# Patient Record
Sex: Male | Born: 1950
Health system: Southern US, Community
[De-identification: ages and names within clinical notes are randomized; demographics above are authoritative.]

## PROBLEM LIST (undated history)

## (undated) DIAGNOSIS — H61102 Unspecified noninfective disorders of pinna, left ear: Secondary | ICD-10-CM

## (undated) DIAGNOSIS — N186 End stage renal disease: Secondary | ICD-10-CM

## (undated) DIAGNOSIS — Z992 Dependence on renal dialysis: Secondary | ICD-10-CM

## (undated) DIAGNOSIS — Z973 Presence of spectacles and contact lenses: Secondary | ICD-10-CM

## (undated) DIAGNOSIS — N289 Disorder of kidney and ureter, unspecified: Secondary | ICD-10-CM

## (undated) DIAGNOSIS — E785 Hyperlipidemia, unspecified: Secondary | ICD-10-CM

## (undated) DIAGNOSIS — R300 Dysuria: Secondary | ICD-10-CM

## (undated) DIAGNOSIS — N400 Enlarged prostate without lower urinary tract symptoms: Secondary | ICD-10-CM

## (undated) DIAGNOSIS — I10 Essential (primary) hypertension: Secondary | ICD-10-CM

## (undated) DIAGNOSIS — E119 Type 2 diabetes mellitus without complications: Secondary | ICD-10-CM

## (undated) DIAGNOSIS — N184 Chronic kidney disease, stage 4 (severe): Secondary | ICD-10-CM

## (undated) DIAGNOSIS — N529 Male erectile dysfunction, unspecified: Secondary | ICD-10-CM

## (undated) HISTORY — DX: Nonrheumatic aortic (valve) stenosis: I35.0

---

## 1999-01-17 ENCOUNTER — Encounter
Admission: RE | Admit: 1999-01-17 | Discharge: 1999-04-17 | Payer: Self-pay | Admitting: Physical Medicine and Rehabilitation

## 1999-06-23 ENCOUNTER — Emergency Department (HOSPITAL_COMMUNITY): Admission: EM | Admit: 1999-06-23 | Discharge: 1999-06-23 | Payer: Self-pay | Admitting: Emergency Medicine

## 2001-02-11 ENCOUNTER — Encounter: Payer: Self-pay | Admitting: Endocrinology

## 2001-02-11 ENCOUNTER — Encounter: Admission: RE | Admit: 2001-02-11 | Discharge: 2001-02-11 | Payer: Self-pay | Admitting: Endocrinology

## 2014-05-20 DIAGNOSIS — N401 Enlarged prostate with lower urinary tract symptoms: Secondary | ICD-10-CM | POA: Insufficient documentation

## 2014-05-20 HISTORY — DX: Benign prostatic hyperplasia with lower urinary tract symptoms: N40.1

## 2017-03-29 ENCOUNTER — Other Ambulatory Visit: Payer: Self-pay | Admitting: Nephrology

## 2017-03-29 DIAGNOSIS — N183 Chronic kidney disease, stage 3 unspecified: Secondary | ICD-10-CM

## 2017-05-03 ENCOUNTER — Other Ambulatory Visit: Payer: Self-pay

## 2018-04-24 ENCOUNTER — Inpatient Hospital Stay (HOSPITAL_COMMUNITY): Payer: Medicare HMO

## 2018-04-24 ENCOUNTER — Emergency Department (HOSPITAL_COMMUNITY): Payer: Medicare HMO

## 2018-04-24 ENCOUNTER — Other Ambulatory Visit: Payer: Self-pay

## 2018-04-24 ENCOUNTER — Inpatient Hospital Stay (HOSPITAL_COMMUNITY)
Admission: EM | Admit: 2018-04-24 | Discharge: 2018-05-02 | DRG: 264 | Disposition: A | Discharge status: Home Health | Payer: Medicare HMO | Attending: Internal Medicine | Admitting: Internal Medicine

## 2018-04-24 ENCOUNTER — Encounter (HOSPITAL_COMMUNITY): Payer: Self-pay | Admitting: Emergency Medicine

## 2018-04-24 DIAGNOSIS — I213 ST elevation (STEMI) myocardial infarction of unspecified site: Secondary | ICD-10-CM | POA: Diagnosis not present

## 2018-04-24 DIAGNOSIS — E785 Hyperlipidemia, unspecified: Secondary | ICD-10-CM | POA: Diagnosis present

## 2018-04-24 DIAGNOSIS — E1122 Type 2 diabetes mellitus with diabetic chronic kidney disease: Secondary | ICD-10-CM

## 2018-04-24 DIAGNOSIS — N185 Chronic kidney disease, stage 5: Secondary | ICD-10-CM | POA: Diagnosis not present

## 2018-04-24 DIAGNOSIS — M7989 Other specified soft tissue disorders: Secondary | ICD-10-CM | POA: Diagnosis present

## 2018-04-24 DIAGNOSIS — I5022 Chronic systolic (congestive) heart failure: Secondary | ICD-10-CM

## 2018-04-24 DIAGNOSIS — I132 Hypertensive heart and chronic kidney disease with heart failure and with stage 5 chronic kidney disease, or end stage renal disease: Secondary | ICD-10-CM | POA: Diagnosis not present

## 2018-04-24 DIAGNOSIS — I509 Heart failure, unspecified: Secondary | ICD-10-CM

## 2018-04-24 DIAGNOSIS — I209 Angina pectoris, unspecified: Secondary | ICD-10-CM | POA: Diagnosis present

## 2018-04-24 DIAGNOSIS — I371 Nonrheumatic pulmonary valve insufficiency: Secondary | ICD-10-CM | POA: Diagnosis not present

## 2018-04-24 DIAGNOSIS — J9601 Acute respiratory failure with hypoxia: Secondary | ICD-10-CM | POA: Diagnosis not present

## 2018-04-24 DIAGNOSIS — I5020 Unspecified systolic (congestive) heart failure: Secondary | ICD-10-CM

## 2018-04-24 DIAGNOSIS — I5043 Acute on chronic combined systolic (congestive) and diastolic (congestive) heart failure: Secondary | ICD-10-CM | POA: Diagnosis not present

## 2018-04-24 DIAGNOSIS — N189 Chronic kidney disease, unspecified: Secondary | ICD-10-CM | POA: Diagnosis not present

## 2018-04-24 DIAGNOSIS — I350 Nonrheumatic aortic (valve) stenosis: Secondary | ICD-10-CM

## 2018-04-24 DIAGNOSIS — R0603 Acute respiratory distress: Secondary | ICD-10-CM | POA: Diagnosis not present

## 2018-04-24 DIAGNOSIS — I5041 Acute combined systolic (congestive) and diastolic (congestive) heart failure: Secondary | ICD-10-CM | POA: Diagnosis not present

## 2018-04-24 DIAGNOSIS — E119 Type 2 diabetes mellitus without complications: Secondary | ICD-10-CM

## 2018-04-24 DIAGNOSIS — N529 Male erectile dysfunction, unspecified: Secondary | ICD-10-CM | POA: Diagnosis present

## 2018-04-24 DIAGNOSIS — Z794 Long term (current) use of insulin: Secondary | ICD-10-CM

## 2018-04-24 DIAGNOSIS — N184 Chronic kidney disease, stage 4 (severe): Secondary | ICD-10-CM | POA: Diagnosis not present

## 2018-04-24 DIAGNOSIS — I5021 Acute systolic (congestive) heart failure: Secondary | ICD-10-CM | POA: Diagnosis not present

## 2018-04-24 DIAGNOSIS — I110 Hypertensive heart disease with heart failure: Secondary | ICD-10-CM | POA: Diagnosis not present

## 2018-04-24 DIAGNOSIS — I429 Cardiomyopathy, unspecified: Secondary | ICD-10-CM | POA: Diagnosis present

## 2018-04-24 DIAGNOSIS — I161 Hypertensive emergency: Secondary | ICD-10-CM | POA: Diagnosis not present

## 2018-04-24 DIAGNOSIS — N179 Acute kidney failure, unspecified: Secondary | ICD-10-CM | POA: Diagnosis present

## 2018-04-24 DIAGNOSIS — I272 Pulmonary hypertension, unspecified: Secondary | ICD-10-CM | POA: Diagnosis present

## 2018-04-24 DIAGNOSIS — D638 Anemia in other chronic diseases classified elsewhere: Secondary | ICD-10-CM | POA: Diagnosis present

## 2018-04-24 DIAGNOSIS — E1129 Type 2 diabetes mellitus with other diabetic kidney complication: Secondary | ICD-10-CM | POA: Diagnosis not present

## 2018-04-24 DIAGNOSIS — IMO0001 Reserved for inherently not codable concepts without codable children: Secondary | ICD-10-CM

## 2018-04-24 DIAGNOSIS — N401 Enlarged prostate with lower urinary tract symptoms: Secondary | ICD-10-CM | POA: Diagnosis present

## 2018-04-24 DIAGNOSIS — D509 Iron deficiency anemia, unspecified: Secondary | ICD-10-CM | POA: Diagnosis present

## 2018-04-24 DIAGNOSIS — I440 Atrioventricular block, first degree: Secondary | ICD-10-CM | POA: Diagnosis present

## 2018-04-24 DIAGNOSIS — Z9981 Dependence on supplemental oxygen: Secondary | ICD-10-CM

## 2018-04-24 DIAGNOSIS — I130 Hypertensive heart and chronic kidney disease with heart failure and stage 1 through stage 4 chronic kidney disease, or unspecified chronic kidney disease: Secondary | ICD-10-CM | POA: Diagnosis not present

## 2018-04-24 DIAGNOSIS — I160 Hypertensive urgency: Secondary | ICD-10-CM | POA: Diagnosis not present

## 2018-04-24 DIAGNOSIS — I340 Nonrheumatic mitral (valve) insufficiency: Secondary | ICD-10-CM | POA: Diagnosis not present

## 2018-04-24 DIAGNOSIS — N186 End stage renal disease: Secondary | ICD-10-CM | POA: Diagnosis not present

## 2018-04-24 DIAGNOSIS — R809 Proteinuria, unspecified: Secondary | ICD-10-CM | POA: Diagnosis not present

## 2018-04-24 DIAGNOSIS — R609 Edema, unspecified: Secondary | ICD-10-CM | POA: Diagnosis not present

## 2018-04-24 DIAGNOSIS — I129 Hypertensive chronic kidney disease with stage 1 through stage 4 chronic kidney disease, or unspecified chronic kidney disease: Secondary | ICD-10-CM

## 2018-04-24 DIAGNOSIS — N2581 Secondary hyperparathyroidism of renal origin: Secondary | ICD-10-CM | POA: Diagnosis not present

## 2018-04-24 DIAGNOSIS — I2582 Chronic total occlusion of coronary artery: Secondary | ICD-10-CM | POA: Diagnosis present

## 2018-04-24 DIAGNOSIS — Z992 Dependence on renal dialysis: Secondary | ICD-10-CM | POA: Diagnosis not present

## 2018-04-24 DIAGNOSIS — Z79899 Other long term (current) drug therapy: Secondary | ICD-10-CM

## 2018-04-24 DIAGNOSIS — R0602 Shortness of breath: Secondary | ICD-10-CM | POA: Diagnosis not present

## 2018-04-24 DIAGNOSIS — R079 Chest pain, unspecified: Secondary | ICD-10-CM | POA: Diagnosis not present

## 2018-04-24 DIAGNOSIS — I5031 Acute diastolic (congestive) heart failure: Secondary | ICD-10-CM | POA: Diagnosis not present

## 2018-04-24 DIAGNOSIS — E8770 Fluid overload, unspecified: Secondary | ICD-10-CM | POA: Diagnosis not present

## 2018-04-24 DIAGNOSIS — I2510 Atherosclerotic heart disease of native coronary artery without angina pectoris: Secondary | ICD-10-CM

## 2018-04-24 DIAGNOSIS — Z7982 Long term (current) use of aspirin: Secondary | ICD-10-CM

## 2018-04-24 DIAGNOSIS — Z23 Encounter for immunization: Secondary | ICD-10-CM

## 2018-04-24 DIAGNOSIS — I10 Essential (primary) hypertension: Secondary | ICD-10-CM | POA: Diagnosis not present

## 2018-04-24 HISTORY — DX: Unspecified noninfective disorders of pinna, left ear: H61.102

## 2018-04-24 HISTORY — DX: Disorder of kidney and ureter, unspecified: N28.9

## 2018-04-24 HISTORY — DX: Dysuria: R30.0

## 2018-04-24 HISTORY — DX: Chronic systolic (congestive) heart failure: I50.22

## 2018-04-24 HISTORY — DX: Male erectile dysfunction, unspecified: N52.9

## 2018-04-24 HISTORY — DX: Essential (primary) hypertension: I10

## 2018-04-24 HISTORY — DX: Benign prostatic hyperplasia without lower urinary tract symptoms: N40.0

## 2018-04-24 HISTORY — DX: Chronic kidney disease, stage 4 (severe): N18.4

## 2018-04-24 HISTORY — DX: Hypertensive heart and chronic kidney disease with heart failure and with stage 5 chronic kidney disease, or end stage renal disease: I13.2

## 2018-04-24 HISTORY — DX: Hyperlipidemia, unspecified: E78.5

## 2018-04-24 HISTORY — DX: Long term (current) use of insulin: Z79.4

## 2018-04-24 HISTORY — DX: Type 2 diabetes mellitus without complications: E11.9

## 2018-04-24 HISTORY — DX: Type 2 diabetes mellitus with diabetic chronic kidney disease: E11.22

## 2018-04-24 LAB — CBC WITH DIFFERENTIAL/PLATELET
Abs Immature Granulocytes: 0.02 10*3/uL (ref 0.00–0.07)
Basophils Absolute: 0 10*3/uL (ref 0.0–0.1)
Basophils Relative: 1 %
Eosinophils Absolute: 0.2 10*3/uL (ref 0.0–0.5)
Eosinophils Relative: 2 %
HCT: 36.8 % — ABNORMAL LOW (ref 39.0–52.0)
Hemoglobin: 11.7 g/dL — ABNORMAL LOW (ref 13.0–17.0)
Immature Granulocytes: 0 %
Lymphocytes Relative: 14 %
Lymphs Abs: 1.2 10*3/uL (ref 0.7–4.0)
MCH: 28.2 pg (ref 26.0–34.0)
MCHC: 31.8 g/dL (ref 30.0–36.0)
MCV: 88.7 fL (ref 80.0–100.0)
Monocytes Absolute: 0.6 10*3/uL (ref 0.1–1.0)
Monocytes Relative: 8 %
Neutro Abs: 6.4 10*3/uL (ref 1.7–7.7)
Neutrophils Relative %: 75 %
Platelets: 230 10*3/uL (ref 150–400)
RBC: 4.15 MIL/uL — ABNORMAL LOW (ref 4.22–5.81)
RDW: 13.6 % (ref 11.5–15.5)
WBC: 8.4 10*3/uL (ref 4.0–10.5)
nRBC: 0 % (ref 0.0–0.2)

## 2018-04-24 LAB — COMPREHENSIVE METABOLIC PANEL
ALT: 26 U/L (ref 0–44)
AST: 25 U/L (ref 15–41)
Albumin: 2.9 g/dL — ABNORMAL LOW (ref 3.5–5.0)
Alkaline Phosphatase: 86 U/L (ref 38–126)
Anion gap: 9 (ref 5–15)
BUN: 52 mg/dL — ABNORMAL HIGH (ref 8–23)
CO2: 21 mmol/L — ABNORMAL LOW (ref 22–32)
Calcium: 7.7 mg/dL — ABNORMAL LOW (ref 8.9–10.3)
Chloride: 113 mmol/L — ABNORMAL HIGH (ref 98–111)
Creatinine, Ser: 6.76 mg/dL — ABNORMAL HIGH (ref 0.61–1.24)
GFR calc Af Amer: 9 mL/min — ABNORMAL LOW (ref >60)
GFR calc non Af Amer: 8 mL/min — ABNORMAL LOW (ref >60)
Glucose, Bld: 86 mg/dL (ref 70–99)
Potassium: 3.7 mmol/L (ref 3.5–5.1)
Sodium: 143 mmol/L (ref 135–145)
Total Bilirubin: 0.4 mg/dL (ref 0.3–1.2)
Total Protein: 5.5 g/dL — ABNORMAL LOW (ref 6.5–8.1)

## 2018-04-24 LAB — CREATININE, URINE, RANDOM: Creatinine, Urine: 116.81 mg/dL

## 2018-04-24 LAB — URINALYSIS, ROUTINE W REFLEX MICROSCOPIC
Bilirubin Urine: NEGATIVE
Glucose, UA: 50 mg/dL — AB
Ketones, ur: NEGATIVE mg/dL
Leukocytes,Ua: NEGATIVE
Nitrite: NEGATIVE
Protein, ur: >=300 mg/dL — AB
Specific Gravity, Urine: 1.012 (ref 1.005–1.030)
pH: 5 (ref 5.0–8.0)

## 2018-04-24 LAB — I-STAT TROPONIN, ED: Troponin i, poc: 0.05 ng/mL (ref 0.00–0.08)

## 2018-04-24 LAB — GLUCOSE, CAPILLARY: Glucose-Capillary: 143 mg/dL — ABNORMAL HIGH (ref 70–99)

## 2018-04-24 LAB — TROPONIN I
Troponin I: 0.05 ng/mL (ref <0.03)
Troponin I: 0.06 ng/mL (ref <0.03)

## 2018-04-24 LAB — CBG MONITORING, ED: Glucose-Capillary: 83 mg/dL (ref 70–99)

## 2018-04-24 LAB — SODIUM, URINE, RANDOM: Sodium, Ur: 71 mmol/L

## 2018-04-24 LAB — MRSA PCR SCREENING: MRSA by PCR: NEGATIVE

## 2018-04-24 LAB — BRAIN NATRIURETIC PEPTIDE: B Natriuretic Peptide: 1674.1 pg/mL — ABNORMAL HIGH (ref 0.0–100.0)

## 2018-04-24 LAB — TSH: TSH: 3.155 u[IU]/mL (ref 0.350–4.500)

## 2018-04-24 MED ORDER — INSULIN ASPART 100 UNIT/ML ~~LOC~~ SOLN: 0.0000 [IU] | Freq: Three times a day (TID) | SUBCUTANEOUS | Status: DC

## 2018-04-24 MED ORDER — PNEUMOCOCCAL VAC POLYVALENT 25 MCG/0.5ML IJ INJ
0.5000 mL | INJECTION | INTRAMUSCULAR | Status: AC
  Administered 2018-04-25: 0.5 mL via INTRAMUSCULAR
  Filled 2018-04-24: qty 0.5

## 2018-04-24 MED ORDER — NITROGLYCERIN 0.4 MG SL SUBL
0.4000 mg | SUBLINGUAL_TABLET | Freq: Once | SUBLINGUAL | Status: AC
  Administered 2018-04-24: 0.4 mg via SUBLINGUAL

## 2018-04-24 MED ORDER — HYDRALAZINE HCL 20 MG/ML IJ SOLN
10.0000 mg | Freq: Once | INTRAMUSCULAR | Status: AC
  Administered 2018-04-24: 10 mg via INTRAVENOUS
  Filled 2018-04-24: qty 1

## 2018-04-24 MED ORDER — NITROGLYCERIN 0.4 MG SL SUBL
SUBLINGUAL_TABLET | SUBLINGUAL | Status: AC
  Filled 2018-04-24: qty 3

## 2018-04-24 MED ORDER — HEPARIN SODIUM (PORCINE) 5000 UNIT/ML IJ SOLN
5000.0000 [IU] | Freq: Three times a day (TID) | INTRAMUSCULAR | Status: DC
  Administered 2018-04-24 – 2018-04-28 (×10): 5000 [IU] via SUBCUTANEOUS
  Filled 2018-04-24 (×10): qty 1

## 2018-04-24 MED ORDER — ISOSORB DINITRATE-HYDRALAZINE 20-37.5 MG PO TABS
1.0000 | ORAL_TABLET | Freq: Three times a day (TID) | ORAL | Status: DC
  Administered 2018-04-24 – 2018-05-02 (×21): 1 via ORAL
  Filled 2018-04-24 (×21): qty 1

## 2018-04-24 MED ORDER — LISINOPRIL 10 MG PO TABS
10.0000 mg | ORAL_TABLET | Freq: Once | ORAL | Status: AC
  Administered 2018-04-24: 10 mg via ORAL
  Filled 2018-04-24: qty 1

## 2018-04-24 MED ORDER — SODIUM CHLORIDE 0.9 % IV SOLN
250.0000 mL | INTRAVENOUS | Status: DC | PRN
  Administered 2018-04-30: 250 mL via INTRAVENOUS

## 2018-04-24 MED ORDER — INSULIN ASPART 100 UNIT/ML ~~LOC~~ SOLN
0.0000 [IU] | Freq: Three times a day (TID) | SUBCUTANEOUS | Status: DC
  Administered 2018-04-26: 1 [IU] via SUBCUTANEOUS
  Administered 2018-04-27: 2 [IU] via SUBCUTANEOUS
  Administered 2018-04-27 – 2018-05-01 (×3): 1 [IU] via SUBCUTANEOUS

## 2018-04-24 MED ORDER — HYDRALAZINE HCL 20 MG/ML IJ SOLN
10.0000 mg | INTRAMUSCULAR | Status: DC | PRN
  Administered 2018-04-24: 10 mg via INTRAVENOUS
  Filled 2018-04-24: qty 1

## 2018-04-24 MED ORDER — NITROGLYCERIN 0.4 MG SL SUBL
0.8000 mg | SUBLINGUAL_TABLET | Freq: Once | SUBLINGUAL | Status: AC
  Administered 2018-04-24: 0.8 mg via SUBLINGUAL

## 2018-04-24 MED ORDER — ONDANSETRON HCL 4 MG/2ML IJ SOLN: 4.0000 mg | Freq: Four times a day (QID) | INTRAMUSCULAR | Status: DC | PRN

## 2018-04-24 MED ORDER — SODIUM CHLORIDE 0.9% FLUSH
3.0000 mL | Freq: Two times a day (BID) | INTRAVENOUS | Status: DC
  Administered 2018-04-24 – 2018-05-01 (×12): 3 mL via INTRAVENOUS

## 2018-04-24 MED ORDER — LIVING BETTER WITH HEART FAILURE BOOK: Freq: Once | Status: DC

## 2018-04-24 MED ORDER — SODIUM CHLORIDE 0.9% FLUSH
3.0000 mL | INTRAVENOUS | Status: DC | PRN
  Administered 2018-04-24: 3 mL via INTRAVENOUS
  Filled 2018-04-24: qty 3

## 2018-04-24 MED ORDER — INSULIN ASPART 100 UNIT/ML ~~LOC~~ SOLN: 0.0000 [IU] | Freq: Every day | SUBCUTANEOUS | Status: DC

## 2018-04-24 MED ORDER — INSULIN ASPART PROT & ASPART (70-30 MIX) 100 UNIT/ML ~~LOC~~ SUSP
20.0000 [IU] | Freq: Every day | SUBCUTANEOUS | Status: DC
  Administered 2018-04-25 – 2018-05-02 (×5): 20 [IU] via SUBCUTANEOUS
  Filled 2018-04-24 (×3): qty 10

## 2018-04-24 MED ORDER — ROSUVASTATIN CALCIUM 5 MG PO TABS: 5.0000 mg | ORAL_TABLET | Freq: Every day | ORAL | Status: DC

## 2018-04-24 MED ORDER — ACETAMINOPHEN 325 MG PO TABS
650.0000 mg | ORAL_TABLET | ORAL | Status: DC | PRN
  Administered 2018-04-29: 650 mg via ORAL
  Filled 2018-04-24: qty 2

## 2018-04-24 MED ORDER — FUROSEMIDE 10 MG/ML IJ SOLN
40.0000 mg | Freq: Two times a day (BID) | INTRAMUSCULAR | Status: DC
  Administered 2018-04-24 – 2018-04-28 (×8): 40 mg via INTRAVENOUS
  Filled 2018-04-24 (×8): qty 4

## 2018-04-24 NOTE — Plan of Care (Signed)

## 2018-04-24 NOTE — H&P (Signed)
History and Physical    SHANKAR SILBER GLO:[id number] DOB: Mar 14, 1950 DOA: 04/24/2018  Referring MD/NP/PA: Benedetto Goad, PA-C PCP: Reynold Bowen, MD  Patient coming from: PCP of  Chief Complaint: Shortness of breath  I have personally briefly reviewed patient's old medical records in [place]   HPI: Justin [name] is a 68 y.o. male with medical history significant of HTN, CKD stage IV, insulin-dependent diabetes mellitus type 2; who presents with complaints of progressively worsening shortness of breath over the last week.  He reports that his weight is been stable, but in the last 2 days he is noticed his lower legs have been swelling.  Endorses shortness of breath worsening with exertion or trying to lie flat.  Patient is followed by nephrology, but does not know the name.  Blood pressure medications include lisinopril.  He has not been on any diuretics.  Other associated symptoms include complaints of chest discomfort worsened with activity.  Works as a Psychologist, sport and exercise.  Denies change in his weight, nausea, vomiting, diarrhea, fever, chills, history of heart failure, or any known recent sick contacts.  Patient notes previously being told about his kidney function for home he has been seen by nephrologist, but cannot recall their name.    ED Course: While in the emergency department patient was noted to have blood pressures elevated to 214/99 with tachypnea.  He was placed on 4 L nasal cannula oxygen you with some improvement respiratory distress.  Given nitroglycerin, lisinopril 10 mg, and hydralazine IV with little improvement in pressures.  Chest x-ray showing interstitial edema and pleural effusions consistent with congestive heart failure.  BNP elevated at 1674.1, creatinine 6.76(baseline previously 3.1).  TRH called to admit.  Review of Systems  Constitutional: Negative for fever and weight loss.  HENT: Negative for congestion and sore throat.   Eyes: Negative for photophobia and pain.   Respiratory: Positive for shortness of breath. Negative for hemoptysis.   Cardiovascular: Positive for chest pain, orthopnea and leg swelling.  Gastrointestinal: Negative for abdominal pain, diarrhea, nausea and vomiting.  Genitourinary: Negative for dysuria and hematuria.  Musculoskeletal: Negative for falls and myalgias.  Skin: Negative for itching.  Neurological: Negative for focal weakness and loss of consciousness.  Psychiatric/Behavioral: Negative for memory loss and substance abuse.    Past Medical History:  Diagnosis Date  . BPH (benign prostatic hyperplasia)   . Chronic kidney disease (CKD), stage IV (severe) ([place])   . Diabetes ([place])   . Dysuria   . Erectile dysfunction   . Hyperlipemia   . Hypertension   . Pinna disorder, left   . Renal disorder     History reviewed. No pertinent surgical history.   reports that he has never smoked. He has never used smokeless tobacco. He reports previous alcohol use. He reports that he does not use drugs.  No Known Allergies  History reviewed. No pertinent family history.  Prior to Admission medications   Not on File    Physical Exam:  Constitutional: Obese male who appears to be in some respiratory distress Vitals:   04/24/18 1530 04/24/18 1533 04/24/18 1538 04/24/18 1540  BP: (!) 210/113 (!) 180/101 (!) 180/101 (!) 180/101  Pulse: 94 90    Resp: (!) 30 (!) 27    Temp:      TempSrc:      SpO2: 100% 100%    Weight:      Height:       Eyes: PERRL, lids and conjunctivae normal ENMT: Mucous  membranes are moist. Posterior pharynx clear of any exudate or lesions.Normal dentition.  Neck: normal, supple, no masses, no thyromegaly.  Positive JVD Respiratory: Tachypneic with positive crackles heard throughout both lung fields.  Patient on 4 L nasal cannula oxygen Cardiovascular: Regular rate and rhythm, no murmurs / rubs / gallops.  2+ pitting bilateral lower extremity edema. 2+ pedal pulses. No carotid bruits.  Abdomen: no  tenderness, no masses palpated. No hepatosplenomegaly. Bowel sounds positive.  Musculoskeletal: no clubbing / cyanosis. No joint deformity upper and lower extremities. Good ROM, no contractures. Normal muscle tone.  Skin: no rashes, lesions, ulcers. No induration Neurologic: CN 2-12 grossly intact. Sensation intact, DTR normal. Strength 5/5 in all 4.  Psychiatric: Normal judgment and insight. Alert and oriented x 3. Normal mood.     Labs on Admission: I have personally reviewed following labs and imaging studies  CBC: Recent Labs  Lab 04/24/18 1433  WBC 8.4  NEUTROABS 6.4  HGB 11.7*  HCT 36.8*  MCV 88.7  PLT 270   Basic Metabolic Panel: Recent Labs  Lab 04/24/18 1433  NA 143  K 3.7  CL 113*  CO2 21*  GLUCOSE 86  BUN 52*  CREATININE 6.76*  CALCIUM 7.7*   GFR: Estimated Creatinine Clearance: 11.5 mL/min (A) (by C-G formula based on SCr of 6.76 mg/dL (H)). Liver Function Tests: Recent Labs  Lab 04/24/18 1433  AST 25  ALT 26  ALKPHOS 86  BILITOT 0.4  PROT 5.5*  ALBUMIN 2.9*   No results for input(s): LIPASE, AMYLASE in the last 168 hours. No results for input(s): AMMONIA in the last 168 hours. Coagulation Profile: No results for input(s): INR, PROTIME in the last 168 hours. Cardiac Enzymes: No results for input(s): CKTOTAL, CKMB, CKMBINDEX, TROPONINI in the last 168 hours. BNP (last 3 results) No results for input(s): PROBNP in the last 8760 hours. HbA1C: No results for input(s): HGBA1C in the last 72 hours. CBG: Recent Labs  Lab 04/24/18 1418  GLUCAP 83   Lipid Profile: No results for input(s): CHOL, HDL, LDLCALC, TRIG, CHOLHDL, LDLDIRECT in the last 72 hours. Thyroid Function Tests: No results for input(s): TSH, T4TOTAL, FREET4, T3FREE, THYROIDAB in the last 72 hours. Anemia Panel: No results for input(s): VITAMINB12, FOLATE, FERRITIN, TIBC, IRON, RETICCTPCT in the last 72 hours. Urine analysis: No results found for: COLORURINE, APPEARANCEUR,  LABSPEC, PHURINE, GLUCOSEU, HGBUR, BILIRUBINUR, KETONESUR, PROTEINUR, UROBILINOGEN, NITRITE, LEUKOCYTESUR Sepsis Labs: No results found for this or any previous visit (from the past 240 hour(s)).   Radiological Exams on Admission: Dg Chest Port 1 View  Result Date: 04/24/2018 CLINICAL DATA:  Chest pain and shortness of breath. Bilateral lower extremity swelling. EXAM: PORTABLE CHEST 1 VIEW COMPARISON:  None. FINDINGS: Mild cardiomegaly. Atherosclerotic calcification of the aortic arch. Pulmonary vascular congestion and diffuse interstitial thickening. Small right greater than left pleural effusions and right greater than left basilar atelectasis. No pneumothorax. No acute osseous abnormality. IMPRESSION: 1. Cardiomegaly with mild interstitial pulmonary edema and small bilateral pleural effusions, consistent with congestive heart failure. 2.  Aortic atherosclerosis (ICD10-I70.0). Electronically Signed   By: Titus Dubin M.D.   On: 04/24/2018 14:57    EKG: Independently reviewed.  Sinus atrial rhythm with QTc 478  Assessment/Plan Acute respiratory distress 2/2 CHF exacerbation: Presents with complaints of shortness of breath and lower extremity swelling over the last week.  Patient with 2+ pitting lower extremity edema and crackles on lung exam.  BNP elevated at 1674 .1 and chest x-ray showing interstitial edema.  Patient with no prior history of heart disease. - Admit to a telemetry bed - Heart failure orders set  initiated  - Continuous pulse oximetry with nasal cannula oxygen as needed to keep O2 saturations >92% -Strict I&Os and daily weights -Elevate lower extremities -Check TSH -Lasix 40 mg IV Bid -Reassess in a.m. and adjust diuresis as needed. -Check echocardiogram -Place Foley catheter for strict intake and output -May warrant consultation to cardiology in a.m.   Hypertensive urgency: On admission patient's blood pressure elevated 210/113.  Patient had been given nitroglycerin and  lisinopril in the ED.   -Hold lisinopril due to acute kidney injury -Hydralazine IV as needed -Nephrology recommended starting isosorbide -hydralazine 20-37.5 mg 3 times daily  Acute renal failure superimposed on chronic kidney disease stage IV: Previous noted to have creatinine around 3.1, but presents with creatinine 6.76 with BUN 32.  Suspect acute rise could be related with hypoperfusion related with heart failure. -Check urinalysis -Check FeNa -Check Renal ultrasound -Daily BMP -Appreciate nephrology consultative services -Follow-up protein /creatinine ratio  Insulin-dependent diabetes mellitus: Patient normally takes Novolin 70/30 insulin 40 units every morning. -Hypoglycemic protocols -Check hemoglobin A1c -Reduced 70/30 insulin to 20 units daily while in hospital -CBGs q. before meals with sensitive sliding scale insulin -Adjust regimen as needed  Hyperlipidemia -Check lipid panel -Continue Crestor   DVT prophylaxis: heparin   Code Status: full  Family Communication: No family present at bedside Disposition Plan: likely discharge home in 2-3 days  Consults called:  Admission status: inpatient   Norval Morton MD [facility] Pager [phone]   If 7PM-7AM, please contact night-coverage [email or web address] Password [email or web address]  04/24/2018, 4:10 PM

## 2018-04-24 NOTE — ED Triage Notes (Addendum)
Pt arrives to ED from his MD's office with complaints of dyspnea on exertion and dyspnea laying flat for the past three to four days. EMS reports pt started having chest pains this week that wakes up him at night. Pt states hes currently pain free.

## 2018-04-24 NOTE — ED Notes (Signed)
ED TO INPATIENT HANDOFF REPORT  ED Nurse Name and Phone #: Ardelle Park RN   S Name/Age/Gender Justin [name] 68 y.o. male Room/Bed: 031C/031C  Code Status   Code Status: Full Code  Home/SNF/Other Home Patient oriented to: situation Is this baseline? Yes   Triage Complete: Triage complete  Chief Complaint SOB; CHF  Triage Note Pt arrives to ED from his MD's office with complaints of dyspnea on exertion and dyspnea laying flat for the past three to four days. EMS reports pt started having chest pains this week that wakes up him at night. Pt states hes currently pain free.    Allergies No Known Allergies  Level of Care/Admitting Diagnosis ED Disposition    ED Disposition Condition Comment   Admit  Hospital Area: [facility] [[id number]]  Level of Care: Progressive [102]  Covid Evaluation: N/A  Diagnosis: Acute exacerbation of CHF (congestive heart failure) [facility]) [[id number]]  Admitting Physician: Norval Morton [[id number]]  Attending Physician: Norval Morton [[id number]]  Estimated length of stay: 3 - 4 days  Certification:: I certify this patient will need inpatient services for at least 2 midnights  PT Class (Do Not Modify): Inpatient [101]  PT Acc Code (Do Not Modify): Private [1]       B Medical/Surgery History Past Medical History:  Diagnosis Date  . BPH (benign prostatic hyperplasia)   . Chronic kidney disease (CKD), stage IV (severe) ([place])   . Diabetes ([place])   . Dysuria   . Erectile dysfunction   . Hyperlipemia   . Hypertension   . Pinna disorder, left   . Renal disorder    History reviewed. No pertinent surgical history.   A IV Location/Drains/Wounds Patient Lines/Drains/Airways Status   Active Line/Drains/Airways    Name:   Placement date:   Placement time:   Site:   Days:   Peripheral IV 04/24/18 Left Antecubital   04/24/18    1428    Antecubital   less than 1          Intake/Output Last 24 hours No intake or output data in  the 24 hours ending 04/24/18 1653  Labs/Imaging Results for orders placed or performed during the hospital encounter of 04/24/18 (from the past 48 hour(s))  CBG monitoring, ED     Status: None   Collection Time: 04/24/18  2:18 PM  Result Value Ref Range   Glucose-Capillary 83 70 - 99 mg/dL   Comment 1 Notify RN    Comment 2 Document in Chart   Comprehensive metabolic panel     Status: Abnormal   Collection Time: 04/24/18  2:33 PM  Result Value Ref Range   Sodium 143 135 - 145 mmol/L   Potassium 3.7 3.5 - 5.1 mmol/L   Chloride 113 (H) 98 - 111 mmol/L   CO2 21 (L) 22 - 32 mmol/L   Glucose, Bld 86 70 - 99 mg/dL   BUN 52 (H) 8 - 23 mg/dL   Creatinine, Ser 6.76 (H) 0.61 - 1.24 mg/dL   Calcium 7.7 (L) 8.9 - 10.3 mg/dL   Total Protein 5.5 (L) 6.5 - 8.1 g/dL   Albumin 2.9 (L) 3.5 - 5.0 g/dL   AST 25 15 - 41 U/L   ALT 26 0 - 44 U/L   Alkaline Phosphatase 86 38 - 126 U/L   Total Bilirubin 0.4 0.3 - 1.2 mg/dL   GFR calc non Af Amer 8 (L) >60 mL/min   GFR calc Af Amer 9 (L) >60  mL/min   Anion gap 9 5 - 15    Comment: Performed at [facility] [address] [place], [place] [address]  CBC with Differential     Status: Abnormal   Collection Time: 04/24/18  2:33 PM  Result Value Ref Range   WBC 8.4 4.0 - 10.5 K/uL   RBC 4.15 (L) 4.22 - 5.81 MIL/uL   Hemoglobin 11.7 (L) 13.0 - 17.0 g/dL   HCT 36.8 (L) 39.0 - 52.0 %   MCV 88.7 80.0 - 100.0 fL   MCH 28.2 26.0 - 34.0 pg   MCHC 31.8 30.0 - 36.0 g/dL   RDW 13.6 11.5 - 15.5 %   Platelets 230 150 - 400 K/uL   nRBC 0.0 0.0 - 0.2 %   Neutrophils Relative % 75 %   Neutro Abs 6.4 1.7 - 7.7 K/uL   Lymphocytes Relative 14 %   Lymphs Abs 1.2 0.7 - 4.0 K/uL   Monocytes Relative 8 %   Monocytes Absolute 0.6 0.1 - 1.0 K/uL   Eosinophils Relative 2 %   Eosinophils Absolute 0.2 0.0 - 0.5 K/uL   Basophils Relative 1 %   Basophils Absolute 0.0 0.0 - 0.1 K/uL   Immature Granulocytes 0 %   Abs Immature Granulocytes 0.02 0.00 - 0.07 K/uL     Comment: Performed at [facility] [address], [place], [place] [address]  Brain natriuretic peptide     Status: Abnormal   Collection Time: 04/24/18  2:34 PM  Result Value Ref Range   B Natriuretic Peptide 1,674.1 (H) 0.0 - 100.0 pg/mL    Comment: Performed at [facility] [address], [place], [place] [address]  I-stat troponin, ED     Status: None   Collection Time: 04/24/18  2:55 PM  Result Value Ref Range   Troponin i, poc 0.05 0.00 - 0.08 ng/mL   Comment 3            Comment: Due to the release kinetics of cTnI, a negative result within the first hours of the onset of symptoms does not rule out myocardial infarction with certainty. If myocardial infarction is still suspected, repeat the test at appropriate intervals.    Dg Chest Port 1 View  Result Date: 04/24/2018 CLINICAL DATA:  Chest pain and shortness of breath. Bilateral lower extremity swelling. EXAM: PORTABLE CHEST 1 VIEW COMPARISON:  None. FINDINGS: Mild cardiomegaly. Atherosclerotic calcification of the aortic arch. Pulmonary vascular congestion and diffuse interstitial thickening. Small right greater than left pleural effusions and right greater than left basilar atelectasis. No pneumothorax. No acute osseous abnormality. IMPRESSION: 1. Cardiomegaly with mild interstitial pulmonary edema and small bilateral pleural effusions, consistent with congestive heart failure. 2.  Aortic atherosclerosis (ICD10-I70.0). Electronically Signed   By: Titus Dubin M.D.   On: 04/24/2018 14:57    Pending Labs Unresulted Labs (From admission, onward)    Start     Ordered   04/25/18 7703  Basic metabolic panel  Daily,   R     04/24/18 1631   04/25/18 0500  CBC WITH DIFFERENTIAL  Tomorrow morning,   R     04/24/18 1631   04/24/18 1635  Troponin I - Now Then Q6H  Now then every 6 hours,   R     04/24/18 1635   04/24/18 1631  Creatinine, urine, random  ONCE - STAT,   R     04/24/18 1631   04/24/18 1630  Sodium,  urine,  random  ONCE - STAT,   R     04/24/18 1631   04/24/18 1629  TSH  Add-on,   R     04/24/18 1631   04/24/18 1626  HIV antibody (Routine Testing)  Once,   R     04/24/18 1631          Vitals/Pain Today's Vitals   04/24/18 1533 04/24/18 1538 04/24/18 1540 04/24/18 1543  BP: (!) 180/101 (!) 180/101 (!) 180/101   Pulse: 90     Resp: (!) 27     Temp:      TempSrc:      SpO2: 100%     Weight:      Height:      PainSc:    0-No pain    Isolation Precautions No active isolations  Medications Medications  nitroGLYCERIN (NITROSTAT) 0.4 MG SL tablet (has no administration in time range)  furosemide (LASIX) injection 40 mg (has no administration in time range)  sodium chloride flush (NS) 0.9 % injection 3 mL (has no administration in time range)  sodium chloride flush (NS) 0.9 % injection 3 mL (has no administration in time range)  0.9 %  sodium chloride infusion (has no administration in time range)  acetaminophen (TYLENOL) tablet 650 mg (has no administration in time range)  ondansetron (ZOFRAN) injection 4 mg (has no administration in time range)  heparin injection 5,000 Units (has no administration in time range)  nitroGLYCERIN (NITROSTAT) SL tablet 0.8 mg (0.8 mg Sublingual Given 04/24/18 1529)  hydrALAZINE (APRESOLINE) injection 10 mg (10 mg Intravenous Given 04/24/18 1538)  lisinopril (ZESTRIL) tablet 10 mg (10 mg Oral Given 04/24/18 1540)  nitroGLYCERIN (NITROSTAT) SL tablet 0.4 mg (0.4 mg Sublingual Given 04/24/18 1534)    Mobility walks Low fall risk   Focused Assessments Respiratory   R Recommendations: See Admitting Provider Note  Report given to:   Additional Notes: Pt has extreme shortness of breath with any exertion. Pt stood up to urinate and had a hard time calming back down.

## 2018-04-24 NOTE — ED Notes (Signed)
Pt CBG was 83, notified [place](RN)

## 2018-04-24 NOTE — ED Provider Notes (Signed)
[facility] EMERGENCY DEPARTMENT Provider Note   CSN: [id number] Arrival date & time: 04/24/18  1409    History   Chief Complaint Chief Complaint  Patient presents with  . Shortness of Breath  . Chest Pain    HPI Justin [name] is a 68 y.o. male.     Justin [name] is a 68 y.o. male with a history of hypertension, hyperlipidemia, diabetes, CKD and BPH, who presents to the emergency department from his primary care doctor's office for evaluation of 1 week of exertional dyspnea and chest pain.  He reports that when he is up moving around and lifting things, [place] works [place].  Over the past week he has noticed that he has become increasingly short of breath and will intermittently develop pain across the center of his chest.  When he sits down and rests this seems to improve.  He is also noted over the past 4 days swelling in both of his lower extremities.  He has had an occasional cough which is fairly chronic for him, but has not had any sputum production.  He has not had any fevers or chills.  No nasal congestion, sore throat or rhinorrhea.  He reports that he takes all of his medications regularly as he supposed to, and took lisinopril for his blood pressure this morning, was noted to be hypertensive at 201/106 on arrival.  He denies history of any heart problems, no previous MI, has never seen a cardiologist.  He has not had any nominal pain nausea vomiting or diarrhea.  Reports some urinary frequency, primarily at night but no other urinary symptoms.  Denies headaches, numbness weakness, facial asymmetry or changes in speech.  No dizziness.  No recent sick contacts or travel.     Past Medical History:  Diagnosis Date  . BPH (benign prostatic hyperplasia)   . Chronic kidney disease (CKD), stage IV (severe) ([place])   . Diabetes ([place])   . Dysuria   . Erectile dysfunction   . Hyperlipemia   . Hypertension   . Pinna disorder, left   . Renal disorder      There are no active problems to display for this patient.   History reviewed. No pertinent surgical history.      Home Medications    Prior to Admission medications   Not on File    Family History History reviewed. No pertinent family history.  Social History Social History   Tobacco Use  . Smoking status: Never Smoker  . Smokeless tobacco: Never Used  Substance Use Topics  . Alcohol use: Not Currently  . Drug use: Never     Allergies   Patient has no known allergies.   Review of Systems Review of Systems  Constitutional: Negative for chills and fever.  HENT: Negative.   Eyes: Negative for visual disturbance.  Respiratory: Positive for shortness of breath. Negative for cough and chest tightness.   Cardiovascular: Positive for chest pain and leg swelling. Negative for palpitations.  Gastrointestinal: Negative for abdominal pain, nausea and vomiting.  Genitourinary: Positive for frequency. Negative for dysuria.  Musculoskeletal: Negative for arthralgias and joint swelling.  Skin: Negative for color change and rash.  Neurological: Negative for dizziness, syncope, weakness, light-headedness, numbness and headaches.     Physical Exam Updated Vital Signs BP (!) 201/106 (BP Location: Right Arm)   Pulse 74   Temp 98.1 F (36.7 C) (Oral)   Resp (!) 30   Ht 5\' 9"  (  1.753 m)   Wt 85.3 kg   SpO2 100%   BMI 27.76 kg/m   Physical Exam Vitals signs and nursing note reviewed.  Constitutional:      General: He is not in acute distress.    Appearance: He is well-developed. He is obese. He is ill-appearing. He is not toxic-appearing or diaphoretic.     Comments: Patient is alert and oriented, he is mildly tachypneic with some increased work of breathing, somewhat ill-appearing.  HENT:     Head: Normocephalic and atraumatic.     Mouth/Throat:     Mouth: Mucous membranes are moist.     Pharynx: Oropharynx is clear.  Eyes:     General:        Right eye: No  discharge.        Left eye: No discharge.     Pupils: Pupils are equal, round, and reactive to light.  Neck:     Musculoskeletal: Neck supple.  Cardiovascular:     Rate and Rhythm: Normal rate and regular rhythm.     Pulses:          Radial pulses are 2+ on the right side and 2+ on the left side.       Dorsalis pedis pulses are 2+ on the right side and 2+ on the left side.     Heart sounds: Normal heart sounds. No murmur. No friction rub. No gallop.   Pulmonary:     Effort: Tachypnea present. No respiratory distress.     Breath sounds: Rales present. No wheezing.     Comments: On arrival patient is tachypneic, breathing 30 times per minute with some increased respiratory effort but satting well on room air.  He is able to speak in full sentences.  Lungs with some crackles in bilateral bases, otherwise clear to auscultation, no wheezes or rhonchi. Chest:     Chest wall: No tenderness.  Abdominal:     General: Bowel sounds are normal. There is no distension.     Palpations: Abdomen is soft. There is no mass.     Tenderness: There is no abdominal tenderness. There is no guarding.  Musculoskeletal:        General: No deformity.     Right lower leg: Edema present.     Left lower leg: Edema present.     Comments: 2+ pitting edema of bilateral lower extremities up to the level of the knee.  Skin:    General: Skin is warm and dry.     Capillary Refill: Capillary refill takes less than 2 seconds.  Neurological:     Mental Status: He is alert.     Coordination: Coordination normal.     Comments: Speech is clear, able to follow commands Moves extremities without ataxia, coordination intact  Psychiatric:        Mood and Affect: Mood normal.        Behavior: Behavior normal.      ED Treatments / Results  Labs (all labs ordered are listed, but only abnormal results are displayed) Labs Reviewed  COMPREHENSIVE METABOLIC PANEL - Abnormal; Notable for the following components:      Result  Value   Chloride 113 (*)    CO2 21 (*)    BUN 52 (*)    Creatinine, Ser 6.76 (*)    Calcium 7.7 (*)    Total Protein 5.5 (*)    Albumin 2.9 (*)    GFR calc non Af Amer 8 (*)  GFR calc Af Amer 9 (*)    All other components within normal limits  CBC WITH DIFFERENTIAL/PLATELET - Abnormal; Notable for the following components:   RBC 4.15 (*)    Hemoglobin 11.7 (*)    HCT 36.8 (*)    All other components within normal limits  BRAIN NATRIURETIC PEPTIDE - Abnormal; Notable for the following components:   B Natriuretic Peptide 1,674.1 (*)    All other components within normal limits  CBG MONITORING, ED  I-STAT TROPONIN, ED    EKG EKG Interpretation  Date/Time:  Wednesday April 24 2018 14:15:14 EDT Ventricular Rate:  84 PR Interval:    QRS Duration: 102 QT Interval:  404 QTC Calculation: 478 R Axis:   -80 Text Interpretation:  Sinus or ectopic atrial rhythm LAD, consider left anterior fascicular block Borderline repolarization abnormality Borderline prolonged QT interval No old tracing to compare Confirmed by Jola Schmidt [phone]) on 04/24/2018 3:26:24 PM   Radiology Dg Chest Port 1 View  Result Date: 04/24/2018 CLINICAL DATA:  Chest pain and shortness of breath. Bilateral lower extremity swelling. EXAM: PORTABLE CHEST 1 VIEW COMPARISON:  None. FINDINGS: Mild cardiomegaly. Atherosclerotic calcification of the aortic arch. Pulmonary vascular congestion and diffuse interstitial thickening. Small right greater than left pleural effusions and right greater than left basilar atelectasis. No pneumothorax. No acute osseous abnormality. IMPRESSION: 1. Cardiomegaly with mild interstitial pulmonary edema and small bilateral pleural effusions, consistent with congestive heart failure. 2.  Aortic atherosclerosis (ICD10-I70.0). Electronically Signed   By: Titus Dubin M.D.   On: 04/24/2018 14:57    Procedures Procedures (including critical care time)  Medications Ordered in ED Medications   nitroGLYCERIN (NITROSTAT) 0.4 MG SL tablet (has no administration in time range)  nitroGLYCERIN (NITROSTAT) SL tablet 0.8 mg (0.8 mg Sublingual Given 04/24/18 1529)  hydrALAZINE (APRESOLINE) injection 10 mg (10 mg Intravenous Given 04/24/18 1538)  lisinopril (ZESTRIL) tablet 10 mg (10 mg Oral Given 04/24/18 1540)  nitroGLYCERIN (NITROSTAT) SL tablet 0.4 mg (0.4 mg Sublingual Given 04/24/18 1534)     Initial Impression / Assessment and Plan / ED Course  I have reviewed the triage vital signs and the nursing notes.  Pertinent labs & imaging results that were available during my care of the patient were reviewed by me and considered in my medical decision making (see chart for details).  Patient sent from PCP for 1 week of exertional dyspnea and chest pain.  On arrival patient noted to be tachypneic with increased work of breathing and very hypertensive with blood pressure of 201/106.  Currently chest pain-free.  Patient also noted to have 2+ pitting edema of bilateral lower extremities.  Presentation concerning for new onset CHF, no prior cardiac history.  Long history of hypertension.  With exertional chest pain, concerning for potential unstable angina versus hypertensive emergency.  Will check basic labs, EKG, troponin, BNP, and chest x-ray.  EKG with sinus rhythm, no ST changes or significant change when compared to prior tracing.  Initial troponin negative.  Chest x-ray shows cardiomegaly with bibasilar interstitial pulmonary edema with bilateral small pleural effusions consistent with CHF.  Called to bedside by nursing after patient stood up to urinate he had significantly increased work of breathing, tachypneic up to 40 RR/min, increased oxygen on nasal cannula and patient was given 3 sublingual nitro tablets, and had significant improvement in work of breathing and blood pressure improved as well.   Labs significant for creatinine of 6.76 suggestive of acute renal failure, no previous  creatinine available  for comparison, patient does have known history of chronic renal insufficiency.  BUN slightly elevated at 52.  Normal potassium, hypocalcemia of 7.7.  No other significant electrolyte derangements requiring intervention.  Normal liver function.  No leukocytosis, hemoglobin of 11.7.  BNP is significantly elevated at 1674 consistent with suspected CHF.  Given acute renal failure and new onset CHF this is more likely due to hypertensive emergency rather than unstable angina and given patient's acute renal failure he is not a candidate for early heart cath.  Work of breathing improving with treatment of patient's blood pressure.  Patient will need admission for further work-up and treatment of hypertensive emergency.  Patient does also reports some urinary frequency, bladder scan ordered to rule out urinary outlet obstruction as cause for acute renal failure.  Patient was able to urinate here in the ED.  Will hold on Lasix given elevated creatinine.  Patient discussed with Dr. Venora Maples, who saw patient as well and agrees with plan.  Will consult for medical admission for hypertensive emergency with new onset heart failure and acute renal failure.  Blood pressure has improved to 180/101 with nitro.  Case discussed with Dr. Fuller Plan with [facility] hospitalist who will see and admit the patient.  Final Clinical Impressions(s) / ED Diagnoses   Final diagnoses:  Hypertensive emergency  Acute congestive heart failure, unspecified heart failure type ([place])  Acute renal failure, unspecified acute renal failure type [facility])    ED Discharge Orders    None       Janet Berlin 04/24/18 1645    Jola Schmidt, MD 04/25/18 [phone]

## 2018-04-24 NOTE — Consult Note (Signed)
[facility]  INPATIENT CONSULTATION  Reason for Consultation: AKI on CKD Requesting Provider: Dr. Tamala Julian  HPI: Justin [name] is an 68 y.o. male with BPH, CKD 4 (BL Cr reported 3.1), DM, HL who presents with dyspnea and edema.  Nephrology is consulted for evaluation and management of AKI on CKD.   Pt was in his usual state of health until the last few days noted increasing DOE, LE edema, orthopnea.  Went for PCP visit today and was noted to be hypertensive, edematous and sent to ED.  In ED labs show Cr 6.7, proBNP 1674 (no priors).  CXR with edema and small effusions.  Trop 0.05.  He is admitted for IV diuresis.   He notes no recent med changes, no dietary indiscretion of sodium.  No difficulty voiding but past 2 nights had nocturia q1hr.  No dysgeusia, hiccups, pruritis, difficulty with cognition.  PMH: Past Medical History:  Diagnosis Date  . BPH (benign prostatic hyperplasia)   . Chronic kidney disease (CKD), stage IV (severe) ([place])   . Diabetes ([place])   . Dysuria   . Erectile dysfunction   . Hyperlipemia   . Hypertension   . Pinna disorder, left   . Renal disorder    PSH: History reviewed. No pertinent surgical history.   Past Medical History:  Diagnosis Date  . BPH (benign prostatic hyperplasia)   . Chronic kidney disease (CKD), stage IV (severe) ([place])   . Diabetes ([place])   . Dysuria   . Erectile dysfunction   . Hyperlipemia   . Hypertension   . Pinna disorder, left   . Renal disorder     Medications:  I have reviewed the patient's current medications.  Medications Prior to Admission  Medication Sig Dispense Refill  . lisinopril (ZESTRIL) 5 MG tablet Take 5 mg by mouth daily.    [name] Kitchen NOVOLIN 70/30 RELION (70-30) 100 UNIT/ML injection Inject 40 Units into the skin daily.     . rosuvastatin (CRESTOR) 5 MG tablet Take 5 mg by mouth daily.      ALLERGIES:  No Known Allergies  FAM HX: History reviewed. No pertinent family history.  Social History:    reports that he has never smoked. He has never used smokeless tobacco. He reports previous alcohol use. He reports that he does not use drugs.  ROS: 12 system ROS neg except per HPI above  Blood pressure (!) 214/99, pulse 91, temperature 98 F (36.7 C), temperature source Oral, resp. rate (!) 26, height 5\' 9"  (1.753 m), weight 90.3 kg, SpO2 98 %. PHYSICAL EXAM: Gen: lying comfortably at 45 degrees  Eyes:  anicteric ENT: MMM Neck: supple, JVD to 12cm at 45 deg CV:  RRR, no rub Abd: soft, obese Back: no CVA TTP GU: foley with clear yellow urine Extr: 2+ pitting edema to knees, 1+ to thighs Neuro: nonfocal Skin: warm and dry, no rashes   Results for orders placed or performed during the hospital encounter of 04/24/18 (from the past 48 hour(s))  CBG monitoring, ED     Status: None   Collection Time: 04/24/18  2:18 PM  Result Value Ref Range   Glucose-Capillary 83 70 - 99 mg/dL   Comment 1 Notify RN    Comment 2 Document in Chart   Comprehensive metabolic panel     Status: Abnormal   Collection Time: 04/24/18  2:33 PM  Result Value Ref Range   Sodium 143 135 - 145 mmol/L   Potassium 3.7 3.5 - 5.1 mmol/L  Chloride 113 (H) 98 - 111 mmol/L   CO2 21 (L) 22 - 32 mmol/L   Glucose, Bld 86 70 - 99 mg/dL   BUN 52 (H) 8 - 23 mg/dL   Creatinine, Ser 6.76 (H) 0.61 - 1.24 mg/dL   Calcium 7.7 (L) 8.9 - 10.3 mg/dL   Total Protein 5.5 (L) 6.5 - 8.1 g/dL   Albumin 2.9 (L) 3.5 - 5.0 g/dL   AST 25 15 - 41 U/L   ALT 26 0 - 44 U/L   Alkaline Phosphatase 86 38 - 126 U/L   Total Bilirubin 0.4 0.3 - 1.2 mg/dL   GFR calc non Af Amer 8 (L) >60 mL/min   GFR calc Af Amer 9 (L) >60 mL/min   Anion gap 9 5 - 15    Comment: Performed at [facility], [address] [place], [place] [address]  CBC with Differential     Status: Abnormal   Collection Time: 04/24/18  2:33 PM  Result Value Ref Range   WBC 8.4 4.0 - 10.5 K/uL   RBC 4.15 (L) 4.22 - 5.81 MIL/uL   Hemoglobin 11.7 (L) 13.0 - 17.0 g/dL    HCT 36.8 (L) 39.0 - 52.0 %   MCV 88.7 80.0 - 100.0 fL   MCH 28.2 26.0 - 34.0 pg   MCHC 31.8 30.0 - 36.0 g/dL   RDW 13.6 11.5 - 15.5 %   Platelets 230 150 - 400 K/uL   nRBC 0.0 0.0 - 0.2 %   Neutrophils Relative % 75 %   Neutro Abs 6.4 1.7 - 7.7 K/uL   Lymphocytes Relative 14 %   Lymphs Abs 1.2 0.7 - 4.0 K/uL   Monocytes Relative 8 %   Monocytes Absolute 0.6 0.1 - 1.0 K/uL   Eosinophils Relative 2 %   Eosinophils Absolute 0.2 0.0 - 0.5 K/uL   Basophils Relative 1 %   Basophils Absolute 0.0 0.0 - 0.1 K/uL   Immature Granulocytes 0 %   Abs Immature Granulocytes 0.02 0.00 - 0.07 K/uL    Comment: Performed at [facility] [address] [place], [place] [address]  Brain natriuretic peptide     Status: Abnormal   Collection Time: 04/24/18  2:34 PM  Result Value Ref Range   B Natriuretic Peptide 1,674.1 (H) 0.0 - 100.0 pg/mL    Comment: Performed at [facility] [address], [place], [place] [address]  I-stat troponin, ED     Status: None   Collection Time: 04/24/18  2:55 PM  Result Value Ref Range   Troponin i, poc 0.05 0.00 - 0.08 ng/mL   Comment 3            Comment: Due to the release kinetics of cTnI, a negative result within the first hours of the onset of symptoms does not rule out myocardial infarction with certainty. If myocardial infarction is still suspected, repeat the test at appropriate intervals.   Sodium, urine, random     Status: None   Collection Time: 04/24/18  4:30 PM  Result Value Ref Range   Sodium, Ur 71 mmol/L    Comment: Performed at [facility] [address] [place], [place] [address]  Creatinine, urine, random     Status: None   Collection Time: 04/24/18  4:30 PM  Result Value Ref Range   Creatinine, Urine 116.81 mg/dL    Comment: Performed at [facility] [address], [place], [place] [address]  Urinalysis, Routine w reflex microscopic     Status: Abnormal   Collection Time: 04/24/18  5:18 PM  Result Value  Ref Range   Color, Urine YELLOW YELLOW   APPearance HAZY (A) CLEAR   Specific Gravity, Urine 1.012 1.005 - 1.030   pH 5.0 5.0 - 8.0   Glucose, UA 50 (A) NEGATIVE mg/dL   Hgb urine dipstick SMALL (A) NEGATIVE   Bilirubin Urine NEGATIVE NEGATIVE   Ketones, ur NEGATIVE NEGATIVE mg/dL   Protein, ur >=300 (A) NEGATIVE mg/dL   Nitrite NEGATIVE NEGATIVE   Leukocytes,Ua NEGATIVE NEGATIVE   RBC / HPF 0-5 0 - 5 RBC/hpf   WBC, UA 0-5 0 - 5 WBC/hpf   Bacteria, UA MANY (A) NONE SEEN    Comment: Performed at [facility], [address], [place], [place] [address]    Dg Chest Port 1 View  Result Date: 04/24/2018 CLINICAL DATA:  Chest pain and shortness of breath. Bilateral lower extremity swelling. EXAM: PORTABLE CHEST 1 VIEW COMPARISON:  None. FINDINGS: Mild cardiomegaly. Atherosclerotic calcification of the aortic arch. Pulmonary vascular congestion and diffuse interstitial thickening. Small right greater than left pleural effusions and right greater than left basilar atelectasis. No pneumothorax. No acute osseous abnormality. IMPRESSION: 1. Cardiomegaly with mild interstitial pulmonary edema and small bilateral pleural effusions, consistent with congestive heart failure. 2.  Aortic atherosclerosis (ICD10-I70.0). Electronically Signed   By: Titus Dubin M.D.   On: 04/24/2018 14:57    Assessment/Plan **AKI on CKD:  Baseline Cr by report 3.1 now ~7.  I suspect this is decompensated HF (potentially diastolic) + hypertensive urgency.  No uremic signs or symptoms and I suspect he will improve with BP and volume management.   To be complete, will check UA, UP/C and can compare to priors. There are not systemic symptoms that bring to mind a GN, but nephrotic syndrome could present this way as well.  Further eval based on those preliminary tests.  Renal US ordered to r/o obstruction as well.  Would hold RAAS inhibition including ACEi for now in light of severely reduced renal function.   **Volume  overload:  Diuresis lasix 40 IV BID ordered, strict I/Os, daily weights.  Likely will need diuretic titration for effect, follow.  Would obtain TTE if not already done.    **Hypertensive urgency:  200s/100s currently, relatively asymptomatic currently but has been having chest pain lately.  Has rec'd hydralazine 10 IV now.   Hold ACEi for now.  Will start bidil TID for now.    **HL: on crestor.  Will follow, page with questions, concerns.   Justin Mend 04/24/2018, 6:21 PM

## 2018-04-25 ENCOUNTER — Inpatient Hospital Stay (HOSPITAL_COMMUNITY): Payer: Medicare HMO

## 2018-04-25 DIAGNOSIS — I371 Nonrheumatic pulmonary valve insufficiency: Secondary | ICD-10-CM

## 2018-04-25 DIAGNOSIS — I340 Nonrheumatic mitral (valve) insufficiency: Secondary | ICD-10-CM

## 2018-04-25 DIAGNOSIS — I5031 Acute diastolic (congestive) heart failure: Secondary | ICD-10-CM

## 2018-04-25 LAB — LIPID PANEL
Cholesterol: 109 mg/dL (ref 0–200)
HDL: 34 mg/dL — ABNORMAL LOW (ref >40)
LDL Cholesterol: 60 mg/dL (ref 0–99)
Total CHOL/HDL Ratio: 3.2 RATIO
Triglycerides: 73 mg/dL (ref <150)
VLDL: 15 mg/dL (ref 0–40)

## 2018-04-25 LAB — CBC WITH DIFFERENTIAL/PLATELET
Abs Immature Granulocytes: 0.03 10*3/uL (ref 0.00–0.07)
Basophils Absolute: 0 10*3/uL (ref 0.0–0.1)
Basophils Relative: 0 %
Eosinophils Absolute: 0.1 10*3/uL (ref 0.0–0.5)
Eosinophils Relative: 1 %
HCT: 32.5 % — ABNORMAL LOW (ref 39.0–52.0)
Hemoglobin: 10.5 g/dL — ABNORMAL LOW (ref 13.0–17.0)
Immature Granulocytes: 0 %
Lymphocytes Relative: 12 %
Lymphs Abs: 1.2 10*3/uL (ref 0.7–4.0)
MCH: 28.8 pg (ref 26.0–34.0)
MCHC: 32.3 g/dL (ref 30.0–36.0)
MCV: 89 fL (ref 80.0–100.0)
Monocytes Absolute: 0.9 10*3/uL (ref 0.1–1.0)
Monocytes Relative: 9 %
Neutro Abs: 7.6 10*3/uL (ref 1.7–7.7)
Neutrophils Relative %: 78 %
Platelets: 222 10*3/uL (ref 150–400)
RBC: 3.65 MIL/uL — ABNORMAL LOW (ref 4.22–5.81)
RDW: 13.8 % (ref 11.5–15.5)
WBC: 9.9 10*3/uL (ref 4.0–10.5)
nRBC: 0 % (ref 0.0–0.2)

## 2018-04-25 LAB — BASIC METABOLIC PANEL
Anion gap: 14 (ref 5–15)
BUN: 56 mg/dL — ABNORMAL HIGH (ref 8–23)
CO2: 20 mmol/L — ABNORMAL LOW (ref 22–32)
Calcium: 7.6 mg/dL — ABNORMAL LOW (ref 8.9–10.3)
Chloride: 111 mmol/L (ref 98–111)
Creatinine, Ser: 6.93 mg/dL — ABNORMAL HIGH (ref 0.61–1.24)
GFR calc Af Amer: 9 mL/min — ABNORMAL LOW (ref >60)
GFR calc non Af Amer: 7 mL/min — ABNORMAL LOW (ref >60)
Glucose, Bld: 81 mg/dL (ref 70–99)
Potassium: 3.6 mmol/L (ref 3.5–5.1)
Sodium: 145 mmol/L (ref 135–145)

## 2018-04-25 LAB — GLUCOSE, CAPILLARY
Glucose-Capillary: 77 mg/dL (ref 70–99)
Glucose-Capillary: 78 mg/dL (ref 70–99)
Glucose-Capillary: 106 mg/dL — ABNORMAL HIGH (ref 70–99)
Glucose-Capillary: 98 mg/dL (ref 70–99)

## 2018-04-25 LAB — ECHOCARDIOGRAM LIMITED
Height: 69 in
Weight: 3075.86 oz

## 2018-04-25 LAB — TROPONIN I: Troponin I: 0.07 ng/mL (ref <0.03)

## 2018-04-25 LAB — HIV ANTIBODY (ROUTINE TESTING W REFLEX): HIV Screen 4th Generation wRfx: NONREACTIVE

## 2018-04-25 LAB — HEMOGLOBIN A1C
Hgb A1c MFr Bld: 6.2 % — ABNORMAL HIGH (ref 4.8–5.6)
Mean Plasma Glucose: 131.24 mg/dL

## 2018-04-25 MED ORDER — METOPROLOL SUCCINATE ER 25 MG PO TB24
25.0000 mg | ORAL_TABLET | Freq: Every day | ORAL | Status: DC
  Administered 2018-04-25 – 2018-04-26 (×2): 25 mg via ORAL
  Filled 2018-04-25 (×2): qty 1

## 2018-04-25 MED ORDER — ZOLPIDEM TARTRATE 5 MG PO TABS
5.0000 mg | ORAL_TABLET | Freq: Every evening | ORAL | Status: DC | PRN
  Administered 2018-04-25 – 2018-04-29 (×3): 5 mg via ORAL
  Filled 2018-04-25 (×3): qty 1

## 2018-04-25 MED ORDER — ASPIRIN 81 MG PO CHEW
81.0000 mg | CHEWABLE_TABLET | Freq: Every day | ORAL | Status: DC
  Administered 2018-04-25 – 2018-04-30 (×6): 81 mg via ORAL
  Filled 2018-04-25 (×6): qty 1

## 2018-04-25 MED ORDER — ROSUVASTATIN CALCIUM 20 MG PO TABS
40.0000 mg | ORAL_TABLET | Freq: Every day | ORAL | Status: DC
  Administered 2018-04-25 – 2018-05-02 (×7): 40 mg via ORAL
  Filled 2018-04-25 (×7): qty 2

## 2018-04-25 NOTE — Consult Note (Signed)
Cardiology Consultation:   Patient ID: Justin [name]; [id number]; 03-20-1950   Admit date: 04/24/2018 Date of Consult: 04/25/2018  Primary Care Provider: Reynold Bowen, MD Primary Cardiologist: New to [facility]  Patient Profile:   Justin [name] is a 68 y.o. male with a hx of hypertension, CKD stage IV and DM 2 who is being seen today for the evaluation of acute CHF exacerbation and chest pain at the request of Dr. Tamala Julian with internal medicine.  History of Present Illness:   Justin [name] is a 68 year old male with a history as stated above who presented to [facility] on 04/24/2018 with progressively worsening shortness of breath, lower extremity edema, orthopnea symptoms and exertioanl chest pain for the last week. He works as a Physicist, medical and prior to one week ago he was able to perform his daily activities without problems. He denies associated nausea, vomiting or diaphoresis. He states that the chest pain would begin with his normal exertion and would relieve with rest. He has no prior hx of CAD. Has a family hx in his grandfather of DM and MI but no other family hx that he is aware of. He denies tobacco, alcohol or illicit drug use. He had an appointment with his PCP already scheduled for 04/24/2018 and was trying to wait to discuss his more recent symptoms. He was noted to be hypertensive and edematous at this appointment and was therefore sent to the ED for further evaluation. He is noted to be followed by nephrology in the outpatient setting, last seen greater than one year ago. Home medications include lisinopril. He is not on PO diuretics.  In the ED, patient was noted to be hypertensiv with a BP of 214/99 and was tachypneic.  Placed on 4 L [place] with improvement.  He was given SL NTG, lisinopril 10 mg and IV hydralazine with little improvement in pressures.  CXR revealed interstitial edema and pleural effusions consistent with CHF.  BNP was markedly elevated at 1674.  Creatinine noted to be 6.76 and  was previously 3.1 at baseline.   Past Medical History:  Diagnosis Date   BPH (benign prostatic hyperplasia)    Chronic kidney disease (CKD), stage IV (severe) (HCC)    Diabetes (HCC)    Dysuria    Erectile dysfunction    Hyperlipemia    Hypertension    Pinna disorder, left    Renal disorder     History reviewed. No pertinent surgical history.   Prior to Admission medications   Medication Sig Start Date End Date Taking? Authorizing Provider  lisinopril (ZESTRIL) 5 MG tablet Take 5 mg by mouth daily. 03/08/18  Yes [provider]  NOVOLIN 70/30 RELION (70-30) 100 UNIT/ML injection Inject 40 Units into the skin daily.  03/08/18  Yes [provider]  rosuvastatin (CRESTOR) 5 MG tablet Take 5 mg by mouth daily. 03/08/18  Yes [provider]    Inpatient Medications: Scheduled Meds:  furosemide  40 mg Intravenous Q12H   heparin  5,000 Units Subcutaneous Q8H   insulin aspart  0-5 Units Subcutaneous QHS   insulin aspart  0-9 Units Subcutaneous TID WC   insulin aspart protamine- aspart  20 Units Subcutaneous Q breakfast   isosorbide-hydrALAZINE  1 tablet Oral TID   Living Better with Heart Failure Book   Does not apply Once   pneumococcal 23 valent vaccine  0.5 mL Intramuscular Tomorrow-1000   rosuvastatin  5 mg Oral Daily   sodium chloride flush  3 mL Intravenous  Q12H   Continuous Infusions:  sodium chloride     PRN Meds: sodium chloride, acetaminophen, hydrALAZINE, ondansetron (ZOFRAN) IV, sodium chloride flush  Allergies:   No Known Allergies  Social History:   Social History   Socioeconomic History   Marital status: Divorced    Spouse name: Not on file   Number of children: Not on file   Years of education: Not on file   Highest education level: Not on file  Occupational History   Not on file  Social Needs   Financial resource strain: Not on file   Food insecurity:    Worry: Not on file    Inability: Not on file     Transportation needs:    Medical: Not on file    Non-medical: Not on file  Tobacco Use   Smoking status: Never Smoker   Smokeless tobacco: Never Used  Substance and Sexual Activity   Alcohol use: Not Currently   Drug use: Never   Sexual activity: Not Currently  Lifestyle   Physical activity:    Days per week: Not on file    Minutes per session: Not on file   Stress: Not on file  Relationships   Social connections:    Talks on phone: Not on file    Gets together: Not on file    Attends religious service: Not on file    Active member of club or organization: Not on file    Attends meetings of clubs or organizations: Not on file    Relationship status: Not on file   Intimate partner violence:    Fear of current or ex partner: Not on file    Emotionally abused: Not on file    Physically abused: Not on file    Forced sexual activity: Not on file  Other Topics Concern   Not on file  Social History Narrative   Not on file    Family History:   History reviewed. No pertinent family history. Family Status:  No family status information on file.    ROS:  Please see the history of present illness.  Mild cough that is nonproductive.  No fevers or chills. All other ROS reviewed and negative.     Physical Exam/Data:   Vitals:   04/24/18 1922 04/24/18 2000 04/24/18 2100 04/25/18 0341  BP: (!) 177/75 (!) 166/75 (!) 166/83   Pulse:  96 83   Resp:  (!) 24 19   Temp: 98.2 F (36.8 C)   98 F (36.7 C)  TempSrc: Oral     SpO2:  99% 100%   Weight:    87.2 kg  Height:        Intake/Output Summary (Last 24 hours) at 04/25/2018 0642 Last data filed at 04/25/2018 0600 Gross per 24 hour  Intake 240 ml  Output 3000 ml  Net -2760 ml   Filed Weights   04/24/18 1418 04/24/18 1753 04/25/18 0341  Weight: 85.3 kg 90.3 kg 87.2 kg   Body mass index is 28.39 kg/m.   General:  Well nourished, well developed, in no acute distress HEENT: normal Neck: no JVD Endocrine:   No thryomegaly Vascular: No carotid bruits; 4/4 extremity pulses 2+, without bruits  Cardiac:  normal S1, S2; RRR; 2/6 systolic murmur Lungs:  Diminished BS bases Abd: soft, nontender, no hepatomegaly  Ext: 1+ edema Musculoskeletal:  No deformities, BUE and BLE strength normal and equal Skin: warm and dry  Neuro:  CNs 2-12 intact, no focal abnormalities noted Psych:  Normal affect   EKG:  The EKG was personally reviewed and demonstrates:  04/24/2018 NSR with T wave abnormalities in lateral leads and conduction disease   Telemetry:  Telemetry was personally reviewed and demonstrates: Sinus rhythm with PAT and PVCs.  Relevant CV Studies:  ECHO: None   CATH: None   Laboratory Data:  Chemistry Recent Labs  Lab 04/24/18 1433 May 02, 2018 0429  NA 143 145  K 3.7 3.6  CL 113* 111  CO2 21* 20*  GLUCOSE 86 81  BUN 52* 56*  CREATININE 6.76* 6.93*  CALCIUM 7.7* 7.6*  GFRNONAA 8* 7*  GFRAA 9* 9*  ANIONGAP 9 14    Total Protein  Date Value Ref Range Status  04/24/2018 5.5 (L) 6.5 - 8.1 g/dL Final   Albumin  Date Value Ref Range Status  04/24/2018 2.9 (L) 3.5 - 5.0 g/dL Final   AST  Date Value Ref Range Status  04/24/2018 25 15 - 41 U/L Final   ALT  Date Value Ref Range Status  04/24/2018 26 0 - 44 U/L Final   Alkaline Phosphatase  Date Value Ref Range Status  04/24/2018 86 38 - 126 U/L Final   Total Bilirubin  Date Value Ref Range Status  04/24/2018 0.4 0.3 - 1.2 mg/dL Final   Hematology Recent Labs  Lab 04/24/18 1433 May 02, 2018 0429  WBC 8.4 9.9  RBC 4.15* 3.65*  HGB 11.7* 10.5*  HCT 36.8* 32.5*  MCV 88.7 89.0  MCH 28.2 28.8  MCHC 31.8 32.3  RDW 13.6 13.8  PLT 230 222   Cardiac Enzymes Recent Labs  Lab 04/24/18 1811 04/24/18 2240 05/02/18 0429  TROPONINI 0.05* 0.06* 0.07*    Recent Labs  Lab 04/24/18 1455  TROPIPOC 0.05    BNP Recent Labs  Lab 04/24/18 1434  BNP 1,674.1*    TSH:  Lab Results  Component Value Date   TSH 3.155 04/24/2018    Lipids: Lab Results  Component Value Date   CHOL 109 05/02/2018   HDL 34 (L) 05/02/2018   LDLCALC 60 05/02/2018   TRIG 73 May 02, 2018   CHOLHDL 3.2 May 02, 2018   HgbA1c: Lab Results  Component Value Date   HGBA1C 6.2 (H) May 02, 2018    Radiology/Studies:  US Renal  Result Date: 05-02-2018 CLINICAL DATA:  Acute renal failure EXAM: RENAL / URINARY TRACT ULTRASOUND COMPLETE COMPARISON:  None. FINDINGS: Right Kidney: Renal measurements: 8.9 x 4.8 x 4.9 cm = volume: 111 mL. Increased echotexture and cortical thinning. No mass or hydronephrosis. Left Kidney: Renal measurements: 10.8 x 5.1 x 5.8 cm = volume: 173 mL. Increased echotexture. No mass or hydronephrosis. Bladder: Decompressed with Foley catheter in place. Incidentally noted are bilateral pleural effusions. IMPRESSION: Increased echotexture in the kidneys bilaterally compatible with chronic medical renal disease. No acute findings or hydronephrosis. Electronically Signed   By: Rolm Baptise M.D.   On: 05/02/18 00:39   Dg Chest Port 1 View  Result Date: 04/24/2018 CLINICAL DATA:  Chest pain and shortness of breath. Bilateral lower extremity swelling. EXAM: PORTABLE CHEST 1 VIEW COMPARISON:  None. FINDINGS: Mild cardiomegaly. Atherosclerotic calcification of the aortic arch. Pulmonary vascular congestion and diffuse interstitial thickening. Small right greater than left pleural effusions and right greater than left basilar atelectasis. No pneumothorax. No acute osseous abnormality. IMPRESSION: 1. Cardiomegaly with mild interstitial pulmonary edema and small bilateral pleural effusions, consistent with congestive heart failure. 2.  Aortic atherosclerosis (ICD10-I70.0). Electronically Signed   By: Titus Dubin M.D.   On: 04/24/2018 14:57  Assessment and Plan:   1.  Acute CHF exacerbation: -Patient [facility] to [facility]/24/2018 with worsening shortness of breath, LE swelling, orthopnea symptoms and exertional chest pain for the last week.  Prior to that he was in his usual state of health. BP on presentation noted to be 219/99 -CXR with interstitial edema and pleural effusions consistent with CHF -BNP elevated at 1674 -Continue Lasix 40 mg IV twice daily>>likely will need further titration  -I&O, net negative 2.7L since admission  -Admission weight, 192lb>>>199lb on 04/24/2018 -Echocardiogram ordered to evaluate LV function>>pending   -EKG with NSR with biphasicT wave abnormalities in lateral leads and conduction disease    -Troponin 0.05, 0.06, 0.07>>> likely in the setting of hypertensive urgency and acute volume overload -Pt has no known hx of CAD however with significant risk factors that include HTN, DM and HLD there is moderate concern for ACS -Obtain echocardiogram. If significant LV dysfunction and/or WMA>>will need to consider further ischemic work-up however given significant creatinine elevation, will need to consider appropriate timing. Will treat with diuretics for now with the assistance of nephrology. If LV dysfunction, will need optimum HF regimen    2.  Acute on chronic kidney disease, stage IV: -Creatinine on presentation noted to be 6.93 with no significant baseline on file however nephrology reports baseline of 3.1 -Noted to be followed by outpatient nephrology>>renal US ordered to r/o obstruction  -Will continue with IV Lasix 40 mg twice daily for now and likely will need further titration>>follow renal function closely -Nephrology consulted for assistance   3.  Hypertensive urgency: -BP on presentation 219/99>> downtrending but continues to be elevated>166/83>166/75>177/75>166/80 -Home medications include lisinopril 5 mg>>hold ACEI for now -Bidil TID started per nephrology -Continue PRN hydralazine  4.  DM2: -Hemoglobin A1c,6.2  -SSI for glucose control while inpatient status -Per IM  5.  HLD: -LDL 60 on 04/25/2018 -Continue Crestor 5 mg   For questions or updates, please contact [place] Please consult [email or web address] for contact info under Cardiology/STEMI.   Signed, Kathyrn Drown NP-C [facility] Pager: [phone] 04/25/2018 6:42 AM  As above, patient seen and examined.  Briefly he is a 68 year old male with past medical history of diabetes mellitus for 37 years, hypertension, chronic stage IV kidney disease admitted with acute diastolic congestive heart failure/volume excess and exertional chest tightness for further evaluation.  Patient states that for 1 week he has had progressive dyspnea on exertion.  He notes orthopnea, PND and has had pedal edema for 2 to 3 days.  He also describes some chest heaviness with exertion relieved with rest.  He has been admitted and cardiology asked to evaluate.  Initial blood pressure 193/115.  Electrocardiogram shows sinus rhythm, first-degree AV block, nonspecific ST changes.  Creatinine 6.93, troponins 0.05, 0.06 and 0.07.  Hemoglobin 10.5.  BNP 1674.  Chest x-ray with congestive heart failure.  1 acute diastolic congestive heart failure/volume excess-patient volume overloaded on examination likely from acute diastolic congestive heart failure with contribution from worsening renal function.  Plan to continue Lasix at present dose.  Follow renal function.  Needs fluid restriction and low-sodium diet.  Check echocardiogram.  2 exertional chest discomfort-symptoms likely angina.  Complicated decision concerning further ischemia evaluation given severity of renal insufficiency.  Cardiac catheterization at this point would likely result in need for dialysis due to risk of contrast nephropathy.  Therefore would favor treating congestive heart failure and once improved plan proceeding with stress nuclear study.  If high risk would need to proceed with  cardiac catheterization despite risk of contrast nephropathy.  If low risk could consider treating medically.  Add aspirin 81 mg daily.  Add Toprol 25 mg daily.  Increase Crestor to 40 mg  daily.  3 hypertension-blood pressure significantly elevated at time of admission.  Discontinue lisinopril given renal insufficiency.  Add Toprol 25 mg daily and increase as tolerated.  Agree with BiDil.  4 hyperlipidemia-given probable angina and baseline diabetes mellitus would increase Crestor to 40 mg daily.  Check lipids and liver in 4 weeks.  5 acute on chronic stage IV kidney disease-nephrology following.  Follow renal function closely with diuresis.  6 diabetes mellitus-Per primary care.  Kirk Ruths, MD

## 2018-04-25 NOTE — Progress Notes (Signed)
[facility] Progress Note  Name: Justin [name] MRN: [id number] DOB: 1950-08-09  Chief Complaint:  Shortness of breath   Subjective:  Patient has been given lasix and has had 3 liters UOP over 4/22.  Has a foley in place which is new this hospitalization.  He has had DM for several years.  Follows with [facility] - he's not sure of provider and last saw [place] a year ago.   Review of systems:  Denies shortness of breath or chest pain  Denies nausea or vomiting  Has a foley   ---------------------------------  Background on consult:   Justin [name] is an 68 y.o. male with BPH, CKD 4 (BL Cr reported 3.1), DM, HL who presents with dyspnea and edema.  Nephrology is consulted for evaluation and management of AKI on CKD.  Pt was in his usual state of health until the last few days noted increasing DOE, LE edema, orthopnea.  Went for PCP visit today and was noted to be hypertensive, edematous and sent to ED.  In ED labs show Cr 6.7, proBNP 1674 (no priors).  CXR with edema and small effusions.  Trop 0.05.  He is admitted for IV diuresis.  He notes no recent med changes, no dietary indiscretion of sodium.  No difficulty voiding but past 2 nights had nocturia q1hr.  No dysgeusia, hiccups, pruritis, difficulty with cognition.   Intake/Output Summary (Last 24 hours) at 04/25/2018 0732 Last data filed at 04/25/2018 0731 Gross per 24 hour  Intake 240 ml  Output 3400 ml  Net -3160 ml    Vitals:  Vitals:   04/24/18 2100 04/25/18 0341 04/25/18 0700 04/25/18 0729  BP: (!) 166/83  (!) 149/77 (!) 164/84  Pulse: 83  71 75  Resp: 19  (!) 24 18  Temp:  98 F (36.7 C)    TempSrc:      SpO2: 100%  100% 100%  Weight:  87.2 kg    Height:         Physical Exam:  General adult male in bed in no acute distress on 2L oxygen  HEENT normocephalic atraumatic extraocular movements intact sclera anicteric Neck supple trachea midline Lungs clear to auscultation bilaterally normal work of  breathing at rest and increased with exertion Heart regular rate and rhythm no rub Abdomen soft nontender nondistended Extremities 1+ edema bilaterally  Psych normal mood and affect Neuro alert and oriented x 3; conversant and follows commands   Medications reviewed   Labs:  BMP Latest Ref Rng & Units 04/25/2018 04/24/2018  Glucose 70 - 99 mg/dL 81 86  BUN 8 - 23 mg/dL 56(H) 52(H)  Creatinine 0.61 - 1.24 mg/dL 6.93(H) 6.76(H)  Sodium 135 - 145 mmol/L 145 143  Potassium 3.5 - 5.1 mmol/L 3.6 3.7  Chloride 98 - 111 mmol/L 111 113(H)  CO2 22 - 32 mmol/L 20(L) 21(L)  Calcium 8.9 - 10.3 mg/dL 7.6(L) 7.7(L)     Assessment/Plan:   # AKI on CKD:   - Felt secondary to decompensated HF (potentially diastolic) + hypertensive emergency.  Renal ultrasound without obstruction  - Baseline Cr by report 3.1 now ~7.   - Nonoliguric  - Continue supportive care.  No acute indication for dialysis  - Proteinuria noted - urine protein/cr ratio is ordered   - Would hold RAAS inhibition including ACEi for now in light of severely reduced renal function.   # CKD stage IV - baseline Cr is 3.1 per report   # Volume overload:   -  Responding to current lasix dosing   # Hypertensive emergency:   - Blood pressure improving - Holding lisinopril with AKI     # HL: on crestor.  Claudia Desanctis, MD 04/25/2018 7:32 AM

## 2018-04-25 NOTE — Progress Notes (Signed)
[facility] Progress Note  Patient: Justin [name] IRC:[id number]   PCP: Reynold Bowen, MD DOB: [birth year]   DOA: 04/24/2018   DOS: 04/25/2018   Date of Service: the patient was seen and examined on 04/25/2018  Brief hospital course: Pt. with PMH of hypertension, CKD stage IV and DM 2 ; admitted on 04/24/2018, presented with complaint of shortnes of breath, was found to have acute on chronic systolic CHF. Currently further plan is continue IV diuresis.  Subjective: Continues to have shortness of breath no nausea no vomiting.  No chest pain.  Assessment and Plan: Acute respiratory distress Acute on chronic systolic CHF Presents with complaints of shortness of breath and lower extremity swelling over the last week.  Patient with 2+ pitting lower extremity edema and crackles on lung exam.  BNP elevated at 1674 .1 and chest x-ray showing interstitial edema.  Patient with no prior history of heart disease. -Strict I&Os and daily weights -Elevate lower extremities -Lasix 40 mg IV Bid - echocardiogram shows decreased EF.  May require further work-up. Appreciate cardiology assistance  Hypertensive urgency: On admission patient's blood pressure elevated 210/113.  Patient had been given nitroglycerin and lisinopril in the ED.   -Hold lisinopril due to acute kidney injury -Hydralazine IV as needed -Nephrology recommended starting isosorbide -hydralazine 20-37.5 mg 3 times daily  Acute renal failure superimposed on chronic kidney disease stage IV: Previous noted to have creatinine around 3.1, but presents with creatinine 6.76 with BUN 32.  Suspect acute rise could be related with hypoperfusion related with heart failure. -Appreciate nephrology consultative services -Follow-up protein /creatinine ratio  Insulin-dependent diabetes mellitus: Patient normally takes Novolin 70/30 insulin 40 units every morning. -Hypoglycemic protocols -Reduced 70/30 insulin to 20 units daily while in hospital  -CBGs q. before meals with sensitive sliding scale insulin -Adjust regimen as needed  Hyperlipidemia -Check lipid panel -Continue Crestor  Diet: Cardiac diet DVT Prophylaxis: subcutaneous Heparin  Advance goals of care discussion: Full code  Family Communication: no family was present at bedside, at the time of interview.   Disposition:  Discharge to home.  Consultants: Nephrology and cardiology Procedures: Echocardiogram  Scheduled Meds: . aspirin  81 mg Oral Daily  . furosemide  40 mg Intravenous Q12H  . heparin  5,000 Units Subcutaneous Q8H  . insulin aspart  0-5 Units Subcutaneous QHS  . insulin aspart  0-9 Units Subcutaneous TID WC  . insulin aspart protamine- aspart  20 Units Subcutaneous Q breakfast  . isosorbide-hydrALAZINE  1 tablet Oral TID  . Living Better with Heart Failure Book   Does not apply Once  . metoprolol succinate  25 mg Oral Daily  . rosuvastatin  40 mg Oral Daily  . sodium chloride flush  3 mL Intravenous Q12H   Continuous Infusions: . sodium chloride     PRN Meds: sodium chloride, acetaminophen, hydrALAZINE, ondansetron (ZOFRAN) IV, sodium chloride flush, zolpidem Antibiotics: Anti-infectives (From admission, onward)   None       Objective: Physical Exam: Vitals:   04/25/18 0700 04/25/18 0729 04/25/18 0732 04/25/18 1200  BP: (!) 149/77 (!) 164/84 (!) 177/93   Pulse: 71 75    Resp: (!) 24 18    Temp:   98.1 F (36.7 C) 98.6 F (37 C)  TempSrc:  Oral Oral Oral  SpO2: 100% 100%    Weight:      Height:        Intake/Output Summary (Last 24 hours) at 04/25/2018 1757 Last data filed at 04/25/2018 1210 Gross  per 24 hour  Intake 600 ml  Output 4025 ml  Net -3425 ml   Filed Weights   04/24/18 1418 04/24/18 1753 04/25/18 0341  Weight: 85.3 kg 90.3 kg 87.2 kg   General: Alert, Awake and Oriented to Time, Place and Person. Appear in mild distress, affect appropriate Eyes: PERRL, Conjunctiva normal ENT: Oral Mucosa clear moist.  Neck: no JVD, no Abnormal Mass Or lumps Cardiovascular: S1 and S2 Present, no Murmur, Peripheral Pulses Present Respiratory: normal respiratory effort, Bilateral Air entry equal and Decreased, no use of accessory muscle, Clear to Auscultation, no Crackles, no wheezes Abdomen: Bowel Sound present, Soft and no tenderness, no hernia Skin: no redness, no Rash, no induration Extremities: bilateral  Pedal edema, no calf tenderness Neurologic: Grossly no focal neuro deficit. Bilaterally Equal motor strength  Data Reviewed: CBC: Recent Labs  Lab 04/24/18 1433 04/25/18 0429  WBC 8.4 9.9  NEUTROABS 6.4 7.6  HGB 11.7* 10.5*  HCT 36.8* 32.5*  MCV 88.7 89.0  PLT 230 956   Basic Metabolic Panel: Recent Labs  Lab 04/24/18 1433 04/25/18 0429  NA 143 145  K 3.7 3.6  CL 113* 111  CO2 21* 20*  GLUCOSE 86 81  BUN 52* 56*  CREATININE 6.76* 6.93*  CALCIUM 7.7* 7.6*    Liver Function Tests: Recent Labs  Lab 04/24/18 1433  AST 25  ALT 26  ALKPHOS 86  BILITOT 0.4  PROT 5.5*  ALBUMIN 2.9*   No results for input(s): LIPASE, AMYLASE in the last 168 hours. No results for input(s): AMMONIA in the last 168 hours. Coagulation Profile: No results for input(s): INR, PROTIME in the last 168 hours. Cardiac Enzymes: Recent Labs  Lab 04/24/18 1811 04/24/18 2240 04/25/18 0429  TROPONINI 0.05* 0.06* 0.07*   BNP (last 3 results) No results for input(s): PROBNP in the last 8760 hours. CBG: Recent Labs  Lab 04/24/18 1418 04/24/18 2246 04/25/18 0608 04/25/18 1141 04/25/18 1705  GLUCAP 83 143* 77 78 106*   Studies: US Renal  Result Date: 04/25/2018 CLINICAL DATA:  Acute renal failure EXAM: RENAL / URINARY TRACT ULTRASOUND COMPLETE COMPARISON:  None. FINDINGS: Right Kidney: Renal measurements: 8.9 x 4.8 x 4.9 cm = volume: 111 mL. Increased echotexture and cortical thinning. No mass or hydronephrosis. Left Kidney: Renal measurements: 10.8 x 5.1 x 5.8 cm = volume: 173 mL. Increased  echotexture. No mass or hydronephrosis. Bladder: Decompressed with Foley catheter in place. Incidentally noted are bilateral pleural effusions. IMPRESSION: Increased echotexture in the kidneys bilaterally compatible with chronic medical renal disease. No acute findings or hydronephrosis. Electronically Signed   By: Rolm Baptise M.D.   On: 04/25/2018 00:39     Time spent: 35 minutes  Author: Berle Mull, MD [facility] 04/25/2018 5:57 PM  To reach On-call, see care teams to locate the attending and reach out to them via [email or web address]. If 7PM-7AM, please contact night-coverage If you still have difficulty reaching the attending provider, please page the [facility] (Director on Call) for [facility] on amion for assistance.

## 2018-04-25 NOTE — Progress Notes (Signed)
  Echocardiogram 2D Echocardiogram has been performed.  Justin [name] 04/25/2018, 8:55 AM

## 2018-04-26 DIAGNOSIS — I5041 Acute combined systolic (congestive) and diastolic (congestive) heart failure: Secondary | ICD-10-CM

## 2018-04-26 LAB — GLUCOSE, CAPILLARY
Glucose-Capillary: 107 mg/dL — ABNORMAL HIGH (ref 70–99)
Glucose-Capillary: 148 mg/dL — ABNORMAL HIGH (ref 70–99)
Glucose-Capillary: 111 mg/dL — ABNORMAL HIGH (ref 70–99)
Glucose-Capillary: 130 mg/dL — ABNORMAL HIGH (ref 70–99)

## 2018-04-26 LAB — BASIC METABOLIC PANEL
Anion gap: 14 (ref 5–15)
BUN: 60 mg/dL — ABNORMAL HIGH (ref 8–23)
CO2: 21 mmol/L — ABNORMAL LOW (ref 22–32)
Calcium: 8 mg/dL — ABNORMAL LOW (ref 8.9–10.3)
Chloride: 106 mmol/L (ref 98–111)
Creatinine, Ser: 7.04 mg/dL — ABNORMAL HIGH (ref 0.61–1.24)
GFR calc Af Amer: 8 mL/min — ABNORMAL LOW (ref >60)
GFR calc non Af Amer: 7 mL/min — ABNORMAL LOW (ref >60)
Glucose, Bld: 110 mg/dL — ABNORMAL HIGH (ref 70–99)
Potassium: 3.6 mmol/L (ref 3.5–5.1)
Sodium: 141 mmol/L (ref 135–145)

## 2018-04-26 LAB — PROTEIN / CREATININE RATIO, URINE
Creatinine, Urine: 71.75 mg/dL
Protein Creatinine Ratio: 7.34 mg/mg{Cre} — ABNORMAL HIGH (ref 0.00–0.15)
Total Protein, Urine: 527 mg/dL

## 2018-04-26 LAB — MAGNESIUM: Magnesium: 1.9 mg/dL (ref 1.7–2.4)

## 2018-04-26 MED ORDER — METOPROLOL SUCCINATE ER 50 MG PO TB24
50.0000 mg | ORAL_TABLET | Freq: Every day | ORAL | Status: DC
  Administered 2018-04-27 – 2018-05-02 (×6): 50 mg via ORAL
  Filled 2018-04-26 (×6): qty 1

## 2018-04-26 NOTE — Progress Notes (Signed)
Error Brian Crenshaw   

## 2018-04-26 NOTE — Progress Notes (Signed)
24 hour urine collection for UPEP/UIFE/Light chain started at 1300hr

## 2018-04-26 NOTE — Progress Notes (Signed)
[facility] Progress Note  Name: Justin [name] MRN: [id number] DOB: 1950-03-09  Chief Complaint:  Shortness of breath   Subjective:  He had 3.7 liters UOP over 4/23.  He has continued with foley. He confirms that his last prior to admission labs were with Dr. Forde Dandy (PCP) about 6 months ago.  States that he last saw [facility] 2 years ago and it was his understanding that he was released.  He has had DM for several years.  He confirms that he would want HD if it were indicated.    Review of systems:   Reports shortness of breath improving denies chest pain  Denies nausea or vomiting  Has a foley   ---------------------------------  Background on consult:   Justin [name] is an 68 y.o. male with BPH, CKD 4 (BL Cr reported 3.1), DM, HL who presents with dyspnea and edema.  Nephrology is consulted for evaluation and management of AKI on CKD.  Pt was in his usual state of health until the last few days noted increasing DOE, LE edema, orthopnea.  Went for PCP visit today and was noted to be hypertensive, edematous and sent to ED.  In ED labs show Cr 6.7, proBNP 1674 (no priors).  CXR with edema and small effusions.  Trop 0.05.  He is admitted for IV diuresis.  He notes no recent med changes, no dietary indiscretion of sodium.  No difficulty voiding but past 2 nights had nocturia q1hr.  No dysgeusia, hiccups, pruritis, difficulty with cognition.   Intake/Output Summary (Last 24 hours) at 04/26/2018 0631 Last data filed at 04/26/2018 0300 Gross per 24 hour  Intake 843 ml  Output 3675 ml  Net -2832 ml    Vitals:  Vitals:   04/25/18 1200 04/25/18 1943 04/25/18 2332 04/26/18 0357  BP:  134/77 138/60 (!) 157/74  Pulse:  70 71   Resp:  (!) 26 (!) 23 20  Temp: 98.6 F (37 C) 98.1 F (36.7 C) 98.3 F (36.8 C) 98 F (36.7 C)  TempSrc: Oral Oral Oral Oral  SpO2:  98% 98% 95%  Weight:    85.8 kg  Height:         Physical Exam:  General adult male in bed in no acute  distress HEENT normocephalic atraumatic extraocular movements intact sclera anicteric Neck supple trachea midline Lungs clear to auscultation bilaterally normal work of breathing at rest; room air Heart regular rate and rhythm no rub Abdomen soft nontender nondistended Extremities 1-2+ edema bilaterally  Psych normal mood and affect Neuro alert and oriented x 3; conversant and follows commands   Medications reviewed   Labs:  BMP Latest Ref Rng & Units 04/26/2018 04/25/2018 04/24/2018  Glucose 70 - 99 mg/dL 110(H) 81 86  BUN 8 - 23 mg/dL 60(H) 56(H) 52(H)  Creatinine 0.61 - 1.24 mg/dL 7.04(H) 6.93(H) 6.76(H)  Sodium 135 - 145 mmol/L 141 145 143  Potassium 3.5 - 5.1 mmol/L 3.6 3.6 3.7  Chloride 98 - 111 mmol/L 106 111 113(H)  CO2 22 - 32 mmol/L 21(L) 20(L) 21(L)  Calcium 8.9 - 10.3 mg/dL 8.0(L) 7.6(L) 7.7(L)     Assessment/Plan:   # AKI on CKD:   - Felt secondary to decompensated HF (potentially diastolic) + hypertensive emergency.  Renal ultrasound without obstruction.  Proteinuria without hematuria.   - Baseline Cr 3.1 and now ~7.   - Nonoliguric  - Continue supportive care.  No acute indication for dialysis.  He confirms that he would want dialysis  if it were indicated  - Proteinuria noted - urine protein/cr ratio is ordered   - Would hold RAAS inhibition including ACEi for now in light of severely reduced renal function.   # Proteinuria  - Likely secondary to diabetes.  Will also check SPEP, UPEP, free light chains - Note up/cr ratio with 7340 mg/g.  No hematuria  # CKD stage IV - baseline Cr is 3.1 from 09/2017 labs with PCP - Will obtain records from PCP  # Acute systolic CHF /Volume overload:   - Responding to current lasix dosing   # Hypertensive emergency:   - Blood pressure improving - Holding lisinopril with AKI     # HL: on crestor.  Claudia Desanctis, MD 04/26/2018 6:31 AM

## 2018-04-26 NOTE — Progress Notes (Signed)
[facility] Progress Note  Patient: Justin [name] AYT:[id number]   PCP: Reynold Bowen, MD DOB: 1950-01-27   DOA: 04/24/2018   DOS: 04/26/2018   Date of Service: the patient was seen and examined on 04/26/2018  Brief hospital course: Pt. with PMH of hypertension, CKD stage IV and DM 2 ; admitted on 04/24/2018, presented with complaint of shortnes of breath, was found to have acute on chronic systolic CHF. Currently further plan is continue IV diuresis.  Subjective: Able to sleep last night.  No nausea no vomiting no fever no chills.  Has a hard time breathing this morning.  Feels stressed in the hospital  Assessment and Plan: Acute respiratory distress Acute on chronic systolic CHF Presents with complaints of shortness of breath and lower extremity swelling over the last week.  Patient with 2+ pitting lower extremity edema and crackles on lung exam.  BNP elevated at 1674 .1 and chest x-ray showing interstitial edema.  Patient with no prior history of heart disease. -Strict I&Os and daily weights -Elevate lower extremities -Lasix 40 mg IV Bid - echocardiogram shows decreased EF.  Will require further work-up. Appreciate cardiology assistance  Hypertensive urgency: On admission patient's blood pressure elevated 210/113.  Patient had been given nitroglycerin and lisinopril in the ED.   -Hold lisinopril due to acute kidney injury -Hydralazine IV as needed -Nephrology recommended starting isosorbide -hydralazine 20-37.5 mg 3 times daily  Acute renal failure superimposed on chronic kidney disease stage IV: Previous noted to have creatinine around 3.1, but presents with creatinine 6.76 with BUN 32.  Suspect acute rise could be related with hypoperfusion related with heart failure. -Appreciate nephrology consultative services -Follow-up protein /creatinine ratio Likely will require hemodialysis if undergoes cardiac catheterization.  Insulin-dependent diabetes mellitus: Patient normally  takes Novolin 70/30 insulin 40 units every morning. -Hypoglycemic protocols -Reduced 70/30 insulin to 20 units daily while in hospital -CBGs q. before meals with sensitive sliding scale insulin -Adjust regimen as needed  Hyperlipidemia -Check lipid panel -Continue Crestor  Diet: Cardiac diet DVT Prophylaxis: subcutaneous Heparin  Advance goals of care discussion: Full code  Family Communication: no family was present at bedside, at the time of interview.   Disposition:  Discharge to home.  Consultants: Nephrology and cardiology Procedures: Echocardiogram  Scheduled Meds: . aspirin  81 mg Oral Daily  . furosemide  40 mg Intravenous Q12H  . heparin  5,000 Units Subcutaneous Q8H  . insulin aspart  0-5 Units Subcutaneous QHS  . insulin aspart  0-9 Units Subcutaneous TID WC  . insulin aspart protamine- aspart  20 Units Subcutaneous Q breakfast  . isosorbide-hydrALAZINE  1 tablet Oral TID  . Living Better with Heart Failure Book   Does not apply Once  . [START ON 04/27/2018] metoprolol succinate  50 mg Oral Daily  . rosuvastatin  40 mg Oral Daily  . sodium chloride flush  3 mL Intravenous Q12H   Continuous Infusions: . sodium chloride     PRN Meds: sodium chloride, acetaminophen, hydrALAZINE, ondansetron (ZOFRAN) IV, sodium chloride flush, zolpidem Antibiotics: Anti-infectives (From admission, onward)   None       Objective: Physical Exam: Vitals:   04/26/18 0724 04/26/18 1140 04/26/18 1456 04/26/18 1649  BP: (!) 155/98 135/69 (!) 153/77 (!) 158/82  Pulse: 76 69 67 68  Resp: 18 20 (!) 22 (!) 24  Temp: 98 F (36.7 C) 97.9 F (36.6 C) 98.2 F (36.8 C)   TempSrc: Oral Oral Oral   SpO2: 98% 99% 98% 100%  Weight:      Height:        Intake/Output Summary (Last 24 hours) at 04/26/2018 1906 Last data filed at 04/26/2018 1700 Gross per 24 hour  Intake 1203 ml  Output 3525 ml  Net -2322 ml   Filed Weights   04/24/18 1753 04/25/18 0341 04/26/18 0357  Weight: 90.3  kg 87.2 kg 85.8 kg   General: Alert, Awake and Oriented to Time, Place and Person. Appear in mild distress, affect appropriate Eyes: PERRL, Conjunctiva normal ENT: Oral Mucosa clear moist. Neck: no JVD, no Abnormal Mass Or lumps Cardiovascular: S1 and S2 Present, no Murmur, Peripheral Pulses Present Respiratory: normal respiratory effort, Bilateral Air entry equal and Decreased, no use of accessory muscle, Clear to Auscultation, no Crackles, no wheezes Abdomen: Bowel Sound present, Soft and no tenderness, no hernia Skin: no redness, no Rash, no induration Extremities: bilateral  Pedal edema, no calf tenderness Neurologic: Grossly no focal neuro deficit. Bilaterally Equal motor strength  Data Reviewed: CBC: Recent Labs  Lab 04/24/18 1433 04/25/18 0429  WBC 8.4 9.9  NEUTROABS 6.4 7.6  HGB 11.7* 10.5*  HCT 36.8* 32.5*  MCV 88.7 89.0  PLT 230 563   Basic Metabolic Panel: Recent Labs  Lab 04/24/18 1433 04/25/18 0429 04/26/18 0317  NA 143 145 141  K 3.7 3.6 3.6  CL 113* 111 106  CO2 21* 20* 21*  GLUCOSE 86 81 110*  BUN 52* 56* 60*  CREATININE 6.76* 6.93* 7.04*  CALCIUM 7.7* 7.6* 8.0*  MG  --   --  1.9    Liver Function Tests: Recent Labs  Lab 04/24/18 1433  AST 25  ALT 26  ALKPHOS 86  BILITOT 0.4  PROT 5.5*  ALBUMIN 2.9*   No results for input(s): LIPASE, AMYLASE in the last 168 hours. No results for input(s): AMMONIA in the last 168 hours. Coagulation Profile: No results for input(s): INR, PROTIME in the last 168 hours. Cardiac Enzymes: Recent Labs  Lab 04/24/18 1811 04/24/18 2240 04/25/18 0429  TROPONINI 0.05* 0.06* 0.07*   BNP (last 3 results) No results for input(s): PROBNP in the last 8760 hours. CBG: Recent Labs  Lab 04/25/18 1705 04/25/18 2111 04/26/18 0627 04/26/18 1139 04/26/18 1614  GLUCAP 106* 98 107* 148* 111*   Studies: No results found.   Time spent: 35 minutes  Author: Berle Mull, MD [facility] 04/26/2018 7:06 PM   To reach On-call, see care teams to locate the attending and reach out to them via [email or web address]. If 7PM-7AM, please contact night-coverage If you still have difficulty reaching the attending provider, please page the [facility] (Director on Call) for [facility] on amion for assistance.

## 2018-04-26 NOTE — Progress Notes (Signed)
Progress Note  Patient Name: Justin [name] Date of Encounter: 04/26/2018  Primary Cardiologist: Dr Stanford Breed  Subjective   No CP; dyspnea continue but improving  Inpatient Medications    Scheduled Meds: . aspirin  81 mg Oral Daily  . furosemide  40 mg Intravenous Q12H  . heparin  5,000 Units Subcutaneous Q8H  . insulin aspart  0-5 Units Subcutaneous QHS  . insulin aspart  0-9 Units Subcutaneous TID WC  . insulin aspart protamine- aspart  20 Units Subcutaneous Q breakfast  . isosorbide-hydrALAZINE  1 tablet Oral TID  . Living Better with Heart Failure Book   Does not apply Once  . [START ON 04/27/2018] metoprolol succinate  50 mg Oral Daily  . rosuvastatin  40 mg Oral Daily  . sodium chloride flush  3 mL Intravenous Q12H   Continuous Infusions: . sodium chloride     PRN Meds: sodium chloride, acetaminophen, hydrALAZINE, ondansetron (ZOFRAN) IV, sodium chloride flush, zolpidem   Vital Signs    Vitals:   04/25/18 1943 04/25/18 2332 04/26/18 0357 04/26/18 0724  BP: 134/77 138/60 (!) 157/74 (!) 155/98  Pulse: 70 71  76  Resp: (!) 26 (!) 23 20 18   Temp: 98.1 F (36.7 C) 98.3 F (36.8 C) 98 F (36.7 C) 98 F (36.7 C)  TempSrc: Oral Oral Oral Oral  SpO2: 98% 98% 95% 98%  Weight:   85.8 kg   Height:        Intake/Output Summary (Last 24 hours) at 04/26/2018 0941 Last data filed at 04/26/2018 0724 Gross per 24 hour  Intake 1083 ml  Output 4300 ml  Net -3217 ml   Last 3 Weights 04/26/2018 04/25/2018 04/24/2018  Weight (lbs) 189 lb 2.5 oz 192 lb 3.9 oz 199 lb 1.2 oz  Weight (kg) 85.8 kg 87.2 kg 90.3 kg      Telemetry    Sinus- Personally Reviewed  Physical Exam   GEN: No acute distress.   Neck: No JVD Cardiac: RRR, 2/6 systolic murmur Respiratory: Diminished BS bases GI: Soft, nontender, non-distended  MS: trace to 1+ edema Neuro:  Nonfocal  Psych: Normal affect   Labs    Chemistry Recent Labs  Lab 04/24/18 1433 04/25/18 0429 04/26/18 0317  NA 143  145 141  K 3.7 3.6 3.6  CL 113* 111 106  CO2 21* 20* 21*  GLUCOSE 86 81 110*  BUN 52* 56* 60*  CREATININE 6.76* 6.93* 7.04*  CALCIUM 7.7* 7.6* 8.0*  PROT 5.5*  --   --   ALBUMIN 2.9*  --   --   AST 25  --   --   ALT 26  --   --   ALKPHOS 86  --   --   BILITOT 0.4  --   --   GFRNONAA 8* 7* 7*  GFRAA 9* 9* 8*  ANIONGAP 9 14 14      Hematology Recent Labs  Lab 04/24/18 1433 04/25/18 0429  WBC 8.4 9.9  RBC 4.15* 3.65*  HGB 11.7* 10.5*  HCT 36.8* 32.5*  MCV 88.7 89.0  MCH 28.2 28.8  MCHC 31.8 32.3  RDW 13.6 13.8  PLT 230 222    Cardiac Enzymes Recent Labs  Lab 04/24/18 1811 04/24/18 2240 04/25/18 0429  TROPONINI 0.05* 0.06* 0.07*    Recent Labs  Lab 04/24/18 1455  TROPIPOC 0.05     BNP Recent Labs  Lab 04/24/18 1434  BNP 1,674.1*      Radiology    US Renal  Result Date: 04/25/2018 CLINICAL DATA:  Acute renal failure EXAM: RENAL / URINARY TRACT ULTRASOUND COMPLETE COMPARISON:  None. FINDINGS: Right Kidney: Renal measurements: 8.9 x 4.8 x 4.9 cm = volume: 111 mL. Increased echotexture and cortical thinning. No mass or hydronephrosis. Left Kidney: Renal measurements: 10.8 x 5.1 x 5.8 cm = volume: 173 mL. Increased echotexture. No mass or hydronephrosis. Bladder: Decompressed with Foley catheter in place. Incidentally noted are bilateral pleural effusions. IMPRESSION: Increased echotexture in the kidneys bilaterally compatible with chronic medical renal disease. No acute findings or hydronephrosis. Electronically Signed   By: Rolm Baptise M.D.   On: 04/25/2018 00:39   Dg Chest Port 1 View  Result Date: 04/24/2018 CLINICAL DATA:  Chest pain and shortness of breath. Bilateral lower extremity swelling. EXAM: PORTABLE CHEST 1 VIEW COMPARISON:  None. FINDINGS: Mild cardiomegaly. Atherosclerotic calcification of the aortic arch. Pulmonary vascular congestion and diffuse interstitial thickening. Small right greater than left pleural effusions and right greater than  left basilar atelectasis. No pneumothorax. No acute osseous abnormality. IMPRESSION: 1. Cardiomegaly with mild interstitial pulmonary edema and small bilateral pleural effusions, consistent with congestive heart failure. 2.  Aortic atherosclerosis (ICD10-I70.0). Electronically Signed   By: Titus Dubin M.D.   On: 04/24/2018 14:57    Patient Profile     68 year old male with past medical history of diabetes mellitus for 37 years, hypertension, chronic stage IV kidney disease admitted with acute diastolic congestive heart failure/volume excess and exertional chest tightness for further evaluation.  Echocardiogram shows ejection fraction 35 to 40% with diffuse hypokinesis by my review.  There is mild right ventricular enlargement, moderate left atrial enlargement, moderate aortic stenosis.  Assessment & Plan    1 acute combined systolic/diastolic congestive heart failure/volume excess-I/O -8333.  Patient remains volume overloaded on examination.  Continue present dose of Lasix.  Follow renal function closely.  2 cardiomyopathy-etiology unclear.  Hypertension could be contributing.  However he has 36 years of diabetes mellitus and also has some exertional chest discomfort.  Difficult situation.  Cardiac catheterization will likely mean permanent dialysis given creatinine of 7.  However given presenting symptoms, reduced LV function and aortic stenosis I think right and left cardiac catheterization will be best option.  It also appears that dialysis is imminent in the near future regardless.  I discussed the risks and benefits of cardiac catheterization including myocardial infarction, CVA and death as well as contrast nephropathy including permanent dialysis and he would be agreeable.  We will review with nephrology.  Continue BiDil and Toprol.  No ACE inhibitor or ARB given renal insufficiency.  2 exertional chest discomfort-symptoms likely angina.  Continue aspirin, Toprol and statin.  As outlined  above we will likely proceed with cardiac catheterization.  3 aortic stenosis-moderate on echocardiogram.  4 hypertension-blood pressure remains elevated.  Advance BiDil and Toprol as needed for control.  5 hyperlipidemia-continue statin.  6 acute on chronic stage IV kidney disease-Dialysis appears to be imminent.  Will review with nephrology prior to scheduling cardiac catheterization (likely next week).   For questions or updates, please contact [facility] Please consult [email or web address] for contact info under        Signed, Kirk Ruths, MD  04/26/2018, 9:41 AM

## 2018-04-27 DIAGNOSIS — I5021 Acute systolic (congestive) heart failure: Secondary | ICD-10-CM

## 2018-04-27 LAB — IRON AND TIBC
Iron: 44 ug/dL — ABNORMAL LOW (ref 45–182)
Saturation Ratios: 17 % — ABNORMAL LOW (ref 17.9–39.5)
TIBC: 253 ug/dL (ref 250–450)
UIBC: 209 ug/dL

## 2018-04-27 LAB — GLUCOSE, CAPILLARY
Glucose-Capillary: 134 mg/dL — ABNORMAL HIGH (ref 70–99)
Glucose-Capillary: 90 mg/dL (ref 70–99)
Glucose-Capillary: 181 mg/dL — ABNORMAL HIGH (ref 70–99)
Glucose-Capillary: 173 mg/dL — ABNORMAL HIGH (ref 70–99)

## 2018-04-27 LAB — CBC
HCT: 32.1 % — ABNORMAL LOW (ref 39.0–52.0)
Hemoglobin: 10.3 g/dL — ABNORMAL LOW (ref 13.0–17.0)
MCH: 28.4 pg (ref 26.0–34.0)
MCHC: 32.1 g/dL (ref 30.0–36.0)
MCV: 88.4 fL (ref 80.0–100.0)
Platelets: 217 10*3/uL (ref 150–400)
RBC: 3.63 MIL/uL — ABNORMAL LOW (ref 4.22–5.81)
RDW: 13.7 % (ref 11.5–15.5)
WBC: 10.1 10*3/uL (ref 4.0–10.5)
nRBC: 0 % (ref 0.0–0.2)

## 2018-04-27 LAB — BASIC METABOLIC PANEL
Anion gap: 12 (ref 5–15)
BUN: 70 mg/dL — ABNORMAL HIGH (ref 8–23)
CO2: 24 mmol/L (ref 22–32)
Calcium: 7.6 mg/dL — ABNORMAL LOW (ref 8.9–10.3)
Chloride: 103 mmol/L (ref 98–111)
Creatinine, Ser: 7.12 mg/dL — ABNORMAL HIGH (ref 0.61–1.24)
GFR calc Af Amer: 8 mL/min — ABNORMAL LOW (ref >60)
GFR calc non Af Amer: 7 mL/min — ABNORMAL LOW (ref >60)
Glucose, Bld: 103 mg/dL — ABNORMAL HIGH (ref 70–99)
Potassium: 3.6 mmol/L (ref 3.5–5.1)
Sodium: 139 mmol/L (ref 135–145)

## 2018-04-27 LAB — FERRITIN: Ferritin: 139 ng/mL (ref 24–336)

## 2018-04-27 LAB — MAGNESIUM: Magnesium: 2 mg/dL (ref 1.7–2.4)

## 2018-04-27 NOTE — Progress Notes (Signed)
[facility] Progress Note  Patient: Justin [name] GYJ:[id number]   PCP: Reynold Bowen, MD DOB: [birth year]   DOA: 04/24/2018   DOS: 04/27/2018   Date of Service: the patient was seen and examined on 04/27/2018  Brief hospital course: Pt. with PMH of hypertension, CKD stage IV and DM 2 ; admitted on 04/24/2018, presented with complaint of shortnes of breath, was found to have acute on chronic systolic CHF. Currently further plan is continue IV diuresis.  Subjective: Slept okay last night.  No orthopnea.  No nausea no vomiting.  No chest pain no abdominal pain.  Assessment and Plan: Acute respiratory distress Acute on chronic systolic CHF Presents with complaints of shortness of breath and lower extremity swelling over the last week.  Patient with 2+ pitting lower extremity edema and crackles on lung exam.  BNP elevated at 1674 .1 and chest x-ray showing interstitial edema.  Patient with no prior history of heart disease. -Strict I&Os and daily weights -Elevate lower extremities -Lasix 40 mg IV Bid - echocardiogram shows decreased EF.  Will require further work-up. Appreciate cardiology assistance  Hypertensive urgency: On admission patient's blood pressure elevated 210/113.  Patient had been given nitroglycerin and lisinopril in the ED.   -Hold lisinopril due to acute kidney injury -Hydralazine IV as needed -Nephrology recommended starting isosorbide -hydralazine 20-37.5 mg 3 times daily  Acute renal failure superimposed on chronic kidney disease stage IV: Previous noted to have creatinine around 3.1, but presents with creatinine 6.76 with BUN 32.  Suspect acute rise could be related with hypoperfusion related with heart failure. -Appreciate nephrology consultative services -Follow-up protein /creatinine ratio Likely will require hemodialysis if undergoes cardiac catheterization.  Insulin-dependent diabetes mellitus: Patient normally takes Novolin 70/30 insulin 40 units every  morning. -Hypoglycemic protocols -Reduced 70/30 insulin to 20 units daily while in hospital -CBGs q. before meals with sensitive sliding scale insulin -Adjust regimen as needed  Hyperlipidemia -Check lipid panel -Continue Crestor  Diet: Cardiac diet DVT Prophylaxis: subcutaneous Heparin  Advance goals of care discussion: Full code  Family Communication: no family was present at bedside, at the time of interview. D/W significant other 04/27/2018  Disposition:  Discharge to home.  Consultants: Nephrology and cardiology Procedures: Echocardiogram  Scheduled Meds: . aspirin  81 mg Oral Daily  . furosemide  40 mg Intravenous Q12H  . heparin  5,000 Units Subcutaneous Q8H  . insulin aspart  0-5 Units Subcutaneous QHS  . insulin aspart  0-9 Units Subcutaneous TID WC  . insulin aspart protamine- aspart  20 Units Subcutaneous Q breakfast  . isosorbide-hydrALAZINE  1 tablet Oral TID  . Living Better with Heart Failure Book   Does not apply Once  . metoprolol succinate  50 mg Oral Daily  . rosuvastatin  40 mg Oral Daily  . sodium chloride flush  3 mL Intravenous Q12H   Continuous Infusions: . sodium chloride     PRN Meds: sodium chloride, acetaminophen, hydrALAZINE, ondansetron (ZOFRAN) IV, sodium chloride flush, zolpidem Antibiotics: Anti-infectives (From admission, onward)   None       Objective: Physical Exam: Vitals:   04/27/18 0553 04/27/18 0804 04/27/18 1113 04/27/18 1450  BP:  (!) 132/56 (!) 143/76 127/67  Pulse:  63 (!) 58 67  Resp:  17 12 17   Temp:  98 F (36.7 C) 98 F (36.7 C) 98.3 F (36.8 C)  TempSrc:  Oral Oral Oral  SpO2:  97% 95% 96%  Weight: 83.7 kg     Height:  Intake/Output Summary (Last 24 hours) at 04/27/2018 1700 Last data filed at 04/27/2018 1500 Gross per 24 hour  Intake 720 ml  Output 3300 ml  Net -2580 ml   Filed Weights   04/25/18 0341 04/26/18 0357 04/27/18 0553  Weight: 87.2 kg 85.8 kg 83.7 kg   General: Alert, Awake and  Oriented to Time, Place and Person. Appear in mild distress, affect appropriate Eyes: PERRL, Conjunctiva normal ENT: Oral Mucosa clear moist. Neck: no JVD, no Abnormal Mass Or lumps Cardiovascular: S1 and S2 Present, no Murmur, Peripheral Pulses Present Respiratory: normal respiratory effort, Bilateral Air entry equal and Decreased, no use of accessory muscle, Clear to Auscultation, no Crackles, no wheezes Abdomen: Bowel Sound present, Soft and no tenderness, no hernia Skin: no redness, no Rash, no induration Extremities: bilateral  Pedal edema, no calf tenderness Neurologic: Grossly no focal neuro deficit. Bilaterally Equal motor strength  Data Reviewed: CBC: Recent Labs  Lab 04/24/18 1433 04/25/18 0429 04/27/18 0215  WBC 8.4 9.9 10.1  NEUTROABS 6.4 7.6  --   HGB 11.7* 10.5* 10.3*  HCT 36.8* 32.5* 32.1*  MCV 88.7 89.0 88.4  PLT 230 222 237   Basic Metabolic Panel: Recent Labs  Lab 04/24/18 1433 04/25/18 0429 04/26/18 0317 04/27/18 0215  NA 143 145 141 139  K 3.7 3.6 3.6 3.6  CL 113* 111 106 103  CO2 21* 20* 21* 24  GLUCOSE 86 81 110* 103*  BUN 52* 56* 60* 70*  CREATININE 6.76* 6.93* 7.04* 7.12*  CALCIUM 7.7* 7.6* 8.0* 7.6*  MG  --   --  1.9 2.0    Liver Function Tests: Recent Labs  Lab 04/24/18 1433  AST 25  ALT 26  ALKPHOS 86  BILITOT 0.4  PROT 5.5*  ALBUMIN 2.9*   No results for input(s): LIPASE, AMYLASE in the last 168 hours. No results for input(s): AMMONIA in the last 168 hours. Coagulation Profile: No results for input(s): INR, PROTIME in the last 168 hours. Cardiac Enzymes: Recent Labs  Lab 04/24/18 1811 04/24/18 2240 04/25/18 0429  TROPONINI 0.05* 0.06* 0.07*   BNP (last 3 results) No results for input(s): PROBNP in the last 8760 hours. CBG: Recent Labs  Lab 04/26/18 1614 04/26/18 2119 04/27/18 0550 04/27/18 1115 04/27/18 1607  GLUCAP 111* 130* 134* 90 181*   Studies: No results found.   Time spent: 35 minutes  Author:  Berle Mull, MD [facility] 04/27/2018 5:00 PM  To reach On-call, see care teams to locate the attending and reach out to them via [email or web address]. If 7PM-7AM, please contact night-coverage If you still have difficulty reaching the attending provider, please page the [facility] (Director on Call) for [facility] on amion for assistance.

## 2018-04-27 NOTE — Progress Notes (Signed)
Progress Note  Patient Name: Justin [name] Date of Encounter: 04/27/2018  Primary Cardiologist: Kirk Ruths, MD  New  Subjective   No recent chest pain.  His breathing is slightly better but still not at baseline.  Inpatient Medications    Scheduled Meds: . aspirin  81 mg Oral Daily  . furosemide  40 mg Intravenous Q12H  . heparin  5,000 Units Subcutaneous Q8H  . insulin aspart  0-5 Units Subcutaneous QHS  . insulin aspart  0-9 Units Subcutaneous TID WC  . insulin aspart protamine- aspart  20 Units Subcutaneous Q breakfast  . isosorbide-hydrALAZINE  1 tablet Oral TID  . Living Better with Heart Failure Book   Does not apply Once  . metoprolol succinate  50 mg Oral Daily  . rosuvastatin  40 mg Oral Daily  . sodium chloride flush  3 mL Intravenous Q12H   Continuous Infusions: . sodium chloride     PRN Meds: sodium chloride, acetaminophen, hydrALAZINE, ondansetron (ZOFRAN) IV, sodium chloride flush, zolpidem   Vital Signs    Vitals:   04/27/18 0334 04/27/18 0400 04/27/18 0553 04/27/18 0804  BP: 139/67   (!) 132/56  Pulse: 64 63  63  Resp: 20 20  17   Temp: 98 F (36.7 C)   98 F (36.7 C)  TempSrc: Oral   Oral  SpO2: 97% 98%  97%  Weight:   83.7 kg   Height:        Intake/Output Summary (Last 24 hours) at 04/27/2018 0952 Last data filed at 04/27/2018 0809 Gross per 24 hour  Intake 720 ml  Output 3650 ml  Net -2930 ml   Last 3 Weights 04/27/2018 04/26/2018 04/25/2018  Weight (lbs) 184 lb 8.4 oz 189 lb 2.5 oz 192 lb 3.9 oz  Weight (kg) 83.7 kg 85.8 kg 87.2 kg      Telemetry    Sinus rhythm, T wave inversions noted- Personally Reviewed  ECG    Normal sinus rhythm- Personally Reviewed  Physical Exam   GEN: No acute distress.   Neck: No JVD Cardiac: RRR, 2/6 systolic murmur, no rubs, or gallops.  Respiratory: Clear to auscultation bilaterally. GI: Soft, nontender, non-distended  MS: 1+ edema; No deformity. Neuro:  Nonfocal  Psych: Normal affect    Labs    Chemistry Recent Labs  Lab 04/24/18 1433 04/25/18 0429 04/26/18 0317 04/27/18 0215  NA 143 145 141 139  K 3.7 3.6 3.6 3.6  CL 113* 111 106 103  CO2 21* 20* 21* 24  GLUCOSE 86 81 110* 103*  BUN 52* 56* 60* 70*  CREATININE 6.76* 6.93* 7.04* 7.12*  CALCIUM 7.7* 7.6* 8.0* 7.6*  PROT 5.5*  --   --   --   ALBUMIN 2.9*  --   --   --   AST 25  --   --   --   ALT 26  --   --   --   ALKPHOS 86  --   --   --   BILITOT 0.4  --   --   --   GFRNONAA 8* 7* 7* 7*  GFRAA 9* 9* 8* 8*  ANIONGAP 9 14 14 12      Hematology Recent Labs  Lab 04/24/18 1433 04/25/18 0429 04/27/18 0215  WBC 8.4 9.9 10.1  RBC 4.15* 3.65* 3.63*  HGB 11.7* 10.5* 10.3*  HCT 36.8* 32.5* 32.1*  MCV 88.7 89.0 88.4  MCH 28.2 28.8 28.4  MCHC 31.8 32.3 32.1  RDW 13.6 13.8 13.7  PLT 230 222 217    Cardiac Enzymes Recent Labs  Lab 04/24/18 1811 04/24/18 2240 04/25/18 0429  TROPONINI 0.05* 0.06* 0.07*    Recent Labs  Lab 04/24/18 1455  TROPIPOC 0.05     BNP Recent Labs  Lab 04/24/18 1434  BNP 1,674.1*     DDimer No results for input(s): DDIMER in the last 168 hours.   Radiology    No results found.  Cardiac Studies   EF 35 to 40%-moderate aortic stenosis  Patient Profile     68 y.o. male with EF 35 to 40% here with acute combined systolic and diastolic heart failure with cardiomyopathy of unclear etiology  Assessment & Plan    Acute systolic and diastolic heart failure - Cardiac catheterization.  Possibly early next week.  Continue with diuresis.  Appreciate nephrology's assistance.  Imminent.  He is okay with planning for nonemergent HD after tunneled line is placed.  Another 3 L output.  Creatinine 7.1.  Elevated troponin - Possibly secondary to underlying coronary artery disease as well as heart failure.  Diabetes with hypertension. - Per primary team.  Aortic stenosis -Moderate on echo- right and left heart catheterization would be helpful  Essential hypertension -  Appreciate nephrology's assistance as well.  Avoiding ACE inhibitor.  BiDil and Toprol.  Stage IV-V chronic kidney disease. -Per nephrology.  Appreciate assistance      For questions or updates, please contact [place] Please consult [email or web address] for contact info under        Signed, Candee Furbish, MD  04/27/2018, 9:52 AM

## 2018-04-27 NOTE — Progress Notes (Signed)
[facility] Progress Note  Name: Justin [name] MRN: [id number] DOB: 1950-08-23  Chief Complaint:  Shortness of breath   Subjective:  Pt with 3.8 liters UOP over 4/24.  He has continued with foley.  He confirms that he would want dialysis if indicated and is ok with planning for a non-emergent likely HD start on 4/27 after tunneled line.    Note he last saw Dr. Justin [name] 03/2017 and had Cr 3.27 on 04/2017 labs at our office.  Had 3 grams proteinuria 03/2017.  No showed a renal ultrasound x 3.    Review of systems:  Reports shortness of breath much improved Denies current chest pain  Denies nausea or vomiting  Has a foley (new this hospitalization)  ---------------------------------  Background on consult:   Justin [name] is an 68 y.o. male with BPH, CKD 4 (BL Cr reported 3.1), DM, HL who presents with dyspnea and edema.  Nephrology is consulted for evaluation and management of AKI on CKD.  Pt was in his usual state of health until the last few days noted increasing DOE, LE edema, orthopnea.  Went for PCP visit today and was noted to be hypertensive, edematous and sent to ED.  In ED labs show Cr 6.7, proBNP 1674 (no priors).  CXR with edema and small effusions.  Trop 0.05.  He is admitted for IV diuresis.  He notes no recent med changes, no dietary indiscretion of sodium.  No difficulty voiding but past 2 nights had nocturia q1hr.  No dysgeusia, hiccups, pruritis, difficulty with cognition.   Intake/Output Summary (Last 24 hours) at 04/27/2018 0701 Last data filed at 04/27/2018 6283 Gross per 24 hour  Intake 720 ml  Output 3750 ml  Net -3030 ml    Vitals:  Vitals:   04/27/18 0000 04/27/18 0334 04/27/18 0400 04/27/18 0553  BP:  139/67    Pulse: 62 64 63   Resp: (!) 25 20 20    Temp:  98 F (36.7 C)    TempSrc:  Oral    SpO2: 99% 97% 98%   Weight:    83.7 kg  Height:         Physical Exam:  General adult male in bed in no acute distress  HEENT normocephalic  atraumatic extraocular movements intact sclera anicteric Neck supple trachea midline Lungs clear to auscultation  bilaterally and unlabored Heart regular rate and rhythm no rub Abdomen soft nontender nondistended Extremities lower extremities with 2+ edema bilaterally  Psych normal mood and affect Neuro alert and oriented x 3; conversant and follows commands   Medications reviewed   Labs:  BMP Latest Ref Rng & Units 04/27/2018 04/26/2018 04/25/2018  Glucose 70 - 99 mg/dL 103(H) 110(H) 81  BUN 8 - 23 mg/dL 70(H) 60(H) 56(H)  Creatinine 0.61 - 1.24 mg/dL 7.12(H) 7.04(H) 6.93(H)  Sodium 135 - 145 mmol/L 139 141 145  Potassium 3.5 - 5.1 mmol/L 3.6 3.6 3.6  Chloride 98 - 111 mmol/L 103 106 111  CO2 22 - 32 mmol/L 24 21(L) 20(L)  Calcium 8.9 - 10.3 mg/dL 7.6(L) 8.0(L) 7.6(L)     Assessment/Plan:   # AKI on CKD: - Acute event felt secondary to decompensated HF (potentially diastolic) + hypertensive emergency.  Renal ultrasound without obstruction.  Proteinuria without hematuria.  I suspect he may have had CKD progression to ESRD - Baseline Cr 3.1 and now ~7.   - Nonoliguric  - refractory to supportive measures.   - No acute indication for dialysis.  Anticipate  non-emergent HD start on 4/27 after tunneled catheter placement.    - Would hold RAAS inhibition including ACEi for now in light of severely reduced renal function.   # Proteinuria  - Likely secondary to diabetes.  SPEP and free light chains pending; UPEP is ordered. HIV nonreactive - Note up/cr ratio with 7340 mg/g.  No hematuria  # Chest discomfort  - Cath per cardiology discretion; agree that he has likely progressed to ESRD and will be starting HD soon  # CKD stage IV - baseline Cr is 3.1 from 09/2017 labs   # Acute systolic CHF /Volume overload - Responding to current lasix dosing   # Hypertensive emergency:  - Avoid hypotension  - Blood pressure improving - Holding lisinopril with AKI     # Anemia  - Secondary  in part to CKD - Check iron stores  # HL: on crestor.  Claudia Desanctis, MD 04/27/2018 7:01 AM

## 2018-04-28 ENCOUNTER — Inpatient Hospital Stay (HOSPITAL_COMMUNITY): Payer: Medicare HMO

## 2018-04-28 ENCOUNTER — Encounter (HOSPITAL_COMMUNITY): Payer: Medicare HMO

## 2018-04-28 DIAGNOSIS — N189 Chronic kidney disease, unspecified: Secondary | ICD-10-CM

## 2018-04-28 LAB — BASIC METABOLIC PANEL
Anion gap: 14 (ref 5–15)
BUN: 76 mg/dL — ABNORMAL HIGH (ref 8–23)
CO2: 24 mmol/L (ref 22–32)
Calcium: 7.6 mg/dL — ABNORMAL LOW (ref 8.9–10.3)
Chloride: 102 mmol/L (ref 98–111)
Creatinine, Ser: 7.16 mg/dL — ABNORMAL HIGH (ref 0.61–1.24)
GFR calc Af Amer: 8 mL/min — ABNORMAL LOW (ref >60)
GFR calc non Af Amer: 7 mL/min — ABNORMAL LOW (ref >60)
Glucose, Bld: 127 mg/dL — ABNORMAL HIGH (ref 70–99)
Potassium: 3.7 mmol/L (ref 3.5–5.1)
Sodium: 140 mmol/L (ref 135–145)

## 2018-04-28 LAB — GLUCOSE, CAPILLARY
Glucose-Capillary: 118 mg/dL — ABNORMAL HIGH (ref 70–99)
Glucose-Capillary: 130 mg/dL — ABNORMAL HIGH (ref 70–99)
Glucose-Capillary: 120 mg/dL — ABNORMAL HIGH (ref 70–99)
Glucose-Capillary: 192 mg/dL — ABNORMAL HIGH (ref 70–99)

## 2018-04-28 LAB — CBC
HCT: 31.7 % — ABNORMAL LOW (ref 39.0–52.0)
Hemoglobin: 10.4 g/dL — ABNORMAL LOW (ref 13.0–17.0)
MCH: 28.3 pg (ref 26.0–34.0)
MCHC: 32.8 g/dL (ref 30.0–36.0)
MCV: 86.4 fL (ref 80.0–100.0)
Platelets: 219 10*3/uL (ref 150–400)
RBC: 3.67 MIL/uL — ABNORMAL LOW (ref 4.22–5.81)
RDW: 13.8 % (ref 11.5–15.5)
WBC: 9.6 10*3/uL (ref 4.0–10.5)
nRBC: 0 % (ref 0.0–0.2)

## 2018-04-28 LAB — MAGNESIUM: Magnesium: 2.1 mg/dL (ref 1.7–2.4)

## 2018-04-28 MED ORDER — POLYSACCHARIDE IRON COMPLEX 150 MG PO CAPS
150.0000 mg | ORAL_CAPSULE | Freq: Every day | ORAL | Status: DC
  Administered 2018-04-28: 150 mg via ORAL
  Filled 2018-04-28 (×2): qty 1

## 2018-04-28 MED ORDER — CHLORHEXIDINE GLUCONATE CLOTH 2 % EX PADS
6.0000 | MEDICATED_PAD | Freq: Every day | CUTANEOUS | Status: DC
  Administered 2018-04-29 – 2018-05-02 (×4): 6 via TOPICAL

## 2018-04-28 MED ORDER — HEPARIN SODIUM (PORCINE) 5000 UNIT/ML IJ SOLN
5000.0000 [IU] | Freq: Three times a day (TID) | INTRAMUSCULAR | Status: AC
  Administered 2018-04-28 (×2): 5000 [IU] via SUBCUTANEOUS
  Filled 2018-04-28: qty 1

## 2018-04-28 NOTE — Progress Notes (Signed)
[facility] Progress Note  Name: Justin [name] MRN: [id number] DOB: 12/12/1950  Chief Complaint:  Shortness of breath   Subjective:  He had 3.2 liters UOP over 4/25.  He and I discussed his renal function and he is ok with [place] going ahead and starting dialysis tomorrow given the lack of improvement.  He is also ok with ordering vein mapping and understands he'll be seeing the surgeons re: permanent access placement as well.     Review of systems:  Denies any shortness of breath No nausea/vomiting Eating ok Still urinating via foley  ---------------------------------  Background on consult:   Justin [name] is an 68 y.o. male with BPH, CKD 4 (BL Cr reported 3.1), DM, HL who presents with dyspnea and edema.  Nephrology is consulted for evaluation and management of AKI on CKD.  Pt was in his usual state of health until the last few days noted increasing DOE, LE edema, orthopnea.  Went for PCP visit today and was noted to be hypertensive, edematous and sent to ED.  In ED labs show Cr 6.7, proBNP 1674 (no priors).  CXR with edema and small effusions.  Trop 0.05.  He is admitted for IV diuresis.  He notes no recent med changes, no dietary indiscretion of sodium.  No difficulty voiding but past 2 nights had nocturia q1hr.  No dysgeusia, hiccups, pruritis, difficulty with cognition.  Note he last saw Dr. Justin Mend 03/2017 and had Cr 3.27 on 04/2017 labs at our office.  Had 3 grams proteinuria 03/2017.  No showed a renal ultrasound x 3.    Intake/Output Summary (Last 24 hours) at 04/28/2018 0652 Last data filed at 04/28/2018 0600 Gross per 24 hour  Intake 1200 ml  Output 3175 ml  Net -1975 ml    Vitals:  Vitals:   04/27/18 1900 04/27/18 2300 04/28/18 0000 04/28/18 0400  BP: (!) 130/59 (!) 131/59  140/63  Pulse: 64 61    Resp: (!) 21 18 15 13   Temp: 98.1 F (36.7 C) 98.3 F (36.8 C)  98 F (36.7 C)  TempSrc: Oral Oral  Oral  SpO2: 98% 98%    Weight:    86.2 kg  Height:          Physical Exam:  General adult male in bed in no distress  HEENT normocephalic atraumatic extraocular movements intact sclera anicteric Neck supple trachea midline Lungs clear to auscultation and normal work of breathing at rest; room air Heart RRR; no rub Abdomen soft nontender nondistended Extremities 1+ edema bilateral lower extremities   Psych normal mood and affect Neuro alert and oriented x 3; conversant and follows commands   Medications reviewed   Labs:  BMP Latest Ref Rng & Units 04/28/2018 04/27/2018 04/26/2018  Glucose 70 - 99 mg/dL 127(H) 103(H) 110(H)  BUN 8 - 23 mg/dL 76(H) 70(H) 60(H)  Creatinine 0.61 - 1.24 mg/dL 7.16(H) 7.12(H) 7.04(H)  Sodium 135 - 145 mmol/L 140 139 141  Potassium 3.5 - 5.1 mmol/L 3.7 3.6 3.6  Chloride 98 - 111 mmol/L 102 103 106  CO2 22 - 32 mmol/L 24 24 21(L)  Calcium 8.9 - 10.3 mg/dL 7.6(L) 7.6(L) 8.0(L)     Assessment/Plan:   # AKI on CKD: - Acute event felt secondary to decompensated HF (potentially diastolic) + hypertensive emergency.  Renal ultrasound without obstruction.  Proteinuria without hematuria.  I suspect he may have had CKD progression to ESRD - Baseline Cr 3.1 and now ~7.   - Nonoliguric  -  refractory to supportive measures.   - No acute indication for dialysis today.  Will plan for non-emergent HD start on 4/27 after tunneled catheter placement.  Will consult IR for same  - Ordered vein mapping - Does not yet have an outpatient dialysis unit; nephrology will initiate on 4/27 - NPO after midnight tonight for access placement on 4/27 - Remove foley and monitor for urinary retention  # Proteinuria  - Likely secondary to diabetes.  SPEP and free light chains pending; UPEP is pending. HIV nonreactive - Note up/cr ratio with 7340 mg/g.  No hematuria  # Chest discomfort  - Cath per cardiology discretion; agree that he has likely progressed to ESRD and will be starting HD soon  # CKD stage IV - baseline Cr is 3.1 from  09/2017 labs   # Acute systolic CHF /Volume overload - Responding to current lasix dosing  - Discontinue scheduled lasix   # Hypertensive emergency:  - Avoid hypotension  - Acceptable control  - Have been holding lisinopril with AKI     # Anemia  - Secondary in part to CKD - Iron deficiency anemia - start oral iron for now  # HL: on crestor.  Claudia Desanctis, MD 04/28/2018 6:52 AM

## 2018-04-28 NOTE — Progress Notes (Signed)
Request to IR for tunneled HD catheter placement tentatively scheduled for 4/27.  Orders have been placed for NPO after midnight, heparin SQ held after 2200 dose today, AM labs ordered. PA will see patient for full consult/consent when in IR tomorrow.  Please call with questions or concerns.  Candiss Norse, PA-C Pager# [phone]

## 2018-04-28 NOTE — Progress Notes (Signed)
[facility] Progress Note  Patient: Justin [name] QMG:[id number]   PCP: Reynold Bowen, MD DOB: Aug 07, 1950   DOA: 04/24/2018   DOS: 04/28/2018   Date of Service: the patient was seen and examined on 04/28/2018  Brief hospital course: Pt. with PMH of hypertension, CKD stage IV and DM 2 ; admitted on 04/24/2018, presented with complaint of shortnes of breath, was found to have acute on chronic systolic CHF. Currently further plan is continue IV diuresis.  Subjective: Continues to have shortness of breath.  Reports that he did not sleep last night He did not receive any Ambien last night as well  Assessment and Plan: Acute respiratory distress Acute on chronic systolic CHF Presents with complaints of shortness of breath and lower extremity swelling over the last week.  Patient with 2+ pitting lower extremity edema and crackles on lung exam.  BNP elevated at 1674 .1 and chest x-ray showing interstitial edema.  Patient with no prior history of heart disease. -Strict I&Os and daily weights -Elevate lower extremities -Lasix 40 mg IV Bid - echocardiogram shows decreased EF.  Will require further work-up. Appreciate cardiology assistance, tentatively Tuesday cardiac catheterization  Hypertensive urgency: On admission patient's blood pressure elevated 210/113.  Patient had been given nitroglycerin and lisinopril in the ED.   -Hold lisinopril due to acute kidney injury -Hydralazine IV as needed -Nephrology recommended starting isosorbide -hydralazine 20-37.5 mg 3 times daily  Acute renal failure superimposed on chronic kidney disease stage IV: Previous noted to have creatinine around 3.1, but presents with creatinine 6.76 with BUN 32.  Suspect acute rise could be related with hypoperfusion related with heart failure. -Appreciate nephrology consultative services -Follow-up protein /creatinine ratio -Plan for tap cath placement by IR tomorrow with first HD treatment tomorrow.  We will likely  also get AV fistula placement as well as TDC placement and probably will be clipped at the time of the discharge.  Insulin-dependent diabetes mellitus: Patient normally takes Novolin 70/30 insulin 40 units every morning. -Hypoglycemic protocols -Reduced 70/30 insulin to 20 units daily while in hospital -CBGs q. before meals with sensitive sliding scale insulin -Adjust regimen as needed  Hyperlipidemia -Continue Crestor  Diet: Renal diet DVT Prophylaxis: subcutaneous Heparin  Advance goals of care discussion: Full code  Family Communication: no family was present at bedside, at the time of interview. D/W significant other 04/28/2018, after patient's permission.  Patient has a daughter but they are not involved.  Per significant other family includes parents and brother.  Disposition:  Discharge to home.  Consultants: Nephrology and cardiology Procedures: Echocardiogram  Scheduled Meds:  aspirin  81 mg Oral Daily   Chlorhexidine Gluconate Cloth  6 each Topical Q0600   heparin  5,000 Units Subcutaneous Q8H   insulin aspart  0-5 Units Subcutaneous QHS   insulin aspart  0-9 Units Subcutaneous TID WC   insulin aspart protamine- aspart  20 Units Subcutaneous Q breakfast   iron polysaccharides  150 mg Oral Daily   isosorbide-hydrALAZINE  1 tablet Oral TID   Living Better with Heart Failure Book   Does not apply Once   metoprolol succinate  50 mg Oral Daily   rosuvastatin  40 mg Oral Daily   sodium chloride flush  3 mL Intravenous Q12H   Continuous Infusions:  sodium chloride     PRN Meds: sodium chloride, acetaminophen, hydrALAZINE, ondansetron (ZOFRAN) IV, sodium chloride flush, zolpidem Antibiotics: Anti-infectives (From admission, onward)   None       Objective: Physical Exam: Vitals:  04/28/18 0400 04/28/18 0731 04/28/18 1059 04/28/18 1432  BP: 140/63 137/67 133/66 (!) 141/69  Pulse:  60 62 (!) 59  Resp: 13 15 13 14   Temp: 98 F (36.7 C) 98 F (36.7  C) 98.1 F (36.7 C) 97.9 F (36.6 C)  TempSrc: Oral Oral Oral Oral  SpO2:  98% 100% 100%  Weight: 86.2 kg     Height:        Intake/Output Summary (Last 24 hours) at 04/28/2018 1850 Last data filed at 04/28/2018 1700 Gross per 24 hour  Intake 960 ml  Output 3665 ml  Net -2705 ml   Filed Weights   04/26/18 0357 04/27/18 0553 04/28/18 0400  Weight: 85.8 kg 83.7 kg 86.2 kg   General: Alert, Awake and Oriented to Time, Place and Person. Appear in mild distress, affect appropriate Eyes: PERRL, Conjunctiva normal ENT: Oral Mucosa clear moist. Neck: no JVD, no Abnormal Mass Or lumps Cardiovascular: S1 and S2 Present, no Murmur, Peripheral Pulses Present Respiratory: normal respiratory effort, Bilateral Air entry equal and Decreased, no use of accessory muscle, Clear to Auscultation, no Crackles, no wheezes Abdomen: Bowel Sound present, Soft and no tenderness, no hernia Skin: no redness, no Rash, no induration Extremities: bilateral  Pedal edema, no calf tenderness Neurologic: Grossly no focal neuro deficit. Bilaterally Equal motor strength  Data Reviewed: CBC: Recent Labs  Lab 04/24/18 1433 04/25/18 0429 04/27/18 0215 04/28/18 0230  WBC 8.4 9.9 10.1 9.6  NEUTROABS 6.4 7.6  --   --   HGB 11.7* 10.5* 10.3* 10.4*  HCT 36.8* 32.5* 32.1* 31.7*  MCV 88.7 89.0 88.4 86.4  PLT 230 222 217 093   Basic Metabolic Panel: Recent Labs  Lab 04/24/18 1433 04/25/18 0429 04/26/18 0317 04/27/18 0215 04/28/18 0230  NA 143 145 141 139 140  K 3.7 3.6 3.6 3.6 3.7  CL 113* 111 106 103 102  CO2 21* 20* 21* 24 24  GLUCOSE 86 81 110* 103* 127*  BUN 52* 56* 60* 70* 76*  CREATININE 6.76* 6.93* 7.04* 7.12* 7.16*  CALCIUM 7.7* 7.6* 8.0* 7.6* 7.6*  MG  --   --  1.9 2.0 2.1    Liver Function Tests: Recent Labs  Lab 04/24/18 1433  AST 25  ALT 26  ALKPHOS 86  BILITOT 0.4  PROT 5.5*  ALBUMIN 2.9*   No results for input(s): LIPASE, AMYLASE in the last 168 hours. No results for  input(s): AMMONIA in the last 168 hours. Coagulation Profile: No results for input(s): INR, PROTIME in the last 168 hours. Cardiac Enzymes: Recent Labs  Lab 04/24/18 1811 04/24/18 2240 04/25/18 0429  TROPONINI 0.05* 0.06* 0.07*   BNP (last 3 results) No results for input(s): PROBNP in the last 8760 hours. CBG: Recent Labs  Lab 04/27/18 1607 04/27/18 2044 04/28/18 0628 04/28/18 1110 04/28/18 1601  GLUCAP 181* 173* 118* 130* 120*   Studies: Vas [place] Upper Ext Vein Mapping (pre-op Avf)  Result Date: 04/28/2018 UPPER EXTREMITY VEIN MAPPING  Indications: Pre-access. Performing Technologist: Abram Sander RVS  Examination Guidelines: A complete evaluation includes B-mode imaging, spectral Doppler, color Doppler, and power Doppler as needed of all accessible portions of each vessel. Bilateral testing is considered an integral part of a complete examination. Limited examinations for reoccurring indications may be performed as noted. +-----------------+-------------+----------+--------+  Right Cephalic    Diameter (cm) Depth (cm) Findings  +-----------------+-------------+----------+--------+  Shoulder              0.47  1.29              +-----------------+-------------+----------+--------+  Prox upper arm        0.45         0.89              +-----------------+-------------+----------+--------+  Mid upper arm         0.42         0.31              +-----------------+-------------+----------+--------+  Dist upper arm        0.43         0.37              +-----------------+-------------+----------+--------+  Antecubital fossa     0.47         0.30              +-----------------+-------------+----------+--------+  Prox forearm          0.36         0.38              +-----------------+-------------+----------+--------+  Mid forearm           0.30         0.16              +-----------------+-------------+----------+--------+  Dist forearm          0.29         0.21               +-----------------+-------------+----------+--------+  Wrist                 0.31         0.19              +-----------------+-------------+----------+--------+ +-----------------+-------------+----------+--------+  Right Basilic     Diameter (cm) Depth (cm) Findings  +-----------------+-------------+----------+--------+  Prox upper arm        0.62         1.67              +-----------------+-------------+----------+--------+  Mid upper arm         0.52         1.04              +-----------------+-------------+----------+--------+  Dist upper arm        0.46         0.39              +-----------------+-------------+----------+--------+  Antecubital fossa     0.29         0.34              +-----------------+-------------+----------+--------+  Prox forearm          0.22         0.16              +-----------------+-------------+----------+--------+  Mid forearm           0.23         0.15              +-----------------+-------------+----------+--------+  Distal forearm        0.20         0.17              +-----------------+-------------+----------+--------+ +-----------------+-------------+----------+--------+  Left Cephalic     Diameter (cm) Depth (cm) Findings  +-----------------+-------------+----------+--------+  Shoulder              0.49  1.46              +-----------------+-------------+----------+--------+  Prox upper arm        0.56         1.05              +-----------------+-------------+----------+--------+  Mid upper arm         0.48         0.56              +-----------------+-------------+----------+--------+  Dist upper arm        0.50         0.49              +-----------------+-------------+----------+--------+  Antecubital fossa     0.36         0.46              +-----------------+-------------+----------+--------+  Prox forearm          0.42         0.54              +-----------------+-------------+----------+--------+  Mid forearm           0.34         0.26               +-----------------+-------------+----------+--------+  Dist forearm          0.36         0.19              +-----------------+-------------+----------+--------+  Wrist                 0.39         0.83              +-----------------+-------------+----------+--------+ +-----------------+-------------+----------+--------+  Left Basilic      Diameter (cm) Depth (cm) Findings  +-----------------+-------------+----------+--------+  Prox upper arm        0.40         0.63              +-----------------+-------------+----------+--------+  Mid upper arm         0.40         0.58              +-----------------+-------------+----------+--------+  Dist upper arm        0.37         0.63              +-----------------+-------------+----------+--------+  Antecubital fossa     0.37         0.26              +-----------------+-------------+----------+--------+  Prox forearm          0.28         0.22              +-----------------+-------------+----------+--------+  Mid forearm           0.28         0.21              +-----------------+-------------+----------+--------+  Distal forearm        0.28         0.23              +-----------------+-------------+----------+--------+ *See table(s) above for measurements and observations.  Diagnosing physician: Monica Martinez MD Electronically signed by Monica Martinez MD on 04/28/2018 at 11:57:00 AM.    Final      Time spent: 35  minutes  Author: Berle Mull, MD [facility] 04/28/2018 6:50 PM  To reach On-call, see care teams to locate the attending and reach out to them via [email or web address]. If 7PM-7AM, please contact night-coverage If you still have difficulty reaching the attending provider, please page the [facility] (Director on Call) for [facility] on amion for assistance.

## 2018-04-28 NOTE — Progress Notes (Signed)
Upper vein mapping has been completed.   Preliminary results in CV Proc.   Abram Sander 04/28/2018 10:30 AM

## 2018-04-28 NOTE — Progress Notes (Signed)
Progress Note  Patient Name: Justin [name] Date of Encounter: 04/28/2018  Primary Cardiologist: Kirk Ruths, MD  New  Subjective   No chest pain overnight.  Currently sleeping comfortably.  Awaiting dialysis catheter.  Inpatient Medications    Scheduled Meds: . aspirin  81 mg Oral Daily  . Chlorhexidine Gluconate Cloth  6 each Topical Q0600  . heparin  5,000 Units Subcutaneous Q8H  . insulin aspart  0-5 Units Subcutaneous QHS  . insulin aspart  0-9 Units Subcutaneous TID WC  . insulin aspart protamine- aspart  20 Units Subcutaneous Q breakfast  . iron polysaccharides  150 mg Oral Daily  . isosorbide-hydrALAZINE  1 tablet Oral TID  . Living Better with Heart Failure Book   Does not apply Once  . metoprolol succinate  50 mg Oral Daily  . rosuvastatin  40 mg Oral Daily  . sodium chloride flush  3 mL Intravenous Q12H   Continuous Infusions: . sodium chloride     PRN Meds: sodium chloride, acetaminophen, hydrALAZINE, ondansetron (ZOFRAN) IV, sodium chloride flush, zolpidem   Vital Signs    Vitals:   04/27/18 2300 04/28/18 0000 04/28/18 0400 04/28/18 0731  BP: (!) 131/59  140/63 137/67  Pulse: 61   60  Resp: 18 15 13 15   Temp: 98.3 F (36.8 C)  98 F (36.7 C) 98 F (36.7 C)  TempSrc: Oral  Oral Oral  SpO2: 98%   98%  Weight:   86.2 kg   Height:        Intake/Output Summary (Last 24 hours) at 04/28/2018 0921 Last data filed at 04/28/2018 1884 Gross per 24 hour  Intake 1200 ml  Output 3265 ml  Net -2065 ml   Last 3 Weights 04/28/2018 04/27/2018 04/26/2018  Weight (lbs) 190 lb 0.6 oz 184 lb 8.4 oz 189 lb 2.5 oz  Weight (kg) 86.2 kg 83.7 kg 85.8 kg      Telemetry    Sinus rhythm, T wave inversions noted, no changes- Personally Reviewed  ECG    Normal sinus rhythm- Personally Reviewed  Physical Exam   General: Sleeping comfortably, no increased respiratory effort, calm   Labs    Chemistry Recent Labs  Lab 04/24/18 1433  04/26/18 0317 04/27/18  0215 04/28/18 0230  NA 143   < > 141 139 140  K 3.7   < > 3.6 3.6 3.7  CL 113*   < > 106 103 102  CO2 21*   < > 21* 24 24  GLUCOSE 86   < > 110* 103* 127*  BUN 52*   < > 60* 70* 76*  CREATININE 6.76*   < > 7.04* 7.12* 7.16*  CALCIUM 7.7*   < > 8.0* 7.6* 7.6*  PROT 5.5*  --   --   --   --   ALBUMIN 2.9*  --   --   --   --   AST 25  --   --   --   --   ALT 26  --   --   --   --   ALKPHOS 86  --   --   --   --   BILITOT 0.4  --   --   --   --   GFRNONAA 8*   < > 7* 7* 7*  GFRAA 9*   < > 8* 8* 8*  ANIONGAP 9   < > 14 12 14    < > = values in this interval not displayed.  Hematology Recent Labs  Lab 04/25/18 0429 04/27/18 0215 04/28/18 0230  WBC 9.9 10.1 9.6  RBC 3.65* 3.63* 3.67*  HGB 10.5* 10.3* 10.4*  HCT 32.5* 32.1* 31.7*  MCV 89.0 88.4 86.4  MCH 28.8 28.4 28.3  MCHC 32.3 32.1 32.8  RDW 13.8 13.7 13.8  PLT 222 217 219    Cardiac Enzymes Recent Labs  Lab 04/24/18 1811 04/24/18 2240 04/25/18 0429  TROPONINI 0.05* 0.06* 0.07*    Recent Labs  Lab 04/24/18 1455  TROPIPOC 0.05     BNP Recent Labs  Lab 04/24/18 1434  BNP 1,674.1*     DDimer No results for input(s): DDIMER in the last 168 hours.   Radiology    No results found.  Cardiac Studies   EF 35 to 40%-moderate aortic stenosis  Patient Profile     68 y.o. male with EF 35 to 40% here with acute combined systolic and diastolic heart failure with cardiomyopathy of unclear etiology  Assessment & Plan    Acute systolic and diastolic heart failure - Cardiac catheterization.  Possibly early next week.  Continue with diuresis.  Appreciate nephrology's assistance.  Imminent.  HD catheter planned for tomorrow 4/27.  Another 3.1 L output.  Creatinine 7.1.  May not be a bad idea to think about cardiac catheterization on Tuesday.  Elevated troponin - Possibly secondary to underlying coronary artery disease as well as heart failure, demand ischemia phenomenon.  Diabetes with hypertension. - Per  primary team.  Blood pressure continues to be under good control.  Aortic stenosis -Moderate on echo- right and left heart catheterization would be helpful for evaluation  Essential hypertension - Appreciate nephrology's assistance as well.  Avoiding ACE inhibitor.  BiDil and Toprol.  Overall doing well.  Stage IV-V chronic kidney disease. -Per nephrology.  Appreciate assistance, notes reviewed.  HD catheter planned for tomorrow.      For questions or updates, please contact [place] Please consult [email or web address] for contact info under        Signed, Candee Furbish, MD  04/28/2018, 9:21 AM

## 2018-04-29 ENCOUNTER — Encounter (HOSPITAL_COMMUNITY): Payer: Self-pay | Admitting: Diagnostic Radiology

## 2018-04-29 ENCOUNTER — Inpatient Hospital Stay (HOSPITAL_COMMUNITY): Payer: Medicare HMO

## 2018-04-29 DIAGNOSIS — N185 Chronic kidney disease, stage 5: Secondary | ICD-10-CM

## 2018-04-29 DIAGNOSIS — I10 Essential (primary) hypertension: Secondary | ICD-10-CM

## 2018-04-29 HISTORY — PX: IR FLUORO GUIDE CV LINE RIGHT: IMG2283

## 2018-04-29 HISTORY — PX: IR US GUIDE VASC ACCESS RIGHT: IMG2390

## 2018-04-29 LAB — BASIC METABOLIC PANEL
Anion gap: 12 (ref 5–15)
BUN: 80 mg/dL — ABNORMAL HIGH (ref 8–23)
CO2: 25 mmol/L (ref 22–32)
Calcium: 7.7 mg/dL — ABNORMAL LOW (ref 8.9–10.3)
Chloride: 104 mmol/L (ref 98–111)
Creatinine, Ser: 7.25 mg/dL — ABNORMAL HIGH (ref 0.61–1.24)
GFR calc Af Amer: 8 mL/min — ABNORMAL LOW (ref >60)
GFR calc non Af Amer: 7 mL/min — ABNORMAL LOW (ref >60)
Glucose, Bld: 117 mg/dL — ABNORMAL HIGH (ref 70–99)
Potassium: 3.7 mmol/L (ref 3.5–5.1)
Sodium: 141 mmol/L (ref 135–145)

## 2018-04-29 LAB — CBC
HCT: 31 % — ABNORMAL LOW (ref 39.0–52.0)
Hemoglobin: 10.1 g/dL — ABNORMAL LOW (ref 13.0–17.0)
MCH: 28.3 pg (ref 26.0–34.0)
MCHC: 32.6 g/dL (ref 30.0–36.0)
MCV: 86.8 fL (ref 80.0–100.0)
Platelets: 231 10*3/uL (ref 150–400)
RBC: 3.57 MIL/uL — ABNORMAL LOW (ref 4.22–5.81)
RDW: 13.7 % (ref 11.5–15.5)
WBC: 8.5 10*3/uL (ref 4.0–10.5)
nRBC: 0 % (ref 0.0–0.2)

## 2018-04-29 LAB — PROTEIN ELECTROPHORESIS, SERUM
A/G Ratio: 1.1 (ref 0.7–1.7)
Albumin ELP: 2.6 g/dL — ABNORMAL LOW (ref 2.9–4.4)
Alpha-1-Globulin: 0.3 g/dL (ref 0.0–0.4)
Alpha-2-Globulin: 0.8 g/dL (ref 0.4–1.0)
Beta Globulin: 0.7 g/dL (ref 0.7–1.3)
Gamma Globulin: 0.6 g/dL (ref 0.4–1.8)
Globulin, Total: 2.3 g/dL (ref 2.2–3.9)
Total Protein ELP: 4.9 g/dL — ABNORMAL LOW (ref 6.0–8.5)

## 2018-04-29 LAB — KAPPA/LAMBDA LIGHT CHAINS
Kappa free light chain: 102.4 mg/L — ABNORMAL HIGH (ref 3.3–19.4)
Kappa, lambda light chain ratio: 2.3 — ABNORMAL HIGH (ref 0.26–1.65)
Lambda free light chains: 44.6 mg/L — ABNORMAL HIGH (ref 5.7–26.3)

## 2018-04-29 LAB — GLUCOSE, CAPILLARY
Glucose-Capillary: 121 mg/dL — ABNORMAL HIGH (ref 70–99)
Glucose-Capillary: 111 mg/dL — ABNORMAL HIGH (ref 70–99)
Glucose-Capillary: 146 mg/dL — ABNORMAL HIGH (ref 70–99)

## 2018-04-29 LAB — HEPATITIS B SURFACE ANTIGEN: Hepatitis B Surface Ag: NEGATIVE

## 2018-04-29 LAB — MAGNESIUM: Magnesium: 2.2 mg/dL (ref 1.7–2.4)

## 2018-04-29 LAB — PROTIME-INR
INR: 1.3 — ABNORMAL HIGH (ref 0.8–1.2)
Prothrombin Time: 15.6 seconds — ABNORMAL HIGH (ref 11.4–15.2)

## 2018-04-29 MED ORDER — LIDOCAINE HCL (PF) 1 % IJ SOLN
INTRAMUSCULAR | Status: AC | PRN
  Administered 2018-04-29: 5 mL

## 2018-04-29 MED ORDER — HEPARIN SODIUM (PORCINE) 1000 UNIT/ML DIALYSIS
20.0000 [IU]/kg | INTRAMUSCULAR | Status: DC | PRN
  Administered 2018-04-29: 16:00:00 1600 [IU] via INTRAVENOUS_CENTRAL
  Filled 2018-04-29: qty 1.6

## 2018-04-29 MED ORDER — CEFAZOLIN SODIUM-DEXTROSE 2-4 GM/100ML-% IV SOLN
INTRAVENOUS | Status: AC
  Administered 2018-04-29: 2 g via INTRAVENOUS
  Filled 2018-04-29: qty 100

## 2018-04-29 MED ORDER — FENTANYL CITRATE (PF) 100 MCG/2ML IJ SOLN
INTRAMUSCULAR | Status: AC | PRN
  Administered 2018-04-29: 25 ug via INTRAVENOUS

## 2018-04-29 MED ORDER — GELATIN ABSORBABLE 12-7 MM EX MISC
CUTANEOUS | Status: AC
  Filled 2018-04-29: qty 1

## 2018-04-29 MED ORDER — MIDAZOLAM HCL 2 MG/2ML IJ SOLN
INTRAMUSCULAR | Status: AC
  Filled 2018-04-29: qty 2

## 2018-04-29 MED ORDER — FENTANYL CITRATE (PF) 100 MCG/2ML IJ SOLN
INTRAMUSCULAR | Status: AC
  Filled 2018-04-29: qty 2

## 2018-04-29 MED ORDER — SODIUM CHLORIDE 0.9 % IV SOLN
125.0000 mg | INTRAVENOUS | Status: DC
  Administered 2018-04-29: 125 mg via INTRAVENOUS
  Filled 2018-04-29 (×2): qty 10

## 2018-04-29 MED ORDER — HEPARIN SODIUM (PORCINE) 1000 UNIT/ML IJ SOLN
INTRAMUSCULAR | Status: AC
  Administered 2018-04-29: 1600 [IU] via INTRAVENOUS_CENTRAL
  Filled 2018-04-29: qty 4

## 2018-04-29 MED ORDER — HEPARIN SODIUM (PORCINE) 1000 UNIT/ML IJ SOLN
INTRAMUSCULAR | Status: AC
  Administered 2018-04-29: 3.8 mL
  Filled 2018-04-29: qty 1

## 2018-04-29 MED ORDER — LIDOCAINE HCL 1 % IJ SOLN
INTRAMUSCULAR | Status: AC
  Filled 2018-04-29: qty 20

## 2018-04-29 MED ORDER — CEFAZOLIN SODIUM-DEXTROSE 2-4 GM/100ML-% IV SOLN
2.0000 g | INTRAVENOUS | Status: AC
  Administered 2018-04-29: 13:00:00 2 g via INTRAVENOUS
  Filled 2018-04-29: qty 100

## 2018-04-29 MED ORDER — MIDAZOLAM HCL 2 MG/2ML IJ SOLN
INTRAMUSCULAR | Status: AC | PRN
  Administered 2018-04-29: 1 mg via INTRAVENOUS

## 2018-04-29 NOTE — Progress Notes (Signed)
Patient still not back to his room due to to more procedures today. Renal Navigator notes that he is in HD now. Renal Navigator contacted acute HD unit and requested that RN verify address and SSN in order for referral for OP HD seat to be sent. Renal Navigator asked that patient contact Renal Navigator with any questions. [facility] Admissions Coordinator will submit referral to [facility], which is the OP HD clinic closest to patient's home. Renal Navigator will update Nephrologist once an OP HD clinic has been secured and will follow patient through discharge to ensure a smooth transition between hospital and clinic.   Alphonzo Cruise Renal Navigator [phone]

## 2018-04-29 NOTE — H&P (View-Only) (Signed)
Hospital Consult    Reason for Consult:  Permanent dialysis access Requesting Physician:  Dr. Joelyn Oms MRN #:  [id number]  History of Present Illness: This is a 68 y.o. male with past medical history significant for hypertension, hyperlipidemia, diabetes, chronic kidney disease having progressed to end-stage renal disease now requiring hemodialysis.  He is being seen in consultation for evaluation for permanent dialysis access.  He is scheduled to have tunneled dialysis catheter placed by interventional radiology today 04/29/2018.  He is right arm dominant and would prefer dialysis access creation in left arm.  Vein mapping performed.  Suitable conduits for fistula use include right cephalic, basilic and left cephalic, basilic vein.  He currently has an IV in his left forearm.  He is not taking any blood thinners.  He does not have a pacemaker.  Past Medical History:  Diagnosis Date  . BPH (benign prostatic hyperplasia)   . Chronic kidney disease (CKD), stage IV (severe) ([place])   . Diabetes ([place])   . Dysuria   . Erectile dysfunction   . Hyperlipemia   . Hypertension   . Pinna disorder, left   . Renal disorder     History reviewed. No pertinent surgical history.  No Known Allergies  Prior to Admission medications   Medication Sig Start Date End Date Taking? Authorizing Provider  lisinopril (ZESTRIL) 5 MG tablet Take 5 mg by mouth daily. 03/08/18  Yes [provider]  NOVOLIN 70/30 RELION (70-30) 100 UNIT/ML injection Inject 40 Units into the skin daily.  03/08/18  Yes [provider]  rosuvastatin (CRESTOR) 5 MG tablet Take 5 mg by mouth daily. 03/08/18  Yes [provider]    Social History   Socioeconomic History  . Marital status: Divorced    Spouse name: Not on file  . Number of children: Not on file  . Years of education: Not on file  . Highest education level: Not on file  Occupational History  . Not on file  Social Needs  . Financial resource  strain: Not on file  . Food insecurity:    Worry: Not on file    Inability: Not on file  . Transportation needs:    Medical: Not on file    Non-medical: Not on file  Tobacco Use  . Smoking status: Never Smoker  . Smokeless tobacco: Never Used  Substance and Sexual Activity  . Alcohol use: Not Currently  . Drug use: Never  . Sexual activity: Not Currently  Lifestyle  . Physical activity:    Days per week: Not on file    Minutes per session: Not on file  . Stress: Not on file  Relationships  . Social connections:    Talks on phone: Not on file    Gets together: Not on file    Attends religious service: Not on file    Active member of club or organization: Not on file    Attends meetings of clubs or organizations: Not on file    Relationship status: Not on file  . Intimate partner violence:    Fear of current or ex partner: Not on file    Emotionally abused: Not on file    Physically abused: Not on file    Forced sexual activity: Not on file  Other Topics Concern  . Not on file  Social History Narrative  . Not on file     History reviewed. No pertinent family history.  ROS: Otherwise negative unless mentioned in HPI  Physical Examination  Vitals:   04/29/18 0734 04/29/18 0858  BP: (!) 142/62   Pulse: 67 96  Resp:  16  Temp: 98.1 F (36.7 C)   SpO2: 96% 96%   Body mass index is 26.34 kg/m.  General:  WDWN in NAD Gait: Not observed HENT: WNL, normocephalic Pulmonary: normal non-labored breathing Cardiac: regular Abdomen: soft, NT/ND, no masses Skin: without rashes Vascular Exam/Pulses: Symmetrical radial pulses Extremities: without ischemic changes, without Gangrene , without cellulitis; without open wounds; mild lower extremity edema symmetrical Musculoskeletal: no muscle wasting or atrophy  Neurologic: A&O X 3;  No focal weakness or paresthesias are detected; speech is fluent/normal Psychiatric:  The pt has Normal affect. Lymph:  Unremarkable  CBC     Component Value Date/Time   WBC 8.5 04/29/2018 0239   RBC 3.57 (L) 04/29/2018 0239   HGB 10.1 (L) 04/29/2018 0239   HCT 31.0 (L) 04/29/2018 0239   PLT 231 04/29/2018 0239   MCV 86.8 04/29/2018 0239   MCH 28.3 04/29/2018 0239   MCHC 32.6 04/29/2018 0239   RDW 13.7 04/29/2018 0239   LYMPHSABS 1.2 04/25/2018 0429   MONOABS 0.9 04/25/2018 0429   EOSABS 0.1 04/25/2018 0429   BASOSABS 0.0 04/25/2018 0429    BMET    Component Value Date/Time   NA 141 04/29/2018 0239   K 3.7 04/29/2018 0239   CL 104 04/29/2018 0239   CO2 25 04/29/2018 0239   GLUCOSE 117 (H) 04/29/2018 0239   BUN 80 (H) 04/29/2018 0239   CREATININE 7.25 (H) 04/29/2018 0239   CALCIUM 7.7 (L) 04/29/2018 0239   GFRNONAA 7 (L) 04/29/2018 0239   GFRAA 8 (L) 04/29/2018 0239    COAGS: Lab Results  Component Value Date   INR 1.3 (H) 04/29/2018     Non-Invasive Vascular Imaging:   Right cephalic and basilic veins adequate for conduit use Left cephalic and basilic veins adequate for conduit use   ASSESSMENT/PLAN: This is a 69 y.o. male with progression of CKD to end-stage renal disease requiring hemodialysis  -Plans noted for placement of TDC by interventional radiology today -Vein mapping performed; suitable conduits include right cephalic, basilic veins and left cephalic, basilic veins -Restrict left arm -Plan will be for left arm AV fistula creation next available -On call vascular surgeon Dr. Oneida Alar will evaluate the patient later today and provide further treatment plan   Dagoberto Ligas PA-C [facility] [phone]   History and exam details as above.  2+ brachial radial pulse bilaterally.  Adequate cephalic vein 3-4 mm bilaterally.  Need to remove IVs from left arm Will place left radial cephalic AV fistula tomorrow We are available to place [facility] at same time if IR is not able to get to it today  Ruta Hinds, MD [facility] [place]:  [phone] Pager: [phone]

## 2018-04-29 NOTE — Procedures (Signed)
Interventional Radiology Procedure:   Indications: ESRD and starting dialysis  Procedure: Placement of tunneled dialysis catheter  Findings: Right jugular access.  23 cm tip to cuff Palindrome catheter, tip at SVC/RA junction  Complications: None     EBL: Less than 10 ml  Plan: Catheter is ready to use.     [place] R. Anselm Pancoast, MD  Pager: [phone]

## 2018-04-29 NOTE — Consult Note (Addendum)
Hospital Consult    Reason for Consult:  Permanent dialysis access Requesting Physician:  Dr. Joelyn Oms MRN #:  [id number]  History of Present Illness: This is a 68 y.o. male with past medical history significant for hypertension, hyperlipidemia, diabetes, chronic kidney disease having progressed to end-stage renal disease now requiring hemodialysis.  He is being seen in consultation for evaluation for permanent dialysis access.  He is scheduled to have tunneled dialysis catheter placed by interventional radiology today 04/29/2018.  He is right arm dominant and would prefer dialysis access creation in left arm.  Vein mapping performed.  Suitable conduits for fistula use include right cephalic, basilic and left cephalic, basilic vein.  He currently has an IV in his left forearm.  He is not taking any blood thinners.  He does not have a pacemaker.  Past Medical History:  Diagnosis Date  . BPH (benign prostatic hyperplasia)   . Chronic kidney disease (CKD), stage IV (severe) ([place])   . Diabetes ([place])   . Dysuria   . Erectile dysfunction   . Hyperlipemia   . Hypertension   . Pinna disorder, left   . Renal disorder     History reviewed. No pertinent surgical history.  No Known Allergies  Prior to Admission medications   Medication Sig Start Date End Date Taking? Authorizing Provider  lisinopril (ZESTRIL) 5 MG tablet Take 5 mg by mouth daily. 03/08/18  Yes [provider]  NOVOLIN 70/30 RELION (70-30) 100 UNIT/ML injection Inject 40 Units into the skin daily.  03/08/18  Yes [provider]  rosuvastatin (CRESTOR) 5 MG tablet Take 5 mg by mouth daily. 03/08/18  Yes [provider]    Social History   Socioeconomic History  . Marital status: Divorced    Spouse name: Not on file  . Number of children: Not on file  . Years of education: Not on file  . Highest education level: Not on file  Occupational History  . Not on file  Social Needs  . Financial resource  strain: Not on file  . Food insecurity:    Worry: Not on file    Inability: Not on file  . Transportation needs:    Medical: Not on file    Non-medical: Not on file  Tobacco Use  . Smoking status: Never Smoker  . Smokeless tobacco: Never Used  Substance and Sexual Activity  . Alcohol use: Not Currently  . Drug use: Never  . Sexual activity: Not Currently  Lifestyle  . Physical activity:    Days per week: Not on file    Minutes per session: Not on file  . Stress: Not on file  Relationships  . Social connections:    Talks on phone: Not on file    Gets together: Not on file    Attends religious service: Not on file    Active member of club or organization: Not on file    Attends meetings of clubs or organizations: Not on file    Relationship status: Not on file  . Intimate partner violence:    Fear of current or ex partner: Not on file    Emotionally abused: Not on file    Physically abused: Not on file    Forced sexual activity: Not on file  Other Topics Concern  . Not on file  Social History Narrative  . Not on file     History reviewed. No pertinent family history.  ROS: Otherwise negative unless mentioned in HPI  Physical Examination  Vitals:   04/29/18 0734 04/29/18 0858  BP: (!) 142/62   Pulse: 67 96  Resp:  16  Temp: 98.1 F (36.7 C)   SpO2: 96% 96%   Body mass index is 26.34 kg/m.  General:  WDWN in NAD Gait: Not observed HENT: WNL, normocephalic Pulmonary: normal non-labored breathing Cardiac: regular Abdomen: soft, NT/ND, no masses Skin: without rashes Vascular Exam/Pulses: Symmetrical radial pulses Extremities: without ischemic changes, without Gangrene , without cellulitis; without open wounds; mild lower extremity edema symmetrical Musculoskeletal: no muscle wasting or atrophy  Neurologic: A&O X 3;  No focal weakness or paresthesias are detected; speech is fluent/normal Psychiatric:  The pt has Normal affect. Lymph:  Unremarkable  CBC     Component Value Date/Time   WBC 8.5 04/29/2018 0239   RBC 3.57 (L) 04/29/2018 0239   HGB 10.1 (L) 04/29/2018 0239   HCT 31.0 (L) 04/29/2018 0239   PLT 231 04/29/2018 0239   MCV 86.8 04/29/2018 0239   MCH 28.3 04/29/2018 0239   MCHC 32.6 04/29/2018 0239   RDW 13.7 04/29/2018 0239   LYMPHSABS 1.2 04/25/2018 0429   MONOABS 0.9 04/25/2018 0429   EOSABS 0.1 04/25/2018 0429   BASOSABS 0.0 04/25/2018 0429    BMET    Component Value Date/Time   NA 141 04/29/2018 0239   K 3.7 04/29/2018 0239   CL 104 04/29/2018 0239   CO2 25 04/29/2018 0239   GLUCOSE 117 (H) 04/29/2018 0239   BUN 80 (H) 04/29/2018 0239   CREATININE 7.25 (H) 04/29/2018 0239   CALCIUM 7.7 (L) 04/29/2018 0239   GFRNONAA 7 (L) 04/29/2018 0239   GFRAA 8 (L) 04/29/2018 0239    COAGS: Lab Results  Component Value Date   INR 1.3 (H) 04/29/2018     Non-Invasive Vascular Imaging:   Right cephalic and basilic veins adequate for conduit use Left cephalic and basilic veins adequate for conduit use   ASSESSMENT/PLAN: This is a 68 y.o. male with progression of CKD to end-stage renal disease requiring hemodialysis  -Plans noted for placement of TDC by interventional radiology today -Vein mapping performed; suitable conduits include right cephalic, basilic veins and left cephalic, basilic veins -Restrict left arm -Plan will be for left arm AV fistula creation next available -On call vascular surgeon Dr. Oneida Alar will evaluate the patient later today and provide further treatment plan   Dagoberto Ligas PA-C [facility] [phone]   History and exam details as above.  2+ brachial radial pulse bilaterally.  Adequate cephalic vein 3-4 mm bilaterally.  Need to remove IVs from left arm Will place left radial cephalic AV fistula tomorrow We are available to place [facility] at same time if IR is not able to get to it today  Ruta Hinds, MD [facility] [place]:  [phone] Pager: [phone]

## 2018-04-29 NOTE — Care Management Important Message (Signed)
Important Message  Patient Details  Name: Justin [name] MRN: [id number] Date of Birth: 12-01-1950   Medicare Important Message Given:  Yes    Orbie Pyo 04/29/2018, 2:51 PM

## 2018-04-29 NOTE — Progress Notes (Signed)
Admit: 04/24/2018 LOS: 5  74M new ESRD, prev CKD4  Subjective:  . For [facility] and HD #1 today . S/p vein mapping . Feels well this AM, says LEE improved, ready to start HD   04/26 0701 - 04/27 0700 In: 920 [P.O.:920] Out: 2140 [Urine:2140]  Filed Weights   04/27/18 0553 04/28/18 0400 04/29/18 0500  Weight: 83.7 kg 86.2 kg 80.9 kg    Scheduled Meds: . aspirin  81 mg Oral Daily  . Chlorhexidine Gluconate Cloth  6 each Topical Q0600  . insulin aspart  0-5 Units Subcutaneous QHS  . insulin aspart  0-9 Units Subcutaneous TID WC  . insulin aspart protamine- aspart  20 Units Subcutaneous Q breakfast  . iron polysaccharides  150 mg Oral Daily  . isosorbide-hydrALAZINE  1 tablet Oral TID  . Living Better with Heart Failure Book   Does not apply Once  . metoprolol succinate  50 mg Oral Daily  . rosuvastatin  40 mg Oral Daily  . sodium chloride flush  3 mL Intravenous Q12H   Continuous Infusions: . sodium chloride     PRN Meds:.sodium chloride, acetaminophen, hydrALAZINE, ondansetron (ZOFRAN) IV, sodium chloride flush, zolpidem  Current Labs: reviewed    Physical Exam:  Blood pressure (!) 142/62, pulse 67, temperature 98.1 F (36.7 C), temperature source Oral, resp. rate 18, height 5\' 9"  (1.753 m), weight 80.9 kg, SpO2 96 %. NAD, lyinig flat  RRR nl s1s2 CTAB 1+ LEE S/nt/nd nonfocal EOMI NCAT  A 1. AoCKD4, now ESRD; neg [place] during admission 2. Nephrotic proteinuria 3. Aoc sCHF, some responsiveness to  Diuretics 4. HTN, urgent, improved 5. Anemia: TSAT 17% and Ferritin 139 6. CKD BMD  P . HD #1 today after TDC, 3K, 2h, no UF Qb 200 . HD #2 tomorrow: TDC, 3K, 2.5h, 1L UF, Qb 250, tight heparin . Consult VVS . CLIP . Start IV Fe; stop PO . Check PTH and Phos . Medication Issues; o Preferred narcotic agents for pain control are hydromorphone, fentanyl, and methadone. Morphine should not be used.  o Baclofen should be avoided o Avoid oral sodium phosphate and magnesium  citrate based laxatives /  bowel preps    [name] MD 04/29/2018, 8:28 AM  Recent Labs  Lab 04/27/18 0215 04/28/18 0230 04/29/18 0239  NA 139 140 141  K 3.6 3.7 3.7  CL 103 102 104  CO2 24 24 25   GLUCOSE 103* 127* 117*  BUN 70* 76* 80*  CREATININE 7.12* 7.16* 7.25*  CALCIUM 7.6* 7.6* 7.7*   Recent Labs  Lab 04/24/18 1433 04/25/18 0429 04/27/18 0215 04/28/18 0230 04/29/18 0239  WBC 8.4 9.9 10.1 9.6 8.5  NEUTROABS 6.4 7.6  --   --   --   HGB 11.7* 10.5* 10.3* 10.4* 10.1*  HCT 36.8* 32.5* 32.1* 31.7* 31.0*  MCV 88.7 89.0 88.4 86.4 86.8  PLT 230 222 217 219 231

## 2018-04-29 NOTE — Consult Note (Signed)
Chief Complaint: Patient was seen in consultation today for tunneled dialysis catheter placement Chief Complaint  Patient presents with  . Shortness of Breath  . Chest Pain   at the request of Dr Alfonso Ellis   Supervising Physician: Markus Daft  Patient Status: [facility] - In-pt  History of Present Illness: Justin [name] is a 68 y.o. male   CKD 4 Now ESRD Nephrotic proteinuria Vein mapping ongoing To start dialysis today  Scheduled for tunneled dialysis catheter placement   Past Medical History:  Diagnosis Date  . BPH (benign prostatic hyperplasia)   . Chronic kidney disease (CKD), stage IV (severe) ([place])   . Diabetes ([place])   . Dysuria   . Erectile dysfunction   . Hyperlipemia   . Hypertension   . Pinna disorder, left   . Renal disorder     History reviewed. No pertinent surgical history.  Allergies: Patient has no known allergies.  Medications: Prior to Admission medications   Medication Sig Start Date End Date Taking? Authorizing Provider  lisinopril (ZESTRIL) 5 MG tablet Take 5 mg by mouth daily. 03/08/18  Yes [provider]  NOVOLIN 70/30 RELION (70-30) 100 UNIT/ML injection Inject 40 Units into the skin daily.  03/08/18  Yes [provider]  rosuvastatin (CRESTOR) 5 MG tablet Take 5 mg by mouth daily. 03/08/18  Yes [provider]     History reviewed. No pertinent family history.  Social History   Socioeconomic History  . Marital status: Divorced    Spouse name: Not on file  . Number of children: Not on file  . Years of education: Not on file  . Highest education level: Not on file  Occupational History  . Not on file  Social Needs  . Financial resource strain: Not on file  . Food insecurity:    Worry: Not on file    Inability: Not on file  . Transportation needs:    Medical: Not on file    Non-medical: Not on file  Tobacco Use  . Smoking status: Never Smoker  . Smokeless tobacco: Never Used  Substance and Sexual  Activity  . Alcohol use: Not Currently  . Drug use: Never  . Sexual activity: Not Currently  Lifestyle  . Physical activity:    Days per week: Not on file    Minutes per session: Not on file  . Stress: Not on file  Relationships  . Social connections:    Talks on phone: Not on file    Gets together: Not on file    Attends religious service: Not on file    Active member of club or organization: Not on file    Attends meetings of clubs or organizations: Not on file    Relationship status: Not on file  Other Topics Concern  . Not on file  Social History Narrative  . Not on file    Review of Systems: A 12 point ROS discussed and pertinent positives are indicated in the HPI above.  All other systems are negative.  Review of Systems  Constitutional: Positive for activity change and fatigue. Negative for appetite change and fever.  Respiratory: Negative for cough and shortness of breath.   Gastrointestinal: Positive for nausea. Negative for abdominal pain.  Neurological: Negative for weakness.  Psychiatric/Behavioral: Negative for confusion and decreased concentration.    Vital Signs: BP (!) 142/62 (BP Location: Right Arm)   Pulse 96   Temp 98.1 F (36.7 C) (Oral)   Resp 16  Ht 5\' 9"  (1.753 m)   Wt 178 lb 5.6 oz (80.9 kg)   SpO2 96%   BMI 26.34 kg/m   Physical Exam Vitals signs reviewed.  Cardiovascular:     Rate and Rhythm: Normal rate and regular rhythm.  Pulmonary:     Effort: Pulmonary effort is normal.     Breath sounds: Normal breath sounds.  Abdominal:     General: Bowel sounds are normal.     Palpations: Abdomen is soft.  Skin:    General: Skin is dry.  Neurological:     Mental Status: He is alert and oriented to person, place, and time.  Psychiatric:        Behavior: Behavior normal.     Imaging: US Renal  Result Date: 04/25/2018 CLINICAL DATA:  Acute renal failure EXAM: RENAL / URINARY TRACT ULTRASOUND COMPLETE COMPARISON:  None. FINDINGS: Right  Kidney: Renal measurements: 8.9 x 4.8 x 4.9 cm = volume: 111 mL. Increased echotexture and cortical thinning. No mass or hydronephrosis. Left Kidney: Renal measurements: 10.8 x 5.1 x 5.8 cm = volume: 173 mL. Increased echotexture. No mass or hydronephrosis. Bladder: Decompressed with Foley catheter in place. Incidentally noted are bilateral pleural effusions. IMPRESSION: Increased echotexture in the kidneys bilaterally compatible with chronic medical renal disease. No acute findings or hydronephrosis. Electronically Signed   By: Rolm Baptise M.D.   On: 04/25/2018 00:39   Dg Chest Port 1 View  Result Date: 04/24/2018 CLINICAL DATA:  Chest pain and shortness of breath. Bilateral lower extremity swelling. EXAM: PORTABLE CHEST 1 VIEW COMPARISON:  None. FINDINGS: Mild cardiomegaly. Atherosclerotic calcification of the aortic arch. Pulmonary vascular congestion and diffuse interstitial thickening. Small right greater than left pleural effusions and right greater than left basilar atelectasis. No pneumothorax. No acute osseous abnormality. IMPRESSION: 1. Cardiomegaly with mild interstitial pulmonary edema and small bilateral pleural effusions, consistent with congestive heart failure. 2.  Aortic atherosclerosis (ICD10-I70.0). Electronically Signed   By: Titus Dubin M.D.   On: 04/24/2018 14:57   Vas [place] Upper Ext Vein Mapping (pre-op Avf)  Result Date: 04/28/2018 UPPER EXTREMITY VEIN MAPPING  Indications: Pre-access. Performing Technologist: Abram Sander RVS  Examination Guidelines: A complete evaluation includes B-mode imaging, spectral Doppler, color Doppler, and power Doppler as needed of all accessible portions of each vessel. Bilateral testing is considered an integral part of a complete examination. Limited examinations for reoccurring indications may be performed as noted. +-----------------+-------------+----------+--------+ Right Cephalic   Diameter (cm)Depth (cm)Findings  +-----------------+-------------+----------+--------+ Shoulder             0.47        1.29            +-----------------+-------------+----------+--------+ Prox upper arm       0.45        0.89            +-----------------+-------------+----------+--------+ Mid upper arm        0.42        0.31            +-----------------+-------------+----------+--------+ Dist upper arm       0.43        0.37            +-----------------+-------------+----------+--------+ Antecubital fossa    0.47        0.30            +-----------------+-------------+----------+--------+ Prox forearm         0.36        0.38            +-----------------+-------------+----------+--------+  Mid forearm          0.30        0.16            +-----------------+-------------+----------+--------+ Dist forearm         0.29        0.21            +-----------------+-------------+----------+--------+ Wrist                0.31        0.19            +-----------------+-------------+----------+--------+ +-----------------+-------------+----------+--------+ Right Basilic    Diameter (cm)Depth (cm)Findings +-----------------+-------------+----------+--------+ Prox upper arm       0.62        1.67            +-----------------+-------------+----------+--------+ Mid upper arm        0.52        1.04            +-----------------+-------------+----------+--------+ Dist upper arm       0.46        0.39            +-----------------+-------------+----------+--------+ Antecubital fossa    0.29        0.34            +-----------------+-------------+----------+--------+ Prox forearm         0.22        0.16            +-----------------+-------------+----------+--------+ Mid forearm          0.23        0.15            +-----------------+-------------+----------+--------+ Distal forearm       0.20        0.17            +-----------------+-------------+----------+--------+  +-----------------+-------------+----------+--------+ Left Cephalic    Diameter (cm)Depth (cm)Findings +-----------------+-------------+----------+--------+ Shoulder             0.49        1.46            +-----------------+-------------+----------+--------+ Prox upper arm       0.56        1.05            +-----------------+-------------+----------+--------+ Mid upper arm        0.48        0.56            +-----------------+-------------+----------+--------+ Dist upper arm       0.50        0.49            +-----------------+-------------+----------+--------+ Antecubital fossa    0.36        0.46            +-----------------+-------------+----------+--------+ Prox forearm         0.42        0.54            +-----------------+-------------+----------+--------+ Mid forearm          0.34        0.26            +-----------------+-------------+----------+--------+ Dist forearm         0.36        0.19            +-----------------+-------------+----------+--------+ Wrist                0.39        0.83            +-----------------+-------------+----------+--------+ +-----------------+-------------+----------+--------+  Left Basilic     Diameter (cm)Depth (cm)Findings +-----------------+-------------+----------+--------+ Prox upper arm       0.40        0.63            +-----------------+-------------+----------+--------+ Mid upper arm        0.40        0.58            +-----------------+-------------+----------+--------+ Dist upper arm       0.37        0.63            +-----------------+-------------+----------+--------+ Antecubital fossa    0.37        0.26            +-----------------+-------------+----------+--------+ Prox forearm         0.28        0.22            +-----------------+-------------+----------+--------+ Mid forearm          0.28        0.21            +-----------------+-------------+----------+--------+  Distal forearm       0.28        0.23            +-----------------+-------------+----------+--------+ *See table(s) above for measurements and observations.  Diagnosing physician: Monica Martinez MD Electronically signed by Monica Martinez MD on 04/28/2018 at 11:57:00 AM.    Final     Labs:  CBC: Recent Labs    04/25/18 0429 04/27/18 0215 04/28/18 0230 04/29/18 0239  WBC 9.9 10.1 9.6 8.5  HGB 10.5* 10.3* 10.4* 10.1*  HCT 32.5* 32.1* 31.7* 31.0*  PLT 222 217 219 231    COAGS: Recent Labs    04/29/18 0239  INR 1.3*    BMP: Recent Labs    04/26/18 0317 04/27/18 0215 04/28/18 0230 04/29/18 0239  NA 141 139 140 141  K 3.6 3.6 3.7 3.7  CL 106 103 102 104  CO2 21* 24 24 25   GLUCOSE 110* 103* 127* 117*  BUN 60* 70* 76* 80*  CALCIUM 8.0* 7.6* 7.6* 7.7*  CREATININE 7.04* 7.12* 7.16* 7.25*  GFRNONAA 7* 7* 7* 7*  GFRAA 8* 8* 8* 8*    LIVER FUNCTION TESTS: Recent Labs    04/24/18 1433  BILITOT 0.4  AST 25  ALT 26  ALKPHOS 86  PROT 5.5*  ALBUMIN 2.9*    TUMOR MARKERS: No results for input(s): AFPTM, CEA, CA199, CHROMGRNA in the last 8760 hours.  Assessment and Plan:  CKD4 ESRD Vein mapping and plan for fistula underway To start dialysis today per Dr Joelyn Oms Scheduled for tunneled dialysis catheter placement Risks and benefits discussed with the patient including, but not limited to bleeding, infection, vascular injury, pneumothorax which may require chest tube placement, air embolism or even death  All of the patient's questions were answered, patient is agreeable to proceed. Consent signed and in chart.  Thank you for this interesting consult.  I greatly enjoyed meeting TYHIR SCHWAN and look forward to participating in their care.  A copy of this report was sent to the requesting provider on this date.  Electronically Signed: Lavonia Drafts, PA-C 04/29/2018, 11:13 AM   I spent a total of 20 Minutes    in face to face in clinical consultation,  greater than 50% of which was counseling/coordinating care for tunneled dialysis catheter placement

## 2018-04-29 NOTE — Progress Notes (Signed)
[facility] Progress Note  Patient: Justin [name] EUM:[id number]   PCP: Reynold Bowen, MD DOB: [birth year]   DOA: 04/24/2018   DOS: 04/29/2018   Date of Service: the patient was seen and examined on 04/29/2018  Brief hospital course: Pt. with PMH of hypertension, CKD stage IV and DM 2 ; admitted on 04/24/2018, presented with complaint of shortnes of breath, was found to have acute on chronic systolic CHF. Currently further plan is continue IV diuresis.  Subjective: No acute complaint no acute events.  Assessment and Plan: Acute respiratory distress Acute on chronic systolic CHF Presents with complaints of shortness of breath and lower extremity swelling over the last week.  Patient with 2+ pitting lower extremity edema and crackles on lung exam.  BNP elevated at 1674 .1 and chest x-ray showing interstitial edema.  Patient with no prior history of heart disease. -Strict I&Os and daily weights -Elevate lower extremities -Lasix 40 mg IV Bid - echocardiogram shows decreased EF.  Will require further work-up. Appreciate cardiology assistance, tentatively Wednesday cardiac catheterization  Hypertensive urgency: On admission patient's blood pressure elevated 210/113.  Patient had been given nitroglycerin and lisinopril in the ED.   -Hold lisinopril due to acute kidney injury -Hydralazine IV as needed -Nephrology recommended starting isosorbide -hydralazine 20-37.5 mg 3 times daily  Acute renal failure superimposed on chronic kidney disease stage IV: Previous noted to have creatinine around 3.1, but presents with creatinine 6.76 with BUN 32.  Suspect acute rise could be related with hypoperfusion related with heart failure. -Appreciate nephrology consultative services -Follow-up protein /creatinine ratio -Tolerated first HD treatment, repeat HD tomorrow.  Will also get AV fistula placement as well as TDC placement and probably will be clipped at the time of the discharge.  Appreciate  vascular surgery consultation  Insulin-dependent diabetes mellitus: Patient normally takes Novolin 70/30 insulin 40 units every morning. -Hypoglycemic protocols -Reduced 70/30 insulin to 20 units daily while in hospital -CBGs q. before meals with sensitive sliding scale insulin -Adjust regimen as needed  Hyperlipidemia -Continue Crestor  Diet: Renal diet DVT Prophylaxis: subcutaneous Heparin  Advance goals of care discussion: Full code  Family Communication: no family was present at bedside, at the time of interview. D/W significant other 04/28/2018, after patient's permission.  Patient has a daughter but they are not involved.  Per significant other family includes parents and brother.  Disposition:  Discharge to home.  Consultants: Nephrology and cardiology Procedures: Echocardiogram  Scheduled Meds:  aspirin  81 mg Oral Daily   Chlorhexidine Gluconate Cloth  6 each Topical Q0600   insulin aspart  0-5 Units Subcutaneous QHS   insulin aspart  0-9 Units Subcutaneous TID WC   insulin aspart protamine- aspart  20 Units Subcutaneous Q breakfast   isosorbide-hydrALAZINE  1 tablet Oral TID   Living Better with Heart Failure Book   Does not apply Once   metoprolol succinate  50 mg Oral Daily   rosuvastatin  40 mg Oral Daily   sodium chloride flush  3 mL Intravenous Q12H   Continuous Infusions:  sodium chloride     ferric gluconate (FERRLECIT/NULECIT) IV Stopped (04/29/18 1605)   PRN Meds: sodium chloride, acetaminophen, hydrALAZINE, ondansetron (ZOFRAN) IV, sodium chloride flush, zolpidem Antibiotics: Anti-infectives (From admission, onward)   Start     Dose/Rate Route Frequency Ordered Stop   04/29/18 1000  ceFAZolin (ANCEF) IVPB 2g/100 mL premix     2 g 200 mL/hr over 30 Minutes Intravenous To Radiology 04/29/18 0942 04/29/18 1310  Objective: Physical Exam: Vitals:   04/29/18 1430 04/29/18 1500 04/29/18 1530 04/29/18 1944  BP: 135/64 139/68 (!)  149/68 133/64  Pulse: (!) 52 (!) 51 (!) 51 63  Resp:    17  Temp:    97.8 F (36.6 C)  TempSrc:    Oral  SpO2:    93%  Weight:      Height:        Intake/Output Summary (Last 24 hours) at 04/29/2018 1957 Last data filed at 04/29/2018 0858 Gross per 24 hour  Intake 300 ml  Output 850 ml  Net -550 ml   Filed Weights   04/28/18 0400 04/29/18 0500 04/29/18 1340  Weight: 86.2 kg 80.9 kg 80.4 kg   General: Alert, Awake and Oriented to Time, Place and Person. Appear in mild distress, affect appropriate Eyes: PERRL, Conjunctiva normal ENT: Oral Mucosa clear moist. Neck: no JVD, no Abnormal Mass Or lumps Cardiovascular: S1 and S2 Present, no Murmur, Peripheral Pulses Present Respiratory: normal respiratory effort, Bilateral Air entry equal and Decreased, no use of accessory muscle, Clear to Auscultation, no Crackles, no wheezes Abdomen: Bowel Sound present, Soft and no tenderness, no hernia Skin: no redness, no Rash, no induration Extremities: bilateral  Pedal edema, no calf tenderness Neurologic: Grossly no focal neuro deficit. Bilaterally Equal motor strength  Data Reviewed: CBC: Recent Labs  Lab 04/24/18 1433 04/25/18 0429 04/27/18 0215 04/28/18 0230 04/29/18 0239  WBC 8.4 9.9 10.1 9.6 8.5  NEUTROABS 6.4 7.6  --   --   --   HGB 11.7* 10.5* 10.3* 10.4* 10.1*  HCT 36.8* 32.5* 32.1* 31.7* 31.0*  MCV 88.7 89.0 88.4 86.4 86.8  PLT 230 222 217 219 353   Basic Metabolic Panel: Recent Labs  Lab 04/25/18 0429 04/26/18 0317 04/27/18 0215 04/28/18 0230 04/29/18 0239  NA 145 141 139 140 141  K 3.6 3.6 3.6 3.7 3.7  CL 111 106 103 102 104  CO2 20* 21* 24 24 25   GLUCOSE 81 110* 103* 127* 117*  BUN 56* 60* 70* 76* 80*  CREATININE 6.93* 7.04* 7.12* 7.16* 7.25*  CALCIUM 7.6* 8.0* 7.6* 7.6* 7.7*  MG  --  1.9 2.0 2.1 2.2    Liver Function Tests: Recent Labs  Lab 04/24/18 1433  AST 25  ALT 26  ALKPHOS 86  BILITOT 0.4  PROT 5.5*  ALBUMIN 2.9*   No results for  input(s): LIPASE, AMYLASE in the last 168 hours. No results for input(s): AMMONIA in the last 168 hours. Coagulation Profile: Recent Labs  Lab 04/29/18 0239  INR 1.3*   Cardiac Enzymes: Recent Labs  Lab 04/24/18 1811 04/24/18 2240 04/25/18 0429  TROPONINI 0.05* 0.06* 0.07*   BNP (last 3 results) No results for input(s): PROBNP in the last 8760 hours. CBG: Recent Labs  Lab 04/28/18 1110 04/28/18 1601 04/28/18 2131 04/29/18 0536 04/29/18 1730  GLUCAP 130* 120* 192* 121* 111*   Studies: Ir Fluoro Guide Cv Line Right  Result Date: 04/29/2018 INDICATION: 68 year old with end-stage renal disease and starting dialysis. Patient needs dialysis catheter. EXAM: FLUOROSCOPIC AND ULTRASOUND GUIDED PLACEMENT OF A TUNNELED DIALYSIS CATHETER Physician: Stephan Minister. Anselm Pancoast, MD MEDICATIONS: Ancef 2 g; The antibiotic was administered within an appropriate time interval prior to skin puncture. ANESTHESIA/SEDATION: Versed 1.0 mg IV; Fentanyl 25 mcg IV; Moderate Sedation Time:  26 The patient was continuously monitored during the procedure by the interventional radiology nurse under my direct supervision. FLUOROSCOPY TIME:  Fluoroscopy Time: 36 seconds, 4 mGy COMPLICATIONS: None immediate. PROCEDURE:  The procedure was explained to the patient. The risks and benefits of the procedure were discussed and the patient's questions were addressed. Informed consent was obtained from the patient. The patient was placed supine on the interventional table. Ultrasound confirmed a patent right internal jugular vein. Ultrasound images were obtained for documentation. The right side of the neck and chest was prepped and draped in a sterile fashion. The right neck was anesthetized with 1% lidocaine. Maximal barrier sterile technique was utilized including caps, mask, sterile gowns, sterile gloves, sterile drape, hand hygiene and skin antiseptic. A small incision was made with #11 blade scalpel. A 21 gauge needle directed into the  right internal jugular vein with ultrasound guidance. A micropuncture dilator set was placed. A 23 cm tip to cuff Palindrome catheter was selected. The skin below the right clavicle was anesthetized and a small incision was made with an #11 blade scalpel. A subcutaneous tunnel was formed to the vein dermatotomy site. The catheter was brought through the tunnel. The vein dermatotomy site was dilated to accommodate a peel-away sheath. The catheter was placed through the peel-away sheath and directed into the central venous structures. The tip of the catheter was placed at the SVC/right atrium junction with fluoroscopy. Fluoroscopic images were obtained for documentation. Both lumens were found to aspirate and flush well. The proper amount of heparin was flushed in both lumens. The vein dermatotomy site was closed using a single layer of absorbable suture and Dermabond. Gel-Foam was placed within the subcutaneous tract. The catheter was secured to the skin using Prolene suture. IMPRESSION: Successful placement of a right jugular tunneled dialysis catheter using ultrasound and fluoroscopic guidance. Electronically Signed   By: Markus Daft M.D.   On: 04/29/2018 13:50   Ir US Guide Vasc Access Right  Result Date: 04/29/2018 INDICATION: 68 year old with end-stage renal disease and starting dialysis. Patient needs dialysis catheter. EXAM: FLUOROSCOPIC AND ULTRASOUND GUIDED PLACEMENT OF A TUNNELED DIALYSIS CATHETER Physician: Stephan Minister. Anselm Pancoast, MD MEDICATIONS: Ancef 2 g; The antibiotic was administered within an appropriate time interval prior to skin puncture. ANESTHESIA/SEDATION: Versed 1.0 mg IV; Fentanyl 25 mcg IV; Moderate Sedation Time:  26 The patient was continuously monitored during the procedure by the interventional radiology nurse under my direct supervision. FLUOROSCOPY TIME:  Fluoroscopy Time: 36 seconds, 4 mGy COMPLICATIONS: None immediate. PROCEDURE: The procedure was explained to the patient. The risks and  benefits of the procedure were discussed and the patient's questions were addressed. Informed consent was obtained from the patient. The patient was placed supine on the interventional table. Ultrasound confirmed a patent right internal jugular vein. Ultrasound images were obtained for documentation. The right side of the neck and chest was prepped and draped in a sterile fashion. The right neck was anesthetized with 1% lidocaine. Maximal barrier sterile technique was utilized including caps, mask, sterile gowns, sterile gloves, sterile drape, hand hygiene and skin antiseptic. A small incision was made with #11 blade scalpel. A 21 gauge needle directed into the right internal jugular vein with ultrasound guidance. A micropuncture dilator set was placed. A 23 cm tip to cuff Palindrome catheter was selected. The skin below the right clavicle was anesthetized and a small incision was made with an #11 blade scalpel. A subcutaneous tunnel was formed to the vein dermatotomy site. The catheter was brought through the tunnel. The vein dermatotomy site was dilated to accommodate a peel-away sheath. The catheter was placed through the peel-away sheath and directed into the central venous structures. The tip  of the catheter was placed at the SVC/right atrium junction with fluoroscopy. Fluoroscopic images were obtained for documentation. Both lumens were found to aspirate and flush well. The proper amount of heparin was flushed in both lumens. The vein dermatotomy site was closed using a single layer of absorbable suture and Dermabond. Gel-Foam was placed within the subcutaneous tract. The catheter was secured to the skin using Prolene suture. IMPRESSION: Successful placement of a right jugular tunneled dialysis catheter using ultrasound and fluoroscopic guidance. Electronically Signed   By: Markus Daft M.D.   On: 04/29/2018 13:50     Time spent: 35 minutes  Author: Berle Mull, MD [facility] 04/29/2018 7:57  PM  To reach On-call, see care teams to locate the attending and reach out to them via [email or web address]. If 7PM-7AM, please contact night-coverage If you still have difficulty reaching the attending provider, please page the [facility] (Director on Call) for [facility] on amion for assistance.

## 2018-04-29 NOTE — TOC Initial Note (Signed)
Transition of Care [facility]) - Initial/Assessment Note    Patient Details  Name: Justin [name] MRN: [id number] Date of Birth: November 10, 1950  Transition of [facility]) CM/SW Contact:    Maryclare Labrador, RN Phone Number: 04/29/2018, 10:05 AM  Clinical Narrative:        PTA from home.  Pt will be new HD pt - needs CLIPPING.           Expected Discharge Plan: [place] Barriers to Discharge: Waiting for outpatient dialysis(new to HD will need clipping and perm access)   Patient Goals and CMS Choice        Expected Discharge Plan and Services Expected Discharge Plan: [place]         Expected Discharge Date: 04/26/18                                    Prior Living Arrangements/Services                       Activities of Daily Living Home Assistive Devices/Equipment: None ADL Screening (condition at time of admission) Patient's cognitive ability adequate to safely complete daily activities?: Yes Is the patient deaf or have difficulty hearing?: No Does the patient have difficulty seeing, even when wearing glasses/contacts?: No Does the patient have difficulty concentrating, remembering, or making decisions?: No Patient able to express need for assistance with ADLs?: Yes Does the patient have difficulty dressing or bathing?: No Independently performs ADLs?: Yes (appropriate for developmental age) Does the patient have difficulty walking or climbing stairs?: No Weakness of Legs: Both Weakness of Arms/Hands: None  Permission Sought/Granted                  Emotional Assessment              Admission diagnosis:  ARF (acute renal failure) ([place]) [N17.9] Hypertensive emergency [I16.1] Acute renal failure, unspecified acute renal failure type ([place]) [N17.9] Acute congestive heart failure, unspecified heart failure type ([place]) [I50.9] Patient Active Problem List   Diagnosis Date Noted  . Acute congestive heart  failure ([place]) 04/24/2018  . Acute respiratory distress 04/24/2018  . Hypertensive urgency 04/24/2018  . Acute renal failure superimposed on chronic kidney disease ([place]) 04/24/2018  . Insulin dependent diabetes mellitus ([place]) 04/24/2018  . Hyperlipidemia 04/24/2018   PCP:  Reynold Bowen, MD Pharmacy:   [facility] [address] [place] - [place] [address] [place] [address] Phone: [phone] Fax: [phone]     Social Determinants of Health (SDOH) Interventions    Readmission Risk Interventions No flowsheet data found.

## 2018-04-29 NOTE — Progress Notes (Signed)
Renal Navigator spoke with Dr. Cristal Generous regarding need for new OP HD seat for patient. Renal Navigator unable to speak with patient to initiate process at this time due to patient being in [place] lab. Renal Navigator called unit and asked to be called once patient returns to room.   Alphonzo Cruise Renal Navigator [phone]

## 2018-04-29 NOTE — Progress Notes (Signed)
Progress Note  Patient Name: Justin [name] Date of Encounter: 04/29/2018  Primary Cardiologist: Kirk Ruths, MD  New  Subjective   Denies any chest pain, no significant shortness of breath.  Awaiting dialysis catheter.  Inpatient Medications    Scheduled Meds:  aspirin  81 mg Oral Daily   Chlorhexidine Gluconate Cloth  6 each Topical Q0600   insulin aspart  0-5 Units Subcutaneous QHS   insulin aspart  0-9 Units Subcutaneous TID WC   insulin aspart protamine- aspart  20 Units Subcutaneous Q breakfast   isosorbide-hydrALAZINE  1 tablet Oral TID   Living Better with Heart Failure Book   Does not apply Once   metoprolol succinate  50 mg Oral Daily   rosuvastatin  40 mg Oral Daily   sodium chloride flush  3 mL Intravenous Q12H   Continuous Infusions:  sodium chloride      ceFAZolin (ANCEF) IV     ferric gluconate (FERRLECIT/NULECIT) IV     PRN Meds: sodium chloride, acetaminophen, hydrALAZINE, ondansetron (ZOFRAN) IV, sodium chloride flush, zolpidem   Vital Signs    Vitals:   04/29/18 0400 04/29/18 0500 04/29/18 0734 04/29/18 0858  BP:  (!) 158/75 (!) 142/62   Pulse:  61 67 96  Resp: (!) 25 18  16   Temp:  97.8 F (36.6 C) 98.1 F (36.7 C)   TempSrc:  Oral Oral   SpO2:  98% 96% 96%  Weight:  80.9 kg    Height:        Intake/Output Summary (Last 24 hours) at 04/29/2018 1028 Last data filed at 04/29/2018 0858 Gross per 24 hour  Intake 780 ml  Output 1800 ml  Net -1020 ml   Last 3 Weights 04/29/2018 04/28/2018 04/27/2018  Weight (lbs) 178 lb 5.6 oz 190 lb 0.6 oz 184 lb 8.4 oz  Weight (kg) 80.9 kg 86.2 kg 83.7 kg      Telemetry    Sinus rhythm, T wave inversions noted, no changes- Personally Reviewed  ECG    Normal sinus rhythm- Personally Reviewed  Physical Exam   GEN: Well nourished, well developed, in no acute distress HEENT: normal Neck: no JVD, carotid bruits, or masses Respiratory: Normal work of breathing  GI: nondistended MS: no  deformity or atrophy Skin: warm and dry, no rash Neuro:  Alert and Oriented x 3, Strength and sensation are intact Psych: euthymic mood, full affect    Labs    Chemistry Recent Labs  Lab 04/24/18 1433  04/27/18 0215 04/28/18 0230 04/29/18 0239  NA 143   < > 139 140 141  K 3.7   < > 3.6 3.7 3.7  CL 113*   < > 103 102 104  CO2 21*   < > 24 24 25   GLUCOSE 86   < > 103* 127* 117*  BUN 52*   < > 70* 76* 80*  CREATININE 6.76*   < > 7.12* 7.16* 7.25*  CALCIUM 7.7*   < > 7.6* 7.6* 7.7*  PROT 5.5*  --   --   --   --   ALBUMIN 2.9*  --   --   --   --   AST 25  --   --   --   --   ALT 26  --   --   --   --   ALKPHOS 86  --   --   --   --   BILITOT 0.4  --   --   --   --  GFRNONAA 8*   < > 7* 7* 7*  GFRAA 9*   < > 8* 8* 8*  ANIONGAP 9   < > 12 14 12    < > = values in this interval not displayed.     Hematology Recent Labs  Lab 04/27/18 0215 04/28/18 0230 04/29/18 0239  WBC 10.1 9.6 8.5  RBC 3.63* 3.67* 3.57*  HGB 10.3* 10.4* 10.1*  HCT 32.1* 31.7* 31.0*  MCV 88.4 86.4 86.8  MCH 28.4 28.3 28.3  MCHC 32.1 32.8 32.6  RDW 13.7 13.8 13.7  PLT 217 219 231    Cardiac Enzymes Recent Labs  Lab 04/24/18 1811 04/24/18 2240 04/25/18 0429  TROPONINI 0.05* 0.06* 0.07*    Recent Labs  Lab 04/24/18 1455  TROPIPOC 0.05     BNP Recent Labs  Lab 04/24/18 1434  BNP 1,674.1*     DDimer No results for input(s): DDIMER in the last 168 hours.   Radiology    Vas [place] Upper Ext Vein Mapping (pre-op Avf)  Result Date: 04/28/2018 UPPER EXTREMITY VEIN MAPPING  Indications: Pre-access. Performing Technologist: Abram Sander RVS  Examination Guidelines: A complete evaluation includes B-mode imaging, spectral Doppler, color Doppler, and power Doppler as needed of all accessible portions of each vessel. Bilateral testing is considered an integral part of a complete examination. Limited examinations for reoccurring indications may be performed as noted.  +-----------------+-------------+----------+--------+  Right Cephalic    Diameter (cm) Depth (cm) Findings  +-----------------+-------------+----------+--------+  Shoulder              0.47         1.29              +-----------------+-------------+----------+--------+  Prox upper arm        0.45         0.89              +-----------------+-------------+----------+--------+  Mid upper arm         0.42         0.31              +-----------------+-------------+----------+--------+  Dist upper arm        0.43         0.37              +-----------------+-------------+----------+--------+  Antecubital fossa     0.47         0.30              +-----------------+-------------+----------+--------+  Prox forearm          0.36         0.38              +-----------------+-------------+----------+--------+  Mid forearm           0.30         0.16              +-----------------+-------------+----------+--------+  Dist forearm          0.29         0.21              +-----------------+-------------+----------+--------+  Wrist                 0.31         0.19              +-----------------+-------------+----------+--------+ +-----------------+-------------+----------+--------+  Right Basilic     Diameter (cm) Depth (cm) Findings  +-----------------+-------------+----------+--------+  Prox upper arm  0.62         1.67              +-----------------+-------------+----------+--------+  Mid upper arm         0.52         1.04              +-----------------+-------------+----------+--------+  Dist upper arm        0.46         0.39              +-----------------+-------------+----------+--------+  Antecubital fossa     0.29         0.34              +-----------------+-------------+----------+--------+  Prox forearm          0.22         0.16              +-----------------+-------------+----------+--------+  Mid forearm           0.23         0.15              +-----------------+-------------+----------+--------+   Distal forearm        0.20         0.17              +-----------------+-------------+----------+--------+ +-----------------+-------------+----------+--------+  Left Cephalic     Diameter (cm) Depth (cm) Findings  +-----------------+-------------+----------+--------+  Shoulder              0.49         1.46              +-----------------+-------------+----------+--------+  Prox upper arm        0.56         1.05              +-----------------+-------------+----------+--------+  Mid upper arm         0.48         0.56              +-----------------+-------------+----------+--------+  Dist upper arm        0.50         0.49              +-----------------+-------------+----------+--------+  Antecubital fossa     0.36         0.46              +-----------------+-------------+----------+--------+  Prox forearm          0.42         0.54              +-----------------+-------------+----------+--------+  Mid forearm           0.34         0.26              +-----------------+-------------+----------+--------+  Dist forearm          0.36         0.19              +-----------------+-------------+----------+--------+  Wrist                 0.39         0.83              +-----------------+-------------+----------+--------+ +-----------------+-------------+----------+--------+  Left Basilic      Diameter (cm) Depth (cm) Findings  +-----------------+-------------+----------+--------+  Prox upper arm  0.40         0.63              +-----------------+-------------+----------+--------+  Mid upper arm         0.40         0.58              +-----------------+-------------+----------+--------+  Dist upper arm        0.37         0.63              +-----------------+-------------+----------+--------+  Antecubital fossa     0.37         0.26              +-----------------+-------------+----------+--------+  Prox forearm          0.28         0.22              +-----------------+-------------+----------+--------+  Mid  forearm           0.28         0.21              +-----------------+-------------+----------+--------+  Distal forearm        0.28         0.23              +-----------------+-------------+----------+--------+ *See table(s) above for measurements and observations.  Diagnosing physician: Monica Martinez MD Electronically signed by Monica Martinez MD on 04/28/2018 at 11:57:00 AM.    Final     Cardiac Studies   EF 35 to 40%-moderate aortic stenosis  Patient Profile     68 y.o. male with EF 35 to 40% here with acute combined systolic and diastolic heart failure with cardiomyopathy of unclear etiology  Assessment & Plan    Acute systolic and diastolic heart failure - Cardiac catheterization perhaps Wednesday.  Dr. Adin Hector note reviewed, plan for hemodialysis session #1 today and then a second hemodialysis session tomorrow, Tuesday.  [name] Kitchen  Appreciate nephrology's assistance.  Obtaining HD catheter today.  V VS consult noted for fistula placement. -Toprol.  Heart rate currently 58.  No ACE inhibitor because of chronic kidney disease.  Elevated troponin - Possibly secondary to underlying coronary artery disease as well as heart failure, demand ischemia phenomenon.  Heart catheterization pending.  Diabetes with hypertension. - Per primary team.  Blood pressure continues to be under good control.  No changes made  Aortic stenosis -Moderate on echo- right and left heart catheterization Wednesday would be helpful for evaluation.  No changes.  Essential hypertension - Appreciate nephrology's assistance as well.  Avoiding ACE inhibitor.  BiDil and Toprol.  No changes today.  Stage IV-V chronic kidney disease. -Per nephrology.  HD catheter to be placed today. NPO      For questions or updates, please contact [place] Please consult [email or web address] for contact info under        Signed, Candee Furbish, MD  04/29/2018, 10:28 AM

## 2018-04-30 ENCOUNTER — Inpatient Hospital Stay (HOSPITAL_COMMUNITY): Payer: Medicare HMO | Admitting: Certified Registered"

## 2018-04-30 ENCOUNTER — Encounter (HOSPITAL_COMMUNITY): Payer: Self-pay

## 2018-04-30 ENCOUNTER — Encounter (HOSPITAL_COMMUNITY)
Admission: EM | Disposition: A | Discharge status: Home Health | Payer: Self-pay | Source: Home / Self Care | Attending: Internal Medicine

## 2018-04-30 DIAGNOSIS — N185 Chronic kidney disease, stage 5: Secondary | ICD-10-CM

## 2018-04-30 HISTORY — PX: AV FISTULA PLACEMENT: SHX1204

## 2018-04-30 LAB — GLUCOSE, CAPILLARY
Glucose-Capillary: 117 mg/dL — ABNORMAL HIGH (ref 70–99)
Glucose-Capillary: 110 mg/dL — ABNORMAL HIGH (ref 70–99)
Glucose-Capillary: 62 mg/dL — ABNORMAL LOW (ref 70–99)
Glucose-Capillary: 173 mg/dL — ABNORMAL HIGH (ref 70–99)
Glucose-Capillary: 130 mg/dL — ABNORMAL HIGH (ref 70–99)
Glucose-Capillary: 111 mg/dL — ABNORMAL HIGH (ref 70–99)

## 2018-04-30 LAB — CBC
HCT: 31.2 % — ABNORMAL LOW (ref 39.0–52.0)
Hemoglobin: 10.2 g/dL — ABNORMAL LOW (ref 13.0–17.0)
MCH: 28.3 pg (ref 26.0–34.0)
MCHC: 32.7 g/dL (ref 30.0–36.0)
MCV: 86.7 fL (ref 80.0–100.0)
Platelets: 236 10*3/uL (ref 150–400)
RBC: 3.6 MIL/uL — ABNORMAL LOW (ref 4.22–5.81)
RDW: 13.7 % (ref 11.5–15.5)
WBC: 8.2 10*3/uL (ref 4.0–10.5)
nRBC: 0 % (ref 0.0–0.2)

## 2018-04-30 LAB — UPEP/UIFE/LIGHT CHAINS/TP, 24-HR UR
% BETA, Urine: 12.7 %
ALPHA 1 URINE: 7.7 %
Albumin, U: 62.1 %
Alpha 2, Urine: 7.4 %
Free Kappa Lt Chains,Ur: 86.14 mg/L (ref 0.63–113.79)
Free Kappa/Lambda Ratio: 4.66 (ref 1.03–31.76)
Free Lambda Lt Chains,Ur: 18.49 mg/L — ABNORMAL HIGH (ref 0.47–11.77)
GAMMA GLOBULIN URINE: 10 %
Total Protein, Urine-Ur/day: 6247 mg/24 hr — ABNORMAL HIGH (ref 30–150)
Total Protein, Urine: 189.3 mg/dL
Total Volume: 3300

## 2018-04-30 LAB — PHOSPHORUS: Phosphorus: 4.9 mg/dL — ABNORMAL HIGH (ref 2.5–4.6)

## 2018-04-30 LAB — HEPATITIS B SURFACE ANTIBODY,QUALITATIVE: Hep B S Ab: NONREACTIVE

## 2018-04-30 LAB — PTH, INTACT AND CALCIUM
Calcium, Total (PTH): 7.9 mg/dL — ABNORMAL LOW (ref 8.6–10.2)
PTH: 99 pg/mL — ABNORMAL HIGH (ref 15–65)

## 2018-04-30 LAB — MAGNESIUM: Magnesium: 2.2 mg/dL (ref 1.7–2.4)

## 2018-04-30 LAB — HEPATITIS B CORE ANTIBODY, TOTAL: Hep B Core Total Ab: NEGATIVE

## 2018-04-30 SURGERY — ARTERIOVENOUS (AV) FISTULA CREATION
Anesthesia: Monitor Anesthesia Care | Laterality: Left

## 2018-04-30 MED ORDER — SODIUM CHLORIDE 0.9 % IV SOLN
INTRAVENOUS | Status: DC
  Administered 2018-05-01: 06:00:00 via INTRAVENOUS

## 2018-04-30 MED ORDER — LIDOCAINE 2% (20 MG/ML) 5 ML SYRINGE
INTRAMUSCULAR | Status: AC
  Filled 2018-04-30: qty 10

## 2018-04-30 MED ORDER — DEXTROSE 50 % IV SOLN
INTRAVENOUS | Status: AC
  Filled 2018-04-30: qty 50

## 2018-04-30 MED ORDER — FENTANYL CITRATE (PF) 250 MCG/5ML IJ SOLN
INTRAMUSCULAR | Status: AC
  Filled 2018-04-30: qty 5

## 2018-04-30 MED ORDER — FENTANYL CITRATE (PF) 250 MCG/5ML IJ SOLN
INTRAMUSCULAR | Status: DC | PRN
  Administered 2018-04-30: 50 ug via INTRAVENOUS
  Administered 2018-04-30 (×2): 25 ug via INTRAVENOUS
  Administered 2018-04-30: 50 ug via INTRAVENOUS

## 2018-04-30 MED ORDER — FENTANYL CITRATE (PF) 100 MCG/2ML IJ SOLN: 25.0000 ug | INTRAMUSCULAR | Status: DC | PRN

## 2018-04-30 MED ORDER — HEPARIN SODIUM (PORCINE) 1000 UNIT/ML IJ SOLN
INTRAMUSCULAR | Status: DC | PRN
  Administered 2018-04-30: 5000 [IU] via INTRAVENOUS

## 2018-04-30 MED ORDER — SODIUM CHLORIDE 0.9 % IV SOLN
INTRAVENOUS | Status: AC
  Filled 2018-04-30: qty 1.2

## 2018-04-30 MED ORDER — LIDOCAINE HCL (PF) 1 % IJ SOLN
INTRAMUSCULAR | Status: AC
  Filled 2018-04-30: qty 30

## 2018-04-30 MED ORDER — PROPOFOL 500 MG/50ML IV EMUL
INTRAVENOUS | Status: DC | PRN
  Administered 2018-04-30: 70 ug/kg/min via INTRAVENOUS

## 2018-04-30 MED ORDER — LIDOCAINE HCL (PF) 1 % IJ SOLN
INTRAMUSCULAR | Status: DC | PRN
  Administered 2018-04-30: 9 mL

## 2018-04-30 MED ORDER — MIDAZOLAM HCL 2 MG/2ML IJ SOLN
INTRAMUSCULAR | Status: AC
  Filled 2018-04-30: qty 2

## 2018-04-30 MED ORDER — ONDANSETRON HCL 4 MG/2ML IJ SOLN
INTRAMUSCULAR | Status: AC
  Filled 2018-04-30: qty 4

## 2018-04-30 MED ORDER — GLYCOPYRROLATE PF 0.2 MG/ML IJ SOSY
PREFILLED_SYRINGE | INTRAMUSCULAR | Status: AC
  Filled 2018-04-30: qty 1

## 2018-04-30 MED ORDER — DARBEPOETIN ALFA 25 MCG/0.42ML IJ SOSY
25.0000 ug | PREFILLED_SYRINGE | INTRAMUSCULAR | Status: DC
  Filled 2018-04-30: qty 0.42

## 2018-04-30 MED ORDER — DEXTROSE 50 % IV SOLN
12.5000 g | INTRAVENOUS | Status: AC
  Administered 2018-04-30: 12.5 g via INTRAVENOUS

## 2018-04-30 MED ORDER — PROMETHAZINE HCL 25 MG/ML IJ SOLN: 6.2500 mg | INTRAMUSCULAR | Status: DC | PRN

## 2018-04-30 MED ORDER — SUCCINYLCHOLINE CHLORIDE 200 MG/10ML IV SOSY
PREFILLED_SYRINGE | INTRAVENOUS | Status: AC
  Filled 2018-04-30: qty 10

## 2018-04-30 MED ORDER — CEFAZOLIN SODIUM-DEXTROSE 2-4 GM/100ML-% IV SOLN
2.0000 g | Freq: Once | INTRAVENOUS | Status: AC
  Administered 2018-04-30: 2 g via INTRAVENOUS

## 2018-04-30 MED ORDER — MIDAZOLAM HCL 2 MG/2ML IJ SOLN
INTRAMUSCULAR | Status: DC | PRN
  Administered 2018-04-30: 1 mg via INTRAVENOUS

## 2018-04-30 MED ORDER — SODIUM CHLORIDE 0.9 % IV SOLN: 250.0000 mL | INTRAVENOUS | Status: DC | PRN

## 2018-04-30 MED ORDER — LIDOCAINE 2% (20 MG/ML) 5 ML SYRINGE
INTRAMUSCULAR | Status: DC | PRN
  Administered 2018-04-30: 40 mg via INTRAVENOUS

## 2018-04-30 MED ORDER — SODIUM CHLORIDE 0.9% FLUSH: 3.0000 mL | INTRAVENOUS | Status: DC | PRN

## 2018-04-30 MED ORDER — SODIUM CHLORIDE 0.9% FLUSH: 3.0000 mL | Freq: Two times a day (BID) | INTRAVENOUS | Status: DC

## 2018-04-30 MED ORDER — SODIUM CHLORIDE 0.9 % IV SOLN
125.0000 mg | INTRAVENOUS | Status: DC
  Administered 2018-05-01: 125 mg via INTRAVENOUS
  Filled 2018-04-30 (×3): qty 10

## 2018-04-30 MED ORDER — CEFAZOLIN SODIUM 1 G IJ SOLR
INTRAMUSCULAR | Status: AC
  Filled 2018-04-30: qty 20

## 2018-04-30 MED ORDER — 0.9 % SODIUM CHLORIDE (POUR BTL) OPTIME
TOPICAL | Status: DC | PRN
  Administered 2018-04-30: 1000 mL

## 2018-04-30 MED ORDER — SODIUM CHLORIDE 0.9 % IV SOLN
INTRAVENOUS | Status: DC | PRN
  Administered 2018-04-30: 50 ug/min via INTRAVENOUS

## 2018-04-30 MED ORDER — ASPIRIN 81 MG PO CHEW
81.0000 mg | CHEWABLE_TABLET | ORAL | Status: AC
  Administered 2018-05-01: 81 mg via ORAL
  Filled 2018-04-30: qty 1

## 2018-04-30 MED ORDER — CEFAZOLIN SODIUM-DEXTROSE 2-4 GM/100ML-% IV SOLN
INTRAVENOUS | Status: AC
  Filled 2018-04-30: qty 100

## 2018-04-30 MED ORDER — PHENYLEPHRINE 40 MCG/ML (10ML) SYRINGE FOR IV PUSH (FOR BLOOD PRESSURE SUPPORT)
PREFILLED_SYRINGE | INTRAVENOUS | Status: AC
  Filled 2018-04-30: qty 10

## 2018-04-30 MED ORDER — SODIUM CHLORIDE 0.9 % IV SOLN
INTRAVENOUS | Status: DC | PRN
  Administered 2018-04-30: 500 mL

## 2018-04-30 SURGICAL SUPPLY — 32 items
ADH SKN CLS APL DERMABOND .7 (GAUZE/BANDAGES/DRESSINGS) ×1
ARMBAND PINK RESTRICT EXTREMIT (MISCELLANEOUS) ×3 IMPLANT
CANISTER SUCT 3000ML PPV (MISCELLANEOUS) ×2 IMPLANT
CANNULA VESSEL 3MM 2 BLNT TIP (CANNULA) ×2 IMPLANT
CLIP VESOCCLUDE MED 6/CT (CLIP) ×2 IMPLANT
CLIP VESOCCLUDE SM WIDE 6/CT (CLIP) ×2 IMPLANT
COVER PROBE W GEL 5X96 (DRAPES) IMPLANT
COVER WAND RF STERILE (DRAPES) ×1 IMPLANT
DECANTER SPIKE VIAL GLASS SM (MISCELLANEOUS) ×2 IMPLANT
DERMABOND ADVANCED (GAUZE/BANDAGES/DRESSINGS) ×1
DERMABOND ADVANCED .7 DNX12 (GAUZE/BANDAGES/DRESSINGS) ×1 IMPLANT
DRAIN PENROSE 1/4X12 LTX STRL (WOUND CARE) ×2 IMPLANT
ELECT REM PT RETURN 9FT ADLT (ELECTROSURGICAL) ×2
ELECTRODE REM PT RTRN 9FT ADLT (ELECTROSURGICAL) ×1 IMPLANT
GLOVE BIO SURGEON STRL SZ7.5 (GLOVE) ×2 IMPLANT
GOWN STRL REUS W/ TWL LRG LVL3 (GOWN DISPOSABLE) ×3 IMPLANT
GOWN STRL REUS W/TWL LRG LVL3 (GOWN DISPOSABLE) ×6
KIT BASIN OR (CUSTOM PROCEDURE TRAY) ×2 IMPLANT
KIT TURNOVER KIT B (KITS) ×2 IMPLANT
LOOP VESSEL MINI RED (MISCELLANEOUS) ×1 IMPLANT
NS IRRIG 1000ML POUR BTL (IV SOLUTION) ×2 IMPLANT
PACK CV ACCESS (CUSTOM PROCEDURE TRAY) ×2 IMPLANT
PAD ARMBOARD 7.5X6 YLW CONV (MISCELLANEOUS) ×4 IMPLANT
SPONGE SURGIFOAM ABS GEL 100 (HEMOSTASIS) IMPLANT
SUT PROLENE 6 0 BV (SUTURE) ×2 IMPLANT
SUT PROLENE 7 0 BV 1 (SUTURE) ×4 IMPLANT
SUT VIC AB 3-0 SH 27 (SUTURE) ×2
SUT VIC AB 3-0 SH 27X BRD (SUTURE) ×1 IMPLANT
SUT VICRYL 4-0 PS2 18IN ABS (SUTURE) ×2 IMPLANT
TOWEL GREEN STERILE (TOWEL DISPOSABLE) ×2 IMPLANT
UNDERPAD 30X30 (UNDERPADS AND DIAPERS) ×2 IMPLANT
WATER STERILE IRR 1000ML POUR (IV SOLUTION) ×2 IMPLANT

## 2018-04-30 NOTE — Progress Notes (Signed)
Provided report to dialysis regarding patient. Dialysis reported that patient will be taken up in approximately in 3 hours.  Dialysis updated about patient's upcoming heart cath procedure. Dialysis RN had no further questions.

## 2018-04-30 NOTE — Interval H&P Note (Signed)
History and Physical Interval Note:  04/30/2018 3:18 PM  Justin [name]  has presented today for surgery, with the diagnosis of END STAGE RENAL DISEASE FOR HEMODIALYSIS ACCESS.  The various methods of treatment have been discussed with the patient and family. After consideration of risks, benefits and other options for treatment, the patient has consented to  Procedure(s): CREATION OF RADIOCEPHALIC ARTERIOVENOUS FISTULA LEFT ARM (Left) as a surgical intervention.  The patient's history has been reviewed, patient examined, no change in status, stable for surgery.  I have reviewed the patient's chart and labs.  Questions were answered to the patient's satisfaction.     Ruta Hinds

## 2018-04-30 NOTE — Transfer of Care (Signed)
Immediate Anesthesia Transfer of Care Note  Patient: Justin [name]  Procedure(s) Performed: CREATION OF RADIOCEPHALIC ARTERIOVENOUS FISTULA LEFT ARM (Left )  Patient Location: PACU  Anesthesia Type:MAC  Level of Consciousness: drowsy  Airway & Oxygen Therapy: Patient Spontanous Breathing and Patient connected to face mask oxygen  Post-op Assessment: Report given to RN and Post -op Vital signs reviewed and stable  Post vital signs: Reviewed and stable  Last Vitals:  Vitals Value Taken Time  BP 97/50 04/30/2018  5:15 PM  Temp    Pulse 58 04/30/2018  5:17 PM  Resp 19 04/30/2018  5:17 PM  SpO2 99 % 04/30/2018  5:17 PM  Vitals shown include unvalidated device data.  Last Pain:  Vitals:   04/30/18 1430  TempSrc: Oral  PainSc:       Patients Stated Pain Goal: 2 (02/77/41 2878)  Complications: No apparent anesthesia complications

## 2018-04-30 NOTE — Op Note (Signed)
Procedure: Left Radial Cephalic AV fistula   Preop: ESRD   Postop: ESRD   Anesthesia: MAC with local   Assistant: Arlee Muslim, PA-c   Findings: 2.5 mm cephalic vein   2.5 mm radial artery   Procedure Details:  The left upper extremity was prepped and draped in usual sterile fashion. Local anesthesia was infiltrated midway between the cephalic and radial artery anatomically. A longitudinal skin incision was then made in this location at the distal left forearm. The incision was carried into the subcutaneous tissues down to level cephalic vein. The vein had some spasm but was overall reasonable quality about 2.5 mm but accepting a 3.5 mm dilator. The vein was dissected free circumferentially and small side branches ligated and divided between silk ties.  This was gently distended with heparinized saline, spatulated, and marked for orientation. Next the radial artery was dissected free in the medial portion incision. The artery was 2.5 mm in diameter but had a reasonable pulse. The vessel loops were placed proximal and distal to the planned site of arteriotomy. The patient was given 5000 units of intravenous heparin. After appropriate circulation time, the vessel loops were used to control the artery. A longitudinal opening was made in the left radial artery. The vein was controlled proximally with a fine bulldog clamp. The vein was then swung over to the artery and sewn end of vein to side of artery using a running 7-0 Prolene suture. Just prior to completion, the anastomosis was fore bled back bled and thoroughly flushed. The anastomosis was secured, vessel loops released, and there was a palpable thrill in the fistula immediately. After hemostasis was obtained, the subcutaneous tissues were reapproximated using a running 3-0 Vicryl suture. The skin was then closed with a 4 0 Vicryl subcuticular stitch. Dermabond was applied to the skin incision. The patient tolerated the procedure well and there were  no complications. Instrument sponge and needle count were correct at the end of the case. The patient was taken to PACU in stable condition.   Ruta Hinds, MD  [facility]  Office: [phone]  Pager: [phone]

## 2018-04-30 NOTE — Progress Notes (Signed)
Patient with CBG of 62 prior to going to OR, 12.5 mg of dextrose given prior to leaving the floor.  Patient via bed to short stay for procedure

## 2018-04-30 NOTE — Progress Notes (Signed)
Informed by RN during hand off report ,that pt CBG was 62. 1/2 amp of D50 given by the RN. Repeat CBG =173.

## 2018-04-30 NOTE — H&P (View-Only) (Signed)
Progress Note  Patient Name: Justin [name] Date of Encounter: 04/30/2018  Primary Cardiologist: Kirk Ruths, MD  New  Subjective   Doing well, dialysis session went well.  Going for session #2 today.  No chest pain.  He is frustrated that he is n.p.o.  We talked.  Inpatient Medications    Scheduled Meds:  aspirin  81 mg Oral Daily   Chlorhexidine Gluconate Cloth  6 each Topical Q0600   darbepoetin (ARANESP) injection - DIALYSIS  25 mcg Intravenous Q Tue-HD   insulin aspart  0-5 Units Subcutaneous QHS   insulin aspart  0-9 Units Subcutaneous TID WC   insulin aspart protamine- aspart  20 Units Subcutaneous Q breakfast   isosorbide-hydrALAZINE  1 tablet Oral TID   Living Better with Heart Failure Book   Does not apply Once   metoprolol succinate  50 mg Oral Daily   rosuvastatin  40 mg Oral Daily   sodium chloride flush  3 mL Intravenous Q12H   Continuous Infusions:  sodium chloride     ferric gluconate (FERRLECIT/NULECIT) IV     PRN Meds: sodium chloride, acetaminophen, hydrALAZINE, ondansetron (ZOFRAN) IV, sodium chloride flush, zolpidem   Vital Signs    Vitals:   04/29/18 1944 04/30/18 0002 04/30/18 0407 04/30/18 0739  BP: 133/64 (!) 126/57 (!) 151/66 (!) 159/67  Pulse: 63 (!) 59 (!) 59   Resp: 17 19 11    Temp: 97.8 F (36.6 C) 98.3 F (36.8 C) 98.1 F (36.7 C) 97.7 F (36.5 C)  TempSrc: Oral Oral Oral Oral  SpO2: 93% 99% 98%   Weight:   80.1 kg   Height:       No intake or output data in the 24 hours ending 04/30/18 0923 Last 3 Weights 04/30/2018 04/29/2018 04/29/2018  Weight (lbs) 176 lb 9.4 oz 177 lb 4 oz 178 lb 5.6 oz  Weight (kg) 80.1 kg 80.4 kg 80.9 kg      Telemetry    Sinus rhythm, T wave inversions noted, no changes today- Personally Reviewed  ECG    Normal sinus rhythm- Personally Reviewed  Physical Exam   GEN: Well nourished, well developed, in no acute distress  HEENT: normal  Neck: no JVD, carotid bruits, or  masses Cardiac: RRR; no murmurs, rubs, or gallops,no edema, dialysis catheter chest wall Respiratory:  clear to auscultation bilaterally, normal work of breathing GI: soft, nontender, nondistended, + BS MS: no deformity or atrophy  Skin: warm and dry, no rash Neuro:  Alert and Oriented x 3, Strength and sensation are intact Psych: euthymic mood, full affect      Labs    Chemistry Recent Labs  Lab 04/24/18 1433  04/27/18 0215 04/28/18 0230 04/29/18 0239 04/29/18 1350  NA 143   < > 139 140 141  --   K 3.7   < > 3.6 3.7 3.7  --   CL 113*   < > 103 102 104  --   CO2 21*   < > 24 24 25   --   GLUCOSE 86   < > 103* 127* 117*  --   BUN 52*   < > 70* 76* 80*  --   CREATININE 6.76*   < > 7.12* 7.16* 7.25*  --   CALCIUM 7.7*   < > 7.6* 7.6* 7.7* 7.9*  PROT 5.5*  --   --   --   --   --   ALBUMIN 2.9*  --   --   --   --   --  AST 25  --   --   --   --   --   ALT 26  --   --   --   --   --   ALKPHOS 86  --   --   --   --   --   BILITOT 0.4  --   --   --   --   --   GFRNONAA 8*   < > 7* 7* 7*  --   GFRAA 9*   < > 8* 8* 8*  --   ANIONGAP 9   < > 12 14 12   --    < > = values in this interval not displayed.     Hematology Recent Labs  Lab 04/28/18 0230 04/29/18 0239 04/30/18 0234  WBC 9.6 8.5 8.2  RBC 3.67* 3.57* 3.60*  HGB 10.4* 10.1* 10.2*  HCT 31.7* 31.0* 31.2*  MCV 86.4 86.8 86.7  MCH 28.3 28.3 28.3  MCHC 32.8 32.6 32.7  RDW 13.8 13.7 13.7  PLT 219 231 236    Cardiac Enzymes Recent Labs  Lab 04/24/18 1811 04/24/18 2240 04/25/18 0429  TROPONINI 0.05* 0.06* 0.07*    Recent Labs  Lab 04/24/18 1455  TROPIPOC 0.05     BNP Recent Labs  Lab 04/24/18 1434  BNP 1,674.1*     DDimer No results for input(s): DDIMER in the last 168 hours.   Radiology    Ir Cyndy Freeze Guide Cv Line Right  Result Date: 04/29/2018 INDICATION: 68 year old with end-stage renal disease and starting dialysis. Patient needs dialysis catheter. EXAM: FLUOROSCOPIC AND ULTRASOUND GUIDED  PLACEMENT OF A TUNNELED DIALYSIS CATHETER Physician: Stephan Minister. Anselm Pancoast, MD MEDICATIONS: Ancef 2 g; The antibiotic was administered within an appropriate time interval prior to skin puncture. ANESTHESIA/SEDATION: Versed 1.0 mg IV; Fentanyl 25 mcg IV; Moderate Sedation Time:  26 The patient was continuously monitored during the procedure by the interventional radiology nurse under my direct supervision. FLUOROSCOPY TIME:  Fluoroscopy Time: 36 seconds, 4 mGy COMPLICATIONS: None immediate. PROCEDURE: The procedure was explained to the patient. The risks and benefits of the procedure were discussed and the patient's questions were addressed. Informed consent was obtained from the patient. The patient was placed supine on the interventional table. Ultrasound confirmed a patent right internal jugular vein. Ultrasound images were obtained for documentation. The right side of the neck and chest was prepped and draped in a sterile fashion. The right neck was anesthetized with 1% lidocaine. Maximal barrier sterile technique was utilized including caps, mask, sterile gowns, sterile gloves, sterile drape, hand hygiene and skin antiseptic. A small incision was made with #11 blade scalpel. A 21 gauge needle directed into the right internal jugular vein with ultrasound guidance. A micropuncture dilator set was placed. A 23 cm tip to cuff Palindrome catheter was selected. The skin below the right clavicle was anesthetized and a small incision was made with an #11 blade scalpel. A subcutaneous tunnel was formed to the vein dermatotomy site. The catheter was brought through the tunnel. The vein dermatotomy site was dilated to accommodate a peel-away sheath. The catheter was placed through the peel-away sheath and directed into the central venous structures. The tip of the catheter was placed at the SVC/right atrium junction with fluoroscopy. Fluoroscopic images were obtained for documentation. Both lumens were found to aspirate and flush  well. The proper amount of heparin was flushed in both lumens. The vein dermatotomy site was closed using a single layer of absorbable suture and  Dermabond. Gel-Foam was placed within the subcutaneous tract. The catheter was secured to the skin using Prolene suture. IMPRESSION: Successful placement of a right jugular tunneled dialysis catheter using ultrasound and fluoroscopic guidance. Electronically Signed   By: Markus Daft M.D.   On: 04/29/2018 13:50   Ir US Guide Vasc Access Right  Result Date: 04/29/2018 INDICATION: 68 year old with end-stage renal disease and starting dialysis. Patient needs dialysis catheter. EXAM: FLUOROSCOPIC AND ULTRASOUND GUIDED PLACEMENT OF A TUNNELED DIALYSIS CATHETER Physician: Stephan Minister. Anselm Pancoast, MD MEDICATIONS: Ancef 2 g; The antibiotic was administered within an appropriate time interval prior to skin puncture. ANESTHESIA/SEDATION: Versed 1.0 mg IV; Fentanyl 25 mcg IV; Moderate Sedation Time:  26 The patient was continuously monitored during the procedure by the interventional radiology nurse under my direct supervision. FLUOROSCOPY TIME:  Fluoroscopy Time: 36 seconds, 4 mGy COMPLICATIONS: None immediate. PROCEDURE: The procedure was explained to the patient. The risks and benefits of the procedure were discussed and the patient's questions were addressed. Informed consent was obtained from the patient. The patient was placed supine on the interventional table. Ultrasound confirmed a patent right internal jugular vein. Ultrasound images were obtained for documentation. The right side of the neck and chest was prepped and draped in a sterile fashion. The right neck was anesthetized with 1% lidocaine. Maximal barrier sterile technique was utilized including caps, mask, sterile gowns, sterile gloves, sterile drape, hand hygiene and skin antiseptic. A small incision was made with #11 blade scalpel. A 21 gauge needle directed into the right internal jugular vein with ultrasound guidance. A  micropuncture dilator set was placed. A 23 cm tip to cuff Palindrome catheter was selected. The skin below the right clavicle was anesthetized and a small incision was made with an #11 blade scalpel. A subcutaneous tunnel was formed to the vein dermatotomy site. The catheter was brought through the tunnel. The vein dermatotomy site was dilated to accommodate a peel-away sheath. The catheter was placed through the peel-away sheath and directed into the central venous structures. The tip of the catheter was placed at the SVC/right atrium junction with fluoroscopy. Fluoroscopic images were obtained for documentation. Both lumens were found to aspirate and flush well. The proper amount of heparin was flushed in both lumens. The vein dermatotomy site was closed using a single layer of absorbable suture and Dermabond. Gel-Foam was placed within the subcutaneous tract. The catheter was secured to the skin using Prolene suture. IMPRESSION: Successful placement of a right jugular tunneled dialysis catheter using ultrasound and fluoroscopic guidance. Electronically Signed   By: Markus Daft M.D.   On: 04/29/2018 13:50   Vas [place] Upper Ext Vein Mapping (pre-op Avf)  Result Date: 04/28/2018 UPPER EXTREMITY VEIN MAPPING  Indications: Pre-access. Performing Technologist: Abram Sander RVS  Examination Guidelines: A complete evaluation includes B-mode imaging, spectral Doppler, color Doppler, and power Doppler as needed of all accessible portions of each vessel. Bilateral testing is considered an integral part of a complete examination. Limited examinations for reoccurring indications may be performed as noted. +-----------------+-------------+----------+--------+  Right Cephalic    Diameter (cm) Depth (cm) Findings  +-----------------+-------------+----------+--------+  Shoulder              0.47         1.29              +-----------------+-------------+----------+--------+  Prox upper arm        0.45         0.89               +-----------------+-------------+----------+--------+  Mid upper arm         0.42         0.31              +-----------------+-------------+----------+--------+  Dist upper arm        0.43         0.37              +-----------------+-------------+----------+--------+  Antecubital fossa     0.47         0.30              +-----------------+-------------+----------+--------+  Prox forearm          0.36         0.38              +-----------------+-------------+----------+--------+  Mid forearm           0.30         0.16              +-----------------+-------------+----------+--------+  Dist forearm          0.29         0.21              +-----------------+-------------+----------+--------+  Wrist                 0.31         0.19              +-----------------+-------------+----------+--------+ +-----------------+-------------+----------+--------+  Right Basilic     Diameter (cm) Depth (cm) Findings  +-----------------+-------------+----------+--------+  Prox upper arm        0.62         1.67              +-----------------+-------------+----------+--------+  Mid upper arm         0.52         1.04              +-----------------+-------------+----------+--------+  Dist upper arm        0.46         0.39              +-----------------+-------------+----------+--------+  Antecubital fossa     0.29         0.34              +-----------------+-------------+----------+--------+  Prox forearm          0.22         0.16              +-----------------+-------------+----------+--------+  Mid forearm           0.23         0.15              +-----------------+-------------+----------+--------+  Distal forearm        0.20         0.17              +-----------------+-------------+----------+--------+ +-----------------+-------------+----------+--------+  Left Cephalic     Diameter (cm) Depth (cm) Findings  +-----------------+-------------+----------+--------+  Shoulder              0.49         1.46               +-----------------+-------------+----------+--------+  Prox upper arm        0.56         1.05              +-----------------+-------------+----------+--------+  Mid upper arm         0.48         0.56              +-----------------+-------------+----------+--------+  Dist upper arm        0.50         0.49              +-----------------+-------------+----------+--------+  Antecubital fossa     0.36         0.46              +-----------------+-------------+----------+--------+  Prox forearm          0.42         0.54              +-----------------+-------------+----------+--------+  Mid forearm           0.34         0.26              +-----------------+-------------+----------+--------+  Dist forearm          0.36         0.19              +-----------------+-------------+----------+--------+  Wrist                 0.39         0.83              +-----------------+-------------+----------+--------+ +-----------------+-------------+----------+--------+  Left Basilic      Diameter (cm) Depth (cm) Findings  +-----------------+-------------+----------+--------+  Prox upper arm        0.40         0.63              +-----------------+-------------+----------+--------+  Mid upper arm         0.40         0.58              +-----------------+-------------+----------+--------+  Dist upper arm        0.37         0.63              +-----------------+-------------+----------+--------+  Antecubital fossa     0.37         0.26              +-----------------+-------------+----------+--------+  Prox forearm          0.28         0.22              +-----------------+-------------+----------+--------+  Mid forearm           0.28         0.21              +-----------------+-------------+----------+--------+  Distal forearm        0.28         0.23              +-----------------+-------------+----------+--------+ *See table(s) above for measurements and observations.  Diagnosing physician: Monica Martinez MD Electronically  signed by Monica Martinez MD on 04/28/2018 at 11:57:00 AM.    Final     Cardiac Studies   EF 35 to 40%-moderate aortic stenosis  Patient Profile     68 y.o. male with EF 35 to 40% here with acute combined systolic and diastolic heart failure with cardiomyopathy of unclear etiology  Assessment & Plan    Acute systolic and diastolic heart failure - Cardiac catheterization  tomorrow Wednesday.  AV fistula placement today.   -Toprol.  Heart rate currently upper 50s.  No ACE inhibitor because of chronic kidney disease.  Elevated troponin - Possibly secondary to underlying coronary artery disease as well as heart failure, demand ischemia phenomenon.  Heart catheterization pending tomorrow.  Discussed with him risk versus benefits stroke heart attack death bleeding.  Diabetes with hypertension. - Per primary team.  Blood pressure continues to be under good control.  No changes  Aortic stenosis -Moderate on echo- right and left heart catheterization Wednesday would be helpful for evaluation.  No changes.  Essential hypertension - Appreciate nephrology's assistance as well.  Avoiding ACE inhibitor.  BiDil and Toprol.  No changes again.  Improved.  With hemodialysis.  Stage IV-V chronic kidney disease. -Per nephrology.  Tolerating hemodialysis well.  AV fistula today.      For questions or updates, please contact [facility] Please consult [email or web address] for contact info under        Signed, Candee Furbish, MD  04/30/2018, 9:23 AM

## 2018-04-30 NOTE — Progress Notes (Signed)
Patient has been accepted at [facility] on TTS schedule with 11:30am seat time for OP HD. He will need to arrive at 10:45am on his first treatment day to sign paperwork. If his first treatment falls on a Saturday, he will need to report to the OP HD clinic on the Friday prior to sign paperwork. Renal Navigator has notified Dr. Cristal Generous and will follow patient through until discharge to assist with smooth transition from hospital to OP HD clinic start.  Alphonzo Cruise Renal Navigator [phone]

## 2018-04-30 NOTE — Anesthesia Postprocedure Evaluation (Signed)
Anesthesia Post Note  Patient: Justin [name]  Procedure(s) Performed: CREATION OF RADIOCEPHALIC ARTERIOVENOUS FISTULA LEFT ARM (Left )     Patient location during evaluation: PACU Anesthesia Type: MAC Level of consciousness: awake and alert Pain management: pain level controlled Vital Signs Assessment: post-procedure vital signs reviewed and stable Respiratory status: spontaneous breathing, nonlabored ventilation, respiratory function stable and patient connected to nasal cannula oxygen Cardiovascular status: stable and blood pressure returned to baseline Postop Assessment: no apparent nausea or vomiting Anesthetic complications: no    Last Vitals:  Vitals:   04/30/18 1730 04/30/18 1744  BP: (!) 99/53 134/61  Pulse: 62 60  Resp: 20 18  Temp:    SpO2: 100% 93%    Last Pain:  Vitals:   04/30/18 1744  TempSrc:   PainSc: 0-No pain                 Eain Mullendore S

## 2018-04-30 NOTE — Discharge Instructions (Signed)
° °  [facility] ° °Discharge Instructions ° °AV Fistula or Graft Surgery for Dialysis Access ° °Please refer to the following instructions for your post-procedure care. Your surgeon or physician assistant will discuss any changes with you. ° °Activity ° °You may drive the day following your surgery, if you are comfortable and no longer taking prescription pain medication. Resume full activity as the soreness in your incision resolves. ° °Bathing/Showering ° °You may shower after you go home. Keep your incision dry for 48 hours. Do not soak in a bathtub, hot tub, or swim until the incision heals completely. You may not shower if you have a hemodialysis catheter. ° °Incision Care ° °Clean your incision with mild soap and water after 48 hours. Pat the area dry with a clean towel. You do not need a bandage unless otherwise instructed. Do not apply any ointments or creams to your incision. You may have skin glue on your incision. Do not peel it off. It will come off on its own in about one week. Your arm may swell a bit after surgery. To reduce swelling use pillows to elevate your arm so it is above your heart. Your doctor will tell you if you need to lightly wrap your arm with an ACE bandage. ° °Diet ° °Resume your normal diet. There are not special food restrictions following this procedure. In order to heal from your surgery, it is CRITICAL to get adequate nutrition. Your body requires vitamins, minerals, and protein. Vegetables are the best source of vitamins and minerals. Vegetables also provide the perfect balance of protein. Processed food has little nutritional value, so try to avoid this. ° °Medications ° °Resume taking all of your medications. If your incision is causing pain, you may take over-the counter pain relievers such as acetaminophen (Tylenol). If you were prescribed a stronger pain medication, please be aware these medications can cause nausea and constipation. Prevent  nausea by taking the medication with a snack or meal. Avoid constipation by drinking plenty of fluids and eating foods with high amount of fiber, such as fruits, vegetables, and grains. Do not take Tylenol if you are taking prescription pain medications. ° ° ° ° °Follow up °Your surgeon may want to see you in the office following your access surgery. If so, this will be arranged at the time of your surgery. ° °Please call us immediately for any of the following conditions: ° °Increased pain, redness, drainage (pus) from your incision site °Fever of 101 degrees or higher °Severe or worsening pain at your incision site °Hand pain or numbness. ° °Reduce your risk of vascular disease: ° °Stop smoking. If you would like help, call [facility] at 1-800-QUIT-NOW (1-800-784-8669) or [facility] at 336-586-4000 ° °Manage your cholesterol °Maintain a desired weight °Control your diabetes °Keep your blood pressure down ° °Dialysis ° °It will take several weeks to several months for your new dialysis access to be ready for use. Your surgeon will determine when it is OK to use it. Your nephrologist will continue to direct your dialysis. You can continue to use your Permcath until your new access is ready for use. ° °If you have any questions, please call the office at [phone]. ° °

## 2018-04-30 NOTE — Progress Notes (Signed)
Admit: 04/24/2018 LOS: 6  80M new ESRD, prev CKD4  Subjective:  . ?s about why needs TDC and AVF, addressed . No other issues . HD#1 yesterday after TDC uneventful . CLIP in process . VVS following for AVF; planned for today  04/27 0701 - 04/28 0700 In: 100 [P.O.:100] Out: -   Filed Weights   04/29/18 0500 04/29/18 1340 04/30/18 0407  Weight: 80.9 kg 80.4 kg 80.1 kg    Scheduled Meds: . aspirin  81 mg Oral Daily  . Chlorhexidine Gluconate Cloth  6 each Topical Q0600  . insulin aspart  0-5 Units Subcutaneous QHS  . insulin aspart  0-9 Units Subcutaneous TID WC  . insulin aspart protamine- aspart  20 Units Subcutaneous Q breakfast  . isosorbide-hydrALAZINE  1 tablet Oral TID  . Living Better with Heart Failure Book   Does not apply Once  . metoprolol succinate  50 mg Oral Daily  . rosuvastatin  40 mg Oral Daily  . sodium chloride flush  3 mL Intravenous Q12H   Continuous Infusions: . sodium chloride    . ferric gluconate (FERRLECIT/NULECIT) IV Stopped (04/29/18 1605)   PRN Meds:.sodium chloride, acetaminophen, hydrALAZINE, ondansetron (ZOFRAN) IV, sodium chloride flush, zolpidem  Current Labs: reviewed  Results for Justin, [name] (MRN [id number]) as of 04/30/2018 08:42  Ref. Range 04/29/2018 13:50  PTH, Intact Latest Ref Range: 15 - 65 pg/mL 99 (H)    Physical Exam:  Blood pressure (!) 159/67, pulse (!) 59, temperature 97.7 F (36.5 C), temperature source Oral, resp. rate 11, height 5\' 9"  (1.753 m), weight 80.1 kg, SpO2 98 %. NAD, lyinig flat  RRR nl s1s2 CTAB trace LEE S/nt/nd nonfocal EOMI NCAT  A 1. AoCKD4, now ESRD; neg [place] during admission 2. Nephrotic proteinuria 3. Aoc sCHF, some responsiveness to  Diuretics 4. HTN, urgent, improved 5. Anemia: TSAT 17% and Ferritin 139 6. CKD BMD: PTH acceptable  P . HD #2 today: TDC, 3K, 2.5h, 1L UF, Qb 250, no heparin . For AVF with VVS today . CLIP in process . Cont IV Fe; start low dose ESA 4/28 22mcg  aranesp . Check Phos . Medication Issues; o Preferred narcotic agents for pain control are hydromorphone, fentanyl, and methadone. Morphine should not be used.  o Baclofen should be avoided o Avoid oral sodium phosphate and magnesium citrate based laxatives /  bowel preps    Justin Grippe MD 04/30/2018, 8:41 AM  Recent Labs  Lab 04/27/18 0215 04/28/18 0230 04/29/18 0239 04/29/18 1350  NA 139 140 141  --   K 3.6 3.7 3.7  --   CL 103 102 104  --   CO2 24 24 25   --   GLUCOSE 103* 127* 117*  --   BUN 70* 76* 80*  --   CREATININE 7.12* 7.16* 7.25*  --   CALCIUM 7.6* 7.6* 7.7* 7.9*   Recent Labs  Lab 04/24/18 1433 04/25/18 0429  04/28/18 0230 04/29/18 0239 04/30/18 0234  WBC 8.4 9.9   < > 9.6 8.5 8.2  NEUTROABS 6.4 7.6  --   --   --   --   HGB 11.7* 10.5*   < > 10.4* 10.1* 10.2*  HCT 36.8* 32.5*   < > 31.7* 31.0* 31.2*  MCV 88.7 89.0   < > 86.4 86.8 86.7  PLT 230 222   < > 219 231 236   < > = values in this interval not displayed.

## 2018-04-30 NOTE — Interval H&P Note (Signed)
Cath Lab Visit (complete for each Cath Lab visit)  Clinical Evaluation Leading to the Procedure:   ACS: Yes  Non-ACS:    Anginal Classification: CCS III  Anti-ischemic medical therapy: Minimal Therapy (1 class of medications)  Non-Invasive Test Results: No non-invasive testing performed  Prior CABG: No previous CABG      History and Physical Interval Note:  04/30/2018 4:18 PM  Justin [name]  has presented today for surgery, with the diagnosis of heart failure.  The various methods of treatment have been discussed with the patient and family. After consideration of risks, benefits and other options for treatment, the patient has consented to  Procedure(s): RIGHT/LEFT HEART CATH AND CORONARY ANGIOGRAPHY (N/A) as a surgical intervention.  The patient's history has been reviewed, patient examined, no change in status, stable for surgery.  I have reviewed the patient's chart and labs.  Questions were answered to the patient's satisfaction.     Belva Crome III

## 2018-04-30 NOTE — Progress Notes (Signed)
[facility] Progress Note  Patient: Justin [name] BJS:[id number]   PCP: Reynold Bowen, MD DOB: August 16, 1950   DOA: 04/24/2018   DOS: 04/30/2018   Date of Service: the patient was seen and examined on 04/30/2018  Brief hospital course: Pt. with PMH of hypertension, CKD stage IV and DM 2 ; admitted on 04/24/2018, presented with complaint of shortnes of breath, was found to have acute on chronic systolic CHF. Currently further plan is continue IV diuresis.  Subjective: No acute complaint no acute events. Waiting for the procedure  Assessment and Plan: Acute respiratory distress Acute systolic and diastolic CHF Elevated troponin Presents with complaints of shortness of breath and lower extremity swelling over the last week.  Patient with 2+ pitting lower extremity edema and crackles on lung exam.  BNP elevated at 1674 .1 and chest x-ray showing interstitial edema.  Patient with no prior history of heart disease. -Strict I&Os and daily weights -Elevate lower extremities -Lasix 40 mg IV Bid now on hold -Echocardiogram shows decreased EF.   - Will require further work-up. - Appreciate cardiology assistance, tentatively Wednesday cardiac catheterization  Hypertensive urgency:  On admission patient's blood pressure elevated 210/113.  Patient had been given nitroglycerin and lisinopril in the ED.   -Hold lisinopril due to acute kidney injury -Hydralazine IV as needed -Nephrology recommended starting isosorbide -hydralazine 20-37.5 mg 3 times daily  Acute renal failure superimposed on chronic kidney disease stage IV:  Previous noted to have creatinine around 3.1, but presents with creatinine 6.76 with BUN 32.   Suspect acute rise could be related with hypoperfusion related with heart failure. - Appreciate nephrology consultative services - Tolerated HD treatment, repeat HD per nephrology. - Will also get AV fistula placement as well as TDC placement and probably will be clipped at the time  of the discharge.   - Appreciate vascular surgery consultation, AVF on 04/30/2018  Insulin-dependent diabetes mellitus:  Patient normally takes Novolin 70/30 insulin 40 units every morning. -Hypoglycemic protocols -Reduced 70/30 insulin to 20 units daily while in hospital -CBGs q. before meals with sensitive sliding scale insulin -Adjust regimen as needed  Hyperlipidemia -Continue Crestor  Diet: Renal diet DVT Prophylaxis: subcutaneous Heparin  Advance goals of care discussion: Full code  Family Communication: no family was present at bedside, at the time of interview. D/W significant other 04/28/2018, after patient's permission.  Patient has a daughter but they are not involved.  Per significant other family includes parents and brother.  Disposition:  Discharge to home.  Consultants: Nephrology and cardiology Procedures: Echocardiogram  Scheduled Meds: . aspirin  81 mg Oral Daily  . Chlorhexidine Gluconate Cloth  6 each Topical Q0600  . darbepoetin (ARANESP) injection - DIALYSIS  25 mcg Intravenous Q Tue-HD  . insulin aspart  0-5 Units Subcutaneous QHS  . insulin aspart  0-9 Units Subcutaneous TID WC  . insulin aspart protamine- aspart  20 Units Subcutaneous Q breakfast  . isosorbide-hydrALAZINE  1 tablet Oral TID  . Living Better with Heart Failure Book   Does not apply Once  . metoprolol succinate  50 mg Oral Daily  . rosuvastatin  40 mg Oral Daily  . sodium chloride flush  3 mL Intravenous Q12H   Continuous Infusions: . sodium chloride 250 mL (04/30/18 1457)  . ferric gluconate (FERRLECIT/NULECIT) IV     PRN Meds: sodium chloride, acetaminophen, hydrALAZINE, ondansetron (ZOFRAN) IV, sodium chloride flush, zolpidem Antibiotics: Anti-infectives (From admission, onward)   Start     Dose/Rate Route Frequency Ordered  Stop   04/30/18 1530  ceFAZolin (ANCEF) IVPB 2g/100 mL premix     2 g 200 mL/hr over 30 Minutes Intravenous  Once 04/30/18 1523 04/30/18 1605   04/30/18  1525  ceFAZolin (ANCEF) 2-4 GM/100ML-% IVPB    Note to Pharmacy:  Judie Bonus, [place]   : cabinet override      04/30/18 1525 04/30/18 1600   04/29/18 1000  ceFAZolin (ANCEF) IVPB 2g/100 mL premix     2 g 200 mL/hr over 30 Minutes Intravenous To Radiology 04/29/18 0942 04/29/18 1310       Objective: Physical Exam: Vitals:   04/30/18 1715 04/30/18 1730 04/30/18 1744 04/30/18 1932  BP: (!) 97/50 (!) 99/53 134/61 (!) 159/61  Pulse: 61 62 60   Resp: 19 20 18 18   Temp: (!) 97.2 F (36.2 C)  (!) 97.2 F (36.2 C) (!) 97.5 F (36.4 C)  TempSrc:    Oral  SpO2: 97% 100% 93% 97%  Weight:      Height:        Intake/Output Summary (Last 24 hours) at 04/30/2018 1952 Last data filed at 04/30/2018 1711 Gross per 24 hour  Intake 250 ml  Output 10 ml  Net 240 ml   Filed Weights   04/29/18 0500 04/29/18 1340 04/30/18 0407  Weight: 80.9 kg 80.4 kg 80.1 kg   General: Alert, Awake and Oriented to Time, Place and Person. Appear in mild distress, affect appropriate Eyes: PERRL, Conjunctiva normal ENT: Oral Mucosa clear moist. Neck: no JVD, no Abnormal Mass Or lumps Cardiovascular: S1 and S2 Present, no Murmur, Peripheral Pulses Present Respiratory: normal respiratory effort, Bilateral Air entry equal and Decreased, no use of accessory muscle, Clear to Auscultation, no Crackles, no wheezes Abdomen: Bowel Sound present, Soft and no tenderness, no hernia Skin: no redness, no Rash, no induration Extremities: bilateral  Pedal edema, no calf tenderness Neurologic: Grossly no focal neuro deficit. Bilaterally Equal motor strength  Data Reviewed: CBC: Recent Labs  Lab 04/24/18 1433 04/25/18 0429 04/27/18 0215 04/28/18 0230 04/29/18 0239 04/30/18 0234  WBC 8.4 9.9 10.1 9.6 8.5 8.2  NEUTROABS 6.4 7.6  --   --   --   --   HGB 11.7* 10.5* 10.3* 10.4* 10.1* 10.2*  HCT 36.8* 32.5* 32.1* 31.7* 31.0* 31.2*  MCV 88.7 89.0 88.4 86.4 86.8 86.7  PLT 230 222 217 219 231 244   Basic Metabolic Panel:  Recent Labs  Lab 04/25/18 0429 04/26/18 0317 04/27/18 0215 04/28/18 0230 04/29/18 0239 04/29/18 1350 04/30/18 0234  NA 145 141 139 140 141  --   --   K 3.6 3.6 3.6 3.7 3.7  --   --   CL 111 106 103 102 104  --   --   CO2 20* 21* 24 24 25   --   --   GLUCOSE 81 110* 103* 127* 117*  --   --   BUN 56* 60* 70* 76* 80*  --   --   CREATININE 6.93* 7.04* 7.12* 7.16* 7.25*  --   --   CALCIUM 7.6* 8.0* 7.6* 7.6* 7.7* 7.9*  --   MG  --  1.9 2.0 2.1 2.2  --  2.2  PHOS  --   --   --   --   --   --  4.9*    Liver Function Tests: Recent Labs  Lab 04/24/18 1433  AST 25  ALT 26  ALKPHOS 86  BILITOT 0.4  PROT 5.5*  ALBUMIN 2.9*   No  results for input(s): LIPASE, AMYLASE in the last 168 hours. No results for input(s): AMMONIA in the last 168 hours. Coagulation Profile: Recent Labs  Lab 04/29/18 0239  INR 1.3*   Cardiac Enzymes: Recent Labs  Lab 04/24/18 1811 04/24/18 2240 04/25/18 0429  TROPONINI 0.05* 0.06* 0.07*   BNP (last 3 results) No results for input(s): PROBNP in the last 8760 hours. CBG: Recent Labs  Lab 04/30/18 0621 04/30/18 1053 04/30/18 1429 04/30/18 1455 04/30/18 1731  GLUCAP 117* 110* 62* 173* 130*   Studies: No results found.   Time spent: 35 minutes  Author: Berle Mull, MD [facility] 04/30/2018 7:52 PM  To reach On-call, see care teams to locate the attending and reach out to them via [email or web address]. If 7PM-7AM, please contact night-coverage If you still have difficulty reaching the attending provider, please page the [facility] (Director on Call) for [facility] on amion for assistance.

## 2018-04-30 NOTE — Progress Notes (Signed)
Progress Note  Patient Name: Justin [name] Date of Encounter: 04/30/2018  Primary Cardiologist: Kirk Ruths, MD  New  Subjective   Doing well, dialysis session went well.  Going for session #2 today.  No chest pain.  He is frustrated that he is n.p.o.  We talked.  Inpatient Medications    Scheduled Meds:  aspirin  81 mg Oral Daily   Chlorhexidine Gluconate Cloth  6 each Topical Q0600   darbepoetin (ARANESP) injection - DIALYSIS  25 mcg Intravenous Q Tue-HD   insulin aspart  0-5 Units Subcutaneous QHS   insulin aspart  0-9 Units Subcutaneous TID WC   insulin aspart protamine- aspart  20 Units Subcutaneous Q breakfast   isosorbide-hydrALAZINE  1 tablet Oral TID   Living Better with Heart Failure Book   Does not apply Once   metoprolol succinate  50 mg Oral Daily   rosuvastatin  40 mg Oral Daily   sodium chloride flush  3 mL Intravenous Q12H   Continuous Infusions:  sodium chloride     ferric gluconate (FERRLECIT/NULECIT) IV     PRN Meds: sodium chloride, acetaminophen, hydrALAZINE, ondansetron (ZOFRAN) IV, sodium chloride flush, zolpidem   Vital Signs    Vitals:   04/29/18 1944 04/30/18 0002 04/30/18 0407 04/30/18 0739  BP: 133/64 (!) 126/57 (!) 151/66 (!) 159/67  Pulse: 63 (!) 59 (!) 59   Resp: 17 19 11    Temp: 97.8 F (36.6 C) 98.3 F (36.8 C) 98.1 F (36.7 C) 97.7 F (36.5 C)  TempSrc: Oral Oral Oral Oral  SpO2: 93% 99% 98%   Weight:   80.1 kg   Height:       No intake or output data in the 24 hours ending 04/30/18 0923 Last 3 Weights 04/30/2018 04/29/2018 04/29/2018  Weight (lbs) 176 lb 9.4 oz 177 lb 4 oz 178 lb 5.6 oz  Weight (kg) 80.1 kg 80.4 kg 80.9 kg      Telemetry    Sinus rhythm, T wave inversions noted, no changes today- Personally Reviewed  ECG    Normal sinus rhythm- Personally Reviewed  Physical Exam   GEN: Well nourished, well developed, in no acute distress  HEENT: normal  Neck: no JVD, carotid bruits, or  masses Cardiac: RRR; no murmurs, rubs, or gallops,no edema, dialysis catheter chest wall Respiratory:  clear to auscultation bilaterally, normal work of breathing GI: soft, nontender, nondistended, + BS MS: no deformity or atrophy  Skin: warm and dry, no rash Neuro:  Alert and Oriented x 3, Strength and sensation are intact Psych: euthymic mood, full affect      Labs    Chemistry Recent Labs  Lab 04/24/18 1433  04/27/18 0215 04/28/18 0230 04/29/18 0239 04/29/18 1350  NA 143   < > 139 140 141  --   K 3.7   < > 3.6 3.7 3.7  --   CL 113*   < > 103 102 104  --   CO2 21*   < > 24 24 25   --   GLUCOSE 86   < > 103* 127* 117*  --   BUN 52*   < > 70* 76* 80*  --   CREATININE 6.76*   < > 7.12* 7.16* 7.25*  --   CALCIUM 7.7*   < > 7.6* 7.6* 7.7* 7.9*  PROT 5.5*  --   --   --   --   --   ALBUMIN 2.9*  --   --   --   --   --  AST 25  --   --   --   --   --   ALT 26  --   --   --   --   --   ALKPHOS 86  --   --   --   --   --   BILITOT 0.4  --   --   --   --   --   GFRNONAA 8*   < > 7* 7* 7*  --   GFRAA 9*   < > 8* 8* 8*  --   ANIONGAP 9   < > 12 14 12   --    < > = values in this interval not displayed.     Hematology Recent Labs  Lab 04/28/18 0230 04/29/18 0239 04/30/18 0234  WBC 9.6 8.5 8.2  RBC 3.67* 3.57* 3.60*  HGB 10.4* 10.1* 10.2*  HCT 31.7* 31.0* 31.2*  MCV 86.4 86.8 86.7  MCH 28.3 28.3 28.3  MCHC 32.8 32.6 32.7  RDW 13.8 13.7 13.7  PLT 219 231 236    Cardiac Enzymes Recent Labs  Lab 04/24/18 1811 04/24/18 2240 04/25/18 0429  TROPONINI 0.05* 0.06* 0.07*    Recent Labs  Lab 04/24/18 1455  TROPIPOC 0.05     BNP Recent Labs  Lab 04/24/18 1434  BNP 1,674.1*     DDimer No results for input(s): DDIMER in the last 168 hours.   Radiology    Ir Cyndy Freeze Guide Cv Line Right  Result Date: 04/29/2018 INDICATION: 68 year old with end-stage renal disease and starting dialysis. Patient needs dialysis catheter. EXAM: FLUOROSCOPIC AND ULTRASOUND GUIDED  PLACEMENT OF A TUNNELED DIALYSIS CATHETER Physician: Stephan Minister. Anselm Pancoast, MD MEDICATIONS: Ancef 2 g; The antibiotic was administered within an appropriate time interval prior to skin puncture. ANESTHESIA/SEDATION: Versed 1.0 mg IV; Fentanyl 25 mcg IV; Moderate Sedation Time:  26 The patient was continuously monitored during the procedure by the interventional radiology nurse under my direct supervision. FLUOROSCOPY TIME:  Fluoroscopy Time: 36 seconds, 4 mGy COMPLICATIONS: None immediate. PROCEDURE: The procedure was explained to the patient. The risks and benefits of the procedure were discussed and the patient's questions were addressed. Informed consent was obtained from the patient. The patient was placed supine on the interventional table. Ultrasound confirmed a patent right internal jugular vein. Ultrasound images were obtained for documentation. The right side of the neck and chest was prepped and draped in a sterile fashion. The right neck was anesthetized with 1% lidocaine. Maximal barrier sterile technique was utilized including caps, mask, sterile gowns, sterile gloves, sterile drape, hand hygiene and skin antiseptic. A small incision was made with #11 blade scalpel. A 21 gauge needle directed into the right internal jugular vein with ultrasound guidance. A micropuncture dilator set was placed. A 23 cm tip to cuff Palindrome catheter was selected. The skin below the right clavicle was anesthetized and a small incision was made with an #11 blade scalpel. A subcutaneous tunnel was formed to the vein dermatotomy site. The catheter was brought through the tunnel. The vein dermatotomy site was dilated to accommodate a peel-away sheath. The catheter was placed through the peel-away sheath and directed into the central venous structures. The tip of the catheter was placed at the SVC/right atrium junction with fluoroscopy. Fluoroscopic images were obtained for documentation. Both lumens were found to aspirate and flush  well. The proper amount of heparin was flushed in both lumens. The vein dermatotomy site was closed using a single layer of absorbable suture and  Dermabond. Gel-Foam was placed within the subcutaneous tract. The catheter was secured to the skin using Prolene suture. IMPRESSION: Successful placement of a right jugular tunneled dialysis catheter using ultrasound and fluoroscopic guidance. Electronically Signed   By: Markus Daft M.D.   On: 04/29/2018 13:50   Ir US Guide Vasc Access Right  Result Date: 04/29/2018 INDICATION: 68 year old with end-stage renal disease and starting dialysis. Patient needs dialysis catheter. EXAM: FLUOROSCOPIC AND ULTRASOUND GUIDED PLACEMENT OF A TUNNELED DIALYSIS CATHETER Physician: Stephan Minister. Anselm Pancoast, MD MEDICATIONS: Ancef 2 g; The antibiotic was administered within an appropriate time interval prior to skin puncture. ANESTHESIA/SEDATION: Versed 1.0 mg IV; Fentanyl 25 mcg IV; Moderate Sedation Time:  26 The patient was continuously monitored during the procedure by the interventional radiology nurse under my direct supervision. FLUOROSCOPY TIME:  Fluoroscopy Time: 36 seconds, 4 mGy COMPLICATIONS: None immediate. PROCEDURE: The procedure was explained to the patient. The risks and benefits of the procedure were discussed and the patient's questions were addressed. Informed consent was obtained from the patient. The patient was placed supine on the interventional table. Ultrasound confirmed a patent right internal jugular vein. Ultrasound images were obtained for documentation. The right side of the neck and chest was prepped and draped in a sterile fashion. The right neck was anesthetized with 1% lidocaine. Maximal barrier sterile technique was utilized including caps, mask, sterile gowns, sterile gloves, sterile drape, hand hygiene and skin antiseptic. A small incision was made with #11 blade scalpel. A 21 gauge needle directed into the right internal jugular vein with ultrasound guidance. A  micropuncture dilator set was placed. A 23 cm tip to cuff Palindrome catheter was selected. The skin below the right clavicle was anesthetized and a small incision was made with an #11 blade scalpel. A subcutaneous tunnel was formed to the vein dermatotomy site. The catheter was brought through the tunnel. The vein dermatotomy site was dilated to accommodate a peel-away sheath. The catheter was placed through the peel-away sheath and directed into the central venous structures. The tip of the catheter was placed at the SVC/right atrium junction with fluoroscopy. Fluoroscopic images were obtained for documentation. Both lumens were found to aspirate and flush well. The proper amount of heparin was flushed in both lumens. The vein dermatotomy site was closed using a single layer of absorbable suture and Dermabond. Gel-Foam was placed within the subcutaneous tract. The catheter was secured to the skin using Prolene suture. IMPRESSION: Successful placement of a right jugular tunneled dialysis catheter using ultrasound and fluoroscopic guidance. Electronically Signed   By: Markus Daft M.D.   On: 04/29/2018 13:50   Vas [place] Upper Ext Vein Mapping (pre-op Avf)  Result Date: 04/28/2018 UPPER EXTREMITY VEIN MAPPING  Indications: Pre-access. Performing Technologist: Abram Sander RVS  Examination Guidelines: A complete evaluation includes B-mode imaging, spectral Doppler, color Doppler, and power Doppler as needed of all accessible portions of each vessel. Bilateral testing is considered an integral part of a complete examination. Limited examinations for reoccurring indications may be performed as noted. +-----------------+-------------+----------+--------+  Right Cephalic    Diameter (cm) Depth (cm) Findings  +-----------------+-------------+----------+--------+  Shoulder              0.47         1.29              +-----------------+-------------+----------+--------+  Prox upper arm        0.45         0.89               +-----------------+-------------+----------+--------+  Mid upper arm         0.42         0.31              +-----------------+-------------+----------+--------+  Dist upper arm        0.43         0.37              +-----------------+-------------+----------+--------+  Antecubital fossa     0.47         0.30              +-----------------+-------------+----------+--------+  Prox forearm          0.36         0.38              +-----------------+-------------+----------+--------+  Mid forearm           0.30         0.16              +-----------------+-------------+----------+--------+  Dist forearm          0.29         0.21              +-----------------+-------------+----------+--------+  Wrist                 0.31         0.19              +-----------------+-------------+----------+--------+ +-----------------+-------------+----------+--------+  Right Basilic     Diameter (cm) Depth (cm) Findings  +-----------------+-------------+----------+--------+  Prox upper arm        0.62         1.67              +-----------------+-------------+----------+--------+  Mid upper arm         0.52         1.04              +-----------------+-------------+----------+--------+  Dist upper arm        0.46         0.39              +-----------------+-------------+----------+--------+  Antecubital fossa     0.29         0.34              +-----------------+-------------+----------+--------+  Prox forearm          0.22         0.16              +-----------------+-------------+----------+--------+  Mid forearm           0.23         0.15              +-----------------+-------------+----------+--------+  Distal forearm        0.20         0.17              +-----------------+-------------+----------+--------+ +-----------------+-------------+----------+--------+  Left Cephalic     Diameter (cm) Depth (cm) Findings  +-----------------+-------------+----------+--------+  Shoulder              0.49         1.46               +-----------------+-------------+----------+--------+  Prox upper arm        0.56         1.05              +-----------------+-------------+----------+--------+  Mid upper arm         0.48         0.56              +-----------------+-------------+----------+--------+  Dist upper arm        0.50         0.49              +-----------------+-------------+----------+--------+  Antecubital fossa     0.36         0.46              +-----------------+-------------+----------+--------+  Prox forearm          0.42         0.54              +-----------------+-------------+----------+--------+  Mid forearm           0.34         0.26              +-----------------+-------------+----------+--------+  Dist forearm          0.36         0.19              +-----------------+-------------+----------+--------+  Wrist                 0.39         0.83              +-----------------+-------------+----------+--------+ +-----------------+-------------+----------+--------+  Left Basilic      Diameter (cm) Depth (cm) Findings  +-----------------+-------------+----------+--------+  Prox upper arm        0.40         0.63              +-----------------+-------------+----------+--------+  Mid upper arm         0.40         0.58              +-----------------+-------------+----------+--------+  Dist upper arm        0.37         0.63              +-----------------+-------------+----------+--------+  Antecubital fossa     0.37         0.26              +-----------------+-------------+----------+--------+  Prox forearm          0.28         0.22              +-----------------+-------------+----------+--------+  Mid forearm           0.28         0.21              +-----------------+-------------+----------+--------+  Distal forearm        0.28         0.23              +-----------------+-------------+----------+--------+ *See table(s) above for measurements and observations.  Diagnosing physician: Monica Martinez MD Electronically  signed by Monica Martinez MD on 04/28/2018 at 11:57:00 AM.    Final     Cardiac Studies   EF 35 to 40%-moderate aortic stenosis  Patient Profile     69 y.o. male with EF 35 to 40% here with acute combined systolic and diastolic heart failure with cardiomyopathy of unclear etiology  Assessment & Plan    Acute systolic and diastolic heart failure - Cardiac catheterization  tomorrow Wednesday.  AV fistula placement today.   -Toprol.  Heart rate currently upper 50s.  No ACE inhibitor because of chronic kidney disease.  Elevated troponin - Possibly secondary to underlying coronary artery disease as well as heart failure, demand ischemia phenomenon.  Heart catheterization pending tomorrow.  Discussed with him risk versus benefits stroke heart attack death bleeding.  Diabetes with hypertension. - Per primary team.  Blood pressure continues to be under good control.  No changes  Aortic stenosis -Moderate on echo- right and left heart catheterization Wednesday would be helpful for evaluation.  No changes.  Essential hypertension - Appreciate nephrology's assistance as well.  Avoiding ACE inhibitor.  BiDil and Toprol.  No changes again.  Improved.  With hemodialysis.  Stage IV-V chronic kidney disease. -Per nephrology.  Tolerating hemodialysis well.  AV fistula today.      For questions or updates, please contact [place] Please consult [email or web address] for contact info under        Signed, Candee Furbish, MD  04/30/2018, 9:23 AM

## 2018-04-30 NOTE — Anesthesia Preprocedure Evaluation (Signed)
Anesthesia Evaluation  Patient identified by MRN, date of birth, ID band Patient awake    Reviewed: Allergy & Precautions, NPO status , Patient's Chart, lab work & pertinent test results  Airway Mallampati: II  TM Distance: >3 FB Neck ROM: Full    Dental no notable dental hx.    Pulmonary neg pulmonary ROS,    Pulmonary exam normal breath sounds clear to auscultation       Cardiovascular hypertension, +CHF  + Valvular Problems/Murmurs AS  Rhythm:Regular Rate:Normal + Systolic murmurs . The left ventricle has moderately reduced systolic function, with an ejection fraction of 35-40%. The cavity size was moderately dilated. Left ventricular diastolic parameters were normal.  2. Inferior and septal hypokinesis.  3. The right ventricle has normal systolc function. The cavity was mildly enlarged. There is no increase in right ventricular wall thickness.  4. Left atrial size was moderately dilated.  5. Small pericardial effusion.  6. Small non circumferential pericardial effusion no tamponade.  7. Mild thickening of the mitral valve leaflet.  8. The aortic valve is tricuspid Moderate thickening of the aortic valve Moderate calcification of the aortic valve. Moderate stenosis of the aortic valve.  9. Likely tri leaflet given degree of LV dysfunction would grade AS as moderate. 10. The aortic root is normal in size and structure. 11. The inferior vena cava was dilated in size with <50% respiratory variability.  FINDINGS  Left Ventricle: The left ventricle has moderately reduced systolic function, with an ejection fraction of 35-40%. The cavity size was moderately dilated. There is no increase in left ventricular wall thickness. Left ventricular diastolic parameters were  normal. Inferior and septal hypokinesis   Neuro/Psych negative neurological ROS  negative psych ROS   GI/Hepatic negative GI ROS, Neg liver ROS,   Endo/Other   diabetes, Insulin Dependent  Renal/GU ESRFRenal disease  negative genitourinary   Musculoskeletal negative musculoskeletal ROS (+)   Abdominal   Peds negative pediatric ROS (+)  Hematology negative hematology ROS (+)   Anesthesia Other Findings   Reproductive/Obstetrics negative OB ROS                             Anesthesia Physical Anesthesia Plan  ASA: IV  Anesthesia Plan: MAC   Post-op Pain Management:    Induction: Intravenous  PONV Risk Score and Plan: 1 and Ondansetron  Airway Management Planned: Simple Face Mask  Additional Equipment:   Intra-op Plan:   Post-operative Plan:   Informed Consent: I have reviewed the patients History and Physical, chart, labs and discussed the procedure including the risks, benefits and alternatives for the proposed anesthesia with the patient or authorized representative who has indicated his/her understanding and acceptance.     Dental advisory given  Plan Discussed with: CRNA and Surgeon  Anesthesia Plan Comments:         Anesthesia Quick Evaluation

## 2018-05-01 ENCOUNTER — Telehealth: Payer: Self-pay | Admitting: *Deleted

## 2018-05-01 ENCOUNTER — Encounter (HOSPITAL_COMMUNITY)
Admission: EM | Disposition: A | Discharge status: Home Health | Payer: Self-pay | Source: Home / Self Care | Attending: Internal Medicine

## 2018-05-01 ENCOUNTER — Encounter (HOSPITAL_COMMUNITY): Payer: Self-pay | Admitting: Vascular Surgery

## 2018-05-01 DIAGNOSIS — I350 Nonrheumatic aortic (valve) stenosis: Secondary | ICD-10-CM

## 2018-05-01 DIAGNOSIS — N189 Chronic kidney disease, unspecified: Secondary | ICD-10-CM | POA: Insufficient documentation

## 2018-05-01 DIAGNOSIS — R0602 Shortness of breath: Secondary | ICD-10-CM

## 2018-05-01 DIAGNOSIS — E119 Type 2 diabetes mellitus without complications: Secondary | ICD-10-CM

## 2018-05-01 DIAGNOSIS — D509 Iron deficiency anemia, unspecified: Secondary | ICD-10-CM

## 2018-05-01 DIAGNOSIS — L299 Pruritus, unspecified: Secondary | ICD-10-CM | POA: Insufficient documentation

## 2018-05-01 DIAGNOSIS — D689 Coagulation defect, unspecified: Secondary | ICD-10-CM

## 2018-05-01 DIAGNOSIS — N186 End stage renal disease: Secondary | ICD-10-CM

## 2018-05-01 DIAGNOSIS — I5021 Acute systolic (congestive) heart failure: Secondary | ICD-10-CM

## 2018-05-01 DIAGNOSIS — N2581 Secondary hyperparathyroidism of renal origin: Secondary | ICD-10-CM

## 2018-05-01 DIAGNOSIS — R52 Pain, unspecified: Secondary | ICD-10-CM | POA: Insufficient documentation

## 2018-05-01 DIAGNOSIS — R197 Diarrhea, unspecified: Secondary | ICD-10-CM | POA: Insufficient documentation

## 2018-05-01 DIAGNOSIS — I129 Hypertensive chronic kidney disease with stage 1 through stage 4 chronic kidney disease, or unspecified chronic kidney disease: Secondary | ICD-10-CM

## 2018-05-01 DIAGNOSIS — I2510 Atherosclerotic heart disease of native coronary artery without angina pectoris: Secondary | ICD-10-CM

## 2018-05-01 DIAGNOSIS — I10 Essential (primary) hypertension: Secondary | ICD-10-CM

## 2018-05-01 DIAGNOSIS — D631 Anemia in chronic kidney disease: Secondary | ICD-10-CM

## 2018-05-01 DIAGNOSIS — Z4901 Encounter for fitting and adjustment of extracorporeal dialysis catheter: Secondary | ICD-10-CM | POA: Insufficient documentation

## 2018-05-01 HISTORY — DX: Anemia in chronic kidney disease: D63.1

## 2018-05-01 HISTORY — DX: Shortness of breath: R06.02

## 2018-05-01 HISTORY — DX: Iron deficiency anemia, unspecified: D50.9

## 2018-05-01 HISTORY — DX: Coagulation defect, unspecified: D68.9

## 2018-05-01 HISTORY — DX: Secondary hyperparathyroidism of renal origin: N25.81

## 2018-05-01 HISTORY — DX: Pain, unspecified: R52

## 2018-05-01 HISTORY — DX: Pruritus, unspecified: L29.9

## 2018-05-01 HISTORY — PX: RIGHT/LEFT HEART CATH AND CORONARY ANGIOGRAPHY: CATH118266

## 2018-05-01 LAB — CBC
HCT: 31.3 % — ABNORMAL LOW (ref 39.0–52.0)
Hemoglobin: 10.1 g/dL — ABNORMAL LOW (ref 13.0–17.0)
MCH: 28.5 pg (ref 26.0–34.0)
MCHC: 32.3 g/dL (ref 30.0–36.0)
MCV: 88.4 fL (ref 80.0–100.0)
Platelets: 239 10*3/uL (ref 150–400)
RBC: 3.54 MIL/uL — ABNORMAL LOW (ref 4.22–5.81)
RDW: 13.8 % (ref 11.5–15.5)
WBC: 8.5 10*3/uL (ref 4.0–10.5)
nRBC: 0 % (ref 0.0–0.2)

## 2018-05-01 LAB — POCT I-STAT 7, (LYTES, BLD GAS, ICA,H+H)
Acid-Base Excess: 1 mmol/L (ref 0.0–2.0)
Bicarbonate: 26.4 mmol/L (ref 20.0–28.0)
Calcium, Ion: 1.12 mmol/L — ABNORMAL LOW (ref 1.15–1.40)
HCT: 29 % — ABNORMAL LOW (ref 39.0–52.0)
Hemoglobin: 9.9 g/dL — ABNORMAL LOW (ref 13.0–17.0)
O2 Saturation: 95 %
Potassium: 3.9 mmol/L (ref 3.5–5.1)
Sodium: 136 mmol/L (ref 135–145)
TCO2: 28 mmol/L (ref 22–32)
pCO2 arterial: 43 mmHg (ref 32.0–48.0)
pH, Arterial: 7.397 (ref 7.350–7.450)
pO2, Arterial: 74 mmHg — ABNORMAL LOW (ref 83.0–108.0)

## 2018-05-01 LAB — RENAL FUNCTION PANEL
Albumin: 2.4 g/dL — ABNORMAL LOW (ref 3.5–5.0)
Anion gap: 10 (ref 5–15)
BUN: 61 mg/dL — ABNORMAL HIGH (ref 8–23)
CO2: 26 mmol/L (ref 22–32)
Calcium: 7.9 mg/dL — ABNORMAL LOW (ref 8.9–10.3)
Chloride: 104 mmol/L (ref 98–111)
Creatinine, Ser: 5.96 mg/dL — ABNORMAL HIGH (ref 0.61–1.24)
GFR calc Af Amer: 10 mL/min — ABNORMAL LOW (ref >60)
GFR calc non Af Amer: 9 mL/min — ABNORMAL LOW (ref >60)
Glucose, Bld: 152 mg/dL — ABNORMAL HIGH (ref 70–99)
Phosphorus: 5.5 mg/dL — ABNORMAL HIGH (ref 2.5–4.6)
Potassium: 4.5 mmol/L (ref 3.5–5.1)
Sodium: 140 mmol/L (ref 135–145)

## 2018-05-01 LAB — GLUCOSE, CAPILLARY
Glucose-Capillary: 127 mg/dL — ABNORMAL HIGH (ref 70–99)
Glucose-Capillary: 114 mg/dL — ABNORMAL HIGH (ref 70–99)
Glucose-Capillary: 134 mg/dL — ABNORMAL HIGH (ref 70–99)
Glucose-Capillary: 134 mg/dL — ABNORMAL HIGH (ref 70–99)

## 2018-05-01 LAB — POCT I-STAT EG7
Acid-Base Excess: 4 mmol/L — ABNORMAL HIGH (ref 0.0–2.0)
Bicarbonate: 30 mmol/L — ABNORMAL HIGH (ref 20.0–28.0)
Calcium, Ion: 1.15 mmol/L (ref 1.15–1.40)
HCT: 30 % — ABNORMAL LOW (ref 39.0–52.0)
Hemoglobin: 10.2 g/dL — ABNORMAL LOW (ref 13.0–17.0)
O2 Saturation: 63 %
Potassium: 4 mmol/L (ref 3.5–5.1)
Sodium: 139 mmol/L (ref 135–145)
TCO2: 31 mmol/L (ref 22–32)
pCO2, Ven: 50.1 mmHg (ref 44.0–60.0)
pH, Ven: 7.385 (ref 7.250–7.430)
pO2, Ven: 34 mmHg (ref 32.0–45.0)

## 2018-05-01 LAB — MAGNESIUM: Magnesium: 2.1 mg/dL (ref 1.7–2.4)

## 2018-05-01 SURGERY — RIGHT/LEFT HEART CATH AND CORONARY ANGIOGRAPHY
Anesthesia: LOCAL

## 2018-05-01 MED ORDER — NITROGLYCERIN 1 MG/10 ML FOR IR/CATH LAB
INTRA_ARTERIAL | Status: AC
  Filled 2018-05-01: qty 10

## 2018-05-01 MED ORDER — IOHEXOL 350 MG/ML SOLN
INTRAVENOUS | Status: DC | PRN
  Administered 2018-05-01: 10:00:00 85 mL via INTRA_ARTERIAL

## 2018-05-01 MED ORDER — SODIUM CHLORIDE 0.9 % IV SOLN: 250.0000 mL | INTRAVENOUS | Status: DC | PRN

## 2018-05-01 MED ORDER — SODIUM CHLORIDE 0.9% FLUSH
3.0000 mL | Freq: Two times a day (BID) | INTRAVENOUS | Status: DC
  Administered 2018-05-01: 3 mL via INTRAVENOUS

## 2018-05-01 MED ORDER — HEPARIN (PORCINE) IN NACL 1000-0.9 UT/500ML-% IV SOLN
INTRAVENOUS | Status: DC | PRN
  Administered 2018-05-01 (×2): 500 mL

## 2018-05-01 MED ORDER — ONDANSETRON HCL 4 MG/2ML IJ SOLN: 4.0000 mg | Freq: Four times a day (QID) | INTRAMUSCULAR | Status: DC | PRN

## 2018-05-01 MED ORDER — LIDOCAINE HCL (PF) 1 % IJ SOLN
INTRAMUSCULAR | Status: AC
  Filled 2018-05-01: qty 30

## 2018-05-01 MED ORDER — FENTANYL CITRATE (PF) 100 MCG/2ML IJ SOLN
INTRAMUSCULAR | Status: AC
  Filled 2018-05-01: qty 2

## 2018-05-01 MED ORDER — HEPARIN SODIUM (PORCINE) 1000 UNIT/ML IJ SOLN
INTRAMUSCULAR | Status: AC
  Filled 2018-05-01: qty 1

## 2018-05-01 MED ORDER — LABETALOL HCL 5 MG/ML IV SOLN: 10.0000 mg | INTRAVENOUS | Status: AC | PRN

## 2018-05-01 MED ORDER — LIDOCAINE HCL (PF) 1 % IJ SOLN
INTRAMUSCULAR | Status: DC | PRN
  Administered 2018-05-01: 15 mL via SUBCUTANEOUS

## 2018-05-01 MED ORDER — ASPIRIN EC 81 MG PO TBEC
81.0000 mg | DELAYED_RELEASE_TABLET | Freq: Every day | ORAL | Status: DC
  Administered 2018-05-02: 09:00:00 81 mg via ORAL
  Filled 2018-05-01: qty 1

## 2018-05-01 MED ORDER — HEPARIN (PORCINE) IN NACL 1000-0.9 UT/500ML-% IV SOLN
INTRAVENOUS | Status: AC
  Filled 2018-05-01: qty 1000

## 2018-05-01 MED ORDER — HEPARIN (PORCINE) IN NACL 1000-0.9 UT/500ML-% IV SOLN
INTRAVENOUS | Status: AC
  Filled 2018-05-01: qty 500

## 2018-05-01 MED ORDER — VERAPAMIL HCL 2.5 MG/ML IV SOLN
INTRAVENOUS | Status: AC
  Filled 2018-05-01: qty 2

## 2018-05-01 MED ORDER — HEPARIN SODIUM (PORCINE) 5000 UNIT/ML IJ SOLN
5000.0000 [IU] | Freq: Three times a day (TID) | INTRAMUSCULAR | Status: DC
  Administered 2018-05-01 – 2018-05-02 (×2): 5000 [IU] via SUBCUTANEOUS
  Filled 2018-05-01 (×2): qty 1

## 2018-05-01 MED ORDER — FENTANYL CITRATE (PF) 100 MCG/2ML IJ SOLN
INTRAMUSCULAR | Status: DC | PRN
  Administered 2018-05-01: 25 ug via INTRAVENOUS

## 2018-05-01 MED ORDER — MIDAZOLAM HCL 2 MG/2ML IJ SOLN
INTRAMUSCULAR | Status: AC
  Filled 2018-05-01: qty 2

## 2018-05-01 MED ORDER — OXYCODONE HCL 5 MG PO TABS: 5.0000 mg | ORAL_TABLET | ORAL | Status: DC | PRN

## 2018-05-01 MED ORDER — ACETAMINOPHEN 325 MG PO TABS: 650.0000 mg | ORAL_TABLET | ORAL | Status: DC | PRN

## 2018-05-01 MED ORDER — HYDRALAZINE HCL 20 MG/ML IJ SOLN: 10.0000 mg | INTRAMUSCULAR | Status: AC | PRN

## 2018-05-01 MED ORDER — SODIUM CHLORIDE 0.9% FLUSH: 3.0000 mL | INTRAVENOUS | Status: DC | PRN

## 2018-05-01 MED ORDER — MIDAZOLAM HCL 2 MG/2ML IJ SOLN
INTRAMUSCULAR | Status: DC | PRN
  Administered 2018-05-01: 0.5 mg via INTRAVENOUS

## 2018-05-01 SURGICAL SUPPLY — 12 items
CATH INFINITI 5 FR MPA2 (CATHETERS) ×2 IMPLANT
CATH INFINITI 5FR MULTPACK ANG (CATHETERS) ×2 IMPLANT
CATH SWAN GANZ 7F STRAIGHT (CATHETERS) ×2 IMPLANT
CLOSURE MYNX CONTROL 5F (Vascular Products) ×2 IMPLANT
KIT HEART LEFT (KITS) ×2 IMPLANT
PACK CARDIAC CATHETERIZATION (CUSTOM PROCEDURE TRAY) ×2 IMPLANT
SHEATH PINNACLE 5F 10CM (SHEATH) ×2 IMPLANT
SHEATH PINNACLE 7F 10CM (SHEATH) ×2 IMPLANT
SHEATH PROBE COVER 6X72 (BAG) ×2 IMPLANT
TRANSDUCER W/STOPCOCK (MISCELLANEOUS) ×2 IMPLANT
TUBING CIL FLEX 10 FLL-RA (TUBING) ×2 IMPLANT
WIRE EMERALD 3MM-J .035X150CM (WIRE) ×2 IMPLANT

## 2018-05-01 NOTE — Progress Notes (Signed)
Renal Navigator received notification from Dr. Cristal Generous of plan for patient discharge today. Renal Navigator notified OP HD clinic of discharge plan. Patient will have first treatment at OP HD treatment tomorrow 05/02/18 at [facility] at 11:30am. He will need to arrive at 10:45am to sign paperwork. Renal Navigator notified Renal NP/S. Collins for orders.  [facility] [address], [place], [place] [address] [phone]

## 2018-05-01 NOTE — Progress Notes (Signed)
Pt left unit for cath lab.care continues.

## 2018-05-01 NOTE — TOC Initial Note (Signed)
Transition of Care [facility]) - Initial/Assessment Note    Patient Details  Name: Justin [name] MRN: [id number] Date of Birth: [birth year]  [facility] of [facility]) CM/SW Contact:    Maryclare Labrador, RN Phone Number: 05/01/2018, 2:51 PM  Clinical Narrative:      PTA independent from home with mom.  Pt will be new to HD - pt informed CM that family has agreed to transport him to and from HD.  Pt confirms he has a PCP and denied barriers with paying for meds.  Pt educated on importance of daily weights and low salt diet.  Pt confirmed with CM that he remains mobile and able to perform ADLs (pt has been discussed during progression M-F each am - pt has maintained independence with ambulation throughout hospitalization.               Expected Discharge Plan: [place] Barriers to Discharge: Waiting for outpatient dialysis(new to HD will need clipping and perm access)   Patient Goals and CMS Choice        Expected Discharge Plan and Services Expected Discharge Plan: [place]         Expected Discharge Date: 04/26/18                                    Prior Living Arrangements/Services     Patient language and need for interpreter reviewed:: Yes Do you feel safe going back to the place where you live?: Yes      Need for Family Participation in Patient Care: Yes (Comment) Care giver support system in place?: Yes (comment)   Criminal Activity/Legal Involvement Pertinent to Current Situation/Hospitalization: No - Comment as needed  Activities of Daily Living Home Assistive Devices/Equipment: None ADL Screening (condition at time of admission) Patient's cognitive ability adequate to safely complete daily activities?: Yes Is the patient deaf or have difficulty hearing?: No Does the patient have difficulty seeing, even when wearing glasses/contacts?: No Does the patient have difficulty concentrating, remembering, or making decisions?:  No Patient able to express need for assistance with ADLs?: Yes Does the patient have difficulty dressing or bathing?: No Independently performs ADLs?: Yes (appropriate for developmental age) Does the patient have difficulty walking or climbing stairs?: No Weakness of Legs: Both Weakness of Arms/Hands: None  Permission Sought/Granted                  Emotional Assessment     Affect (typically observed): Accepting, Adaptable Orientation: : Oriented to Self, Oriented to Place, Oriented to  Time, Oriented to Situation   Psych Involvement: No (comment)  Admission diagnosis:  ARF (acute renal failure) (HCC) [N17.9] Hypertensive emergency [I16.1] Acute renal failure, unspecified acute renal failure type ([place]) [N17.9] Acute congestive heart failure, unspecified heart failure type [facility]) [I50.9] Patient Active Problem List   Diagnosis Date Noted  . CKD (chronic kidney disease)   . Nonrheumatic aortic valve stenosis   . Acute congestive heart failure ([place]) 04/24/2018  . Acute respiratory distress 04/24/2018  . Hypertensive urgency 04/24/2018  . Acute renal failure superimposed on chronic kidney disease ([place]) 04/24/2018  . Insulin dependent diabetes mellitus ([place]) 04/24/2018  . Hyperlipidemia 04/24/2018   PCP:  Reynold Bowen, MD Pharmacy:   [facility] [address] [place] - [place] [address] [place] [address] Phone: [phone] Fax: [phone]     Social  Determinants of Health (SDOH) Interventions    Readmission Risk Interventions No flowsheet data found.

## 2018-05-01 NOTE — Progress Notes (Signed)
Pt returned form  Cath lab , alert and oriented, made comfortable c/o dry mouth ice chips given.

## 2018-05-01 NOTE — Progress Notes (Signed)
Admit: 04/24/2018 LOS: 7  19M new ESRD, prev CKD4  Subjective:  . S/p L RC AVF yesterday . LHC this AM; no PCI, sig but not severe CAD, med mgmt . CLIP to [facility] THS 2nd shift . HD overnight: 1L UF  04/28 0701 - 04/29 0700 In: 374.2 [I.V.:274.2; IV Piggyback:100] Out: 1010 [Blood:10]  Filed Weights   04/29/18 1340 04/30/18 0407 05/01/18 0410  Weight: 80.4 kg 80.1 kg 81.2 kg    Scheduled Meds: . [MAR Hold] aspirin  81 mg Oral Daily  . [MAR Hold] Chlorhexidine Gluconate Cloth  6 each Topical Q0600  . [MAR Hold] darbepoetin (ARANESP) injection - DIALYSIS  25 mcg Intravenous Q Tue-HD  . [MAR Hold] insulin aspart  0-5 Units Subcutaneous QHS  . [MAR Hold] insulin aspart  0-9 Units Subcutaneous TID WC  . [MAR Hold] insulin aspart protamine- aspart  20 Units Subcutaneous Q breakfast  . [MAR Hold] isosorbide-hydrALAZINE  1 tablet Oral TID  . [MAR Hold] Living Better with Heart Failure Book   Does not apply Once  . [MAR Hold] metoprolol succinate  50 mg Oral Daily  . [MAR Hold] rosuvastatin  40 mg Oral Daily  . [MAR Hold] sodium chloride flush  3 mL Intravenous Q12H  . sodium chloride flush  3 mL Intravenous Q12H   Continuous Infusions: . [MAR Hold] sodium chloride 250 mL (04/30/18 1457)  . sodium chloride    . sodium chloride 10 mL/hr at 05/01/18 0635  . [MAR Hold] ferric gluconate (FERRLECIT/NULECIT) IV 125 mg (05/01/18 0246)   PRN Meds:.[MAR Hold] sodium chloride, sodium chloride, [MAR Hold] acetaminophen, [MAR Hold] hydrALAZINE, [MAR Hold] ondansetron (ZOFRAN) IV, [MAR Hold] sodium chloride flush, sodium chloride flush, [MAR Hold] zolpidem  Current Labs: reviewed  Results for Justin [name], Justin [name] (MRN [id number]) as of 04/30/2018 08:42  Ref. Range 04/29/2018 13:50  PTH, Intact Latest Ref Range: 15 - 65 pg/mL 99 (H)    Physical Exam:  Blood pressure (!) 105/49, pulse (!) 54, temperature 97.9 F (36.6 C), temperature source Oral, resp. rate (!) 21, height 5\' 9"  (1.753 m), weight 81.2  kg, SpO2 97 %. NAD, lyinig flat  RRR nl s1s2 CTAB trace LEE S/nt/nd nonfocal EOMI\ L RC AVF +T NCAT  A 1. AoCKD4, now ESRD; neg [place] during admission 2. S/p L RC AVF 4/28 VVS;  3. Nephrotic proteinuria 4. Aoc sCHF, some responsiveness to  Diuretics 5. HTN, urgent, improved 6. Anemia: TSAT 17% and Ferritin 139 7. CKD BMD: PTH acceptable; P 5.5, CTM 8. CAD s/p LHC 4/29, med mgmt rec  P . Should be able to DC this PM or tomorrow in AM prior to HD start time . CLIP to [place] THS 2nd shift completed; needs to arrive at 1045am first visit . Cont IV Fe; cont lowdose ESA started 4/28 26mcg aranesp . Medication Issues; o Preferred narcotic agents for pain control are hydromorphone, fentanyl, and methadone. Morphine should not be used.  o Baclofen should be avoided o Avoid oral sodium phosphate and magnesium citrate based laxatives /  bowel preps    [name] MD 05/01/2018, 8:39 AM  Recent Labs  Lab 04/28/18 0230 04/29/18 0239 04/29/18 1350 04/30/18 0234 05/01/18 0106  NA 140 141  --   --  140  K 3.7 3.7  --   --  4.5  CL 102 104  --   --  104  CO2 24 25  --   --  26  GLUCOSE 127* 117*  --   --  152*  BUN 76* 80*  --   --  61*  CREATININE 7.16* 7.25*  --   --  5.96*  CALCIUM 7.6* 7.7* 7.9*  --  7.9*  PHOS  --   --   --  4.9* 5.5*   Recent Labs  Lab 04/24/18 1433 04/25/18 0429  04/29/18 0239 04/30/18 0234 05/01/18 0106  WBC 8.4 9.9   < > 8.5 8.2 8.5  NEUTROABS 6.4 7.6  --   --   --   --   HGB 11.7* 10.5*   < > 10.1* 10.2* 10.1*  HCT 36.8* 32.5*   < > 31.0* 31.2* 31.3*  MCV 88.7 89.0   < > 86.8 86.7 88.4  PLT 230 222   < > 231 236 239   < > = values in this interval not displayed.

## 2018-05-01 NOTE — Progress Notes (Signed)
PROGRESS NOTE  Justin [name] NKN:[id number] DOB: 1950/12/29 DOA: 04/24/2018 PCP: Reynold Bowen, MD  Brief hospital course: Pt. with PMH of hypertension, CKD stage IVandDM 2 ; admitted on 04/24/2018, presented with complaint of shortnes of breath, was found to have acute on chronic systolic CHF. Currently further plan is continue IV diuresis. He is started on dialysis during this hospitalization    HPI/Recap of past 24 hours:  He is seen after returned from cardiac cath, he denies chest pain, no sob  Assessment/Plan: Principal Problem:   Acute congestive heart failure ([place]) Active Problems:   Acute respiratory distress   Hypertensive urgency   Acute renal failure superimposed on chronic kidney disease (HCC)   Insulin dependent diabetes mellitus (HCC)   Hyperlipidemia   CKD (chronic kidney disease)   Nonrheumatic aortic valve stenosis  Acute respiratory distress/acute hypoxic respiratory failure ( not home o2 dependent Acute systolic and diastolic CHF Elevated troponin Presents with complaints of shortness of breathand lower extremity swelling over the last week. Patient with 2+ pitting lower extremity edema and crackles on lung exam. BNP elevated at 1674.1 and chest x-ray showing interstitial edema. Patient with no prior history of heart disease. -Strict I&Os and daily weights -Elevate lower extremities -he was treated with Lasix 40 mg IV Bid now on hold, on dialysis now -Echocardiogram shows decreased EF.   - - s/p cardiac cath with multivessel diseases, medical management recommended, Appreciate cardiology assistance,  -he reports previously not taking asa, asa started, he is continued on toprolxl/crestor/bidil -Wean o2, ambulate  Hypertensive urgency:  On admission patient's blood pressure elevated 210/113. Patient had been given nitroglycerin and lisinoprilin the ED. -Hold lisinopril due to acute kidney injury -Hydralazine IV as needed -Nephrology  recommended starting isosorbide-hydralazine 20-37.5 mg 3 times daily  Acute renal failure superimposed on chronic kidney disease stage IV/new ESRD, started on dialysis this hospitalization : Anemia of chronic disease, he received iv iron in the hospital per nephrology recommendation  Previous noted to have creatinine around 3.1, but presents with creatinine 6.76 with BUN 32. Suspect acute rise could be related with hypoperfusion related with heart failure. - Appreciate nephrology consultative services - Tolerated HD treatment, repeat HD per nephrology. - s/p AV fistula placement as well as TDC placement .   - Appreciate vascular surgery consultation, AVF on 04/30/2018  Insulin-dependent diabetes mellitus:  -a1c 6.2 Patient normally takes Novolin 70/30 insulin 40 units every morning. -Hypoglycemic protocols -Reduced 70/30 insulin to 20 units daily while in hospital -CBGs q. before meals with sensitive sliding scale insulin -Adjust regimen as needed  Hyperlipidemia -Continue Crestor  Diet: Renal/carb modified diet DVT Prophylaxis: subcutaneous Heparin   Code Status: full  Family Communication: patient and girlfriend over the phone with permission  Disposition Plan: discharge tomorrow am , he needs to go to outpatient dialysis center at 11 am tomorrow [facility] [place] [facility], will try transitional pharmacy first  Consultants:  Nephrology  Vascular surgery  cardiology  Procedures: left radiocephalic fistula placement on 4/28 Cardiac cath on 4/29 , plan for medical management  Antibiotics:  Perioperative ancef    Objective: BP (!) 177/55 (BP Location: Left Leg)   Pulse (!) 0   Temp 97.9 F (36.6 C) (Axillary)   Resp 16   Ht 5\' 9"  (1.753 m)   Wt 81.2 kg   SpO2 (!) 0%   BMI 26.44 kg/m   Intake/Output Summary (Last 24 hours) at 05/01/2018 1135 Last data filed at 05/01/2018 3790 Gross per  24 hour  Intake 374.18 ml  Output 1010 ml  Net  -635.82 ml   Filed Weights   04/29/18 1340 04/30/18 0407 05/01/18 0410  Weight: 80.4 kg 80.1 kg 81.2 kg    Exam: Patient is examined daily including today on 05/01/2018, exams remain the same as of yesterday except that has changed    General:  NAD  Cardiovascular: RRR  Respiratory: CTABL  Abdomen: Soft/ND/NT, positive BS  Musculoskeletal: No Edema  Neuro: alert, oriented   Data Reviewed: Basic Metabolic Panel: Recent Labs  Lab 04/26/18 0317 04/27/18 0215 04/28/18 0230 04/29/18 0239 04/29/18 1350 04/30/18 0234 05/01/18 0106  NA 141 139 140 141  --   --  140  K 3.6 3.6 3.7 3.7  --   --  4.5  CL 106 103 102 104  --   --  104  CO2 21* 24 24 25   --   --  26  GLUCOSE 110* 103* 127* 117*  --   --  152*  BUN 60* 70* 76* 80*  --   --  61*  CREATININE 7.04* 7.12* 7.16* 7.25*  --   --  5.96*  CALCIUM 8.0* 7.6* 7.6* 7.7* 7.9*  --  7.9*  MG 1.9 2.0 2.1 2.2  --  2.2 2.1  PHOS  --   --   --   --   --  4.9* 5.5*   Liver Function Tests: Recent Labs  Lab 04/24/18 1433 05/01/18 0106  AST 25  --   ALT 26  --   ALKPHOS 86  --   BILITOT 0.4  --   PROT 5.5*  --   ALBUMIN 2.9* 2.4*   No results for input(s): LIPASE, AMYLASE in the last 168 hours. No results for input(s): AMMONIA in the last 168 hours. CBC: Recent Labs  Lab 04/24/18 1433 04/25/18 0429 04/27/18 0215 04/28/18 0230 04/29/18 0239 04/30/18 0234 05/01/18 0106  WBC 8.4 9.9 10.1 9.6 8.5 8.2 8.5  NEUTROABS 6.4 7.6  --   --   --   --   --   HGB 11.7* 10.5* 10.3* 10.4* 10.1* 10.2* 10.1*  HCT 36.8* 32.5* 32.1* 31.7* 31.0* 31.2* 31.3*  MCV 88.7 89.0 88.4 86.4 86.8 86.7 88.4  PLT 230 222 217 219 231 236 239   Cardiac Enzymes:   Recent Labs  Lab 04/24/18 1811 04/24/18 2240 04/25/18 0429  TROPONINI 0.05* 0.06* 0.07*   BNP (last 3 results) Recent Labs    04/24/18 1434  BNP 1,674.1*    ProBNP (last 3 results) No results for input(s): PROBNP in the last 8760 hours.  CBG: Recent Labs  Lab 04/30/18  1429 04/30/18 1455 04/30/18 1731 04/30/18 2104 05/01/18 0615  GLUCAP 62* 173* 130* 111* 127*    Recent Results (from the past 240 hour(s))  MRSA PCR Screening     Status: None   Collection Time: 04/24/18  5:43 PM  Result Value Ref Range Status   MRSA by PCR NEGATIVE NEGATIVE Final    Comment:        The GeneXpert MRSA Assay (FDA approved for NASAL specimens only), is one component of a comprehensive MRSA colonization surveillance program. It is not intended to diagnose MRSA infection nor to guide or monitor treatment for MRSA infections. Performed at [facility], [place] [address], [place], [place] [address]      Studies: No results found.  Scheduled Meds: . [START ON 05/02/2018] aspirin EC  81 mg Oral Daily  . Chlorhexidine  Gluconate Cloth  6 each Topical [id number]  . darbepoetin (ARANESP) injection - DIALYSIS  25 mcg Intravenous Q Tue-HD  . heparin  5,000 Units Subcutaneous Q8H  . insulin aspart  0-5 Units Subcutaneous QHS  . insulin aspart  0-9 Units Subcutaneous TID WC  . insulin aspart protamine- aspart  20 Units Subcutaneous Q breakfast  . isosorbide-hydrALAZINE  1 tablet Oral TID  . Living Better with Heart Failure Book   Does not apply Once  . metoprolol succinate  50 mg Oral Daily  . rosuvastatin  40 mg Oral Daily  . sodium chloride flush  3 mL Intravenous Q12H  . sodium chloride flush  3 mL Intravenous Q12H    Continuous Infusions: . sodium chloride 250 mL (04/30/18 1457)  . sodium chloride    . ferric gluconate (FERRLECIT/NULECIT) IV 125 mg (05/01/18 0246)     Time spent: 60mins, case discussed with nephrology and cardiology I have personally reviewed and interpreted on  05/01/2018 daily labs, tele strips, imagings as discussed above under date review session and assessment and plans.  I reviewed all nursing notes, pharmacy notes, consultant notes,  vitals, pertinent old records  I have discussed plan of care as described above with RN , patient  and family on 05/01/2018   Florencia Reasons MD, PhD  [facility] Pager [phone]. If 7PM-7AM, please contact night-coverage at [email or web address], password [facility] 05/01/2018, 11:35 AM  LOS: 7 days

## 2018-05-01 NOTE — Progress Notes (Signed)
Progress Note  Patient Name: Justin [name] Date of Encounter: 05/01/2018  Primary Cardiologist: Kirk Ruths, MD  New  Subjective   Cath done. No CP.   Inpatient Medications    Scheduled Meds:  [START ON 05/02/2018] aspirin EC  81 mg Oral Daily   Chlorhexidine Gluconate Cloth  6 each Topical Q0600   darbepoetin (ARANESP) injection - DIALYSIS  25 mcg Intravenous Q Tue-HD   heparin  5,000 Units Subcutaneous Q8H   insulin aspart  0-5 Units Subcutaneous QHS   insulin aspart  0-9 Units Subcutaneous TID WC   insulin aspart protamine- aspart  20 Units Subcutaneous Q breakfast   isosorbide-hydrALAZINE  1 tablet Oral TID   Living Better with Heart Failure Book   Does not apply Once   metoprolol succinate  50 mg Oral Daily   rosuvastatin  40 mg Oral Daily   sodium chloride flush  3 mL Intravenous Q12H   sodium chloride flush  3 mL Intravenous Q12H   Continuous Infusions:  sodium chloride 250 mL (04/30/18 1457)   sodium chloride     ferric gluconate (FERRLECIT/NULECIT) IV 125 mg (05/01/18 0246)   PRN Meds: sodium chloride, sodium chloride, acetaminophen, hydrALAZINE, hydrALAZINE, labetalol, ondansetron (ZOFRAN) IV, oxyCODONE, sodium chloride flush, sodium chloride flush, zolpidem   Vital Signs    Vitals:   05/01/18 0949 05/01/18 0952 05/01/18 0957 05/01/18 1011  BP: (!) 171/68 (!) 159/59  (!) 177/55  Pulse: (!) 58 (!) 57 (!) 0   Resp: 11 (!) 9 16   Temp:    97.9 F (36.6 C)  TempSrc:    Axillary  SpO2: 97% 96% (!) 0%   Weight:      Height:        Intake/Output Summary (Last 24 hours) at 05/01/2018 1043 Last data filed at 05/01/2018 0635 Gross per 24 hour  Intake 374.18 ml  Output 1010 ml  Net -635.82 ml   Filed Weights   04/29/18 1340 04/30/18 0407 05/01/18 0410  Weight: 80.4 kg 80.1 kg 81.2 kg    Physical Exam   GEN: Well nourished, well developed, in no acute distress.  HEENT: Grossly normal.  Neck: Supple, no JVD, carotid bruits, or  masses. HD cath Cardiac: RRR, no murmurs, rubs, or gallops. No clubbing, cyanosis, edema. Cath site normal.  Respiratory:  Respirations regular and unlabored, clear to auscultation bilaterally. GI: Soft, nontender, nondistended, BS + x 4. MS: no deformity or atrophy. Skin: warm and dry, no rash. Neuro:  Strength and sensation are intact. Psych: AAOx3.  Normal affect.  Labs    Chemistry Recent Labs  Lab 04/24/18 1433  04/28/18 0230 04/29/18 0239 04/29/18 1350 05/01/18 0106  NA 143   < > 140 141  --  140  K 3.7   < > 3.7 3.7  --  4.5  CL 113*   < > 102 104  --  104  CO2 21*   < > 24 25  --  26  GLUCOSE 86   < > 127* 117*  --  152*  BUN 52*   < > 76* 80*  --  61*  CREATININE 6.76*   < > 7.16* 7.25*  --  5.96*  CALCIUM 7.7*   < > 7.6* 7.7* 7.9* 7.9*  PROT 5.5*  --   --   --   --   --   ALBUMIN 2.9*  --   --   --   --  2.4*  AST 25  --   --   --   --   --  ALT 26  --   --   --   --   --   ALKPHOS 86  --   --   --   --   --   BILITOT 0.4  --   --   --   --   --   GFRNONAA 8*   < > 7* 7*  --  9*  GFRAA 9*   < > 8* 8*  --  10*  ANIONGAP 9   < > 14 12  --  10   < > = values in this interval not displayed.     Hematology Recent Labs  Lab 04/29/18 0239 04/30/18 0234 05/01/18 0106  WBC 8.5 8.2 8.5  RBC 3.57* 3.60* 3.54*  HGB 10.1* 10.2* 10.1*  HCT 31.0* 31.2* 31.3*  MCV 86.8 86.7 88.4  MCH 28.3 28.3 28.5  MCHC 32.6 32.7 32.3  RDW 13.7 13.7 13.8  PLT 231 236 239    Cardiac Enzymes Recent Labs  Lab 04/24/18 1811 04/24/18 2240 04/25/18 0429  TROPONINI 0.05* 0.06* 0.07*    Recent Labs  Lab 04/24/18 1455  TROPIPOC 0.05     BNP Recent Labs  Lab 04/24/18 1434  BNP 1,674.1*     DDimer No results for input(s): DDIMER in the last 168 hours.   Radiology    Ir Cyndy Freeze Guide Cv Line Right  Result Date: 04/29/2018 INDICATION: 68 year old with end-stage renal disease and starting dialysis. Patient needs dialysis catheter. EXAM: FLUOROSCOPIC AND ULTRASOUND  GUIDED PLACEMENT OF A TUNNELED DIALYSIS CATHETER Physician: Stephan Minister. Anselm Pancoast, MD MEDICATIONS: Ancef 2 g; The antibiotic was administered within an appropriate time interval prior to skin puncture. ANESTHESIA/SEDATION: Versed 1.0 mg IV; Fentanyl 25 mcg IV; Moderate Sedation Time:  26 The patient was continuously monitored during the procedure by the interventional radiology nurse under my direct supervision. FLUOROSCOPY TIME:  Fluoroscopy Time: 36 seconds, 4 mGy COMPLICATIONS: None immediate. PROCEDURE: The procedure was explained to the patient. The risks and benefits of the procedure were discussed and the patient's questions were addressed. Informed consent was obtained from the patient. The patient was placed supine on the interventional table. Ultrasound confirmed a patent right internal jugular vein. Ultrasound images were obtained for documentation. The right side of the neck and chest was prepped and draped in a sterile fashion. The right neck was anesthetized with 1% lidocaine. Maximal barrier sterile technique was utilized including caps, mask, sterile gowns, sterile gloves, sterile drape, hand hygiene and skin antiseptic. A small incision was made with #11 blade scalpel. A 21 gauge needle directed into the right internal jugular vein with ultrasound guidance. A micropuncture dilator set was placed. A 23 cm tip to cuff Palindrome catheter was selected. The skin below the right clavicle was anesthetized and a small incision was made with an #11 blade scalpel. A subcutaneous tunnel was formed to the vein dermatotomy site. The catheter was brought through the tunnel. The vein dermatotomy site was dilated to accommodate a peel-away sheath. The catheter was placed through the peel-away sheath and directed into the central venous structures. The tip of the catheter was placed at the SVC/right atrium junction with fluoroscopy. Fluoroscopic images were obtained for documentation. Both lumens were found to aspirate and  flush well. The proper amount of heparin was flushed in both lumens. The vein dermatotomy site was closed using a single layer of absorbable suture and Dermabond. Gel-Foam was placed within the subcutaneous tract. The catheter was secured to the skin using Prolene suture.  IMPRESSION: Successful placement of a right jugular tunneled dialysis catheter using ultrasound and fluoroscopic guidance. Electronically Signed   By: Markus Daft M.D.   On: 04/29/2018 13:50   Ir US Guide Vasc Access Right  Result Date: 04/29/2018 INDICATION: 68 year old with end-stage renal disease and starting dialysis. Patient needs dialysis catheter. EXAM: FLUOROSCOPIC AND ULTRASOUND GUIDED PLACEMENT OF A TUNNELED DIALYSIS CATHETER Physician: Stephan Minister. Anselm Pancoast, MD MEDICATIONS: Ancef 2 g; The antibiotic was administered within an appropriate time interval prior to skin puncture. ANESTHESIA/SEDATION: Versed 1.0 mg IV; Fentanyl 25 mcg IV; Moderate Sedation Time:  26 The patient was continuously monitored during the procedure by the interventional radiology nurse under my direct supervision. FLUOROSCOPY TIME:  Fluoroscopy Time: 36 seconds, 4 mGy COMPLICATIONS: None immediate. PROCEDURE: The procedure was explained to the patient. The risks and benefits of the procedure were discussed and the patient's questions were addressed. Informed consent was obtained from the patient. The patient was placed supine on the interventional table. Ultrasound confirmed a patent right internal jugular vein. Ultrasound images were obtained for documentation. The right side of the neck and chest was prepped and draped in a sterile fashion. The right neck was anesthetized with 1% lidocaine. Maximal barrier sterile technique was utilized including caps, mask, sterile gowns, sterile gloves, sterile drape, hand hygiene and skin antiseptic. A small incision was made with #11 blade scalpel. A 21 gauge needle directed into the right internal jugular vein with ultrasound  guidance. A micropuncture dilator set was placed. A 23 cm tip to cuff Palindrome catheter was selected. The skin below the right clavicle was anesthetized and a small incision was made with an #11 blade scalpel. A subcutaneous tunnel was formed to the vein dermatotomy site. The catheter was brought through the tunnel. The vein dermatotomy site was dilated to accommodate a peel-away sheath. The catheter was placed through the peel-away sheath and directed into the central venous structures. The tip of the catheter was placed at the SVC/right atrium junction with fluoroscopy. Fluoroscopic images were obtained for documentation. Both lumens were found to aspirate and flush well. The proper amount of heparin was flushed in both lumens. The vein dermatotomy site was closed using a single layer of absorbable suture and Dermabond. Gel-Foam was placed within the subcutaneous tract. The catheter was secured to the skin using Prolene suture. IMPRESSION: Successful placement of a right jugular tunneled dialysis catheter using ultrasound and fluoroscopic guidance. Electronically Signed   By: Markus Daft M.D.   On: 04/29/2018 13:50   Telemetry    NSR - Personally Reviewed  ECG    NSR, ST depression prior - Personally Reviewed  Cardiac Studies   Echocardiogram 04/25/2018: 1. The left ventricle has moderately reduced systolic function, with an ejection fraction of 35-40%. The cavity size was moderately dilated. Left ventricular diastolic parameters were normal.  2. Inferior and septal hypokinesis.  3. The right ventricle has normal systolc function. The cavity was mildly enlarged. There is no increase in right ventricular wall thickness.  4. Left atrial size was moderately dilated.  5. Small pericardial effusion.  6. Small non circumferential pericardial effusion no tamponade.  7. Mild thickening of the mitral valve leaflet.  8. The aortic valve is tricuspid Moderate thickening of the aortic valve Moderate  calcification of the aortic valve. Moderate stenosis of the aortic valve.  9. Likely tri leaflet given degree of LV dysfunction would grade AS as moderate. 10. The aortic root is normal in size and structure. 11. The inferior vena  cava was dilated in size with <50% respiratory variability.  Cardiac catheterization 05/01/2018:  Right and left heart cath via right femoral approach using ultrasound guidance for single anterior wall sticks of the artery and vein.  Minimal gradient across the aortic valve, approximately 10 mmHg.  No significant aortic stenosis is felt to be present.  Widely patent proximal coronary arteries including the left main.  Mid LAD 50%, apical LAD 75 to 80%, first diagonal ostial to proximal 60 to 70%, mid RCA 50 to 60%, and proximal PDA 90%.  Coronary disease should be treated with medical therapy that includes aggressive risk factor modification: LDL less than 70 and preferably 55, hemoglobin A1c less than 7, and blood pressure 130/80 mmHg or less.  Daily aspirin should also be used unless contraindications.  Patient Profile     68 y.o. male with EF 35 to 40% here with acute combined systolic and diastolic heart failure with ischemic cardiomyopathy.  Assessment & Plan    1. Acute systolic and diastolic heart failure: -Cardiac catheterization today showed patent proximal coronary arteries>>mLAD 50%, apical LAD 75-80%, 1st Diag ostial to proximal 60-70%, mRCA 50-60% and pPDA 90% with recommendations for medical therapy with aggressive risk factor modification -Will need daily ASA 81 -HD catheter placed/ AVfistula 04/30/2018  -Continue Toprol 50 QD -Bidil 20-37.5 TID, statin high intensity -No ACE-I secondary to renal dysfunction   2. Elevated troponin: -Has underlying CAD as above per cath today  -Trop, 0.05>0.06>0.07 -Daily ASA 81 -HD catheter placed 04/30/2018  -Continue Toprol 50 QD -Bidil 20-37.5 TID, statin  -No ACE-I secondary to renal dysfunction    3. Diabetes with hypertension. -Per primary team -Blood pressure elevated>177/55>159/59>171/68 -Continue current regimen  -Consider adding amlodipine 5-10mg  QD  -HR continues to be in the upper 50's   4. Aortic stenosis: -Moderate per echocardiogram - mild per cath -Right and left cardiac cath today with preliminary results per Dr. Tamala Julian note  5. Stage IV-V chronic kidney disease: -Per nephrology   -HD catheter in place. AVFistula   Signed, Kathyrn Drown NP-C [place] Pager: [phone] 05/01/2018, 10:43 AM     For questions or updates, please contact   Please consult [email or web address] for contact info under Cardiology/STEMI.  Personally seen and examined. Agree with above.   Cath reviewed:  Obstructive coronary artery disease with absence of proximal severe CAD.  Mid LAD 50 to 60%, apical LAD 90%, first diagonal 75%.  Proximal circumflex 75% giving origin to 2 very small obtuse marginals, the first of which is totally occluded.  Large dominant ramus intermedius supplying much of the territory of the circumflex.  Dominant RCA with mid vessel 50% stenosis and proximal to mid 95% PDA.  Mild pulmonary hypertension.  Minimal aortic stenosis with approximately 9 mm peak to peak transvalvular gradient.  Upper normal LVEDP, 18 mmHg.  RECOMMENDATIONS:   Aggressive secondary risk factor modification.  LDL less than 70, hemoglobin A1c less than 7, and blood pressure 130/80 mmHg or less.  81 mg aspirin daily unless contraindicated.  Anti-ischemic therapy as needed for chest discomfort.  Appreciate Dr. Tamala Julian assistance. Changes made to note above.   Continue with aggressive secondary risk factor prevention. No PCI. HD continued. BP may decrease with ongoing HD.   OK to DC from cardiology standpoint later today, post cath.  Will set up follow up.  Candee Furbish, MD

## 2018-05-01 NOTE — CV Procedure (Signed)
   Right and left heart cath via right femoral approach using ultrasound guidance for single anterior wall sticks of the artery and vein.  Minimal gradient across the aortic valve, approximately 10 mmHg.  No significant aortic stenosis is felt to be present.  Widely patent proximal coronary arteries including the left main.  Mid LAD 50%, apical LAD 75 to 80%, first diagonal ostial to proximal 60 to 70%, mid RCA 50 to 60%, and proximal PDA 90%.  Coronary disease should be treated with medical therapy that includes aggressive risk factor modification: LDL less than 70 and preferably 55, hemoglobin A1c less than 7, and blood pressure 130/80 mmHg or less.  Daily aspirin should also be used unless contraindications.

## 2018-05-01 NOTE — Progress Notes (Signed)
SATURATION QUALIFICATIONS: (This note is used to comply with regulatory documentation for home oxygen)  Patient Saturations on Room Air at Rest = 95%  Patient Saturations on Room Air while Ambulating = 89%  Patient Saturations on 2 Liters of oxygen while Ambulating = 98%  Please briefly explain why patient needs home oxygen: Pt has no sob on ambulating  spo2 varied from 89-93 while walking . No complaints made .

## 2018-05-01 NOTE — Progress Notes (Signed)
Patient ID: Justin [name], male   DOB: [birth year], 68 y.o.   MRN: [id number] Patient status post left radiocephalic fistula creation yesterday with Dr. Oneida Alar.  Has mild soreness associated with this.  Easily palpable dilated vein and thrill present.  Not follow actively.  We will see in the office in 4 to 6 weeks.

## 2018-05-01 NOTE — Telephone Encounter (Deleted)
Left message for patient to call and schedule ABI's and 6 week telehealth visit with Dr. Debara Pickett

## 2018-05-02 DIAGNOSIS — Z23 Encounter for immunization: Secondary | ICD-10-CM | POA: Diagnosis not present

## 2018-05-02 DIAGNOSIS — Z992 Dependence on renal dialysis: Secondary | ICD-10-CM | POA: Diagnosis not present

## 2018-05-02 DIAGNOSIS — E441 Mild protein-calorie malnutrition: Secondary | ICD-10-CM | POA: Insufficient documentation

## 2018-05-02 DIAGNOSIS — I161 Hypertensive emergency: Secondary | ICD-10-CM

## 2018-05-02 DIAGNOSIS — N186 End stage renal disease: Secondary | ICD-10-CM | POA: Diagnosis not present

## 2018-05-02 DIAGNOSIS — J9601 Acute respiratory failure with hypoxia: Secondary | ICD-10-CM

## 2018-05-02 DIAGNOSIS — N2581 Secondary hyperparathyroidism of renal origin: Secondary | ICD-10-CM | POA: Diagnosis not present

## 2018-05-02 DIAGNOSIS — E1129 Type 2 diabetes mellitus with other diabetic kidney complication: Secondary | ICD-10-CM | POA: Diagnosis not present

## 2018-05-02 HISTORY — DX: Mild protein-calorie malnutrition: E44.1

## 2018-05-02 LAB — BASIC METABOLIC PANEL
Anion gap: 9 (ref 5–15)
BUN: 50 mg/dL — ABNORMAL HIGH (ref 8–23)
CO2: 25 mmol/L (ref 22–32)
Calcium: 8 mg/dL — ABNORMAL LOW (ref 8.9–10.3)
Chloride: 102 mmol/L (ref 98–111)
Creatinine, Ser: 5.78 mg/dL — ABNORMAL HIGH (ref 0.61–1.24)
GFR calc Af Amer: 11 mL/min — ABNORMAL LOW (ref >60)
GFR calc non Af Amer: 9 mL/min — ABNORMAL LOW (ref >60)
Glucose, Bld: 123 mg/dL — ABNORMAL HIGH (ref 70–99)
Potassium: 3.9 mmol/L (ref 3.5–5.1)
Sodium: 136 mmol/L (ref 135–145)

## 2018-05-02 LAB — GLUCOSE, CAPILLARY: Glucose-Capillary: 120 mg/dL — ABNORMAL HIGH (ref 70–99)

## 2018-05-02 MED ORDER — METOPROLOL SUCCINATE ER 50 MG PO TB24: 50.0000 mg | ORAL_TABLET | Freq: Every day | ORAL | 0 refills | Status: DC

## 2018-05-02 MED ORDER — NOVOLIN 70/30 RELION (70-30) 100 UNIT/ML ~~LOC~~ SUSP: 20.0000 [IU] | Freq: Every day | SUBCUTANEOUS | 0 refills | Status: DC

## 2018-05-02 MED ORDER — ISOSORB DINITRATE-HYDRALAZINE 20-37.5 MG PO TABS: 1.0000 | ORAL_TABLET | Freq: Three times a day (TID) | ORAL | 0 refills | Status: DC

## 2018-05-02 MED ORDER — ISOSORBIDE MONONITRATE ER 30 MG PO TB24: 30.0000 mg | ORAL_TABLET | Freq: Every day | ORAL | Status: DC

## 2018-05-02 MED ORDER — HYDRALAZINE HCL 25 MG PO TABS
37.5000 mg | ORAL_TABLET | Freq: Three times a day (TID) | ORAL | Status: DC
  Filled 2018-05-02: qty 1.5

## 2018-05-02 MED ORDER — ROSUVASTATIN CALCIUM 40 MG PO TABS: 40.0000 mg | ORAL_TABLET | Freq: Every day | ORAL | 0 refills | Status: DC

## 2018-05-02 MED ORDER — ISOSORBIDE MONONITRATE ER 30 MG PO TB24: 30.0000 mg | ORAL_TABLET | Freq: Every day | ORAL | 0 refills | Status: DC

## 2018-05-02 MED ORDER — HYDRALAZINE HCL 25 MG PO TABS: 37.5000 mg | ORAL_TABLET | Freq: Three times a day (TID) | ORAL | 0 refills | Status: DC

## 2018-05-02 MED ORDER — ASPIRIN 81 MG PO TBEC: 81.0000 mg | DELAYED_RELEASE_TABLET | Freq: Every day | ORAL | 0 refills | Status: DC

## 2018-05-02 MED FILL — METOPROLOL SUCCINATE ER 50: 50 | 30 days supply | Qty: 30 | Fill #0

## 2018-05-02 MED FILL — hydrALAZINE HCL 25 MG TABS: 25 | 30 days supply | Qty: 135 | Fill #0

## 2018-05-02 MED FILL — ASPIRIN LOW DOSE 81 MG TBEC: 81 | 30 days supply | Qty: 30 | Fill #0

## 2018-05-02 MED FILL — ISOSORBIDE MN ER 30 MG TAB: 30 | 30 days supply | Qty: 30 | Fill #0

## 2018-05-02 MED FILL — ROSUVASTATIN CALCIUM 40 MG: 40 | 30 days supply | Qty: 30 | Fill #0

## 2018-05-02 NOTE — Progress Notes (Signed)
Pt  Discharged left with meds, taken in wheelchair by nurse tech.

## 2018-05-02 NOTE — Discharge Summary (Signed)
Discharge Summary  Justin [name] MLY:[id number] DOB: Jun 15, 1950  PCP: Reynold Bowen, MD  Admit date: 04/24/2018 Discharge date: 05/02/2018  Time spent: 41mins, more than 50% time spent on coordination of care.  Recommendations for Outpatient Follow-up:  1. F/u with PCP within a week  for hospital discharge follow up, repeat cbc/bmp at follow up 2. F/u with cardiology 3. F/u with nephrology 4. F/u with vascular surgery  Discharge Diagnoses:  Active Hospital Problems   Diagnosis Date Noted   Acute congestive heart failure ([place]) 04/24/2018   CKD (chronic kidney disease)    Nonrheumatic aortic valve stenosis    ESRD (end stage renal disease) (H[facility])    Coronary artery disease involving native coronary artery of native heart without angina pectoris    Acute systolic CHF (congestive heart failure) ([place])    Acute respiratory distress 04/24/2018   Hypertensive urgency 04/24/2018   Acute renal failure superimposed on chronic kidney disease ([place]) 04/24/2018   Insulin dependent diabetes mellitus ([place]) 04/24/2018   Hyperlipidemia 04/24/2018    Resolved Hospital Problems  No resolved problems to display.    Discharge Condition: stable  Diet recommendation: heart healthy/carb modified/renal diet  Filed Weights   04/30/18 0407 05/01/18 0410 05/02/18 0445  Weight: 80.1 kg 81.2 kg 80.3 kg    History of present illness: (per admitting MD Dr Tamala Julian) PCP: Reynold Bowen, MD  Patient coming from: PCP of  Chief Complaint: Shortness of breath  I have personally briefly reviewed patient's old medical records in [place]   HPI: Justin [name] is a 68 y.o. male with medical history significant of HTN, CKD stage IV, insulin-dependent diabetes mellitus type 2; who presents with complaints of progressively worsening shortness of breath over the last week.  He reports that his weight is been stable, but in the last 2 days he is noticed his lower legs have been swelling.   Endorses shortness of breath worsening with exertion or trying to lie flat.  Patient is followed by nephrology, but does not know the name.  Blood pressure medications include lisinopril.  He has not been on any diuretics.  Other associated symptoms include complaints of chest discomfort worsened with activity.  Works as a Psychologist, sport and exercise.  Denies change in his weight, nausea, vomiting, diarrhea, fever, chills, history of heart failure, or any known recent sick contacts.  Patient notes previously being told about his kidney function for home he has been seen by nephrologist, but cannot recall their name.    ED Course: While in the emergency department patient was noted to have blood pressures elevated to 214/99 with tachypnea.  He was placed on 4 L nasal cannula oxygen you with some improvement respiratory distress.  Given nitroglycerin, lisinopril 10 mg, and hydralazine IV with little improvement in pressures.  Chest x-ray showing interstitial edema and pleural effusions consistent with congestive heart failure.  BNP elevated at 1674.1, creatinine 6.76(baseline previously 3.1).  TRH called to admit.   Hospital Course:  Principal Problem:   Acute congestive heart failure (HCC) Active Problems:   Acute respiratory distress   Hypertensive urgency   Acute renal failure superimposed on chronic kidney disease (HCC)   Insulin dependent diabetes mellitus (HCC)   Hyperlipidemia   CKD (chronic kidney disease)   Nonrheumatic aortic valve stenosis   ESRD (end stage renal disease) (HCC)   Coronary artery disease involving native coronary artery of native heart without angina pectoris   Acute systolic CHF (congestive heart failure) ([place])   Acute respiratory distress/acute  hypoxic respiratory failure ( not home o2 dependent) Acute systolicand diastolicCHF Elevated troponin -Presents with complaints of shortness of breathand lower extremity swelling over the last week. Patient with 2+ pitting lower extremity  edema and crackles on lung exam. BNP elevated at 1674.1 and chest x-ray showing interstitial edema. -Patient denies prior history of heart disease. -he was treated with Lasix 40 mg IV Bidnow on hold, on dialysis now -Echocardiogram shows decreased EF (35-40%) --s/p cardiac cath with multivessel diseases, medical management recommended, Appreciate cardiology assistance,  -he reports previously not taking asa, asa started, he is continued on toprolxl/crestor/bidil -bidil is too expensive, prescription changed to imdur and hydralazine -Weaned off o2, ambulate Cleared by cardiology to discharge with close outpatient cardiology follow up.  Hypertensive urgency:  On admission patient's blood pressure elevated 210/113. Patient had been given nitroglycerin and lisinoprilin the ED. - lisinopril discontinued due to acute kidney injury -bp much improved, expect continued improvement with dialysis -Nephrology input appreciated, he is discharged on imdur/hydralazine/betablocker   Acute renal failure superimposed on chronic kidney disease stageIV/new ESRD, started on dialysis this hospitalization : Anemia of chronic disease, he received iv iron in the hospital per nephrology recommendation  Previous noted to have creatinine around 3.1, but presents with creatinine 6.76 with BUN 32. Suspect acute rise could be related with hypoperfusion related with heart failure. - s/p AV fistula placement as well as TDC placement .he is started on dialysis this hospitalization, Appreciate nephrology consultative services - Tolerated HD treatment, outpatient dialysis sent up at [facility] dialysis center on [facility]. - Appreciate vascular surgery assistance  Insulin-dependent diabetes mellitus: -a1c 6.2 Patient normally takes Novolin 70/30 insulin 40 units every morning. -Reduced 70/30 insulin to 20 units daily while in hospital -CBGs q. before meals with sensitive sliding scale insulin -Adjust  regimen as needed  Hyperlipidemia -Continue Crestor  Diet: Renal/carb modified diet DVT Prophylaxis while in the hospital:subcutaneous Heparin   Code Status: full  Family Communication: patient and girlfriend over the phone with permission  Disposition Plan:  discharge today, he needs to go to outpatient dialysis center at 11 am today meds delivered to him  To bedside, appreciate  transitional pharmacy assistance  Consultants:  Nephrology  Vascular surgery  cardiology  Procedures: left radiocephalic fistula placement on 4/28 Cardiac cath on 4/29 , plan for medical management  Antibiotics:  Perioperative ancef     Discharge Exam: BP (!) 153/73 (BP Location: Right Arm)    Pulse 60    Temp 98 F (36.7 C) (Oral)    Resp 18    Ht 5\' 9"  (1.753 m)    Wt 80.3 kg    SpO2 94%    BMI 26.14 kg/m   General: NAD Cardiovascular: RRR Respiratory: diminished at basis, no wheezing, no rales, no rhonchi  Discharge Instructions You were cared for by a hospitalist during your hospital stay. If you have any questions about your discharge medications or the care you received while you were in the hospital after you are discharged, you can call the unit and asked to speak with the hospitalist on call if the hospitalist that took care of you is not available. Once you are discharged, your primary care physician will handle any further medical issues. Please note that NO REFILLS for any discharge medications will be authorized once you are discharged, as it is imperative that you return to your primary care physician (or establish a relationship with a primary care physician if you do not have one) for your  aftercare needs so that they can reassess your need for medications and monitor your lab values.  Discharge Instructions    Diet - low sodium heart healthy   Complete by:  As directed    Renal /carb modified diet   Increase activity slowly   Complete by:  As directed       Allergies as of 05/02/2018   No Known Allergies     Medication List    STOP taking these medications   lisinopril 5 MG tablet Commonly known as:  ZESTRIL     TAKE these medications   aspirin 81 MG EC tablet Take 1 tablet (81 mg total) by mouth daily. Start taking on:  May 03, 2018   hydrALAZINE 25 MG tablet Commonly known as:  APRESOLINE Take 1.5 tablets (37.5 mg total) by mouth 3 (three) times daily.   isosorbide mononitrate 30 MG 24 hr tablet Commonly known as:  IMDUR Take 1 tablet (30 mg total) by mouth daily.   metoprolol succinate 50 MG 24 hr tablet Commonly known as:  TOPROL-XL Take 1 tablet (50 mg total) by mouth daily. Take with or immediately following a meal. Start taking on:  May 03, 2018   NovoLIN 70/30 ReliOn (70-30) 100 UNIT/ML injection Generic drug:  insulin NPH-regular Human Inject 20 Units into the skin daily. What changed:  how much to take   rosuvastatin 40 MG tablet Commonly known as:  CRESTOR Take 1 tablet (40 mg total) by mouth daily. Start taking on:  May 03, 2018 What changed:    medication strength  how much to take      No Known Allergies Follow-up Information    Elam Dutch, MD Follow up in 5 week(s).   Specialties:  Vascular Surgery, Cardiology Contact information: [place] [address] [phone]        Reynold Bowen, MD Follow up.   Specialty:  Endocrinology Contact information: [place] [address] [phone]        Lelon Perla, MD Follow up in 4 week(s).   Specialty:  Cardiology Why:  for coronary artery disease  Contact information: [address] [place] [address] [phone]        Rexene Agent, MD Follow up.   Specialty:  Nephrology Contact information: [place] [address] [phone]            The results of significant diagnostics from this hospitalization (including imaging, microbiology, ancillary and  laboratory) are listed below for reference.    Significant Diagnostic Studies: US Renal  Result Date: 04/25/2018 CLINICAL DATA:  Acute renal failure EXAM: RENAL / URINARY TRACT ULTRASOUND COMPLETE COMPARISON:  None. FINDINGS: Right Kidney: Renal measurements: 8.9 x 4.8 x 4.9 cm = volume: 111 mL. Increased echotexture and cortical thinning. No mass or hydronephrosis. Left Kidney: Renal measurements: 10.8 x 5.1 x 5.8 cm = volume: 173 mL. Increased echotexture. No mass or hydronephrosis. Bladder: Decompressed with Foley catheter in place. Incidentally noted are bilateral pleural effusions. IMPRESSION: Increased echotexture in the kidneys bilaterally compatible with chronic medical renal disease. No acute findings or hydronephrosis. Electronically Signed   By: Rolm Baptise M.D.   On: 04/25/2018 00:39   Ir Fluoro Guide Cv Line Right  Result Date: 04/29/2018 INDICATION: 68 year old with end-stage renal disease and starting dialysis. Patient needs dialysis catheter. EXAM: FLUOROSCOPIC AND ULTRASOUND GUIDED PLACEMENT OF A TUNNELED DIALYSIS CATHETER Physician: Stephan Minister. Anselm Pancoast, MD MEDICATIONS: Ancef 2 g; The antibiotic was  administered within an appropriate time interval prior to skin puncture. ANESTHESIA/SEDATION: Versed 1.0 mg IV; Fentanyl 25 mcg IV; Moderate Sedation Time:  26 The patient was continuously monitored during the procedure by the interventional radiology nurse under my direct supervision. FLUOROSCOPY TIME:  Fluoroscopy Time: 36 seconds, 4 mGy COMPLICATIONS: None immediate. PROCEDURE: The procedure was explained to the patient. The risks and benefits of the procedure were discussed and the patient's questions were addressed. Informed consent was obtained from the patient. The patient was placed supine on the interventional table. Ultrasound confirmed a patent right internal jugular vein. Ultrasound images were obtained for documentation. The right side of the neck and chest was prepped and draped in a  sterile fashion. The right neck was anesthetized with 1% lidocaine. Maximal barrier sterile technique was utilized including caps, mask, sterile gowns, sterile gloves, sterile drape, hand hygiene and skin antiseptic. A small incision was made with #11 blade scalpel. A 21 gauge needle directed into the right internal jugular vein with ultrasound guidance. A micropuncture dilator set was placed. A 23 cm tip to cuff Palindrome catheter was selected. The skin below the right clavicle was anesthetized and a small incision was made with an #11 blade scalpel. A subcutaneous tunnel was formed to the vein dermatotomy site. The catheter was brought through the tunnel. The vein dermatotomy site was dilated to accommodate a peel-away sheath. The catheter was placed through the peel-away sheath and directed into the central venous structures. The tip of the catheter was placed at the SVC/right atrium junction with fluoroscopy. Fluoroscopic images were obtained for documentation. Both lumens were found to aspirate and flush well. The proper amount of heparin was flushed in both lumens. The vein dermatotomy site was closed using a single layer of absorbable suture and Dermabond. Gel-Foam was placed within the subcutaneous tract. The catheter was secured to the skin using Prolene suture. IMPRESSION: Successful placement of a right jugular tunneled dialysis catheter using ultrasound and fluoroscopic guidance. Electronically Signed   By: Markus Daft M.D.   On: 04/29/2018 13:50   Ir US Guide Vasc Access Right  Result Date: 04/29/2018 INDICATION: 68 year old with end-stage renal disease and starting dialysis. Patient needs dialysis catheter. EXAM: FLUOROSCOPIC AND ULTRASOUND GUIDED PLACEMENT OF A TUNNELED DIALYSIS CATHETER Physician: Stephan Minister. Anselm Pancoast, MD MEDICATIONS: Ancef 2 g; The antibiotic was administered within an appropriate time interval prior to skin puncture. ANESTHESIA/SEDATION: Versed 1.0 mg IV; Fentanyl 25 mcg IV; Moderate  Sedation Time:  26 The patient was continuously monitored during the procedure by the interventional radiology nurse under my direct supervision. FLUOROSCOPY TIME:  Fluoroscopy Time: 36 seconds, 4 mGy COMPLICATIONS: None immediate. PROCEDURE: The procedure was explained to the patient. The risks and benefits of the procedure were discussed and the patient's questions were addressed. Informed consent was obtained from the patient. The patient was placed supine on the interventional table. Ultrasound confirmed a patent right internal jugular vein. Ultrasound images were obtained for documentation. The right side of the neck and chest was prepped and draped in a sterile fashion. The right neck was anesthetized with 1% lidocaine. Maximal barrier sterile technique was utilized including caps, mask, sterile gowns, sterile gloves, sterile drape, hand hygiene and skin antiseptic. A small incision was made with #11 blade scalpel. A 21 gauge needle directed into the right internal jugular vein with ultrasound guidance. A micropuncture dilator set was placed. A 23 cm tip to cuff Palindrome catheter was selected. The skin below the right clavicle was anesthetized and a  small incision was made with an #11 blade scalpel. A subcutaneous tunnel was formed to the vein dermatotomy site. The catheter was brought through the tunnel. The vein dermatotomy site was dilated to accommodate a peel-away sheath. The catheter was placed through the peel-away sheath and directed into the central venous structures. The tip of the catheter was placed at the SVC/right atrium junction with fluoroscopy. Fluoroscopic images were obtained for documentation. Both lumens were found to aspirate and flush well. The proper amount of heparin was flushed in both lumens. The vein dermatotomy site was closed using a single layer of absorbable suture and Dermabond. Gel-Foam was placed within the subcutaneous tract. The catheter was secured to the skin using  Prolene suture. IMPRESSION: Successful placement of a right jugular tunneled dialysis catheter using ultrasound and fluoroscopic guidance. Electronically Signed   By: Markus Daft M.D.   On: 04/29/2018 13:50   Dg Chest Port 1 View  Result Date: 04/24/2018 CLINICAL DATA:  Chest pain and shortness of breath. Bilateral lower extremity swelling. EXAM: PORTABLE CHEST 1 VIEW COMPARISON:  None. FINDINGS: Mild cardiomegaly. Atherosclerotic calcification of the aortic arch. Pulmonary vascular congestion and diffuse interstitial thickening. Small right greater than left pleural effusions and right greater than left basilar atelectasis. No pneumothorax. No acute osseous abnormality. IMPRESSION: 1. Cardiomegaly with mild interstitial pulmonary edema and small bilateral pleural effusions, consistent with congestive heart failure. 2.  Aortic atherosclerosis (ICD10-I70.0). Electronically Signed   By: Titus Dubin M.D.   On: 04/24/2018 14:57   Vas [place] Upper Ext Vein Mapping (pre-op Avf)  Result Date: 04/28/2018 UPPER EXTREMITY VEIN MAPPING  Indications: Pre-access. Performing Technologist: Abram Sander RVS  Examination Guidelines: A complete evaluation includes B-mode imaging, spectral Doppler, color Doppler, and power Doppler as needed of all accessible portions of each vessel. Bilateral testing is considered an integral part of a complete examination. Limited examinations for reoccurring indications may be performed as noted. +-----------------+-------------+----------+--------+  Right Cephalic    Diameter (cm) Depth (cm) Findings  +-----------------+-------------+----------+--------+  Shoulder              0.47         1.29              +-----------------+-------------+----------+--------+  Prox upper arm        0.45         0.89              +-----------------+-------------+----------+--------+  Mid upper arm         0.42         0.31              +-----------------+-------------+----------+--------+  Dist upper arm         0.43         0.37              +-----------------+-------------+----------+--------+  Antecubital fossa     0.47         0.30              +-----------------+-------------+----------+--------+  Prox forearm          0.36         0.38              +-----------------+-------------+----------+--------+  Mid forearm           0.30         0.16              +-----------------+-------------+----------+--------+  Dist forearm  0.29         0.21              +-----------------+-------------+----------+--------+  Wrist                 0.31         0.19              +-----------------+-------------+----------+--------+ +-----------------+-------------+----------+--------+  Right Basilic     Diameter (cm) Depth (cm) Findings  +-----------------+-------------+----------+--------+  Prox upper arm        0.62         1.67              +-----------------+-------------+----------+--------+  Mid upper arm         0.52         1.04              +-----------------+-------------+----------+--------+  Dist upper arm        0.46         0.39              +-----------------+-------------+----------+--------+  Antecubital fossa     0.29         0.34              +-----------------+-------------+----------+--------+  Prox forearm          0.22         0.16              +-----------------+-------------+----------+--------+  Mid forearm           0.23         0.15              +-----------------+-------------+----------+--------+  Distal forearm        0.20         0.17              +-----------------+-------------+----------+--------+ +-----------------+-------------+----------+--------+  Left Cephalic     Diameter (cm) Depth (cm) Findings  +-----------------+-------------+----------+--------+  Shoulder              0.49         1.46              +-----------------+-------------+----------+--------+  Prox upper arm        0.56         1.05              +-----------------+-------------+----------+--------+  Mid upper arm          0.48         0.56              +-----------------+-------------+----------+--------+  Dist upper arm        0.50         0.49              +-----------------+-------------+----------+--------+  Antecubital fossa     0.36         0.46              +-----------------+-------------+----------+--------+  Prox forearm          0.42         0.54              +-----------------+-------------+----------+--------+  Mid forearm           0.34         0.26              +-----------------+-------------+----------+--------+  Dist forearm  0.36         0.19              +-----------------+-------------+----------+--------+  Wrist                 0.39         0.83              +-----------------+-------------+----------+--------+ +-----------------+-------------+----------+--------+  Left Basilic      Diameter (cm) Depth (cm) Findings  +-----------------+-------------+----------+--------+  Prox upper arm        0.40         0.63              +-----------------+-------------+----------+--------+  Mid upper arm         0.40         0.58              +-----------------+-------------+----------+--------+  Dist upper arm        0.37         0.63              +-----------------+-------------+----------+--------+  Antecubital fossa     0.37         0.26              +-----------------+-------------+----------+--------+  Prox forearm          0.28         0.22              +-----------------+-------------+----------+--------+  Mid forearm           0.28         0.21              +-----------------+-------------+----------+--------+  Distal forearm        0.28         0.23              +-----------------+-------------+----------+--------+ *See table(s) above for measurements and observations.  Diagnosing physician: Monica Martinez MD Electronically signed by Monica Martinez MD on 04/28/2018 at 11:57:00 AM.    Final     Microbiology: Recent Results (from the past 240 hour(s))  MRSA PCR Screening     Status: None    Collection Time: 04/24/18  5:43 PM  Result Value Ref Range Status   MRSA by PCR NEGATIVE NEGATIVE Final    Comment:        The GeneXpert MRSA Assay (FDA approved for NASAL specimens only), is one component of a comprehensive MRSA colonization surveillance program. It is not intended to diagnose MRSA infection nor to guide or monitor treatment for MRSA infections. Performed at [facility], [place] [address] [place], [place] [address]      Labs: Basic Metabolic Panel: Recent Labs  Lab 04/27/18 0215 04/28/18 0230 04/29/18 0239 04/29/18 1350 04/30/18 0234 05/01/18 0106 05/01/18 0911 05/01/18 0922 05/02/18 0229  NA 139 140 141  --   --  140 139 136 136  K 3.6 3.7 3.7  --   --  4.5 4.0 3.9 3.9  CL 103 102 104  --   --  104  --   --  102  CO2 24 24 25   --   --  26  --   --  25  GLUCOSE 103* 127* 117*  --   --  152*  --   --  123*  BUN 70* 76* 80*  --   --  61*  --   --  50*  CREATININE 7.12*  7.16* 7.25*  --   --  5.96*  --   --  5.78*  CALCIUM 7.6* 7.6* 7.7* 7.9*  --  7.9*  --   --  8.0*  MG 2.0 2.1 2.2  --  2.2 2.1  --   --   --   PHOS  --   --   --   --  4.9* 5.5*  --   --   --    Liver Function Tests: Recent Labs  Lab 05/01/18 0106  ALBUMIN 2.4*   No results for input(s): LIPASE, AMYLASE in the last 168 hours. No results for input(s): AMMONIA in the last 168 hours. CBC: Recent Labs  Lab 04/27/18 0215 04/28/18 0230 04/29/18 0239 04/30/18 0234 05/01/18 0106 05/01/18 0911 05/01/18 0922  WBC 10.1 9.6 8.5 8.2 8.5  --   --   HGB 10.3* 10.4* 10.1* 10.2* 10.1* 10.2* 9.9*  HCT 32.1* 31.7* 31.0* 31.2* 31.3* 30.0* 29.0*  MCV 88.4 86.4 86.8 86.7 88.4  --   --   PLT 217 219 231 236 239  --   --    Cardiac Enzymes: No results for input(s): CKTOTAL, CKMB, CKMBINDEX, TROPONINI in the last 168 hours. BNP: BNP (last 3 results) Recent Labs    04/24/18 1434  BNP 1,674.1*    ProBNP (last 3 results) No results for input(s): PROBNP in the last 8760  hours.  CBG: Recent Labs  Lab 05/01/18 0615 05/01/18 1211 05/01/18 1624 05/01/18 2129 05/02/18 0640  GLUCAP 127* 114* 134* 134* 120*       Signed:  Florencia Reasons MD, PhD  [facility] 05/02/2018, 10:14 AM

## 2018-05-03 ENCOUNTER — Telehealth: Payer: Self-pay | Admitting: Vascular Surgery

## 2018-05-03 NOTE — Telephone Encounter (Signed)
Called @ 9:43 am---no answer--no voice mail.  Patient needs 2-4 week f/u with Dr. Stanford Breed per staff message from [place], [place]

## 2018-05-03 NOTE — Telephone Encounter (Signed)
-----   Message from Elam Dutch, MD sent at 04/30/2018  5:10 PM EDT ----- Left radial cephalic AVF Matt asst  Needs follow up with [place] in [place] clinic 4-6 weeks  Ruta Hinds

## 2018-05-03 NOTE — Telephone Encounter (Signed)
sch appt spk to pt [facility] ltr 06/06/2018 1pm Dialysis Duplex 145pm p/o NP

## 2018-05-04 DIAGNOSIS — N2581 Secondary hyperparathyroidism of renal origin: Secondary | ICD-10-CM | POA: Diagnosis not present

## 2018-05-04 DIAGNOSIS — N186 End stage renal disease: Secondary | ICD-10-CM | POA: Diagnosis not present

## 2018-05-07 DIAGNOSIS — N186 End stage renal disease: Secondary | ICD-10-CM | POA: Diagnosis not present

## 2018-05-07 DIAGNOSIS — I130 Hypertensive heart and chronic kidney disease with heart failure and stage 1 through stage 4 chronic kidney disease, or unspecified chronic kidney disease: Secondary | ICD-10-CM | POA: Diagnosis not present

## 2018-05-07 DIAGNOSIS — N2581 Secondary hyperparathyroidism of renal origin: Secondary | ICD-10-CM | POA: Diagnosis not present

## 2018-05-07 DIAGNOSIS — I5021 Acute systolic (congestive) heart failure: Secondary | ICD-10-CM | POA: Diagnosis not present

## 2018-05-07 DIAGNOSIS — E1129 Type 2 diabetes mellitus with other diabetic kidney complication: Secondary | ICD-10-CM | POA: Diagnosis not present

## 2018-05-07 DIAGNOSIS — I2510 Atherosclerotic heart disease of native coronary artery without angina pectoris: Secondary | ICD-10-CM | POA: Diagnosis not present

## 2018-05-07 DIAGNOSIS — E785 Hyperlipidemia, unspecified: Secondary | ICD-10-CM | POA: Diagnosis not present

## 2018-05-07 DIAGNOSIS — I350 Nonrheumatic aortic (valve) stenosis: Secondary | ICD-10-CM | POA: Diagnosis not present

## 2018-05-07 DIAGNOSIS — I359 Nonrheumatic aortic valve disorder, unspecified: Secondary | ICD-10-CM | POA: Insufficient documentation

## 2018-05-07 HISTORY — DX: Nonrheumatic aortic valve disorder, unspecified: I35.9

## 2018-05-08 MED FILL — Heparin Sod (Porcine)-NaCl IV Soln 1000 Unit/500ML-0.9%: INTRAVENOUS | Qty: 500 | Status: AC

## 2018-05-09 DIAGNOSIS — N186 End stage renal disease: Secondary | ICD-10-CM | POA: Diagnosis not present

## 2018-05-09 DIAGNOSIS — N2581 Secondary hyperparathyroidism of renal origin: Secondary | ICD-10-CM | POA: Diagnosis not present

## 2018-05-10 ENCOUNTER — Telehealth: Payer: Self-pay | Admitting: Cardiology

## 2018-05-10 NOTE — Progress Notes (Signed)
Virtual Visit via Video Note changed to phone visit at patient request.   This visit type was conducted due to national recommendations for restrictions regarding the COVID-19 Pandemic (e.g. social distancing) in an effort to limit this patient's exposure and mitigate transmission in our community.  Due to his co-morbid illnesses, this patient is at least at moderate risk for complications without adequate follow up.  This format is felt to be most appropriate for this patient at this time.  All issues noted in this document were discussed and addressed.  A limited physical exam was performed with this format.  Please refer to the patient's chart for his consent to telehealth for [facility].   Date:  05/13/2018   ID:  Justin [name], DOB 11/12/1950, MRN [id number]  Patient Location: Home Provider Location: Home  PCP:  Reynold Bowen, MD  Cardiologist:  Kirk Ruths, MD   Evaluation Performed:  Follow-Up Visit  Chief Complaint:  FU AS and hypertension  History of Present Illness:    [facility] with hypertensive urgency and congestive heart failure.  Also with chest discomfort.  Echocardiogram April 2020 showed ejection fraction 35 to 40%, moderate left ventricular enlargement, moderate left atrial enlargement, small pericardial effusion, moderate aortic stenosis.  Cardiac catheterization revealed 50 to 60% mid LAD, apical 90% LAD, 75% first diagonal, 75% circumflex, occluded first marginal, 50% right coronary artery and 95% PDA.  Mild aortic stenosis.  Medical therapy recommended. Pt also had baseline renal insufficiency and initial creatinine was 6.76.  He was ultimately started on dialysis.  Since he was discharged patient denies dyspnea, chest pain, palpitations or syncope.  The patient does not have symptoms concerning for COVID-19 infection (fever, chills, cough, or new shortness of breath).    Past Medical History:  Diagnosis Date  . BPH  (benign prostatic hyperplasia)   . Chronic kidney disease (CKD), stage IV (severe) ([place])   . Diabetes ([place])   . Dysuria   . Erectile dysfunction   . Hyperlipemia   . Hypertension   . Pinna disorder, left   . Renal disorder    Past Surgical History:  Procedure Laterality Date  . AV FISTULA PLACEMENT Left 04/30/2018   Procedure: CREATION OF RADIOCEPHALIC ARTERIOVENOUS FISTULA LEFT ARM;  Surgeon: Elam Dutch, MD;  Location: [place];  Service: Vascular;  Laterality: Left;  . IR FLUORO GUIDE CV LINE RIGHT  04/29/2018  . IR US GUIDE VASC ACCESS RIGHT  04/29/2018  . RIGHT/LEFT HEART CATH AND CORONARY ANGIOGRAPHY N/A 05/01/2018   Procedure: RIGHT/LEFT HEART CATH AND CORONARY ANGIOGRAPHY;  Surgeon: Belva Crome, MD;  Location: [place] [facility];  Service: Cardiovascular;  Laterality: N/A;     Current Meds  Medication Sig  . aspirin EC 81 MG EC tablet Take 1 tablet (81 mg total) by mouth daily.  . hydrALAZINE (APRESOLINE) 25 MG tablet Take 1.5 tablets (37.5 mg total) by mouth 3 (three) times daily.  . isosorbide mononitrate (IMDUR) 30 MG 24 hr tablet Take 1 tablet (30 mg total) by mouth daily.  . metoprolol succinate (TOPROL-XL) 50 MG 24 hr tablet Take 1 tablet (50 mg total) by mouth daily. Take with or immediately following a meal.  . NOVOLIN 70/30 RELION (70-30) 100 UNIT/ML injection Inject 20 Units into the skin daily.  . rosuvastatin (CRESTOR) 40 MG tablet Take 1 tablet (40 mg total) by mouth daily.     Allergies:   Patient has no known allergies.  Social History   Tobacco Use  . Smoking status: Never Smoker  . Smokeless tobacco: Never Used  Substance Use Topics  . Alcohol use: Not Currently  . Drug use: Never     Family Hx: The patient's family history is not on file.  ROS:   Please see the history of present illness.    No fevers, chills or productive cough.  He has had some cramping on dialysis days. All other systems reviewed and are negative.   Recent Labs:  04/24/2018: ALT 26; B Natriuretic Peptide 1,674.1; TSH 3.155 05/01/2018: Hemoglobin 9.9; Magnesium 2.1; Platelets 239 05/02/2018: BUN 50; Creatinine, Ser 5.78; Potassium 3.9; Sodium 136   Recent Lipid Panel Lab Results  Component Value Date/Time   CHOL 109 04/25/2018 04:29 AM   TRIG 73 04/25/2018 04:29 AM   HDL 34 (L) 04/25/2018 04:29 AM   CHOLHDL 3.2 04/25/2018 04:29 AM   LDLCALC 60 04/25/2018 04:29 AM    Wt Readings from Last 3 Encounters:  05/13/18 178 lb (80.7 kg)  05/02/18 177 lb 0.5 oz (80.3 kg)     Objective:    Vital Signs:  Ht 5\' 9"  (1.753 m)   Wt 178 lb (80.7 kg)   BMI 26.29 kg/m    VITAL SIGNS:  reviewed  No acute distress Answers questions appropriately Normal affect Remainder of physical examination not performed (telehealth visit; coronavirus pandemic)  ASSESSMENT & PLAN:    1. Coronary artery disease-patient denies chest pain.  Plan is medical therapy.  Continue aspirin and statin. 2. Aortic stenosis-mild by cardiac catheterization and probable moderate by echocardiogram.  Patient will need follow-up echoes in the future. 3. Cardiomyopathy-likely hypertensive mediated.  We will plan to repeat echocardiogram in 3 to 6 months after medications fully titrated.  Continue hydralazine, nitrates and beta-blocker. 4. Hypertension-blood pressure is controlled.  Now that dialysis has been initiated we will need to follow his blood pressure closely and watch for hypotension. 5. End-stage renal disease-dialysis has been initiated.  Per nephrology. 6. Hyperlipidemia-continue statin.  Check lipids and liver in 8-12 weeks. 7. Combined systolic/diastolic congestive heart failure-volume status is now managed by dialysis.  COVID-19 Education: The importance of social distancing was discussed today.  Time:   Today, I have spent 15 minutes with the patient with telehealth technology discussing the above problems.     Medication Adjustments/Labs and Tests Ordered: Current  medicines are reviewed at length with the patient today.  Concerns regarding medicines are outlined above.   Tests Ordered: No orders of the defined types were placed in this encounter.   Medication Changes: No orders of the defined types were placed in this encounter.   Disposition:  Follow up in 3 month(s)  Signed, Kirk Ruths, MD  05/13/2018 8:28 AM    [place]

## 2018-05-10 NOTE — Telephone Encounter (Signed)
Smartphone/ declined my chart/ consent/ pre reg completed

## 2018-05-11 DIAGNOSIS — N186 End stage renal disease: Secondary | ICD-10-CM | POA: Diagnosis not present

## 2018-05-11 DIAGNOSIS — N2581 Secondary hyperparathyroidism of renal origin: Secondary | ICD-10-CM | POA: Diagnosis not present

## 2018-05-13 ENCOUNTER — Telehealth (INDEPENDENT_AMBULATORY_CARE_PROVIDER_SITE_OTHER): Payer: Medicare HMO | Admitting: Cardiology

## 2018-05-13 VITALS — Ht 69.0 in | Wt 178.0 lb

## 2018-05-13 DIAGNOSIS — I2510 Atherosclerotic heart disease of native coronary artery without angina pectoris: Secondary | ICD-10-CM

## 2018-05-13 DIAGNOSIS — E7800 Pure hypercholesterolemia, unspecified: Secondary | ICD-10-CM

## 2018-05-13 DIAGNOSIS — I420 Dilated cardiomyopathy: Secondary | ICD-10-CM

## 2018-05-13 DIAGNOSIS — I10 Essential (primary) hypertension: Secondary | ICD-10-CM

## 2018-05-13 DIAGNOSIS — I350 Nonrheumatic aortic (valve) stenosis: Secondary | ICD-10-CM

## 2018-05-13 NOTE — Patient Instructions (Signed)
Medication Instructions:  NO CHANGE If you need a refill on your cardiac medications before your next appointment, please call your pharmacy.   Lab work: If you have labs (blood work) drawn today and your tests are completely normal, you will receive your results only by: . MyChart Message (if you have MyChart) OR . A paper copy in the mail If you have any lab test that is abnormal or we need to change your treatment, we will call you to review the results.  Follow-Up: At [facility], you and your health needs are our priority.  As part of our continuing mission to provide you with exceptional heart care, we have created designated Provider Care Teams.  These Care Teams include your primary Cardiologist (physician) and Advanced Practice Providers (APPs -  Physician Assistants and Nurse Practitioners) who all work together to provide you with the care you need, when you need it. Your physician recommends that you schedule a follow-up appointment in: 3 MONTHS WITH DR CRENSHAW      

## 2018-05-14 DIAGNOSIS — N186 End stage renal disease: Secondary | ICD-10-CM | POA: Diagnosis not present

## 2018-05-14 DIAGNOSIS — N2581 Secondary hyperparathyroidism of renal origin: Secondary | ICD-10-CM | POA: Diagnosis not present

## 2018-05-16 DIAGNOSIS — N2581 Secondary hyperparathyroidism of renal origin: Secondary | ICD-10-CM | POA: Diagnosis not present

## 2018-05-16 DIAGNOSIS — N186 End stage renal disease: Secondary | ICD-10-CM | POA: Diagnosis not present

## 2018-05-18 DIAGNOSIS — N2581 Secondary hyperparathyroidism of renal origin: Secondary | ICD-10-CM | POA: Diagnosis not present

## 2018-05-18 DIAGNOSIS — N186 End stage renal disease: Secondary | ICD-10-CM | POA: Diagnosis not present

## 2018-05-21 DIAGNOSIS — N2581 Secondary hyperparathyroidism of renal origin: Secondary | ICD-10-CM | POA: Diagnosis not present

## 2018-05-21 DIAGNOSIS — N186 End stage renal disease: Secondary | ICD-10-CM | POA: Diagnosis not present

## 2018-05-23 DIAGNOSIS — N2581 Secondary hyperparathyroidism of renal origin: Secondary | ICD-10-CM | POA: Diagnosis not present

## 2018-05-23 DIAGNOSIS — N186 End stage renal disease: Secondary | ICD-10-CM | POA: Diagnosis not present

## 2018-05-25 DIAGNOSIS — N186 End stage renal disease: Secondary | ICD-10-CM | POA: Diagnosis not present

## 2018-05-25 DIAGNOSIS — N2581 Secondary hyperparathyroidism of renal origin: Secondary | ICD-10-CM | POA: Diagnosis not present

## 2018-05-28 DIAGNOSIS — N2581 Secondary hyperparathyroidism of renal origin: Secondary | ICD-10-CM | POA: Diagnosis not present

## 2018-05-28 DIAGNOSIS — N186 End stage renal disease: Secondary | ICD-10-CM | POA: Diagnosis not present

## 2018-05-30 DIAGNOSIS — N186 End stage renal disease: Secondary | ICD-10-CM | POA: Diagnosis not present

## 2018-05-30 DIAGNOSIS — N2581 Secondary hyperparathyroidism of renal origin: Secondary | ICD-10-CM | POA: Diagnosis not present

## 2018-06-01 DIAGNOSIS — N2581 Secondary hyperparathyroidism of renal origin: Secondary | ICD-10-CM | POA: Diagnosis not present

## 2018-06-01 DIAGNOSIS — N186 End stage renal disease: Secondary | ICD-10-CM | POA: Diagnosis not present

## 2018-06-02 DIAGNOSIS — N186 End stage renal disease: Secondary | ICD-10-CM | POA: Diagnosis not present

## 2018-06-02 DIAGNOSIS — E1129 Type 2 diabetes mellitus with other diabetic kidney complication: Secondary | ICD-10-CM | POA: Diagnosis not present

## 2018-06-02 DIAGNOSIS — Z992 Dependence on renal dialysis: Secondary | ICD-10-CM | POA: Diagnosis not present

## 2018-06-04 DIAGNOSIS — N186 End stage renal disease: Secondary | ICD-10-CM | POA: Diagnosis not present

## 2018-06-04 DIAGNOSIS — N2581 Secondary hyperparathyroidism of renal origin: Secondary | ICD-10-CM | POA: Diagnosis not present

## 2018-06-05 ENCOUNTER — Other Ambulatory Visit: Payer: Self-pay

## 2018-06-05 DIAGNOSIS — N186 End stage renal disease: Secondary | ICD-10-CM

## 2018-06-06 ENCOUNTER — Encounter: Payer: Medicare HMO | Admitting: Family

## 2018-06-06 ENCOUNTER — Encounter (HOSPITAL_COMMUNITY): Payer: Medicare HMO

## 2018-06-06 DIAGNOSIS — N2581 Secondary hyperparathyroidism of renal origin: Secondary | ICD-10-CM | POA: Diagnosis not present

## 2018-06-06 DIAGNOSIS — N186 End stage renal disease: Secondary | ICD-10-CM | POA: Diagnosis not present

## 2018-06-08 DIAGNOSIS — N186 End stage renal disease: Secondary | ICD-10-CM | POA: Diagnosis not present

## 2018-06-08 DIAGNOSIS — N2581 Secondary hyperparathyroidism of renal origin: Secondary | ICD-10-CM | POA: Diagnosis not present

## 2018-06-11 ENCOUNTER — Telehealth (HOSPITAL_COMMUNITY): Payer: Self-pay | Admitting: Rehabilitation

## 2018-06-11 DIAGNOSIS — N186 End stage renal disease: Secondary | ICD-10-CM | POA: Diagnosis not present

## 2018-06-11 DIAGNOSIS — N2581 Secondary hyperparathyroidism of renal origin: Secondary | ICD-10-CM | POA: Diagnosis not present

## 2018-06-11 NOTE — Telephone Encounter (Signed)

## 2018-06-12 ENCOUNTER — Ambulatory Visit (INDEPENDENT_AMBULATORY_CARE_PROVIDER_SITE_OTHER): Payer: Self-pay | Admitting: Family

## 2018-06-12 ENCOUNTER — Ambulatory Visit (HOSPITAL_COMMUNITY)
Admission: RE | Admit: 2018-06-12 | Discharge: 2018-06-12 | Disposition: A | Discharge status: Home / Self Care | Payer: Medicare HMO | Source: Ambulatory Visit | Attending: Family | Admitting: Family

## 2018-06-12 ENCOUNTER — Encounter: Payer: Self-pay | Admitting: Family

## 2018-06-12 ENCOUNTER — Other Ambulatory Visit: Payer: Self-pay

## 2018-06-12 VITALS — BP 152/58 | HR 59 | Temp 97.9°F | Ht 69.0 in | Wt 187.1 lb

## 2018-06-12 DIAGNOSIS — N186 End stage renal disease: Secondary | ICD-10-CM | POA: Diagnosis not present

## 2018-06-12 DIAGNOSIS — Z992 Dependence on renal dialysis: Secondary | ICD-10-CM

## 2018-06-12 DIAGNOSIS — I770 Arteriovenous fistula, acquired: Secondary | ICD-10-CM

## 2018-06-12 NOTE — Progress Notes (Signed)
CC: duplex follow up s/p AVF creation  History of Present Illness  Justin [name] is a 68 y.o. (1950/06/06) male who is s/p Left Radial Cephalic AV fistula creation on 04-30-18 by Dr. Oneida Alar. Pt returns today for AVF duplex and follow up.  He dialyzes T-TH-S via right IJ TDC.  He denies steal type sx's in his left hand.   He denies any problems with his legs.    Past Medical History:  Diagnosis Date  . BPH (benign prostatic hyperplasia)   . Chronic kidney disease (CKD), stage IV (severe) ([place])   . Diabetes ([place])   . Dysuria   . Erectile dysfunction   . Hyperlipemia   . Hypertension   . Pinna disorder, left   . Renal disorder     Social History Social History   Tobacco Use  . Smoking status: Never Smoker  . Smokeless tobacco: Never Used  Substance Use Topics  . Alcohol use: Not Currently  . Drug use: Never    Family History No family history on file.  Surgical History Past Surgical History:  Procedure Laterality Date  . AV FISTULA PLACEMENT Left 04/30/2018   Procedure: CREATION OF RADIOCEPHALIC ARTERIOVENOUS FISTULA LEFT ARM;  Surgeon: Elam Dutch, MD;  Location: [place];  Service: Vascular;  Laterality: Left;  . IR FLUORO GUIDE CV LINE RIGHT  04/29/2018  . IR US GUIDE VASC ACCESS RIGHT  04/29/2018  . RIGHT/LEFT HEART CATH AND CORONARY ANGIOGRAPHY N/A 05/01/2018   Procedure: RIGHT/LEFT HEART CATH AND CORONARY ANGIOGRAPHY;  Surgeon: Belva Crome, MD;  Location: [place] [facility];  Service: Cardiovascular;  Laterality: N/A;    No Known Allergies  Current Outpatient Medications  Medication Sig Dispense Refill  . aspirin EC 81 MG EC tablet Take 1 tablet (81 mg total) by mouth daily. 30 tablet 0  . hydrALAZINE (APRESOLINE) 25 MG tablet Take 1.5 tablets (37.5 mg total) by mouth 3 (three) times daily. 90 tablet 0  . isosorbide mononitrate (IMDUR) 30 MG 24 hr tablet Take 1 tablet (30 mg total) by mouth daily. 30 tablet 0  . metoprolol succinate (TOPROL-XL) 50 MG  24 hr tablet Take 1 tablet (50 mg total) by mouth daily. Take with or immediately following a meal. 30 tablet 0  . NOVOLIN 70/30 RELION (70-30) 100 UNIT/ML injection Inject 20 Units into the skin daily. 10 mL 0  . rosuvastatin (CRESTOR) 40 MG tablet Take 1 tablet (40 mg total) by mouth daily. 30 tablet 0   No current facility-administered medications for this visit.      REVIEW OF SYSTEMS: see HPI for pertinent positives and negatives    PHYSICAL EXAMINATION:  Vitals:   06/12/18 1419  BP: (!) 152/58  Pulse: (!) 59  Temp: 97.9 F (36.6 C)  TempSrc: Temporal  SpO2: 96%  Weight: 187 lb 1.6 oz (84.9 kg)  Height: 5\' 9"  (1.753 m)   Body mass index is 27.63 kg/m.  General: The patient appears his stated age.   HEENT:  No gross abnormalities Pulmonary: Respirations are non-labored, CTAB Abdomen: Soft and non-tender  Musculoskeletal: There are no major deformities.   Neurologic: No focal weakness or paresthesias are detected Skin: There are no ulcer or rashes noted. Psychiatric: The patient has normal affect. Cardiovascular: There is a regular rate and rhythm without significant murmur appreciated.  Strong palpable thrill and good audible bruit in left forearm AVF. Bilateral radial pulses are 2+ palpable.   Non-Invasive Vascular Imaging  Left arm Access  Duplex  (Date: 06/12/2018):  +---------------------+----------+-----------------+--------+ AVF                  PSV (cm/s)Flow Vol (mL/min)Comments +---------------------+----------+-----------------+--------+ Native artery inflow    246           484                +---------------------+----------+-----------------+--------+ AVF Anastomosis Wrist   854                              +---------------------+----------+-----------------+--------+   +------------------------+----------+-------------+----------+--------+ OUTFLOW VEIN            PSV (cm/s)Diameter (cm)Depth (cm)Describe  +------------------------+----------+-------------+----------+--------+ Mid segment in Forearm     306        0.42        0.24            +------------------------+----------+-------------+----------+--------+ Mid to Dst Forearm anast   766        0.26        0.24            +------------------------+----------+-------------+----------+--------+ Dist Forearm Wrist         654        0.33        0.26            +------------------------+----------+-------------+----------+--------+ Summary: The left Radial Cephalic fistula appears patent with increased velocity at the anastamosis. Technically difficult exam due to small calliber of outflow vein.   Medical Decision Making  [name] is a 68 y.o. male who is s/p Left Radial Cephalic AV fistula creation on 04-30-18 by Dr. Oneida Alar. He has no steal sx's.  AVF duplex shows small diameters, shallow enough depths. Continue squeezing ball or washcloth in his left hand may time per day. Follow up in one month with AVF duplex, see me afterward. Return for AVF problems before that if needed.   [name], RN, MSN, FNP-C [facility] Office: [phone]  06/12/2018, 3:03 PM  Clinic MD: Scot Dock

## 2018-06-13 DIAGNOSIS — N186 End stage renal disease: Secondary | ICD-10-CM | POA: Diagnosis not present

## 2018-06-13 DIAGNOSIS — N2581 Secondary hyperparathyroidism of renal origin: Secondary | ICD-10-CM | POA: Diagnosis not present

## 2018-06-15 DIAGNOSIS — N2581 Secondary hyperparathyroidism of renal origin: Secondary | ICD-10-CM | POA: Diagnosis not present

## 2018-06-15 DIAGNOSIS — N186 End stage renal disease: Secondary | ICD-10-CM | POA: Diagnosis not present

## 2018-06-18 DIAGNOSIS — N2581 Secondary hyperparathyroidism of renal origin: Secondary | ICD-10-CM | POA: Diagnosis not present

## 2018-06-18 DIAGNOSIS — N186 End stage renal disease: Secondary | ICD-10-CM | POA: Diagnosis not present

## 2018-06-20 DIAGNOSIS — N186 End stage renal disease: Secondary | ICD-10-CM | POA: Diagnosis not present

## 2018-06-20 DIAGNOSIS — N2581 Secondary hyperparathyroidism of renal origin: Secondary | ICD-10-CM | POA: Diagnosis not present

## 2018-06-22 DIAGNOSIS — N186 End stage renal disease: Secondary | ICD-10-CM | POA: Diagnosis not present

## 2018-06-22 DIAGNOSIS — N2581 Secondary hyperparathyroidism of renal origin: Secondary | ICD-10-CM | POA: Diagnosis not present

## 2018-06-25 DIAGNOSIS — N186 End stage renal disease: Secondary | ICD-10-CM | POA: Diagnosis not present

## 2018-06-25 DIAGNOSIS — N2581 Secondary hyperparathyroidism of renal origin: Secondary | ICD-10-CM | POA: Diagnosis not present

## 2018-06-27 DIAGNOSIS — N186 End stage renal disease: Secondary | ICD-10-CM | POA: Diagnosis not present

## 2018-06-27 DIAGNOSIS — N2581 Secondary hyperparathyroidism of renal origin: Secondary | ICD-10-CM | POA: Diagnosis not present

## 2018-06-28 HISTORY — DX: Other disorders of phosphorus metabolism: E83.39

## 2018-06-29 DIAGNOSIS — N186 End stage renal disease: Secondary | ICD-10-CM | POA: Diagnosis not present

## 2018-06-29 DIAGNOSIS — N2581 Secondary hyperparathyroidism of renal origin: Secondary | ICD-10-CM | POA: Diagnosis not present

## 2018-07-02 DIAGNOSIS — E1129 Type 2 diabetes mellitus with other diabetic kidney complication: Secondary | ICD-10-CM | POA: Diagnosis not present

## 2018-07-02 DIAGNOSIS — N186 End stage renal disease: Secondary | ICD-10-CM | POA: Diagnosis not present

## 2018-07-02 DIAGNOSIS — Z992 Dependence on renal dialysis: Secondary | ICD-10-CM | POA: Diagnosis not present

## 2018-07-02 DIAGNOSIS — N2581 Secondary hyperparathyroidism of renal origin: Secondary | ICD-10-CM | POA: Diagnosis not present

## 2018-07-04 DIAGNOSIS — N186 End stage renal disease: Secondary | ICD-10-CM | POA: Diagnosis not present

## 2018-07-04 DIAGNOSIS — N2581 Secondary hyperparathyroidism of renal origin: Secondary | ICD-10-CM | POA: Diagnosis not present

## 2018-07-06 DIAGNOSIS — N186 End stage renal disease: Secondary | ICD-10-CM | POA: Diagnosis not present

## 2018-07-06 DIAGNOSIS — N2581 Secondary hyperparathyroidism of renal origin: Secondary | ICD-10-CM | POA: Diagnosis not present

## 2018-07-08 ENCOUNTER — Other Ambulatory Visit: Payer: Self-pay

## 2018-07-08 DIAGNOSIS — Z992 Dependence on renal dialysis: Secondary | ICD-10-CM

## 2018-07-08 DIAGNOSIS — N186 End stage renal disease: Secondary | ICD-10-CM

## 2018-07-09 ENCOUNTER — Telehealth (HOSPITAL_COMMUNITY): Payer: Self-pay

## 2018-07-09 DIAGNOSIS — N186 End stage renal disease: Secondary | ICD-10-CM | POA: Diagnosis not present

## 2018-07-09 DIAGNOSIS — N2581 Secondary hyperparathyroidism of renal origin: Secondary | ICD-10-CM | POA: Diagnosis not present

## 2018-07-09 NOTE — Telephone Encounter (Signed)
The above patient or their representative was contacted and gave the following answers to these questions:         Do you have any of the following symptoms?  no  Fever                    Cough                   Shortness of breath  Do  you have any of the following other symptoms? no   muscle pain         vomiting,        diarrhea        rash         weakness        red eye        abdominal pain         bruising          bruising or bleeding              joint pain           severe headache    Have you been in contact with someone who was or has been sick in the past 2 weeks?  no  Yes                 Unsure                         Unable to assess   Does the person that you were in contact with have any of the following symptoms?   Cough         shortness of breath           muscle pain         vomiting,            diarrhea            rash            weakness           fever            red eye           abdominal pain           bruising  or  bleeding                joint pain                severe headache               Have you  or someone you have been in contact with traveled internationally in th last month?   no      If yes, which countries?   Have you  or someone you have been in contact with traveled outside [place] in th last month?   no      If yes, which state and city?   COMMENTS OR ACTION PLAN FOR THIS PATIENT:          

## 2018-07-10 ENCOUNTER — Ambulatory Visit (INDEPENDENT_AMBULATORY_CARE_PROVIDER_SITE_OTHER): Payer: Self-pay | Admitting: Family

## 2018-07-10 ENCOUNTER — Encounter: Payer: Self-pay | Admitting: Family

## 2018-07-10 ENCOUNTER — Other Ambulatory Visit: Payer: Self-pay

## 2018-07-10 ENCOUNTER — Ambulatory Visit (HOSPITAL_COMMUNITY)
Admission: RE | Admit: 2018-07-10 | Discharge: 2018-07-10 | Disposition: A | Discharge status: Home / Self Care | Payer: Medicare HMO | Source: Ambulatory Visit | Attending: Family | Admitting: Family

## 2018-07-10 VITALS — BP 155/72 | HR 70 | Temp 97.8°F | Resp 16 | Ht 69.0 in | Wt 191.1 lb

## 2018-07-10 DIAGNOSIS — I770 Arteriovenous fistula, acquired: Secondary | ICD-10-CM

## 2018-07-10 DIAGNOSIS — N186 End stage renal disease: Secondary | ICD-10-CM | POA: Insufficient documentation

## 2018-07-10 DIAGNOSIS — Z992 Dependence on renal dialysis: Secondary | ICD-10-CM | POA: Diagnosis not present

## 2018-07-10 NOTE — Progress Notes (Signed)
CC: second follow up s/p placement of left forearm AVF  History of Present Illness  Justin [name] is a 68 y.o. (1950/04/28) male who is s/p Left Radial Cephalic AV fistula creation on 04-30-18 by Dr. Oneida Alar. Pt returns today for second AVF duplex and follow up. Diameters were small.   He dialyzes T-TH-S via right IJ TDC.  He denies steal type sx's in his left hand.   He denies any problems with his legs.   The patient is right hand dominant.    Past Medical History:  Diagnosis Date  . BPH (benign prostatic hyperplasia)   . Chronic kidney disease (CKD), stage IV (severe) ([place])   . Diabetes ([place])   . Dysuria   . Erectile dysfunction   . Hyperlipemia   . Hypertension   . Pinna disorder, left   . Renal disorder     Social History Social History   Tobacco Use  . Smoking status: Never Smoker  . Smokeless tobacco: Never Used  Substance Use Topics  . Alcohol use: Not Currently  . Drug use: Never    Family History History reviewed. No pertinent family history.  Surgical History Past Surgical History:  Procedure Laterality Date  . AV FISTULA PLACEMENT Left 04/30/2018   Procedure: CREATION OF RADIOCEPHALIC ARTERIOVENOUS FISTULA LEFT ARM;  Surgeon: Elam Dutch, MD;  Location: [place];  Service: Vascular;  Laterality: Left;  . IR FLUORO GUIDE CV LINE RIGHT  04/29/2018  . IR US GUIDE VASC ACCESS RIGHT  04/29/2018  . RIGHT/LEFT HEART CATH AND CORONARY ANGIOGRAPHY N/A 05/01/2018   Procedure: RIGHT/LEFT HEART CATH AND CORONARY ANGIOGRAPHY;  Surgeon: Belva Crome, MD;  Location: [place] [facility];  Service: Cardiovascular;  Laterality: N/A;    No Known Allergies  Current Outpatient Medications  Medication Sig Dispense Refill  . aspirin EC 81 MG EC tablet Take 1 tablet (81 mg total) by mouth daily. 30 tablet 0  . hydrALAZINE (APRESOLINE) 25 MG tablet Take 1.5 tablets (37.5 mg total) by mouth 3 (three) times daily. 90 tablet 0  . isosorbide mononitrate (IMDUR) 30 MG 24  hr tablet Take 1 tablet (30 mg total) by mouth daily. 30 tablet 0  . metoprolol succinate (TOPROL-XL) 50 MG 24 hr tablet Take 1 tablet (50 mg total) by mouth daily. Take with or immediately following a meal. 30 tablet 0  . NOVOLIN 70/30 RELION (70-30) 100 UNIT/ML injection Inject 20 Units into the skin daily. 10 mL 0  . rosuvastatin (CRESTOR) 40 MG tablet Take 1 tablet (40 mg total) by mouth daily. 30 tablet 0   No current facility-administered medications for this visit.      REVIEW OF SYSTEMS: see HPI for pertinent positives and negatives    PHYSICAL EXAMINATION:  Vitals:   07/10/18 1136  BP: (!) 155/72  Pulse: 70  Resp: 16  Temp: 97.8 F (36.6 C)  TempSrc: Temporal  SpO2: 96%  Weight: 191 lb 1.6 oz (86.7 kg)  Height: 5\' 9"  (1.753 m)   Body mass index is 28.22 kg/m.  General: The patient appears their stated age.   HEENT:  No gross abnormalities Pulmonary: Respirations are non-labored Abdomen: Soft and non-tender with. Musculoskeletal: There are no major deformities.   Neurologic: No focal weakness or paresthesias are detected, Skin: There are no ulcer or rashes noted. Psychiatric: The patient has normal affect. Cardiovascular: There is a regular rate and rhythm without significant murmur appreciated.   Palpable thrill and audible bruit at left  forearm AVF, stronger at distal portion.    Non-Invasive Vascular Imaging  Left forearm Access Duplex  (Date: 07/10/2018):  Findings: +--------------------+----------+-----------------+--------+ AVF                 PSV (cm/s)Flow Vol (mL/min)Comments +--------------------+----------+-----------------+--------+ Native artery inflow   293          1225                +--------------------+----------+-----------------+--------+ AVF Anastomosis        512                              +--------------------+----------+-----------------+--------+    +------------+----------+-------------+----------+----------------+ OUTFLOW VEINPSV (cm/s)Diameter (cm)Depth (cm)    Describe     +------------+----------+-------------+----------+----------------+ AC Fossa        57        0.50        0.26   competing branch +------------+----------+-------------+----------+----------------+ Prox Forearm    96        0.49        0.19                    +------------+----------+-------------+----------+----------------+ Mid Forearm    120        0.40        0.22   competing branch +------------+----------+-------------+----------+----------------+ Dist Forearm   470        0.41        0.26                    +------------+----------+-------------+----------+----------------+   Summary: Arteriovenous fistula-Velocities less than 100cm/s noted in the antecubital fossa and proximal forearm.  Patent left radiocephalic fistula without evidence of hemodynamically significant stenosis.    Medical Decision Making  Justin [name] is a 68 y.o. male who is s/p Left Radial Cephalic AV fistula creation on 04-30-18 by Dr. Oneida Alar.  He has no steal symptoms.  Palpable thrill and audible bruit at left forearm AVF, stronger distally.   I discussed with Dr. Oneida Alar AVF duplex results from today; is still shallow enough, diameters are larger but still all less than 0.6 cm.  May access AVF by the end of this month. If not successful, pt will need to make an appointment with one of our vascular surgeons to discuss another option for permanent access.    [name], RN, MSN, FNP-C [facility] Office: [phone]  07/10/2018, 12:06 PM  Clinic MD: Laqueta Due

## 2018-07-11 DIAGNOSIS — N2581 Secondary hyperparathyroidism of renal origin: Secondary | ICD-10-CM | POA: Diagnosis not present

## 2018-07-11 DIAGNOSIS — N186 End stage renal disease: Secondary | ICD-10-CM | POA: Diagnosis not present

## 2018-07-13 DIAGNOSIS — N186 End stage renal disease: Secondary | ICD-10-CM | POA: Diagnosis not present

## 2018-07-13 DIAGNOSIS — N2581 Secondary hyperparathyroidism of renal origin: Secondary | ICD-10-CM | POA: Diagnosis not present

## 2018-07-16 DIAGNOSIS — N2581 Secondary hyperparathyroidism of renal origin: Secondary | ICD-10-CM | POA: Diagnosis not present

## 2018-07-16 DIAGNOSIS — N186 End stage renal disease: Secondary | ICD-10-CM | POA: Diagnosis not present

## 2018-07-18 DIAGNOSIS — N2581 Secondary hyperparathyroidism of renal origin: Secondary | ICD-10-CM | POA: Diagnosis not present

## 2018-07-18 DIAGNOSIS — N186 End stage renal disease: Secondary | ICD-10-CM | POA: Diagnosis not present

## 2018-07-20 DIAGNOSIS — N186 End stage renal disease: Secondary | ICD-10-CM | POA: Diagnosis not present

## 2018-07-20 DIAGNOSIS — N2581 Secondary hyperparathyroidism of renal origin: Secondary | ICD-10-CM | POA: Diagnosis not present

## 2018-07-23 DIAGNOSIS — N186 End stage renal disease: Secondary | ICD-10-CM | POA: Diagnosis not present

## 2018-07-23 DIAGNOSIS — N2581 Secondary hyperparathyroidism of renal origin: Secondary | ICD-10-CM | POA: Diagnosis not present

## 2018-07-25 ENCOUNTER — Ambulatory Visit: Payer: Medicare HMO

## 2018-07-25 DIAGNOSIS — N186 End stage renal disease: Secondary | ICD-10-CM | POA: Diagnosis not present

## 2018-07-25 DIAGNOSIS — N2581 Secondary hyperparathyroidism of renal origin: Secondary | ICD-10-CM | POA: Diagnosis not present

## 2018-07-27 DIAGNOSIS — N186 End stage renal disease: Secondary | ICD-10-CM | POA: Diagnosis not present

## 2018-07-27 DIAGNOSIS — N2581 Secondary hyperparathyroidism of renal origin: Secondary | ICD-10-CM | POA: Diagnosis not present

## 2018-07-30 DIAGNOSIS — N186 End stage renal disease: Secondary | ICD-10-CM | POA: Diagnosis not present

## 2018-07-30 DIAGNOSIS — N2581 Secondary hyperparathyroidism of renal origin: Secondary | ICD-10-CM | POA: Diagnosis not present

## 2018-08-01 DIAGNOSIS — N2581 Secondary hyperparathyroidism of renal origin: Secondary | ICD-10-CM | POA: Diagnosis not present

## 2018-08-01 DIAGNOSIS — N186 End stage renal disease: Secondary | ICD-10-CM | POA: Diagnosis not present

## 2018-08-02 DIAGNOSIS — E1129 Type 2 diabetes mellitus with other diabetic kidney complication: Secondary | ICD-10-CM | POA: Diagnosis not present

## 2018-08-02 DIAGNOSIS — Z992 Dependence on renal dialysis: Secondary | ICD-10-CM | POA: Diagnosis not present

## 2018-08-02 DIAGNOSIS — N186 End stage renal disease: Secondary | ICD-10-CM | POA: Diagnosis not present

## 2018-08-03 DIAGNOSIS — Z992 Dependence on renal dialysis: Secondary | ICD-10-CM | POA: Diagnosis not present

## 2018-08-03 DIAGNOSIS — N186 End stage renal disease: Secondary | ICD-10-CM | POA: Diagnosis not present

## 2018-08-03 DIAGNOSIS — N2581 Secondary hyperparathyroidism of renal origin: Secondary | ICD-10-CM | POA: Diagnosis not present

## 2018-08-06 DIAGNOSIS — N2581 Secondary hyperparathyroidism of renal origin: Secondary | ICD-10-CM | POA: Diagnosis not present

## 2018-08-06 DIAGNOSIS — N186 End stage renal disease: Secondary | ICD-10-CM | POA: Diagnosis not present

## 2018-08-06 DIAGNOSIS — Z992 Dependence on renal dialysis: Secondary | ICD-10-CM | POA: Diagnosis not present

## 2018-08-08 DIAGNOSIS — N2581 Secondary hyperparathyroidism of renal origin: Secondary | ICD-10-CM | POA: Diagnosis not present

## 2018-08-08 DIAGNOSIS — Z992 Dependence on renal dialysis: Secondary | ICD-10-CM | POA: Diagnosis not present

## 2018-08-08 DIAGNOSIS — N186 End stage renal disease: Secondary | ICD-10-CM | POA: Diagnosis not present

## 2018-08-10 DIAGNOSIS — N2581 Secondary hyperparathyroidism of renal origin: Secondary | ICD-10-CM | POA: Diagnosis not present

## 2018-08-10 DIAGNOSIS — N186 End stage renal disease: Secondary | ICD-10-CM | POA: Diagnosis not present

## 2018-08-10 DIAGNOSIS — Z992 Dependence on renal dialysis: Secondary | ICD-10-CM | POA: Diagnosis not present

## 2018-08-13 DIAGNOSIS — N2581 Secondary hyperparathyroidism of renal origin: Secondary | ICD-10-CM | POA: Diagnosis not present

## 2018-08-13 DIAGNOSIS — Z992 Dependence on renal dialysis: Secondary | ICD-10-CM | POA: Diagnosis not present

## 2018-08-13 DIAGNOSIS — N186 End stage renal disease: Secondary | ICD-10-CM | POA: Diagnosis not present

## 2018-08-14 ENCOUNTER — Telehealth (HOSPITAL_COMMUNITY): Payer: Self-pay | Admitting: Rehabilitation

## 2018-08-14 ENCOUNTER — Telehealth: Payer: Self-pay

## 2018-08-14 NOTE — Telephone Encounter (Signed)

## 2018-08-14 NOTE — Telephone Encounter (Signed)

## 2018-08-15 ENCOUNTER — Other Ambulatory Visit: Payer: Self-pay

## 2018-08-15 ENCOUNTER — Ambulatory Visit (INDEPENDENT_AMBULATORY_CARE_PROVIDER_SITE_OTHER): Payer: Medicare HMO | Admitting: Family

## 2018-08-15 ENCOUNTER — Encounter: Payer: Self-pay | Admitting: Family

## 2018-08-15 VITALS — BP 145/65 | HR 65 | Temp 96.9°F | Resp 18 | Ht 69.0 in | Wt 195.0 lb

## 2018-08-15 DIAGNOSIS — T82898A Other specified complication of vascular prosthetic devices, implants and grafts, initial encounter: Secondary | ICD-10-CM | POA: Diagnosis not present

## 2018-08-15 DIAGNOSIS — Z992 Dependence on renal dialysis: Secondary | ICD-10-CM

## 2018-08-15 DIAGNOSIS — N2581 Secondary hyperparathyroidism of renal origin: Secondary | ICD-10-CM | POA: Diagnosis not present

## 2018-08-15 DIAGNOSIS — N186 End stage renal disease: Secondary | ICD-10-CM | POA: Diagnosis not present

## 2018-08-15 NOTE — Progress Notes (Signed)
CC: left forearm AVF not usable for hemodialysis  History of Present Illness  Justin [name] is a 68 y.o. (1950-11-10) male who is s/pLeft Radial Cephalic AV fistulacreation on 04-30-18 by Dr. Oneida Alar.  He returns today with report that his left forearm AVF is not usable for hemodialysis.   He dialyzes T-TH-S via right IJ TDC.  He denies steal type sx's in his left hand.   He denies any problems with his legs.  He is right hand dominant.     Past Medical History:  Diagnosis Date  . BPH (benign prostatic hyperplasia)   . Chronic kidney disease (CKD), stage IV (severe) ([place])   . Diabetes ([place])   . Dysuria   . Erectile dysfunction   . Hyperlipemia   . Hypertension   . Pinna disorder, left   . Renal disorder     Social History Social History   Tobacco Use  . Smoking status: Never Smoker  . Smokeless tobacco: Never Used  Substance Use Topics  . Alcohol use: Not Currently  . Drug use: Never    Family History Family History  Problem Relation Age of Onset  . Stroke Mother   . Diabetes Father     Surgical History Past Surgical History:  Procedure Laterality Date  . AV FISTULA PLACEMENT Left 04/30/2018   Procedure: CREATION OF RADIOCEPHALIC ARTERIOVENOUS FISTULA LEFT ARM;  Surgeon: Elam Dutch, MD;  Location: [facility] [place];  Service: Vascular;  Laterality: Left;  . IR FLUORO GUIDE CV LINE RIGHT  04/29/2018  . IR US GUIDE VASC ACCESS RIGHT  04/29/2018  . RIGHT/LEFT HEART CATH AND CORONARY ANGIOGRAPHY N/A 05/01/2018   Procedure: RIGHT/LEFT HEART CATH AND CORONARY ANGIOGRAPHY;  Surgeon: Belva Crome, MD;  Location: [place] [facility];  Service: Cardiovascular;  Laterality: N/A;    No Known Allergies  Current Outpatient Medications  Medication Sig Dispense Refill  . aspirin EC 81 MG EC tablet Take 1 tablet (81 mg total) by mouth daily. 30 tablet 0  . hydrALAZINE (APRESOLINE) 25 MG tablet Take 1.5 tablets (37.5 mg total) by mouth 3 (three) times daily. 90 tablet 0   . isosorbide mononitrate (IMDUR) 30 MG 24 hr tablet Take 1 tablet (30 mg total) by mouth daily. 30 tablet 0  . metoprolol succinate (TOPROL-XL) 50 MG 24 hr tablet Take 1 tablet (50 mg total) by mouth daily. Take with or immediately following a meal. 30 tablet 0  . NOVOLIN 70/30 RELION (70-30) 100 UNIT/ML injection Inject 20 Units into the skin daily. 10 mL 0  . rosuvastatin (CRESTOR) 40 MG tablet Take 1 tablet (40 mg total) by mouth daily. 30 tablet 0   No current facility-administered medications for this visit.      REVIEW OF SYSTEMS: see HPI for pertinent positives and negatives    PHYSICAL EXAMINATION:  Vitals:   08/15/18 1345  BP: (!) 145/65  Pulse: 65  Resp: 18  Temp: (!) 96.9 F (36.1 C)  TempSrc: Temporal  SpO2: 99%  Weight: 195 lb (88.5 kg)  Height: 5\' 9"  (1.753 m)   Body mass index is 28.8 kg/m.  General: The patient appears his stated age 66:  No gross abnormalities, full facial beard.    Pulmonary: Respirations are non-labored, CTAB, no rales, rhonchi, or wheezes Abdomen: Soft and non-tender with normal bowel sounds. Musculoskeletal: There are no major deformities.   Neurologic: No focal weakness or paresthesias are detected, bilateral hand grip strength is 5/5. Skin: There are no ulcer  or rashes noted. Psychiatric: The patient has normal affect. Cardiovascular: There is a regular rate and rhythm without significant murmur appreciated.  Left forearm AVF with no palpable thrill, no audible bruit.    Non-Invasive Vascular Imaging  Left forearm Access Duplex  (Date: 07/10/2018):  Findings: +--------------------+----------+-----------------+--------+ AVF PSV (cm/s)Flow Vol (mL/min)Comments +--------------------+----------+-----------------+--------+ Native artery inflow 293  1225   +--------------------+----------+-----------------+--------+ AVF Anastomosis  512     +--------------------+----------+-----------------+--------+  +------------+----------+-------------+----------+----------------+ OUTFLOW VEINPSV (cm/s)Diameter (cm)Depth (cm) Describe  +------------+----------+-------------+----------+----------------+ AC Fossa  57  0.50  0.26 competing branch +------------+----------+-------------+----------+----------------+ Prox Forearm 96  0.49  0.19   +------------+----------+-------------+----------+----------------+ Mid Forearm  120  0.40  0.22 competing branch +------------+----------+-------------+----------+----------------+ Dist Forearm 470  0.41  0.26   +------------+----------+-------------+----------+----------------+  Summary: Arteriovenous fistula-Velocities less than 100cm/s noted in the antecubital fossa and proximal forearm. Patent left radiocephalic fistula without evidence of hemodynamically significant stenosis.   Vein Mapping (04-28-18): +-----------------+-------------+----------+--------+ Right Cephalic   Diameter (cm)Depth (cm)Findings +-----------------+-------------+----------+--------+ Shoulder             0.47        1.29            +-----------------+-------------+----------+--------+ Prox upper arm       0.45        0.89            +-----------------+-------------+----------+--------+ Mid upper arm        0.42        0.31            +-----------------+-------------+----------+--------+ Dist upper arm       0.43        0.37            +-----------------+-------------+----------+--------+ Antecubital fossa    0.47        0.30            +-----------------+-------------+----------+--------+ Prox forearm         0.36        0.38            +-----------------+-------------+----------+--------+ Mid forearm          0.30        0.16             +-----------------+-------------+----------+--------+ Dist forearm         0.29        0.21            +-----------------+-------------+----------+--------+ Wrist                0.31        0.19            +-----------------+-------------+----------+--------+  +-----------------+-------------+----------+--------+ Right Basilic    Diameter (cm)Depth (cm)Findings +-----------------+-------------+----------+--------+ Prox upper arm       0.62        1.67            +-----------------+-------------+----------+--------+ Mid upper arm        0.52        1.04            +-----------------+-------------+----------+--------+ Dist upper arm       0.46        0.39            +-----------------+-------------+----------+--------+ Antecubital fossa    0.29        0.34            +-----------------+-------------+----------+--------+ Prox forearm         0.22        0.16            +-----------------+-------------+----------+--------+  Mid forearm          0.23        0.15            +-----------------+-------------+----------+--------+ Distal forearm       0.20        0.17            +-----------------+-------------+----------+--------+  +-----------------+-------------+----------+--------+ Left Cephalic    Diameter (cm)Depth (cm)Findings +-----------------+-------------+----------+--------+ Shoulder             0.49        1.46            +-----------------+-------------+----------+--------+ Prox upper arm       0.56        1.05            +-----------------+-------------+----------+--------+ Mid upper arm        0.48        0.56            +-----------------+-------------+----------+--------+ Dist upper arm       0.50        0.49            +-----------------+-------------+----------+--------+ Antecubital fossa    0.36        0.46            +-----------------+-------------+----------+--------+ Prox forearm          0.42        0.54            +-----------------+-------------+----------+--------+ Mid forearm          0.34        0.26            +-----------------+-------------+----------+--------+ Dist forearm         0.36        0.19            +-----------------+-------------+----------+--------+ Wrist                0.39        0.83            +-----------------+-------------+----------+--------+  +-----------------+-------------+----------+--------+ Left Basilic     Diameter (cm)Depth (cm)Findings +-----------------+-------------+----------+--------+ Prox upper arm       0.40        0.63            +-----------------+-------------+----------+--------+ Mid upper arm        0.40        0.58            +-----------------+-------------+----------+--------+ Dist upper arm       0.37        0.63            +-----------------+-------------+----------+--------+ Antecubital fossa    0.37        0.26            +-----------------+-------------+----------+--------+ Prox forearm         0.28        0.22            +-----------------+-------------+----------+--------+ Mid forearm          0.28        0.21            +-----------------+-------------+----------+--------+ Distal forearm       0.28        0.23            +-----------------+-------------+----------+--------+    Medical Decision Making  Justin [name] is a 68 y.o. male who is s/pLeft Radial Cephalic AV fistulacreation on 04-30-18  by Dr. Oneida Alar.  Pt states that his dialysis center has tried accessing his left forearm AVF and they are unable to dialyze using this. He dialyzes TTS via right IJ TDC.    He has no steal symptoms in his left upper extremity.   Left forearm AVF has no bruit and no thrill, is occluded.  I discussed with Dr. Oneida Alar who reviewed the vein mapping from April 2020.  Will schedule for left brachiocephalic AVF creation as soon as possible on a non HD day.     [name], RN, MSN, FNP-C [facility] Office: [phone]  08/15/2018, 1:48 PM  Clinic MD: Oneida Alar

## 2018-08-17 DIAGNOSIS — N186 End stage renal disease: Secondary | ICD-10-CM | POA: Diagnosis not present

## 2018-08-17 DIAGNOSIS — Z992 Dependence on renal dialysis: Secondary | ICD-10-CM | POA: Diagnosis not present

## 2018-08-17 DIAGNOSIS — N2581 Secondary hyperparathyroidism of renal origin: Secondary | ICD-10-CM | POA: Diagnosis not present

## 2018-08-20 DIAGNOSIS — N2581 Secondary hyperparathyroidism of renal origin: Secondary | ICD-10-CM | POA: Diagnosis not present

## 2018-08-20 DIAGNOSIS — Z992 Dependence on renal dialysis: Secondary | ICD-10-CM | POA: Diagnosis not present

## 2018-08-20 DIAGNOSIS — N186 End stage renal disease: Secondary | ICD-10-CM | POA: Diagnosis not present

## 2018-08-21 ENCOUNTER — Other Ambulatory Visit: Payer: Self-pay | Admitting: *Deleted

## 2018-08-22 DIAGNOSIS — N2581 Secondary hyperparathyroidism of renal origin: Secondary | ICD-10-CM | POA: Diagnosis not present

## 2018-08-22 DIAGNOSIS — Z992 Dependence on renal dialysis: Secondary | ICD-10-CM | POA: Diagnosis not present

## 2018-08-22 DIAGNOSIS — N186 End stage renal disease: Secondary | ICD-10-CM | POA: Diagnosis not present

## 2018-08-24 DIAGNOSIS — N2581 Secondary hyperparathyroidism of renal origin: Secondary | ICD-10-CM | POA: Diagnosis not present

## 2018-08-24 DIAGNOSIS — Z992 Dependence on renal dialysis: Secondary | ICD-10-CM | POA: Diagnosis not present

## 2018-08-24 DIAGNOSIS — N186 End stage renal disease: Secondary | ICD-10-CM | POA: Diagnosis not present

## 2018-08-26 ENCOUNTER — Other Ambulatory Visit (HOSPITAL_COMMUNITY)
Admission: RE | Admit: 2018-08-26 | Discharge: 2018-08-26 | Disposition: A | Discharge status: Home / Self Care | Payer: Medicare HMO | Source: Ambulatory Visit | Attending: Vascular Surgery | Admitting: Vascular Surgery

## 2018-08-26 DIAGNOSIS — Z01812 Encounter for preprocedural laboratory examination: Secondary | ICD-10-CM | POA: Insufficient documentation

## 2018-08-26 DIAGNOSIS — Z20828 Contact with and (suspected) exposure to other viral communicable diseases: Secondary | ICD-10-CM | POA: Insufficient documentation

## 2018-08-27 ENCOUNTER — Other Ambulatory Visit: Payer: Self-pay

## 2018-08-27 ENCOUNTER — Encounter (HOSPITAL_COMMUNITY): Payer: Self-pay | Admitting: *Deleted

## 2018-08-27 DIAGNOSIS — N2581 Secondary hyperparathyroidism of renal origin: Secondary | ICD-10-CM | POA: Diagnosis not present

## 2018-08-27 DIAGNOSIS — N186 End stage renal disease: Secondary | ICD-10-CM | POA: Diagnosis not present

## 2018-08-27 DIAGNOSIS — Z992 Dependence on renal dialysis: Secondary | ICD-10-CM | POA: Diagnosis not present

## 2018-08-27 LAB — SARS CORONAVIRUS 2 (TAT 6-24 HRS): SARS Coronavirus 2: NEGATIVE

## 2018-08-27 NOTE — Progress Notes (Signed)
Pt denies SOB and chest pain. Pt stated that he is under the care of Dr. Stanford Breed, Cardiology and Dr. Reynold Bowen, PCP. Pt denies having a stress test.  Pt made aware to stop taking  vitamins, fish oil and herbal medications. Do not take any NSAIDs ie: Ibuprofen, Advil, Naproxen (Aleve), Motrin, BC and Goody Powder. Pt stated that his fasting CBG ranges between 120-160. Pt made aware to hold am  Novolin 70/30 insulin. Pt made aware to check BG every 2 hours prior to arrival to hospital on DOS. Pt made aware to treat a BG < 70 with  4 ounces of apple juice, wait 15 minutes after intervention to recheck BG, if BG remains < 70, call Short Stay unit to speak with a nurse. Pt verbalized understanding of all pre-op instructions. PA, Anesthesiology asked to review pt history; see note.

## 2018-08-27 NOTE — Progress Notes (Signed)
Virtual Visit via Video Note changed to phone visit at patient request.   This visit type was conducted due to national recommendations for restrictions regarding the COVID-19 Pandemic (e.g. social distancing) in an effort to limit this patient's exposure and mitigate transmission in our community.  Due to his co-morbid illnesses, this patient is at least at moderate risk for complications without adequate follow up.  This format is felt to be most appropriate for this patient at this time.  All issues noted in this document were discussed and addressed.  A limited physical exam was performed with this format.  Please refer to the patient's chart for his consent to telehealth for [facility].    HPI:  Patient admitted to [facility] April 2020 with hypertensive urgency and congestive heart failure.  Also with chest discomfort. Echocardiogram April 2020 showed ejection fraction 35 to 40%, moderate left ventricular enlargement, moderate left atrial enlargement, small pericardial effusion, moderate aortic stenosis.  Cardiac catheterization revealed 50 to 60% mid LAD, apical 90% LAD, 75% first diagonal, 75% circumflex, occluded first marginal, 50% right coronary artery and 95% PDA.  Mild aortic stenosis.  Medical therapy recommended. Pt also had baseline renal insufficiency and initial creatinine was 6.76.  He was ultimately started on dialysis.  Since he was the patient has dyspnea with more extreme activities but not with routine activities. It is relieved with rest. It is not associated with chest pain. There is no orthopnea, PND or pedal edema. There is no syncope or palpitations. There is no exertional chest pain.   Current Outpatient Medications  Medication Sig Dispense Refill  . acetaminophen (TYLENOL) 325 MG tablet Take 650 mg by mouth every 6 (six) hours as needed (pain).    [name] Kitchen aspirin EC 81 MG EC tablet Take 1 tablet (81 mg total) by mouth daily. 30 tablet 0  . hydrALAZINE  (APRESOLINE) 25 MG tablet Take 1.5 tablets (37.5 mg total) by mouth 3 (three) times daily. (Patient taking differently: Take 37.5 mg by mouth 3 (three) times daily with meals. ) 90 tablet 0  . isosorbide mononitrate (IMDUR) 30 MG 24 hr tablet Take 1 tablet (30 mg total) by mouth daily. 30 tablet 0  . metoprolol succinate (TOPROL-XL) 50 MG 24 hr tablet Take 1 tablet (50 mg total) by mouth daily. Take with or immediately following a meal. 30 tablet 0  . NOVOLIN 70/30 RELION (70-30) 100 UNIT/ML injection Inject 20 Units into the skin daily. 10 mL 0  . oxyCODONE (ROXICODONE) 5 MG immediate release tablet Take 1 tablet (5 mg total) by mouth every 6 (six) hours as needed. 6 tablet 0  . rosuvastatin (CRESTOR) 5 MG tablet Take 5 mg by mouth daily.    . sevelamer carbonate (RENVELA) 800 MG tablet Take 2,400 mg by mouth 3 (three) times daily with meals.      No current facility-administered medications for this visit.      Past Medical History:  Diagnosis Date  . BPH (benign prostatic hyperplasia)   . Chronic kidney disease (CKD), stage IV (severe) ([place])   . Diabetes ([place])   . Dysuria   . Erectile dysfunction   . Hyperlipemia   . Hypertension   . Pinna disorder, left   . Renal disorder   . Wears glasses     Past Surgical History:  Procedure Laterality Date  . AV FISTULA PLACEMENT Left 04/30/2018   Procedure: CREATION OF RADIOCEPHALIC ARTERIOVENOUS FISTULA LEFT ARM;  Surgeon: Elam Dutch, MD;  Location: [facility];  Service: Vascular;  Laterality: Left;  . AV FISTULA PLACEMENT Left 08/28/2018   Procedure: ARTERIOVENOUS (AV) BRACHIOCEPHALIC FISTULA CREATION LEFT UPPER ARM;  Surgeon: Marty Heck, MD;  Location: [place];  Service: Vascular;  Laterality: Left;  . IR FLUORO GUIDE CV LINE RIGHT  04/29/2018  . IR US GUIDE VASC ACCESS RIGHT  04/29/2018  . RIGHT/LEFT HEART CATH AND CORONARY ANGIOGRAPHY N/A 05/01/2018   Procedure: RIGHT/LEFT HEART CATH AND CORONARY ANGIOGRAPHY;  Surgeon: Belva Crome, MD;  Location: [place] [facility];  Service: Cardiovascular;  Laterality: N/A;    Social History   Socioeconomic History  . Marital status: Divorced    Spouse name: Not on file  . Number of children: Not on file  . Years of education: Not on file  . Highest education level: Not on file  Occupational History  . Not on file  Social Needs  . Financial resource strain: Not on file  . Food insecurity    Worry: Not on file    Inability: Not on file  . Transportation needs    Medical: Not on file    Non-medical: Not on file  Tobacco Use  . Smoking status: Never Smoker  . Smokeless tobacco: Never Used  Substance and Sexual Activity  . Alcohol use: Not Currently  . Drug use: Never  . Sexual activity: Not Currently  Lifestyle  . Physical activity    Days per week: Not on file    Minutes per session: Not on file  . Stress: Not on file  Relationships  . Social Herbalist on phone: Not on file    Gets together: Not on file    Attends religious service: Not on file    Active member of club or organization: Not on file    Attends meetings of clubs or organizations: Not on file    Relationship status: Not on file  . Intimate partner violence    Fear of current or ex partner: Not on file    Emotionally abused: Not on file    Physically abused: Not on file    Forced sexual activity: Not on file  Other Topics Concern  . Not on file  Social History Narrative  . Not on file    Family History  Problem Relation Age of Onset  . Stroke Mother   . Diabetes Father     ROS: no fevers or chills, productive cough, hemoptysis, dysphasia, odynophagia, melena, hematochezia, dysuria, hematuria, rash, seizure activity, orthopnea, PND, pedal edema, claudication. Remaining systems are negative.  Physical Exam: Answers questions appropriately Normal affect No acute distress Remainder of physical examination not performed (coronavirus pandemic)  A/P  1 coronary artery  disease-patient denies chest pain.  Plan to continue medical therapy with aspirin and statin.  2 aortic stenosis-probable moderate by previous echocardiogram.  He will need follow-up echoes in the future.  3 cardiomyopathy-felt likely to be hypertensive mediated.  Continue hydralazine/nitrates and beta-blocker.  We will plan to repeat echocardiogram to reassess LV function now that blood pressure has improved.  4 hypertension-patient's blood pressure is presently controlled.  Much improved following initiation of dialysis.  5 end-stage renal disease-per nephrology.  6 hyperlipidemia-continue statin; given documented coronary disease I will increase Crestor to 40 mg daily.  Check lipids and liver in 6 weeks.  7 combined systolic/diastolic congestive heart failure-volume removal per dialysis.  Kirk Ruths, MD

## 2018-08-27 NOTE — Anesthesia Preprocedure Evaluation (Addendum)
Anesthesia Evaluation  Patient identified by MRN, date of birth, ID band Patient awake    Reviewed: Allergy & Precautions  Airway Mallampati: II  TM Distance: >3 FB     Dental   Pulmonary    breath sounds clear to auscultation       Cardiovascular hypertension, + CAD and +CHF   Rhythm:Regular Rate:Normal     Neuro/Psych    GI/Hepatic negative GI ROS,   Endo/Other  diabetes  Renal/GU Renal disease     Musculoskeletal   Abdominal   Peds  Hematology   Anesthesia Other Findings   Reproductive/Obstetrics                          Anesthesia Physical Anesthesia Plan  ASA: III  Anesthesia Plan: MAC   Post-op Pain Management:    Induction: Intravenous  PONV Risk Score and Plan: 1 and Midazolam  Airway Management Planned: Nasal Cannula and Simple Face Mask  Additional Equipment:   Intra-op Plan:   Post-operative Plan:   Informed Consent: I have reviewed the patients History and Physical, chart, labs and discussed the procedure including the risks, benefits and alternatives for the proposed anesthesia with the patient or authorized representative who has indicated his/her understanding and acceptance.     Dental advisory given  Plan Discussed with: Anesthesiologist and CRNA  Anesthesia Plan Comments: (Patient admitted to [facility] April 2020 with hypertensive urgency and congestive heart failure.  Also with chest discomfort.  Echocardiogram April 2020 showed ejection fraction 35 to 40%, moderate left ventricular enlargement, moderate left atrial enlargement, small pericardial effusion, moderate aortic stenosis.  Cardiac catheterization revealed 50 to 60% mid LAD, apical 90% LAD, 75% first diagonal, 75% circumflex, occluded first marginal, 50% right coronary artery and 95% PDA.  Mild aortic stenosis.  Medical therapy recommended. Pt also had baseline renal insufficiency and initial  creatinine was 6.76.  He was ultimately started on dialysis.  Seen by cardiology 05/13/18. Recommended continued medical therapy for CAD and serial Echos to monitor moderate AS. Volume now being managed with dialysis.   Cath 05/01/18:  Obstructive coronary artery disease with absence of proximal severe CAD.  Mid LAD 50 to 60%, apical LAD 90%, first diagonal 75%.  Proximal circumflex 75% giving origin to 2 very small obtuse marginals, the first of which is totally occluded.  Large dominant ramus intermedius supplying much of the territory of the circumflex.  Dominant RCA with mid vessel 50% stenosis and proximal to mid 95% PDA.  Mild pulmonary hypertension.  Minimal aortic stenosis with approximately 9 mm peak to peak transvalvular gradient.  Upper normal LVEDP, 18 mmHg.  RECOMMENDATIONS:   Aggressive secondary risk factor modification.  LDL less than 70, hemoglobin A1c less than 7, and blood pressure 130/80 mmHg or less.  81 mg aspirin daily unless contraindicated.  Anti-ischemic therapy as needed for chest discomfort.  TTE 04/25/18:  1. The left ventricle has moderately reduced systolic function, with an ejection fraction of 35-40%. The cavity size was moderately dilated. Left ventricular diastolic parameters were normal.  2. Inferior and septal hypokinesis.  3. The right ventricle has normal systolc function. The cavity was mildly enlarged. There is no increase in right ventricular wall thickness.  4. Left atrial size was moderately dilated.  5. Small pericardial effusion.  6. Small non circumferential pericardial effusion no tamponade.  7. Mild thickening of the mitral valve leaflet.  8. The aortic valve is tricuspid Moderate thickening of the  aortic valve Moderate calcification of the aortic valve. Moderate stenosis of the aortic valve.  9. Likely tri leaflet given degree of LV dysfunction would grade AS as moderate. 10. The aortic root is normal in size and  structure. 11. The inferior vena cava was dilated in size with <50% respiratory variability. )      Anesthesia Quick Evaluation

## 2018-08-28 ENCOUNTER — Ambulatory Visit (HOSPITAL_COMMUNITY): Payer: Medicare HMO | Admitting: Physician Assistant

## 2018-08-28 ENCOUNTER — Encounter (HOSPITAL_COMMUNITY): Payer: Self-pay

## 2018-08-28 ENCOUNTER — Encounter (HOSPITAL_COMMUNITY)
Admission: RE | Disposition: A | Discharge status: Home / Self Care | Payer: Self-pay | Source: Home / Self Care | Attending: Vascular Surgery

## 2018-08-28 ENCOUNTER — Ambulatory Visit (HOSPITAL_COMMUNITY)
Admission: RE | Admit: 2018-08-28 | Discharge: 2018-08-28 | Disposition: A | Discharge status: Home / Self Care | Payer: Medicare HMO | Attending: Vascular Surgery | Admitting: Vascular Surgery

## 2018-08-28 ENCOUNTER — Other Ambulatory Visit: Payer: Self-pay

## 2018-08-28 DIAGNOSIS — E785 Hyperlipidemia, unspecified: Secondary | ICD-10-CM | POA: Insufficient documentation

## 2018-08-28 DIAGNOSIS — Z7982 Long term (current) use of aspirin: Secondary | ICD-10-CM | POA: Diagnosis not present

## 2018-08-28 DIAGNOSIS — I2510 Atherosclerotic heart disease of native coronary artery without angina pectoris: Secondary | ICD-10-CM | POA: Insufficient documentation

## 2018-08-28 DIAGNOSIS — I509 Heart failure, unspecified: Secondary | ICD-10-CM | POA: Insufficient documentation

## 2018-08-28 DIAGNOSIS — Z794 Long term (current) use of insulin: Secondary | ICD-10-CM | POA: Insufficient documentation

## 2018-08-28 DIAGNOSIS — I132 Hypertensive heart and chronic kidney disease with heart failure and with stage 5 chronic kidney disease, or end stage renal disease: Secondary | ICD-10-CM | POA: Diagnosis not present

## 2018-08-28 DIAGNOSIS — E1122 Type 2 diabetes mellitus with diabetic chronic kidney disease: Secondary | ICD-10-CM | POA: Diagnosis not present

## 2018-08-28 DIAGNOSIS — Z992 Dependence on renal dialysis: Secondary | ICD-10-CM | POA: Diagnosis not present

## 2018-08-28 DIAGNOSIS — Z79899 Other long term (current) drug therapy: Secondary | ICD-10-CM | POA: Diagnosis not present

## 2018-08-28 DIAGNOSIS — N186 End stage renal disease: Secondary | ICD-10-CM | POA: Diagnosis not present

## 2018-08-28 DIAGNOSIS — N185 Chronic kidney disease, stage 5: Secondary | ICD-10-CM

## 2018-08-28 DIAGNOSIS — I5021 Acute systolic (congestive) heart failure: Secondary | ICD-10-CM | POA: Diagnosis not present

## 2018-08-28 HISTORY — DX: Presence of spectacles and contact lenses: Z97.3

## 2018-08-28 HISTORY — PX: AV FISTULA PLACEMENT: SHX1204

## 2018-08-28 LAB — POCT I-STAT 4, (NA,K, GLUC, HGB,HCT)
Glucose, Bld: 191 mg/dL — ABNORMAL HIGH (ref 70–99)
HCT: 30 % — ABNORMAL LOW (ref 39.0–52.0)
Hemoglobin: 10.2 g/dL — ABNORMAL LOW (ref 13.0–17.0)
Potassium: 4.1 mmol/L (ref 3.5–5.1)
Sodium: 139 mmol/L (ref 135–145)

## 2018-08-28 LAB — GLUCOSE, CAPILLARY
Glucose-Capillary: 192 mg/dL — ABNORMAL HIGH (ref 70–99)
Glucose-Capillary: 210 mg/dL — ABNORMAL HIGH (ref 70–99)

## 2018-08-28 SURGERY — ARTERIOVENOUS (AV) FISTULA CREATION
Anesthesia: Monitor Anesthesia Care | Site: Arm Upper | Laterality: Left

## 2018-08-28 MED ORDER — OXYCODONE HCL 5 MG PO TABS: 5.0000 mg | ORAL_TABLET | Freq: Four times a day (QID) | ORAL | 0 refills | Status: DC | PRN

## 2018-08-28 MED ORDER — PROPOFOL 500 MG/50ML IV EMUL
INTRAVENOUS | Status: DC | PRN
  Administered 2018-08-28: 50 ug/kg/min via INTRAVENOUS

## 2018-08-28 MED ORDER — LIDOCAINE HCL (PF) 1 % IJ SOLN
INTRAMUSCULAR | Status: AC
  Filled 2018-08-28: qty 30

## 2018-08-28 MED ORDER — MIDAZOLAM HCL 5 MG/5ML IJ SOLN
INTRAMUSCULAR | Status: DC | PRN
  Administered 2018-08-28 (×2): 1 mg via INTRAVENOUS

## 2018-08-28 MED ORDER — LIDOCAINE 2% (20 MG/ML) 5 ML SYRINGE
INTRAMUSCULAR | Status: AC
  Filled 2018-08-28: qty 5

## 2018-08-28 MED ORDER — MIDAZOLAM HCL 2 MG/2ML IJ SOLN
INTRAMUSCULAR | Status: AC
  Filled 2018-08-28: qty 2

## 2018-08-28 MED ORDER — ONDANSETRON HCL 4 MG/2ML IJ SOLN
INTRAMUSCULAR | Status: AC
  Filled 2018-08-28: qty 2

## 2018-08-28 MED ORDER — SODIUM CHLORIDE 0.9 % IV SOLN
INTRAVENOUS | Status: AC
  Filled 2018-08-28: qty 1.2

## 2018-08-28 MED ORDER — EPHEDRINE 5 MG/ML INJ
INTRAVENOUS | Status: AC
  Filled 2018-08-28: qty 10

## 2018-08-28 MED ORDER — LIDOCAINE-EPINEPHRINE (PF) 1 %-1:200000 IJ SOLN
INTRAMUSCULAR | Status: AC
  Filled 2018-08-28: qty 30

## 2018-08-28 MED ORDER — EPHEDRINE SULFATE 50 MG/ML IJ SOLN
INTRAMUSCULAR | Status: DC | PRN
  Administered 2018-08-28: 10 mg via INTRAVENOUS

## 2018-08-28 MED ORDER — SODIUM CHLORIDE 0.9 % IV SOLN
INTRAVENOUS | Status: DC | PRN
  Administered 2018-08-28: 25 ug/min via INTRAVENOUS

## 2018-08-28 MED ORDER — PROPOFOL 10 MG/ML IV BOLUS
INTRAVENOUS | Status: DC | PRN
  Administered 2018-08-28: 25 mg via INTRAVENOUS
  Administered 2018-08-28: 50 mg via INTRAVENOUS

## 2018-08-28 MED ORDER — PROTAMINE SULFATE 10 MG/ML IV SOLN
INTRAVENOUS | Status: DC | PRN
  Administered 2018-08-28: 20 mg via INTRAVENOUS

## 2018-08-28 MED ORDER — SODIUM CHLORIDE 0.9 % IV SOLN
INTRAVENOUS | Status: DC
  Administered 2018-08-28: 07:00:00 via INTRAVENOUS

## 2018-08-28 MED ORDER — HEPARIN SODIUM (PORCINE) 1000 UNIT/ML IJ SOLN
INTRAMUSCULAR | Status: AC
  Filled 2018-08-28: qty 1

## 2018-08-28 MED ORDER — PHENYLEPHRINE HCL (PRESSORS) 10 MG/ML IV SOLN
INTRAVENOUS | Status: DC | PRN
  Administered 2018-08-28: 40 ug via INTRAVENOUS

## 2018-08-28 MED ORDER — FENTANYL CITRATE (PF) 100 MCG/2ML IJ SOLN: 25.0000 ug | INTRAMUSCULAR | Status: DC | PRN

## 2018-08-28 MED ORDER — PHENYLEPHRINE 40 MCG/ML (10ML) SYRINGE FOR IV PUSH (FOR BLOOD PRESSURE SUPPORT)
PREFILLED_SYRINGE | INTRAVENOUS | Status: AC
  Filled 2018-08-28: qty 10

## 2018-08-28 MED ORDER — LIDOCAINE HCL 1 % IJ SOLN
INTRAMUSCULAR | Status: DC | PRN
  Administered 2018-08-28: 30 mL

## 2018-08-28 MED ORDER — FENTANYL CITRATE (PF) 100 MCG/2ML IJ SOLN
INTRAMUSCULAR | Status: DC | PRN
  Administered 2018-08-28: 50 ug via INTRAVENOUS

## 2018-08-28 MED ORDER — FENTANYL CITRATE (PF) 250 MCG/5ML IJ SOLN
INTRAMUSCULAR | Status: AC
  Filled 2018-08-28: qty 5

## 2018-08-28 MED ORDER — PROPOFOL 10 MG/ML IV BOLUS
INTRAVENOUS | Status: AC
  Filled 2018-08-28: qty 40

## 2018-08-28 MED ORDER — SODIUM CHLORIDE 0.9 % IV SOLN
INTRAVENOUS | Status: DC | PRN
  Administered 2018-08-28: 500 mL

## 2018-08-28 MED ORDER — 0.9 % SODIUM CHLORIDE (POUR BTL) OPTIME
TOPICAL | Status: DC | PRN
  Administered 2018-08-28: 08:00:00 1000 mL

## 2018-08-28 MED ORDER — CEFAZOLIN SODIUM-DEXTROSE 2-4 GM/100ML-% IV SOLN
2.0000 g | INTRAVENOUS | Status: AC
  Administered 2018-08-28: 08:00:00 2 g via INTRAVENOUS

## 2018-08-28 MED ORDER — CEFAZOLIN SODIUM-DEXTROSE 2-4 GM/100ML-% IV SOLN
INTRAVENOUS | Status: AC
  Filled 2018-08-28: qty 100

## 2018-08-28 MED ORDER — HEPARIN SODIUM (PORCINE) 1000 UNIT/ML IJ SOLN
INTRAMUSCULAR | Status: DC | PRN
  Administered 2018-08-28: 5000 [IU] via INTRAVENOUS

## 2018-08-28 SURGICAL SUPPLY — 38 items
ADH SKN CLS APL DERMABOND .7 (GAUZE/BANDAGES/DRESSINGS) ×1
ADH SKN CLS LQ APL DERMABOND (GAUZE/BANDAGES/DRESSINGS)
AGENT HMST SPONGE THK3/8 (HEMOSTASIS)
ARMBAND PINK RESTRICT EXTREMIT (MISCELLANEOUS) ×2 IMPLANT
CANISTER SUCT 3000ML PPV (MISCELLANEOUS) ×2 IMPLANT
CLIP VESOCCLUDE MED 6/CT (CLIP) ×2 IMPLANT
CLIP VESOCCLUDE SM WIDE 6/CT (CLIP) ×2 IMPLANT
COVER PROBE W GEL 5X96 (DRAPES) ×2 IMPLANT
COVER WAND RF STERILE (DRAPES) IMPLANT
DECANTER SPIKE VIAL GLASS SM (MISCELLANEOUS) ×2 IMPLANT
DERMABOND ADHESIVE PROPEN (GAUZE/BANDAGES/DRESSINGS)
DERMABOND ADVANCED (GAUZE/BANDAGES/DRESSINGS) ×1
DERMABOND ADVANCED .7 DNX12 (GAUZE/BANDAGES/DRESSINGS) ×1 IMPLANT
DERMABOND ADVANCED .7 DNX6 (GAUZE/BANDAGES/DRESSINGS) IMPLANT
ELECT REM PT RETURN 9FT ADLT (ELECTROSURGICAL) ×2
ELECTRODE REM PT RTRN 9FT ADLT (ELECTROSURGICAL) ×1 IMPLANT
GLOVE BIO SURGEON STRL SZ7 (GLOVE) ×2 IMPLANT
GLOVE BIO SURGEON STRL SZ7.5 (GLOVE) ×2 IMPLANT
GLOVE BIOGEL PI IND STRL 8 (GLOVE) ×1 IMPLANT
GLOVE BIOGEL PI INDICATOR 8 (GLOVE) ×1
GOWN STRL REUS W/ TWL LRG LVL3 (GOWN DISPOSABLE) ×2 IMPLANT
GOWN STRL REUS W/ TWL XL LVL3 (GOWN DISPOSABLE) ×1 IMPLANT
GOWN STRL REUS W/TWL LRG LVL3 (GOWN DISPOSABLE) ×4
GOWN STRL REUS W/TWL XL LVL3 (GOWN DISPOSABLE) ×2
HEMOSTAT SPONGE AVITENE ULTRA (HEMOSTASIS) IMPLANT
KIT BASIN OR (CUSTOM PROCEDURE TRAY) ×2 IMPLANT
KIT TURNOVER KIT B (KITS) ×2 IMPLANT
NS IRRIG 1000ML POUR BTL (IV SOLUTION) ×2 IMPLANT
PACK CV ACCESS (CUSTOM PROCEDURE TRAY) ×2 IMPLANT
PAD ARMBOARD 7.5X6 YLW CONV (MISCELLANEOUS) ×4 IMPLANT
SUT MNCRL AB 4-0 PS2 18 (SUTURE) ×2 IMPLANT
SUT PROLENE 6 0 BV (SUTURE) ×2 IMPLANT
SUT PROLENE 7 0 BV 1 (SUTURE) IMPLANT
SUT VIC AB 3-0 SH 27 (SUTURE) ×2
SUT VIC AB 3-0 SH 27X BRD (SUTURE) ×1 IMPLANT
TOWEL GREEN STERILE (TOWEL DISPOSABLE) ×2 IMPLANT
UNDERPAD 30X30 (UNDERPADS AND DIAPERS) ×2 IMPLANT
WATER STERILE IRR 1000ML POUR (IV SOLUTION) ×2 IMPLANT

## 2018-08-28 NOTE — Transfer of Care (Addendum)
Immediate Anesthesia Transfer of Care Note  Patient: Justin [name]  Procedure(s) Performed: ARTERIOVENOUS (AV) BRACHIOCEPHALIC FISTULA CREATION LEFT UPPER ARM (Left Arm Upper)  Patient Location: PACU  Anesthesia Type:MAC  Level of Consciousness: awake, alert , oriented and sedated  Airway & Oxygen Therapy: Patient Spontanous Breathing and Patient connected to nasal cannula oxygen  Post-op Assessment: Report given to RN, Post -op Vital signs reviewed and stable and Patient moving all extremities  Post vital signs: Reviewed and stable  Last Vitals:  Vitals Value Taken Time  BP 124/59 08/28/18 0902  Temp 36.6 C 08/28/18 0845  Pulse 59 08/28/18 0911  Resp 17 08/28/18 0911  SpO2 96 % 08/28/18 0911  Vitals shown include unvalidated device data.  Last Pain:  Vitals:   08/28/18 0850  PainSc: 0-No pain         Complications: No apparent anesthesia complications

## 2018-08-28 NOTE — Discharge Instructions (Addendum)
° °  [facility]  Discharge Instructions  AV Fistula or Graft Surgery for Dialysis Access  Please refer to the following instructions for your post-procedure care. Your surgeon or physician assistant will discuss any changes with you.  Activity  You may drive the day following your surgery, if you are comfortable and no longer taking prescription pain medication. Resume full activity as the soreness in your incision resolves.  Bathing/Showering  You may shower after you go home. Keep your incision dry for 48 hours. Do not soak in a bathtub, hot tub, or swim until the incision heals completely. You may not shower if you have a hemodialysis catheter.  Incision Care  Clean your incision with mild soap and water after 48 hours. Pat the area dry with a clean towel. You do not need a bandage unless otherwise instructed. Do not apply any ointments or creams to your incision. You may have skin glue on your incision. Do not peel it off. It will come off on its own in about one week. Your arm may swell a bit after surgery. To reduce swelling use pillows to elevate your arm so it is above your heart. Your doctor will tell you if you need to lightly wrap your arm with an ACE bandage.  Diet  Resume your normal diet. There are not special food restrictions following this procedure. In order to heal from your surgery, it is CRITICAL to get adequate nutrition. Your body requires vitamins, minerals, and protein. Vegetables are the best source of vitamins and minerals. Vegetables also provide the perfect balance of protein. Processed food has little nutritional value, so try to avoid this.  Medications  Resume taking all of your medications. If your incision is causing pain, you may take over-the counter pain relievers such as acetaminophen (Tylenol). If you were prescribed a stronger pain medication, please be aware these medications can cause nausea and constipation. Prevent  nausea by taking the medication with a snack or meal. Avoid constipation by drinking plenty of fluids and eating foods with high amount of fiber, such as fruits, vegetables, and grains.  Do not take Tylenol if you are taking prescription pain medications.  Follow up Your surgeon may want to see you in the office following your access surgery. If so, this will be arranged at the time of your surgery.  Please call us immediately for any of the following conditions:  Increased pain, redness, drainage (pus) from your incision site Fever of 101 degrees or higher Severe or worsening pain at your incision site Hand pain or numbness.  Reduce your risk of vascular disease:  Stop smoking. If you would like help, call [facility] at 1-800-QUIT-NOW 775-112-7070) or [facility] [place] your cholesterol Maintain a desired weight Control your diabetes Keep your blood pressure down  Dialysis  It will take several weeks to several months for your new dialysis access to be ready for use. Your surgeon will determine when it is okay to use it. Your nephrologist will continue to direct your dialysis. You can continue to use your Permcath until your new access is ready for use.   08/28/2018 JESSON SENSKE [id number] [birth year]  Surgeon(s): Marty Heck, MD  Procedure(s): ARTERIOVENOUS (AV) BRACHIOCEPHALIC FISTULA CREATION LEFT UPPER ARM  x Do not stick fistula for 12 weeks    If you have any questions, please call the office at [phone].

## 2018-08-28 NOTE — H&P (Signed)
History and Physical Interval Note:  08/28/2018 7:13 AM  Justin [name]  has presented today for surgery, with the diagnosis of end stage renal disease.  The various methods of treatment have been discussed with the patient and family. After consideration of risks, benefits and other options for treatment, the patient has consented to  Procedure(s): ARTERIOVENOUS (AV) BRACHIOCEPHALIC FISTULA CREATION (Left) as a surgical intervention.  The patient's history has been reviewed, patient examined, no change in status, stable for surgery.  I have reviewed the patient's chart and labs.  Questions were answered to the patient's satisfaction.    Left arm AVF  Justin [name]  CC: left forearm AVF not usable for hemodialysis  History of Present Illness  Justin [name] is a 68 y.o. ([birth year]) male whois s/pLeft Radial Cephalic AV fistulacreation on 04-30-18 by Dr. Oneida Alar.  He returns today with report that his left forearm AVF is not usable for hemodialysis.   He dialyzes T-TH-S via right IJ TDC.  He denies steal type sx's in his left hand.   He denies any problems with his legs.  He isrighthand dominant.        Past Medical History:  Diagnosis Date  . BPH (benign prostatic hyperplasia)   . Chronic kidney disease (CKD), stage IV (severe) ([place])   . Diabetes (Newborn)   . Dysuria   . Erectile dysfunction   . Hyperlipemia   . Hypertension   . Pinna disorder, left   . Renal disorder     Social History Social History       Tobacco Use  . Smoking status: Never Smoker  . Smokeless tobacco: Never Used  Substance Use Topics  . Alcohol use: Not Currently  . Drug use: Never    Family History      Family History  Problem Relation Age of Onset  . Stroke Mother   . Diabetes Father     Surgical History      Past Surgical History:  Procedure Laterality Date  . AV FISTULA PLACEMENT Left 04/30/2018   Procedure: CREATION OF RADIOCEPHALIC ARTERIOVENOUS  FISTULA LEFT ARM;  Surgeon: Elam Dutch, MD;  Location: [place];  Service: Vascular;  Laterality: Left;  . IR FLUORO GUIDE CV LINE RIGHT  04/29/2018  . IR US GUIDE VASC ACCESS RIGHT  04/29/2018  . RIGHT/LEFT HEART CATH AND CORONARY ANGIOGRAPHY N/A 05/01/2018   Procedure: RIGHT/LEFT HEART CATH AND CORONARY ANGIOGRAPHY;  Surgeon: Belva Crome, MD;  Location: [facility];  Service: Cardiovascular;  Laterality: N/A;    No Known Allergies        Current Outpatient Medications  Medication Sig Dispense Refill  . aspirin EC 81 MG EC tablet Take 1 tablet (81 mg total) by mouth daily. 30 tablet 0  . hydrALAZINE (APRESOLINE) 25 MG tablet Take 1.5 tablets (37.5 mg total) by mouth 3 (three) times daily. 90 tablet 0  . isosorbide mononitrate (IMDUR) 30 MG 24 hr tablet Take 1 tablet (30 mg total) by mouth daily. 30 tablet 0  . metoprolol succinate (TOPROL-XL) 50 MG 24 hr tablet Take 1 tablet (50 mg total) by mouth daily. Take with or immediately following a meal. 30 tablet 0  . NOVOLIN 70/30 RELION (70-30) 100 UNIT/ML injection Inject 20 Units into the skin daily. 10 mL 0  . rosuvastatin (CRESTOR) 40 MG tablet Take 1 tablet (40 mg total) by mouth daily. 30 tablet 0   No current facility-administered medications for this visit.  REVIEW OF SYSTEMS: see HPI for pertinent positives and negatives    PHYSICAL EXAMINATION:     Vitals:   08/15/18 1345  BP: (!) 145/65  Pulse: 65  Resp: 18  Temp: (!) 96.9 F (36.1 C)  TempSrc: Temporal  SpO2: 99%  Weight: 195 lb (88.5 kg)  Height: 5\' 9"  (1.753 m)   Body mass index is 28.8 kg/m.  General: The patient appears his stated age 68:  No gross abnormalities, full facial beard.    Pulmonary: Respirations are non-labored, CTAB, no rales, rhonchi, or wheezes Abdomen: Soft and non-tender with normal bowel sounds. Musculoskeletal: There are no major deformities.   Neurologic: No focal weakness or paresthesias are detected,  bilateral hand grip strength is 5/5. Skin: There are no ulcer or rashes noted. Psychiatric: The patient has normal affect. Cardiovascular: There is a regular rate and rhythm without significant murmur appreciated.  Left forearm AVF with no palpable thrill, no audible bruit.    Non-Invasive Vascular Imaging  Leftforearm Access Duplex(Date:07/10/2018): Findings: +--------------------+----------+-----------------+--------+ AVF PSV (cm/s)Flow Vol (mL/min)Comments +--------------------+----------+-----------------+--------+ Native artery inflow 293  1225   +--------------------+----------+-----------------+--------+ AVF Anastomosis  512    +--------------------+----------+-----------------+--------+  +------------+----------+-------------+----------+----------------+ OUTFLOW VEINPSV (cm/s)Diameter (cm)Depth (cm) Describe  +------------+----------+-------------+----------+----------------+ AC Fossa  57  0.50  0.26 competing branch +------------+----------+-------------+----------+----------------+ Prox Forearm 96  0.49  0.19   +------------+----------+-------------+----------+----------------+ Mid Forearm  120  0.40  0.22 competing branch +------------+----------+-------------+----------+----------------+ Dist Forearm 470  0.41  0.26   +------------+----------+-------------+----------+----------------+  Summary: Arteriovenous fistula-Velocities less than 100cm/s noted in the antecubital fossa and proximal forearm. Patent left radiocephalic fistula without evidence of hemodynamically significant stenosis.   Vein Mapping (04-28-18): +-----------------+-------------+----------+--------+ Right Cephalic Diameter (cm)Depth  (cm)Findings +-----------------+-------------+----------+--------+ Shoulder  0.47  1.29   +-----------------+-------------+----------+--------+ Prox upper arm  0.45  0.89   +-----------------+-------------+----------+--------+ Mid upper arm  0.42  0.31   +-----------------+-------------+----------+--------+ Dist upper arm  0.43  0.37   +-----------------+-------------+----------+--------+ Antecubital fossa 0.47  0.30   +-----------------+-------------+----------+--------+ Prox forearm  0.36  0.38   +-----------------+-------------+----------+--------+ Mid forearm  0.30  0.16   +-----------------+-------------+----------+--------+ Dist forearm  0.29  0.21   +-----------------+-------------+----------+--------+ Wrist  0.31  0.19   +-----------------+-------------+----------+--------+  +-----------------+-------------+----------+--------+ Right Basilic Diameter (cm)Depth (cm)Findings +-----------------+-------------+----------+--------+ Prox upper arm  0.62  1.67   +-----------------+-------------+----------+--------+ Mid upper arm  0.52  1.04   +-----------------+-------------+----------+--------+ Dist upper arm  0.46  0.39   +-----------------+-------------+----------+--------+ Antecubital fossa 0.29  0.34   +-----------------+-------------+----------+--------+ Prox forearm  0.22  0.16   +-----------------+-------------+----------+--------+ Mid forearm  0.23  0.15   +-----------------+-------------+----------+--------+ Distal forearm  0.20  0.17    +-----------------+-------------+----------+--------+  +-----------------+-------------+----------+--------+ Left Cephalic Diameter (cm)Depth (cm)Findings +-----------------+-------------+----------+--------+ Shoulder  0.49  1.46   +-----------------+-------------+----------+--------+ Prox upper arm  0.56  1.05   +-----------------+-------------+----------+--------+ Mid upper arm  0.48  0.56   +-----------------+-------------+----------+--------+ Dist upper arm  0.50  0.49   +-----------------+-------------+----------+--------+ Antecubital fossa 0.36  0.46   +-----------------+-------------+----------+--------+ Prox forearm  0.42  0.54   +-----------------+-------------+----------+--------+ Mid forearm  0.34  0.26   +-----------------+-------------+----------+--------+ Dist forearm  0.36  0.19   +-----------------+-------------+----------+--------+ Wrist  0.39  0.83   +-----------------+-------------+----------+--------+  +-----------------+-------------+----------+--------+ Left Basilic Diameter (cm)Depth (cm)Findings +-----------------+-------------+----------+--------+ Prox upper arm  0.40  0.63   +-----------------+-------------+----------+--------+ Mid upper arm  0.40  0.58   +-----------------+-------------+----------+--------+ Dist upper arm  0.37  0.63   +-----------------+-------------+----------+--------+ Antecubital fossa 0.37  0.26   +-----------------+-------------+----------+--------+ Prox forearm  0.28  0.22   +-----------------+-------------+----------+--------+ Mid  forearm  0.28  0.21   +-----------------+-------------+----------+--------+ Distal forearm  0.28  0.23   +-----------------+-------------+----------+--------+    Medical Decision Making  Justin [name] is a 68 y.o. male whois s/pLeft Radial Cephalic AV fistulacreation on 04-30-18 by Dr. Oneida Alar.  Pt states that his dialysis center has tried accessing his left forearm AVF and they are unable to dialyze using this. He dialyzes TTS via right IJ TDC.    He has no steal symptoms in his left upper extremity.   Left forearm AVF has no bruit and no thrill, is occluded.  I discussed with Dr. Oneida Alar who reviewed the vein mapping from April 2020.  Will schedule for left brachiocephalic AVF creation as soon as possible on a non HD day.    [name], RN, MSN, FNP-C [facility] [place] Office: [phone]  08/15/2018, 1:48 PM  Clinic MD: Oneida Alar

## 2018-08-28 NOTE — Op Note (Addendum)
OPERATIVE NOTE   PROCEDURE: left brachiocephalic arteriovenous fistula placement  PRE-OPERATIVE DIAGNOSIS: ESRD  POST-OPERATIVE DIAGNOSIS: same as above   SURGEON: Marty Heck, MD  ASSISTANT(S): Leontine Locket, PA  ANESTHESIA: MAC  ESTIMATED BLOOD LOSS: Minimal  FINDING(S): 1.  Cephalic vein: 4 mm, acceptable 2.  Brachial artery: 4 mm, disease free 3.  Venous outflow: palpable thrill  4.  Radial flow: palpable radial pulse  SPECIMEN(S):  none  INDICATIONS:   Justin [name] is a 68 y.o. male who was recently seen in clinic by NP with a thrombosed left radiocephalic AVF placed by Dr. Oneida Alar.  The decision was made to place a new fistula higher on his left arm.  The patient is scheduled for left brachiocephalic arteriovenous fistula placement.  The patient is aware the risks include but are not limited to: bleeding, infection, steal syndrome, nerve damage, ischemic monomelic neuropathy, failure to mature, and need for additional procedures.  The patient is aware of the risks of the procedure and elects to proceed forward.   DESCRIPTION: After full informed written consent was obtained from the patient, the patient was brought back to the operating room and placed supine upon the operating table.  Prior to induction, the patient received IV antibiotics.   After obtaining adequate anesthesia, the patient was then prepped and draped in the standard fashion for a left arm access procedure.  I turned my attention first to identifying the patient's cephalic vein and brachial artery.  Using SonoSite guidance, the location of these vessels were marked out on the skin just above the elbow.     At this point, I injected local anesthetic to obtain a field block of the antecubitum.  In total, I injected about 10 mL of 1% lidocaine without epinephrine.  I made a transverse incision just above the level of the antecubitum and dissected through the subcutaneous tissue and fascia to gain  exposure of the brachial artery.  This was noted to be 4 mm in diameter externally.  This was dissected out proximally and distally and controlled with vessel loops .  I then dissected out the cephalic vein.  This was noted to be 4 mm in diameter externally.  The distal segment of the vein was ligated with a  2-0 silk, and the vein was transected.  The proximal segment was interrogated with serial dilators.  The vein accepted up to a 5 mm dilator without any difficulty.  I then instilled the heparinized saline into the vein and clamped it.  The patient was given 5000 units of IV heparin.  At this point, I reset my exposure of the brachial artery and placed the artery under tension proximally and distally.  I made an arteriotomy with a #11 blade, and then I extended the arteriotomy with a Potts scissor.  I injected heparinized saline proximal and distal to this arteriotomy.  The vein was then sewn to the artery in an end-to-side configuration with a running stitch of 6-0 Prolene.  Prior to completing this anastomosis, I allowed the vein and artery to backbleed.  There was no evidence of clot from any vessels.  I completed the anastomosis in the usual fashion and then released all vessel loops and clamps.    There was a palpable thrill in the venous outflow, and there was a palpable radial pulse.  At this point, I irrigated out the surgical wound.  There was no further active bleeding.  The subcutaneous tissue was reapproximated with a running  stitch of 3-0 Vicryl.  The skin was then reapproximated with a running subcuticular stitch of 4-0 Monocryl.  The skin was then cleaned, dried, and reinforced with Dermabond.  The patient tolerated this procedure well.   COMPLICATIONS: None  CONDITION: Stable  Marty Heck, MD [facility] [place] Office: [phone] Pager: [phone]  08/28/2018, 8:35 AM

## 2018-08-28 NOTE — Anesthesia Postprocedure Evaluation (Signed)
Anesthesia Post Note  Patient: Justin [name]  Procedure(s) Performed: ARTERIOVENOUS (AV) BRACHIOCEPHALIC FISTULA CREATION LEFT UPPER ARM (Left Arm Upper)     Patient location during evaluation: PACU Anesthesia Type: MAC Level of consciousness: awake Pain management: pain level controlled Vital Signs Assessment: post-procedure vital signs reviewed and stable Respiratory status: spontaneous breathing Cardiovascular status: stable Postop Assessment: no apparent nausea or vomiting Anesthetic complications: no    Last Vitals:  Vitals:   08/28/18 0935 08/28/18 0950  BP: (!) 125/57 135/62  Pulse: (!) 59 63  Resp: 18 17  Temp:  36.6 C  SpO2: 97% 98%    Last Pain:  Vitals:   08/28/18 0950  PainSc: 0-No pain                 Javious Hallisey

## 2018-08-29 ENCOUNTER — Telehealth (INDEPENDENT_AMBULATORY_CARE_PROVIDER_SITE_OTHER): Payer: Medicare HMO | Admitting: Cardiology

## 2018-08-29 ENCOUNTER — Encounter (HOSPITAL_COMMUNITY): Payer: Self-pay | Admitting: Vascular Surgery

## 2018-08-29 VITALS — Ht 69.0 in | Wt 188.0 lb

## 2018-08-29 DIAGNOSIS — I10 Essential (primary) hypertension: Secondary | ICD-10-CM

## 2018-08-29 DIAGNOSIS — I350 Nonrheumatic aortic (valve) stenosis: Secondary | ICD-10-CM

## 2018-08-29 DIAGNOSIS — E7800 Pure hypercholesterolemia, unspecified: Secondary | ICD-10-CM

## 2018-08-29 DIAGNOSIS — I2510 Atherosclerotic heart disease of native coronary artery without angina pectoris: Secondary | ICD-10-CM | POA: Diagnosis not present

## 2018-08-29 DIAGNOSIS — N186 End stage renal disease: Secondary | ICD-10-CM | POA: Diagnosis not present

## 2018-08-29 DIAGNOSIS — N2581 Secondary hyperparathyroidism of renal origin: Secondary | ICD-10-CM | POA: Diagnosis not present

## 2018-08-29 DIAGNOSIS — Z992 Dependence on renal dialysis: Secondary | ICD-10-CM | POA: Diagnosis not present

## 2018-08-29 MED ORDER — ROSUVASTATIN CALCIUM 40 MG PO TABS: 40.0000 mg | ORAL_TABLET | Freq: Every day | ORAL | 3 refills | Status: DC

## 2018-08-29 NOTE — Patient Instructions (Signed)
Medication Instructions:  INCREASE ROSUVASTATIN TO 40 MG ONCE DAILY  If you need a refill on your cardiac medications before your next appointment, please call your pharmacy.   Lab work: Your physician recommends that you return for lab work in: [place]  If you have labs (blood work) drawn today and your tests are completely normal, you will receive your results only by: [name] Kitchen MyChart Message (if you have MyChart) OR . A paper copy in the mail If you have any lab test that is abnormal or we need to change your treatment, we will call you to review the results.  Testing/Procedures: Your physician has requested that you have an echocardiogram. Echocardiography is a painless test that uses sound waves to create images of your heart. It provides your doctor with information about the size and shape of your heart and how well your heart's chambers and valves are working. This procedure takes approximately one hour. There are no restrictions for this procedure.[place]    Follow-Up: At [facility], you and your health needs are our priority.  As part of our continuing mission to provide you with exceptional heart care, we have created designated Provider Care Teams.  These Care Teams include your primary Cardiologist (physician) and Advanced Practice Providers (APPs -  Physician Assistants and Nurse Practitioners) who all work together to provide you with the care you need, when you need it. You will need a follow up appointment in 6 months.  Please call our office 2 months in advance to schedule this appointment.  You may see Kirk Ruths, MD or one of the following Advanced Practice Providers on your designated Care Team:   Kerin Ransom, PA-C Roby Lofts, [place] . Sande Rives, PA-C

## 2018-08-31 DIAGNOSIS — N2581 Secondary hyperparathyroidism of renal origin: Secondary | ICD-10-CM | POA: Diagnosis not present

## 2018-08-31 DIAGNOSIS — N186 End stage renal disease: Secondary | ICD-10-CM | POA: Diagnosis not present

## 2018-08-31 DIAGNOSIS — Z992 Dependence on renal dialysis: Secondary | ICD-10-CM | POA: Diagnosis not present

## 2018-09-02 DIAGNOSIS — E1129 Type 2 diabetes mellitus with other diabetic kidney complication: Secondary | ICD-10-CM | POA: Diagnosis not present

## 2018-09-02 DIAGNOSIS — N186 End stage renal disease: Secondary | ICD-10-CM | POA: Diagnosis not present

## 2018-09-02 DIAGNOSIS — Z992 Dependence on renal dialysis: Secondary | ICD-10-CM | POA: Diagnosis not present

## 2018-09-03 DIAGNOSIS — N186 End stage renal disease: Secondary | ICD-10-CM | POA: Diagnosis not present

## 2018-09-03 DIAGNOSIS — Z992 Dependence on renal dialysis: Secondary | ICD-10-CM | POA: Diagnosis not present

## 2018-09-03 DIAGNOSIS — N2581 Secondary hyperparathyroidism of renal origin: Secondary | ICD-10-CM | POA: Diagnosis not present

## 2018-09-04 ENCOUNTER — Ambulatory Visit (HOSPITAL_COMMUNITY): Payer: Medicare HMO | Attending: Cardiology

## 2018-09-05 ENCOUNTER — Encounter (HOSPITAL_COMMUNITY): Payer: Self-pay | Admitting: Cardiology

## 2018-09-05 DIAGNOSIS — Z992 Dependence on renal dialysis: Secondary | ICD-10-CM | POA: Diagnosis not present

## 2018-09-05 DIAGNOSIS — N2581 Secondary hyperparathyroidism of renal origin: Secondary | ICD-10-CM | POA: Diagnosis not present

## 2018-09-05 DIAGNOSIS — N186 End stage renal disease: Secondary | ICD-10-CM | POA: Diagnosis not present

## 2018-09-07 DIAGNOSIS — Z992 Dependence on renal dialysis: Secondary | ICD-10-CM | POA: Diagnosis not present

## 2018-09-07 DIAGNOSIS — N186 End stage renal disease: Secondary | ICD-10-CM | POA: Diagnosis not present

## 2018-09-07 DIAGNOSIS — N2581 Secondary hyperparathyroidism of renal origin: Secondary | ICD-10-CM | POA: Diagnosis not present

## 2018-09-10 DIAGNOSIS — N2581 Secondary hyperparathyroidism of renal origin: Secondary | ICD-10-CM | POA: Diagnosis not present

## 2018-09-10 DIAGNOSIS — N186 End stage renal disease: Secondary | ICD-10-CM | POA: Diagnosis not present

## 2018-09-10 DIAGNOSIS — Z992 Dependence on renal dialysis: Secondary | ICD-10-CM | POA: Diagnosis not present

## 2018-09-12 DIAGNOSIS — N2581 Secondary hyperparathyroidism of renal origin: Secondary | ICD-10-CM | POA: Diagnosis not present

## 2018-09-12 DIAGNOSIS — Z992 Dependence on renal dialysis: Secondary | ICD-10-CM | POA: Diagnosis not present

## 2018-09-12 DIAGNOSIS — N186 End stage renal disease: Secondary | ICD-10-CM | POA: Diagnosis not present

## 2018-09-14 DIAGNOSIS — N186 End stage renal disease: Secondary | ICD-10-CM | POA: Diagnosis not present

## 2018-09-14 DIAGNOSIS — N2581 Secondary hyperparathyroidism of renal origin: Secondary | ICD-10-CM | POA: Diagnosis not present

## 2018-09-14 DIAGNOSIS — Z992 Dependence on renal dialysis: Secondary | ICD-10-CM | POA: Diagnosis not present

## 2018-09-17 DIAGNOSIS — N2581 Secondary hyperparathyroidism of renal origin: Secondary | ICD-10-CM | POA: Diagnosis not present

## 2018-09-17 DIAGNOSIS — Z992 Dependence on renal dialysis: Secondary | ICD-10-CM | POA: Diagnosis not present

## 2018-09-17 DIAGNOSIS — N186 End stage renal disease: Secondary | ICD-10-CM | POA: Diagnosis not present

## 2018-09-19 DIAGNOSIS — N186 End stage renal disease: Secondary | ICD-10-CM | POA: Diagnosis not present

## 2018-09-19 DIAGNOSIS — Z992 Dependence on renal dialysis: Secondary | ICD-10-CM | POA: Diagnosis not present

## 2018-09-19 DIAGNOSIS — N2581 Secondary hyperparathyroidism of renal origin: Secondary | ICD-10-CM | POA: Diagnosis not present

## 2018-09-21 DIAGNOSIS — Z992 Dependence on renal dialysis: Secondary | ICD-10-CM | POA: Diagnosis not present

## 2018-09-21 DIAGNOSIS — N2581 Secondary hyperparathyroidism of renal origin: Secondary | ICD-10-CM | POA: Diagnosis not present

## 2018-09-21 DIAGNOSIS — N186 End stage renal disease: Secondary | ICD-10-CM | POA: Diagnosis not present

## 2018-09-24 DIAGNOSIS — Z992 Dependence on renal dialysis: Secondary | ICD-10-CM | POA: Diagnosis not present

## 2018-09-24 DIAGNOSIS — N186 End stage renal disease: Secondary | ICD-10-CM | POA: Diagnosis not present

## 2018-09-24 DIAGNOSIS — N2581 Secondary hyperparathyroidism of renal origin: Secondary | ICD-10-CM | POA: Diagnosis not present

## 2018-09-26 DIAGNOSIS — N186 End stage renal disease: Secondary | ICD-10-CM | POA: Diagnosis not present

## 2018-09-26 DIAGNOSIS — Z992 Dependence on renal dialysis: Secondary | ICD-10-CM | POA: Diagnosis not present

## 2018-09-26 DIAGNOSIS — N2581 Secondary hyperparathyroidism of renal origin: Secondary | ICD-10-CM | POA: Diagnosis not present

## 2018-09-28 DIAGNOSIS — Z992 Dependence on renal dialysis: Secondary | ICD-10-CM | POA: Diagnosis not present

## 2018-09-28 DIAGNOSIS — N186 End stage renal disease: Secondary | ICD-10-CM | POA: Diagnosis not present

## 2018-09-28 DIAGNOSIS — N2581 Secondary hyperparathyroidism of renal origin: Secondary | ICD-10-CM | POA: Diagnosis not present

## 2018-10-01 DIAGNOSIS — N186 End stage renal disease: Secondary | ICD-10-CM | POA: Diagnosis not present

## 2018-10-01 DIAGNOSIS — Z992 Dependence on renal dialysis: Secondary | ICD-10-CM | POA: Diagnosis not present

## 2018-10-01 DIAGNOSIS — N2581 Secondary hyperparathyroidism of renal origin: Secondary | ICD-10-CM | POA: Diagnosis not present

## 2018-10-02 DIAGNOSIS — Z992 Dependence on renal dialysis: Secondary | ICD-10-CM | POA: Diagnosis not present

## 2018-10-02 DIAGNOSIS — E1129 Type 2 diabetes mellitus with other diabetic kidney complication: Secondary | ICD-10-CM | POA: Diagnosis not present

## 2018-10-02 DIAGNOSIS — N186 End stage renal disease: Secondary | ICD-10-CM | POA: Diagnosis not present

## 2018-10-03 ENCOUNTER — Other Ambulatory Visit: Payer: Self-pay

## 2018-10-03 DIAGNOSIS — N2581 Secondary hyperparathyroidism of renal origin: Secondary | ICD-10-CM | POA: Diagnosis not present

## 2018-10-03 DIAGNOSIS — N186 End stage renal disease: Secondary | ICD-10-CM

## 2018-10-03 DIAGNOSIS — Z992 Dependence on renal dialysis: Secondary | ICD-10-CM

## 2018-10-05 DIAGNOSIS — N186 End stage renal disease: Secondary | ICD-10-CM | POA: Diagnosis not present

## 2018-10-05 DIAGNOSIS — Z992 Dependence on renal dialysis: Secondary | ICD-10-CM | POA: Diagnosis not present

## 2018-10-05 DIAGNOSIS — N2581 Secondary hyperparathyroidism of renal origin: Secondary | ICD-10-CM | POA: Diagnosis not present

## 2018-10-08 ENCOUNTER — Ambulatory Visit (HOSPITAL_COMMUNITY)
Admission: RE | Admit: 2018-10-08 | Discharge: 2018-10-08 | Disposition: A | Discharge status: Home / Self Care | Payer: Medicare HMO | Source: Ambulatory Visit | Attending: Vascular Surgery | Admitting: Vascular Surgery

## 2018-10-08 ENCOUNTER — Encounter: Payer: Medicare HMO | Admitting: Vascular Surgery

## 2018-10-08 ENCOUNTER — Ambulatory Visit (HOSPITAL_COMMUNITY): Payer: Medicare HMO

## 2018-10-08 ENCOUNTER — Encounter: Payer: Self-pay | Admitting: Vascular Surgery

## 2018-10-08 ENCOUNTER — Other Ambulatory Visit: Payer: Self-pay

## 2018-10-08 ENCOUNTER — Ambulatory Visit (INDEPENDENT_AMBULATORY_CARE_PROVIDER_SITE_OTHER): Payer: Self-pay | Admitting: Vascular Surgery

## 2018-10-08 VITALS — BP 145/56 | HR 63 | Temp 97.1°F | Resp 18 | Ht 69.0 in | Wt 194.0 lb

## 2018-10-08 DIAGNOSIS — N186 End stage renal disease: Secondary | ICD-10-CM | POA: Insufficient documentation

## 2018-10-08 DIAGNOSIS — N2581 Secondary hyperparathyroidism of renal origin: Secondary | ICD-10-CM | POA: Diagnosis not present

## 2018-10-08 DIAGNOSIS — Z992 Dependence on renal dialysis: Secondary | ICD-10-CM

## 2018-10-08 NOTE — Progress Notes (Signed)
Patient name: ALEKXANDER GROSSBERG MRN: [id number] DOB: 1950/06/03 Sex: male  REASON FOR VISIT: Postop check after left brachiocephalic AV fistula  HPI: ZACHARAY DRAGONE is a 68 y.o. male with end-stage renal disease that presents for postop check after left brachiocephalic AV fistula.  He previously had a left radiocephalic that failed after placement by Dr. Oneida Alar.  I subsequently placed a brachiocephalic fistula on [id number].  He reports no issues with his hand in particular no numbness or tingling.  He has recovered well from surgery.  No issues with the incision.  Past Medical History:  Diagnosis Date  . BPH (benign prostatic hyperplasia)   . Chronic kidney disease (CKD), stage IV (severe) ([place])   . Diabetes ([place])   . Dysuria   . Erectile dysfunction   . Hyperlipemia   . Hypertension   . Pinna disorder, left   . Renal disorder   . Wears glasses     Past Surgical History:  Procedure Laterality Date  . AV FISTULA PLACEMENT Left 04/30/2018   Procedure: CREATION OF RADIOCEPHALIC ARTERIOVENOUS FISTULA LEFT ARM;  Surgeon: Elam Dutch, MD;  Location: [facility];  Service: Vascular;  Laterality: Left;  . AV FISTULA PLACEMENT Left 08/28/2018   Procedure: ARTERIOVENOUS (AV) BRACHIOCEPHALIC FISTULA CREATION LEFT UPPER ARM;  Surgeon: Marty Heck, MD;  Location: [facility];  Service: Vascular;  Laterality: Left;  . IR FLUORO GUIDE CV LINE RIGHT  04/29/2018  . IR US GUIDE VASC ACCESS RIGHT  04/29/2018  . RIGHT/LEFT HEART CATH AND CORONARY ANGIOGRAPHY N/A 05/01/2018   Procedure: RIGHT/LEFT HEART CATH AND CORONARY ANGIOGRAPHY;  Surgeon: Belva Crome, MD;  Location: [facility];  Service: Cardiovascular;  Laterality: N/A;    Family History  Problem Relation Age of Onset  . Stroke Mother   . Diabetes Father     SOCIAL HISTORY: Social History   Tobacco Use  . Smoking status: Never Smoker  . Smokeless tobacco: Never Used  Substance Use Topics  . Alcohol use: Not Currently    No  Known Allergies  Current Outpatient Medications  Medication Sig Dispense Refill  . acetaminophen (TYLENOL) 325 MG tablet Take 650 mg by mouth every 6 (six) hours as needed (pain).    [name] Kitchen aspirin EC 81 MG EC tablet Take 1 tablet (81 mg total) by mouth daily. 30 tablet 0  . hydrALAZINE (APRESOLINE) 25 MG tablet Take 1.5 tablets (37.5 mg total) by mouth 3 (three) times daily. (Patient taking differently: Take 37.5 mg by mouth 3 (three) times daily with meals. ) 90 tablet 0  . isosorbide mononitrate (IMDUR) 30 MG 24 hr tablet Take 1 tablet (30 mg total) by mouth daily. 30 tablet 0  . metoprolol succinate (TOPROL-XL) 50 MG 24 hr tablet Take 1 tablet (50 mg total) by mouth daily. Take with or immediately following a meal. 30 tablet 0  . NOVOLIN 70/30 RELION (70-30) 100 UNIT/ML injection Inject 20 Units into the skin daily. 10 mL 0  . oxyCODONE (ROXICODONE) 5 MG immediate release tablet Take 1 tablet (5 mg total) by mouth every 6 (six) hours as needed. 6 tablet 0  . rosuvastatin (CRESTOR) 40 MG tablet Take 1 tablet (40 mg total) by mouth daily. 90 tablet 3  . sevelamer carbonate (RENVELA) 800 MG tablet Take 2,400 mg by mouth 3 (three) times daily with meals.      No current facility-administered medications for this visit.     REVIEW OF SYSTEMS:  [X]  denotes positive finding, [ ]   denotes negative finding Cardiac  Comments:  Chest pain or chest pressure:    Shortness of breath upon exertion:    Short of breath when lying flat:    Irregular heart rhythm:        Vascular    Pain in calf, thigh, or hip brought on by ambulation:    Pain in feet at night that wakes you up from your sleep:     Blood clot in your veins:    Leg swelling:         Pulmonary    Oxygen at home:    Productive cough:     Wheezing:         Neurologic    Sudden weakness in arms or legs:     Sudden numbness in arms or legs:     Sudden onset of difficulty speaking or slurred speech:    Temporary loss of vision in one  eye:     Problems with dizziness:         Gastrointestinal    Blood in stool:     Vomited blood:         Genitourinary    Burning when urinating:     Blood in urine:        Psychiatric    Major depression:         Hematologic    Bleeding problems:    Problems with blood clotting too easily:        Skin    Rashes or ulcers:        Constitutional    Fever or chills:      PHYSICAL EXAM: Vitals:   10/08/18 1405  BP: (!) 145/56  Pulse: 63  Resp: 18  Temp: (!) 97.1 F (36.2 C)  TempSrc: Temporal  SpO2: 99%  Weight: 194 lb (88 kg)  Height: 5\' 9"  (1.753 m)    GENERAL: The patient is a well-nourished male, in no acute distress. The vital signs are documented above. CARDIAC: There is a regular rate and rhythm.  VASCULAR:  Left brachiocephalic AV fistula good thrill Left radial pulse palpable.  DATA:   I independently reviewed his fistula duplex which shows patent AV fistula in the left arm  Assessment/Plan:  68 year old male status post left brachiocephalic AV fistula on [id number].  Fistula has a very nice thrill on exam today.  He is having no steal symptoms.  On duplex it looks to be maturing nicely and greater than 6 mm in diameter.  I discussed that I would be fine with dialysis accessing the fistula when he is 8 weeks postop toward the end of this month.  Certainly if this works well, hopefully he can get his catheter out.  Discussed that he call us with any questions or concerns.  Hopeful this work well for him.   Marty Heck, MD [facility] Office: [phone] Pager: [place]

## 2018-10-10 DIAGNOSIS — N2581 Secondary hyperparathyroidism of renal origin: Secondary | ICD-10-CM | POA: Diagnosis not present

## 2018-10-10 DIAGNOSIS — N186 End stage renal disease: Secondary | ICD-10-CM | POA: Diagnosis not present

## 2018-10-10 DIAGNOSIS — Z992 Dependence on renal dialysis: Secondary | ICD-10-CM | POA: Diagnosis not present

## 2018-10-12 DIAGNOSIS — Z992 Dependence on renal dialysis: Secondary | ICD-10-CM | POA: Diagnosis not present

## 2018-10-12 DIAGNOSIS — N186 End stage renal disease: Secondary | ICD-10-CM | POA: Diagnosis not present

## 2018-10-12 DIAGNOSIS — N2581 Secondary hyperparathyroidism of renal origin: Secondary | ICD-10-CM | POA: Diagnosis not present

## 2018-10-15 DIAGNOSIS — Z992 Dependence on renal dialysis: Secondary | ICD-10-CM | POA: Diagnosis not present

## 2018-10-15 DIAGNOSIS — N2581 Secondary hyperparathyroidism of renal origin: Secondary | ICD-10-CM | POA: Diagnosis not present

## 2018-10-15 DIAGNOSIS — N186 End stage renal disease: Secondary | ICD-10-CM | POA: Diagnosis not present

## 2018-10-17 DIAGNOSIS — Z992 Dependence on renal dialysis: Secondary | ICD-10-CM | POA: Diagnosis not present

## 2018-10-17 DIAGNOSIS — N2581 Secondary hyperparathyroidism of renal origin: Secondary | ICD-10-CM | POA: Diagnosis not present

## 2018-10-17 DIAGNOSIS — N186 End stage renal disease: Secondary | ICD-10-CM | POA: Diagnosis not present

## 2018-10-19 DIAGNOSIS — N2581 Secondary hyperparathyroidism of renal origin: Secondary | ICD-10-CM | POA: Diagnosis not present

## 2018-10-19 DIAGNOSIS — N186 End stage renal disease: Secondary | ICD-10-CM | POA: Diagnosis not present

## 2018-10-19 DIAGNOSIS — Z992 Dependence on renal dialysis: Secondary | ICD-10-CM | POA: Diagnosis not present

## 2018-10-22 DIAGNOSIS — Z992 Dependence on renal dialysis: Secondary | ICD-10-CM | POA: Diagnosis not present

## 2018-10-22 DIAGNOSIS — N2581 Secondary hyperparathyroidism of renal origin: Secondary | ICD-10-CM | POA: Diagnosis not present

## 2018-10-22 DIAGNOSIS — N186 End stage renal disease: Secondary | ICD-10-CM | POA: Diagnosis not present

## 2018-10-24 ENCOUNTER — Encounter: Payer: Self-pay | Admitting: *Deleted

## 2018-10-24 DIAGNOSIS — N186 End stage renal disease: Secondary | ICD-10-CM | POA: Diagnosis not present

## 2018-10-24 DIAGNOSIS — N2581 Secondary hyperparathyroidism of renal origin: Secondary | ICD-10-CM | POA: Diagnosis not present

## 2018-10-24 DIAGNOSIS — Z992 Dependence on renal dialysis: Secondary | ICD-10-CM | POA: Diagnosis not present

## 2018-10-26 DIAGNOSIS — Z992 Dependence on renal dialysis: Secondary | ICD-10-CM | POA: Diagnosis not present

## 2018-10-26 DIAGNOSIS — N2581 Secondary hyperparathyroidism of renal origin: Secondary | ICD-10-CM | POA: Diagnosis not present

## 2018-10-26 DIAGNOSIS — N186 End stage renal disease: Secondary | ICD-10-CM | POA: Diagnosis not present

## 2018-10-29 DIAGNOSIS — N186 End stage renal disease: Secondary | ICD-10-CM | POA: Diagnosis not present

## 2018-10-29 DIAGNOSIS — Z992 Dependence on renal dialysis: Secondary | ICD-10-CM | POA: Diagnosis not present

## 2018-10-29 DIAGNOSIS — N2581 Secondary hyperparathyroidism of renal origin: Secondary | ICD-10-CM | POA: Diagnosis not present

## 2018-10-31 DIAGNOSIS — N186 End stage renal disease: Secondary | ICD-10-CM | POA: Diagnosis not present

## 2018-10-31 DIAGNOSIS — Z992 Dependence on renal dialysis: Secondary | ICD-10-CM | POA: Diagnosis not present

## 2018-10-31 DIAGNOSIS — N2581 Secondary hyperparathyroidism of renal origin: Secondary | ICD-10-CM | POA: Diagnosis not present

## 2018-11-02 DIAGNOSIS — N2581 Secondary hyperparathyroidism of renal origin: Secondary | ICD-10-CM | POA: Diagnosis not present

## 2018-11-02 DIAGNOSIS — Z992 Dependence on renal dialysis: Secondary | ICD-10-CM | POA: Diagnosis not present

## 2018-11-02 DIAGNOSIS — N186 End stage renal disease: Secondary | ICD-10-CM | POA: Diagnosis not present

## 2018-11-02 DIAGNOSIS — E1129 Type 2 diabetes mellitus with other diabetic kidney complication: Secondary | ICD-10-CM | POA: Diagnosis not present

## 2018-11-05 DIAGNOSIS — Z992 Dependence on renal dialysis: Secondary | ICD-10-CM | POA: Diagnosis not present

## 2018-11-05 DIAGNOSIS — N186 End stage renal disease: Secondary | ICD-10-CM | POA: Diagnosis not present

## 2018-11-05 DIAGNOSIS — N2581 Secondary hyperparathyroidism of renal origin: Secondary | ICD-10-CM | POA: Diagnosis not present

## 2018-11-07 DIAGNOSIS — N186 End stage renal disease: Secondary | ICD-10-CM | POA: Diagnosis not present

## 2018-11-07 DIAGNOSIS — Z992 Dependence on renal dialysis: Secondary | ICD-10-CM | POA: Diagnosis not present

## 2018-11-07 DIAGNOSIS — N2581 Secondary hyperparathyroidism of renal origin: Secondary | ICD-10-CM | POA: Diagnosis not present

## 2018-11-09 DIAGNOSIS — N2581 Secondary hyperparathyroidism of renal origin: Secondary | ICD-10-CM | POA: Diagnosis not present

## 2018-11-09 DIAGNOSIS — Z992 Dependence on renal dialysis: Secondary | ICD-10-CM | POA: Diagnosis not present

## 2018-11-09 DIAGNOSIS — N186 End stage renal disease: Secondary | ICD-10-CM | POA: Diagnosis not present

## 2018-11-12 DIAGNOSIS — Z992 Dependence on renal dialysis: Secondary | ICD-10-CM | POA: Diagnosis not present

## 2018-11-12 DIAGNOSIS — N2581 Secondary hyperparathyroidism of renal origin: Secondary | ICD-10-CM | POA: Diagnosis not present

## 2018-11-12 DIAGNOSIS — N186 End stage renal disease: Secondary | ICD-10-CM | POA: Diagnosis not present

## 2018-11-14 DIAGNOSIS — N2581 Secondary hyperparathyroidism of renal origin: Secondary | ICD-10-CM | POA: Diagnosis not present

## 2018-11-14 DIAGNOSIS — Z992 Dependence on renal dialysis: Secondary | ICD-10-CM | POA: Diagnosis not present

## 2018-11-14 DIAGNOSIS — N186 End stage renal disease: Secondary | ICD-10-CM | POA: Diagnosis not present

## 2018-11-16 DIAGNOSIS — N2581 Secondary hyperparathyroidism of renal origin: Secondary | ICD-10-CM | POA: Diagnosis not present

## 2018-11-16 DIAGNOSIS — N186 End stage renal disease: Secondary | ICD-10-CM | POA: Diagnosis not present

## 2018-11-16 DIAGNOSIS — Z992 Dependence on renal dialysis: Secondary | ICD-10-CM | POA: Diagnosis not present

## 2018-11-19 DIAGNOSIS — N186 End stage renal disease: Secondary | ICD-10-CM | POA: Diagnosis not present

## 2018-11-19 DIAGNOSIS — Z992 Dependence on renal dialysis: Secondary | ICD-10-CM | POA: Diagnosis not present

## 2018-11-19 DIAGNOSIS — N2581 Secondary hyperparathyroidism of renal origin: Secondary | ICD-10-CM | POA: Diagnosis not present

## 2018-11-21 DIAGNOSIS — Z992 Dependence on renal dialysis: Secondary | ICD-10-CM | POA: Diagnosis not present

## 2018-11-21 DIAGNOSIS — N186 End stage renal disease: Secondary | ICD-10-CM | POA: Diagnosis not present

## 2018-11-21 DIAGNOSIS — N2581 Secondary hyperparathyroidism of renal origin: Secondary | ICD-10-CM | POA: Diagnosis not present

## 2018-11-23 DIAGNOSIS — N2581 Secondary hyperparathyroidism of renal origin: Secondary | ICD-10-CM | POA: Diagnosis not present

## 2018-11-23 DIAGNOSIS — N186 End stage renal disease: Secondary | ICD-10-CM | POA: Diagnosis not present

## 2018-11-23 DIAGNOSIS — Z992 Dependence on renal dialysis: Secondary | ICD-10-CM | POA: Diagnosis not present

## 2018-11-25 DIAGNOSIS — N2581 Secondary hyperparathyroidism of renal origin: Secondary | ICD-10-CM | POA: Diagnosis not present

## 2018-11-25 DIAGNOSIS — N186 End stage renal disease: Secondary | ICD-10-CM | POA: Diagnosis not present

## 2018-11-25 DIAGNOSIS — Z992 Dependence on renal dialysis: Secondary | ICD-10-CM | POA: Diagnosis not present

## 2018-11-27 DIAGNOSIS — N186 End stage renal disease: Secondary | ICD-10-CM | POA: Diagnosis not present

## 2018-11-27 DIAGNOSIS — N2581 Secondary hyperparathyroidism of renal origin: Secondary | ICD-10-CM | POA: Diagnosis not present

## 2018-11-27 DIAGNOSIS — Z992 Dependence on renal dialysis: Secondary | ICD-10-CM | POA: Diagnosis not present

## 2018-11-30 DIAGNOSIS — N186 End stage renal disease: Secondary | ICD-10-CM | POA: Diagnosis not present

## 2018-11-30 DIAGNOSIS — Z992 Dependence on renal dialysis: Secondary | ICD-10-CM | POA: Diagnosis not present

## 2018-11-30 DIAGNOSIS — N2581 Secondary hyperparathyroidism of renal origin: Secondary | ICD-10-CM | POA: Diagnosis not present

## 2018-12-02 DIAGNOSIS — N186 End stage renal disease: Secondary | ICD-10-CM | POA: Diagnosis not present

## 2018-12-02 DIAGNOSIS — Z992 Dependence on renal dialysis: Secondary | ICD-10-CM | POA: Diagnosis not present

## 2018-12-02 DIAGNOSIS — E1129 Type 2 diabetes mellitus with other diabetic kidney complication: Secondary | ICD-10-CM | POA: Diagnosis not present

## 2018-12-03 DIAGNOSIS — Z992 Dependence on renal dialysis: Secondary | ICD-10-CM | POA: Diagnosis not present

## 2018-12-03 DIAGNOSIS — N2581 Secondary hyperparathyroidism of renal origin: Secondary | ICD-10-CM | POA: Diagnosis not present

## 2018-12-03 DIAGNOSIS — N186 End stage renal disease: Secondary | ICD-10-CM | POA: Diagnosis not present

## 2018-12-05 DIAGNOSIS — N186 End stage renal disease: Secondary | ICD-10-CM | POA: Diagnosis not present

## 2018-12-05 DIAGNOSIS — Z992 Dependence on renal dialysis: Secondary | ICD-10-CM | POA: Diagnosis not present

## 2018-12-05 DIAGNOSIS — N2581 Secondary hyperparathyroidism of renal origin: Secondary | ICD-10-CM | POA: Diagnosis not present

## 2018-12-10 DIAGNOSIS — N2581 Secondary hyperparathyroidism of renal origin: Secondary | ICD-10-CM | POA: Diagnosis not present

## 2018-12-10 DIAGNOSIS — Z992 Dependence on renal dialysis: Secondary | ICD-10-CM | POA: Diagnosis not present

## 2018-12-10 DIAGNOSIS — N186 End stage renal disease: Secondary | ICD-10-CM | POA: Diagnosis not present

## 2018-12-12 DIAGNOSIS — N186 End stage renal disease: Secondary | ICD-10-CM | POA: Diagnosis not present

## 2018-12-12 DIAGNOSIS — Z992 Dependence on renal dialysis: Secondary | ICD-10-CM | POA: Diagnosis not present

## 2018-12-12 DIAGNOSIS — N2581 Secondary hyperparathyroidism of renal origin: Secondary | ICD-10-CM | POA: Diagnosis not present

## 2018-12-14 DIAGNOSIS — N186 End stage renal disease: Secondary | ICD-10-CM | POA: Diagnosis not present

## 2018-12-14 DIAGNOSIS — Z992 Dependence on renal dialysis: Secondary | ICD-10-CM | POA: Diagnosis not present

## 2018-12-14 DIAGNOSIS — N2581 Secondary hyperparathyroidism of renal origin: Secondary | ICD-10-CM | POA: Diagnosis not present

## 2018-12-17 DIAGNOSIS — Z992 Dependence on renal dialysis: Secondary | ICD-10-CM | POA: Diagnosis not present

## 2018-12-17 DIAGNOSIS — N2581 Secondary hyperparathyroidism of renal origin: Secondary | ICD-10-CM | POA: Diagnosis not present

## 2018-12-17 DIAGNOSIS — N186 End stage renal disease: Secondary | ICD-10-CM | POA: Diagnosis not present

## 2018-12-19 DIAGNOSIS — N186 End stage renal disease: Secondary | ICD-10-CM | POA: Diagnosis not present

## 2018-12-19 DIAGNOSIS — Z992 Dependence on renal dialysis: Secondary | ICD-10-CM | POA: Diagnosis not present

## 2018-12-19 DIAGNOSIS — N2581 Secondary hyperparathyroidism of renal origin: Secondary | ICD-10-CM | POA: Diagnosis not present

## 2018-12-21 DIAGNOSIS — Z992 Dependence on renal dialysis: Secondary | ICD-10-CM | POA: Diagnosis not present

## 2018-12-21 DIAGNOSIS — N186 End stage renal disease: Secondary | ICD-10-CM | POA: Diagnosis not present

## 2018-12-21 DIAGNOSIS — N2581 Secondary hyperparathyroidism of renal origin: Secondary | ICD-10-CM | POA: Diagnosis not present

## 2018-12-24 DIAGNOSIS — Z992 Dependence on renal dialysis: Secondary | ICD-10-CM | POA: Diagnosis not present

## 2018-12-24 DIAGNOSIS — N2581 Secondary hyperparathyroidism of renal origin: Secondary | ICD-10-CM | POA: Diagnosis not present

## 2018-12-24 DIAGNOSIS — N186 End stage renal disease: Secondary | ICD-10-CM | POA: Diagnosis not present

## 2018-12-26 DIAGNOSIS — N186 End stage renal disease: Secondary | ICD-10-CM | POA: Diagnosis not present

## 2018-12-26 DIAGNOSIS — Z992 Dependence on renal dialysis: Secondary | ICD-10-CM | POA: Diagnosis not present

## 2018-12-26 DIAGNOSIS — N2581 Secondary hyperparathyroidism of renal origin: Secondary | ICD-10-CM | POA: Diagnosis not present

## 2018-12-29 DIAGNOSIS — N2581 Secondary hyperparathyroidism of renal origin: Secondary | ICD-10-CM | POA: Diagnosis not present

## 2018-12-29 DIAGNOSIS — Z992 Dependence on renal dialysis: Secondary | ICD-10-CM | POA: Diagnosis not present

## 2018-12-29 DIAGNOSIS — N186 End stage renal disease: Secondary | ICD-10-CM | POA: Diagnosis not present

## 2019-01-02 DIAGNOSIS — Z992 Dependence on renal dialysis: Secondary | ICD-10-CM | POA: Diagnosis not present

## 2019-01-02 DIAGNOSIS — E1129 Type 2 diabetes mellitus with other diabetic kidney complication: Secondary | ICD-10-CM | POA: Diagnosis not present

## 2019-01-02 DIAGNOSIS — N186 End stage renal disease: Secondary | ICD-10-CM | POA: Diagnosis not present

## 2019-01-06 ENCOUNTER — Encounter (HOSPITAL_COMMUNITY): Payer: Self-pay | Admitting: Emergency Medicine

## 2019-01-06 ENCOUNTER — Emergency Department (HOSPITAL_COMMUNITY): Payer: Medicare HMO

## 2019-01-06 ENCOUNTER — Inpatient Hospital Stay (HOSPITAL_COMMUNITY)
Admission: EM | Admit: 2019-01-06 | Discharge: 2019-01-13 | DRG: 177 | Disposition: A | Discharge status: Home / Self Care | Payer: Medicare HMO | Attending: Internal Medicine | Admitting: Internal Medicine

## 2019-01-06 ENCOUNTER — Other Ambulatory Visit: Payer: Self-pay

## 2019-01-06 DIAGNOSIS — J9601 Acute respiratory failure with hypoxia: Secondary | ICD-10-CM

## 2019-01-06 DIAGNOSIS — J1282 Pneumonia due to coronavirus disease 2019: Secondary | ICD-10-CM | POA: Diagnosis present

## 2019-01-06 DIAGNOSIS — Z823 Family history of stroke: Secondary | ICD-10-CM

## 2019-01-06 DIAGNOSIS — Z992 Dependence on renal dialysis: Secondary | ICD-10-CM | POA: Diagnosis not present

## 2019-01-06 DIAGNOSIS — R066 Hiccough: Secondary | ICD-10-CM | POA: Diagnosis present

## 2019-01-06 DIAGNOSIS — Y95 Nosocomial condition: Secondary | ICD-10-CM | POA: Diagnosis present

## 2019-01-06 DIAGNOSIS — E872 Acidosis: Secondary | ICD-10-CM | POA: Diagnosis present

## 2019-01-06 DIAGNOSIS — U071 COVID-19: Secondary | ICD-10-CM | POA: Diagnosis not present

## 2019-01-06 DIAGNOSIS — Z79899 Other long term (current) drug therapy: Secondary | ICD-10-CM | POA: Diagnosis not present

## 2019-01-06 DIAGNOSIS — I2510 Atherosclerotic heart disease of native coronary artery without angina pectoris: Secondary | ICD-10-CM | POA: Diagnosis not present

## 2019-01-06 DIAGNOSIS — D631 Anemia in chronic kidney disease: Secondary | ICD-10-CM | POA: Diagnosis present

## 2019-01-06 DIAGNOSIS — I2699 Other pulmonary embolism without acute cor pulmonale: Secondary | ICD-10-CM | POA: Diagnosis not present

## 2019-01-06 DIAGNOSIS — Z79891 Long term (current) use of opiate analgesic: Secondary | ICD-10-CM

## 2019-01-06 DIAGNOSIS — Z833 Family history of diabetes mellitus: Secondary | ICD-10-CM

## 2019-01-06 DIAGNOSIS — R0902 Hypoxemia: Secondary | ICD-10-CM | POA: Diagnosis not present

## 2019-01-06 DIAGNOSIS — Z794 Long term (current) use of insulin: Secondary | ICD-10-CM

## 2019-01-06 DIAGNOSIS — N400 Enlarged prostate without lower urinary tract symptoms: Secondary | ICD-10-CM | POA: Diagnosis present

## 2019-01-06 DIAGNOSIS — N2581 Secondary hyperparathyroidism of renal origin: Secondary | ICD-10-CM | POA: Diagnosis not present

## 2019-01-06 DIAGNOSIS — E1165 Type 2 diabetes mellitus with hyperglycemia: Secondary | ICD-10-CM | POA: Diagnosis not present

## 2019-01-06 DIAGNOSIS — R5383 Other fatigue: Secondary | ICD-10-CM | POA: Diagnosis not present

## 2019-01-06 DIAGNOSIS — E871 Hypo-osmolality and hyponatremia: Secondary | ICD-10-CM | POA: Diagnosis not present

## 2019-01-06 DIAGNOSIS — N250 Renal osteodystrophy: Secondary | ICD-10-CM | POA: Diagnosis not present

## 2019-01-06 DIAGNOSIS — N186 End stage renal disease: Secondary | ICD-10-CM | POA: Diagnosis present

## 2019-01-06 DIAGNOSIS — Z209 Contact with and (suspected) exposure to unspecified communicable disease: Secondary | ICD-10-CM | POA: Diagnosis not present

## 2019-01-06 DIAGNOSIS — I132 Hypertensive heart and chronic kidney disease with heart failure and with stage 5 chronic kidney disease, or end stage renal disease: Secondary | ICD-10-CM | POA: Diagnosis not present

## 2019-01-06 DIAGNOSIS — E1122 Type 2 diabetes mellitus with diabetic chronic kidney disease: Secondary | ICD-10-CM | POA: Diagnosis present

## 2019-01-06 DIAGNOSIS — E785 Hyperlipidemia, unspecified: Secondary | ICD-10-CM | POA: Diagnosis present

## 2019-01-06 DIAGNOSIS — I5023 Acute on chronic systolic (congestive) heart failure: Secondary | ICD-10-CM | POA: Diagnosis not present

## 2019-01-06 DIAGNOSIS — I440 Atrioventricular block, first degree: Secondary | ICD-10-CM | POA: Diagnosis not present

## 2019-01-06 DIAGNOSIS — R0600 Dyspnea, unspecified: Secondary | ICD-10-CM | POA: Diagnosis not present

## 2019-01-06 DIAGNOSIS — Z7982 Long term (current) use of aspirin: Secondary | ICD-10-CM | POA: Diagnosis not present

## 2019-01-06 DIAGNOSIS — Z9115 Patient's noncompliance with renal dialysis: Secondary | ICD-10-CM

## 2019-01-06 DIAGNOSIS — I499 Cardiac arrhythmia, unspecified: Secondary | ICD-10-CM | POA: Diagnosis not present

## 2019-01-06 DIAGNOSIS — I120 Hypertensive chronic kidney disease with stage 5 chronic kidney disease or end stage renal disease: Secondary | ICD-10-CM | POA: Diagnosis not present

## 2019-01-06 DIAGNOSIS — E8770 Fluid overload, unspecified: Secondary | ICD-10-CM | POA: Diagnosis not present

## 2019-01-06 DIAGNOSIS — R0602 Shortness of breath: Secondary | ICD-10-CM | POA: Diagnosis not present

## 2019-01-06 LAB — CBC
HCT: 32.5 % — ABNORMAL LOW (ref 39.0–52.0)
Hemoglobin: 10.4 g/dL — ABNORMAL LOW (ref 13.0–17.0)
MCH: 29.6 pg (ref 26.0–34.0)
MCHC: 32 g/dL (ref 30.0–36.0)
MCV: 92.6 fL (ref 80.0–100.0)
Platelets: 204 10*3/uL (ref 150–400)
RBC: 3.51 MIL/uL — ABNORMAL LOW (ref 4.22–5.81)
RDW: 15.7 % — ABNORMAL HIGH (ref 11.5–15.5)
WBC: 6.4 10*3/uL (ref 4.0–10.5)
nRBC: 0 % (ref 0.0–0.2)

## 2019-01-06 LAB — TROPONIN I (HIGH SENSITIVITY)
Troponin I (High Sensitivity): 182 ng/L (ref <18)
Troponin I (High Sensitivity): 191 ng/L (ref <18)

## 2019-01-06 LAB — BASIC METABOLIC PANEL
Anion gap: 18 — ABNORMAL HIGH (ref 5–15)
BUN: 108 mg/dL — ABNORMAL HIGH (ref 8–23)
CO2: 18 mmol/L — ABNORMAL LOW (ref 22–32)
Calcium: 7.7 mg/dL — ABNORMAL LOW (ref 8.9–10.3)
Chloride: 98 mmol/L (ref 98–111)
Creatinine, Ser: 11.31 mg/dL — ABNORMAL HIGH (ref 0.61–1.24)
GFR calc Af Amer: 5 mL/min — ABNORMAL LOW (ref >60)
GFR calc non Af Amer: 4 mL/min — ABNORMAL LOW (ref >60)
Glucose, Bld: 287 mg/dL — ABNORMAL HIGH (ref 70–99)
Potassium: 4.4 mmol/L (ref 3.5–5.1)
Sodium: 134 mmol/L — ABNORMAL LOW (ref 135–145)

## 2019-01-06 LAB — URINALYSIS, ROUTINE W REFLEX MICROSCOPIC
Bilirubin Urine: NEGATIVE
Glucose, UA: 150 mg/dL — AB
Ketones, ur: NEGATIVE mg/dL
Leukocytes,Ua: NEGATIVE
Nitrite: NEGATIVE
Protein, ur: 100 mg/dL — AB
Specific Gravity, Urine: 1.014 (ref 1.005–1.030)
pH: 5 (ref 5.0–8.0)

## 2019-01-06 LAB — FERRITIN: Ferritin: 5249 ng/mL — ABNORMAL HIGH (ref 24–336)

## 2019-01-06 LAB — CBG MONITORING, ED
Glucose-Capillary: 237 mg/dL — ABNORMAL HIGH (ref 70–99)
Glucose-Capillary: 224 mg/dL — ABNORMAL HIGH (ref 70–99)

## 2019-01-06 LAB — ABO/RH: ABO/RH(D): O POS

## 2019-01-06 LAB — LACTATE DEHYDROGENASE: LDH: 559 U/L — ABNORMAL HIGH (ref 98–192)

## 2019-01-06 LAB — D-DIMER, QUANTITATIVE: D-Dimer, Quant: 3.1 ug/mL-FEU — ABNORMAL HIGH (ref 0.00–0.50)

## 2019-01-06 LAB — C-REACTIVE PROTEIN: CRP: 28.1 mg/dL — ABNORMAL HIGH (ref <1.0)

## 2019-01-06 LAB — POC SARS CORONAVIRUS 2 AG -  ED: SARS Coronavirus 2 Ag: POSITIVE — AB

## 2019-01-06 LAB — FIBRINOGEN: Fibrinogen: 793 mg/dL — ABNORMAL HIGH (ref 210–475)

## 2019-01-06 MED ORDER — HEPARIN SODIUM (PORCINE) 5000 UNIT/ML IJ SOLN
5000.0000 [IU] | Freq: Three times a day (TID) | INTRAMUSCULAR | Status: DC
  Administered 2019-01-06 – 2019-01-09 (×8): 5000 [IU] via SUBCUTANEOUS
  Filled 2019-01-06 (×10): qty 1

## 2019-01-06 MED ORDER — INSULIN ASPART 100 UNIT/ML ~~LOC~~ SOLN
0.0000 [IU] | Freq: Three times a day (TID) | SUBCUTANEOUS | Status: DC
  Administered 2019-01-06: 5 [IU] via SUBCUTANEOUS
  Administered 2019-01-07 (×2): 8 [IU] via SUBCUTANEOUS
  Administered 2019-01-07: 5 [IU] via SUBCUTANEOUS
  Administered 2019-01-08: 8 [IU] via SUBCUTANEOUS
  Administered 2019-01-08: 5 [IU] via SUBCUTANEOUS
  Administered 2019-01-08: 8 [IU] via SUBCUTANEOUS
  Administered 2019-01-10: 5 [IU] via SUBCUTANEOUS
  Administered 2019-01-10 (×2): 3 [IU] via SUBCUTANEOUS
  Administered 2019-01-11: 8 [IU] via SUBCUTANEOUS
  Administered 2019-01-11: 2 [IU] via SUBCUTANEOUS
  Administered 2019-01-12: 11 [IU] via SUBCUTANEOUS
  Administered 2019-01-12 (×2): 15 [IU] via SUBCUTANEOUS
  Administered 2019-01-13: 5 [IU] via SUBCUTANEOUS
  Administered 2019-01-13: 11 [IU] via SUBCUTANEOUS
  Administered 2019-01-13: 15 [IU] via SUBCUTANEOUS

## 2019-01-06 MED ORDER — SODIUM CHLORIDE 0.9 % IV SOLN
200.0000 mg | Freq: Once | INTRAVENOUS | Status: AC
  Administered 2019-01-06: 200 mg via INTRAVENOUS
  Filled 2019-01-06: qty 40

## 2019-01-06 MED ORDER — ALBUTEROL SULFATE HFA 108 (90 BASE) MCG/ACT IN AERS
2.0000 | INHALATION_SPRAY | Freq: Four times a day (QID) | RESPIRATORY_TRACT | Status: DC
  Administered 2019-01-06 – 2019-01-07 (×3): 2 via RESPIRATORY_TRACT
  Filled 2019-01-06: qty 6.7

## 2019-01-06 MED ORDER — DOCUSATE SODIUM 100 MG PO CAPS
100.0000 mg | ORAL_CAPSULE | Freq: Two times a day (BID) | ORAL | Status: DC
  Administered 2019-01-06 – 2019-01-13 (×10): 100 mg via ORAL
  Filled 2019-01-06 (×12): qty 1

## 2019-01-06 MED ORDER — ISOSORBIDE MONONITRATE ER 30 MG PO TB24
30.0000 mg | ORAL_TABLET | Freq: Every day | ORAL | Status: DC
  Filled 2019-01-06 (×2): qty 1

## 2019-01-06 MED ORDER — DARBEPOETIN ALFA 60 MCG/0.3ML IJ SOSY: 60.0000 ug | PREFILLED_SYRINGE | INTRAMUSCULAR | Status: DC

## 2019-01-06 MED ORDER — INSULIN ASPART 100 UNIT/ML ~~LOC~~ SOLN
0.0000 [IU] | Freq: Every day | SUBCUTANEOUS | Status: DC
  Administered 2019-01-07: 2 [IU] via SUBCUTANEOUS
  Administered 2019-01-07 – 2019-01-08 (×2): 4 [IU] via SUBCUTANEOUS
  Administered 2019-01-09: 2 [IU] via SUBCUTANEOUS
  Administered 2019-01-10: 3 [IU] via SUBCUTANEOUS
  Administered 2019-01-12: 4 [IU] via SUBCUTANEOUS

## 2019-01-06 MED ORDER — SODIUM CHLORIDE 0.9 % IV SOLN
100.0000 mg | Freq: Every day | INTRAVENOUS | Status: AC
  Administered 2019-01-07 – 2019-01-10 (×4): 100 mg via INTRAVENOUS
  Filled 2019-01-06 (×5): qty 20

## 2019-01-06 MED ORDER — SEVELAMER CARBONATE 800 MG PO TABS
2400.0000 mg | ORAL_TABLET | Freq: Three times a day (TID) | ORAL | Status: DC
  Administered 2019-01-08 (×3): 2400 mg via ORAL
  Filled 2019-01-06 (×7): qty 3

## 2019-01-06 MED ORDER — HYDRALAZINE HCL 25 MG PO TABS
37.5000 mg | ORAL_TABLET | Freq: Three times a day (TID) | ORAL | Status: DC
  Administered 2019-01-06: 37.5 mg via ORAL
  Filled 2019-01-06 (×3): qty 2

## 2019-01-06 MED ORDER — METOPROLOL SUCCINATE ER 50 MG PO TB24
50.0000 mg | ORAL_TABLET | Freq: Every day | ORAL | Status: DC
  Filled 2019-01-06: qty 1

## 2019-01-06 MED ORDER — SODIUM CHLORIDE 0.9 % IV SOLN: 62.5000 mg | INTRAVENOUS | Status: DC

## 2019-01-06 MED ORDER — FUROSEMIDE 10 MG/ML IJ SOLN
40.0000 mg | Freq: Once | INTRAMUSCULAR | Status: AC
  Administered 2019-01-06: 40 mg via INTRAVENOUS
  Filled 2019-01-06: qty 4

## 2019-01-06 MED ORDER — ASPIRIN EC 81 MG PO TBEC
81.0000 mg | DELAYED_RELEASE_TABLET | Freq: Every day | ORAL | Status: DC
  Administered 2019-01-08 – 2019-01-13 (×5): 81 mg via ORAL
  Filled 2019-01-06 (×8): qty 1

## 2019-01-06 MED ORDER — HEPARIN SODIUM (PORCINE) 5000 UNIT/ML IJ SOLN: 5000.0000 [IU] | Freq: Three times a day (TID) | INTRAMUSCULAR | Status: DC

## 2019-01-06 MED ORDER — CALCITRIOL 0.25 MCG PO CAPS
0.5000 ug | ORAL_CAPSULE | ORAL | Status: DC
  Administered 2019-01-07 – 2019-01-11 (×2): 0.5 ug via ORAL
  Filled 2019-01-06: qty 1
  Filled 2019-01-06 (×2): qty 2

## 2019-01-06 MED ORDER — ACETAMINOPHEN 325 MG PO TABS: 650.0000 mg | ORAL_TABLET | Freq: Four times a day (QID) | ORAL | Status: DC | PRN

## 2019-01-06 MED ORDER — OXYCODONE HCL 5 MG PO TABS
5.0000 mg | ORAL_TABLET | ORAL | Status: DC | PRN
  Administered 2019-01-06 – 2019-01-11 (×3): 5 mg via ORAL
  Filled 2019-01-06 (×3): qty 1

## 2019-01-06 MED ORDER — DEXAMETHASONE 4 MG PO TABS
6.0000 mg | ORAL_TABLET | ORAL | Status: DC
  Administered 2019-01-06 – 2019-01-07 (×2): 6 mg via ORAL
  Filled 2019-01-06 (×2): qty 2

## 2019-01-06 MED ORDER — ROSUVASTATIN CALCIUM 20 MG PO TABS
40.0000 mg | ORAL_TABLET | Freq: Every day | ORAL | Status: DC
  Administered 2019-01-08 – 2019-01-13 (×5): 40 mg via ORAL
  Filled 2019-01-06 (×7): qty 2

## 2019-01-06 NOTE — ED Notes (Addendum)
This RN attempted twice to draw lab specimens unsuccessfully. Tommi Rumps, EMT is attempting.   Also requested again that medications be verified to administer them.

## 2019-01-06 NOTE — Progress Notes (Signed)
Renal Navigator notes patient's positive COVID test and notified [facility] to transfer [facility] to [facility] for treatment at discharge. His new schedule will be TTS 12:00pm. Renal Navigator will continue to follow.  Alphonzo Cruise, [place] Renal Navigator [phone]

## 2019-01-06 NOTE — ED Notes (Signed)
Got patient on the monitor did ekg shown to Dr Goldston patient is resting with call bell in reach 

## 2019-01-06 NOTE — ED Triage Notes (Addendum)
Pt has missed  3 diaylsis appointments because  He felt like he was getting sick now pt has had increasing weakness has had cough and diarrhea spo2 88 % ra on [place] o2 at 6 l per [place] no fever sugar 335 bp 156/64  Temp 98.6  Has a port and working on a graft

## 2019-01-06 NOTE — ED Provider Notes (Signed)
[facility] EMERGENCY DEPARTMENT Provider Note   CSN: [id number] Arrival date & time: 01/06/19  1300     History Chief Complaint  Patient presents with  . Shortness of Breath    Justin [name] is a 69 y.o. male.  HPI  69 year old male presents with shortness of breath.  He has not been to dialysis since December 27.  Missed dialysis after this because he started feeling fatigued and was wondering if he was coming down with an infection.  He does not think he has had a fever.  Over the last day or so he developed a cough.  Today he became short of breath and so he called EMS.  They found his O2 sat to be 80%.  He denies chest pain. Normally goes Tuesday/thursday/saturday.  Past Medical History:  Diagnosis Date  . BPH (benign prostatic hyperplasia)   . Chronic kidney disease (CKD), stage IV (severe) ([place])   . Diabetes ([place])   . Dysuria   . Erectile dysfunction   . Hyperlipemia   . Hypertension   . Pinna disorder, left   . Renal disorder   . Wears glasses     Patient Active Problem List   Diagnosis Date Noted  . COVID-19 virus infection 01/06/2019  . COVID-19 01/06/2019  . Hypertensive emergency   . Acute respiratory failure with hypoxia ([place])   . CKD (chronic kidney disease)   . Nonrheumatic aortic valve stenosis   . ESRD (end stage renal disease) ([place])   . Coronary artery disease involving native coronary artery of native heart without angina pectoris   . Acute systolic CHF (congestive heart failure) ([place])   . Acute congestive heart failure ([place]) 04/24/2018  . Acute respiratory distress 04/24/2018  . Hypertensive urgency 04/24/2018  . Acute renal failure superimposed on chronic kidney disease ([place]) 04/24/2018  . Insulin dependent diabetes mellitus 04/24/2018  . Hyperlipidemia 04/24/2018    Past Surgical History:  Procedure Laterality Date  . AV FISTULA PLACEMENT Left 04/30/2018   Procedure: CREATION OF RADIOCEPHALIC ARTERIOVENOUS FISTULA LEFT ARM;   Surgeon: Elam Dutch, MD;  Location: [facility];  Service: Vascular;  Laterality: Left;  . AV FISTULA PLACEMENT Left 08/28/2018   Procedure: ARTERIOVENOUS (AV) BRACHIOCEPHALIC FISTULA CREATION LEFT UPPER ARM;  Surgeon: Marty Heck, MD;  Location: [facility];  Service: Vascular;  Laterality: Left;  . IR FLUORO GUIDE CV LINE RIGHT  04/29/2018  . IR US GUIDE VASC ACCESS RIGHT  04/29/2018  . RIGHT/LEFT HEART CATH AND CORONARY ANGIOGRAPHY N/A 05/01/2018   Procedure: RIGHT/LEFT HEART CATH AND CORONARY ANGIOGRAPHY;  Surgeon: Belva Crome, MD;  Location: [facility];  Service: Cardiovascular;  Laterality: N/A;       Family History  Problem Relation Age of Onset  . Stroke Mother   . Diabetes Father     Social History   Tobacco Use  . Smoking status: Never Smoker  . Smokeless tobacco: Never Used  Substance Use Topics  . Alcohol use: Not Currently  . Drug use: Never    Home Medications Prior to Admission medications   Medication Sig Start Date End Date Taking? Authorizing Provider  acetaminophen (TYLENOL) 325 MG tablet Take 650 mg by mouth every 6 (six) hours as needed (pain).    [provider]  aspirin EC 81 MG EC tablet Take 1 tablet (81 mg total) by mouth daily. 05/03/18   Florencia Reasons, MD  hydrALAZINE (APRESOLINE) 25 MG tablet Take 1.5 tablets (37.5 mg total)  by mouth 3 (three) times daily. Patient taking differently: Take 37.5 mg by mouth 3 (three) times daily with meals.  05/02/18   Florencia Reasons, MD  isosorbide mononitrate (IMDUR) 30 MG 24 hr tablet Take 1 tablet (30 mg total) by mouth daily. 05/02/18   Florencia Reasons, MD  metoprolol succinate (TOPROL-XL) 50 MG 24 hr tablet Take 1 tablet (50 mg total) by mouth daily. Take with or immediately following a meal. 05/03/18   Florencia Reasons, MD  NOVOLIN 70/30 RELION (70-30) 100 UNIT/ML injection Inject 20 Units into the skin daily. 05/02/18   Florencia Reasons, MD  oxyCODONE (ROXICODONE) 5 MG immediate release tablet Take 1 tablet (5 mg total) by mouth  every 6 (six) hours as needed. 08/28/18   Rhyne, Hulen Shouts, PA-C  rosuvastatin (CRESTOR) 40 MG tablet Take 1 tablet (40 mg total) by mouth daily. 08/29/18   Lelon Perla, MD  sevelamer carbonate (RENVELA) 800 MG tablet Take 2,400 mg by mouth 3 (three) times daily with meals.  08/13/18   [provider]    Allergies    Patient has no known allergies.  Review of Systems   Review of Systems  Constitutional: Positive for fatigue. Negative for fever.  Respiratory: Positive for cough and shortness of breath.   Cardiovascular: Negative for chest pain and leg swelling.  All other systems reviewed and are negative.   Physical Exam Updated Vital Signs BP (!) 158/64 (BP Location: Left Arm)   Pulse 69   Temp 99.6 F (37.6 C) (Oral)   Resp (!) 42   SpO2 94%   Physical Exam Vitals and nursing note reviewed.  Constitutional:      General: He is not in acute distress.    Appearance: He is well-developed. He is not diaphoretic.  HENT:     Head: Normocephalic and atraumatic.     Right Ear: External ear normal.     Left Ear: External ear normal.     Nose: Nose normal.  Eyes:     General:        Right eye: No discharge.        Left eye: No discharge.  Cardiovascular:     Rate and Rhythm: Normal rate and regular rhythm.     Heart sounds: Normal heart sounds.  Pulmonary:     Effort: Pulmonary effort is normal. Tachypnea present. No accessory muscle usage.     Breath sounds: Rales present.  Abdominal:     Palpations: Abdomen is soft.     Tenderness: There is no abdominal tenderness.  Musculoskeletal:     Cervical back: Neck supple.  Skin:    General: Skin is warm and dry.  Neurological:     Mental Status: He is alert.  Psychiatric:        Mood and Affect: Mood is not anxious.     ED Results / Procedures / Treatments   Labs (all labs ordered are listed, but only abnormal results are displayed) Labs Reviewed  BASIC METABOLIC PANEL - Abnormal; Notable for the  following components:      Result Value   Sodium 134 (*)    CO2 18 (*)    Glucose, Bld 287 (*)    BUN 108 (*)    Creatinine, Ser 11.31 (*)    Calcium 7.7 (*)    GFR calc non Af Amer 4 (*)    GFR calc Af Amer 5 (*)    Anion gap 18 (*)    All other components within  normal limits  CBC - Abnormal; Notable for the following components:   RBC 3.51 (*)    Hemoglobin 10.4 (*)    HCT 32.5 (*)    RDW 15.7 (*)    All other components within normal limits  URINALYSIS, ROUTINE W REFLEX MICROSCOPIC - Abnormal; Notable for the following components:   Glucose, UA 150 (*)    Hgb urine dipstick SMALL (*)    Protein, ur 100 (*)    Bacteria, UA RARE (*)    All other components within normal limits  POC SARS CORONAVIRUS 2 AG -  ED - Abnormal; Notable for the following components:   SARS Coronavirus 2 Ag POSITIVE (*)    All other components within normal limits  TROPONIN I (HIGH SENSITIVITY) - Abnormal; Notable for the following components:   Troponin I (High Sensitivity) 182 (*)    All other components within normal limits  CULTURE, BLOOD (ROUTINE X 2)  CULTURE, BLOOD (ROUTINE X 2)  URINE CULTURE  HEMOGLOBIN A1C  C-REACTIVE PROTEIN  D-DIMER, QUANTITATIVE (NOT AT [facility])  FERRITIN  FIBRINOGEN  LACTATE DEHYDROGENASE  ABO/RH  TROPONIN I (HIGH SENSITIVITY)    EKG None  Radiology DG Chest Portable 1 View  Result Date: 01/06/2019 CLINICAL DATA:  Short of breath. EXAM: PORTABLE CHEST 1 VIEW COMPARISON:  04/24/2018 FINDINGS: A new right jugular catheter terminates over the superior cavoatrial junction/high right atrium. The cardiac silhouette is moderately enlarged. There is mild central pulmonary vascular congestion without overt edema. No confluent airspace opacity, sizeable pleural effusion, or pneumothorax is identified. No acute osseous abnormality is seen. IMPRESSION: Cardiomegaly with mild pulmonary vascular congestion. Electronically Signed   By: Logan Bores M.D.   On: 01/06/2019 14:29     Procedures Procedures (including critical care time)  Medications Ordered in ED Medications  aspirin EC tablet 81 mg (has no administration in time range)  hydrALAZINE (APRESOLINE) tablet 37.5 mg (has no administration in time range)  isosorbide mononitrate (IMDUR) 24 hr tablet 30 mg (has no administration in time range)  metoprolol succinate (TOPROL-XL) 24 hr tablet 50 mg (has no administration in time range)  rosuvastatin (CRESTOR) tablet 40 mg (has no administration in time range)  sevelamer carbonate (RENVELA) tablet 2,400 mg (has no administration in time range)  insulin aspart (novoLOG) injection 0-15 Units (has no administration in time range)  insulin aspart (novoLOG) injection 0-5 Units (has no administration in time range)  remdesivir 200 mg in sodium chloride 0.9% 250 mL IVPB (has no administration in time range)    Followed by  remdesivir 100 mg in sodium chloride 0.9 % 100 mL IVPB (has no administration in time range)  albuterol (VENTOLIN HFA) 108 (90 Base) MCG/ACT inhaler 2 puff (has no administration in time range)  dexamethasone (DECADRON) tablet 6 mg (has no administration in time range)  acetaminophen (TYLENOL) tablet 650 mg (has no administration in time range)  oxyCODONE (Oxy IR/ROXICODONE) immediate release tablet 5 mg (has no administration in time range)  docusate sodium (COLACE) capsule 100 mg (has no administration in time range)  heparin injection 5,000 Units (has no administration in time range)    ED Course  I have reviewed the triage vital signs and the nursing notes.  Pertinent labs & imaging results that were available during my care of the patient were reviewed by me and considered in my medical decision making (see chart for details).    MDM Rules/Calculators/A&P  Patient's hypoxia is likely a mix of Covid as well as missing dialysis for 1 week.  He is not in distress.  He will need admission for respiratory support.  I have  paged nephrology though no response and paged again.  This consult is currently pending but he will need admission to the hospitalist service.  Justin [name] was evaluated in Emergency Department on 01/06/2019 for the symptoms described in the history of present illness. He was evaluated in the context of the global COVID-19 pandemic, which necessitated consideration that the patient might be at risk for infection with the SARS-CoV-2 virus that causes COVID-19. Institutional protocols and algorithms that pertain to the evaluation of patients at risk for COVID-19 are in a state of rapid change based on information released by regulatory bodies including the CDC and federal and state organizations. These policies and algorithms were followed during the patient's care in the ED.  Final Clinical Impression(s) / ED Diagnoses Final diagnoses:  Acute respiratory failure with hypoxia ([place])  COVID-19 virus infection    Rx / DC Orders ED Discharge Orders    None       Sherwood Gambler, MD 01/06/19 [phone]

## 2019-01-06 NOTE — H&P (Signed)
History and Physical    Justin [name] [id number] DOB: [birth year] DOA: 01/06/2019  PCP: Reynold Bowen, MD   Patient coming from: Home  I have personally briefly reviewed patient's old medical records in [place]  Chief Complaint: Cough short of breath and increasing generalized weakness  HPI: Justin [name] is a 69 y.o. male with medical history significant of ESRD on HD, TTS, IDDM, HTN, HLD, presented with increasing short of breath for 1 week.  Along with generalized weakness, also developed productive cough with whitish sputum about 3 days ago.  He missed 3 sessions of his dialysis because of feeling weakness and not well.  He had a brief episode of diarrhea 3 days ago but resolved, denied any fever or chills, no chest pain, no loss of taste.  She woke up this morning with clear short of breath and called 911.  .ED Course: O2 saturation 80% on room air on arrival in the ED, then stabilized on 4 L chest x-ray showed multifocal infiltrates suspecting COVID pneumonia as pulmonary vascular congestion suggesting fluid overload, COVID test done came back positive.   Review of Systems: As per HPI otherwise 10 point review of systems negative.    Past Medical History:  Diagnosis Date  . BPH (benign prostatic hyperplasia)   . Chronic kidney disease (CKD), stage IV (severe) ([place])   . Diabetes ([place])   . Dysuria   . Erectile dysfunction   . Hyperlipemia   . Hypertension   . Pinna disorder, left   . Renal disorder   . Wears glasses     Past Surgical History:  Procedure Laterality Date  . AV FISTULA PLACEMENT Left 04/30/2018   Procedure: CREATION OF RADIOCEPHALIC ARTERIOVENOUS FISTULA LEFT ARM;  Surgeon: Elam Dutch, MD;  Location: [facility];  Service: Vascular;  Laterality: Left;  . AV FISTULA PLACEMENT Left 08/28/2018   Procedure: ARTERIOVENOUS (AV) BRACHIOCEPHALIC FISTULA CREATION LEFT UPPER ARM;  Surgeon: Marty Heck, MD;  Location: [facility];  Service: Vascular;   Laterality: Left;  . IR FLUORO GUIDE CV LINE RIGHT  04/29/2018  . IR US GUIDE VASC ACCESS RIGHT  04/29/2018  . RIGHT/LEFT HEART CATH AND CORONARY ANGIOGRAPHY N/A 05/01/2018   Procedure: RIGHT/LEFT HEART CATH AND CORONARY ANGIOGRAPHY;  Surgeon: Belva Crome, MD;  Location: [place] [facility];  Service: Cardiovascular;  Laterality: N/A;     reports that he has never smoked. He has never used smokeless tobacco. He reports previous alcohol use. He reports that he does not use drugs.  No Known Allergies  Family History  Problem Relation Age of Onset  . Stroke Mother   . Diabetes Father      Prior to Admission medications   Medication Sig Start Date End Date Taking? Authorizing Provider  acetaminophen (TYLENOL) 325 MG tablet Take 325 mg by mouth every 6 (six) hours as needed (pain).    Yes [provider]  aspirin EC 81 MG EC tablet Take 1 tablet (81 mg total) by mouth daily. Patient taking differently: Take 81 mg by mouth See admin instructions. Take one tablet (81 mg) by mouth with breakfast on Sunday, Monday, Wednesday, Friday; take one tablet (81 mg) with lunch on Tuesday, Thursday, Saturday (dialysis days) 05/03/18  Yes Florencia Reasons, MD  hydrALAZINE (APRESOLINE) 25 MG tablet Take 1.5 tablets (37.5 mg total) by mouth 3 (three) times daily. Patient taking differently: Take 37.5 mg by mouth 3 (three) times daily with meals.  05/02/18  Yes Florencia Reasons,  MD  isosorbide mononitrate (IMDUR) 30 MG 24 hr tablet Take 1 tablet (30 mg total) by mouth daily. Patient taking differently: Take 30 mg by mouth See admin instructions. Take one tablet (30 mg) by mouth with breakfast on Sunday, Monday, Wednesday, Friday; take one tablet (30 mg) with lunch on Tuesday, Thursday, Saturday (dialysis days) 05/02/18  Yes Florencia Reasons, MD  metoprolol succinate (TOPROL-XL) 50 MG 24 hr tablet Take 1 tablet (50 mg total) by mouth daily. Take with or immediately following a meal. Patient taking differently: Take 50 mg by mouth  See admin instructions. Take one tablet (50 mg) by mouth with breakfast on Sunday, Monday, Wednesday, Friday; take one tablet (50 mg) with lunch on Tuesday, Thursday, Saturday (dialysis days) 05/03/18  Yes Florencia Reasons, MD  NOVOLIN 70/30 RELION (70-30) 100 UNIT/ML injection Inject 20 Units into the skin daily. Patient taking differently: Inject 20 Units into the skin See admin instructions. Inject 20 units subcutaneously with breakfast on Sunday, Monday, Wednesday, Friday; inject 20 units with lunch on Tuesday, Thursday, Saturday (dialysis days) 05/02/18  Yes Florencia Reasons, MD  oxyCODONE (ROXICODONE) 5 MG immediate release tablet Take 1 tablet (5 mg total) by mouth every 6 (six) hours as needed. Patient taking differently: Take 5 mg by mouth every 6 (six) hours as needed (pain).  08/28/18  Yes Rhyne, Samantha J, PA-C  rosuvastatin (CRESTOR) 40 MG tablet Take 1 tablet (40 mg total) by mouth daily. Patient taking differently: Take 40 mg by mouth See admin instructions. Take one tablet (40 mg) by mouth with breakfast on Sunday, Monday, Wednesday, Friday; take one tablet (40 mg) with lunch on Tuesday, Thursday, Saturday (dialysis days) 08/29/18  Yes Lelon Perla, MD  sevelamer carbonate (RENVELA) 800 MG tablet Take 2,400 mg by mouth 3 (three) times daily with meals.  08/13/18  Yes [provider]    Physical Exam: Vitals:   01/06/19 1545 01/06/19 1604 01/06/19 1630 01/06/19 1645  BP: (!) 158/64 (!) 158/64 (!) 160/63 (!) 148/58  Pulse: 67 69 (!) 135 67  Resp: (!) 28 (!) 42 (!) 41 (!) 31  Temp:      TempSrc:      SpO2: (!) 89% 94% (!) 87% (!) 89%    Constitutional: NAD, calm, comfortable Vitals:   01/06/19 1545 01/06/19 1604 01/06/19 1630 01/06/19 1645  BP: (!) 158/64 (!) 158/64 (!) 160/63 (!) 148/58  Pulse: 67 69 (!) 135 67  Resp: (!) 28 (!) 42 (!) 41 (!) 31  Temp:      TempSrc:      SpO2: (!) 89% 94% (!) 87% (!) 89%   Eyes: PERRL, lids and conjunctivae normal ENMT: Mucous membranes are  moist. Posterior pharynx clear of any exudate or lesions.Normal dentition.  Neck: normal, supple, no masses, no thyromegaly Respiratory: Bilateral crackles to the mid level, no wheezing, no crackles. Normal respiratory effort. No accessory muscle use.  Cardiovascular: Regular rate and rhythm, tachycardia, no murmurs / rubs / gallops. No extremity edema. 2+ pedal pulses. No carotid bruits.  Abdomen: no tenderness, no masses palpated. No hepatosplenomegaly. Bowel sounds positive.  Musculoskeletal: no clubbing / cyanosis. No joint deformity upper and lower extremities. Good ROM, no contractures. Normal muscle tone.  Skin: no rashes, lesions, ulcers. No induration Neurologic: CN 2-12 grossly intact. Sensation intact, DTR normal. Strength 5/5 in all 4.  Psychiatric: Normal judgment and insight. Alert and oriented x 3. Normal mood.     Labs on Admission: I have personally reviewed following labs and  imaging studies  CBC: Recent Labs  Lab 01/06/19 1349  WBC 6.4  HGB 10.4*  HCT 32.5*  MCV 92.6  PLT 0000000   Basic Metabolic Panel: Recent Labs  Lab 01/06/19 1349  NA 134*  K 4.4  CL 98  CO2 18*  GLUCOSE 287*  BUN 108*  CREATININE 11.31*  CALCIUM 7.7*   GFR: CrCl cannot be calculated (Unknown ideal weight.). Liver Function Tests: No results for input(s): AST, ALT, ALKPHOS, BILITOT, PROT, ALBUMIN in the last 168 hours. No results for input(s): LIPASE, AMYLASE in the last 168 hours. No results for input(s): AMMONIA in the last 168 hours. Coagulation Profile: No results for input(s): INR, PROTIME in the last 168 hours. Cardiac Enzymes: No results for input(s): CKTOTAL, CKMB, CKMBINDEX, TROPONINI in the last 168 hours. BNP (last 3 results) No results for input(s): PROBNP in the last 8760 hours. HbA1C: No results for input(s): HGBA1C in the last 72 hours. CBG: No results for input(s): GLUCAP in the last 168 hours. Lipid Profile: No results for input(s): CHOL, HDL, LDLCALC, TRIG,  CHOLHDL, LDLDIRECT in the last 72 hours. Thyroid Function Tests: No results for input(s): TSH, T4TOTAL, FREET4, T3FREE, THYROIDAB in the last 72 hours. Anemia Panel: No results for input(s): VITAMINB12, FOLATE, FERRITIN, TIBC, IRON, RETICCTPCT in the last 72 hours. Urine analysis:    Component Value Date/Time   COLORURINE YELLOW 01/06/2019 1600   APPEARANCEUR CLEAR 01/06/2019 1600   LABSPEC 1.014 01/06/2019 1600   PHURINE 5.0 01/06/2019 1600   GLUCOSEU 150 (A) 01/06/2019 1600   HGBUR SMALL (A) 01/06/2019 1600   BILIRUBINUR NEGATIVE 01/06/2019 1600   KETONESUR NEGATIVE 01/06/2019 1600   PROTEINUR 100 (A) 01/06/2019 1600   NITRITE NEGATIVE 01/06/2019 1600   LEUKOCYTESUR NEGATIVE 01/06/2019 1600    Radiological Exams on Admission: DG Chest Portable 1 View  Result Date: 01/06/2019 CLINICAL DATA:  Short of breath. EXAM: PORTABLE CHEST 1 VIEW COMPARISON:  04/24/2018 FINDINGS: A new right jugular catheter terminates over the superior cavoatrial junction/high right atrium. The cardiac silhouette is moderately enlarged. There is mild central pulmonary vascular congestion without overt edema. No confluent airspace opacity, sizeable pleural effusion, or pneumothorax is identified. No acute osseous abnormality is seen. IMPRESSION: Cardiomegaly with mild pulmonary vascular congestion. Electronically Signed   By: Logan Bores M.D.   On: 01/06/2019 14:29    EKG: Independently reviewed.   Assessment/Plan Active Problems:   COVID-19 virus infection   COVID-19  Acute hypoxic respite failure, probably from a combination of Covid pneumonia plus fluid overload from noncompliance with hemodialysis.  Currently on 4 L oxygen stabilized at 100% however still very symptomatic with dyspnea, talking in broken sentences.  Discussed with on-call nephrologist, who suggested that [facility] cannot accommodate hemodialysis, will keep patient in [facility] for emergency hemodialysis.  Start antiviral treatment and  steroid.  Expect patient to improve after hemodialysis.  And Covid markers sent.   Non gap metabolic acidosis, from ESRD, emergency dialysis.  ESRD, as above.  Hypertension, 1 dose Lasix given while waiting for HD team.  Diabetes, sliding scale for now.    DVT prophylaxis: Subcu heparin Code Status: Full code Family Communication: None at bedside  disposition Plan: Home Consults called: Nephrologist Admission status: Telemetry admission   Lequita Halt MD [facility] Pager [phone]  If 7PM-7AM, please contact night-coverage [email or web address] Password [facility]  01/06/2019, 5:52 PM

## 2019-01-06 NOTE — Consult Note (Addendum)
[facility] Renal Consultation Note  Indication for Consultation:  Management of ESRD/hemodialysis; anemia, hypertension/volume and secondary hyperparathyroidism  HPI: Justin [name] is a 69 y.o. male with ESRD   started HD 04/22/18 [facility] admit.HO  - DM, HTN, CHF, HLD, AoV stenosis, sig CAD med tx 05/01/18 L heart cath [facility] EF 35-40%. He presented to ER  SOB  O2 sat 80 %  Denying chest pain , last HD was 12/29/18 ,missed last 3 HD txs / Noted COVID  POSITIVE  ER Labs  K 4.4 bun 108, cr 11.31  CXR =Cardiomegaly with mild pulmonary vascular congestion. With out OVERT  EDEMA    Noted O2 sat up to 94 %  With O2 [place] 4l    We are asked to see for HD / ESRD  Management     Past Medical History:  Diagnosis Date  . BPH (benign prostatic hyperplasia)   . Chronic kidney disease (CKD), stage IV (severe) ([place])   . Diabetes ([place])   . Dysuria   . Erectile dysfunction   . Hyperlipemia   . Hypertension   . Pinna disorder, left   . Renal disorder   . Wears glasses     Past Surgical History:  Procedure Laterality Date  . AV FISTULA PLACEMENT Left 04/30/2018   Procedure: CREATION OF RADIOCEPHALIC ARTERIOVENOUS FISTULA LEFT ARM;  Surgeon: Elam Dutch, MD;  Location: [facility];  Service: Vascular;  Laterality: Left;  . AV FISTULA PLACEMENT Left 08/28/2018   Procedure: ARTERIOVENOUS (AV) BRACHIOCEPHALIC FISTULA CREATION LEFT UPPER ARM;  Surgeon: Marty Heck, MD;  Location: [facility];  Service: Vascular;  Laterality: Left;  . IR FLUORO GUIDE CV LINE RIGHT  04/29/2018  . IR US GUIDE VASC ACCESS RIGHT  04/29/2018  . RIGHT/LEFT HEART CATH AND CORONARY ANGIOGRAPHY N/A 05/01/2018   Procedure: RIGHT/LEFT HEART CATH AND CORONARY ANGIOGRAPHY;  Surgeon: Belva Crome, MD;  Location: [place] CV [facility];  Service: Cardiovascular;  Laterality: N/A;      Family History  Problem Relation Age of Onset  . Stroke Mother   . Diabetes Father       reports that he has never smoked. He has  never used smokeless tobacco. He reports previous alcohol use. He reports that he does not use drugs.  No Known Allergies  Prior to Admission medications   Medication Sig Start Date End Date Taking? Authorizing Provider  acetaminophen (TYLENOL) 325 MG tablet Take 650 mg by mouth every 6 (six) hours as needed (pain).    [provider]  aspirin EC 81 MG EC tablet Take 1 tablet (81 mg total) by mouth daily. 05/03/18   Florencia Reasons, MD  hydrALAZINE (APRESOLINE) 25 MG tablet Take 1.5 tablets (37.5 mg total) by mouth 3 (three) times daily. Patient taking differently: Take 37.5 mg by mouth 3 (three) times daily with meals.  05/02/18   Florencia Reasons, MD  isosorbide mononitrate (IMDUR) 30 MG 24 hr tablet Take 1 tablet (30 mg total) by mouth daily. 05/02/18   Florencia Reasons, MD  metoprolol succinate (TOPROL-XL) 50 MG 24 hr tablet Take 1 tablet (50 mg total) by mouth daily. Take with or immediately following a meal. 05/03/18   Florencia Reasons, MD  NOVOLIN 70/30 RELION (70-30) 100 UNIT/ML injection Inject 20 Units into the skin daily. 05/02/18   Florencia Reasons, MD  oxyCODONE (ROXICODONE) 5 MG immediate release tablet Take 1 tablet (5 mg total) by mouth every 6 (six) hours as needed. 08/28/18  Rhyne, Samantha J, PA-C  rosuvastatin (CRESTOR) 40 MG tablet Take 1 tablet (40 mg total) by mouth daily. 08/29/18   Lelon Perla, MD  sevelamer carbonate (RENVELA) 800 MG tablet Take 2,400 mg by mouth 3 (three) times daily with meals.  08/13/18   [provider]     Anti-infectives (From admission, onward)   Start     Dose/Rate Route Frequency Ordered Stop   01/07/19 1700  remdesivir 100 mg in sodium chloride 0.9 % 100 mL IVPB     100 mg 200 mL/hr over 30 Minutes Intravenous Daily 01/06/19 1629 01/11/19 1659   01/06/19 1700  remdesivir 200 mg in sodium chloride 0.9% 250 mL IVPB     200 mg 580 mL/hr over 30 Minutes Intravenous Once 01/06/19 1629        Results for orders placed or performed during the hospital encounter of  01/06/19 (from the past 48 hour(s))  Basic metabolic panel     Status: Abnormal   Collection Time: 01/06/19  1:49 PM  Result Value Ref Range   Sodium 134 (L) 135 - 145 mmol/L   Potassium 4.4 3.5 - 5.1 mmol/L   Chloride 98 98 - 111 mmol/L   CO2 18 (L) 22 - 32 mmol/L   Glucose, Bld 287 (H) 70 - 99 mg/dL   BUN 108 (H) 8 - 23 mg/dL   Creatinine, Ser 11.31 (H) 0.61 - 1.24 mg/dL   Calcium 7.7 (L) 8.9 - 10.3 mg/dL   GFR calc non Af Amer 4 (L) >60 mL/min   GFR calc Af Amer 5 (L) >60 mL/min   Anion gap 18 (H) 5 - 15    Comment: Performed at [facility], [address] [place], [place] [address]  CBC     Status: Abnormal   Collection Time: 01/06/19  1:49 PM  Result Value Ref Range   WBC 6.4 4.0 - 10.5 K/uL   RBC 3.51 (L) 4.22 - 5.81 MIL/uL   Hemoglobin 10.4 (L) 13.0 - 17.0 g/dL   HCT 32.5 (L) 39.0 - 52.0 %   MCV 92.6 80.0 - 100.0 fL   MCH 29.6 26.0 - 34.0 pg   MCHC 32.0 30.0 - 36.0 g/dL   RDW 15.7 (H) 11.5 - 15.5 %   Platelets 204 150 - 400 K/uL   nRBC 0.0 0.0 - 0.2 %    Comment: Performed at [facility] [address], [place], [place] [address]  Troponin I (High Sensitivity)     Status: Abnormal   Collection Time: 01/06/19  1:49 PM  Result Value Ref Range   Troponin I (High Sensitivity) 182 (HH) <18 ng/L    Comment: CRITICAL RESULT CALLED TO, READ BACK BY AND VERIFIED WITH: J BLUE,RN 1529 01/06/2019 D BRADLEY (NOTE) Elevated high sensitivity troponin I (hsTnI) values and significant  changes across serial measurements may suggest ACS but many other  chronic and acute conditions are known to elevate hsTnI results.  Refer to the Links section for chest pain algorithms and additional  guidance. Performed at [facility], [place] [address], [place], [place] [address]   POC SARS Coronavirus 2 Ag-ED - Nasal Swab (BD Veritor Kit)     Status: Abnormal   Collection Time: 01/06/19  3:45 PM  Result Value Ref Range   SARS Coronavirus 2 Ag POSITIVE (A) NEGATIVE     Comment: (NOTE) SARS-CoV-2 antigen PRESENT. Positive results indicate the presence of viral antigens, but clinical correlation with patient history  and other diagnostic information is necessary to determine patient infection status.  Positive results do not rule out bacterial infection or co-infection  with other viruses. False positive results are rare but can occur, and confirmatory RT-PCR testing may be appropriate in some circumstances. The expected result is Negative. Fact Sheet for Patients: PodPark.tn Fact Sheet for Providers: GiftContent.is  This test is not yet approved or cleared by the [place] FDA and  has been authorized for detection and/or diagnosis of SARS-CoV-2 by FDA under an Emergency Use Authorization (EUA).  This EUA will remain in effect (meaning this test can be used) for the duration of  the COVID-19 declaration under Section 564(b)(1) of the Act, 21 U.S.C. section 360bbb-3(b)(1), unless the a uthorization is terminated or revoked sooner.     ROS: See HPI, unable to complete due to COVID + status.In order to preserve PPE equipment and to minimize exposure to providers.Notes from other caregivers reviewed  Physical Exam: Vitals:   01/06/19 1545 01/06/19 1604  BP: (!) 158/64 (!) 158/64  Pulse: 67 69  Resp: (!) 28 (!) 42  Temp:    SpO2: (!) 89% 94%     Physical Exam Unable to complete due to COVID + status.In order to preserve PPE equipment and to minimize exposure to providers.Notes from other caregivers reviewed   Dialysis Orders: Center: [facility]  on TTS . EDW 85  HD Bath 3k, 2 ca  Time 4hr 15 min  [id number] . Access  L RC AVF   / trouble with cannulating / ( CKV APT Pend )  Using perm cath  Calcitriol 0.5   mcg po /HD  Mircera 60  ( last on 12/20/18 )  Venofer 50 q wk   Assessment/Plan 1. Sob / Covid Resp. Illness = per admit team  RX  2. ESRD -  Hd tts , 3. Hypertension/volume   - bp meds and volume on Hd   4. Anemia  -  HGB 10.4  Fu trend  esa Thursday hd  5. Metabolic bone disease -  Renvela binder/ po vit d on hd fu labs   6. CAD /  AOS / CM  Ef 40 % - uf as tolerated / "cxr withut overt edema "  Ernest Haber, PA-C [facility] Beeper [phone] 01/06/2019, 4:29 PM   Nephrology attending: Chart and lab results reviewed. I agree with th consult note as above.  ESRD on HD, missed HD session Acute hypoxia due to covid-19 infection and mild pulm edema. K ok Plan for HD tomorrow as scheduled. Resume home medications.  Lawson Radar, MD

## 2019-01-06 NOTE — ED Notes (Signed)
Troponin 182

## 2019-01-07 ENCOUNTER — Encounter (HOSPITAL_COMMUNITY): Payer: Self-pay | Admitting: Internal Medicine

## 2019-01-07 ENCOUNTER — Other Ambulatory Visit: Payer: Self-pay

## 2019-01-07 LAB — CBG MONITORING, ED
Glucose-Capillary: 214 mg/dL — ABNORMAL HIGH (ref 70–99)
Glucose-Capillary: 215 mg/dL — ABNORMAL HIGH (ref 70–99)

## 2019-01-07 LAB — CBC WITH DIFFERENTIAL/PLATELET
Abs Immature Granulocytes: 0.04 10*3/uL (ref 0.00–0.07)
Basophils Absolute: 0 10*3/uL (ref 0.0–0.1)
Basophils Relative: 0 %
Eosinophils Absolute: 0 10*3/uL (ref 0.0–0.5)
Eosinophils Relative: 0 %
HCT: 28.7 % — ABNORMAL LOW (ref 39.0–52.0)
Hemoglobin: 9.1 g/dL — ABNORMAL LOW (ref 13.0–17.0)
Immature Granulocytes: 1 %
Lymphocytes Relative: 3 %
Lymphs Abs: 0.3 10*3/uL — ABNORMAL LOW (ref 0.7–4.0)
MCH: 29.8 pg (ref 26.0–34.0)
MCHC: 31.7 g/dL (ref 30.0–36.0)
MCV: 94.1 fL (ref 80.0–100.0)
Monocytes Absolute: 0.2 10*3/uL (ref 0.1–1.0)
Monocytes Relative: 3 %
Neutro Abs: 7.9 10*3/uL — ABNORMAL HIGH (ref 1.7–7.7)
Neutrophils Relative %: 93 %
Platelets: 266 10*3/uL (ref 150–400)
RBC: 3.05 MIL/uL — ABNORMAL LOW (ref 4.22–5.81)
RDW: 15.9 % — ABNORMAL HIGH (ref 11.5–15.5)
WBC: 8.5 10*3/uL (ref 4.0–10.5)
nRBC: 0 % (ref 0.0–0.2)

## 2019-01-07 LAB — HEMOGLOBIN A1C
Hgb A1c MFr Bld: 6.4 % — ABNORMAL HIGH (ref 4.8–5.6)
Mean Plasma Glucose: 136.98 mg/dL

## 2019-01-07 LAB — COMPREHENSIVE METABOLIC PANEL
ALT: 32 U/L (ref 0–44)
AST: 47 U/L — ABNORMAL HIGH (ref 15–41)
Albumin: 2.5 g/dL — ABNORMAL LOW (ref 3.5–5.0)
Alkaline Phosphatase: 70 U/L (ref 38–126)
Anion gap: 20 — ABNORMAL HIGH (ref 5–15)
BUN: 124 mg/dL — ABNORMAL HIGH (ref 8–23)
CO2: 17 mmol/L — ABNORMAL LOW (ref 22–32)
Calcium: 7.7 mg/dL — ABNORMAL LOW (ref 8.9–10.3)
Chloride: 101 mmol/L (ref 98–111)
Creatinine, Ser: 11.81 mg/dL — ABNORMAL HIGH (ref 0.61–1.24)
GFR calc Af Amer: 5 mL/min — ABNORMAL LOW (ref >60)
GFR calc non Af Amer: 4 mL/min — ABNORMAL LOW (ref >60)
Glucose, Bld: 237 mg/dL — ABNORMAL HIGH (ref 70–99)
Potassium: 5.1 mmol/L (ref 3.5–5.1)
Sodium: 138 mmol/L (ref 135–145)
Total Bilirubin: 0.8 mg/dL (ref 0.3–1.2)
Total Protein: 6.6 g/dL (ref 6.5–8.1)

## 2019-01-07 LAB — URINE CULTURE: Culture: NO GROWTH

## 2019-01-07 LAB — C-REACTIVE PROTEIN: CRP: 29.7 mg/dL — ABNORMAL HIGH (ref <1.0)

## 2019-01-07 LAB — GLUCOSE, CAPILLARY
Glucose-Capillary: 275 mg/dL — ABNORMAL HIGH (ref 70–99)
Glucose-Capillary: 289 mg/dL — ABNORMAL HIGH (ref 70–99)
Glucose-Capillary: 301 mg/dL — ABNORMAL HIGH (ref 70–99)

## 2019-01-07 LAB — BRAIN NATRIURETIC PEPTIDE: B Natriuretic Peptide: 2397.5 pg/mL — ABNORMAL HIGH (ref 0.0–100.0)

## 2019-01-07 LAB — D-DIMER, QUANTITATIVE: D-Dimer, Quant: 3.16 ug/mL-FEU — ABNORMAL HIGH (ref 0.00–0.50)

## 2019-01-07 LAB — PHOSPHORUS: Phosphorus: 5.6 mg/dL — ABNORMAL HIGH (ref 2.5–4.6)

## 2019-01-07 LAB — FERRITIN: Ferritin: 6353 ng/mL — ABNORMAL HIGH (ref 24–336)

## 2019-01-07 LAB — PROCALCITONIN: Procalcitonin: 1.53 ng/mL

## 2019-01-07 LAB — MAGNESIUM: Magnesium: 2.3 mg/dL (ref 1.7–2.4)

## 2019-01-07 MED ORDER — ALBUTEROL SULFATE HFA 108 (90 BASE) MCG/ACT IN AERS
2.0000 | INHALATION_SPRAY | Freq: Three times a day (TID) | RESPIRATORY_TRACT | Status: DC
  Administered 2019-01-07 – 2019-01-13 (×16): 2 via RESPIRATORY_TRACT
  Filled 2019-01-07: qty 6.7

## 2019-01-07 MED ORDER — GUAIFENESIN-DM 100-10 MG/5ML PO SYRP
10.0000 mL | ORAL_SOLUTION | Freq: Two times a day (BID) | ORAL | Status: AC
  Administered 2019-01-07 – 2019-01-09 (×4): 10 mL via ORAL
  Filled 2019-01-07 (×5): qty 10

## 2019-01-07 MED ORDER — INSULIN DETEMIR 100 UNIT/ML ~~LOC~~ SOLN
3.0000 [IU] | Freq: Two times a day (BID) | SUBCUTANEOUS | Status: DC
  Administered 2019-01-07 – 2019-01-08 (×3): 3 [IU] via SUBCUTANEOUS
  Filled 2019-01-07 (×4): qty 0.03

## 2019-01-07 MED ORDER — CHLORHEXIDINE GLUCONATE CLOTH 2 % EX PADS
6.0000 | MEDICATED_PAD | Freq: Every day | CUTANEOUS | Status: DC
  Administered 2019-01-07 – 2019-01-12 (×3): 6 via TOPICAL

## 2019-01-07 MED ORDER — IPRATROPIUM BROMIDE HFA 17 MCG/ACT IN AERS
2.0000 | INHALATION_SPRAY | Freq: Three times a day (TID) | RESPIRATORY_TRACT | Status: DC
  Administered 2019-01-07 – 2019-01-13 (×17): 2 via RESPIRATORY_TRACT
  Filled 2019-01-07: qty 12.9

## 2019-01-07 MED ORDER — DEXAMETHASONE SODIUM PHOSPHATE 10 MG/ML IJ SOLN
6.0000 mg | INTRAMUSCULAR | Status: DC
  Administered 2019-01-08 – 2019-01-13 (×5): 6 mg via INTRAVENOUS
  Filled 2019-01-07 (×7): qty 1

## 2019-01-07 NOTE — ED Notes (Signed)
Paged Provider regarding pts tachypnea and switching to NRB. Awaiting further orders.

## 2019-01-07 NOTE — Progress Notes (Signed)
[facility] NEPHROLOGY PROGRESS NOTE  Assessment/ Plan: Pt is a 69 y.o. yo male with DM, HTN, CHF, AAS, ESRD on HD, missed 3 HD treatment with COVID-19 PNA.  Dialysis Orders: Center: east  on TTS . EDW 85  HD Bath 3k, 2 ca  Time 4hr 15 min  [id number] . Access  L RC AVF   / trouble with cannulating / ( CKV APT Pend )  Using perm cath  Calcitriol 0.5   mcg po /HD  Mircera 60  ( last on 12/20/18 )  Venofer 50 q wk  #Acute COVID-19 pneumonia with hypoxia: Currently on oxygen, remdesivir and steroid.  Per primary team.  # ESRD, TTS plan for HD today.    # Anemia of CKD: Continue ESA.  Discontinue iron because of infection/with high ferritin level.  Monitor hemoglobin.  # Secondary hyperparathyroidism: Continue Renvela.  Low phosphorus diet.  # HTN/volume: Continue current medication.  Ultrafiltration during dialysis.  Subjective: Chart reviewed including lab results, medication and imaging studies.  Patient was not directly examined because of COVID-19 status in order to minimize exposure and to preserve PPE.  The physical examination of the primary team noted. Objective Vital signs in last 24 hours: Vitals:   01/07/19 0515 01/07/19 0545 01/07/19 0730 01/07/19 0904  BP: (!) 154/56 (!) 155/59 (!) 161/66 (!) 158/56  Pulse: (!) 58 60 62 (!) 57  Resp: (!) 29 (!) 35 (!) 23 18  Temp:    98.3 F (36.8 C)  TempSrc:      SpO2: 96% 95% 95% 100%   Weight change:  No intake or output data in the 24 hours ending 01/07/19 1035     Labs: Basic Metabolic Panel: Recent Labs  Lab 01/06/19 1349 01/07/19 0647  NA 134* 138  K 4.4 5.1  CL 98 101  CO2 18* 17*  GLUCOSE 287* 237*  BUN 108* 124*  CREATININE 11.31* 11.81*  CALCIUM 7.7* 7.7*  PHOS  --  5.6*   Liver Function Tests: Recent Labs  Lab 01/07/19 0647  AST 47*  ALT 32  ALKPHOS 70  BILITOT 0.8  PROT 6.6  ALBUMIN 2.5*   No results for input(s): LIPASE, AMYLASE in the last 168 hours. No results for input(s):  AMMONIA in the last 168 hours. CBC: Recent Labs  Lab 01/06/19 1349 01/07/19 0647  WBC 6.4 8.5  NEUTROABS  --  7.9*  HGB 10.4* 9.1*  HCT 32.5* 28.7*  MCV 92.6 94.1  PLT 204 266   Cardiac Enzymes: No results for input(s): CKTOTAL, CKMB, CKMBINDEX, TROPONINI in the last 168 hours. CBG: Recent Labs  Lab 01/06/19 2002 01/06/19 2238 01/07/19 0033 01/07/19 0800  GLUCAP 237* 224* 214* 215*    Iron Studies:  Recent Labs    01/07/19 0647  FERRITIN 6,353*   Studies/Results: DG Chest Portable 1 View  Result Date: 01/06/2019 CLINICAL DATA:  Short of breath. EXAM: PORTABLE CHEST 1 VIEW COMPARISON:  04/24/2018 FINDINGS: A new right jugular catheter terminates over the superior cavoatrial junction/high right atrium. The cardiac silhouette is moderately enlarged. There is mild central pulmonary vascular congestion without overt edema. No confluent airspace opacity, sizeable pleural effusion, or pneumothorax is identified. No acute osseous abnormality is seen. IMPRESSION: Cardiomegaly with mild pulmonary vascular congestion. Electronically Signed   By: Logan Bores M.D.   On: 01/06/2019 14:29    Medications: Infusions: . [START ON 01/09/2019] ferric gluconate (FERRLECIT/NULECIT) IV    . remdesivir 100 mg in NS 100 mL  Scheduled Medications: . albuterol  2 puff Inhalation Q6H  . aspirin EC  81 mg Oral Daily  . calcitRIOL  0.5 mcg Oral Q T,Th,Sa-HD  . Chlorhexidine Gluconate Cloth  6 each Topical Daily  . [START ON 01/09/2019] darbepoetin (ARANESP) injection - DIALYSIS  60 mcg Intravenous Q Thu-HD  . dexamethasone  6 mg Oral Q24H  . docusate sodium  100 mg Oral BID  . heparin injection (subcutaneous)  5,000 Units Subcutaneous Q8H  . hydrALAZINE  37.5 mg Oral TID  . insulin aspart  0-15 Units Subcutaneous TID WC  . insulin aspart  0-5 Units Subcutaneous QHS  . isosorbide mononitrate  30 mg Oral Daily  . metoprolol succinate  50 mg Oral Daily  . rosuvastatin  40 mg Oral Daily  .  sevelamer carbonate  2,400 mg Oral TID WC    have reviewed scheduled and prn medications.  Parthenia Tellefsen Prasad Dennies Coate 01/07/2019,10:35 AM  LOS: 1 day  Pager: ID:[phone]

## 2019-01-07 NOTE — ED Notes (Signed)
C+/Tele Breakfast ordered

## 2019-01-07 NOTE — ED Notes (Signed)
hospitalist at bedside

## 2019-01-07 NOTE — Progress Notes (Signed)
PROGRESS NOTE  Justin [name] [id number] DOB: 17-Jan-1950 DOA: 01/06/2019 PCP: Reynold Bowen, MD  HPI/Recap of past 24 hours: Justin [name] is a 69 y.o. male with medical history significant of ESRD on HD, TTS, IDDM, HTN, HLD, presented with increasing short of breath for 1 week.   Associated with generalized weakness and productive cough.  O2 saturation 83% on room air in the ED.  Chest x-ray showed multifocal infiltrates.  COVID-19 screening test positive on 01/06/2019.  Started on COVID-19 directed therapies.    01/07/19: Seen and examined while in the ED.  Reports persistent productive cough.  Denies any chest pain at this time.  Planned hemodialysis today.    Assessment/Plan: Active Problems:   COVID-19 virus infection   COVID-19   Acute COVID-19 viral pneumonia Presented with shortness of breath, hypoxia with positive COVID-19 in-house test on 01/06/2019. Independently viewed chest x-ray done on admission which shows bilateral pulmonary infiltrates. Started on remdesivir day number 2 out of 5, Decadron day number 2 out of 10. Switch Decadron to IV Added bronchodilators albuterol inhaler 2 puffs 3 times daily and ipratropium inhaler 2 puffs 3 times daily Added Robitussin syrup twice daily x3 days Obtain procalcitonin level and BNP Currently on high flow nasal cannula 7 L  Continue oxygen supplementation to maintain O2 saturation greater than 94% Start incentive spirometer and flutter valve Elevated inflammatory markers, continue to trend  Acute hypoxic respiratory failure secondary to acute COVID-19 viral pneumonia Not on oxygen supplementation at baseline Rest of management as stated above.  ESRD on HD TTS Hemodialysis planned today Nephrology following  Chronic systolic CHF Last 2D echo done on 04/25/2018 showed LVEF 35 to 40% with inferior septal hypokinesis Continue cardiac medications Volume status addressed with hemodialysis Obtain BNP  Essential  hypertension Continue home medications Continue to monitor vital signs  Insulin-dependent type 2 diabetes Hemoglobin A1c Continue insulin coverage Avoid hypoglycemia  DVT prophylaxis: Subcu heparin 3 times daily Code Status: Full code Family Communication: None at bedside  Disposition Plan:  Patient is currently not appropriate for discharge at this time due to ongoing treatment for COVID-19 viral pneumonia.  Patient will require at least 2 midnights for further evaluation and treatment of present condition. Consults called: Nephrology     Objective: Vitals:   01/07/19 0515 01/07/19 0545 01/07/19 0730 01/07/19 0904  BP: (!) 154/56 (!) 155/59 (!) 161/66 (!) 158/56  Pulse: (!) 58 60 62 (!) 57  Resp: (!) 29 (!) 35 (!) 23 18  Temp:    98.3 F (36.8 C)  TempSrc:      SpO2: 96% 95% 95% 100%   No intake or output data in the 24 hours ending 01/07/19 1108 There were no vitals filed for this visit.  Exam:  . General: 69 y.o. year-old male well developed well nourished in no acute distress.  Alert and oriented x3. . Cardiovascular: Regular rate and rhythm with no rubs or gallops.  No thyromegaly or JVD noted.   [name] Kitchen Respiratory: Diffuse mild rales.  No wheezing noted.  Good respiratory effort. . Abdomen: Soft nontender nondistended with normal bowel sounds x4 quadrants. . Musculoskeletal: No lower extremity edema.  [name] Kitchen Psychiatry: Mood is appropriate for condition and setting   Data Reviewed: CBC: Recent Labs  Lab 01/06/19 1349 01/07/19 0647  WBC 6.4 8.5  NEUTROABS  --  7.9*  HGB 10.4* 9.1*  HCT 32.5* 28.7*  MCV 92.6 94.1  PLT 204 [id number]   Basic Metabolic Panel: Recent Labs  Lab 01/06/19 1349 01/07/19 0647  NA 134* 138  K 4.4 5.1  CL 98 101  CO2 18* 17*  GLUCOSE 287* 237*  BUN 108* 124*  CREATININE 11.31* 11.81*  CALCIUM 7.7* 7.7*  MG  --  2.3  PHOS  --  5.6*   GFR: CrCl cannot be calculated (Unknown ideal weight.). Liver Function Tests: Recent Labs  Lab  01/07/19 0647  AST 47*  ALT 32  ALKPHOS 70  BILITOT 0.8  PROT 6.6  ALBUMIN 2.5*   No results for input(s): LIPASE, AMYLASE in the last 168 hours. No results for input(s): AMMONIA in the last 168 hours. Coagulation Profile: No results for input(s): INR, PROTIME in the last 168 hours. Cardiac Enzymes: No results for input(s): CKTOTAL, CKMB, CKMBINDEX, TROPONINI in the last 168 hours. BNP (last 3 results) No results for input(s): PROBNP in the last 8760 hours. HbA1C: No results for input(s): HGBA1C in the last 72 hours. CBG: Recent Labs  Lab 01/06/19 2002 01/06/19 2238 01/07/19 0033 01/07/19 0800  GLUCAP 237* 224* 214* 215*   Lipid Profile: No results for input(s): CHOL, HDL, LDLCALC, TRIG, CHOLHDL, LDLDIRECT in the last 72 hours. Thyroid Function Tests: No results for input(s): TSH, T4TOTAL, FREET4, T3FREE, THYROIDAB in the last 72 hours. Anemia Panel: Recent Labs    01/06/19 2205 01/07/19 0647  FERRITIN 5,249* 6,353*   Urine analysis:    Component Value Date/Time   COLORURINE YELLOW 01/06/2019 1600   APPEARANCEUR CLEAR 01/06/2019 1600   LABSPEC 1.014 01/06/2019 1600   PHURINE 5.0 01/06/2019 1600   GLUCOSEU 150 (A) 01/06/2019 1600   HGBUR SMALL (A) 01/06/2019 1600   BILIRUBINUR NEGATIVE 01/06/2019 1600   KETONESUR NEGATIVE 01/06/2019 1600   PROTEINUR 100 (A) 01/06/2019 1600   NITRITE NEGATIVE 01/06/2019 1600   LEUKOCYTESUR NEGATIVE 01/06/2019 1600   Sepsis Labs: @LABRCNTIP (procalcitonin:4,lacticidven:4)  )No results found for this or any previous visit (from the past 240 hour(s)).    Studies: DG Chest Portable 1 View  Result Date: 01/06/2019 CLINICAL DATA:  Short of breath. EXAM: PORTABLE CHEST 1 VIEW COMPARISON:  04/24/2018 FINDINGS: A new right jugular catheter terminates over the superior cavoatrial junction/high right atrium. The cardiac silhouette is moderately enlarged. There is mild central pulmonary vascular congestion without overt edema. No  confluent airspace opacity, sizeable pleural effusion, or pneumothorax is identified. No acute osseous abnormality is seen. IMPRESSION: Cardiomegaly with mild pulmonary vascular congestion. Electronically Signed   By: Logan Bores M.D.   On: 01/06/2019 14:29    Scheduled Meds: . albuterol  2 puff Inhalation Q6H  . aspirin EC  81 mg Oral Daily  . calcitRIOL  0.5 mcg Oral Q T,Th,Sa-HD  . Chlorhexidine Gluconate Cloth  6 each Topical Daily  . [START ON 01/09/2019] darbepoetin (ARANESP) injection - DIALYSIS  60 mcg Intravenous Q Thu-HD  . dexamethasone  6 mg Oral Q24H  . docusate sodium  100 mg Oral BID  . heparin injection (subcutaneous)  5,000 Units Subcutaneous Q8H  . hydrALAZINE  37.5 mg Oral TID  . insulin aspart  0-15 Units Subcutaneous TID WC  . insulin aspart  0-5 Units Subcutaneous QHS  . isosorbide mononitrate  30 mg Oral Daily  . metoprolol succinate  50 mg Oral Daily  . rosuvastatin  40 mg Oral Daily  . sevelamer carbonate  2,400 mg Oral TID WC    Continuous Infusions: . remdesivir 100 mg in NS 100 mL       LOS: 1 day  Kayleen Memos, MD [facility] Pager [phone]  If 7PM-7AM, please contact night-coverage [email or web address] Password [email or web address] 01/07/2019, 11:08 AM

## 2019-01-07 NOTE — Progress Notes (Addendum)
Inpatient Diabetes Program Recommendations  AACE/ADA: New Consensus Statement on Inpatient Glycemic Control (2015)  Target Ranges:  Prepandial:   less than 140 mg/dL      Peak postprandial:   less than 180 mg/dL (1-2 hours)      Critically ill patients:  140 - 180 mg/dL   Lab Results  Component Value Date   GLUCAP 215 (H) 01/07/2019   HGBA1C 6.2 (H) 04/25/2018    Review of Glycemic Control Results for RAFFI, SLISZ (MRN [id number]) as of 01/07/2019 11:01  Ref. Range 01/06/2019 20:02 01/06/2019 22:38 01/07/2019 00:33 01/07/2019 08:00  Glucose-Capillary Latest Ref Range: 70 - 99 mg/dL 237 (H) 224 (H) 214 (H) 215 (H)   Diabetes history: DM 2 Outpatient Diabetes medications: 70/30 20 units q AM Current orders for Inpatient glycemic control:  Novolog moderate tid with meals and HS Decadron 6 mg daily  Inpatient Diabetes Program Recommendations:    -May consider adding Levemir 6 units bid.  -Also consider adding Novolog 2 units tid with meals (to cover meal intake).   Thanks  Adah Perl, RN, BC-ADM Inpatient Diabetes Coordinator Pager [phone] (8a-5p)

## 2019-01-07 NOTE — ED Notes (Signed)
This RN attempted twice to draw blood unsuccessfully, notified phlebotomy.

## 2019-01-08 LAB — CBC WITH DIFFERENTIAL/PLATELET
Abs Immature Granulocytes: 0.08 10*3/uL — ABNORMAL HIGH (ref 0.00–0.07)
Basophils Absolute: 0 10*3/uL (ref 0.0–0.1)
Basophils Relative: 0 %
Eosinophils Absolute: 0 10*3/uL (ref 0.0–0.5)
Eosinophils Relative: 0 %
HCT: 27.9 % — ABNORMAL LOW (ref 39.0–52.0)
Hemoglobin: 9.5 g/dL — ABNORMAL LOW (ref 13.0–17.0)
Immature Granulocytes: 1 %
Lymphocytes Relative: 3 %
Lymphs Abs: 0.3 10*3/uL — ABNORMAL LOW (ref 0.7–4.0)
MCH: 30.4 pg (ref 26.0–34.0)
MCHC: 34.1 g/dL (ref 30.0–36.0)
MCV: 89.1 fL (ref 80.0–100.0)
Monocytes Absolute: 0.4 10*3/uL (ref 0.1–1.0)
Monocytes Relative: 4 %
Neutro Abs: 10.3 10*3/uL — ABNORMAL HIGH (ref 1.7–7.7)
Neutrophils Relative %: 92 %
Platelets: 346 10*3/uL (ref 150–400)
RBC: 3.13 MIL/uL — ABNORMAL LOW (ref 4.22–5.81)
RDW: 15.6 % — ABNORMAL HIGH (ref 11.5–15.5)
WBC: 11.1 10*3/uL — ABNORMAL HIGH (ref 4.0–10.5)
nRBC: 0 % (ref 0.0–0.2)

## 2019-01-08 LAB — PHOSPHORUS: Phosphorus: 6 mg/dL — ABNORMAL HIGH (ref 2.5–4.6)

## 2019-01-08 LAB — HEMOGLOBIN A1C
Hgb A1c MFr Bld: 5.9 % — ABNORMAL HIGH (ref 4.8–5.6)
Mean Plasma Glucose: 123 mg/dL

## 2019-01-08 LAB — COMPREHENSIVE METABOLIC PANEL
ALT: 35 U/L (ref 0–44)
AST: 51 U/L — ABNORMAL HIGH (ref 15–41)
Albumin: 2.5 g/dL — ABNORMAL LOW (ref 3.5–5.0)
Alkaline Phosphatase: 85 U/L (ref 38–126)
Anion gap: 18 — ABNORMAL HIGH (ref 5–15)
BUN: 97 mg/dL — ABNORMAL HIGH (ref 8–23)
CO2: 22 mmol/L (ref 22–32)
Calcium: 7.9 mg/dL — ABNORMAL LOW (ref 8.9–10.3)
Chloride: 96 mmol/L — ABNORMAL LOW (ref 98–111)
Creatinine, Ser: 8.82 mg/dL — ABNORMAL HIGH (ref 0.61–1.24)
GFR calc Af Amer: 6 mL/min — ABNORMAL LOW (ref >60)
GFR calc non Af Amer: 6 mL/min — ABNORMAL LOW (ref >60)
Glucose, Bld: 241 mg/dL — ABNORMAL HIGH (ref 70–99)
Potassium: 4.3 mmol/L (ref 3.5–5.1)
Sodium: 136 mmol/L (ref 135–145)
Total Bilirubin: 0.8 mg/dL (ref 0.3–1.2)
Total Protein: 6.8 g/dL (ref 6.5–8.1)

## 2019-01-08 LAB — MRSA PCR SCREENING: MRSA by PCR: NEGATIVE

## 2019-01-08 LAB — GLUCOSE, CAPILLARY
Glucose-Capillary: 214 mg/dL — ABNORMAL HIGH (ref 70–99)
Glucose-Capillary: 273 mg/dL — ABNORMAL HIGH (ref 70–99)
Glucose-Capillary: 265 mg/dL — ABNORMAL HIGH (ref 70–99)
Glucose-Capillary: 335 mg/dL — ABNORMAL HIGH (ref 70–99)

## 2019-01-08 LAB — C-REACTIVE PROTEIN: CRP: 22.7 mg/dL — ABNORMAL HIGH (ref <1.0)

## 2019-01-08 LAB — FERRITIN: Ferritin: 7018 ng/mL — ABNORMAL HIGH (ref 24–336)

## 2019-01-08 LAB — D-DIMER, QUANTITATIVE: D-Dimer, Quant: 3.13 ug/mL-FEU — ABNORMAL HIGH (ref 0.00–0.50)

## 2019-01-08 LAB — MAGNESIUM: Magnesium: 2.2 mg/dL (ref 1.7–2.4)

## 2019-01-08 MED ORDER — HYDRALAZINE HCL 25 MG PO TABS
25.0000 mg | ORAL_TABLET | Freq: Three times a day (TID) | ORAL | Status: DC
  Administered 2019-01-08 (×3): 25 mg via ORAL
  Filled 2019-01-08 (×3): qty 1

## 2019-01-08 MED ORDER — CHLORHEXIDINE GLUCONATE CLOTH 2 % EX PADS
6.0000 | MEDICATED_PAD | Freq: Every day | CUTANEOUS | Status: DC
  Administered 2019-01-08: 6 via TOPICAL

## 2019-01-08 MED ORDER — INSULIN DETEMIR 100 UNIT/ML ~~LOC~~ SOLN
4.0000 [IU] | Freq: Two times a day (BID) | SUBCUTANEOUS | Status: DC
  Administered 2019-01-08: 4 [IU] via SUBCUTANEOUS
  Filled 2019-01-08 (×3): qty 0.04

## 2019-01-08 MED ORDER — METOPROLOL SUCCINATE ER 25 MG PO TB24
12.5000 mg | ORAL_TABLET | Freq: Every day | ORAL | Status: DC
  Filled 2019-01-08: qty 1

## 2019-01-08 MED ORDER — PIPERACILLIN-TAZOBACTAM IN DEX 2-0.25 GM/50ML IV SOLN
2.2500 g | Freq: Three times a day (TID) | INTRAVENOUS | Status: DC
  Administered 2019-01-08 – 2019-01-12 (×10): 2.25 g via INTRAVENOUS
  Filled 2019-01-08 (×13): qty 50

## 2019-01-08 MED ORDER — VANCOMYCIN HCL 1500 MG/300ML IV SOLN
1500.0000 mg | Freq: Once | INTRAVENOUS | Status: DC
  Filled 2019-01-08: qty 300

## 2019-01-08 MED ORDER — INSULIN ASPART 100 UNIT/ML ~~LOC~~ SOLN
2.0000 [IU] | Freq: Three times a day (TID) | SUBCUTANEOUS | Status: DC
  Administered 2019-01-08: 2 [IU] via SUBCUTANEOUS

## 2019-01-08 MED ORDER — VANCOMYCIN HCL IN DEXTROSE 750-5 MG/150ML-% IV SOLN
750.0000 mg | INTRAVENOUS | Status: DC
  Filled 2019-01-08: qty 150

## 2019-01-08 MED ORDER — ISOSORBIDE MONONITRATE ER 30 MG PO TB24
30.0000 mg | ORAL_TABLET | Freq: Every day | ORAL | Status: DC
  Filled 2019-01-08: qty 1

## 2019-01-08 MED ORDER — RENA-VITE PO TABS
1.0000 | ORAL_TABLET | Freq: Every day | ORAL | Status: DC
  Administered 2019-01-08 – 2019-01-12 (×5): 1 via ORAL
  Filled 2019-01-08 (×5): qty 1

## 2019-01-08 NOTE — Progress Notes (Signed)
PROGRESS NOTE  Justin [name] [id number] DOB: 1950-12-20 DOA: 01/06/2019 PCP: Reynold Bowen, MD  HPI/Recap of past 24 hours: Justin [name] is a 69 y.o. male with medical history significant of ESRD on HD, TTS, IDDM, HTN, HLD, presented with increasing short of breath for 1 week.   Associated with generalized weakness and productive cough.  O2 saturation 83% on room air in the ED.  Chest x-ray showed multifocal infiltrates.  COVID-19 screening test positive on 01/06/2019.  Started on COVID-19 directed therapies.    01/08/19: Seen and examined.  States he feels tired from not getting enough sleep after completing hemodialysis early this morning.  Persistent cough.  Denies chest pain or dyspnea at rest.  Increased oxygen requirement now on high flow nasal cannula 10 L.   Assessment/Plan: Active Problems:   COVID-19 virus infection   COVID-19   Acute COVID-19 viral pneumonia superimposed by HCAP, POA Presented with shortness of breath, hypoxia with positive COVID-19 in-house test on 01/06/2019. Continue remdesivir day number 3 out of 5, Decadron day number 2 out of 10. Continue IV Decadron 6 mg daily x10 days.  Procalcitonin elevated 1.53  BNP also significantly elevated greater than 2300 Volume status addressed by hemodialysis. Start IV Vancomycin and IV Zosyn on 01/08/2019. Continue bronchodilators albuterol inhaler 2 puffs 3 times daily and ipratropium inhaler 2 puffs 3 times daily Continue Robitussin syrup twice daily x3 days Oxygen requirement increasing to 10 L Maintain O2 saturation greater than 94% Continue incentive spirometer and flutter valve Elevated inflammatory markers, continue to trend. Reviewed chest x-ray done on admission which showed bilateral patchy pulmonary infiltrates and increase in pulmonary vascularity with cardiomegaly..  Acute hypoxic respiratory failure secondary to acute COVID-19 viral pneumonia Not on oxygen supplementation at baseline Rest of management as  stated above.  ESRD on HD TTS Hemodialysis planned today Nephrology following  Acute on chronic systolic CHF Last 2D echo done on 04/25/2018 showed LVEF 35 to 40% with inferior septal hypokinesis Chest x-ray admission showing cardiomegaly with increase in pulmonary vascularity. Elevated BNP greater than 2300. Volume status addressed by hemodialysis Plan for next HD tomorrow.  Essential hypertension Continue home medications Continue to monitor vital signs  Insulin-dependent type 2 diabetes Hemoglobin A1c 6.4 on 01/07/2019. Continue insulin coverage Avoid hypoglycemia  DVT prophylaxis: Subcu heparin 3 times daily Code Status: Full code Family Communication: None at bedside  Disposition Plan:  Will plan for discharge once respiratory status is close to baseline. Consults called: Nephrology     Objective: Vitals:   01/07/19 2330 01/08/19 0000 01/08/19 0539 01/08/19 0844  BP: (!) 129/55 (!) 125/50 (!) 166/59 (!) 178/65  Pulse: (!) 55 (!) 52 (!) 58 (!) 58  Resp:   20 20  Temp:  98 F (36.7 C) 97.9 F (36.6 C) 98.4 F (36.9 C)  TempSrc:  Oral Oral Oral  SpO2:   93% 92%  Weight:  82 kg      Intake/Output Summary (Last 24 hours) at 01/08/2019 1144 Last data filed at 01/08/2019 1000 Gross per 24 hour  Intake --  Output 2550 ml  Net -2550 ml   Filed Weights   01/07/19 2100 01/08/19 0000  Weight: 84 kg 82 kg    Exam:  . General: 69 y.o. year-old male pleasant well-developed well-nourished in no acute distress.  Alert and oriented x3.   . Cardiovascular: Regular rate and rhythm no rubs or gallops.   [name] Kitchen Respiratory: Diffuse rales bilaterally no wheezing noted.  Poor inspiratory effort. [name] Kitchen  Abdomen: Soft nontender normal bowel sounds present.   . Musculoskeletal: No lower extremity edema. Psychiatry: Mood is appropriate for condition and setting.  Data Reviewed: CBC: Recent Labs  Lab 01/06/19 1349 01/07/19 0647 01/08/19 0357  WBC 6.4 8.5 11.1*  NEUTROABS  --  7.9*  10.3*  HGB 10.4* 9.1* 9.5*  HCT 32.5* 28.7* 27.9*  MCV 92.6 94.1 89.1  PLT 204 266 [id number]   Basic Metabolic Panel: Recent Labs  Lab 01/06/19 1349 01/07/19 0647 01/08/19 0357  NA 134* 138 136  K 4.4 5.1 4.3  CL 98 101 96*  CO2 18* 17* 22  GLUCOSE 287* 237* 241*  BUN 108* 124* 97*  CREATININE 11.31* 11.81* 8.82*  CALCIUM 7.7* 7.7* 7.9*  MG  --  2.3 2.2  PHOS  --  5.6* 6.0*   GFR: Estimated Creatinine Clearance: 8.1 mL/min (A) (by C-G formula based on SCr of 8.82 mg/dL (H)). Liver Function Tests: Recent Labs  Lab 01/07/19 0647 01/08/19 0357  AST 47* 51*  ALT 32 35  ALKPHOS 70 85  BILITOT 0.8 0.8  PROT 6.6 6.8  ALBUMIN 2.5* 2.5*   No results for input(s): LIPASE, AMYLASE in the last 168 hours. No results for input(s): AMMONIA in the last 168 hours. Coagulation Profile: No results for input(s): INR, PROTIME in the last 168 hours. Cardiac Enzymes: No results for input(s): CKTOTAL, CKMB, CKMBINDEX, TROPONINI in the last 168 hours. BNP (last 3 results) No results for input(s): PROBNP in the last 8760 hours. HbA1C: Recent Labs    01/06/19 2205 01/07/19 1351  HGBA1C 5.9* 6.4*   CBG: Recent Labs  Lab 01/07/19 0800 01/07/19 1241 01/07/19 1652 01/07/19 2006 01/08/19 0841  GLUCAP 215* 275* 289* 301* 214*   Lipid Profile: No results for input(s): CHOL, HDL, LDLCALC, TRIG, CHOLHDL, LDLDIRECT in the last 72 hours. Thyroid Function Tests: No results for input(s): TSH, T4TOTAL, FREET4, T3FREE, THYROIDAB in the last 72 hours. Anemia Panel: Recent Labs    01/07/19 0647 01/08/19 0357  FERRITIN 6,353* 7,018*   Urine analysis:    Component Value Date/Time   COLORURINE YELLOW 01/06/2019 1600   APPEARANCEUR CLEAR 01/06/2019 1600   LABSPEC 1.014 01/06/2019 1600   PHURINE 5.0 01/06/2019 1600   GLUCOSEU 150 (A) 01/06/2019 1600   HGBUR SMALL (A) 01/06/2019 1600   BILIRUBINUR NEGATIVE 01/06/2019 1600   KETONESUR NEGATIVE 01/06/2019 1600   PROTEINUR 100 (A) 01/06/2019  1600   NITRITE NEGATIVE 01/06/2019 1600   LEUKOCYTESUR NEGATIVE 01/06/2019 1600   Sepsis Labs: @LABRCNTIP (procalcitonin:4,lacticidven:4)  ) Recent Results (from the past 240 hour(s))  Culture, blood (routine x 2)     Status: None (Preliminary result)   Collection Time: 01/06/19  4:00 PM   Specimen: BLOOD  Result Value Ref Range Status   Specimen Description BLOOD RIGHT ANTECUBITAL  Final   Special Requests   Final    BOTTLES DRAWN AEROBIC AND ANAEROBIC Blood Culture adequate volume   Culture   Final    NO GROWTH < 24 HOURS Performed at [facility], [place] [address], [place],  [address]    Report Status PENDING  Incomplete  Culture, blood (routine x 2)     Status: None (Preliminary result)   Collection Time: 01/06/19  4:01 PM   Specimen: BLOOD RIGHT WRIST  Result Value Ref Range Status   Specimen Description BLOOD RIGHT WRIST  Final   Special Requests   Final    BOTTLES DRAWN AEROBIC AND ANAEROBIC Blood Culture adequate volume  Culture   Final    NO GROWTH < 24 HOURS Performed at [facility], [place] [address], [place], [place] [address]    Report Status PENDING  Incomplete  Urine culture     Status: None   Collection Time: 01/06/19  4:10 PM   Specimen: Urine, Clean Catch  Result Value Ref Range Status   Specimen Description URINE, CLEAN CATCH  Final   Special Requests NONE  Final   Culture   Final    NO GROWTH Performed at [facility], [place] [address] [place], [place] [address]    Report Status 01/07/2019 FINAL  Final      Studies: No results found.  Scheduled Meds: . albuterol  2 puff Inhalation Q8H  . aspirin EC  81 mg Oral Daily  . calcitRIOL  0.5 mcg Oral Q T,Th,Sa-HD  . Chlorhexidine Gluconate Cloth  6 each Topical Daily  . Chlorhexidine Gluconate Cloth  6 each Topical Q0600  . [START ON 01/09/2019] darbepoetin (ARANESP) injection - DIALYSIS  60 mcg Intravenous Q Thu-HD  . dexamethasone (DECADRON) injection  6 mg Intravenous Q24H  .  docusate sodium  100 mg Oral BID  . guaiFENesin-dextromethorphan  10 mL Oral BID  . heparin injection (subcutaneous)  5,000 Units Subcutaneous Q8H  . hydrALAZINE  25 mg Oral TID  . insulin aspart  0-15 Units Subcutaneous TID WC  . insulin aspart  0-5 Units Subcutaneous QHS  . insulin detemir  3 Units Subcutaneous BID  . ipratropium  2 puff Inhalation Q8H  . [START ON 01/09/2019] isosorbide mononitrate  30 mg Oral Daily  . [START ON 01/09/2019] metoprolol succinate  12.5 mg Oral Daily  . multivitamin  1 tablet Oral QHS  . rosuvastatin  40 mg Oral Daily  . sevelamer carbonate  2,400 mg Oral TID WC    Continuous Infusions: . remdesivir 100 mg in NS 100 mL 100 mg (01/07/19 1816)     LOS: 2 days     Kayleen Memos, MD [facility] Pager [phone]  If 7PM-7AM, please contact night-coverage [email or web address] Password [facility] 01/08/2019, 11:44 AM

## 2019-01-08 NOTE — Progress Notes (Addendum)
[facility] NEPHROLOGY PROGRESS NOTE  Assessment/ Plan: Pt is a 69 y.o. yo male with DM, HTN, CHF, AAS, ESRD on HD, missed 3 HD treatment with COVID-19 PNA.  Dialysis Orders: [facility]: [facility]  on TTS . EDW 85  HD Bath 3k, 2 ca  Time 4hr 15 min  [id number] . Access  L RC AVF   / trouble with cannulating / ( CKV APT Pend )  Using perm cath  Calcitriol 0.5   mcg po /HD  Mircera 60  ( last on 12/20/18 )  Venofer 50 q wk  #Acute COVID-19 pneumonia with hypoxia: Currently on oxygen, remdesivir and steroid.  Per primary team.  # ESRD, TTS: Status post HD yesterday with around 2 L UF, tolerated well.  Plan for next HD tomorrow. TDC and heparin with HD.  # Anemia of CKD: Continue ESA.  Discontinue iron because of infection/with high ferritin level.  Monitor hemoglobin.  # Secondary hyperparathyroidism: Continue Renvela.  Low phosphorus diet.  # HTN/volume: BP high, added hydralazine.  Receiving morning medications soon.  Monitor BP.    Subjective: Chart reviewed including lab results, medication and imaging studies.  Patient was not directly examined because of COVID-19 status in order to minimize exposure and to preserve PPE.  The physical examination of the primary team noted.  No new event. Objective Vital signs in last 24 hours: Vitals:   01/07/19 2330 01/08/19 0000 01/08/19 0539 01/08/19 0844  BP: (!) 129/55 (!) 125/50 (!) 166/59 (!) 178/65  Pulse: (!) 55 (!) 52 (!) 58 (!) 58  Resp:   20 (!) 32  Temp:  98 F (36.7 C) 97.9 F (36.6 C) 98.4 F (36.9 C)  TempSrc:  Oral Oral Oral  SpO2:   93% 92%  Weight:  82 kg     Weight change:   Intake/Output Summary (Last 24 hours) at 01/08/2019 0851 Last data filed at 01/07/2019 2345 Gross per 24 hour  Intake --  Output 2400 ml  Net -2400 ml       Labs: Basic Metabolic Panel: Recent Labs  Lab 01/06/19 1349 01/07/19 0647 01/08/19 0357  NA 134* 138 136  K 4.4 5.1 4.3  CL 98 101 96*  CO2 18* 17* 22  GLUCOSE 287* 237*  241*  BUN 108* 124* 97*  CREATININE 11.31* 11.81* 8.82*  CALCIUM 7.7* 7.7* 7.9*  PHOS  --  5.6* 6.0*   Liver Function Tests: Recent Labs  Lab 01/07/19 0647 01/08/19 0357  AST 47* 51*  ALT 32 35  ALKPHOS 70 85  BILITOT 0.8 0.8  PROT 6.6 6.8  ALBUMIN 2.5* 2.5*   No results for input(s): LIPASE, AMYLASE in the last 168 hours. No results for input(s): AMMONIA in the last 168 hours. CBC: Recent Labs  Lab 01/06/19 1349 01/07/19 0647 01/08/19 0357  WBC 6.4 8.5 11.1*  NEUTROABS  --  7.9* 10.3*  HGB 10.4* 9.1* 9.5*  HCT 32.5* 28.7* 27.9*  MCV 92.6 94.1 89.1  PLT 204 266 346   Cardiac Enzymes: No results for input(s): CKTOTAL, CKMB, CKMBINDEX, TROPONINI in the last 168 hours. CBG: Recent Labs  Lab 01/07/19 0800 01/07/19 1241 01/07/19 1652 01/07/19 2006 01/08/19 0841  GLUCAP 215* 275* 289* 301* 214*    Iron Studies:  Recent Labs    01/08/19 0357  FERRITIN 7,018*   Studies/Results: DG Chest Portable 1 View  Result Date: 01/06/2019 CLINICAL DATA:  Short of breath. EXAM: PORTABLE CHEST 1 VIEW COMPARISON:  04/24/2018 FINDINGS: A new right  jugular catheter terminates over the superior cavoatrial junction/high right atrium. The cardiac silhouette is moderately enlarged. There is mild central pulmonary vascular congestion without overt edema. No confluent airspace opacity, sizeable pleural effusion, or pneumothorax is identified. No acute osseous abnormality is seen. IMPRESSION: Cardiomegaly with mild pulmonary vascular congestion. Electronically Signed   By: Logan Bores M.D.   On: 01/06/2019 14:29    Medications: Infusions: . remdesivir 100 mg in NS 100 mL 100 mg (01/07/19 1816)    Scheduled Medications: . albuterol  2 puff Inhalation Q8H  . aspirin EC  81 mg Oral Daily  . calcitRIOL  0.5 mcg Oral Q T,Th,Sa-HD  . Chlorhexidine Gluconate Cloth  6 each Topical Daily  . [START ON 01/09/2019] darbepoetin (ARANESP) injection - DIALYSIS  60 mcg Intravenous Q Thu-HD  .  dexamethasone (DECADRON) injection  6 mg Intravenous Q24H  . docusate sodium  100 mg Oral BID  . guaiFENesin-dextromethorphan  10 mL Oral BID  . heparin injection (subcutaneous)  5,000 Units Subcutaneous Q8H  . hydrALAZINE  25 mg Oral TID  . insulin aspart  0-15 Units Subcutaneous TID WC  . insulin aspart  0-5 Units Subcutaneous QHS  . insulin detemir  3 Units Subcutaneous BID  . ipratropium  2 puff Inhalation Q8H  . [START ON 01/09/2019] isosorbide mononitrate  30 mg Oral Daily  . [START ON 01/09/2019] metoprolol succinate  12.5 mg Oral Daily  . rosuvastatin  40 mg Oral Daily  . sevelamer carbonate  2,400 mg Oral TID WC    have reviewed scheduled and prn medications.  Shiza Thelen Prasad Kegan Shepardson 01/08/2019,8:51 AM  LOS: 2 days  Pager: ID:[phone]

## 2019-01-08 NOTE — Progress Notes (Signed)
Received call from tele monitoring, patient desat to 83-87% on 5L. Turned up to 6L with no improvement. Page Dr [place] Crane and instructed to adm atrovent now and restart abluterol. Will continue to reassess patient. Patient is in no acute distress.

## 2019-01-08 NOTE — Progress Notes (Signed)
Pharmacy Antibiotic Note  Justin [name] is a 69 y.o. male with PMH of HD, TTS, IDDM, HTN, HLD, presented with increasing short of breath for 1 week that was associated with generalized weakness and productive cough admitted on 01/06/2019 with COVID19 pneumonia. There is concern for a superimposed bacterial infection  Pharmacy has been consulted for vancomycin and zosyn dosing.  Patient's oxygen requirements continue to increase up to 10 L. BNP is significantly elevated at >2300. PCT is elevated at 1.53, however this lab can be falsely elevated in ESRD. Patient's CBGs are elevated in the 200s. WBC is wnls but increased from 8.5 to 11.1. Justin [name] is afebrile.  Patient is scheduled for HD on 1/7 and is on normal TTS schedule.   Plan: Vancomycin 1500 mg IV x 1 Vancomycin 750 mg IV TuThuSat w/HD  - Giving lower dose due to anticipation of only receiving 2.5-3 hours of HD from current busy schedule.  Zosyn 2.25 g IV every 8 hours  Weight: 180 lb 12.4 oz (82 kg)  Temp (24hrs), Avg:98 F (36.7 C), Min:97.6 F (36.4 C), Max:98.4 F (36.9 C)  Recent Labs  Lab 01/06/19 1349 01/07/19 0647 01/08/19 0357  WBC 6.4 8.5 11.1*  CREATININE 11.31* 11.81* 8.82*    Estimated Creatinine Clearance: 8.1 mL/min (A) (by C-G formula based on SCr of 8.82 mg/dL (H)).    No Known Allergies  Antimicrobials this admission: Vancomycin 1/6 >>  Zosyn 1/6 >>   Remdesivir 1/4 >>  Microbiology results: 1/4 BCx:NG < 24 hrs 1/4 UCx: No growh COVID +   Thank you for allowing pharmacy to be a part of this patient's care.  Justin [name], PharmD PGY1 Acute Care Pharmacy Resident 01/08/2019 12:18 PM

## 2019-01-09 LAB — CBC WITH DIFFERENTIAL/PLATELET
Abs Immature Granulocytes: 0 10*3/uL (ref 0.00–0.07)
Basophils Absolute: 0 10*3/uL (ref 0.0–0.1)
Basophils Relative: 0 %
Eosinophils Absolute: 0 10*3/uL (ref 0.0–0.5)
Eosinophils Relative: 0 %
HCT: 27 % — ABNORMAL LOW (ref 39.0–52.0)
Hemoglobin: 8.9 g/dL — ABNORMAL LOW (ref 13.0–17.0)
Lymphocytes Relative: 4 %
Lymphs Abs: 0.5 10*3/uL — ABNORMAL LOW (ref 0.7–4.0)
MCH: 29.7 pg (ref 26.0–34.0)
MCHC: 33 g/dL (ref 30.0–36.0)
MCV: 90 fL (ref 80.0–100.0)
Monocytes Absolute: 0.5 10*3/uL (ref 0.1–1.0)
Monocytes Relative: 4 %
Neutro Abs: 10.9 10*3/uL — ABNORMAL HIGH (ref 1.7–7.7)
Neutrophils Relative %: 92 %
Platelets: 348 10*3/uL (ref 150–400)
RBC: 3 MIL/uL — ABNORMAL LOW (ref 4.22–5.81)
RDW: 15.2 % (ref 11.5–15.5)
WBC: 11.8 10*3/uL — ABNORMAL HIGH (ref 4.0–10.5)
nRBC: 0 % (ref 0.0–0.2)
nRBC: 0 /100 WBC

## 2019-01-09 LAB — PHOSPHORUS: Phosphorus: 7.6 mg/dL — ABNORMAL HIGH (ref 2.5–4.6)

## 2019-01-09 LAB — FERRITIN: Ferritin: 3854 ng/mL — ABNORMAL HIGH (ref 24–336)

## 2019-01-09 LAB — COMPREHENSIVE METABOLIC PANEL
ALT: 31 U/L (ref 0–44)
AST: 37 U/L (ref 15–41)
Albumin: 2.4 g/dL — ABNORMAL LOW (ref 3.5–5.0)
Alkaline Phosphatase: 102 U/L (ref 38–126)
Anion gap: 20 — ABNORMAL HIGH (ref 5–15)
BUN: 135 mg/dL — ABNORMAL HIGH (ref 8–23)
CO2: 18 mmol/L — ABNORMAL LOW (ref 22–32)
Calcium: 7.6 mg/dL — ABNORMAL LOW (ref 8.9–10.3)
Chloride: 94 mmol/L — ABNORMAL LOW (ref 98–111)
Creatinine, Ser: 10.02 mg/dL — ABNORMAL HIGH (ref 0.61–1.24)
GFR calc Af Amer: 6 mL/min — ABNORMAL LOW (ref >60)
GFR calc non Af Amer: 5 mL/min — ABNORMAL LOW (ref >60)
Glucose, Bld: 380 mg/dL — ABNORMAL HIGH (ref 70–99)
Potassium: 4.8 mmol/L (ref 3.5–5.1)
Sodium: 132 mmol/L — ABNORMAL LOW (ref 135–145)
Total Bilirubin: 0.7 mg/dL (ref 0.3–1.2)
Total Protein: 6.3 g/dL — ABNORMAL LOW (ref 6.5–8.1)

## 2019-01-09 LAB — GLUCOSE, CAPILLARY
Glucose-Capillary: 334 mg/dL — ABNORMAL HIGH (ref 70–99)
Glucose-Capillary: 177 mg/dL — ABNORMAL HIGH (ref 70–99)
Glucose-Capillary: 218 mg/dL — ABNORMAL HIGH (ref 70–99)

## 2019-01-09 LAB — C-REACTIVE PROTEIN: CRP: 12.3 mg/dL — ABNORMAL HIGH (ref <1.0)

## 2019-01-09 LAB — D-DIMER, QUANTITATIVE: D-Dimer, Quant: 3.44 ug/mL-FEU — ABNORMAL HIGH (ref 0.00–0.50)

## 2019-01-09 LAB — MAGNESIUM: Magnesium: 2.3 mg/dL (ref 1.7–2.4)

## 2019-01-09 MED ORDER — METOPROLOL SUCCINATE ER 25 MG PO TB24
12.5000 mg | ORAL_TABLET | Freq: Every day | ORAL | Status: DC
  Administered 2019-01-10 – 2019-01-12 (×3): 12.5 mg via ORAL
  Filled 2019-01-09 (×3): qty 1

## 2019-01-09 MED ORDER — VANCOMYCIN HCL IN DEXTROSE 750-5 MG/150ML-% IV SOLN
INTRAVENOUS | Status: AC
  Filled 2019-01-09: qty 150

## 2019-01-09 MED ORDER — CHLORPROMAZINE HCL 10 MG PO TABS
10.0000 mg | ORAL_TABLET | Freq: Two times a day (BID) | ORAL | Status: DC | PRN
  Administered 2019-01-10 – 2019-01-11 (×2): 10 mg via ORAL
  Filled 2019-01-09 (×4): qty 1

## 2019-01-09 MED ORDER — DARBEPOETIN ALFA 60 MCG/0.3ML IJ SOSY
PREFILLED_SYRINGE | INTRAMUSCULAR | Status: AC
  Filled 2019-01-09: qty 0.3

## 2019-01-09 MED ORDER — INSULIN DETEMIR 100 UNIT/ML ~~LOC~~ SOLN
6.0000 [IU] | Freq: Two times a day (BID) | SUBCUTANEOUS | Status: DC
  Administered 2019-01-09: 6 [IU] via SUBCUTANEOUS
  Filled 2019-01-09 (×3): qty 0.06

## 2019-01-09 MED ORDER — INSULIN ASPART 100 UNIT/ML ~~LOC~~ SOLN
4.0000 [IU] | Freq: Three times a day (TID) | SUBCUTANEOUS | Status: DC
  Administered 2019-01-10 – 2019-01-12 (×7): 4 [IU] via SUBCUTANEOUS

## 2019-01-09 MED ORDER — SEVELAMER CARBONATE 800 MG PO TABS
2400.0000 mg | ORAL_TABLET | Freq: Three times a day (TID) | ORAL | Status: DC
  Administered 2019-01-10 – 2019-01-13 (×8): 2400 mg via ORAL
  Filled 2019-01-09 (×11): qty 3

## 2019-01-09 MED ORDER — ALTEPLASE 2 MG IJ SOLR
2.0000 mg | Freq: Once | INTRAMUSCULAR | Status: AC
  Administered 2019-01-09: 2 mg
  Filled 2019-01-09: qty 2

## 2019-01-09 MED ORDER — ALTEPLASE 2 MG IJ SOLR
2.0000 mg | Freq: Once | INTRAMUSCULAR | Status: AC
  Administered 2019-01-09: 2 mg
  Filled 2019-01-09 (×3): qty 2

## 2019-01-09 MED ORDER — ISOSORBIDE MONONITRATE ER 30 MG PO TB24
30.0000 mg | ORAL_TABLET | Freq: Every day | ORAL | Status: DC
  Administered 2019-01-10 – 2019-01-12 (×3): 30 mg via ORAL
  Filled 2019-01-09 (×3): qty 1

## 2019-01-09 NOTE — Progress Notes (Signed)
[facility] NEPHROLOGY PROGRESS NOTE  Assessment/ Plan: Pt is a 69 y.o. yo male with DM, HTN, CHF, AAS, ESRD on HD, missed 3 HD treatment with COVID-19 PNA.  Dialysis Orders: [facility]: [facility]  on TTS . EDW 85  HD Bath 3k, 2 ca  Time 4hr 15 min  [id number] . Access  L RC AVF   / trouble with cannulating / ( CKV APT Pend )  Using perm cath  Calcitriol 0.5   mcg po /HD  Mircera 60  ( last on 12/20/18 )  Venofer 50 q wk  #Acute COVID-19 pneumonia with hypoxia: supplemental oxygen, remdesivir and decadron per primary team.  # Acute hypoxic resp failure  - 2/2 covid - optimize volume status  - on supplemental oxygen  # ESRD - HD per TTS schedule.  TDC and heparin with HD. Goal 3-3.5 kg as tolerated today - discussed with HD RN.  Needs 3 hour treatment at minimum  # Anemia of CKD: Continue ESA.  IV iron off.    # Secondary hyperparathyroidism: Continue Renvela.  Low phosphorus diet.  # HTN/volume: optimize volume with HD. Noted addition of hydralazine - d/c for now to push UF with his oxygen requirement      Subjective:  Personally examined with PPE on 1/7. Last HD on 1/5 with 2 kg UF.  He has been on 6 liter per charting and is on 10 liters per arrival to room.  Clarifies that he's not normally on oxygen. States they have been using tunneled catheter as AVF not working  Review of systems:  Shortness of breath with exertion Has had hiccups No n/v No cp   Objective Vital signs in last 24 hours: Vitals:   01/08/19 0539 01/08/19 0844 01/08/19 1735 01/08/19 2105  BP: (!) 166/59 (!) 178/65 (!) 169/56 (!) 179/67  Pulse: (!) 58 (!) 58 (!) 58 (!) 59  Resp: 20 20 (!) 22 20  Temp: 97.9 F (36.6 C) 98.4 F (36.9 C) 97.7 F (36.5 C) 98 F (36.7 C)  TempSrc: Oral Oral Oral Oral  SpO2: 93% 92% 98% 98%  Weight:       Weight change:   Intake/Output Summary (Last 24 hours) at 01/09/2019 0648 Last data filed at 01/09/2019 0606 Gross per 24 hour  Intake --  Output 975 ml   Net -975 ml    Physical exam:  General adult male in bed in no acute distress at rest HEENT normocephalic atraumatic extraocular movements intact sclera anicteric Neck supple trachea midline Lungs clear but diminished on 10 liters oxygen   Heart S1 S2 no rub Abdomen soft nontender nondistended Extremities no pitting edema  Psych normal mood and affect Access :LUE AVF with bruit and thrill; RIJ tunneled catheter   Labs: Basic Metabolic Panel: Recent Labs  Lab 01/06/19 1349 01/07/19 0647 01/08/19 0357  NA 134* 138 136  K 4.4 5.1 4.3  CL 98 101 96*  CO2 18* 17* 22  GLUCOSE 287* 237* 241*  BUN 108* 124* 97*  CREATININE 11.31* 11.81* 8.82*  CALCIUM 7.7* 7.7* 7.9*  PHOS  --  5.6* 6.0*   Liver Function Tests: Recent Labs  Lab 01/07/19 0647 01/08/19 0357  AST 47* 51*  ALT 32 35  ALKPHOS 70 85  BILITOT 0.8 0.8  PROT 6.6 6.8  ALBUMIN 2.5* 2.5*   CBC: Recent Labs  Lab 01/06/19 1349 01/07/19 0647 01/08/19 0357 01/09/19 0507  WBC 6.4 8.5 11.1* 11.8*  NEUTROABS  --  7.9* 10.3* 10.9*  HGB 10.4* 9.1* 9.5* 8.9*  HCT 32.5* 28.7* 27.9* 27.0*  MCV 92.6 94.1 89.1 90.0  PLT 204 266 346 348   Cardiac Enzymes: No results for input(s): CKTOTAL, CKMB, CKMBINDEX, TROPONINI in the last 168 hours. CBG: Recent Labs  Lab 01/07/19 2006 01/08/19 0841 01/08/19 1300 01/08/19 1733 01/08/19 2058  GLUCAP 301* 214* 273* 265* 335*    Iron Studies:  Recent Labs    01/08/19 0357  FERRITIN 7,018*   Studies/Results: No results found.  Medications: Infusions: . piperacillin-tazobactam (ZOSYN)  IV 2.25 g (01/09/19 0144)  . remdesivir 100 mg in NS 100 mL 100 mg (01/08/19 1749)  . vancomycin    . vancomycin      Scheduled Medications: . albuterol  2 puff Inhalation Q8H  . aspirin EC  81 mg Oral Daily  . calcitRIOL  0.5 mcg Oral Q T,Th,Sa-HD  . Chlorhexidine Gluconate Cloth  6 each Topical Daily  . Chlorhexidine Gluconate Cloth  6 each Topical Q0600  . darbepoetin  (ARANESP) injection - DIALYSIS  60 mcg Intravenous Q Thu-HD  . dexamethasone (DECADRON) injection  6 mg Intravenous Q24H  . docusate sodium  100 mg Oral BID  . guaiFENesin-dextromethorphan  10 mL Oral BID  . heparin injection (subcutaneous)  5,000 Units Subcutaneous Q8H  . hydrALAZINE  25 mg Oral TID  . insulin aspart  0-15 Units Subcutaneous TID WC  . insulin aspart  0-5 Units Subcutaneous QHS  . insulin aspart  2 Units Subcutaneous TID WC  . insulin detemir  4 Units Subcutaneous BID  . ipratropium  2 puff Inhalation Q8H  . isosorbide mononitrate  30 mg Oral Daily  . metoprolol succinate  12.5 mg Oral Daily  . multivitamin  1 tablet Oral QHS  . rosuvastatin  40 mg Oral Daily  . sevelamer carbonate  2,400 mg Oral TID WC    have reviewed scheduled and prn medications.   Marlane Hatcher Keajah Killough 01/09/2019,6:48 AM  LOS: 3 days

## 2019-01-09 NOTE — Progress Notes (Signed)
Inpatient Diabetes Program Recommendations  AACE/ADA: New Consensus Statement on Inpatient Glycemic Control (2015)  Target Ranges:  Prepandial:   less than 140 mg/dL      Peak postprandial:   less than 180 mg/dL (1-2 hours)      Critically ill patients:  140 - 180 mg/dL   Lab Results  Component Value Date   GLUCAP 334 (H) 01/09/2019   HGBA1C 6.4 (H) 01/07/2019    Review of Glycemic Control Results for Justin [name], Justin [name] (MRN [id number]) as of 01/09/2019 11:43  Ref. Range 01/08/2019 08:41 01/08/2019 13:00 01/08/2019 17:33 01/08/2019 20:58 01/09/2019 07:02  Glucose-Capillary Latest Ref Range: 70 - 99 mg/dL 214 (H) 273 (H) 265 (H) 335 (H) 334 (H)    Inpatient Diabetes Program Recommendations:   -Increase Levemir to 6 units bid.  Thank you, Nani Gasser. Zhara Gieske, RN, MSN, CDE  Diabetes Coordinator Inpatient Glycemic Control Team Team Pager [phone] (8am-5pm) 01/09/2019 11:43 AM

## 2019-01-09 NOTE — Progress Notes (Signed)
PT Cancellation Note  Patient Details Name: Justin [name] MRN: [id number] DOB: 1950/04/30   Cancelled Treatment:    Reason Eval/Treat Not Completed: Patient at procedure or test/unavailable   Patient out of room. Will re-attempt as pt available and schedule permits.   Arby Barrette, PT Pager [phone]    Rexanne Mano 01/09/2019, 12:10 PM

## 2019-01-09 NOTE — Progress Notes (Signed)
PROGRESS NOTE  Justin [name] [id number] DOB: August 13, 1950 DOA: 01/06/2019 PCP: Reynold Bowen, MD  HPI/Recap of past 24 hours: Justin [name] is a 69 y.o. male with medical history significant of ESRD on HD, TTS, IDDM, HTN, HLD, presented with increasing short of breath for 1 week.   Associated with generalized weakness and productive cough.  O2 saturation 83% on room air in the ED.  Chest x-ray showed multifocal infiltrates.  COVID-19 screening test positive on 01/06/2019.  Started on COVID-19 directed therapies.    01/09/19: Seen and examined.  States his cough is better.  Reports intermittent hiccups.   Assessment/Plan: Active Problems:   COVID-19 virus infection   COVID-19   Acute COVID-19 viral pneumonia superimposed by HCAP, POA Presented with shortness of breath, hypoxia with positive COVID-19 in-house test on 01/06/2019. Continue remdesivir day number 4 out of 5 Continue IV Decadron 6 mg daily x10 days.  Initial procalcitonin elevated 1.53  BNP also significantly elevated greater than 2300 Volume status addressed by hemodialysis. Started IV Vancomycin and IV Zosyn on 01/08/2019. DC IV vancomycin on 01/09/2019 with negative MRSA screening. Continue bronchodilators albuterol inhaler 2 puffs 3 times daily and ipratropium inhaler 2 puffs 3 times daily Continue Robitussin syrup twice daily x3 days Continue to maintain O2 saturation greater than 92% Continue incentive spirometer and flutter valve Inflammatory markers are trending down, continue to trend. Reviewed chest x-ray done on admission which showed bilateral patchy pulmonary infiltrates and increase in pulmonary vascularity with cardiomegaly.  Acute hypoxic respiratory failure secondary to acute COVID-19 viral pneumonia Not on oxygen supplementation at baseline Rest of management as stated above.  ESRD on HD TTS Hemodialysis planned today Nephrology following  Acute on chronic systolic CHF Last 2D echo done on 04/25/2018  showed LVEF 35 to 40% with inferior septal hypokinesis Chest x-ray admission showing cardiomegaly with increase in pulmonary vascularity. Elevated BNP greater than 2300. Volume status addressed by hemodialysis Plan for next HD today.  Intermittent hiccups Treat symptomatically  Essential hypertension Continue home medications Continue to monitor vital signs Avoid hypotension  Insulin-dependent type 2 diabetes Hemoglobin A1c 6.4 on 01/07/2019. Continue insulin coverage Avoid hypoglycemia  DVT prophylaxis: Subcu heparin 3 times daily Code Status: Full code Family Communication: None at bedside  Disposition Plan:  Will plan for discharge once respiratory status is close to baseline. Consults called: Nephrology     Objective: Vitals:   01/09/19 1030 01/09/19 1100 01/09/19 1130 01/09/19 1200  BP: (!) 147/75 126/65 133/66 119/62  Pulse: (!) 58 (!) 55 (!) 58 (!) 59  Resp:      Temp:      TempSrc:      SpO2:      Weight:        Intake/Output Summary (Last 24 hours) at 01/09/2019 1317 Last data filed at 01/09/2019 0606 Gross per 24 hour  Intake -  Output 825 ml  Net -825 ml   Filed Weights   01/07/19 2100 01/08/19 0000  Weight: 84 kg 82 kg    Exam:  . General: 69 y.o. year-old male pleasant well-developed well-nourished no acute distress.  Alert and oriented x3.   . Cardiovascular: Regular rate and rhythm no rubs or gallops.   [name] Kitchen Respiratory: Diffuse rales bilaterally no wheezing noted.   . Abdomen: Soft nontender nondistended normal bowel sounds present. . Musculoskeletal: No lower extremity edema bilaterally. Psychiatry: Mood is appropriate for condition and setting.   Data Reviewed: CBC: Recent Labs  Lab 01/06/19 1349 01/07/19 [id number]  01/08/19 0357 01/09/19 0507  WBC 6.4 8.5 11.1* 11.8*  NEUTROABS  --  7.9* 10.3* 10.9*  HGB 10.4* 9.1* 9.5* 8.9*  HCT 32.5* 28.7* 27.9* 27.0*  MCV 92.6 94.1 89.1 90.0  PLT 204 266 346 0000000   Basic Metabolic Panel: Recent Labs   Lab 01/06/19 1349 01/07/19 0647 01/08/19 0357 01/09/19 0507  NA 134* 138 136 132*  K 4.4 5.1 4.3 4.8  CL 98 101 96* 94*  CO2 18* 17* 22 18*  GLUCOSE 287* 237* 241* 380*  BUN 108* 124* 97* 135*  CREATININE 11.31* 11.81* 8.82* 10.02*  CALCIUM 7.7* 7.7* 7.9* 7.6*  MG  --  2.3 2.2 2.3  PHOS  --  5.6* 6.0* 7.6*   GFR: Estimated Creatinine Clearance: 7.2 mL/min (A) (by C-G formula based on SCr of 10.02 mg/dL (H)). Liver Function Tests: Recent Labs  Lab 01/07/19 0647 01/08/19 0357 01/09/19 0507  AST 47* 51* 37  ALT 32 35 31  ALKPHOS 70 85 102  BILITOT 0.8 0.8 0.7  PROT 6.6 6.8 6.3*  ALBUMIN 2.5* 2.5* 2.4*   No results for input(s): LIPASE, AMYLASE in the last 168 hours. No results for input(s): AMMONIA in the last 168 hours. Coagulation Profile: No results for input(s): INR, PROTIME in the last 168 hours. Cardiac Enzymes: No results for input(s): CKTOTAL, CKMB, CKMBINDEX, TROPONINI in the last 168 hours. BNP (last 3 results) No results for input(s): PROBNP in the last 8760 hours. HbA1C: Recent Labs    01/06/19 2205 01/07/19 1351  HGBA1C 5.9* 6.4*   CBG: Recent Labs  Lab 01/08/19 0841 01/08/19 1300 01/08/19 1733 01/08/19 2058 01/09/19 0702  GLUCAP 214* 273* 265* 335* 334*   Lipid Profile: No results for input(s): CHOL, HDL, LDLCALC, TRIG, CHOLHDL, LDLDIRECT in the last 72 hours. Thyroid Function Tests: No results for input(s): TSH, T4TOTAL, FREET4, T3FREE, THYROIDAB in the last 72 hours. Anemia Panel: Recent Labs    01/08/19 0357 01/09/19 0507  FERRITIN 7,018* 3,854*   Urine analysis:    Component Value Date/Time   COLORURINE YELLOW 01/06/2019 1600   APPEARANCEUR CLEAR 01/06/2019 1600   LABSPEC 1.014 01/06/2019 1600   PHURINE 5.0 01/06/2019 1600   GLUCOSEU 150 (A) 01/06/2019 1600   HGBUR SMALL (A) 01/06/2019 1600   BILIRUBINUR NEGATIVE 01/06/2019 1600   KETONESUR NEGATIVE 01/06/2019 1600   PROTEINUR 100 (A) 01/06/2019 1600   NITRITE NEGATIVE  01/06/2019 1600   LEUKOCYTESUR NEGATIVE 01/06/2019 1600   Sepsis Labs: @LABRCNTIP (procalcitonin:4,lacticidven:4)  ) Recent Results (from the past 240 hour(s))  Culture, blood (routine x 2)     Status: None (Preliminary result)   Collection Time: 01/06/19  4:00 PM   Specimen: BLOOD  Result Value Ref Range Status   Specimen Description BLOOD RIGHT ANTECUBITAL  Final   Special Requests   Final    BOTTLES DRAWN AEROBIC AND ANAEROBIC Blood Culture adequate volume   Culture   Final    NO GROWTH 3 DAYS Performed at [facility], [place] [address], [place], [place] [address]    Report Status PENDING  Incomplete  Culture, blood (routine x 2)     Status: None (Preliminary result)   Collection Time: 01/06/19  4:01 PM   Specimen: BLOOD RIGHT WRIST  Result Value Ref Range Status   Specimen Description BLOOD RIGHT WRIST  Final   Special Requests   Final    BOTTLES DRAWN AEROBIC AND ANAEROBIC Blood Culture adequate volume   Culture   Final    NO  GROWTH 3 DAYS Performed at [facility], [place] [address], [place], [place] [address]    Report Status PENDING  Incomplete  Urine culture     Status: None   Collection Time: 01/06/19  4:10 PM   Specimen: Urine, Clean Catch  Result Value Ref Range Status   Specimen Description URINE, CLEAN CATCH  Final   Special Requests NONE  Final   Culture   Final    NO GROWTH Performed at [facility], [place] [address] [place], [place] [address]    Report Status 01/07/2019 FINAL  Final  MRSA PCR Screening     Status: None   Collection Time: 01/08/19  5:53 PM   Specimen: Nasal Mucosa; Nasopharyngeal  Result Value Ref Range Status   MRSA by PCR NEGATIVE NEGATIVE Final    Comment:        The GeneXpert MRSA Assay (FDA approved for NASAL specimens only), is one component of a comprehensive MRSA colonization surveillance program. It is not intended to diagnose MRSA infection nor to guide or monitor treatment for MRSA infections. Performed  at [facility], [place] [address], [place], [place] [address]       Studies: No results found.  Scheduled Meds: . albuterol  2 puff Inhalation Q8H  . alteplase  2 mg Intracatheter Once  . alteplase  2 mg Intracatheter Once  . aspirin EC  81 mg Oral Daily  . calcitRIOL  0.5 mcg Oral Q T,Th,Sa-HD  . Chlorhexidine Gluconate Cloth  6 each Topical Daily  . Chlorhexidine Gluconate Cloth  6 each Topical Q0600  . Darbepoetin Alfa      . darbepoetin (ARANESP) injection - DIALYSIS  60 mcg Intravenous Q Thu-HD  . dexamethasone (DECADRON) injection  6 mg Intravenous Q24H  . docusate sodium  100 mg Oral BID  . guaiFENesin-dextromethorphan  10 mL Oral BID  . heparin injection (subcutaneous)  5,000 Units Subcutaneous Q8H  . insulin aspart  0-15 Units Subcutaneous TID WC  . insulin aspart  0-5 Units Subcutaneous QHS  . insulin aspart  4 Units Subcutaneous TID WC  . insulin detemir  6 Units Subcutaneous BID  . ipratropium  2 puff Inhalation Q8H  . [START ON 01/10/2019] isosorbide mononitrate  30 mg Oral QHS  . [START ON 01/10/2019] metoprolol succinate  12.5 mg Oral QHS  . multivitamin  1 tablet Oral QHS  . rosuvastatin  40 mg Oral Daily  . sevelamer carbonate  2,400 mg Oral TID WC    Continuous Infusions: . piperacillin-tazobactam (ZOSYN)  IV 2.25 g (01/09/19 0144)  . remdesivir 100 mg in NS 100 mL 100 mg (01/08/19 1749)     LOS: 3 days     Kayleen Memos, MD [facility] Pager [phone]  If 7PM-7AM, please contact night-coverage [email or web address] Password [email or web address] 01/09/2019, 1:17 PM

## 2019-01-10 ENCOUNTER — Inpatient Hospital Stay (HOSPITAL_COMMUNITY): Payer: Medicare HMO

## 2019-01-10 DIAGNOSIS — U071 COVID-19: Secondary | ICD-10-CM

## 2019-01-10 DIAGNOSIS — I2699 Other pulmonary embolism without acute cor pulmonale: Secondary | ICD-10-CM

## 2019-01-10 LAB — COMPREHENSIVE METABOLIC PANEL
ALT: 29 U/L (ref 0–44)
AST: 33 U/L (ref 15–41)
Albumin: 2.5 g/dL — ABNORMAL LOW (ref 3.5–5.0)
Alkaline Phosphatase: 101 U/L (ref 38–126)
Anion gap: 21 — ABNORMAL HIGH (ref 5–15)
BUN: 111 mg/dL — ABNORMAL HIGH (ref 8–23)
CO2: 19 mmol/L — ABNORMAL LOW (ref 22–32)
Calcium: 7.5 mg/dL — ABNORMAL LOW (ref 8.9–10.3)
Chloride: 92 mmol/L — ABNORMAL LOW (ref 98–111)
Creatinine, Ser: 8.11 mg/dL — ABNORMAL HIGH (ref 0.61–1.24)
GFR calc Af Amer: 7 mL/min — ABNORMAL LOW (ref >60)
GFR calc non Af Amer: 6 mL/min — ABNORMAL LOW (ref >60)
Glucose, Bld: 222 mg/dL — ABNORMAL HIGH (ref 70–99)
Potassium: 4.5 mmol/L (ref 3.5–5.1)
Sodium: 132 mmol/L — ABNORMAL LOW (ref 135–145)
Total Bilirubin: 0.7 mg/dL (ref 0.3–1.2)
Total Protein: 6.6 g/dL (ref 6.5–8.1)

## 2019-01-10 LAB — GLUCOSE, CAPILLARY
Glucose-Capillary: 206 mg/dL — ABNORMAL HIGH (ref 70–99)
Glucose-Capillary: 156 mg/dL — ABNORMAL HIGH (ref 70–99)
Glucose-Capillary: 169 mg/dL — ABNORMAL HIGH (ref 70–99)
Glucose-Capillary: 269 mg/dL — ABNORMAL HIGH (ref 70–99)

## 2019-01-10 LAB — FERRITIN: Ferritin: 3024 ng/mL — ABNORMAL HIGH (ref 24–336)

## 2019-01-10 LAB — CBC WITH DIFFERENTIAL/PLATELET
Abs Immature Granulocytes: 0.4 10*3/uL — ABNORMAL HIGH (ref 0.00–0.07)
Basophils Absolute: 0 10*3/uL (ref 0.0–0.1)
Basophils Relative: 0 %
Eosinophils Absolute: 0 10*3/uL (ref 0.0–0.5)
Eosinophils Relative: 0 %
HCT: 29.7 % — ABNORMAL LOW (ref 39.0–52.0)
Hemoglobin: 9.9 g/dL — ABNORMAL LOW (ref 13.0–17.0)
Immature Granulocytes: 4 %
Lymphocytes Relative: 4 %
Lymphs Abs: 0.4 10*3/uL — ABNORMAL LOW (ref 0.7–4.0)
MCH: 29.6 pg (ref 26.0–34.0)
MCHC: 33.3 g/dL (ref 30.0–36.0)
MCV: 88.7 fL (ref 80.0–100.0)
Monocytes Absolute: 0.5 10*3/uL (ref 0.1–1.0)
Monocytes Relative: 5 %
Neutro Abs: 9.3 10*3/uL — ABNORMAL HIGH (ref 1.7–7.7)
Neutrophils Relative %: 87 %
Platelets: 364 10*3/uL (ref 150–400)
RBC: 3.35 MIL/uL — ABNORMAL LOW (ref 4.22–5.81)
RDW: 15.2 % (ref 11.5–15.5)
WBC: 10.6 10*3/uL — ABNORMAL HIGH (ref 4.0–10.5)
nRBC: 0 % (ref 0.0–0.2)

## 2019-01-10 LAB — D-DIMER, QUANTITATIVE: D-Dimer, Quant: 6.67 ug/mL-FEU — ABNORMAL HIGH (ref 0.00–0.50)

## 2019-01-10 LAB — MAGNESIUM: Magnesium: 2.1 mg/dL (ref 1.7–2.4)

## 2019-01-10 LAB — C-REACTIVE PROTEIN: CRP: 11.4 mg/dL — ABNORMAL HIGH (ref <1.0)

## 2019-01-10 LAB — PHOSPHORUS: Phosphorus: 7.3 mg/dL — ABNORMAL HIGH (ref 2.5–4.6)

## 2019-01-10 MED ORDER — IOHEXOL 350 MG/ML SOLN
100.0000 mL | Freq: Once | INTRAVENOUS | Status: AC | PRN
  Administered 2019-01-10: 100 mL via INTRAVENOUS

## 2019-01-10 MED ORDER — INSULIN DETEMIR 100 UNIT/ML ~~LOC~~ SOLN
8.0000 [IU] | Freq: Two times a day (BID) | SUBCUTANEOUS | Status: DC
  Administered 2019-01-10 – 2019-01-12 (×6): 8 [IU] via SUBCUTANEOUS
  Filled 2019-01-10 (×7): qty 0.08

## 2019-01-10 MED ORDER — HEPARIN SODIUM (PORCINE) 10000 UNIT/ML IJ SOLN
7500.0000 [IU] | Freq: Three times a day (TID) | INTRAMUSCULAR | Status: DC
  Administered 2019-01-10 – 2019-01-13 (×9): 7500 [IU] via SUBCUTANEOUS
  Filled 2019-01-10 (×12): qty 1

## 2019-01-10 MED ORDER — CHLORHEXIDINE GLUCONATE CLOTH 2 % EX PADS
6.0000 | MEDICATED_PAD | Freq: Every day | CUTANEOUS | Status: DC
  Administered 2019-01-11: 6 via TOPICAL

## 2019-01-10 NOTE — Progress Notes (Signed)
PT Cancellation Note  Patient Details Name: Justin [name] MRN: [id number] DOB: [birth year]   Cancelled Treatment:    Reason Eval/Treat Not Completed: Other (comment). Pt refused stating he didn't sleep last night and doesn't want to get up at this time. Explained I likely would not be able to see him later and he continued to refuse and stated he would get up later.    Shary Decamp [facility] 01/10/2019, 10:24 AM [place] Pager [phone] Office [phone]

## 2019-01-10 NOTE — Progress Notes (Signed)
PROGRESS NOTE  CHAISE [name] [id number] DOB: January 09, 1950 DOA: 01/06/2019 PCP: Reynold Bowen, MD  HPI/Recap of past 24 hours: Justin [name] is a 69 y.o. male with medical history significant of ESRD on HD, TTS, IDDM, HTN, HLD, presented with increasing short of breath for 1 week.   Associated with generalized weakness and productive cough.  O2 saturation 83% on room air in the ED.  Chest x-ray showed multifocal infiltrates.  COVID-19 screening test positive on 01/06/2019.  Started on COVID-19 directed therapies.    01/10/19: Seen and examined.  Increased O2 requirement up to 10 L high flow nasal cannula.  Denies any chest pain or dyspnea at rest.  Repeated D-dimer significantly elevated greater than 6.  Stat CTA PE ordered.  DVT prophylaxis subcu heparin dose adjusted by pharmacy.  Continue to maintain O2 saturation greater than 92%.    Assessment/Plan: Active Problems:   COVID-19 virus infection   COVID-19   Acute COVID-19 viral pneumonia superimposed by HCAP, POA Presented with shortness of breath, hypoxia with positive COVID-19 in-house test on 01/06/2019. Continue remdesivir day number 5 out of 5 Continue IV Decadron 6 mg daily x10 days.  Initial procalcitonin elevated 1.53  BNP also significantly elevated greater than 2300 Volume status addressed by hemodialysis. Started IV Vancomycin and IV Zosyn on 01/08/2019. DC IV vancomycin on 01/09/2019 with negative MRSA screening. Continue bronchodilators albuterol inhaler 2 puffs 3 times daily and ipratropium inhaler 2 puffs 3 times daily Continue Robitussin syrup twice daily x3 days Continue to maintain O2 saturation greater than 92% Continue incentive spirometer and flutter valve D-dimer significantly elevated greater than 6. Continue to trend inflammatory markers  Acute hypoxic respiratory failure secondary to acute COVID-19 viral pneumonia Not on oxygen supplementation at baseline D-dimer significantly elevated, rule out PE Stat CTA PE  ordered  ESRD on HD TTS Hemodialysis per nephrology.  Acute on chronic systolic CHF Last 2D echo done on 04/25/2018 showed LVEF 35 to 40% with inferior septal hypokinesis Chest x-ray admission showing cardiomegaly with increase in pulmonary vascularity. Elevated BNP greater than 2300. Volume status addressed by hemodialysis  Intermittent hiccups Treat symptomatically  Essential hypertension Continue home medications Continue to monitor vital signs Avoid hypotension  Insulin-dependent type 2 diabetes Hemoglobin A1c 6.4 on 01/07/2019. Continue insulin coverage, adjust as needed Avoid hypoglycemia  DVT prophylaxis: Subcu heparin 3 times daily Code Status: Full code Family Communication: None at bedside  Disposition Plan:  Will plan for discharge once respiratory status is close to baseline. Consults called: Nephrology     Objective: Vitals:   01/09/19 1627 01/09/19 2305 01/10/19 0901 01/10/19 0911  BP: (!) 113/50 (!) 157/59 (!) 155/63   Pulse: 62 64    Resp: 20  20   Temp: 97.8 F (36.6 C) 98.3 F (36.8 C) 98.2 F (36.8 C)   TempSrc: Oral Oral Oral   SpO2: 99% 95% 96% 99%  Weight:        Intake/Output Summary (Last 24 hours) at 01/10/2019 1101 Last data filed at 01/10/2019 0916 Gross per 24 hour  Intake --  Output 2911 ml  Net -2911 ml   Filed Weights   01/07/19 2100 01/08/19 0000  Weight: 84 kg 82 kg    Exam:  . General: 69 y.o. year-old male pleasant well-developed well-nourished no acute distress.  Alert and oriented x3.   . Cardiovascular: Regular rate and rhythm no rubs or gallops.  No JVD or thyromegaly noted.   [name] Kitchen Respiratory: Mild rales at bases no  wheezing noted.   . Abdomen: Soft Nontender Nondistended Normal Bowel Sounds Present. Musculoskeletal: No lower extremity edema bilaterally.   Psychiatry: Mood is appropriate for condition and setting.  Data Reviewed: CBC: Recent Labs  Lab 01/06/19 1349 01/07/19 0647 01/08/19 0357 01/09/19 0507  01/10/19 0325  WBC 6.4 8.5 11.1* 11.8* 10.6*  NEUTROABS  --  7.9* 10.3* 10.9* 9.3*  HGB 10.4* 9.1* 9.5* 8.9* 9.9*  HCT 32.5* 28.7* 27.9* 27.0* 29.7*  MCV 92.6 94.1 89.1 90.0 88.7  PLT 204 266 346 348 [id number]   Basic Metabolic Panel: Recent Labs  Lab 01/06/19 1349 01/07/19 0647 01/08/19 0357 01/09/19 0507 01/10/19 0325  NA 134* 138 136 132* 132*  K 4.4 5.1 4.3 4.8 4.5  CL 98 101 96* 94* 92*  CO2 18* 17* 22 18* 19*  GLUCOSE 287* 237* 241* 380* 222*  BUN 108* 124* 97* 135* 111*  CREATININE 11.31* 11.81* 8.82* 10.02* 8.11*  CALCIUM 7.7* 7.7* 7.9* 7.6* 7.5*  MG  --  2.3 2.2 2.3 2.1  PHOS  --  5.6* 6.0* 7.6* 7.3*   GFR: Estimated Creatinine Clearance: 8.8 mL/min (A) (by C-G formula based on SCr of 8.11 mg/dL (H)). Liver Function Tests: Recent Labs  Lab 01/07/19 0647 01/08/19 0357 01/09/19 0507 01/10/19 0325  AST 47* 51* 37 33  ALT 32 35 31 29  ALKPHOS 70 85 102 101  BILITOT 0.8 0.8 0.7 0.7  PROT 6.6 6.8 6.3* 6.6  ALBUMIN 2.5* 2.5* 2.4* 2.5*   No results for input(s): LIPASE, AMYLASE in the last 168 hours. No results for input(s): AMMONIA in the last 168 hours. Coagulation Profile: No results for input(s): INR, PROTIME in the last 168 hours. Cardiac Enzymes: No results for input(s): CKTOTAL, CKMB, CKMBINDEX, TROPONINI in the last 168 hours. BNP (last 3 results) No results for input(s): PROBNP in the last 8760 hours. HbA1C: Recent Labs    01/07/19 1351  HGBA1C 6.4*   CBG: Recent Labs  Lab 01/08/19 2058 01/09/19 0702 01/09/19 1624 01/09/19 2103 01/10/19 0912  GLUCAP 335* 334* 177* 218* 206*   Lipid Profile: No results for input(s): CHOL, HDL, LDLCALC, TRIG, CHOLHDL, LDLDIRECT in the last 72 hours. Thyroid Function Tests: No results for input(s): TSH, T4TOTAL, FREET4, T3FREE, THYROIDAB in the last 72 hours. Anemia Panel: Recent Labs    01/09/19 0507 01/10/19 0325  FERRITIN 3,854* 3,024*   Urine analysis:    Component Value Date/Time   COLORURINE  YELLOW 01/06/2019 1600   APPEARANCEUR CLEAR 01/06/2019 1600   LABSPEC 1.014 01/06/2019 1600   PHURINE 5.0 01/06/2019 1600   GLUCOSEU 150 (A) 01/06/2019 1600   HGBUR SMALL (A) 01/06/2019 1600   BILIRUBINUR NEGATIVE 01/06/2019 1600   KETONESUR NEGATIVE 01/06/2019 1600   PROTEINUR 100 (A) 01/06/2019 1600   NITRITE NEGATIVE 01/06/2019 1600   LEUKOCYTESUR NEGATIVE 01/06/2019 1600   Sepsis Labs: @LABRCNTIP (procalcitonin:4,lacticidven:4)  ) Recent Results (from the past 240 hour(s))  Culture, blood (routine x 2)     Status: None (Preliminary result)   Collection Time: 01/06/19  4:00 PM   Specimen: BLOOD  Result Value Ref Range Status   Specimen Description BLOOD RIGHT ANTECUBITAL  Final   Special Requests   Final    BOTTLES DRAWN AEROBIC AND ANAEROBIC Blood Culture adequate volume   Culture   Final    NO GROWTH 4 DAYS Performed at [facility], [place] [address] [place], [place] [address]    Report Status PENDING  Incomplete  Culture, blood (routine x 2)  Status: None (Preliminary result)   Collection Time: 01/06/19  4:01 PM   Specimen: BLOOD RIGHT WRIST  Result Value Ref Range Status   Specimen Description BLOOD RIGHT WRIST  Final   Special Requests   Final    BOTTLES DRAWN AEROBIC AND ANAEROBIC Blood Culture adequate volume   Culture   Final    NO GROWTH 4 DAYS Performed at [facility], [address], [place], [place] [address]    Report Status PENDING  Incomplete  Urine culture     Status: None   Collection Time: 01/06/19  4:10 PM   Specimen: Urine, Clean Catch  Result Value Ref Range Status   Specimen Description URINE, CLEAN CATCH  Final   Special Requests NONE  Final   Culture   Final    NO GROWTH Performed at [facility], [place] [address], [place], [place] [address]    Report Status 01/07/2019 FINAL  Final  MRSA PCR Screening     Status: None   Collection Time: 01/08/19  5:53 PM   Specimen: Nasal Mucosa; Nasopharyngeal  Result Value Ref Range  Status   MRSA by PCR NEGATIVE NEGATIVE Final    Comment:        The GeneXpert MRSA Assay (FDA approved for NASAL specimens only), is one component of a comprehensive MRSA colonization surveillance program. It is not intended to diagnose MRSA infection nor to guide or monitor treatment for MRSA infections. Performed at [facility], [place] [address], [place],  [address]       Studies: No results found.  Scheduled Meds: . albuterol  2 puff Inhalation Q8H  . aspirin EC  81 mg Oral Daily  . calcitRIOL  0.5 mcg Oral Q T,Th,Sa-HD  . Chlorhexidine Gluconate Cloth  6 each Topical Daily  . Chlorhexidine Gluconate Cloth  6 each Topical Q0600  . darbepoetin (ARANESP) injection - DIALYSIS  60 mcg Intravenous Q Thu-HD  . dexamethasone (DECADRON) injection  6 mg Intravenous Q24H  . docusate sodium  100 mg Oral BID  . heparin injection (subcutaneous)  7,500 Units Subcutaneous Q8H  . insulin aspart  0-15 Units Subcutaneous TID WC  . insulin aspart  0-5 Units Subcutaneous QHS  . insulin aspart  4 Units Subcutaneous TID WC  . insulin detemir  8 Units Subcutaneous BID  . ipratropium  2 puff Inhalation Q8H  . isosorbide mononitrate  30 mg Oral QHS  . metoprolol succinate  12.5 mg Oral QHS  . multivitamin  1 tablet Oral QHS  . rosuvastatin  40 mg Oral Daily  . sevelamer carbonate  2,400 mg Oral TID WC    Continuous Infusions: . piperacillin-tazobactam (ZOSYN)  IV 2.25 g (01/10/19 0130)  . remdesivir 100 mg in NS 100 mL 100 mg (01/09/19 1700)     LOS: 4 days     Justin Memos, MD [facility] Pager [phone]  If 7PM-7AM, please contact night-coverage [email or web address] Password [email or web address] 01/10/2019, 11:01 AM

## 2019-01-10 NOTE — Progress Notes (Signed)
[facility] NEPHROLOGY PROGRESS NOTE  Assessment/ Plan: Pt is a 69 y.o. yo male with DM, HTN, CHF, AAS, ESRD on HD, missed 3 HD treatment with COVID-19 PNA.  Dialysis Orders: Center: [facility]  on TTS . EDW 85  HD Bath 3k, 2 ca  Time 4hr 15 min  [id number] . Access  L RC AVF   / trouble with cannulating / ( CKV APT Pend )  Using perm cath  Calcitriol 0.5   mcg po /HD  Mircera 60  ( last on 12/20/18 )  Venofer 50 q wk  #Acute COVID-19 pneumonia with hypoxia: supplemental oxygen, remdesivir and decadron per primary team.  # Acute hypoxic resp failure  - felt mostly 2/2 covid - optimize volume status - cramped when attempted more UF on 1/7 - on supplemental oxygen - am concerned about his oxygen requirement as doesn't appear improved with HD  # ESRD  - HD per TTS schedule.   - TDC and heparin with HD.   - Needs 3 hour treatment - unable to shorten to 2.5 due to clearance.    # Anemia of CKD: Continue ESA.  IV iron off.    # Secondary hyperparathyroidism: Continue Renvela.  Low phosphorus diet.  # HTN/volume: optimize volume with HD.     Subjective:  Personally examined with PPE on 1/8. Last HD on 1/7 with 2.6 kg UF.  He did run for 3 hours and started cramping so wasn't able to pull more.  States that he is not short of breath at rest or with exertion thankfully but has been on 10 liter oxygen.  States never a smoker.  Review of systems:    Denies Shortness of breath Still has had hiccups No n/v No cp No home oxygen requirement   Objective Vital signs in last 24 hours: Vitals:   01/09/19 1200 01/09/19 1324 01/09/19 1627 01/09/19 2305  BP: 119/62 113/72 (!) 113/50 (!) 157/59  Pulse: (!) 59 (!) 59 62 64  Resp:  18 20   Temp:  97.7 F (36.5 C) 97.8 F (36.6 C) 98.3 F (36.8 C)  TempSrc:  Oral Oral Oral  SpO2:  100% 99% 95%  Weight:       Weight change:   Intake/Output Summary (Last 24 hours) at 01/10/2019 [id number] Last data filed at 01/10/2019 0344 Gross per  24 hour  Intake -  Output 2811 ml  Net -2811 ml    Physical exam General adult male in bed in no acute distress at rest HEENT normocephalic atraumatic extraocular movements intact sclera anicteric Neck supple trachea midline Lungs clear but diminished on 10 liters oxygen   Heart S1 S2 no rub Abdomen soft nontender nondistended Extremities he has no edema Psych normal mood and affect Access :LUE AVF with bruit and thrill; RIJ tunneled catheter   Labs: Basic Metabolic Panel: Recent Labs  Lab 01/08/19 0357 01/09/19 0507 01/10/19 0325  NA 136 132* 132*  K 4.3 4.8 4.5  CL 96* 94* 92*  CO2 22 18* 19*  GLUCOSE 241* 380* 222*  BUN 97* 135* 111*  CREATININE 8.82* 10.02* 8.11*  CALCIUM 7.9* 7.6* 7.5*  PHOS 6.0* 7.6* 7.3*   Liver Function Tests: Recent Labs  Lab 01/08/19 0357 01/09/19 0507 01/10/19 0325  AST 51* 37 33  ALT 35 31 29  ALKPHOS 85 102 101  BILITOT 0.8 0.7 0.7  PROT 6.8 6.3* 6.6  ALBUMIN 2.5* 2.4* 2.5*   CBC: Recent Labs  Lab 01/06/19 1349 01/06/19 1349  01/07/19 0647 01/08/19 0357 01/09/19 0507 01/10/19 0325  WBC 6.4  --  8.5 11.1* 11.8* 10.6*  NEUTROABS  --    < > 7.9* 10.3* 10.9* 9.3*  HGB 10.4*  --  9.1* 9.5* 8.9* 9.9*  HCT 32.5*  --  28.7* 27.9* 27.0* 29.7*  MCV 92.6  --  94.1 89.1 90.0 88.7  PLT 204  --  266 346 348 364   < > = values in this interval not displayed.   Cardiac Enzymes: No results for input(s): CKTOTAL, CKMB, CKMBINDEX, TROPONINI in the last 168 hours. CBG: Recent Labs  Lab 01/08/19 1733 01/08/19 2058 01/09/19 0702 01/09/19 1624 01/09/19 2103  GLUCAP 265* 335* 334* 177* 218*    Iron Studies:  Recent Labs    01/10/19 0325  FERRITIN 3,024*   Studies/Results: No results found.  Medications: Infusions: . piperacillin-tazobactam (ZOSYN)  IV 2.25 g (01/10/19 0130)  . remdesivir 100 mg in NS 100 mL 100 mg (01/08/19 1749)    Scheduled Medications: . albuterol  2 puff Inhalation Q8H  . aspirin EC  81 mg Oral  Daily  . calcitRIOL  0.5 mcg Oral Q T,Th,Sa-HD  . Chlorhexidine Gluconate Cloth  6 each Topical Daily  . Chlorhexidine Gluconate Cloth  6 each Topical Q0600  . darbepoetin (ARANESP) injection - DIALYSIS  60 mcg Intravenous Q Thu-HD  . dexamethasone (DECADRON) injection  6 mg Intravenous Q24H  . docusate sodium  100 mg Oral BID  . guaiFENesin-dextromethorphan  10 mL Oral BID  . heparin injection (subcutaneous)  5,000 Units Subcutaneous Q8H  . insulin aspart  0-15 Units Subcutaneous TID WC  . insulin aspart  0-5 Units Subcutaneous QHS  . insulin aspart  4 Units Subcutaneous TID WC  . insulin detemir  6 Units Subcutaneous BID  . ipratropium  2 puff Inhalation Q8H  . isosorbide mononitrate  30 mg Oral QHS  . metoprolol succinate  12.5 mg Oral QHS  . multivitamin  1 tablet Oral QHS  . rosuvastatin  40 mg Oral Daily  . sevelamer carbonate  2,400 mg Oral TID WC    have reviewed scheduled and prn medications.   Claudia Desanctis 01/10/2019,6:08 AM  LOS: 4 days

## 2019-01-11 LAB — COMPREHENSIVE METABOLIC PANEL
ALT: 23 U/L (ref 0–44)
AST: 24 U/L (ref 15–41)
Albumin: 2.2 g/dL — ABNORMAL LOW (ref 3.5–5.0)
Alkaline Phosphatase: 89 U/L (ref 38–126)
Anion gap: 20 — ABNORMAL HIGH (ref 5–15)
BUN: 148 mg/dL — ABNORMAL HIGH (ref 8–23)
CO2: 20 mmol/L — ABNORMAL LOW (ref 22–32)
Calcium: 7.6 mg/dL — ABNORMAL LOW (ref 8.9–10.3)
Chloride: 90 mmol/L — ABNORMAL LOW (ref 98–111)
Creatinine, Ser: 10.03 mg/dL — ABNORMAL HIGH (ref 0.61–1.24)
GFR calc Af Amer: 6 mL/min — ABNORMAL LOW (ref >60)
GFR calc non Af Amer: 5 mL/min — ABNORMAL LOW (ref >60)
Glucose, Bld: 346 mg/dL — ABNORMAL HIGH (ref 70–99)
Potassium: 4.9 mmol/L (ref 3.5–5.1)
Sodium: 130 mmol/L — ABNORMAL LOW (ref 135–145)
Total Bilirubin: 0.9 mg/dL (ref 0.3–1.2)
Total Protein: 6.4 g/dL — ABNORMAL LOW (ref 6.5–8.1)

## 2019-01-11 LAB — CULTURE, BLOOD (ROUTINE X 2)
Culture: NO GROWTH
Culture: NO GROWTH
Special Requests: ADEQUATE
Special Requests: ADEQUATE

## 2019-01-11 LAB — CBC WITH DIFFERENTIAL/PLATELET
Abs Immature Granulocytes: 0.78 10*3/uL — ABNORMAL HIGH (ref 0.00–0.07)
Basophils Absolute: 0 10*3/uL (ref 0.0–0.1)
Basophils Relative: 0 %
Eosinophils Absolute: 0 10*3/uL (ref 0.0–0.5)
Eosinophils Relative: 0 %
HCT: 27.7 % — ABNORMAL LOW (ref 39.0–52.0)
Hemoglobin: 9.3 g/dL — ABNORMAL LOW (ref 13.0–17.0)
Immature Granulocytes: 6 %
Lymphocytes Relative: 3 %
Lymphs Abs: 0.3 10*3/uL — ABNORMAL LOW (ref 0.7–4.0)
MCH: 29.5 pg (ref 26.0–34.0)
MCHC: 33.6 g/dL (ref 30.0–36.0)
MCV: 87.9 fL (ref 80.0–100.0)
Monocytes Absolute: 0.5 10*3/uL (ref 0.1–1.0)
Monocytes Relative: 4 %
Neutro Abs: 10.7 10*3/uL — ABNORMAL HIGH (ref 1.7–7.7)
Neutrophils Relative %: 87 %
Platelets: 362 10*3/uL (ref 150–400)
RBC: 3.15 MIL/uL — ABNORMAL LOW (ref 4.22–5.81)
RDW: 15.3 % (ref 11.5–15.5)
WBC: 12.4 10*3/uL — ABNORMAL HIGH (ref 4.0–10.5)
nRBC: 0 % (ref 0.0–0.2)

## 2019-01-11 LAB — GLUCOSE, CAPILLARY
Glucose-Capillary: 286 mg/dL — ABNORMAL HIGH (ref 70–99)
Glucose-Capillary: 131 mg/dL — ABNORMAL HIGH (ref 70–99)
Glucose-Capillary: 286 mg/dL — ABNORMAL HIGH (ref 70–99)
Glucose-Capillary: 415 mg/dL — ABNORMAL HIGH (ref 70–99)

## 2019-01-11 LAB — MAGNESIUM: Magnesium: 2.4 mg/dL (ref 1.7–2.4)

## 2019-01-11 LAB — D-DIMER, QUANTITATIVE: D-Dimer, Quant: 4.93 ug/mL-FEU — ABNORMAL HIGH (ref 0.00–0.50)

## 2019-01-11 LAB — C-REACTIVE PROTEIN: CRP: 14.2 mg/dL — ABNORMAL HIGH (ref <1.0)

## 2019-01-11 LAB — PHOSPHORUS: Phosphorus: 8.8 mg/dL — ABNORMAL HIGH (ref 2.5–4.6)

## 2019-01-11 LAB — FERRITIN: Ferritin: 2826 ng/mL — ABNORMAL HIGH (ref 24–336)

## 2019-01-11 MED ORDER — SODIUM CHLORIDE 0.9 % IV SOLN: 100.0000 mL | INTRAVENOUS | Status: DC | PRN

## 2019-01-11 MED ORDER — HEPARIN SODIUM (PORCINE) 1000 UNIT/ML DIALYSIS: 1000.0000 [IU] | INTRAMUSCULAR | Status: DC | PRN

## 2019-01-11 MED ORDER — LIDOCAINE HCL (PF) 1 % IJ SOLN: 5.0000 mL | INTRAMUSCULAR | Status: DC | PRN

## 2019-01-11 MED ORDER — LIDOCAINE-PRILOCAINE 2.5-2.5 % EX CREA: 1.0000 "application " | TOPICAL_CREAM | CUTANEOUS | Status: DC | PRN

## 2019-01-11 MED ORDER — HEPARIN SODIUM (PORCINE) 1000 UNIT/ML DIALYSIS: 20.0000 [IU]/kg | INTRAMUSCULAR | Status: DC | PRN

## 2019-01-11 MED ORDER — ALTEPLASE 2 MG IJ SOLR: 2.0000 mg | Freq: Once | INTRAMUSCULAR | Status: DC | PRN

## 2019-01-11 MED ORDER — HEPARIN SODIUM (PORCINE) 1000 UNIT/ML DIALYSIS
100.0000 [IU]/kg | INTRAMUSCULAR | Status: DC | PRN
  Filled 2019-01-11: qty 8

## 2019-01-11 MED ORDER — INSULIN ASPART 100 UNIT/ML ~~LOC~~ SOLN
8.0000 [IU] | Freq: Once | SUBCUTANEOUS | Status: AC
  Administered 2019-01-11: 8 [IU] via SUBCUTANEOUS

## 2019-01-11 MED ORDER — PENTAFLUOROPROP-TETRAFLUOROETH EX AERO: 1.0000 "application " | INHALATION_SPRAY | CUTANEOUS | Status: DC | PRN

## 2019-01-11 NOTE — Progress Notes (Signed)
Pharmacy Antibiotic Note  Justin [name] is a 69 y.o. male with PMH of HD, TTS, IDDM, HTN, HLD, presented with increasing short of breath for 1 week that was associated with generalized weakness and productive cough admitted on 01/06/2019 with COVID19 pneumonia. There is concern for a superimposed bacterial infection  Pharmacy was consulted for vancomycin and zosyn dosing 1/6. MRSA PCR negative, vancomycin discontinued 1/7 - patient continues on zosyn.   Patient's oxygen requirements has improved, 10L to 3L. Patient has been afebrile last 24 hours, however WBC is not improving, 10.6>12.4.  Patient is scheduled for HD on 1/12.  Plan: Continue zosyn 2.25 g IV every 8 hours. Monitor clinical status and establish LOT  Weight: 175 lb 11.3 oz (79.7 kg)  Temp (24hrs), Avg:97.9 F (36.6 C), Min:97.8 F (36.6 C), Max:97.9 F (36.6 C)  Recent Labs  Lab 01/07/19 0647 01/08/19 0357 01/09/19 0507 01/10/19 0325 01/11/19 0510  WBC 8.5 11.1* 11.8* 10.6* 12.4*  CREATININE 11.81* 8.82* 10.02* 8.11* 10.03*    Estimated Creatinine Clearance: 7.1 mL/min (A) (by C-G formula based on SCr of 10.03 mg/dL (H)).    No Known Allergies  Antimicrobials this admission: Vancomycin 1/6>>1/7 Zosyn 1/6 >>   Remdesivir 1/4 >>  Microbiology results: 1/4 BCx: negative 1/4 UCx: negative 1/6 MRSA: negative    Valeriano Bain L. Devin Going, [place] PGY1 Pharmacy Resident [phone] 01/11/19      11:17 AM  Please check AMION for all [place] phone numbers After 10:00 PM, call the [place] [phone]

## 2019-01-11 NOTE — Progress Notes (Signed)
PT Cancellation Note  Patient Details Name: Justin [name] MRN: [id number] DOB: September 12, 1950   Cancelled Treatment:    Reason Eval/Treat Not Completed: Patient at procedure or test/unavailable   Lashanna Angelo B Keni Wafer 01/11/2019, 9:37 AM  Bayard Males, PT Acute Rehabilitation Services Pager: [phone] Office: [phone]

## 2019-01-11 NOTE — Progress Notes (Signed)
PROGRESS NOTE  Justin [name] [id number] DOB: 1950-06-07 DOA: 01/06/2019 PCP: Reynold Bowen, MD  HPI/Recap of past 24 hours: Justin [name] is a 69 y.o. male with medical history significant of ESRD on HD, TTS, IDDM, HTN, HLD, presented with increasing short of breath for 1 week.   Associated with generalized weakness and productive cough.  O2 saturation 83% on room air in the ED.  Chest x-ray showed multifocal infiltrates.  COVID-19 screening test positive on 01/06/2019.  Started on COVID-19 directed therapies.  CTA PE negative showed bilateral ground glass opacities typical of covid-19 viral pneumonia.  01/11/19: Seen and examined.  Oxygen requirement improving.  Denies chest pain or dyspnea at rest.  No new complaints.  Hiccups have resolved.    Assessment/Plan: Active Problems:   COVID-19 virus infection   COVID-19   Acute COVID-19 viral pneumonia superimposed by HCAP, POA Presented with shortness of breath, hypoxia with positive COVID-19 in-house test on 01/06/2019. Continue remdesivir day number 5 out of 5 Continue IV Decadron 6 mg daily x10 days.  Initial procalcitonin elevated 1.53  BNP also significantly elevated greater than 2300 Volume status addressed by hemodialysis. Started IV Vancomycin and IV Zosyn on 01/08/2019. DC IV vancomycin on 01/09/2019 with negative MRSA screening. Continue bronchodilators albuterol inhaler 2 puffs 3 times daily and ipratropium inhaler 2 puffs 3 times daily Continue Robitussin syrup twice daily x3 days Continue to maintain O2 saturation greater than 92% Continue incentive spirometer and flutter valve D-dimer significantly elevated greater than 6 now trending down. Continue to trend inflammatory markers  Acute hypoxic respiratory failure secondary to acute COVID-19 viral pneumonia, improving Not on oxygen supplementation at baseline D-dimer significantly elevated, PE and B/L LE DVT ruled out On 4L with O2 sat 99% Wean off O2 as tolerated Home O2  eval for dc planning after HD, will order for tomorrow.  ESRD on HD TTS Hemodialysis per nephrology.  Acute on chronic systolic CHF Last 2D echo done on 04/25/2018 showed LVEF 35 to 40% with inferior septal hypokinesis Chest x-ray admission showing cardiomegaly with increase in pulmonary vascularity. Elevated BNP greater than 2300. Volume status addressed by hemodialysis  Resolved intermittent hiccups Post symptomatic treatment  Essential hypertension Continue home medications Continue to monitor vital signs Avoid hypotension  Insulin-dependent type 2 diabetes Hemoglobin A1c 6.4 on 01/07/2019. Continue insulin coverage, adjust as needed Avoid hypoglycemia  DVT prophylaxis: Subcu heparin 3 times daily Code Status: Full code Family Communication: None at bedside  Disposition Plan:  Will plan for discharge once respiratory status is close to baseline likely in the next 24-48 h or when nephrology signs off.  Will need HD outpatient spot set up prior to dc.  Consults called: Nephrology     Objective: Vitals:   01/11/19 0930 01/11/19 1030 01/11/19 1100 01/11/19 1127  BP: (!) 96/52 (!) 97/55 103/60 (!) 127/56  Pulse: 74 75 76 63  Resp:    (!) 23  Temp:    97.7 F (36.5 C)  TempSrc:    Oral  SpO2:    99%  Weight:    77.4 kg    Intake/Output Summary (Last 24 hours) at 01/11/2019 1220 Last data filed at 01/11/2019 1116 Gross per 24 hour  Intake 327 ml  Output 2226 ml  Net -1899 ml   Filed Weights   01/11/19 0500 01/11/19 0809 01/11/19 1127  Weight: 79.7 kg 78.9 kg 77.4 kg    Exam:  . General: 69 y.o. year-old male present with) nausea no acute  stress.  Alert oriented x3.   . Cardiovascular: Regular rate and rhythm no rubs or gallops.   [name] Kitchen Respiratory: Clear to auscultation no wheezes or rales.  Poor inspiratory effort.   . Abdomen: Soft nontender nondistended normal bowel sounds present Musculoskeletal: No lower extremity edema bilaterally. Psychiatry: Mood is  appropriate for condition and setting.  Data Reviewed: CBC: Recent Labs  Lab 01/07/19 0647 01/08/19 0357 01/09/19 0507 01/10/19 0325 01/11/19 0510  WBC 8.5 11.1* 11.8* 10.6* 12.4*  NEUTROABS 7.9* 10.3* 10.9* 9.3* 10.7*  HGB 9.1* 9.5* 8.9* 9.9* 9.3*  HCT 28.7* 27.9* 27.0* 29.7* 27.7*  MCV 94.1 89.1 90.0 88.7 87.9  PLT 266 346 348 364 [id number]   Basic Metabolic Panel: Recent Labs  Lab 01/07/19 0647 01/08/19 0357 01/09/19 0507 01/10/19 0325 01/11/19 0510  NA 138 136 132* 132* 130*  K 5.1 4.3 4.8 4.5 4.9  CL 101 96* 94* 92* 90*  CO2 17* 22 18* 19* 20*  GLUCOSE 237* 241* 380* 222* 346*  BUN 124* 97* 135* 111* 148*  CREATININE 11.81* 8.82* 10.02* 8.11* 10.03*  CALCIUM 7.7* 7.9* 7.6* 7.5* 7.6*  MG 2.3 2.2 2.3 2.1 2.4  PHOS 5.6* 6.0* 7.6* 7.3* 8.8*   GFR: Estimated Creatinine Clearance: 7.1 mL/min (A) (by C-G formula based on SCr of 10.03 mg/dL (H)). Liver Function Tests: Recent Labs  Lab 01/07/19 0647 01/08/19 0357 01/09/19 0507 01/10/19 0325 01/11/19 0510  AST 47* 51* 37 33 24  ALT 32 35 31 29 23   ALKPHOS 70 85 102 101 89  BILITOT 0.8 0.8 0.7 0.7 0.9  PROT 6.6 6.8 6.3* 6.6 6.4*  ALBUMIN 2.5* 2.5* 2.4* 2.5* 2.2*   No results for input(s): LIPASE, AMYLASE in the last 168 hours. No results for input(s): AMMONIA in the last 168 hours. Coagulation Profile: No results for input(s): INR, PROTIME in the last 168 hours. Cardiac Enzymes: No results for input(s): CKTOTAL, CKMB, CKMBINDEX, TROPONINI in the last 168 hours. BNP (last 3 results) No results for input(s): PROBNP in the last 8760 hours. HbA1C: No results for input(s): HGBA1C in the last 72 hours. CBG: Recent Labs  Lab 01/10/19 1346 01/10/19 1754 01/10/19 2030 01/11/19 0641 01/11/19 1152  GLUCAP 156* 169* 269* 286* 131*   Lipid Profile: No results for input(s): CHOL, HDL, LDLCALC, TRIG, CHOLHDL, LDLDIRECT in the last 72 hours. Thyroid Function Tests: No results for input(s): TSH, T4TOTAL, FREET4,  T3FREE, THYROIDAB in the last 72 hours. Anemia Panel: Recent Labs    01/10/19 0325 01/11/19 0510  FERRITIN 3,024* 2,826*   Urine analysis:    Component Value Date/Time   COLORURINE YELLOW 01/06/2019 1600   APPEARANCEUR CLEAR 01/06/2019 1600   LABSPEC 1.014 01/06/2019 1600   PHURINE 5.0 01/06/2019 1600   GLUCOSEU 150 (A) 01/06/2019 1600   HGBUR SMALL (A) 01/06/2019 1600   BILIRUBINUR NEGATIVE 01/06/2019 1600   KETONESUR NEGATIVE 01/06/2019 1600   PROTEINUR 100 (A) 01/06/2019 1600   NITRITE NEGATIVE 01/06/2019 1600   LEUKOCYTESUR NEGATIVE 01/06/2019 1600   Sepsis Labs: @LABRCNTIP (procalcitonin:4,lacticidven:4)  ) Recent Results (from the past 240 hour(s))  Culture, blood (routine x 2)     Status: None   Collection Time: 01/06/19  4:00 PM   Specimen: BLOOD  Result Value Ref Range Status   Specimen Description BLOOD RIGHT ANTECUBITAL  Final   Special Requests   Final    BOTTLES DRAWN AEROBIC AND ANAEROBIC Blood Culture adequate volume   Culture   Final    NO GROWTH 5 DAYS Performed  at [facility], [place] [address], [place], [place] [address]    Report Status 01/11/2019 FINAL  Final  Culture, blood (routine x 2)     Status: None   Collection Time: 01/06/19  4:01 PM   Specimen: BLOOD RIGHT WRIST  Result Value Ref Range Status   Specimen Description BLOOD RIGHT WRIST  Final   Special Requests   Final    BOTTLES DRAWN AEROBIC AND ANAEROBIC Blood Culture adequate volume   Culture   Final    NO GROWTH 5 DAYS Performed at [facility], [place] [address] [place], [place] [address]    Report Status 01/11/2019 FINAL  Final  Urine culture     Status: None   Collection Time: 01/06/19  4:10 PM   Specimen: Urine, Clean Catch  Result Value Ref Range Status   Specimen Description URINE, CLEAN CATCH  Final   Special Requests NONE  Final   Culture   Final    NO GROWTH Performed at [facility], [place] [address], [place], [place] [address]    Report Status  01/07/2019 FINAL  Final  MRSA PCR Screening     Status: None   Collection Time: 01/08/19  5:53 PM   Specimen: Nasal Mucosa; Nasopharyngeal  Result Value Ref Range Status   MRSA by PCR NEGATIVE NEGATIVE Final    Comment:        The GeneXpert MRSA Assay (FDA approved for NASAL specimens only), is one component of a comprehensive MRSA colonization surveillance program. It is not intended to diagnose MRSA infection nor to guide or monitor treatment for MRSA infections. Performed at [facility], [place] [address], [place],  [address]       Studies: CT ANGIO CHEST PE W OR WO CONTRAST  Result Date: 01/10/2019 CLINICAL DATA:  Hypoxemia.  COVID positive.  Elevated D-dimer. EXAM: CT ANGIOGRAPHY CHEST WITH CONTRAST TECHNIQUE: Multidetector CT imaging of the chest was performed using the standard protocol during bolus administration of intravenous contrast. Multiplanar CT image reconstructions and MIPs were obtained to evaluate the vascular anatomy. CONTRAST:  156mL OMNIPAQUE IOHEXOL 350 MG/ML SOLN COMPARISON:  None. FINDINGS: Cardiovascular: No filling defects in the pulmonary arteries to suggest pulmonary emboli. Heart is borderline in size. Diffusely calcified coronary arteries. Aortic atherosclerosis. No aneurysm. Small to medium size pericardial effusion. Mediastinum/Nodes: No mediastinal, hilar, or axillary adenopathy. Trachea and esophagus are unremarkable. Thyroid unremarkable. Lungs/Pleura: Small left pleural effusion. Ground-glass airspace disease throughout both lungs most compatible with COVID pneumonia. Upper Abdomen: Imaging into the upper abdomen shows no acute findings. Musculoskeletal: Chest wall soft tissues are unremarkable. No acute bony abnormality. Review of the MIP images confirms the above findings. IMPRESSION: No evidence of pulmonary embolus. Extensive ground-glass airspace disease throughout the lungs most compatible with COVID pneumonia. Mild cardiomegaly.  Small to  medium size pericardial effusion. Extensive coronary artery disease. Small left pleural effusion. Aortic Atherosclerosis (ICD10-I70.0). Electronically Signed   By: Rolm Baptise M.D.   On: 01/10/2019 13:05   VAS [place] LOWER EXTREMITY VENOUS (DVT)  Result Date: 01/10/2019  Lower Venous Study Indications: Pulmonary embolism. Other Indications: COVID 19. Anticoagulation: Heparin. Performing Technologist: Darlina Sicilian RDCS  Examination Guidelines: A complete evaluation includes B-mode imaging, spectral Doppler, color Doppler, and power Doppler as needed of all accessible portions of each vessel. Bilateral testing is considered an integral part of a complete examination. Limited examinations for reoccurring indications may be performed as noted.  +-------+---------------+---------+-----------+----------+--------------+ RIGHT  CompressibilityPhasicitySpontaneityPropertiesThrombus Aging +-------+---------------+---------+-----------+----------+--------------+  CFV    Full           Yes      Yes                                 +-------+---------------+---------+-----------+----------+--------------+ SFJ    Full                                                        +-------+---------------+---------+-----------+----------+--------------+ FV ProxFull                                                        +-------+---------------+---------+-----------+----------+--------------+ FV Mid Full                                                        +-------+---------------+---------+-----------+----------+--------------+ POP    Full           Yes      Yes                                 +-------+---------------+---------+-----------+----------+--------------+ PTV    Full                                                        +-------+---------------+---------+-----------+----------+--------------+ PERO   Full                                                         +-------+---------------+---------+-----------+----------+--------------+   +---------+---------------+---------+-----------+----------+--------------+ LEFT     CompressibilityPhasicitySpontaneityPropertiesThrombus Aging +---------+---------------+---------+-----------+----------+--------------+ CFV      Full           Yes      Yes                                 +---------+---------------+---------+-----------+----------+--------------+ SFJ      Full                                                        +---------+---------------+---------+-----------+----------+--------------+ FV Prox  Full                                                        +---------+---------------+---------+-----------+----------+--------------+  FV Mid   Full                                                        +---------+---------------+---------+-----------+----------+--------------+ FV DistalFull                                                        +---------+---------------+---------+-----------+----------+--------------+ POP      Full           Yes      Yes                                 +---------+---------------+---------+-----------+----------+--------------+ PTV      Full                                                        +---------+---------------+---------+-----------+----------+--------------+ PERO     Full                                                        +---------+---------------+---------+-----------+----------+--------------+    Summary: Right: There is no evidence of deep vein thrombosis in the lower extremity. No cystic structure found in the popliteal fossa. Left: There is no evidence of deep vein thrombosis in the lower extremity. No cystic structure found in the popliteal fossa.  *See table(s) above for measurements and observations.    Preliminary     Scheduled Meds: . albuterol  2 puff Inhalation Q8H  . aspirin EC  81 mg Oral Daily  .  calcitRIOL  0.5 mcg Oral Q T,Th,Sa-HD  . Chlorhexidine Gluconate Cloth  6 each Topical Daily  . Chlorhexidine Gluconate Cloth  6 each Topical Q0600  . Chlorhexidine Gluconate Cloth  6 each Topical Q0600  . darbepoetin (ARANESP) injection - DIALYSIS  60 mcg Intravenous Q Thu-HD  . dexamethasone (DECADRON) injection  6 mg Intravenous Q24H  . docusate sodium  100 mg Oral BID  . heparin injection (subcutaneous)  7,500 Units Subcutaneous Q8H  . insulin aspart  0-15 Units Subcutaneous TID WC  . insulin aspart  0-5 Units Subcutaneous QHS  . insulin aspart  4 Units Subcutaneous TID WC  . insulin detemir  8 Units Subcutaneous BID  . ipratropium  2 puff Inhalation Q8H  . isosorbide mononitrate  30 mg Oral QHS  . metoprolol succinate  12.5 mg Oral QHS  . multivitamin  1 tablet Oral QHS  . rosuvastatin  40 mg Oral Daily  . sevelamer carbonate  2,400 mg Oral TID WC    Continuous Infusions: . sodium chloride    . sodium chloride    . piperacillin-tazobactam (ZOSYN)  IV 2.25 g (01/11/19 0155)     LOS: 5 days     Kayleen Memos, MD [facility] Pager [phone]  If  7PM-7AM, please contact night-coverage [email or web address] Password [email or web address] 01/11/2019, 12:20 PM

## 2019-01-11 NOTE — Progress Notes (Signed)
[facility] NEPHROLOGY PROGRESS NOTE  Assessment/ Plan: Pt is a 69 y.o. yo male with DM, HTN, CHF, AAS, ESRD on HD, missed 3 HD treatment with COVID-19 PNA.  Dialysis Orders: [facility]: [facility]  on TTS . EDW 85  HD Bath 3k, 2 ca  Time 4hr 15 min  [id number] . Access  L RC AVF   / trouble with cannulating / ( CKV APT Pend )  Using perm cath  Calcitriol 0.5   mcg po /HD  Mircera 60  ( last on 12/20/18 )  Venofer 50 q wk  #Acute COVID-19 pneumonia with hypoxia: supplemental oxygen, s/p remdesivir and on decadron per primary team.  # Acute hypoxic resp failure  - felt mostly 2/2 covid - optimize volume status - cramped when attempted more UF on 1/7 - on supplemental oxygen with requirement improving  # ESRD  - HD per TTS schedule.   - TDC and heparin with HD   - Needs 3 hour treatment - unable to shorten to 2.5 due to clearance.    # Anemia of CKD: Continue ESA.  IV iron off.    # Secondary hyperparathyroidism: Continue Renvela.  Low phosphorus diet.  HD today.  # HTN/volume: optimize volume with HD.     Subjective:  Personally examined with PPE on 1/9.  He has been weaned to 4 liters oxygen.  States doesn't feel well but denies any overt shortness of breath.  Not normally on oxygen at home. Confirms we've been using tunneled catheter.  Review of systems:     Denies Shortness of breath or cp Finally got rid of hiccups. No n/v Has appetite   Objective Vital signs in last 24 hours: Vitals:   01/10/19 1801 01/10/19 2214 01/10/19 2306 01/11/19 0500  BP: (!) 148/63 (!) 154/59 (!) 144/55   Pulse:  64 70   Resp: 20     Temp: 97.9 F (36.6 C)  97.8 F (36.6 C)   TempSrc: Oral Oral Oral   SpO2: 98% 96% 97%   Weight:    79.7 kg   Weight change:   Intake/Output Summary (Last 24 hours) at 01/11/2019 0746 Last data filed at 01/11/2019 0550 Gross per 24 hour  Intake 567 ml  Output 900 ml  Net -333 ml    Physical exam  General adult male in bed in no acute distress  at rest HEENT normocephalic atraumatic extraocular movements intact sclera anicteric Neck supple trachea midline Lungs clear but diminished on 4 liters oxygen   Heart S1 S2 no rub Abdomen soft nontender nondistended Extremities he has no edema Psych normal mood and affect Access :LUE AVF with bruit and thrill; RIJ tunneled catheter   Labs: Basic Metabolic Panel: Recent Labs  Lab 01/09/19 0507 01/10/19 0325 01/11/19 0510  NA 132* 132* 130*  K 4.8 4.5 4.9  CL 94* 92* 90*  CO2 18* 19* 20*  GLUCOSE 380* 222* 346*  BUN 135* 111* 148*  CREATININE 10.02* 8.11* 10.03*  CALCIUM 7.6* 7.5* 7.6*  PHOS 7.6* 7.3* 8.8*   Liver Function Tests: Recent Labs  Lab 01/09/19 0507 01/10/19 0325 01/11/19 0510  AST 37 33 24  ALT 31 29 23   ALKPHOS 102 101 89  BILITOT 0.7 0.7 0.9  PROT 6.3* 6.6 6.4*  ALBUMIN 2.4* 2.5* 2.2*   CBC: Recent Labs  Lab 01/07/19 0647 01/08/19 0357 01/09/19 0507 01/10/19 0325 01/11/19 0510  WBC 8.5 11.1* 11.8* 10.6* 12.4*  NEUTROABS 7.9* 10.3* 10.9* 9.3* PENDING  HGB 9.1*  9.5* 8.9* 9.9* 9.3*  HCT 28.7* 27.9* 27.0* 29.7* 27.7*  MCV 94.1 89.1 90.0 88.7 87.9  PLT 266 346 348 364 362   Cardiac Enzymes: No results for input(s): CKTOTAL, CKMB, CKMBINDEX, TROPONINI in the last 168 hours. CBG: Recent Labs  Lab 01/10/19 0912 01/10/19 1346 01/10/19 1754 01/10/19 2030 01/11/19 0641  GLUCAP 206* 156* 169* 269* 286*    Iron Studies:  Recent Labs    01/10/19 0325  FERRITIN 3,024*   Studies/Results: CT ANGIO CHEST PE W OR WO CONTRAST  Result Date: 01/10/2019 CLINICAL DATA:  Hypoxemia.  COVID positive.  Elevated D-dimer. EXAM: CT ANGIOGRAPHY CHEST WITH CONTRAST TECHNIQUE: Multidetector CT imaging of the chest was performed using the standard protocol during bolus administration of intravenous contrast. Multiplanar CT image reconstructions and MIPs were obtained to evaluate the vascular anatomy. CONTRAST:  138mL OMNIPAQUE IOHEXOL 350 MG/ML SOLN COMPARISON:   None. FINDINGS: Cardiovascular: No filling defects in the pulmonary arteries to suggest pulmonary emboli. Heart is borderline in size. Diffusely calcified coronary arteries. Aortic atherosclerosis. No aneurysm. Small to medium size pericardial effusion. Mediastinum/Nodes: No mediastinal, hilar, or axillary adenopathy. Trachea and esophagus are unremarkable. Thyroid unremarkable. Lungs/Pleura: Small left pleural effusion. Ground-glass airspace disease throughout both lungs most compatible with COVID pneumonia. Upper Abdomen: Imaging into the upper abdomen shows no acute findings. Musculoskeletal: Chest wall soft tissues are unremarkable. No acute bony abnormality. Review of the MIP images confirms the above findings. IMPRESSION: No evidence of pulmonary embolus. Extensive ground-glass airspace disease throughout the lungs most compatible with COVID pneumonia. Mild cardiomegaly.  Small to medium size pericardial effusion. Extensive coronary artery disease. Small left pleural effusion. Aortic Atherosclerosis (ICD10-I70.0). Electronically Signed   By: Rolm Baptise M.D.   On: 01/10/2019 13:05   VAS [place] LOWER EXTREMITY VENOUS (DVT)  Result Date: 01/10/2019  Lower Venous Study Indications: Pulmonary embolism. Other Indications: COVID 19. Anticoagulation: Heparin. Performing Technologist: Darlina Sicilian RDCS  Examination Guidelines: A complete evaluation includes B-mode imaging, spectral Doppler, color Doppler, and power Doppler as needed of all accessible portions of each vessel. Bilateral testing is considered an integral part of a complete examination. Limited examinations for reoccurring indications may be performed as noted.  +-------+---------------+---------+-----------+----------+--------------+ RIGHT  CompressibilityPhasicitySpontaneityPropertiesThrombus Aging +-------+---------------+---------+-----------+----------+--------------+ CFV    Full           Yes      Yes                                  +-------+---------------+---------+-----------+----------+--------------+ SFJ    Full                                                        +-------+---------------+---------+-----------+----------+--------------+ FV ProxFull                                                        +-------+---------------+---------+-----------+----------+--------------+ FV Mid Full                                                        +-------+---------------+---------+-----------+----------+--------------+  POP    Full           Yes      Yes                                 +-------+---------------+---------+-----------+----------+--------------+ PTV    Full                                                        +-------+---------------+---------+-----------+----------+--------------+ PERO   Full                                                        +-------+---------------+---------+-----------+----------+--------------+   +---------+---------------+---------+-----------+----------+--------------+ LEFT     CompressibilityPhasicitySpontaneityPropertiesThrombus Aging +---------+---------------+---------+-----------+----------+--------------+ CFV      Full           Yes      Yes                                 +---------+---------------+---------+-----------+----------+--------------+ SFJ      Full                                                        +---------+---------------+---------+-----------+----------+--------------+ FV Prox  Full                                                        +---------+---------------+---------+-----------+----------+--------------+ FV Mid   Full                                                        +---------+---------------+---------+-----------+----------+--------------+ FV DistalFull                                                         +---------+---------------+---------+-----------+----------+--------------+ POP      Full           Yes      Yes                                 +---------+---------------+---------+-----------+----------+--------------+ PTV      Full                                                        +---------+---------------+---------+-----------+----------+--------------+  PERO     Full                                                        +---------+---------------+---------+-----------+----------+--------------+    Summary: Right: There is no evidence of deep vein thrombosis in the lower extremity. No cystic structure found in the popliteal fossa. Left: There is no evidence of deep vein thrombosis in the lower extremity. No cystic structure found in the popliteal fossa.  *See table(s) above for measurements and observations.    Preliminary     Medications: Infusions: . piperacillin-tazobactam (ZOSYN)  IV 2.25 g (01/11/19 0155)    Scheduled Medications: . albuterol  2 puff Inhalation Q8H  . aspirin EC  81 mg Oral Daily  . calcitRIOL  0.5 mcg Oral Q T,Th,Sa-HD  . Chlorhexidine Gluconate Cloth  6 each Topical Daily  . Chlorhexidine Gluconate Cloth  6 each Topical Q0600  . Chlorhexidine Gluconate Cloth  6 each Topical Q0600  . darbepoetin (ARANESP) injection - DIALYSIS  60 mcg Intravenous Q Thu-HD  . dexamethasone (DECADRON) injection  6 mg Intravenous Q24H  . docusate sodium  100 mg Oral BID  . heparin injection (subcutaneous)  7,500 Units Subcutaneous Q8H  . insulin aspart  0-15 Units Subcutaneous TID WC  . insulin aspart  0-5 Units Subcutaneous QHS  . insulin aspart  4 Units Subcutaneous TID WC  . insulin detemir  8 Units Subcutaneous BID  . ipratropium  2 puff Inhalation Q8H  . isosorbide mononitrate  30 mg Oral QHS  . metoprolol succinate  12.5 mg Oral QHS  . multivitamin  1 tablet Oral QHS  . rosuvastatin  40 mg Oral Daily  . sevelamer carbonate  2,400 mg Oral TID WC     have reviewed scheduled and prn medications.   Marlane Hatcher Ranika Mcniel 01/11/2019,7:46 AM  LOS: 5 days

## 2019-01-12 LAB — COMPREHENSIVE METABOLIC PANEL
ALT: 21 U/L (ref 0–44)
AST: 22 U/L (ref 15–41)
Albumin: 2.1 g/dL — ABNORMAL LOW (ref 3.5–5.0)
Alkaline Phosphatase: 84 U/L (ref 38–126)
Anion gap: 20 — ABNORMAL HIGH (ref 5–15)
BUN: 96 mg/dL — ABNORMAL HIGH (ref 8–23)
CO2: 20 mmol/L — ABNORMAL LOW (ref 22–32)
Calcium: 7.8 mg/dL — ABNORMAL LOW (ref 8.9–10.3)
Chloride: 89 mmol/L — ABNORMAL LOW (ref 98–111)
Creatinine, Ser: 7.59 mg/dL — ABNORMAL HIGH (ref 0.61–1.24)
GFR calc Af Amer: 8 mL/min — ABNORMAL LOW (ref >60)
GFR calc non Af Amer: 7 mL/min — ABNORMAL LOW (ref >60)
Glucose, Bld: 457 mg/dL — ABNORMAL HIGH (ref 70–99)
Potassium: 4.8 mmol/L (ref 3.5–5.1)
Sodium: 129 mmol/L — ABNORMAL LOW (ref 135–145)
Total Bilirubin: 0.7 mg/dL (ref 0.3–1.2)
Total Protein: 6.1 g/dL — ABNORMAL LOW (ref 6.5–8.1)

## 2019-01-12 LAB — CBC WITH DIFFERENTIAL/PLATELET
Abs Immature Granulocytes: 0.91 10*3/uL — ABNORMAL HIGH (ref 0.00–0.07)
Basophils Absolute: 0 10*3/uL (ref 0.0–0.1)
Basophils Relative: 0 %
Eosinophils Absolute: 0 10*3/uL (ref 0.0–0.5)
Eosinophils Relative: 0 %
HCT: 27.3 % — ABNORMAL LOW (ref 39.0–52.0)
Hemoglobin: 9.1 g/dL — ABNORMAL LOW (ref 13.0–17.0)
Immature Granulocytes: 6 %
Lymphocytes Relative: 2 %
Lymphs Abs: 0.3 10*3/uL — ABNORMAL LOW (ref 0.7–4.0)
MCH: 29.9 pg (ref 26.0–34.0)
MCHC: 33.3 g/dL (ref 30.0–36.0)
MCV: 89.8 fL (ref 80.0–100.0)
Monocytes Absolute: 0.4 10*3/uL (ref 0.1–1.0)
Monocytes Relative: 3 %
Neutro Abs: 12.7 10*3/uL — ABNORMAL HIGH (ref 1.7–7.7)
Neutrophils Relative %: 89 %
Platelets: 335 10*3/uL (ref 150–400)
RBC: 3.04 MIL/uL — ABNORMAL LOW (ref 4.22–5.81)
RDW: 15.3 % (ref 11.5–15.5)
WBC: 14.9 10*3/uL — ABNORMAL HIGH (ref 4.0–10.5)
nRBC: 0 % (ref 0.0–0.2)

## 2019-01-12 LAB — GLUCOSE, CAPILLARY
Glucose-Capillary: 367 mg/dL — ABNORMAL HIGH (ref 70–99)
Glucose-Capillary: 343 mg/dL — ABNORMAL HIGH (ref 70–99)
Glucose-Capillary: 372 mg/dL — ABNORMAL HIGH (ref 70–99)
Glucose-Capillary: 308 mg/dL — ABNORMAL HIGH (ref 70–99)

## 2019-01-12 LAB — D-DIMER, QUANTITATIVE: D-Dimer, Quant: 3.01 ug/mL-FEU — ABNORMAL HIGH (ref 0.00–0.50)

## 2019-01-12 LAB — C-REACTIVE PROTEIN: CRP: 10.6 mg/dL — ABNORMAL HIGH (ref <1.0)

## 2019-01-12 LAB — FERRITIN: Ferritin: 2899 ng/mL — ABNORMAL HIGH (ref 24–336)

## 2019-01-12 MED ORDER — HYDRALAZINE HCL 25 MG PO TABS
25.0000 mg | ORAL_TABLET | Freq: Three times a day (TID) | ORAL | Status: DC
  Administered 2019-01-12 – 2019-01-13 (×5): 25 mg via ORAL
  Filled 2019-01-12 (×4): qty 1

## 2019-01-12 NOTE — Evaluation (Signed)
Physical Therapy Evaluation Patient Details Name: Justin [name] MRN: [id number] DOB: December 15, 1950 Today's Date: 01/12/2019   History of Present Illness  69 yo adm with covid PNA. Had missed 3 HD sessions. PMH - DM, HTN, chf, ESRD on HD.  Clinical Impression  Pt admitted with above. 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 Pt very eager to return home. Pt motivated and moving in room with rolling walker. Anticipate pt to progress quickly and become mod I functioning with or without RW for safe d/c home. Pt reports he has friends and family that can help him but he will not stay with anyone and no one will stay with him. Pt on 2lo2 via [place], droped to 87% during amb for 40' with RW, pt quickly recovered. Acute PT to cont to follow to progress indep.    Follow Up Recommendations No PT follow up;Supervision - Intermittent(pt deferred PT services stating "i'll be fine")    Equipment Recommendations  None recommended by PT(pt states his mother has a lot)    Recommendations for Other Services       Precautions / Restrictions Precautions Precautions: None Precaution Comments: on 2 LO2 via [place], watch O2 sats Restrictions Weight Bearing Restrictions: No      Mobility  Bed Mobility Overal bed mobility: Modified Independent             General bed mobility comments: HOB elevated, no physical assist needed  Transfers Overall transfer level: Needs assistance Equipment used: Rolling walker (2 wheeled) Transfers: Sit to/from Stand Sit to Stand: Supervision         General transfer comment: supervision for safety, pt with good hand placement  Ambulation/Gait Ambulation/Gait assistance: Min guard Gait Distance (Feet): 40 Feet(limited to room due to COVID+) Assistive device: Rolling walker (2 wheeled) Gait Pattern/deviations: Step-through pattern;Decreased stride length Gait velocity: slow Gait velocity interpretation: 1.31 - 2.62 ft/sec, indicative of limited community ambulator General  Gait Details: SpO2 dec to 87% on 2LO2 via [place]  Stairs            Wheelchair Mobility    Modified Rankin (Stroke Patients Only)       Balance Overall balance assessment: Mild deficits observed, not formally tested                                           Pertinent Vitals/Pain Pain Assessment: No/denies pain    Home Living Family/patient expects to be discharged to:: Private residence Living Arrangements: Alone   Type of Home: House Home Access: Stairs to enter Entrance Stairs-Rails: Right Entrance Stairs-Number of Steps: 3-4 Home Layout: One level Home Equipment: Walker - 2 wheels;Grab bars - tub/shower;Grab bars - toilet      Prior Function Level of Independence: Independent         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, drives self to HD     Hand Dominance   Dominant Hand: Right    Extremity/Trunk Assessment   Upper Extremity Assessment Upper Extremity Assessment: Overall WFL for tasks assessed    Lower Extremity Assessment Lower Extremity Assessment: Overall WFL for tasks assessed    Cervical / Trunk Assessment Cervical / Trunk Assessment: Normal  Communication   Communication: No difficulties  Cognition Arousal/Alertness: Awake/alert Behavior During Therapy: WFL for tasks assessed/performed Overall Cognitive Status: Within Functional Limits for tasks assessed  General Comments: pt frustrated with dialysis center for not sharing about covid outbreak because thats how he got it      General Comments General comments (skin integrity, edema, etc.): SpO2 dec to 87% with amb in room, recovered into 90s s/p 2 min on 2lo2 via [place]    Exercises     Assessment/Plan    PT Assessment Patient needs continued PT services  PT Problem List Decreased strength;Decreased activity tolerance;Decreased balance;Decreased mobility;Decreased knowledge of use of DME       PT Treatment Interventions DME  instruction;Gait training;Stair training;Functional mobility training;Therapeutic activities;Therapeutic exercise;Balance training;Neuromuscular re-education    PT Goals (Current goals can be found in the Care Plan section)  Acute Rehab PT Goals 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    Frequency Min 3X/week   Barriers to discharge        Co-evaluation               AM-PAC PT "6 Clicks" Mobility  Outcome Measure Help needed turning from your back to your side while in a flat bed without using bedrails?: None Help needed moving from lying on your back to sitting on the side of a flat bed without using bedrails?: None Help needed moving to and from a bed to a chair (including a wheelchair)?: None Help needed standing up from a chair using your arms (e.g., wheelchair or bedside chair)?: None Help needed to walk in hospital room?: A Little Help needed climbing 3-5 steps with a railing? : A Little 6 Click Score: 22    End of Session   Activity Tolerance: Patient tolerated treatment well Patient left: in chair;with call bell/phone within reach Nurse Communication: Mobility status PT Visit Diagnosis: Unsteadiness on feet (R26.81);Muscle weakness (generalized) (M62.81);Difficulty in walking, not elsewhere classified (R26.2)    Time: [id number] PT Time Calculation (min) (ACUTE ONLY): 21 min   Charges:   PT Evaluation $PT Eval Moderate Complexity: 1 Mod          Kittie Plater, PT, DPT Acute Rehabilitation Services Pager #: [phone] Office #: [phone]   Berline Lopes 01/12/2019, 2:45 PM

## 2019-01-12 NOTE — Progress Notes (Signed)
Updated his friend Juliann Pulse [phone] at the patient's request.  All questions answered.

## 2019-01-12 NOTE — Progress Notes (Signed)
[name] NEPHROLOGY PROGRESS NOTE  Assessment/ Plan: Pt is a 69 y.o. yo male with DM, HTN, CHF, AAS, ESRD on HD, missed 3 HD treatment with COVID-19 PNA.  Dialysis Orders: [facility]: [facility]  on TTS . EDW 85  HD Bath 3k, 2 ca  Time 4hr 15 min  [id number] . Access  L RC AVF   / trouble with cannulating / ( CKV APT Pend )  Using perm cath  Calcitriol 0.5   mcg po /HD  Mircera 60  ( last on 12/20/18 )  Venofer 50 q wk  #Acute COVID-19 pneumonia with hypoxia: supplemental oxygen, s/p remdesivir and on decadron per primary team.  # Acute hypoxic resp failure  - 2/2 covid - optimize volume status with HD  - on supplemental oxygen with requirement improving  # ESRD  - HD per TTS schedule.   - using TDC and heparin with HD   - Needs 3 hour treatment - unable to shorten to 2.5 due to clearance.   - note he is below EDW - s/p UF with attempt to optimize resp status.  Will need to adjust on discharge based on his tolerance  # Anemia of CKD: Continue ESA.  IV iron off.    # Secondary hyperparathyroidism: Continue Renvela.  Low phosphorus diet. Update phos in AM. On calcitriol.   # HTN/volume: optimized volume with HD.  Add hydralazine at 25 mg TID for now.  Note on 37.5 mg TID at home but below EDW  # Hyponatremia corrects when adjusted for hyperglycemia  - DM per primary team.  note steroids   Subjective:  Personally examined with PPE on 1/10.  He has been weaned to 2 liters oxygen.  Had HD on 1/9 with 1.4 kg UF.  Feeling a little better today but still feels unwell.  Review of systems:   Denies Shortness of breath or cp intermittent hiccups - better No n/v Has appetite - taking PO   Objective Vital signs in last 24 hours: Vitals:   01/11/19 1127 01/11/19 1625 01/11/19 2233 01/12/19 0322  BP: (!) 127/56 (!) 109/42 (!) 170/57   Pulse: 63 66    Resp: (!) 23 16 16    Temp: 97.7 F (36.5 C) 97.8 F (36.6 C) 98 F (36.7 C)   TempSrc: Oral Oral Oral   SpO2: 99% 93%  99% 98%  Weight: 77.4 kg      Weight change: -0.8 kg  Intake/Output Summary (Last 24 hours) at 01/12/2019 [id number] Last data filed at 01/12/2019 0541 Gross per 24 hour  Intake 150 ml  Output 1476 ml  Net -1326 ml    Physical exam   General adult male in bed in no acute distress at rest HEENT normocephalic atraumatic extraocular movements intact sclera anicteric Neck supple trachea midline Lungs clear but diminished on 2 liters oxygen; unlabored at rest; no crackles Heart S1 S2 no rub Abdomen soft nontender nondistended Extremities he has no edema Psych normal mood and affect Neuro - alert and oriented x 3 and provides hx follows commands Access :LUE AVF with bruit and thrill; RIJ tunneled catheter   Labs: Basic Metabolic Panel: Recent Labs  Lab 01/09/19 0507 01/10/19 0325 01/11/19 0510 01/12/19 0418  NA 132* 132* 130* 129*  K 4.8 4.5 4.9 4.8  CL 94* 92* 90* 89*  CO2 18* 19* 20* 20*  GLUCOSE 380* 222* 346* 457*  BUN 135* 111* 148* 96*  CREATININE 10.02* 8.11* 10.03* 7.59*  CALCIUM 7.6* 7.5* 7.6* 7.8*  PHOS 7.6* 7.3* 8.8*  --    Liver Function Tests: Recent Labs  Lab 01/10/19 0325 01/11/19 0510 01/12/19 0418  AST 33 24 22  ALT 29 23 21   ALKPHOS 101 89 84  BILITOT 0.7 0.9 0.7  PROT 6.6 6.4* 6.1*  ALBUMIN 2.5* 2.2* 2.1*   CBC: Recent Labs  Lab 01/08/19 0357 01/09/19 0507 01/10/19 0325 01/11/19 0510 01/12/19 0418  WBC 11.1* 11.8* 10.6* 12.4* 14.9*  NEUTROABS 10.3* 10.9* 9.3* 10.7* 12.7*  HGB 9.5* 8.9* 9.9* 9.3* 9.1*  HCT 27.9* 27.0* 29.7* 27.7* 27.3*  MCV 89.1 90.0 88.7 87.9 89.8  PLT 346 348 364 362 335   Cardiac Enzymes: No results for input(s): CKTOTAL, CKMB, CKMBINDEX, TROPONINI in the last 168 hours. CBG: Recent Labs  Lab 01/10/19 2030 01/11/19 0641 01/11/19 1152 01/11/19 1623 01/11/19 2102  GLUCAP 269* 286* 131* 286* 415*    Iron Studies:  Recent Labs    01/12/19 0418  FERRITIN 2,899*   Studies/Results: CT ANGIO CHEST PE W OR WO  CONTRAST  Result Date: 01/10/2019 CLINICAL DATA:  Hypoxemia.  COVID positive.  Elevated D-dimer. EXAM: CT ANGIOGRAPHY CHEST WITH CONTRAST TECHNIQUE: Multidetector CT imaging of the chest was performed using the standard protocol during bolus administration of intravenous contrast. Multiplanar CT image reconstructions and MIPs were obtained to evaluate the vascular anatomy. CONTRAST:  120mL OMNIPAQUE IOHEXOL 350 MG/ML SOLN COMPARISON:  None. FINDINGS: Cardiovascular: No filling defects in the pulmonary arteries to suggest pulmonary emboli. Heart is borderline in size. Diffusely calcified coronary arteries. Aortic atherosclerosis. No aneurysm. Small to medium size pericardial effusion. Mediastinum/Nodes: No mediastinal, hilar, or axillary adenopathy. Trachea and esophagus are unremarkable. Thyroid unremarkable. Lungs/Pleura: Small left pleural effusion. Ground-glass airspace disease throughout both lungs most compatible with COVID pneumonia. Upper Abdomen: Imaging into the upper abdomen shows no acute findings. Musculoskeletal: Chest wall soft tissues are unremarkable. No acute bony abnormality. Review of the MIP images confirms the above findings. IMPRESSION: No evidence of pulmonary embolus. Extensive ground-glass airspace disease throughout the lungs most compatible with COVID pneumonia. Mild cardiomegaly.  Small to medium size pericardial effusion. Extensive coronary artery disease. Small left pleural effusion. Aortic Atherosclerosis (ICD10-I70.0). Electronically Signed   By: Rolm Baptise M.D.   On: 01/10/2019 13:05   VAS [place] LOWER EXTREMITY VENOUS (DVT)  Result Date: 01/10/2019  Lower Venous Study Indications: Pulmonary embolism. Other Indications: COVID 19. Anticoagulation: Heparin. Performing Technologist: Darlina Sicilian RDCS  Examination Guidelines: A complete evaluation includes B-mode imaging, spectral Doppler, color Doppler, and power Doppler as needed of all accessible portions of each vessel. Bilateral  testing is considered an integral part of a complete examination. Limited examinations for reoccurring indications may be performed as noted.  +-------+---------------+---------+-----------+----------+--------------+ RIGHT  CompressibilityPhasicitySpontaneityPropertiesThrombus Aging +-------+---------------+---------+-----------+----------+--------------+ CFV    Full           Yes      Yes                                 +-------+---------------+---------+-----------+----------+--------------+ SFJ    Full                                                        +-------+---------------+---------+-----------+----------+--------------+ FV ProxFull                                                        +-------+---------------+---------+-----------+----------+--------------+  FV Mid Full                                                        +-------+---------------+---------+-----------+----------+--------------+ POP    Full           Yes      Yes                                 +-------+---------------+---------+-----------+----------+--------------+ PTV    Full                                                        +-------+---------------+---------+-----------+----------+--------------+ PERO   Full                                                        +-------+---------------+---------+-----------+----------+--------------+   +---------+---------------+---------+-----------+----------+--------------+ LEFT     CompressibilityPhasicitySpontaneityPropertiesThrombus Aging +---------+---------------+---------+-----------+----------+--------------+ CFV      Full           Yes      Yes                                 +---------+---------------+---------+-----------+----------+--------------+ SFJ      Full                                                        +---------+---------------+---------+-----------+----------+--------------+ FV Prox   Full                                                        +---------+---------------+---------+-----------+----------+--------------+ FV Mid   Full                                                        +---------+---------------+---------+-----------+----------+--------------+ FV DistalFull                                                        +---------+---------------+---------+-----------+----------+--------------+ POP      Full           Yes      Yes                                 +---------+---------------+---------+-----------+----------+--------------+  PTV      Full                                                        +---------+---------------+---------+-----------+----------+--------------+ PERO     Full                                                        +---------+---------------+---------+-----------+----------+--------------+    Summary: Right: There is no evidence of deep vein thrombosis in the lower extremity. No cystic structure found in the popliteal fossa. Left: There is no evidence of deep vein thrombosis in the lower extremity. No cystic structure found in the popliteal fossa.  *See table(s) above for measurements and observations.    Preliminary     Medications: Infusions: . sodium chloride    . sodium chloride      Scheduled Medications: . albuterol  2 puff Inhalation Q8H  . aspirin EC  81 mg Oral Daily  . calcitRIOL  0.5 mcg Oral Q T,Th,Sa-HD  . Chlorhexidine Gluconate Cloth  6 each Topical Daily  . Chlorhexidine Gluconate Cloth  6 each Topical Q0600  . Chlorhexidine Gluconate Cloth  6 each Topical Q0600  . darbepoetin (ARANESP) injection - DIALYSIS  60 mcg Intravenous Q Thu-HD  . dexamethasone (DECADRON) injection  6 mg Intravenous Q24H  . docusate sodium  100 mg Oral BID  . heparin injection (subcutaneous)  7,500 Units Subcutaneous Q8H  . insulin aspart  0-15 Units Subcutaneous TID WC  . insulin aspart  0-5 Units  Subcutaneous QHS  . insulin aspart  4 Units Subcutaneous TID WC  . insulin detemir  8 Units Subcutaneous BID  . ipratropium  2 puff Inhalation Q8H  . isosorbide mononitrate  30 mg Oral QHS  . metoprolol succinate  12.5 mg Oral QHS  . multivitamin  1 tablet Oral QHS  . rosuvastatin  40 mg Oral Daily  . sevelamer carbonate  2,400 mg Oral TID WC    have reviewed scheduled and prn medications.   Marlane Hatcher Jes Costales 01/12/2019,7:12 AM  LOS: 6 days

## 2019-01-12 NOTE — Progress Notes (Signed)
CBG=415 mg/dL, notified Bodenheimer and received 8 units of novolog. Will administer and continue to monitor.

## 2019-01-12 NOTE — Progress Notes (Signed)
PROGRESS NOTE  [name] [id number] DOB: [birth year] DOA: 01/06/2019 PCP: Reynold Bowen, MD  HPI/Recap of past 24 hours: [name] is a 69 y.o. male with medical history significant of ESRD on HD, TTS, IDDM, HTN, HLD, presented with increasing short of breath for 1 week.   Associated with generalized weakness and productive cough.  O2 saturation 83% on room air in the ED.  Chest x-ray showed multifocal infiltrates.  COVID-19 screening test positive on 01/06/2019.  Started on COVID-19 directed therapies.  CTA PE negative showed bilateral ground glass opacities typical of covid-19 viral pneumonia.  01/12/19: Seen and examined.  He has no new complaints.  Needs HD outpatient spot set up for DC planning.   Assessment/Plan: Active Problems:   COVID-19 virus infection   COVID-19   Acute COVID-19 viral pneumonia superimposed by HCAP, POA Presented with shortness of breath, hypoxia with positive COVID-19 in-house test on 01/06/2019. Continue remdesivir day number 5 out of 5 Continue IV Decadron 6 mg daily x10 days.  Initial procalcitonin elevated 1.53  BNP also significantly elevated greater than 2300 Volume status addressed by hemodialysis. Started IV Vancomycin and IV Zosyn on 01/08/2019. DC IV vancomycin on 01/09/2019 with negative MRSA screening. Continue bronchodilators albuterol inhaler 2 puffs 3 times daily and ipratropium inhaler 2 puffs 3 times daily Continue Robitussin syrup twice daily x3 days Continue to maintain O2 saturation greater than 92% Continue incentive spirometer and flutter valve D-dimer significantly elevated greater than 6 now trending down. Continue to trend inflammatory markers  Acute hypoxic respiratory failure secondary to acute COVID-19 viral pneumonia, improving Not on oxygen supplementation at baseline D-dimer significantly elevated, PE and B/L LE DVT ruled out On 4L with O2 sat 99% Wean off O2 as tolerated Home O2 eval for dc planning after HD, will  order for tomorrow.  ESRD on HD TTS Hemodialysis per nephrology.  Acute on chronic systolic CHF Last 2D echo done on 04/25/2018 showed LVEF 35 to 40% with inferior septal hypokinesis Chest x-ray admission showing cardiomegaly with increase in pulmonary vascularity. Elevated BNP greater than 2300. Volume status addressed by hemodialysis  Resolved intermittent hiccups Post symptomatic treatment  Essential hypertension Continue home medications Continue to monitor vital signs Avoid hypotension  Insulin-dependent type 2 diabetes Hemoglobin A1c 6.4 on 01/07/2019. Continue insulin coverage, adjust as needed Avoid hypoglycemia  DVT prophylaxis: Subcu heparin 3 times daily Code Status: Full code Family Communication: None at bedside  Disposition Plan:  Will plan for discharge when nephrology signs off.  Will need HD outpatient spot set up prior to dc.  Consults called: Nephrology     Objective: Vitals:   01/11/19 2233 01/12/19 0322 01/12/19 0757 01/12/19 1128  BP: (!) 170/57  (!) 181/65   Pulse:   (!) 59   Resp: 16  17   Temp: 98 F (36.7 C)  (!) 97.5 F (36.4 C)   TempSrc: Oral  Oral   SpO2: 99% 98% 97%   Weight:    77.4 kg  Height:    5\' 9"  (1.753 m)    Intake/Output Summary (Last 24 hours) at 01/12/2019 1428 Last data filed at 01/12/2019 0855 Gross per 24 hour  Intake 150 ml  Output 50 ml  Net 100 ml   Filed Weights   01/11/19 0809 01/11/19 1127 01/12/19 1128  Weight: 78.9 kg 77.4 kg 77.4 kg    Exam:  No change in physical exam from 01/11/19.  [name] Kitchen General: 69 y.o. year-old male present with) nausea no  acute stress.  Alert oriented x3.   . Cardiovascular: Regular rate and rhythm no rubs or gallops.   [name] Kitchen Respiratory: Clear to auscultation no wheezes or rales.  Poor inspiratory effort.   . Abdomen: Soft nontender nondistended normal bowel sounds present Musculoskeletal: No lower extremity edema bilaterally. Psychiatry: Mood is appropriate for condition and  setting.  Data Reviewed: CBC: Recent Labs  Lab 01/08/19 0357 01/09/19 0507 01/10/19 0325 01/11/19 0510 01/12/19 0418  WBC 11.1* 11.8* 10.6* 12.4* 14.9*  NEUTROABS 10.3* 10.9* 9.3* 10.7* 12.7*  HGB 9.5* 8.9* 9.9* 9.3* 9.1*  HCT 27.9* 27.0* 29.7* 27.7* 27.3*  MCV 89.1 90.0 88.7 87.9 89.8  PLT 346 348 364 362 [id number]   Basic Metabolic Panel: Recent Labs  Lab 01/07/19 0647 01/08/19 0357 01/09/19 0507 01/10/19 0325 01/11/19 0510 01/12/19 0418  NA 138 136 132* 132* 130* 129*  K 5.1 4.3 4.8 4.5 4.9 4.8  CL 101 96* 94* 92* 90* 89*  CO2 17* 22 18* 19* 20* 20*  GLUCOSE 237* 241* 380* 222* 346* 457*  BUN 124* 97* 135* 111* 148* 96*  CREATININE 11.81* 8.82* 10.02* 8.11* 10.03* 7.59*  CALCIUM 7.7* 7.9* 7.6* 7.5* 7.6* 7.8*  MG 2.3 2.2 2.3 2.1 2.4  --   PHOS 5.6* 6.0* 7.6* 7.3* 8.8*  --    GFR: Estimated Creatinine Clearance: 9.4 mL/min (A) (by C-G formula based on SCr of 7.59 mg/dL (H)). Liver Function Tests: Recent Labs  Lab 01/08/19 0357 01/09/19 0507 01/10/19 0325 01/11/19 0510 01/12/19 0418  AST 51* 37 33 24 22  ALT 35 31 29 23 21   ALKPHOS 85 102 101 89 84  BILITOT 0.8 0.7 0.7 0.9 0.7  PROT 6.8 6.3* 6.6 6.4* 6.1*  ALBUMIN 2.5* 2.4* 2.5* 2.2* 2.1*   No results for input(s): LIPASE, AMYLASE in the last 168 hours. No results for input(s): AMMONIA in the last 168 hours. Coagulation Profile: No results for input(s): INR, PROTIME in the last 168 hours. Cardiac Enzymes: No results for input(s): CKTOTAL, CKMB, CKMBINDEX, TROPONINI in the last 168 hours. BNP (last 3 results) No results for input(s): PROBNP in the last 8760 hours. HbA1C: No results for input(s): HGBA1C in the last 72 hours. CBG: Recent Labs  Lab 01/11/19 1152 01/11/19 1623 01/11/19 2102 01/12/19 0759 01/12/19 1156  GLUCAP 131* 286* 415* 367* 343*   Lipid Profile: No results for input(s): CHOL, HDL, LDLCALC, TRIG, CHOLHDL, LDLDIRECT in the last 72 hours. Thyroid Function Tests: No results for  input(s): TSH, T4TOTAL, FREET4, T3FREE, THYROIDAB in the last 72 hours. Anemia Panel: Recent Labs    01/11/19 0510 01/12/19 0418  FERRITIN 2,826* 2,899*   Urine analysis:    Component Value Date/Time   COLORURINE YELLOW 01/06/2019 1600   APPEARANCEUR CLEAR 01/06/2019 1600   LABSPEC 1.014 01/06/2019 1600   PHURINE 5.0 01/06/2019 1600   GLUCOSEU 150 (A) 01/06/2019 1600   HGBUR SMALL (A) 01/06/2019 1600   BILIRUBINUR NEGATIVE 01/06/2019 1600   KETONESUR NEGATIVE 01/06/2019 1600   PROTEINUR 100 (A) 01/06/2019 1600   NITRITE NEGATIVE 01/06/2019 1600   LEUKOCYTESUR NEGATIVE 01/06/2019 1600   Sepsis Labs: @LABRCNTIP (procalcitonin:4,lacticidven:4)  ) Recent Results (from the past 240 hour(s))  Culture, blood (routine x 2)     Status: None   Collection Time: 01/06/19  4:00 PM   Specimen: BLOOD  Result Value Ref Range Status   Specimen Description BLOOD RIGHT ANTECUBITAL  Final   Special Requests   Final    BOTTLES DRAWN AEROBIC AND ANAEROBIC Blood  Culture adequate volume   Culture   Final    NO GROWTH 5 DAYS Performed at [facility], [place] [address] [place], [place] [address]    Report Status 01/11/2019 FINAL  Final  Culture, blood (routine x 2)     Status: None   Collection Time: 01/06/19  4:01 PM   Specimen: BLOOD RIGHT WRIST  Result Value Ref Range Status   Specimen Description BLOOD RIGHT WRIST  Final   Special Requests   Final    BOTTLES DRAWN AEROBIC AND ANAEROBIC Blood Culture adequate volume   Culture   Final    NO GROWTH 5 DAYS Performed at [facility], [place] [address], [place], [place] [address]    Report Status 01/11/2019 FINAL  Final  Urine culture     Status: None   Collection Time: 01/06/19  4:10 PM   Specimen: Urine, Clean Catch  Result Value Ref Range Status   Specimen Description URINE, CLEAN CATCH  Final   Special Requests NONE  Final   Culture   Final    NO GROWTH Performed at [facility], [place] [address] [place], [place]  [address]    Report Status 01/07/2019 FINAL  Final  MRSA PCR Screening     Status: None   Collection Time: 01/08/19  5:53 PM   Specimen: Nasal Mucosa; Nasopharyngeal  Result Value Ref Range Status   MRSA by PCR NEGATIVE NEGATIVE Final    Comment:        The GeneXpert MRSA Assay (FDA approved for NASAL specimens only), is one component of a comprehensive MRSA colonization surveillance program. It is not intended to diagnose MRSA infection nor to guide or monitor treatment for MRSA infections. Performed at [facility], [place] [address], [place], [place] [address]       Studies: No results found.  Scheduled Meds: . albuterol  2 puff Inhalation Q8H  . aspirin EC  81 mg Oral Daily  . calcitRIOL  0.5 mcg Oral Q T,Th,Sa-HD  . Chlorhexidine Gluconate Cloth  6 each Topical Daily  . Chlorhexidine Gluconate Cloth  6 each Topical Q0600  . Chlorhexidine Gluconate Cloth  6 each Topical Q0600  . darbepoetin (ARANESP) injection - DIALYSIS  60 mcg Intravenous Q Thu-HD  . dexamethasone (DECADRON) injection  6 mg Intravenous Q24H  . docusate sodium  100 mg Oral BID  . heparin injection (subcutaneous)  7,500 Units Subcutaneous Q8H  . hydrALAZINE  25 mg Oral Q8H  . insulin aspart  0-15 Units Subcutaneous TID WC  . insulin aspart  0-5 Units Subcutaneous QHS  . insulin aspart  4 Units Subcutaneous TID WC  . insulin detemir  8 Units Subcutaneous BID  . ipratropium  2 puff Inhalation Q8H  . isosorbide mononitrate  30 mg Oral QHS  . metoprolol succinate  12.5 mg Oral QHS  . multivitamin  1 tablet Oral QHS  . rosuvastatin  40 mg Oral Daily  . sevelamer carbonate  2,400 mg Oral TID WC    Continuous Infusions: . sodium chloride    . sodium chloride       LOS: 6 days     Kayleen Memos, MD [facility] Pager [phone]  If 7PM-7AM, please contact night-coverage [email or web address] Password [email or web address] 01/12/2019, 2:28 PM

## 2019-01-13 LAB — COMPREHENSIVE METABOLIC PANEL WITH GFR
ALT: 23 U/L (ref 0–44)
AST: 21 U/L (ref 15–41)
Albumin: 2.2 g/dL — ABNORMAL LOW (ref 3.5–5.0)
Alkaline Phosphatase: 80 U/L (ref 38–126)
Anion gap: 23 — ABNORMAL HIGH (ref 5–15)
BUN: 126 mg/dL — ABNORMAL HIGH (ref 8–23)
CO2: 19 mmol/L — ABNORMAL LOW (ref 22–32)
Calcium: 7.6 mg/dL — ABNORMAL LOW (ref 8.9–10.3)
Chloride: 90 mmol/L — ABNORMAL LOW (ref 98–111)
Creatinine, Ser: 9.91 mg/dL — ABNORMAL HIGH (ref 0.61–1.24)
GFR calc Af Amer: 6 mL/min — ABNORMAL LOW
GFR calc non Af Amer: 5 mL/min — ABNORMAL LOW
Glucose, Bld: 240 mg/dL — ABNORMAL HIGH (ref 70–99)
Potassium: 4.5 mmol/L (ref 3.5–5.1)
Sodium: 132 mmol/L — ABNORMAL LOW (ref 135–145)
Total Bilirubin: 0.8 mg/dL (ref 0.3–1.2)
Total Protein: 6.3 g/dL — ABNORMAL LOW (ref 6.5–8.1)

## 2019-01-13 LAB — CBC WITH DIFFERENTIAL/PLATELET
Abs Immature Granulocytes: 1.18 10*3/uL — ABNORMAL HIGH (ref 0.00–0.07)
Basophils Absolute: 0.1 10*3/uL (ref 0.0–0.1)
Basophils Relative: 0 %
Eosinophils Absolute: 0.1 10*3/uL (ref 0.0–0.5)
Eosinophils Relative: 0 %
HCT: 27.6 % — ABNORMAL LOW (ref 39.0–52.0)
Hemoglobin: 9.1 g/dL — ABNORMAL LOW (ref 13.0–17.0)
Immature Granulocytes: 5 %
Lymphocytes Relative: 3 %
Lymphs Abs: 0.6 10*3/uL — ABNORMAL LOW (ref 0.7–4.0)
MCH: 29.9 pg (ref 26.0–34.0)
MCHC: 33 g/dL (ref 30.0–36.0)
MCV: 90.8 fL (ref 80.0–100.0)
Monocytes Absolute: 1.2 10*3/uL — ABNORMAL HIGH (ref 0.1–1.0)
Monocytes Relative: 5 %
Neutro Abs: 19.4 10*3/uL — ABNORMAL HIGH (ref 1.7–7.7)
Neutrophils Relative %: 87 %
Platelets: 390 10*3/uL (ref 150–400)
RBC: 3.04 MIL/uL — ABNORMAL LOW (ref 4.22–5.81)
RDW: 15.3 % (ref 11.5–15.5)
WBC: 22.5 10*3/uL — ABNORMAL HIGH (ref 4.0–10.5)
nRBC: 0.1 % (ref 0.0–0.2)

## 2019-01-13 LAB — FERRITIN: Ferritin: 2557 ng/mL — ABNORMAL HIGH (ref 24–336)

## 2019-01-13 LAB — GLUCOSE, CAPILLARY
Glucose-Capillary: 208 mg/dL — ABNORMAL HIGH (ref 70–99)
Glucose-Capillary: 308 mg/dL — ABNORMAL HIGH (ref 70–99)
Glucose-Capillary: 359 mg/dL — ABNORMAL HIGH (ref 70–99)

## 2019-01-13 LAB — D-DIMER, QUANTITATIVE: D-Dimer, Quant: 2.76 ug{FEU}/mL — ABNORMAL HIGH (ref 0.00–0.50)

## 2019-01-13 LAB — C-REACTIVE PROTEIN: CRP: 6.7 mg/dL — ABNORMAL HIGH (ref <1.0)

## 2019-01-13 LAB — PHOSPHORUS: Phosphorus: 10.1 mg/dL — ABNORMAL HIGH (ref 2.5–4.6)

## 2019-01-13 LAB — PROCALCITONIN: Procalcitonin: 0.67 ng/mL

## 2019-01-13 LAB — BRAIN NATRIURETIC PEPTIDE: B Natriuretic Peptide: 540.7 pg/mL — ABNORMAL HIGH (ref 0.0–100.0)

## 2019-01-13 MED ORDER — HEPARIN SODIUM (PORCINE) 5000 UNIT/ML IJ SOLN: 5000.0000 [IU] | Freq: Three times a day (TID) | INTRAMUSCULAR | Status: DC

## 2019-01-13 MED ORDER — ALBUTEROL SULFATE HFA 108 (90 BASE) MCG/ACT IN AERS
2.0000 | INHALATION_SPRAY | RESPIRATORY_TRACT | Status: DC | PRN
  Filled 2019-01-13: qty 6.7

## 2019-01-13 MED ORDER — INSULIN DETEMIR 100 UNIT/ML ~~LOC~~ SOLN
10.0000 [IU] | Freq: Two times a day (BID) | SUBCUTANEOUS | Status: DC
  Administered 2019-01-13: 10 [IU] via SUBCUTANEOUS
  Filled 2019-01-13 (×2): qty 0.1

## 2019-01-13 MED ORDER — DEXAMETHASONE 6 MG PO TABS: 6.0000 mg | ORAL_TABLET | Freq: Every day | ORAL | 0 refills | Status: AC

## 2019-01-13 MED ORDER — CHLORHEXIDINE GLUCONATE CLOTH 2 % EX PADS: 6.0000 | MEDICATED_PAD | Freq: Every day | CUTANEOUS | Status: DC

## 2019-01-13 MED ORDER — IPRATROPIUM BROMIDE HFA 17 MCG/ACT IN AERS
2.0000 | INHALATION_SPRAY | RESPIRATORY_TRACT | Status: DC | PRN
  Filled 2019-01-13: qty 12.9

## 2019-01-13 MED ORDER — HYDRALAZINE HCL 25 MG PO TABS: 25.0000 mg | ORAL_TABLET | Freq: Three times a day (TID) | ORAL | 0 refills | Status: DC

## 2019-01-13 MED ORDER — ALBUTEROL SULFATE HFA 108 (90 BASE) MCG/ACT IN AERS: 2.0000 | INHALATION_SPRAY | Freq: Two times a day (BID) | RESPIRATORY_TRACT | 0 refills | Status: DC | PRN

## 2019-01-13 MED ORDER — DEXAMETHASONE 4 MG PO TABS: 6.0000 mg | ORAL_TABLET | Freq: Every day | ORAL | Status: DC

## 2019-01-13 MED ORDER — INSULIN ASPART 100 UNIT/ML ~~LOC~~ SOLN
7.0000 [IU] | Freq: Three times a day (TID) | SUBCUTANEOUS | Status: DC
  Administered 2019-01-13 (×3): 7 [IU] via SUBCUTANEOUS

## 2019-01-13 MED ORDER — METOPROLOL SUCCINATE ER 25 MG PO TB24: 12.5000 mg | ORAL_TABLET | Freq: Every day | ORAL | 0 refills | Status: DC

## 2019-01-13 NOTE — Discharge Instructions (Signed)
10 Things You Can Do to Manage Your COVID-19 Symptoms at Home If you have possible or confirmed COVID-19: 1. Stay home from work and school. And stay away from other public places. If you must go out, avoid using any kind of public transportation, ridesharing, or taxis. 2. Monitor your symptoms carefully. If your symptoms get worse, call your healthcare provider immediately. 3. Get rest and stay hydrated. 4. If you have a medical appointment, call the healthcare provider ahead of time and tell them that you have or may have COVID-19. 5. For medical emergencies, call 911 and notify the dispatch personnel that you have or may have COVID-19. 6. Cover your cough and sneezes with a tissue or use the inside of your elbow. 7. Wash your hands often with soap and water for at least 20 seconds or clean your hands with an alcohol-based hand sanitizer that contains at least 60% alcohol. 8. As much as possible, stay in a specific room and away from other people in your home. Also, you should use a separate bathroom, if available. If you need to be around other people in or outside of the home, wear a mask. 9. Avoid sharing personal items with other people in your household, like dishes, towels, and bedding. 10. Clean all surfaces that are touched often, like counters, tabletops, and doorknobs. Use household cleaning sprays or wipes according to the label instructions. [email or web address] 07/03/2018 This information is not intended to replace advice given to you by your health care provider. Make sure you discuss any questions you have with your health care provider. Document Revised: 12/05/2018 Document Reviewed: 12/05/2018 Elsevier Patient Education  [place].   Acute Respiratory Failure, Adult  Acute respiratory failure occurs when there is not enough oxygen passing from your lungs to your body. When this happens, your lungs have trouble removing carbon dioxide from the blood. This causes your  blood oxygen level to drop too low as carbon dioxide builds up. Acute respiratory failure is a medical emergency. It can develop quickly, but it is temporary if treated promptly. Your lung capacity, or how much air your lungs can hold, may improve with time, exercise, and treatment. What are the causes? There are many possible causes of acute respiratory failure, including:  Lung injury.  Chest injury or damage to the ribs or tissues near the lungs.  Lung conditions that affect the flow of air and blood into and out of the lungs, such as pneumonia, acute respiratory distress syndrome, and cystic fibrosis.  Medical conditions, such as strokes or spinal cord injuries, that affect the muscles and nerves that control breathing.  Blood infection (sepsis).  Inflammation of the pancreas (pancreatitis).  A blood clot in the lungs (pulmonary embolism).  A large-volume blood transfusion.  Burns.  Near-drowning.  Seizure.  Smoke inhalation.  Reaction to medicines.  Alcohol or drug overdose. What increases the risk? This condition is more likely to develop in people who have:  A blocked airway.  Asthma.  A condition or disease that damages or weakens the muscles, nerves, bones, or tissues that are involved in breathing.  A serious infection.  A health problem that blocks the unconscious reflex that is involved in breathing, such as hypothyroidism or sleep apnea.  A lung injury or trauma. What are the signs or symptoms? Trouble breathing is the main symptom of acute respiratory failure. Symptoms may also include:  Rapid breathing.  Restlessness or anxiety.  Skin, lips, or fingernails that appear blue (cyanosis).  Rapid  heart rate.  Abnormal heart rhythms (arrhythmias).  Confusion or changes in behavior.  Tiredness or loss of energy.  Feeling sleepy or having a loss of consciousness. How is this diagnosed? Your health care provider can diagnose acute respiratory  failure with a medical history and physical exam. During the exam, your health care provider will listen to your heart and check for crackling or wheezing sounds in your lungs. Your may also have tests to confirm the diagnosis and determine what is causing respiratory failure. These tests may include:  Measuring the amount of oxygen in your blood (pulse oximetry). The measurement comes from a small device that is placed on your finger, earlobe, or toe.  Other blood tests to measure blood gases and to look for signs of infection.  Sampling your cerebral spinal fluid or tracheal fluid to check for infections.  Chest X-ray to look for fluid in spaces that should be filled with air.  Electrocardiogram (ECG) to look at the heart's electrical activity. How is this treated? Treatment for this condition usually takes places in a hospital intensive care unit (ICU). Treatment depends on what is causing the condition. It may include one or more treatments until your symptoms improve. Treatment may include:  Supplemental oxygen. Extra oxygen is given through a tube in the nose, a face mask, or a hood.  A device such as a continuous positive airway pressure (CPAP) or bi-level positive airway pressure (BiPAP or BPAP) machine. This treatment uses mild air pressure to keep the airways open. A mask or other device will be placed over your nose or mouth. A tube that is connected to a motor will deliver oxygen through the mask.  Ventilator. This treatment helps move air into and out of the lungs. This may be done with a bag and mask or a machine. For this treatment, a tube is placed in your windpipe (trachea) so air and oxygen can flow to the lungs.  Extracorporeal membrane oxygenation (ECMO). This treatment temporarily takes over the function of the heart and lungs, supplying oxygen and removing carbon dioxide. ECMO gives the lungs a chance to recover. It may be used if a ventilator is not  effective.  Tracheostomy. This is a procedure that creates a hole in the neck to insert a breathing tube.  Receiving fluids and medicines.  Rocking the bed to help breathing. Follow these instructions at home:  Take over-the-counter and prescription medicines only as told by your health care provider.  Return to normal activities as told by your health care provider. Ask your health care provider what activities are safe for you.  Keep all follow-up visits as told by your health care provider. This is important. How is this prevented? Treating infections and medical conditions that may lead to acute respiratory failure can help prevent the condition from developing. Contact a health care provider if:  You have a fever.  Your symptoms do not improve or they get worse. Get help right away if:  You are having trouble breathing.  You lose consciousness.  Your have cyanosis or turn blue.  You develop a rapid heart rate.  You are confused. These symptoms may represent a serious problem that is an emergency. Do not wait to see if the symptoms will go away. Get medical help right away. Call your local emergency services (911 in the [place] Do not drive yourself to the hospital. This information is not intended to replace advice given to you by your health care provider. Make sure  you discuss any questions you have with your health care provider. Document Revised: 12/01/2016 Document Reviewed: 07/07/2015 Elsevier Patient Education  2020 [employer].   COVID-19 Frequently Asked Questions COVID-19 (coronavirus disease) is an infection that is caused by a large family of viruses. Some viruses cause illness in people and others cause illness in animals like camels, cats, and bats. In some cases, the viruses that cause illness in animals can spread to humans. Where did the coronavirus come from? In December 2019, [place] told the [facility]) of several cases of lung  disease (human respiratory illness). These cases were linked to an open seafood and livestock market in the city of [place]. The link to the seafood and livestock market suggests that the virus may have spread from animals to humans. However, since that first outbreak in December, the virus has also been shown to spread from person to person. What is the name of the disease and the virus? Disease name Early on, this disease was called novel coronavirus. This is because scientists determined that the disease was caused by a new (novel) respiratory virus. The World Health Organization [facility] has now named the disease COVID-19, or coronavirus disease. Virus name The virus that causes the disease is called severe acute respiratory syndrome coronavirus 2 (SARS-CoV-2). More information on disease and virus naming World Health Organization [facility]): www.who.int/emergencies/diseases/novel-coronavirus-2019/technical-guidance/naming-the-coronavirus-disease-(covid-2019)-and-the-virus-that-causes-it Who is at risk for complications from coronavirus disease? Some people may be at higher risk for complications from coronavirus disease. This includes older adults and people who have chronic diseases, such as heart disease, diabetes, and lung disease. If you are at higher risk for complications, take these extra precautions:  Stay home as much as possible.  Avoid social gatherings and travel.  Avoid close contact with others. Stay at least 6 ft (2 m) away from others, if possible.  Wash your hands often with soap and water for at least 20 seconds.  Avoid touching your face, mouth, nose, or eyes.  Keep supplies on hand at home, such as food, medicine, and cleaning supplies.  If you must go out in public, wear a cloth face covering or face mask. Make sure your mask covers your nose and mouth. How does coronavirus disease spread? The virus that causes coronavirus disease spreads easily from person to person (is  contagious). You may catch the virus by:  Breathing in droplets from an infected person. Droplets can be spread by a person breathing, speaking, singing, coughing, or sneezing.  Touching something, like a table or a doorknob, that was exposed to the virus (contaminated) and then touching your mouth, nose, or eyes. Can I get the virus from touching surfaces or objects? There is still a lot that we do not know about the virus that causes coronavirus disease. Scientists are basing a lot of information on what they know about similar viruses, such as:  Viruses cannot generally survive on surfaces for long. They need a human body (host) to survive.  It is more likely that the virus is spread by close contact with people who are sick (direct contact), such as through: ? Shaking hands or hugging. ? Breathing in respiratory droplets that travel through the air. Droplets can be spread by a person breathing, speaking, singing, coughing, or sneezing.  It is less likely that the virus is spread when a person touches a surface or object that has the virus on it (indirect contact). The virus may be able to enter the body if the person touches  a surface or object and then touches his or her face, eyes, nose, or mouth. Can a person spread the virus without having symptoms of the disease? It may be possible for the virus to spread before a person has symptoms of the disease, but this is most likely not the main way the virus is spreading. It is more likely for the virus to spread by being in close contact with people who are sick and breathing in the respiratory droplets spread by a person breathing, speaking, singing, coughing, or sneezing. What are the symptoms of coronavirus disease? Symptoms vary from person to person and can range from mild to severe. Symptoms may include:  Fever or chills.  Cough.  Difficulty breathing or feeling short of breath.  Headaches, body aches, or muscle aches.  Runny or  stuffy (congested) nose.  Sore throat.  New loss of taste or smell.  Nausea, vomiting, or diarrhea. These symptoms can appear anywhere from 2 to 14 days after you have been exposed to the virus. Some people may not have any symptoms. If you develop symptoms, call your health care provider. People with severe symptoms may need hospital care. Should I be tested for this virus? Your health care provider will decide whether to test you based on your symptoms, history of exposure, and your risk factors. How does a health care provider test for this virus? Health care providers will collect samples to send for testing. Samples may include:  Taking a swab of fluid from the back of your nose and throat, your nose, or your throat.  Taking fluid from the lungs by having you cough up mucus (sputum) into a sterile cup.  Taking a blood sample. Is there a treatment or vaccine for this virus? Currently, there is no vaccine to prevent coronavirus disease. Also, there are no medicines like antibiotics or antivirals to treat the virus. A person who becomes sick is given supportive care, which means rest and fluids. A person may also relieve his or her symptoms by using over-the-counter medicines that treat sneezing, coughing, and runny nose. These are the same medicines that a person takes for the common cold. If you develop symptoms, call your health care provider. People with severe symptoms may need hospital care. What can I do to protect myself and my family from this virus?     You can protect yourself and your family by taking the same actions that you would take to prevent the spread of other viruses. Take the following actions:  Wash your hands often with soap and water for at least 20 seconds. If soap and water are not available, use alcohol-based hand sanitizer.  Avoid touching your face, mouth, nose, or eyes.  Cough or sneeze into a tissue, sleeve, or elbow. Do not cough or sneeze into your  hand or the air. ? If you cough or sneeze into a tissue, throw it away immediately and wash your hands.  Disinfect objects and surfaces that you frequently touch every day.  Stay away from people who are sick.  Avoid going out in public, follow guidance from your state and local health authorities.  Avoid crowded indoor spaces. Stay at least 6 ft (2 m) away from others.  If you must go out in public, wear a cloth face covering or face mask. Make sure your mask covers your nose and mouth.  Stay home if you are sick, except to get medical care. Call your health care provider before you get medical care.  Your health care provider will tell you how long to stay home.  Make sure your vaccines are up to date. Ask your health care provider what vaccines you need. What should I do if I need to travel? Follow travel recommendations from your local health authority, the CDC, and WHO. Travel information and advice  Centers for Disease Control and Prevention (CDC): BodyEditor.hu  World Health Organization [facility]): ThirdIncome.ca Know the risks and take action to protect your health  You are at higher risk of getting coronavirus disease if you are traveling to areas with an outbreak or if you are exposed to travelers from areas with an outbreak.  Wash your hands often and practice good hygiene to lower the risk of catching or spreading the virus. What should I do if I am sick? General instructions to stop the spread of infection  Wash your hands often with soap and water for at least 20 seconds. If soap and water are not available, use alcohol-based hand sanitizer.  Cough or sneeze into a tissue, sleeve, or elbow. Do not cough or sneeze into your hand or the air.  If you cough or sneeze into a tissue, throw it away immediately and wash your hands.  Stay home unless you must get medical care. Call  your health care provider or local health authority before you get medical care.  Avoid public areas. Do not take public transportation, if possible.  If you can, wear a mask if you must go out of the house or if you are in close contact with someone who is not sick. Make sure your mask covers your nose and mouth. Keep your home clean  Disinfect objects and surfaces that are frequently touched every day. This may include: ? Counters and tables. ? Doorknobs and light switches. ? Sinks and faucets. ? Electronics such as phones, remote controls, keyboards, computers, and tablets.  Wash dishes in hot, soapy water or use a dishwasher. Air-dry your dishes.  Wash laundry in hot water. Prevent infecting other household members  Let healthy household members care for children and pets, if possible. If you have to care for children or pets, wash your hands often and wear a mask.  Sleep in a different bedroom or bed, if possible.  Do not share personal items, such as razors, toothbrushes, deodorant, combs, brushes, towels, and washcloths. Where to find more information Centers for Disease Control and Prevention (CDC)  Information and news updates: https://www.butler-gonzalez.com/ World Health Organization [facility])  Information and news updates: MissExecutive.com.ee  Coronavirus health topic: https://www.castaneda.info/  Questions and answers on COVID-19: OpportunityDebt.at  Global tracker: who.sprinklr.com American Academy of Pediatrics (AAP)  Information for families: www.healthychildren.org/English/health-issues/conditions/chest-lungs/Pages/2019-Novel-Coronavirus.aspx The coronavirus situation is changing rapidly. Check your local health authority website or the CDC and [facility] websites for updates and news. When should I contact a health care provider?  Contact your health care provider if you have symptoms of an  infection, such as fever or cough, and you: ? Have been near anyone who is known to have coronavirus disease. ? Have come into contact with a person who is suspected to have coronavirus disease. ? Have traveled to an area where there is an outbreak of COVID-19. When should I get emergency medical care?  Get help right away by calling your local emergency services (911 in the [place] if you have: ? Trouble breathing. ? Pain or pressure in your chest. ? Confusion. ? Blue-tinged lips and fingernails. ? Difficulty waking from sleep. ? Symptoms that get worse.  Let the emergency medical personnel know if you think you have coronavirus disease. Summary  A new respiratory virus is spreading from person to person and causing COVID-19 (coronavirus disease).  The virus that causes COVID-19 appears to spread easily. It spreads from one person to another through droplets from breathing, speaking, singing, coughing, or sneezing.  Older adults and those with chronic diseases are at higher risk of disease. If you are at higher risk for complications, take extra precautions.  There is currently no vaccine to prevent coronavirus disease. There are no medicines, such as antibiotics or antivirals, to treat the virus.  You can protect yourself and your family by washing your hands often, avoiding touching your face, and covering your coughs and sneezes. This information is not intended to replace advice given to you by your health care provider. Make sure you discuss any questions you have with your health care provider. Document Revised: 10/18/2018 Document Reviewed: 04/16/2018 Elsevier Patient Education  2020 [place].   COVID-19 COVID-19 is a respiratory infection that is caused by a virus called severe acute respiratory syndrome coronavirus 2 (SARS-CoV-2). The disease is also known as coronavirus disease or novel coronavirus. In some people, the virus may not cause any symptoms. In others, it may  cause a serious infection. The infection can get worse quickly and can lead to complications, such as:  Pneumonia, or infection of the lungs.  Acute respiratory distress syndrome or ARDS. This is a condition in which fluid build-up in the lungs prevents the lungs from filling with air and passing oxygen into the blood.  Acute respiratory failure. This is a condition in which there is not enough oxygen passing from the lungs to the body or when carbon dioxide is not passing from the lungs out of the body.  Sepsis or septic shock. This is a serious bodily reaction to an infection.  Blood clotting problems.  Secondary infections due to bacteria or fungus.  Organ failure. This is when your body's organs stop working. The virus that causes COVID-19 is contagious. This means that it can spread from person to person through droplets from coughs and sneezes (respiratory secretions). What are the causes? This illness is caused by a virus. You may catch the virus by:  Breathing in droplets from an infected person. Droplets can be spread by a person breathing, speaking, singing, coughing, or sneezing.  Touching something, like a table or a doorknob, that was exposed to the virus (contaminated) and then touching your mouth, nose, or eyes. What increases the risk? Risk for infection You are more likely to be infected with this virus if you:  Are within 6 feet (2 meters) of a person with COVID-19.  Provide care for or live with a person who is infected with COVID-19.  Spend time in crowded indoor spaces or live in shared housing. Risk for serious illness You are more likely to become seriously ill from the virus if you:  Are 46 years of age or older. The higher your age, the more you are at risk for serious illness.  Live in a nursing home or long-term care facility.  Have cancer.  Have a long-term (chronic) disease such as: ? Chronic lung disease, including chronic obstructive pulmonary  disease or asthma. ? A long-term disease that lowers your body's ability to fight infection (immunocompromised). ? Heart disease, including heart failure, a condition in which the arteries that lead to the heart become narrow or blocked (coronary artery disease), a disease which  makes the heart muscle thick, weak, or stiff (cardiomyopathy). ? Diabetes. ? Chronic kidney disease. ? Sickle cell disease, a condition in which red blood cells have an abnormal "sickle" shape. ? Liver disease.  Are obese. What are the signs or symptoms? Symptoms of this condition can range from mild to severe. Symptoms may appear any time from 2 to 14 days after being exposed to the virus. They include:  A fever or chills.  A cough.  Difficulty breathing.  Headaches, body aches, or muscle aches.  Runny or stuffy (congested) nose.  A sore throat.  New loss of taste or smell. Some people may also have stomach problems, such as nausea, vomiting, or diarrhea. Other people may not have any symptoms of COVID-19. How is this diagnosed? This condition may be diagnosed based on:  Your signs and symptoms, especially if: ? You live in an area with a COVID-19 outbreak. ? You recently traveled to or from an area where the virus is common. ? You provide care for or live with a person who was diagnosed with COVID-19. ? You were exposed to a person who was diagnosed with COVID-19.  A physical exam.  Lab tests, which may include: ? Taking a sample of fluid from the back of your nose and throat (nasopharyngeal fluid), your nose, or your throat using a swab. ? A sample of mucus from your lungs (sputum). ? Blood tests.  Imaging tests, which may include, X-rays, CT scan, or ultrasound. How is this treated? At present, there is no medicine to treat COVID-19. Medicines that treat other diseases are being used on a trial basis to see if they are effective against COVID-19. Your health care provider will talk with you  about ways to treat your symptoms. For most people, the infection is mild and can be managed at home with rest, fluids, and over-the-counter medicines. Treatment for a serious infection usually takes places in a hospital intensive care unit (ICU). It may include one or more of the following treatments. These treatments are given until your symptoms improve.  Receiving fluids and medicines through an IV.  Supplemental oxygen. Extra oxygen is given through a tube in the nose, a face mask, or a hood.  Positioning you to lie on your stomach (prone position). This makes it easier for oxygen to get into the lungs.  Continuous positive airway pressure (CPAP) or bi-level positive airway pressure (BPAP) machine. This treatment uses mild air pressure to keep the airways open. A tube that is connected to a motor delivers oxygen to the body.  Ventilator. This treatment moves air into and out of the lungs by using a tube that is placed in your windpipe.  Tracheostomy. This is a procedure to create a hole in the neck so that a breathing tube can be inserted.  Extracorporeal membrane oxygenation (ECMO). This procedure gives the lungs a chance to recover by taking over the functions of the heart and lungs. It supplies oxygen to the body and removes carbon dioxide. Follow these instructions at home: Lifestyle  If you are sick, stay home except to get medical care. Your health care provider will tell you how long to stay home. Call your health care provider before you go for medical care.  Rest at home as told by your health care provider.  Do not use any products that contain nicotine or tobacco, such as cigarettes, e-cigarettes, and chewing tobacco. If you need help quitting, ask your health care provider.  Return to your  normal activities as told by your health care provider. Ask your health care provider what activities are safe for you. General instructions  Take over-the-counter and prescription  medicines only as told by your health care provider.  Drink enough fluid to keep your urine pale yellow.  Keep all follow-up visits as told by your health care provider. This is important. How is this prevented?  There is no vaccine to help prevent COVID-19 infection. However, there are steps you can take to protect yourself and others from this virus. To protect yourself:   Do not travel to areas where COVID-19 is a risk. The areas where COVID-19 is reported change often. To identify high-risk areas and travel restrictions, check the CDC travel website: FatFares.com.br  If you live in, or must travel to, an area where COVID-19 is a risk, take precautions to avoid infection. ? Stay away from people who are sick. ? Wash your hands often with soap and water for 20 seconds. If soap and water are not available, use an alcohol-based hand sanitizer. ? Avoid touching your mouth, face, eyes, or nose. ? Avoid going out in public, follow guidance from your state and local health authorities. ? If you must go out in public, wear a cloth face covering or face mask. Make sure your mask covers your nose and mouth. ? Avoid crowded indoor spaces. Stay at least 6 feet (2 meters) away from others. ? Disinfect objects and surfaces that are frequently touched every day. This may include:  Counters and tables.  Doorknobs and light switches.  Sinks and faucets.  Electronics, such as phones, remote controls, keyboards, computers, and tablets. To protect others: If you have symptoms of COVID-19, take steps to prevent the virus from spreading to others.  If you think you have a COVID-19 infection, contact your health care provider right away. Tell your health care team that you think you may have a COVID-19 infection.  Stay home. Leave your house only to seek medical care. Do not use public transport.  Do not travel while you are sick.  Wash your hands often with soap and water for 20  seconds. If soap and water are not available, use alcohol-based hand sanitizer.  Stay away from other members of your household. Let healthy household members care for children and pets, if possible. If you have to care for children or pets, wash your hands often and wear a mask. If possible, stay in your own room, separate from others. Use a different bathroom.  Make sure that all people in your household wash their hands well and often.  Cough or sneeze into a tissue or your sleeve or elbow. Do not cough or sneeze into your hand or into the air.  Wear a cloth face covering or face mask. Make sure your mask covers your nose and mouth. Where to find more information  Centers for Disease Control and Prevention: PurpleGadgets.be  World Health Organization: https://www.castaneda.info/ Contact a health care provider if:  You live in or have traveled to an area where COVID-19 is a risk and you have symptoms of the infection.  You have had contact with someone who has COVID-19 and you have symptoms of the infection. Get help right away if:  You have trouble breathing.  You have pain or pressure in your chest.  You have confusion.  You have bluish lips and fingernails.  You have difficulty waking from sleep.  You have symptoms that get worse. These symptoms may represent a serious  problem that is an emergency. Do not wait to see if the symptoms will go away. Get medical help right away. Call your local emergency services (911 in the [place] Do not drive yourself to the hospital. Let the emergency medical personnel know if you think you have COVID-19. Summary  COVID-19 is a respiratory infection that is caused by a virus. It is also known as coronavirus disease or novel coronavirus. It can cause serious infections, such as pneumonia, acute respiratory distress syndrome, acute respiratory failure, or sepsis.  The virus that causes COVID-19 is  contagious. This means that it can spread from person to person through droplets from breathing, speaking, singing, coughing, or sneezing.  You are more likely to develop a serious illness if you are 34 years of age or older, have a weak immune system, live in a nursing home, or have chronic disease.  There is no medicine to treat COVID-19. Your health care provider will talk with you about ways to treat your symptoms.  Take steps to protect yourself and others from infection. Wash your hands often and disinfect objects and surfaces that are frequently touched every day. Stay away from people who are sick and wear a mask if you are sick. This information is not intended to replace advice given to you by your health care provider. Make sure you discuss any questions you have with your health care provider. Document Revised: 10/18/2018 Document Reviewed: 01/24/2018 Elsevier Patient Education  2020 [place].  COVID-19: How to Protect Yourself and Others Know how it spreads  There is currently no vaccine to prevent coronavirus disease 2019 (COVID-19).  The best way to prevent illness is to avoid being exposed to this virus.  The virus is thought to spread mainly from person-to-person. ? Between people who are in close contact with one another (within about 6 feet). ? Through respiratory droplets produced when an infected person coughs, sneezes or talks. ? These droplets can land in the mouths or noses of people who are nearby or possibly be inhaled into the lungs. ? COVID-19 may be spread by people who are not showing symptoms. Everyone should Clean your hands often  Wash your hands often with soap and water for at least 20 seconds especially after you have been in a public place, or after blowing your nose, coughing, or sneezing.  If soap and water are not readily available, use a hand sanitizer that contains at least 60% alcohol. Cover all surfaces of your hands and rub them together  until they feel dry.  Avoid touching your eyes, nose, and mouth with unwashed hands. Avoid close contact  Limit contact with others as much as possible.  Avoid close contact with people who are sick.  Put distance between yourself and other people. ? Remember that some people without symptoms may be able to spread virus. ? This is especially important for people who are at higher risk of getting very GainPain.com.cy Cover your mouth and nose with a mask when around others  You could spread COVID-19 to others even if you do not feel sick.  Everyone should wear a mask in public settings and when around people not living in their household, especially when social distancing is difficult to maintain. ? Masks should not be placed on young children under age 4, anyone who has trouble breathing, or is unconscious, incapacitated or otherwise unable to remove the mask without assistance.  The mask is meant to protect other people in case you are infected.  Do NOT use a facemask meant for a Dietitian.  Continue to keep about 6 feet between yourself and others. The mask is not a substitute for social distancing. Cover coughs and sneezes  Always cover your mouth and nose with a tissue when you cough or sneeze or use the inside of your elbow.  Throw used tissues in the trash.  Immediately wash your hands with soap and water for at least 20 seconds. If soap and water are not readily available, clean your hands with a hand sanitizer that contains at least 60% alcohol. Clean and disinfect  Clean AND disinfect frequently touched surfaces daily. This includes tables, doorknobs, light switches, countertops, handles, desks, phones, keyboards, toilets, faucets, and sinks. RackRewards.fr  If surfaces are dirty, clean them: Use detergent or soap and water  prior to disinfection.  Then, use a household disinfectant. You can see a list of EPA-registered household disinfectants here. michellinders.com 09/04/2018 This information is not intended to replace advice given to you by your health care provider. Make sure you discuss any questions you have with your health care provider. Document Revised: 09/12/2018 Document Reviewed: 07/11/2018 Elsevier Patient Education  [place].  Hemodialysis Hemodialysis is a way of taking wastes, salt (sodium), and extra water out of the blood. It also helps keep healthy mineral levels in the blood. It is done when your kidneys no longer work right and cannot keep your blood clean. During this procedure, your blood flows out of your body through a tube to a machine (dialyzer). A filter in the machine cleans the blood, and the blood goes back to your body through a tube. This procedure is normally started when you have lost about 85-90% of your kidney function. It may be started sooner if it may help your symptoms. This is most often done at a hospital or clinic 3 times a week. Visits last 3-5 hours. With training, you may be able to do this at home on a different schedule. Tell your doctor about:  Any allergies you have.  All medicines you are taking, including vitamins, herbs, eye drops, creams, and over-the-counter medicines.  Any blood disorders you have.  Any surgeries you have had.  Any medical conditions you have. What are the risks? Generally, this is a safe procedure. However, problems may occur, including:  Low blood pressure.  A problem at the site where blood is taken away from the body and sent back to the body (vascular access site). This could be from an infection or a blockage.  Allergic reaction to chemicals used to clean the machine.  Narrowing or ballooning of a blood vessel, or bleeding. This may happen at the site where blood is taken from the body and sent back to the body.  This is rare. What happens before the procedure?        Medicines Ask your doctor about:  Changing or stopping your normal medicines. This is important.  Taking aspirin and ibuprofen. Do not take these medicines unless your doctor tells you to take them.  Taking over-the-counter medicines, vitamins, herbs, and supplements. General instructions  A surgery or procedure will be done to make an opening for the access site. This will be the site where blood is taken from the body and sent back to the body. This will be done before you have hemodialysis for the first time. There are three ways to create the access site: ? Arteriovenous fistula. Surgery is done to connect an artery and a vein.  This is often done in the arm. The access usually takes 1-6 months to form after surgery. ? Arteriovenous graft. A tube is used to connect an artery and a vein in the arm. The access can usually be used within 2-3 weeks of surgery. ? A venous catheter. A long, thin tube is placed in a large vein in your neck, chest, or groin. The access can be used right away.  Follow instructions from your doctor about what you cannot eat or drink.  Ask your doctor what steps will be taken to prevent the spread of germs. What happens during the procedure?   Your weight, blood pressure, pulse, and temperature will be measured.  Your access will be connected to the machine. ? If you have a fistula or graft, two needles will be placed through the access. They will be connected to a tube. The tube will be connected to the machine. The needles will be taped to your skin. This keeps them from moving. ? If you have a long, thin tube, it will be connected to a second tube. The second tube will then be connected to the machine.  The machine will be turned on. This will cause your blood to flow to the machine. The machine will clean your blood. Then your blood will go back to your body through a tube. Your blood pressure and  pulse will be checked a few times while the machine cleans your blood.  When your blood has been cleaned, you will be disconnected from the machine. If you have a fistula or graft, the needles will be taken out. A bandage (dressing) will then be put on the access. If you have a tube, the tube connected to the machine will be taken out. This procedure is done while you are sitting or reclining. You may sleep, read, or do other tasks that can be done in this position. If you have side effects, tell your doctor. The process can be changed for your comfort. The procedure may vary among doctors and hospitals. What happens after the procedure?  You will be monitored until you leave the hospital or clinic. This includes checking your blood pressure, heart rate, breathing rate, and blood oxygen level.  You will be weighed.  Your blood will be tested. The tests check that your treatments are taking out enough wastes. This is normally done once a month.  You may have side effects. These may include: ? Feeling dizzy. ? Muscle cramps. ? Feeling like you may vomit. ? Headaches. ? Allergic reaction. Where to find more information [place]: www.kidney.org Summary  Hemodialysis is a way of taking out wastes, salt, and extra water from your blood. It is done when your kidneys no longer work right and cannot keep your blood clean. This procedure is done 3 times a week, and visits last 3-5 hours.  Before the procedure, you will have a surgery to make an opening where blood will be taken out of the body and sent back to the body.  During the procedure, your blood flows out of your body through a tube to a machine. The machine cleans the blood and sends it back to your body. This information is not intended to replace advice given to you by your health care provider. Make sure you discuss any questions you have with your health care provider. Document Revised: 08/20/2018 Document Reviewed:  08/20/2018 Elsevier Patient Education  [place] Kidney Disease End-stage kidney disease occurs when  the kidneys are so damaged that they cannot function and cannot get better. This condition may also be referred to as end-stage renal disease or ESRD. The kidneys are two organs that do many important jobs in the body, including:  Removing wastes and extra fluids from the blood.  Making hormones that maintain the amount of fluid in your tissues and blood vessels.  Maintaining the right amount of fluids and chemicals in the body. Without functioning kidneys, toxins build up in the blood and life-threatening complications can occur. What are the causes? This condition usually occurs when a long-term (chronic) kidney disease gets worse and results in permanent damage to the kidneys. It may also be caused by sudden damage to the kidneys (acute kidney injury). Causes of this condition include:  Having a family history of chronic kidney disease (CKD).  Having chronic kidney disease for many years.  Chronic medical conditions that affect the kidneys, such as: ? Cardiovascular disease, including high blood pressure. ? Diabetes. ? Certain diseases that affect the body's disease-fighting (immune) system.  Overuse of over-the-counter pain medicines.  Being around or being in contact with poisonous (toxic) substances. What increases the risk? The following factors may make you more likely to develop this condition:  Being older than 60.  Being male.  Being of African-American, Asian, Native American, [place], or Hispanic descent.  Smoking or a history of smoking.  Obesity. What are the signs or symptoms? Symptoms of this condition include:  Swelling (edema) of the face, legs, ankles, or feet.  Numbness, tingling, or loss of feeling in the hands or feet.  Tiredness (lethargy).  Nausea or vomiting.  Confusion, trouble concentrating, or loss of  consciousness.  Chest pain.  Shortness of breath.  Passing little or no urine.  Muscle twitches and cramps, especially in the legs.  Dry, itchy skin.  Loss of appetite.  Pale skin due to anemia, including the skin and tissue around the eye (conjunctiva).  Headaches.  Abnormally dark or light skin.  Decrease in muscle size (muscle wasting).  Easy bruising.  Frequent hiccups.  Stopping of the monthly period in women.  Jerky movements (seizures). How is this diagnosed? This condition may be diagnosed based on:  A physical exam, including blood pressure measurements.  Urine tests.  Blood tests.  Imaging tests.  A test in which a sample of tissue is removed from the kidneys to be examined under a microscope (kidney biopsy). How is this treated? This condition may be treated with:  A procedure that removes toxic wastes from the body (dialysis). There are two types of dialysis: ? Dialysis that is done through your abdomen (peritoneal dialysis). This may be done several times a day. ? Dialysis that is done by a machine (hemodialysis). This may be done several times a week.  Surgery to receive a new kidney (kidney transplant). In addition to having dialysis or a kidney transplant, you may need to take medicines:  To control high blood pressure (hypertension).  To control high cholesterol.  To treat diabetes.  To maintain healthy levels of minerals in the blood (electrolytes). You may also be given a specific meal plan to follow that includes requirements or limits for:  Salt (sodium).  Protein.  Phosphorous.  Potassium.  Calcium. Follow these instructions at home: Medicines  Take over-the-counter and prescription medicines only as told by your health care provider.  Do not take any new medicines, vitamins, or mineral supplements unless approved by your health care provider. Many medicines  and supplements can worsen kidney damage.  Follow instructions  from your health care provider about adjusting the doses of any medicines you take. Lifestyle  Do not use any products that contain nicotine or tobacco, such as cigarettes and e-cigarettes. If you need help quitting, ask your health care provider.  Achieve and maintain a healthy weight. If you need help with this, ask your health care provider.  Start or continue an exercise plan. Exercise at least 30 minutes a day, 5 days a week.  Follow your prescribed meal plan. General instructions  Stay current with your shots (immunizations) as told by your health care provider.  Keep track of your blood pressure. Report changes in your blood pressure as told by your health care provider.  If you are being treated for diabetes, monitor and track your blood sugar (blood glucose) levels as told by your health care provider.  Keep all follow-up visits as told by your health care provider. This is important. Where to find more information  American Association of Kidney Patients: BombTimer.gl  National Kidney Foundation: www.kidney.[place]: https://mathis.com/  [facility]: www.lifeoptions.org and www.kidneyschool.org Contact a health care provider if:  Your symptoms get worse.  You develop new symptoms. Get help right away if:  You have weakness in an arm or leg on one side of your body.  You have difficulty speaking or you are slurring your speech.  You have a sudden change in your vision.  You have a sudden, severe headache.  You have a sudden weight increase.  You have difficulty breathing.  Your symptoms suddenly get worse. Summary  End-stage kidney disease occurs when the kidneys are so damaged that they cannot function and cannot get better.  Without functioning kidneys, toxins build up in the blood and life-threatening complications can occur.  Treatment may include dialysis or a kidney transplant along with medicines and lifestyle  changes. This information is not intended to replace advice given to you by your health care provider. Make sure you discuss any questions you have with your health care provider. Document Revised: 12/01/2016 Document Reviewed: 01/25/2016 Elsevier Patient Education  2020 [place].    Person Under Monitoring Name: Justin [name]  Location: [place] [id number]   CORONAVIRUS DISEASE 2019 (COVID-19) Guidance for Persons Under Investigation You are being tested for the virus that causes coronavirus disease 2019 (COVID-19). Public health actions are necessary to ensure protection of your health and the health of others, and to prevent further spread of infection. COVID-19 is caused by a virus that can cause symptoms, such as fever, cough, and shortness of breath. The primary transmission from person to person is by coughing or sneezing. On January 31, 2018, the [place] announced a [employer] Emergency of International Concern and on February 01, 2018 the U.S. Department of Health and Human Services declared a public health emergency. If the virus that causesCOVID-19 spreads in the community, it could have severe public health consequences.  As a person under investigation for COVID-19, the [place] advises you to adhere to the following guidance until your test results are reported to you. If your test result is positive, you will receive additional information from your provider and your local health department at that time.   Remain at home until you are cleared by your health provider or public health authorities.   Keep a log of visitors  to your home using the form provided. Any visitors to your home must be aware of your isolation status.  If you plan to move to a new address or leave the county, notify the local health department in your county.  Call a doctor or seek care if  you have an urgent medical need. Before seeking medical care, call ahead and get instructions from the provider before arriving at the medical office, clinic or hospital. Notify them that you are being tested for the virus that causes COVID-19 so arrangements can be made, as necessary, to prevent transmission to others in the healthcare setting. Next, notify the local health department in your county.  If a medical emergency arises and you need to call 911, inform the first responders that you are being tested for the virus that causes COVID-19. Next, notify the local health department in your county.  Adhere to all guidance set forth by the [place] for [facility] of patients that is based on guidance from the Center for Disease Control and Prevention with suspected or confirmed COVID-19. It is provided with this guidance for Persons Under Investigation.  Your health and the health of our community are our top priorities. Public Health officials remain available to provide assistance and counseling to you about COVID-19 and compliance with this guidance.  Provider: ____________________________________________________________ Date: ______/_____/_________  By signing below, you acknowledge that you have read and agree to comply with this Guidance for Persons Under Investigation. ______________________________________________________________ Date: ______/_____/_________  WHO DO I CALL? You can find a list of local health departments here: [email or web address] Health Department: ____________________________________________________________________ Contact Name: ________________________________________________________________________ Telephone: ___________________________________________________________________________  Marice Potter, [place], Communicable Disease Branch COVID-19 Guidance for  Persons Under Investigation March 09, 2018   Person Under Monitoring Name: Justin [name]  Location: [place]  [id number]   Infection Prevention Recommendations for Individuals Confirmed to have, or Being Evaluated for, 2019 Novel Coronavirus (COVID-19) Infection Who Receive Care at Home  Individuals who are confirmed to have, or are being evaluated for, COVID-19 should follow the prevention steps below until a healthcare provider or local or state health department says they can return to normal activities.  Stay home except to get medical care You should restrict activities outside your home, except for getting medical care. Do not go to work, school, or public areas, and do not use public transportation or taxis.  Call ahead before visiting your doctor Before your medical appointment, call the healthcare provider and tell them that you have, or are being evaluated for, COVID-19 infection. This will help the healthcare providers office take steps to keep other people from getting infected. Ask your healthcare provider to call the local or state health department.  Monitor your symptoms Seek prompt medical attention if your illness is worsening (e.g., difficulty breathing). Before going to your medical appointment, call the healthcare provider and tell them that you have, or are being evaluated for, COVID-19 infection. Ask your healthcare provider to call the local or state health department.  Wear a facemask You should wear a facemask that covers your nose and mouth when you are in the same room with other people and when you visit a healthcare provider. People who live with or visit you should also wear a facemask while they are in the same room with you.  Separate yourself from other people in your home As much as possible, you should stay in a different room from  other people in your home. Also, you should use a separate bathroom, if available.  Avoid  sharing household items You should not share dishes, drinking glasses, cups, eating utensils, towels, bedding, or other items with other people in your home. After using these items, you should wash them thoroughly with soap and water.  Cover your coughs and sneezes Cover your mouth and nose with a tissue when you cough or sneeze, or you can cough or sneeze into your sleeve. Throw used tissues in a lined trash can, and immediately wash your hands with soap and water for at least 20 seconds or use an alcohol-based hand rub.  Wash your [facility] your hands often and thoroughly with soap and water for at least 20 seconds. You can use an alcohol-based hand sanitizer if soap and water are not available and if your hands are not visibly dirty. Avoid touching your eyes, nose, and mouth with unwashed hands.   Prevention Steps for Caregivers and Household Members of Individuals Confirmed to have, or Being Evaluated for, COVID-19 Infection Being Cared for in the Home  If you live with, or provide care at home for, a person confirmed to have, or being evaluated for, COVID-19 infection please follow these guidelines to prevent infection:  Follow healthcare providers instructions Make sure that you understand and can help the patient follow any healthcare provider instructions for all care.  Provide for the patients basic needs You should help the patient with basic needs in the home and provide support for getting groceries, prescriptions, and other personal needs.  Monitor the patients symptoms If they are getting sicker, call his or her medical provider and tell them that the patient has, or is being evaluated for, COVID-19 infection. This will help the healthcare providers office take steps to keep other people from getting infected. Ask the healthcare provider to call the local or state health department.  Limit the number of people who have contact with the patient  If possible, have  only one caregiver for the patient.  Other household members should stay in another home or place of residence. If this is not possible, they should stay  in another room, or be separated from the patient as much as possible. Use a separate bathroom, if available.  Restrict visitors who do not have an essential need to be in the home.  Keep older adults, very young children, and other sick people away from the patient Keep older adults, very young children, and those who have compromised immune systems or chronic health conditions away from the patient. This includes people with chronic heart, lung, or kidney conditions, diabetes, and cancer.  Ensure good ventilation Make sure that shared spaces in the home have good air flow, such as from an air conditioner or an opened window, weather permitting.  Wash your hands often  Wash your hands often and thoroughly with soap and water for at least 20 seconds. You can use an alcohol based hand sanitizer if soap and water are not available and if your hands are not visibly dirty.  Avoid touching your eyes, nose, and mouth with unwashed hands.  Use disposable paper towels to dry your hands. If not available, use dedicated cloth towels and replace them when they become wet.  Wear a facemask and gloves  Wear a disposable facemask at all times in the room and gloves when you touch or have contact with the patients blood, body fluids, and/or secretions or excretions, such as sweat, saliva, sputum,  nasal mucus, vomit, urine, or feces.  Ensure the mask fits over your nose and mouth tightly, and do not touch it during use.  Throw out disposable facemasks and gloves after using them. Do not reuse.  Wash your hands immediately after removing your facemask and gloves.  If your personal clothing becomes contaminated, carefully remove clothing and launder. Wash your hands after handling contaminated clothing.  Place all used disposable facemasks, gloves,  and other waste in a lined container before disposing them with other household waste.  Remove gloves and wash your hands immediately after handling these items.  Do not share dishes, glasses, or other household items with the patient  Avoid sharing household items. You should not share dishes, drinking glasses, cups, eating utensils, towels, bedding, or other items with a patient who is confirmed to have, or being evaluated for, COVID-19 infection.  After the person uses these items, you should wash them thoroughly with soap and water.  Wash laundry thoroughly  Immediately remove and wash clothes or bedding that have blood, body fluids, and/or secretions or excretions, such as sweat, saliva, sputum, nasal mucus, vomit, urine, or feces, on them.  Wear gloves when handling laundry from the patient.  Read and follow directions on labels of laundry or clothing items and detergent. In general, wash and dry with the warmest temperatures recommended on the label.  Clean all areas the individual has used often  Clean all touchable surfaces, such as counters, tabletops, doorknobs, bathroom fixtures, toilets, phones, keyboards, tablets, and bedside tables, every day. Also, clean any surfaces that may have blood, body fluids, and/or secretions or excretions on them.  Wear gloves when cleaning surfaces the patient has come in contact with.  Use a diluted bleach solution (e.g., dilute bleach with 1 part bleach and 10 parts water) or a household disinfectant with a label that says EPA-registered for coronaviruses. To make a bleach solution at home, add 1 tablespoon of bleach to 1 quart (4 cups) of water. For a larger supply, add  cup of bleach to 1 gallon (16 cups) of water.  Read labels of cleaning products and follow recommendations provided on product labels. Labels contain instructions for safe and effective use of the cleaning product including precautions you should take when applying the  product, such as wearing gloves or eye protection and making sure you have good ventilation during use of the product.  Remove gloves and wash hands immediately after cleaning.  Monitor yourself for signs and symptoms of illness Caregivers and household members are considered close contacts, should monitor their health, and will be asked to limit movement outside of the home to the extent possible. Follow the monitoring steps for close contacts listed on the symptom monitoring form.   ? If you have additional questions, contact your local health department or call the epidemiologist on call at [phone] (available 24/7). ? This guidance is subject to change. For the most up-to-date guidance from [facility], please refer to their website: [email or web address]

## 2019-01-13 NOTE — Progress Notes (Signed)
[facility] called and notified of patient leaving hospital for d/c

## 2019-01-13 NOTE — Progress Notes (Signed)
Renal Navigator attempted to call patient in room to discuss OP HD schedule at [facility], but he did not answer. Navigator attempted to call number listed in chart, but there was no answer and no option to leave message.  Renal Navigator noted that patient had asked MD to contact his friend Tye Maryland, so Renal Navigator also called Tye Maryland, ER contact on chart, at [phone]. Navigator provided OP HD schedule to [place] and Tye Maryland will provide transportation. She states she wanted patient to discharge to her home, but reports that he is stubborn and will not agree. She states she understands that he is nearing discharge and is most concerned about his shortness of breath at this point. She states he gets short of breath when he talks to her for just a minute or two. Renal Navigator told her that per MD notes, patient will be evaluated for home O2 prior to discharge. Her concern was passed along to MD who also states that patient will be evaluated today prior to discharge. Discharge planned for today after evaluation. Patient to start at [facility] isolation shift tomorrow, TTS 12:00pm.  Alphonzo Cruise, [place] Renal Navigator [phone]

## 2019-01-13 NOTE — Progress Notes (Signed)
SATURATION QUALIFICATIONS: (This note is used to comply with regulatory documentation for home oxygen)  Patient Saturations on Room Air at Rest = 95%  Patient Saturations on Room Air while Ambulating 86%  Patient Saturations on 2 Liters of oxygen while Ambulating = 93%  Please briefly explain why patient needs home oxygen: Patient desats when ambulating without supplemental O2

## 2019-01-13 NOTE — Progress Notes (Signed)
[facility] NEPHROLOGY PROGRESS NOTE  Assessment/ Plan: Pt is a 69 y.o. yo male with DM, HTN, CHF, AAS, ESRD on HD, missed 3 HD treatment with COVID-19 PNA.  [facility]   4h 68min  85kg   3K/2.0Ca   Hep 6400  L RC AVF  - has had trouble with cannulating,  CKV APPT Pend, using perm cath  Calcitriol 0.5   mcg po /HD  Mircera 60  ( last on 12/20/18 )  Venofer 50 q wk  #Acute COVID-19 pneumonia with hypoxia: supplemental oxygen, s/p remdesivir and on decadron per primary team.  # Acute hypoxic resp failure  - 2/2 covid - optimize volume status with HD  - on supplemental oxygen with requirement improving   # ESRD  - HD per TTS schedule.  HD tomorrow if still here - using TDC and heparin with HD   - Needs 3 hour treatment - unable to shorten to 2.5 due to clearance.   - note he is below EDW - s/p UF with attempt to optimize resp status.  Will need to adjust on discharge based on his tolerance. Wt's now are 78 range, 7kg lower than prior dry wt.   # Anemia of CKD: Continue ESA.  IV iron off.    # Secondary hyperparathyroidism: Continue Renvela.  Low phosphorus diet. Update phos in AM. On calcitriol.   # HTN/volume: optimized volume with HD.  Add hydralazine at 25 mg TID for now.  Note on 37.5 mg TID at home but below EDW  # Hyponatremia corrects when adjusted for hyperglycemia  - DM per primary team.  note steroids  Kelly Splinter, MD 01/13/2019, 4:54 PM      Subjective:   Patient not examined today directly given COVID-19 + status, utilizing data taken from chart +/- discussions w/ providers and staff.     Objective Vital signs in last 24 hours: Vitals:   01/12/19 1805 01/12/19 2123 01/13/19 0824 01/13/19 1100  BP: (!) 161/55 (!) 189/62 (!) 126/51   Pulse: 66 62 63   Resp: 17  18   Temp: 98 F (36.7 C) 98.5 F (36.9 C)    TempSrc: Oral Oral    SpO2: 97% 98% 98% 95%  Weight:      Height:       Weight change: -1.5 kg No intake or output data in  the 24 hours ending 01/13/19 1456  Physical exam    Patient not examined today directly given COVID-19 + status, utilizing data taken from chart +/- discussions w/ providers and staff.   Medications: Infusions: . sodium chloride    . sodium chloride      Scheduled Medications: . aspirin EC  81 mg Oral Daily  . calcitRIOL  0.5 mcg Oral Q T,Th,Sa-HD  . Chlorhexidine Gluconate Cloth  6 each Topical Q0600  . darbepoetin (ARANESP) injection - DIALYSIS  60 mcg Intravenous Q Thu-HD  . [START ON 01/14/2019] dexamethasone  6 mg Oral Daily  . docusate sodium  100 mg Oral BID  . heparin injection (subcutaneous)  5,000 Units Subcutaneous Q8H  . hydrALAZINE  25 mg Oral Q8H  . insulin aspart  0-15 Units Subcutaneous TID WC  . insulin aspart  0-5 Units Subcutaneous QHS  . insulin aspart  7 Units Subcutaneous TID WC  . insulin detemir  10 Units Subcutaneous BID  . isosorbide mononitrate  30 mg Oral QHS  . metoprolol succinate  12.5 mg Oral QHS  . multivitamin  1 tablet  Oral QHS  . rosuvastatin  40 mg Oral Daily  . sevelamer carbonate  2,400 mg Oral TID WC    have reviewed scheduled and prn medications.

## 2019-01-13 NOTE — Discharge Summary (Signed)
Discharge Summary  Justin [name] [id number] DOB: [birth year]  PCP: Reynold Bowen, MD  Admit date: 01/06/2019 Discharge date: 01/13/2019  Time spent: 35 minutes  Recommendations for Outpatient Follow-up:  1. Follow up with your PCP 2. Follow up with nephrology 3. Continue hemodialysis as scheduled: Patient to start at [facility] isolation shift tomorrow, TTS 12:00pm. 4. Take your medications as prescribed 5. Fall precautions 6. Quarantine for 21 days from positive Covid-19 test on 01/06/2019 though 01/27/19.  Discharge Diagnoses:  Active Hospital Problems   Diagnosis Date Noted  . COVID-19 virus infection 01/06/2019  . COVID-19 01/06/2019    Resolved Hospital Problems  No resolved problems to display.    Discharge Condition: Stable   Diet recommendation: Renal dialysis carb diet                                        Home O2 evaluation 01/13/2019:  SATURATION QUALIFICATIONS: (This note is used to comply with regulatory documentation for home oxygen)  Patient Saturations on Room Air at Rest = 95%  Patient Saturations on Room Air while Ambulating 86%  Patient Saturations on 2 Liters of oxygen while Ambulating = 93%  Please briefly explain why patient needs home oxygen: Patient desats when ambulating without supplemental O2            Vitals:   01/13/19 0824 01/13/19 1100  BP: (!) 126/51   Pulse: 63   Resp: 18   Temp:    SpO2: 98% 95%    History of present illness:  Justin Jerkins Tuttleis a 69 y.o.malewith medical history significant ofESRD on HD, TTS, IDDM, HTN, HLD, presented with increasing short of breath for 1 week.  Associated with generalized weakness and productive cough.  O2 saturation 83% on room air in the ED.  Chest x-ray showed multifocal infiltrates.  COVID-19 screening test positive on 01/06/2019.  Started on COVID-19 directed therapies.  CTA PE negative for PE but showed bilateral ground glass opacities typical of covid-19 viral pneumonia.   Hospital course complicated by high oxygen requirement which improved with hemodialysis.  Now requiring 2L with ambulation to maintain O2 sat>92%.  Worked with PT no further recommendations. Completed 5 days of Remdisivir.  Will complete 10 days of decadron.  01/13/19: Seen and examined.  No acute events overnight.  No new complaints.  Vital signs and labs reviewed and are stable.  O2 saturation 95% on room air.  On the day of discharge, the patient was hemodynamically stable.  He will need to continue his hemodialysis treatments as scheduled and take his medications as prescribed.    Hospital Course:  Active Problems:   COVID-19 virus infection   COVID-19  Acute COVID-19 viral pneumonia superimposed by HCAP, POA Presented with shortness of breath, hypoxia with positive COVID-19 in-house test on 01/06/2019. Completed remdesivir. Will continue Decadron x10 days.  Initial procalcitonin elevated 1.53  BNP also significantly elevated greater than 2300 Volume status addressed by hemodialysis. Completed course of IV Vancomycin and IV Zosyn empirically. Inflammatory markers are trending down.  Acute hypoxic respiratory failure secondary to acute COVID-19 viral pneumonia, improving Not on oxygen supplementation at baseline PE and bilateral DVT ruled out. Oxygen requirement improved post hemodialysis Home O2 evaluation revealed will need 2 L oxygen nasal cannula for ambulation. Maintain O2 saturation greater than 92%  ESRD on HD TTS Hemodialysis per nephrology Keep hemodialysis appointments  Acute on chronic  systolic CHF Last 2D echo done on 04/25/2018 showed LVEF 35 to 40% with inferior septal hypokinesis Chest x-ray admission showing cardiomegaly with increase in pulmonary vascularity. Elevated BNP greater than 2300. Volume status addressed by hemodialysis  Resolved intermittent hiccups Post treatment.  Essential hypertension Continue hydralazine 25 mg 3 times daily, Imdur,  Toprol-XL 12.5 mg nightly. Follow with your PCP  Insulin-dependent type 2 diabetes Hemoglobin A1c 6.4 on 01/07/2019. Resume home insulin coverage Follow-up with your PCP   Code Status:Full code   Consults called:Nephrology    Discharge Exam: BP (!) 126/51 (BP Location: Right Arm)   Pulse 63   Temp 98.5 F (36.9 C) (Oral)   Resp 18   Ht 5\' 9"  (1.753 m)   Wt 77.4 kg   SpO2 95%   BMI 25.20 kg/m  . General: 69 y.o. year-old male well developed well nourished in no acute distress.  Alert and oriented x3. . Cardiovascular: Regular rate and rhythm with no rubs or gallops.  No thyromegaly or JVD noted.   [name] Kitchen Respiratory: Clear to auscultation with no wheezes or rales. Good inspiratory effort. . Abdomen: Soft nontender nondistended with normal bowel sounds x4 quadrants. . Musculoskeletal: No lower extremity edema. 2/4 pulses in all 4 extremities. [name] Kitchen Psychiatry: Mood is appropriate for condition and setting  Discharge Instructions You were cared for by a hospitalist during your hospital stay. If you have any questions about your discharge medications or the care you received while you were in the hospital after you are discharged, you can call the unit and asked to speak with the hospitalist on call if the hospitalist that took care of you is not available. Once you are discharged, your primary care physician will handle any further medical issues. Please note that NO REFILLS for any discharge medications will be authorized once you are discharged, as it is imperative that you return to your primary care physician (or establish a relationship with a primary care physician if you do not have one) for your aftercare needs so that they can reassess your need for medications and monitor your lab values.  Discharge Instructions    MyChart COVID-19 home monitoring program   Complete by: Jan 13, 2019    Is the patient willing to use the [place] for home monitoring?: Yes    Temperature monitoring   Complete by: Jan 13, 2019    After how many days would you like to receive a notification of this patient's flowsheet entries?: 1     Allergies as of 01/13/2019   No Known Allergies     Medication List    TAKE these medications   acetaminophen 325 MG tablet Commonly known as: TYLENOL Take 325 mg by mouth every 6 (six) hours as needed (pain).   albuterol 108 (90 Base) MCG/ACT inhaler Commonly known as: VENTOLIN HFA Inhale 2 puffs into the lungs 2 (two) times daily as needed for wheezing or shortness of breath.   aspirin 81 MG EC tablet Take 1 tablet (81 mg total) by mouth daily. What changed:   when to take this  additional instructions   dexamethasone 6 MG tablet Commonly known as: DECADRON Take 1 tablet (6 mg total) by mouth daily for 4 days.   hydrALAZINE 25 MG tablet Commonly known as: APRESOLINE Take 1 tablet (25 mg total) by mouth every 8 (eight) hours. What changed:   how much to take  when to take this   isosorbide mononitrate 30 MG 24 hr tablet Commonly  known as: IMDUR Take 1 tablet (30 mg total) by mouth daily. What changed:   when to take this  additional instructions   metoprolol succinate 25 MG 24 hr tablet Commonly known as: TOPROL-XL Take 0.5 tablets (12.5 mg total) by mouth at bedtime. What changed:   medication strength  how much to take  when to take this  additional instructions   NovoLIN 70/30 ReliOn (70-30) 100 UNIT/ML injection Generic drug: insulin NPH-regular Human Inject 20 Units into the skin daily. What changed:   when to take this  additional instructions   oxyCODONE 5 MG immediate release tablet Commonly known as: Roxicodone Take 1 tablet (5 mg total) by mouth every 6 (six) hours as needed. What changed: reasons to take this   rosuvastatin 40 MG tablet Commonly known as: CRESTOR Take 1 tablet (40 mg total) by mouth daily. What changed:   when to take this  additional instructions    sevelamer carbonate 800 MG tablet Commonly known as: RENVELA Take 2,400 mg by mouth 3 (three) times daily with meals.            Durable Medical Equipment  (From admission, onward)         Start     Ordered   01/13/19 1158  For home use only DME oxygen  Once    Question Answer Comment  Length of Need 6 Months   Mode or (Route) Nasal cannula   Liters per Minute 2   Frequency Continuous (stationary and portable oxygen unit needed)   Oxygen conserving device Yes   Oxygen delivery system Gas      01/13/19 1157         No Known Allergies Follow-up Information    Reynold Bowen, MD. Call in 1 day(s).   Specialty: Endocrinology Why: Please call for a post hospital follow up appointment. Contact information: [place] [address] [phone]        Lelon Perla, MD .   Specialty: Cardiology Contact information: [address] [place] [address] [phone]            The results of significant diagnostics from this hospitalization (including imaging, microbiology, ancillary and laboratory) are listed below for reference.    Significant Diagnostic Studies: CT ANGIO CHEST PE W OR WO CONTRAST  Result Date: 01/10/2019 CLINICAL DATA:  Hypoxemia.  COVID positive.  Elevated D-dimer. EXAM: CT ANGIOGRAPHY CHEST WITH CONTRAST TECHNIQUE: Multidetector CT imaging of the chest was performed using the standard protocol during bolus administration of intravenous contrast. Multiplanar CT image reconstructions and MIPs were obtained to evaluate the vascular anatomy. CONTRAST:  156mL OMNIPAQUE IOHEXOL 350 MG/ML SOLN COMPARISON:  None. FINDINGS: Cardiovascular: No filling defects in the pulmonary arteries to suggest pulmonary emboli. Heart is borderline in size. Diffusely calcified coronary arteries. Aortic atherosclerosis. No aneurysm. Small to medium size pericardial effusion. Mediastinum/Nodes: No mediastinal, hilar, or axillary adenopathy.  Trachea and esophagus are unremarkable. Thyroid unremarkable. Lungs/Pleura: Small left pleural effusion. Ground-glass airspace disease throughout both lungs most compatible with COVID pneumonia. Upper Abdomen: Imaging into the upper abdomen shows no acute findings. Musculoskeletal: Chest wall soft tissues are unremarkable. No acute bony abnormality. Review of the MIP images confirms the above findings. IMPRESSION: No evidence of pulmonary embolus. Extensive ground-glass airspace disease throughout the lungs most compatible with COVID pneumonia. Mild cardiomegaly.  Small to medium size pericardial effusion. Extensive coronary artery disease. Small left pleural effusion. Aortic Atherosclerosis (ICD10-I70.0). Electronically Signed   By: Lennette Bihari  [place] M.D.   On: 01/10/2019 13:05   DG Chest Portable 1 View  Result Date: 01/06/2019 CLINICAL DATA:  Short of breath. EXAM: PORTABLE CHEST 1 VIEW COMPARISON:  04/24/2018 FINDINGS: A new right jugular catheter terminates over the superior cavoatrial junction/high right atrium. The cardiac silhouette is moderately enlarged. There is mild central pulmonary vascular congestion without overt edema. No confluent airspace opacity, sizeable pleural effusion, or pneumothorax is identified. No acute osseous abnormality is seen. IMPRESSION: Cardiomegaly with mild pulmonary vascular congestion. Electronically Signed   By: Logan Bores M.D.   On: 01/06/2019 14:29   VAS [place] LOWER EXTREMITY VENOUS (DVT)  Result Date: 01/12/2019  Lower Venous Study Indications: Pulmonary embolism. Other Indications: COVID 19. Anticoagulation: Heparin. Performing Technologist: Darlina Sicilian RDCS  Examination Guidelines: A complete evaluation includes B-mode imaging, spectral Doppler, color Doppler, and power Doppler as needed of all accessible portions of each vessel. Bilateral testing is considered an integral part of a complete examination. Limited examinations for reoccurring indications may be  performed as noted.  +-------+---------------+---------+-----------+----------+--------------+ RIGHT  CompressibilityPhasicitySpontaneityPropertiesThrombus Aging +-------+---------------+---------+-----------+----------+--------------+ CFV    Full           Yes      Yes                                 +-------+---------------+---------+-----------+----------+--------------+ SFJ    Full                                                        +-------+---------------+---------+-----------+----------+--------------+ FV ProxFull                                                        +-------+---------------+---------+-----------+----------+--------------+ FV Mid Full                                                        +-------+---------------+---------+-----------+----------+--------------+ POP    Full           Yes      Yes                                 +-------+---------------+---------+-----------+----------+--------------+ PTV    Full                                                        +-------+---------------+---------+-----------+----------+--------------+ PERO   Full                                                        +-------+---------------+---------+-----------+----------+--------------+   +---------+---------------+---------+-----------+----------+--------------+ LEFT  CompressibilityPhasicitySpontaneityPropertiesThrombus Aging +---------+---------------+---------+-----------+----------+--------------+ CFV      Full           Yes      Yes                                 +---------+---------------+---------+-----------+----------+--------------+ SFJ      Full                                                        +---------+---------------+---------+-----------+----------+--------------+ FV Prox  Full                                                         +---------+---------------+---------+-----------+----------+--------------+ FV Mid   Full                                                        +---------+---------------+---------+-----------+----------+--------------+ FV DistalFull                                                        +---------+---------------+---------+-----------+----------+--------------+ POP      Full           Yes      Yes                                 +---------+---------------+---------+-----------+----------+--------------+ PTV      Full                                                        +---------+---------------+---------+-----------+----------+--------------+ PERO     Full                                                        +---------+---------------+---------+-----------+----------+--------------+    Summary: Right: There is no evidence of deep vein thrombosis in the lower extremity. No cystic structure found in the popliteal fossa. Left: There is no evidence of deep vein thrombosis in the lower extremity. No cystic structure found in the popliteal fossa.  *See table(s) above for measurements and observations. Electronically signed by Harold Barban MD on 01/12/2019 at 10:44:42 AM.    Final     Microbiology: Recent Results (from the past 240 hour(s))  Culture, blood (routine x 2)     Status: None   Collection Time: 01/06/19  4:00 PM   Specimen: BLOOD  Result Value Ref Range Status   Specimen Description  BLOOD RIGHT ANTECUBITAL  Final   Special Requests   Final    BOTTLES DRAWN AEROBIC AND ANAEROBIC Blood Culture adequate volume   Culture   Final    NO GROWTH 5 DAYS Performed at [facility], [address] [place], [place] [address]    Report Status 01/11/2019 FINAL  Final  Culture, blood (routine x 2)     Status: None   Collection Time: 01/06/19  4:01 PM   Specimen: BLOOD RIGHT WRIST  Result Value Ref Range Status   Specimen Description BLOOD RIGHT WRIST  Final    Special Requests   Final    BOTTLES DRAWN AEROBIC AND ANAEROBIC Blood Culture adequate volume   Culture   Final    NO GROWTH 5 DAYS Performed at [facility], [place] [address] [place], [place] [address]    Report Status 01/11/2019 FINAL  Final  Urine culture     Status: None   Collection Time: 01/06/19  4:10 PM   Specimen: Urine, Clean Catch  Result Value Ref Range Status   Specimen Description URINE, CLEAN CATCH  Final   Special Requests NONE  Final   Culture   Final    NO GROWTH Performed at [facility], [place] [address] [place], [place] [address]    Report Status 01/07/2019 FINAL  Final  MRSA PCR Screening     Status: None   Collection Time: 01/08/19  5:53 PM   Specimen: Nasal Mucosa; Nasopharyngeal  Result Value Ref Range Status   MRSA by PCR NEGATIVE NEGATIVE Final    Comment:        The GeneXpert MRSA Assay (FDA approved for NASAL specimens only), is one component of a comprehensive MRSA colonization surveillance program. It is not intended to diagnose MRSA infection nor to guide or monitor treatment for MRSA infections. Performed at [facility], [place] [address] [place], [place] [address]      Labs: Basic Metabolic Panel: Recent Labs  Lab 01/07/19 [id number] 01/08/19 0357 01/09/19 0507 01/10/19 0325 01/11/19 0510 01/12/19 0418 01/13/19 0324  NA 138 136 132* 132* 130* 129* 132*  K 5.1 4.3 4.8 4.5 4.9 4.8 4.5  CL 101 96* 94* 92* 90* 89* 90*  CO2 17* 22 18* 19* 20* 20* 19*  GLUCOSE 237* 241* 380* 222* 346* 457* 240*  BUN 124* 97* 135* 111* 148* 96* 126*  CREATININE 11.81* 8.82* 10.02* 8.11* 10.03* 7.59* 9.91*  CALCIUM 7.7* 7.9* 7.6* 7.5* 7.6* 7.8* 7.6*  MG 2.3 2.2 2.3 2.1 2.4  --   --   PHOS 5.6* 6.0* 7.6* 7.3* 8.8*  --  10.1*   Liver Function Tests: Recent Labs  Lab 01/09/19 0507 01/10/19 0325 01/11/19 0510 01/12/19 0418 01/13/19 0324  AST 37 33 24 22 21   ALT 31 29 23 21 23   ALKPHOS 102 101 89 84 80  BILITOT 0.7 0.7 0.9 0.7 0.8   PROT 6.3* 6.6 6.4* 6.1* 6.3*  ALBUMIN 2.4* 2.5* 2.2* 2.1* 2.2*   No results for input(s): LIPASE, AMYLASE in the last 168 hours. No results for input(s): AMMONIA in the last 168 hours. CBC: Recent Labs  Lab 01/09/19 0507 01/10/19 0325 01/11/19 0510 01/12/19 0418 01/13/19 0324  WBC 11.8* 10.6* 12.4* 14.9* 22.5*  NEUTROABS 10.9* 9.3* 10.7* 12.7* 19.4*  HGB 8.9* 9.9* 9.3* 9.1* 9.1*  HCT 27.0* 29.7* 27.7* 27.3* 27.6*  MCV 90.0 88.7 87.9 89.8 90.8  PLT 348 364 362 335 390   Cardiac  Enzymes: No results for input(s): CKTOTAL, CKMB, CKMBINDEX, TROPONINI in the last 168 hours. BNP: BNP (last 3 results) Recent Labs    04/24/18 1434 01/07/19 0647 01/13/19 0443  BNP 1,674.1* 2,397.5* 540.7*    ProBNP (last 3 results) No results for input(s): PROBNP in the last 8760 hours.  CBG: Recent Labs  Lab 01/12/19 1156 01/12/19 1806 01/12/19 2120 01/13/19 0819 01/13/19 1305  GLUCAP 343* 372* 308* 208* 308*       Signed:  Kayleen Memos, MD [facility] 01/13/2019, 1:15 PM

## 2019-01-13 NOTE — TOC Transition Note (Signed)
Transition of Care [facility]) - CM/SW Discharge Note   Patient Details  Name: Justin [name] MRN: [id number] Date of Birth: 06-25-1950  Transition of Care [facility]) CM/SW Contact:  Carles Collet, RN Phone Number: 01/13/2019, 1:48 PM   Clinical Narrative:   Damaris Schooner w patient about home oxygen set up. He states that he has a ride for transport home. Dwaine Deter notified that he will need a portable tank delivered to the room and a concentrator set up at home. Patient lives by himself and is not able to open house to have them deliver O2 prior to him getting home. O2 will be delivered to the house after discharge. Patient instructed to call [employer] as soon as he gets home. Address on facesheet verified.     Final next level of care: Home/Self Care Barriers to Discharge: No Barriers Identified   Patient Goals and CMS Choice        Discharge Placement                       Discharge Plan and Services                DME Arranged: Oxygen DME Agency: [facility] Date DME Agency Contacted: 01/13/19 Time DME Agency Contacted: [id number] Representative spoke with at DME Agency: [facility] Determinants of Health ([place]) Interventions     Readmission Risk Interventions No flowsheet data found.

## 2019-01-14 ENCOUNTER — Telehealth: Payer: Self-pay | Admitting: Nephrology

## 2019-01-14 NOTE — Telephone Encounter (Signed)
Transition of Care Contact from [place]  Date of Discharge: 01/13/2019 Date of Contact: 01/14/2019 Method of contact: phone  Attempted to contact patient to discuss transition of care from inpatient admission due to COVID 19 pneumonia. Patient did not answer the phone.  There was not an option to leave a voicemail.  Will attempt to call him again or follow up at dialysis.   Jen Mow, PA-C [facility] Pager: [phone]

## 2019-01-15 ENCOUNTER — Telehealth: Payer: Self-pay | Admitting: Physician Assistant

## 2019-01-15 NOTE — Telephone Encounter (Signed)
Transition of Care Contact from [place]  Date of Discharge: 01/13/2019 Date of Contact: 01/15/2019 Method of contact: phone  Attempted to contact patient to discuss transition of care from inpatient admission due to COVID 19 pneumonia. Patient did not answer the phone.  There was not an option to leave a voicemail.  We will attempt to follow up at outpatient dialysis center.  Anice Paganini, PA-C 01/15/2019, 1:57 PM  [facility] Pager: [phone]

## 2019-01-16 DIAGNOSIS — N2581 Secondary hyperparathyroidism of renal origin: Secondary | ICD-10-CM | POA: Diagnosis not present

## 2019-01-16 DIAGNOSIS — N186 End stage renal disease: Secondary | ICD-10-CM | POA: Diagnosis not present

## 2019-01-16 DIAGNOSIS — Z992 Dependence on renal dialysis: Secondary | ICD-10-CM | POA: Diagnosis not present

## 2019-01-16 DIAGNOSIS — U071 COVID-19: Secondary | ICD-10-CM | POA: Diagnosis not present

## 2019-01-18 DIAGNOSIS — N2581 Secondary hyperparathyroidism of renal origin: Secondary | ICD-10-CM | POA: Diagnosis not present

## 2019-01-18 DIAGNOSIS — N186 End stage renal disease: Secondary | ICD-10-CM | POA: Diagnosis not present

## 2019-01-18 DIAGNOSIS — Z992 Dependence on renal dialysis: Secondary | ICD-10-CM | POA: Diagnosis not present

## 2019-01-21 DIAGNOSIS — N186 End stage renal disease: Secondary | ICD-10-CM | POA: Diagnosis not present

## 2019-01-21 DIAGNOSIS — U071 COVID-19: Secondary | ICD-10-CM | POA: Diagnosis not present

## 2019-01-21 DIAGNOSIS — N2581 Secondary hyperparathyroidism of renal origin: Secondary | ICD-10-CM | POA: Diagnosis not present

## 2019-01-21 DIAGNOSIS — Z992 Dependence on renal dialysis: Secondary | ICD-10-CM | POA: Diagnosis not present

## 2019-01-23 ENCOUNTER — Other Ambulatory Visit: Payer: Self-pay | Admitting: Internal Medicine

## 2019-01-23 DIAGNOSIS — N186 End stage renal disease: Secondary | ICD-10-CM | POA: Diagnosis not present

## 2019-01-23 DIAGNOSIS — U071 COVID-19: Secondary | ICD-10-CM

## 2019-01-23 DIAGNOSIS — N2581 Secondary hyperparathyroidism of renal origin: Secondary | ICD-10-CM | POA: Diagnosis not present

## 2019-01-23 DIAGNOSIS — Z992 Dependence on renal dialysis: Secondary | ICD-10-CM | POA: Diagnosis not present

## 2019-01-25 DIAGNOSIS — Z992 Dependence on renal dialysis: Secondary | ICD-10-CM | POA: Diagnosis not present

## 2019-01-25 DIAGNOSIS — N2581 Secondary hyperparathyroidism of renal origin: Secondary | ICD-10-CM | POA: Diagnosis not present

## 2019-01-25 DIAGNOSIS — N186 End stage renal disease: Secondary | ICD-10-CM | POA: Diagnosis not present

## 2019-01-30 DIAGNOSIS — Z992 Dependence on renal dialysis: Secondary | ICD-10-CM | POA: Diagnosis not present

## 2019-01-30 DIAGNOSIS — N2581 Secondary hyperparathyroidism of renal origin: Secondary | ICD-10-CM | POA: Diagnosis not present

## 2019-01-30 DIAGNOSIS — N186 End stage renal disease: Secondary | ICD-10-CM | POA: Diagnosis not present

## 2019-02-01 DIAGNOSIS — N2581 Secondary hyperparathyroidism of renal origin: Secondary | ICD-10-CM | POA: Diagnosis not present

## 2019-02-01 DIAGNOSIS — Z992 Dependence on renal dialysis: Secondary | ICD-10-CM | POA: Diagnosis not present

## 2019-02-01 DIAGNOSIS — N186 End stage renal disease: Secondary | ICD-10-CM | POA: Diagnosis not present

## 2019-02-02 DIAGNOSIS — Z992 Dependence on renal dialysis: Secondary | ICD-10-CM | POA: Diagnosis not present

## 2019-02-02 DIAGNOSIS — N186 End stage renal disease: Secondary | ICD-10-CM | POA: Diagnosis not present

## 2019-02-02 DIAGNOSIS — E1129 Type 2 diabetes mellitus with other diabetic kidney complication: Secondary | ICD-10-CM | POA: Diagnosis not present

## 2019-02-04 DIAGNOSIS — N186 End stage renal disease: Secondary | ICD-10-CM | POA: Diagnosis not present

## 2019-02-04 DIAGNOSIS — N2581 Secondary hyperparathyroidism of renal origin: Secondary | ICD-10-CM | POA: Diagnosis not present

## 2019-02-04 DIAGNOSIS — Z992 Dependence on renal dialysis: Secondary | ICD-10-CM | POA: Diagnosis not present

## 2019-02-06 DIAGNOSIS — N186 End stage renal disease: Secondary | ICD-10-CM | POA: Diagnosis not present

## 2019-02-06 DIAGNOSIS — N2581 Secondary hyperparathyroidism of renal origin: Secondary | ICD-10-CM | POA: Diagnosis not present

## 2019-02-06 DIAGNOSIS — Z992 Dependence on renal dialysis: Secondary | ICD-10-CM | POA: Diagnosis not present

## 2019-02-08 DIAGNOSIS — Z992 Dependence on renal dialysis: Secondary | ICD-10-CM | POA: Diagnosis not present

## 2019-02-08 DIAGNOSIS — N2581 Secondary hyperparathyroidism of renal origin: Secondary | ICD-10-CM | POA: Diagnosis not present

## 2019-02-08 DIAGNOSIS — N186 End stage renal disease: Secondary | ICD-10-CM | POA: Diagnosis not present

## 2019-02-11 DIAGNOSIS — N186 End stage renal disease: Secondary | ICD-10-CM | POA: Diagnosis not present

## 2019-02-11 DIAGNOSIS — Z992 Dependence on renal dialysis: Secondary | ICD-10-CM | POA: Diagnosis not present

## 2019-02-11 DIAGNOSIS — N2581 Secondary hyperparathyroidism of renal origin: Secondary | ICD-10-CM | POA: Diagnosis not present

## 2019-02-13 DIAGNOSIS — N2581 Secondary hyperparathyroidism of renal origin: Secondary | ICD-10-CM | POA: Diagnosis not present

## 2019-02-13 DIAGNOSIS — I2510 Atherosclerotic heart disease of native coronary artery without angina pectoris: Secondary | ICD-10-CM | POA: Diagnosis not present

## 2019-02-13 DIAGNOSIS — Z992 Dependence on renal dialysis: Secondary | ICD-10-CM | POA: Diagnosis not present

## 2019-02-13 DIAGNOSIS — N186 End stage renal disease: Secondary | ICD-10-CM | POA: Diagnosis not present

## 2019-02-15 DIAGNOSIS — Z992 Dependence on renal dialysis: Secondary | ICD-10-CM | POA: Diagnosis not present

## 2019-02-15 DIAGNOSIS — N186 End stage renal disease: Secondary | ICD-10-CM | POA: Diagnosis not present

## 2019-02-15 DIAGNOSIS — N2581 Secondary hyperparathyroidism of renal origin: Secondary | ICD-10-CM | POA: Diagnosis not present

## 2019-02-18 DIAGNOSIS — N2581 Secondary hyperparathyroidism of renal origin: Secondary | ICD-10-CM | POA: Diagnosis not present

## 2019-02-18 DIAGNOSIS — Z992 Dependence on renal dialysis: Secondary | ICD-10-CM | POA: Diagnosis not present

## 2019-02-18 DIAGNOSIS — N186 End stage renal disease: Secondary | ICD-10-CM | POA: Diagnosis not present

## 2019-02-22 DIAGNOSIS — N186 End stage renal disease: Secondary | ICD-10-CM | POA: Diagnosis not present

## 2019-02-22 DIAGNOSIS — N2581 Secondary hyperparathyroidism of renal origin: Secondary | ICD-10-CM | POA: Diagnosis not present

## 2019-02-22 DIAGNOSIS — Z992 Dependence on renal dialysis: Secondary | ICD-10-CM | POA: Diagnosis not present

## 2019-02-25 DIAGNOSIS — N2581 Secondary hyperparathyroidism of renal origin: Secondary | ICD-10-CM | POA: Diagnosis not present

## 2019-02-25 DIAGNOSIS — Z992 Dependence on renal dialysis: Secondary | ICD-10-CM | POA: Diagnosis not present

## 2019-02-25 DIAGNOSIS — N186 End stage renal disease: Secondary | ICD-10-CM | POA: Diagnosis not present

## 2019-02-27 DIAGNOSIS — Z992 Dependence on renal dialysis: Secondary | ICD-10-CM | POA: Diagnosis not present

## 2019-02-27 DIAGNOSIS — N2581 Secondary hyperparathyroidism of renal origin: Secondary | ICD-10-CM | POA: Diagnosis not present

## 2019-02-27 DIAGNOSIS — N186 End stage renal disease: Secondary | ICD-10-CM | POA: Diagnosis not present

## 2019-03-01 DIAGNOSIS — N2581 Secondary hyperparathyroidism of renal origin: Secondary | ICD-10-CM | POA: Diagnosis not present

## 2019-03-01 DIAGNOSIS — Z992 Dependence on renal dialysis: Secondary | ICD-10-CM | POA: Diagnosis not present

## 2019-03-01 DIAGNOSIS — N186 End stage renal disease: Secondary | ICD-10-CM | POA: Diagnosis not present

## 2019-03-02 DIAGNOSIS — Z992 Dependence on renal dialysis: Secondary | ICD-10-CM | POA: Diagnosis not present

## 2019-03-02 DIAGNOSIS — N186 End stage renal disease: Secondary | ICD-10-CM | POA: Diagnosis not present

## 2019-03-02 DIAGNOSIS — E1129 Type 2 diabetes mellitus with other diabetic kidney complication: Secondary | ICD-10-CM | POA: Diagnosis not present

## 2019-03-04 DIAGNOSIS — Z992 Dependence on renal dialysis: Secondary | ICD-10-CM | POA: Diagnosis not present

## 2019-03-04 DIAGNOSIS — N186 End stage renal disease: Secondary | ICD-10-CM | POA: Diagnosis not present

## 2019-03-04 DIAGNOSIS — N2581 Secondary hyperparathyroidism of renal origin: Secondary | ICD-10-CM | POA: Diagnosis not present

## 2019-03-06 DIAGNOSIS — N2581 Secondary hyperparathyroidism of renal origin: Secondary | ICD-10-CM | POA: Diagnosis not present

## 2019-03-06 DIAGNOSIS — N186 End stage renal disease: Secondary | ICD-10-CM | POA: Diagnosis not present

## 2019-03-06 DIAGNOSIS — Z992 Dependence on renal dialysis: Secondary | ICD-10-CM | POA: Diagnosis not present

## 2019-03-08 DIAGNOSIS — Z992 Dependence on renal dialysis: Secondary | ICD-10-CM | POA: Diagnosis not present

## 2019-03-08 DIAGNOSIS — N2581 Secondary hyperparathyroidism of renal origin: Secondary | ICD-10-CM | POA: Diagnosis not present

## 2019-03-08 DIAGNOSIS — N186 End stage renal disease: Secondary | ICD-10-CM | POA: Diagnosis not present

## 2019-03-11 DIAGNOSIS — Z992 Dependence on renal dialysis: Secondary | ICD-10-CM | POA: Diagnosis not present

## 2019-03-11 DIAGNOSIS — N2581 Secondary hyperparathyroidism of renal origin: Secondary | ICD-10-CM | POA: Diagnosis not present

## 2019-03-11 DIAGNOSIS — N186 End stage renal disease: Secondary | ICD-10-CM | POA: Diagnosis not present

## 2019-03-13 DIAGNOSIS — I2510 Atherosclerotic heart disease of native coronary artery without angina pectoris: Secondary | ICD-10-CM | POA: Diagnosis not present

## 2019-03-13 DIAGNOSIS — Z992 Dependence on renal dialysis: Secondary | ICD-10-CM | POA: Diagnosis not present

## 2019-03-13 DIAGNOSIS — N186 End stage renal disease: Secondary | ICD-10-CM | POA: Diagnosis not present

## 2019-03-13 DIAGNOSIS — N2581 Secondary hyperparathyroidism of renal origin: Secondary | ICD-10-CM | POA: Diagnosis not present

## 2019-03-15 DIAGNOSIS — N186 End stage renal disease: Secondary | ICD-10-CM | POA: Diagnosis not present

## 2019-03-15 DIAGNOSIS — Z992 Dependence on renal dialysis: Secondary | ICD-10-CM | POA: Diagnosis not present

## 2019-03-15 DIAGNOSIS — N2581 Secondary hyperparathyroidism of renal origin: Secondary | ICD-10-CM | POA: Diagnosis not present

## 2019-03-18 DIAGNOSIS — Z992 Dependence on renal dialysis: Secondary | ICD-10-CM | POA: Diagnosis not present

## 2019-03-18 DIAGNOSIS — N186 End stage renal disease: Secondary | ICD-10-CM | POA: Diagnosis not present

## 2019-03-18 DIAGNOSIS — N2581 Secondary hyperparathyroidism of renal origin: Secondary | ICD-10-CM | POA: Diagnosis not present

## 2019-03-20 DIAGNOSIS — N2581 Secondary hyperparathyroidism of renal origin: Secondary | ICD-10-CM | POA: Diagnosis not present

## 2019-03-20 DIAGNOSIS — N186 End stage renal disease: Secondary | ICD-10-CM | POA: Diagnosis not present

## 2019-03-20 DIAGNOSIS — Z992 Dependence on renal dialysis: Secondary | ICD-10-CM | POA: Diagnosis not present

## 2019-03-22 DIAGNOSIS — Z992 Dependence on renal dialysis: Secondary | ICD-10-CM | POA: Diagnosis not present

## 2019-03-22 DIAGNOSIS — N2581 Secondary hyperparathyroidism of renal origin: Secondary | ICD-10-CM | POA: Diagnosis not present

## 2019-03-22 DIAGNOSIS — N186 End stage renal disease: Secondary | ICD-10-CM | POA: Diagnosis not present

## 2019-03-25 DIAGNOSIS — N2581 Secondary hyperparathyroidism of renal origin: Secondary | ICD-10-CM | POA: Diagnosis not present

## 2019-03-25 DIAGNOSIS — N186 End stage renal disease: Secondary | ICD-10-CM | POA: Diagnosis not present

## 2019-03-25 DIAGNOSIS — Z992 Dependence on renal dialysis: Secondary | ICD-10-CM | POA: Diagnosis not present

## 2019-03-27 DIAGNOSIS — N186 End stage renal disease: Secondary | ICD-10-CM | POA: Diagnosis not present

## 2019-03-27 DIAGNOSIS — N2581 Secondary hyperparathyroidism of renal origin: Secondary | ICD-10-CM | POA: Diagnosis not present

## 2019-03-27 DIAGNOSIS — Z992 Dependence on renal dialysis: Secondary | ICD-10-CM | POA: Diagnosis not present

## 2019-03-29 DIAGNOSIS — N2581 Secondary hyperparathyroidism of renal origin: Secondary | ICD-10-CM | POA: Diagnosis not present

## 2019-03-29 DIAGNOSIS — Z992 Dependence on renal dialysis: Secondary | ICD-10-CM | POA: Diagnosis not present

## 2019-03-29 DIAGNOSIS — N186 End stage renal disease: Secondary | ICD-10-CM | POA: Diagnosis not present

## 2019-04-01 DIAGNOSIS — Z992 Dependence on renal dialysis: Secondary | ICD-10-CM | POA: Diagnosis not present

## 2019-04-01 DIAGNOSIS — N186 End stage renal disease: Secondary | ICD-10-CM | POA: Diagnosis not present

## 2019-04-01 DIAGNOSIS — N2581 Secondary hyperparathyroidism of renal origin: Secondary | ICD-10-CM | POA: Diagnosis not present

## 2019-04-02 DIAGNOSIS — E1129 Type 2 diabetes mellitus with other diabetic kidney complication: Secondary | ICD-10-CM | POA: Diagnosis not present

## 2019-04-02 DIAGNOSIS — Z992 Dependence on renal dialysis: Secondary | ICD-10-CM | POA: Diagnosis not present

## 2019-04-02 DIAGNOSIS — N186 End stage renal disease: Secondary | ICD-10-CM | POA: Diagnosis not present

## 2019-04-03 DIAGNOSIS — N2581 Secondary hyperparathyroidism of renal origin: Secondary | ICD-10-CM | POA: Diagnosis not present

## 2019-04-03 DIAGNOSIS — N186 End stage renal disease: Secondary | ICD-10-CM | POA: Diagnosis not present

## 2019-04-03 DIAGNOSIS — Z992 Dependence on renal dialysis: Secondary | ICD-10-CM | POA: Diagnosis not present

## 2019-04-08 DIAGNOSIS — Z992 Dependence on renal dialysis: Secondary | ICD-10-CM | POA: Diagnosis not present

## 2019-04-08 DIAGNOSIS — N2581 Secondary hyperparathyroidism of renal origin: Secondary | ICD-10-CM | POA: Diagnosis not present

## 2019-04-08 DIAGNOSIS — N186 End stage renal disease: Secondary | ICD-10-CM | POA: Diagnosis not present

## 2019-04-09 DIAGNOSIS — Z452 Encounter for adjustment and management of vascular access device: Secondary | ICD-10-CM | POA: Diagnosis not present

## 2019-04-10 DIAGNOSIS — N2581 Secondary hyperparathyroidism of renal origin: Secondary | ICD-10-CM | POA: Diagnosis not present

## 2019-04-10 DIAGNOSIS — N186 End stage renal disease: Secondary | ICD-10-CM | POA: Diagnosis not present

## 2019-04-10 DIAGNOSIS — Z992 Dependence on renal dialysis: Secondary | ICD-10-CM | POA: Diagnosis not present

## 2019-04-13 DIAGNOSIS — I2510 Atherosclerotic heart disease of native coronary artery without angina pectoris: Secondary | ICD-10-CM | POA: Diagnosis not present

## 2019-04-15 DIAGNOSIS — N186 End stage renal disease: Secondary | ICD-10-CM | POA: Diagnosis not present

## 2019-04-15 DIAGNOSIS — N2581 Secondary hyperparathyroidism of renal origin: Secondary | ICD-10-CM | POA: Diagnosis not present

## 2019-04-15 DIAGNOSIS — Z992 Dependence on renal dialysis: Secondary | ICD-10-CM | POA: Diagnosis not present

## 2019-04-17 DIAGNOSIS — Z992 Dependence on renal dialysis: Secondary | ICD-10-CM | POA: Diagnosis not present

## 2019-04-17 DIAGNOSIS — N2581 Secondary hyperparathyroidism of renal origin: Secondary | ICD-10-CM | POA: Diagnosis not present

## 2019-04-17 DIAGNOSIS — N186 End stage renal disease: Secondary | ICD-10-CM | POA: Diagnosis not present

## 2019-04-19 DIAGNOSIS — N186 End stage renal disease: Secondary | ICD-10-CM | POA: Diagnosis not present

## 2019-04-19 DIAGNOSIS — Z992 Dependence on renal dialysis: Secondary | ICD-10-CM | POA: Diagnosis not present

## 2019-04-19 DIAGNOSIS — N2581 Secondary hyperparathyroidism of renal origin: Secondary | ICD-10-CM | POA: Diagnosis not present

## 2019-04-22 DIAGNOSIS — N186 End stage renal disease: Secondary | ICD-10-CM | POA: Diagnosis not present

## 2019-04-22 DIAGNOSIS — Z992 Dependence on renal dialysis: Secondary | ICD-10-CM | POA: Diagnosis not present

## 2019-04-22 DIAGNOSIS — N2581 Secondary hyperparathyroidism of renal origin: Secondary | ICD-10-CM | POA: Diagnosis not present

## 2019-04-24 DIAGNOSIS — N2581 Secondary hyperparathyroidism of renal origin: Secondary | ICD-10-CM | POA: Diagnosis not present

## 2019-04-24 DIAGNOSIS — N186 End stage renal disease: Secondary | ICD-10-CM | POA: Diagnosis not present

## 2019-04-24 DIAGNOSIS — Z992 Dependence on renal dialysis: Secondary | ICD-10-CM | POA: Diagnosis not present

## 2019-04-26 DIAGNOSIS — N186 End stage renal disease: Secondary | ICD-10-CM | POA: Diagnosis not present

## 2019-04-26 DIAGNOSIS — Z992 Dependence on renal dialysis: Secondary | ICD-10-CM | POA: Diagnosis not present

## 2019-04-26 DIAGNOSIS — N2581 Secondary hyperparathyroidism of renal origin: Secondary | ICD-10-CM | POA: Diagnosis not present

## 2019-04-29 DIAGNOSIS — N186 End stage renal disease: Secondary | ICD-10-CM | POA: Diagnosis not present

## 2019-04-29 DIAGNOSIS — Z992 Dependence on renal dialysis: Secondary | ICD-10-CM | POA: Diagnosis not present

## 2019-04-29 DIAGNOSIS — N2581 Secondary hyperparathyroidism of renal origin: Secondary | ICD-10-CM | POA: Diagnosis not present

## 2019-04-29 NOTE — Progress Notes (Deleted)
HPI: FU CAD and hypertension. Patient admitted to [facility] April 2020 with hypertensive urgency and congestive heart failure. Also with chest discomfort. Echocardiogram April 2020 showed ejection fraction 35 to 40%, moderate left ventricular enlargement, moderate left atrial enlargement, small pericardial effusion, moderate aortic stenosis. Cardiac catheterization revealed 50 to 60% mid LAD, apical 90% LAD, 75% first diagonal, 75% circumflex, occluded first marginal, 50% right coronary artery and 95% PDA. Mild aortic stenosis. Medical therapy recommended. Ptalso had baseline renal insufficiency and initial creatinine was 6.76. He was ultimately started on dialysis.  Chest CT January 2021 showed no pulmonary embolus, findings suggestive of Covid pneumonia, small to moderate pericardial effusion and coronary calcification. Since he was last seen,   Current Outpatient Medications  Medication Sig Dispense Refill  . acetaminophen (TYLENOL) 325 MG tablet Take 325 mg by mouth every 6 (six) hours as needed (pain).     [name] Kitchen albuterol (VENTOLIN HFA) 108 (90 Base) MCG/ACT inhaler Inhale 2 puffs into the lungs 2 (two) times daily as needed for wheezing or shortness of breath. 8 g 0  . aspirin EC 81 MG EC tablet Take 1 tablet (81 mg total) by mouth daily. (Patient taking differently: Take 81 mg by mouth See admin instructions. Take one tablet (81 mg) by mouth with breakfast on Sunday, Monday, Wednesday, Friday; take one tablet (81 mg) with lunch on Tuesday, Thursday, Saturday (dialysis days)) 30 tablet 0  . hydrALAZINE (APRESOLINE) 25 MG tablet Take 1 tablet (25 mg total) by mouth every 8 (eight) hours. 90 tablet 0  . isosorbide mononitrate (IMDUR) 30 MG 24 hr tablet Take 1 tablet (30 mg total) by mouth daily. (Patient taking differently: Take 30 mg by mouth See admin instructions. Take one tablet (30 mg) by mouth with breakfast on Sunday, Monday, Wednesday, Friday; take one tablet (30 mg) with  lunch on Tuesday, Thursday, Saturday (dialysis days)) 30 tablet 0  . metoprolol succinate (TOPROL-XL) 25 MG 24 hr tablet Take 0.5 tablets (12.5 mg total) by mouth at bedtime. 60 tablet 0  . NOVOLIN 70/30 RELION (70-30) 100 UNIT/ML injection Inject 20 Units into the skin daily. (Patient taking differently: Inject 20 Units into the skin See admin instructions. Inject 20 units subcutaneously with breakfast on Sunday, Monday, Wednesday, Friday; inject 20 units with lunch on Tuesday, Thursday, Saturday (dialysis days)) 10 mL 0  . oxyCODONE (ROXICODONE) 5 MG immediate release tablet Take 1 tablet (5 mg total) by mouth every 6 (six) hours as needed. (Patient taking differently: Take 5 mg by mouth every 6 (six) hours as needed (pain). ) 6 tablet 0  . rosuvastatin (CRESTOR) 40 MG tablet Take 1 tablet (40 mg total) by mouth daily. (Patient taking differently: Take 40 mg by mouth See admin instructions. Take one tablet (40 mg) by mouth with breakfast on Sunday, Monday, Wednesday, Friday; take one tablet (40 mg) with lunch on Tuesday, Thursday, Saturday (dialysis days)) 90 tablet 3  . sevelamer carbonate (RENVELA) 800 MG tablet Take 2,400 mg by mouth 3 (three) times daily with meals.      No current facility-administered medications for this visit.     Past Medical History:  Diagnosis Date  . BPH (benign prostatic hyperplasia)   . Chronic kidney disease (CKD), stage IV (severe) ([place])   . Diabetes ([place])   . Dysuria   . Erectile dysfunction   . Hyperlipemia   . Hypertension   . Pinna disorder, left   . Renal disorder   . Wears  glasses     Past Surgical History:  Procedure Laterality Date  . AV FISTULA PLACEMENT Left 04/30/2018   Procedure: CREATION OF RADIOCEPHALIC ARTERIOVENOUS FISTULA LEFT ARM;  Surgeon: Elam Dutch, MD;  Location: [place];  Service: Vascular;  Laterality: Left;  . AV FISTULA PLACEMENT Left 08/28/2018   Procedure: ARTERIOVENOUS (AV) BRACHIOCEPHALIC FISTULA CREATION LEFT UPPER  ARM;  Surgeon: Marty Heck, MD;  Location: [facility];  Service: Vascular;  Laterality: Left;  . IR FLUORO GUIDE CV LINE RIGHT  04/29/2018  . IR US GUIDE VASC ACCESS RIGHT  04/29/2018  . RIGHT/LEFT HEART CATH AND CORONARY ANGIOGRAPHY N/A 05/01/2018   Procedure: RIGHT/LEFT HEART CATH AND CORONARY ANGIOGRAPHY;  Surgeon: Belva Crome, MD;  Location: [place] [facility];  Service: Cardiovascular;  Laterality: N/A;    Social History   Socioeconomic History  . Marital status: Divorced    Spouse name: Not on file  . Number of children: Not on file  . Years of education: Not on file  . Highest education level: Not on file  Occupational History  . Not on file  Tobacco Use  . Smoking status: Never Smoker  . Smokeless tobacco: Never Used  Substance and Sexual Activity  . Alcohol use: Not Currently  . Drug use: Never  . Sexual activity: Not Currently  Other Topics Concern  . Not on file  Social History Narrative  . Not on file   Social Determinants of Health   Financial Resource Strain:   . Difficulty of Paying Living Expenses:   Food Insecurity:   . Worried About Charity fundraiser in the Last Year:   . Arboriculturist in the Last Year:   Transportation Needs:   . Film/video editor (Medical):   [name] Kitchen Lack of Transportation (Non-Medical):   Physical Activity:   . Days of Exercise per Week:   . Minutes of Exercise per Session:   Stress:   . Feeling of Stress :   Social Connections:   . Frequency of Communication with Friends and Family:   . Frequency of Social Gatherings with Friends and Family:   . Attends Religious Services:   . Active Member of Clubs or Organizations:   . Attends Archivist Meetings:   [name] Kitchen Marital Status:   Intimate Partner Violence:   . Fear of Current or Ex-Partner:   . Emotionally Abused:   [name] Kitchen Physically Abused:   . Sexually Abused:     Family History  Problem Relation Age of Onset  . Stroke Mother   . Diabetes Father     ROS: no  fevers or chills, productive cough, hemoptysis, dysphasia, odynophagia, melena, hematochezia, dysuria, hematuria, rash, seizure activity, orthopnea, PND, pedal edema, claudication. Remaining systems are negative.  Physical Exam: Well-developed well-nourished in no acute distress.  Skin is warm and dry.  HEENT is normal.  Neck is supple.  Chest is clear to auscultation with normal expansion.  Cardiovascular exam is regular rate and rhythm.  Abdominal exam nontender or distended. No masses palpated. Extremities show no edema. neuro grossly intact  ECG- personally reviewed  A/P  1 coronary artery disease-patient has not had chest pain.  Continue medical therapy with aspirin and statin.  2 aortic stenosis-we will arrange follow-up echocardiogram.  3 cardiomyopathy-this was felt likely to be hypertensive mediated previously.  Continue hydralazine/nitrates and beta-blocker.  We will arrange repeat echocardiogram to reassess LV function.  4 small to moderate pericardial effusion-noted on previous CT.  Repeat  echocardiogram to reassess.  5 hypertension-blood pressure controlled.  Continue present medications.  6 chronic combined systolic/diastolic congestive heart failure-volume status per dialysis.  7 hyperlipidemia-continue statin.  Check lipids and liver.  8 end-stage renal disease-dialysis per nephrology.  Kirk Ruths, MD

## 2019-05-01 ENCOUNTER — Ambulatory Visit: Payer: Medicare HMO | Admitting: Cardiology

## 2019-05-01 DIAGNOSIS — Z992 Dependence on renal dialysis: Secondary | ICD-10-CM | POA: Diagnosis not present

## 2019-05-01 DIAGNOSIS — N2581 Secondary hyperparathyroidism of renal origin: Secondary | ICD-10-CM | POA: Diagnosis not present

## 2019-05-01 DIAGNOSIS — N186 End stage renal disease: Secondary | ICD-10-CM | POA: Diagnosis not present

## 2019-05-02 DIAGNOSIS — N186 End stage renal disease: Secondary | ICD-10-CM | POA: Diagnosis not present

## 2019-05-02 DIAGNOSIS — E1129 Type 2 diabetes mellitus with other diabetic kidney complication: Secondary | ICD-10-CM | POA: Diagnosis not present

## 2019-05-02 DIAGNOSIS — Z992 Dependence on renal dialysis: Secondary | ICD-10-CM | POA: Diagnosis not present

## 2019-05-03 DIAGNOSIS — Z992 Dependence on renal dialysis: Secondary | ICD-10-CM | POA: Diagnosis not present

## 2019-05-03 DIAGNOSIS — N186 End stage renal disease: Secondary | ICD-10-CM | POA: Diagnosis not present

## 2019-05-03 DIAGNOSIS — N2581 Secondary hyperparathyroidism of renal origin: Secondary | ICD-10-CM | POA: Diagnosis not present

## 2019-05-06 DIAGNOSIS — N186 End stage renal disease: Secondary | ICD-10-CM | POA: Diagnosis not present

## 2019-05-06 DIAGNOSIS — N2581 Secondary hyperparathyroidism of renal origin: Secondary | ICD-10-CM | POA: Diagnosis not present

## 2019-05-06 DIAGNOSIS — Z992 Dependence on renal dialysis: Secondary | ICD-10-CM | POA: Diagnosis not present

## 2019-05-08 DIAGNOSIS — N186 End stage renal disease: Secondary | ICD-10-CM | POA: Diagnosis not present

## 2019-05-08 DIAGNOSIS — H2513 Age-related nuclear cataract, bilateral: Secondary | ICD-10-CM | POA: Diagnosis not present

## 2019-05-08 DIAGNOSIS — H52203 Unspecified astigmatism, bilateral: Secondary | ICD-10-CM | POA: Diagnosis not present

## 2019-05-08 DIAGNOSIS — E113293 Type 2 diabetes mellitus with mild nonproliferative diabetic retinopathy without macular edema, bilateral: Secondary | ICD-10-CM | POA: Diagnosis not present

## 2019-05-08 DIAGNOSIS — H25013 Cortical age-related cataract, bilateral: Secondary | ICD-10-CM | POA: Diagnosis not present

## 2019-05-08 DIAGNOSIS — Z992 Dependence on renal dialysis: Secondary | ICD-10-CM | POA: Diagnosis not present

## 2019-05-08 DIAGNOSIS — N2581 Secondary hyperparathyroidism of renal origin: Secondary | ICD-10-CM | POA: Diagnosis not present

## 2019-05-10 DIAGNOSIS — N186 End stage renal disease: Secondary | ICD-10-CM | POA: Diagnosis not present

## 2019-05-10 DIAGNOSIS — N2581 Secondary hyperparathyroidism of renal origin: Secondary | ICD-10-CM | POA: Diagnosis not present

## 2019-05-10 DIAGNOSIS — Z992 Dependence on renal dialysis: Secondary | ICD-10-CM | POA: Diagnosis not present

## 2019-05-13 DIAGNOSIS — N2581 Secondary hyperparathyroidism of renal origin: Secondary | ICD-10-CM | POA: Diagnosis not present

## 2019-05-13 DIAGNOSIS — Z992 Dependence on renal dialysis: Secondary | ICD-10-CM | POA: Diagnosis not present

## 2019-05-13 DIAGNOSIS — N186 End stage renal disease: Secondary | ICD-10-CM | POA: Diagnosis not present

## 2019-05-13 DIAGNOSIS — I2510 Atherosclerotic heart disease of native coronary artery without angina pectoris: Secondary | ICD-10-CM | POA: Diagnosis not present

## 2019-05-15 DIAGNOSIS — Z992 Dependence on renal dialysis: Secondary | ICD-10-CM | POA: Diagnosis not present

## 2019-05-15 DIAGNOSIS — N2581 Secondary hyperparathyroidism of renal origin: Secondary | ICD-10-CM | POA: Diagnosis not present

## 2019-05-15 DIAGNOSIS — N186 End stage renal disease: Secondary | ICD-10-CM | POA: Diagnosis not present

## 2019-05-17 DIAGNOSIS — N186 End stage renal disease: Secondary | ICD-10-CM | POA: Diagnosis not present

## 2019-05-17 DIAGNOSIS — N2581 Secondary hyperparathyroidism of renal origin: Secondary | ICD-10-CM | POA: Diagnosis not present

## 2019-05-17 DIAGNOSIS — Z992 Dependence on renal dialysis: Secondary | ICD-10-CM | POA: Diagnosis not present

## 2019-05-20 DIAGNOSIS — Z992 Dependence on renal dialysis: Secondary | ICD-10-CM | POA: Diagnosis not present

## 2019-05-20 DIAGNOSIS — N186 End stage renal disease: Secondary | ICD-10-CM | POA: Diagnosis not present

## 2019-05-20 DIAGNOSIS — N2581 Secondary hyperparathyroidism of renal origin: Secondary | ICD-10-CM | POA: Diagnosis not present

## 2019-05-20 NOTE — Progress Notes (Deleted)
Cardiology Clinic Note   Patient Name: Justin [name] Date of Encounter: 05/20/2019  Primary Care Provider:  Reynold Bowen, MD Primary Cardiologist:  Kirk Ruths, MD  Patient Profile    Justin [name]. Justin [name] 69 year old male presents today for follow-up evaluation of his hypertension and CHF.  Past Medical History    Past Medical History:  Diagnosis Date  . BPH (benign prostatic hyperplasia)   . Chronic kidney disease (CKD), stage IV (severe) ([place])   . Diabetes ([place])   . Dysuria   . Erectile dysfunction   . Hyperlipemia   . Hypertension   . Pinna disorder, left   . Renal disorder   . Wears glasses    Past Surgical History:  Procedure Laterality Date  . AV FISTULA PLACEMENT Left 04/30/2018   Procedure: CREATION OF RADIOCEPHALIC ARTERIOVENOUS FISTULA LEFT ARM;  Surgeon: Elam Dutch, MD;  Location: [facility];  Service: Vascular;  Laterality: Left;  . AV FISTULA PLACEMENT Left 08/28/2018   Procedure: ARTERIOVENOUS (AV) BRACHIOCEPHALIC FISTULA CREATION LEFT UPPER ARM;  Surgeon: Marty Heck, MD;  Location: [facility];  Service: Vascular;  Laterality: Left;  . IR FLUORO GUIDE CV LINE RIGHT  04/29/2018  . IR US GUIDE VASC ACCESS RIGHT  04/29/2018  . RIGHT/LEFT HEART CATH AND CORONARY ANGIOGRAPHY N/A 05/01/2018   Procedure: RIGHT/LEFT HEART CATH AND CORONARY ANGIOGRAPHY;  Surgeon: Belva Crome, MD;  Location: [place] [facility];  Service: Cardiovascular;  Laterality: N/A;    Allergies  No Known Allergies  History of Present Illness    Justin [name] has a PMH of hypertensive urgency that required admission to [facility] 4/20.  He also has congestive heart failure and a history of chest discomfort.  Echocardiogram 4/20 showed an EF of 35-40%, moderate left ventricular enlargement, moderate left atrial enlargement, small pericardial effusion, moderate aortic stenosis.  His cardiac catheterization showed  50-60% midLAD, apical 90% LAD, 75% first diagonal, 75% circumflex,  occluded first marginal, 50% right coronary artery and 95% PDA.  Mild aortic stenosis.  Medical management was recommended.  He has  baseline renal insufficiency with a creatinine of around 6.7.  He was started on HD.  When he was last seen by Dr. Stanford Breed on 08/29/2018 he was noted to have dyspnea with more extreme activities but not with routine activities.  His dyspnea was relieved with rest.  He denied chest pain, orthopnea, PND, and lower extremity edema.  He had no syncope or palpitations.  And he denied exertional chest pain.  He presents the clinic today for follow-up evaluation and states***  *** denies chest pain, shortness of breath, lower extremity edema, fatigue, palpitations, melena, hematuria, hemoptysis, diaphoresis, weakness, presyncope, syncope, orthopnea, and PND.   Home Medications    Prior to Admission medications   Medication Sig Start Date End Date Taking? Authorizing Provider  acetaminophen (TYLENOL) 325 MG tablet Take 325 mg by mouth every 6 (six) hours as needed (pain).     [provider]  albuterol (VENTOLIN HFA) 108 (90 Base) MCG/ACT inhaler Inhale 2 puffs into the lungs 2 (two) times daily as needed for wheezing or shortness of breath. 01/13/19   Kayleen Memos, DO  aspirin EC 81 MG EC tablet Take 1 tablet (81 mg total) by mouth daily. Patient taking differently: Take 81 mg by mouth See admin instructions. Take one tablet (81 mg) by mouth with breakfast on Sunday, Monday, Wednesday, Friday; take one tablet (81 mg) with lunch on Tuesday, Thursday, Saturday (dialysis  days) 05/03/18   Florencia Reasons, MD  hydrALAZINE (APRESOLINE) 25 MG tablet Take 1 tablet (25 mg total) by mouth every 8 (eight) hours. 01/13/19   Kayleen Memos, DO  isosorbide mononitrate (IMDUR) 30 MG 24 hr tablet Take 1 tablet (30 mg total) by mouth daily. Patient taking differently: Take 30 mg by mouth See admin instructions. Take one tablet (30 mg) by mouth with breakfast on Sunday, Monday, Wednesday,  Friday; take one tablet (30 mg) with lunch on Tuesday, Thursday, Saturday (dialysis days) 05/02/18   Florencia Reasons, MD  metoprolol succinate (TOPROL-XL) 25 MG 24 hr tablet Take 0.5 tablets (12.5 mg total) by mouth at bedtime. 01/13/19   Kayleen Memos, DO  NOVOLIN 70/30 RELION (70-30) 100 UNIT/ML injection Inject 20 Units into the skin daily. Patient taking differently: Inject 20 Units into the skin See admin instructions. Inject 20 units subcutaneously with breakfast on Sunday, Monday, Wednesday, Friday; inject 20 units with lunch on Tuesday, Thursday, Saturday (dialysis days) 05/02/18   Florencia Reasons, MD  oxyCODONE (ROXICODONE) 5 MG immediate release tablet Take 1 tablet (5 mg total) by mouth every 6 (six) hours as needed. Patient taking differently: Take 5 mg by mouth every 6 (six) hours as needed (pain).  08/28/18   Rhyne, Hulen Shouts, PA-C  rosuvastatin (CRESTOR) 40 MG tablet Take 1 tablet (40 mg total) by mouth daily. Patient taking differently: Take 40 mg by mouth See admin instructions. Take one tablet (40 mg) by mouth with breakfast on Sunday, Monday, Wednesday, Friday; take one tablet (40 mg) with lunch on Tuesday, Thursday, Saturday (dialysis days) 08/29/18   Lelon Perla, MD  sevelamer carbonate (RENVELA) 800 MG tablet Take 2,400 mg by mouth 3 (three) times daily with meals.  08/13/18   [provider]    Family History    Family History  Problem Relation Age of Onset  . Stroke Mother   . Diabetes Father    He indicated that his mother is alive. He indicated that his father is alive.  Social History    Social History   Socioeconomic History  . Marital status: Divorced    Spouse name: Not on file  . Number of children: Not on file  . Years of education: Not on file  . Highest education level: Not on file  Occupational History  . Not on file  Tobacco Use  . Smoking status: Never Smoker  . Smokeless tobacco: Never Used  Substance and Sexual Activity  . Alcohol use: Not  Currently  . Drug use: Never  . Sexual activity: Not Currently  Other Topics Concern  . Not on file  Social History Narrative  . Not on file   Social Determinants of Health   Financial Resource Strain:   . Difficulty of Paying Living Expenses:   Food Insecurity:   . Worried About Charity fundraiser in the Last Year:   . Arboriculturist in the Last Year:   Transportation Needs:   . Film/video editor (Medical):   [name] Kitchen Lack of Transportation (Non-Medical):   Physical Activity:   . Days of Exercise per Week:   . Minutes of Exercise per Session:   Stress:   . Feeling of Stress :   Social Connections:   . Frequency of Communication with Friends and Family:   . Frequency of Social Gatherings with Friends and Family:   . Attends Religious Services:   . Active Member of Clubs or Organizations:   . Attends  Club or Organization Meetings:   [name] Kitchen Marital Status:   Intimate Partner Violence:   . Fear of Current or Ex-Partner:   . Emotionally Abused:   [name] Kitchen Physically Abused:   . Sexually Abused:      Review of Systems    General:  No chills, fever, night sweats or weight changes.  Cardiovascular:  No chest pain, dyspnea on exertion, edema, orthopnea, palpitations, paroxysmal nocturnal dyspnea. Dermatological: No rash, lesions/masses Respiratory: No cough, dyspnea Urologic: No hematuria, dysuria Abdominal:   No nausea, vomiting, diarrhea, bright red blood per rectum, melena, or hematemesis Neurologic:  No visual changes, wkns, changes in mental status. All other systems reviewed and are otherwise negative except as noted above.  Physical Exam    VS:  There were no vitals taken for this visit. , BMI There is no height or weight on file to calculate BMI. GEN: Well nourished, well developed, in no acute distress. HEENT: normal. Neck: Supple, no JVD, carotid bruits, or masses. Cardiac: RRR, no murmurs, rubs, or gallops. No clubbing, cyanosis, edema.  Radials/DP/PT 2+ and equal  bilaterally.  Respiratory:  Respirations regular and unlabored, clear to auscultation bilaterally. GI: Soft, nontender, nondistended, BS + x 4. MS: no deformity or atrophy. Skin: warm and dry, no rash. Neuro:  Strength and sensation are intact. Psych: Normal affect.  Accessory Clinical Findings    ECG personally reviewed by me today- *** - No acute changes  EKG 01/06/19 Sinus rhythm left anterior fascicular block 69 bpm  Echocardiogram 04/25/2018 IMPRESSIONS    1. The left ventricle has moderately reduced systolic function, with an  ejection fraction of 35-40%. The cavity size was moderately dilated. Left  ventricular diastolic parameters were normal.  2. Inferior and septal hypokinesis.  3. The right ventricle has normal systolc function. The cavity was mildly  enlarged. There is no increase in right ventricular wall thickness.  4. Left atrial size was moderately dilated.  5. Small pericardial effusion.  6. Small non circumferential pericardial effusion no tamponade.  7. Mild thickening of the mitral valve leaflet.  8. The aortic valve is tricuspid Moderate thickening of the aortic valve  Moderate calcification of the aortic valve. Moderate stenosis of the  aortic valve.  9. Likely tri leaflet given degree of LV dysfunction would grade AS as  moderate.  10. The aortic root is normal in size and structure.  11. The inferior vena cava was dilated in size with <50% respiratory  variability.   Assessment & Plan   1.  Coronary artery disease-no chest pain today. Continue current medical therapy Heart healthy low-sodium diet Increase physical activity as tolerated  Aortic stenosis-no increased dyspnea or activity intolerance.  Echocardiogram 04/25/2018 showed moderate aortic valve stenosis. Continue to monitor  Cardiomyopathy-previously believed to be related to hypertension.  No increased activity intolerance. Continue current medical therapy Repeat  echocardiogram-previously recommended  Essential hypertension-BP today***.  Well-controlled at home.  Patient continues HD Continue current medical therapy Heart healthy low-sodium diet Increase physical activity as tolerated  Hyperlipidemia-LDL 60 on 04/25/2018. Continue statin Continue heart healthy low-sodium high-fiber diet Increase physical activity as tolerated Repeat lipid panel and LFTs   End-stage renal disease-on HD Followed by nephrology  Disposition: Follow-up with Dr. Stanford Breed in 6 months.  Jossie Ng. Deon Ivey NP-C    05/20/2019, 12:58 PM [address] Office [phone] Fax [phone]

## 2019-05-21 ENCOUNTER — Ambulatory Visit: Payer: Medicare HMO | Admitting: General Practice

## 2019-05-22 DIAGNOSIS — N2581 Secondary hyperparathyroidism of renal origin: Secondary | ICD-10-CM | POA: Diagnosis not present

## 2019-05-22 DIAGNOSIS — Z992 Dependence on renal dialysis: Secondary | ICD-10-CM | POA: Diagnosis not present

## 2019-05-22 DIAGNOSIS — N186 End stage renal disease: Secondary | ICD-10-CM | POA: Diagnosis not present

## 2019-05-24 DIAGNOSIS — N2581 Secondary hyperparathyroidism of renal origin: Secondary | ICD-10-CM | POA: Diagnosis not present

## 2019-05-24 DIAGNOSIS — N186 End stage renal disease: Secondary | ICD-10-CM | POA: Diagnosis not present

## 2019-05-24 DIAGNOSIS — Z992 Dependence on renal dialysis: Secondary | ICD-10-CM | POA: Diagnosis not present

## 2019-05-27 DIAGNOSIS — Z992 Dependence on renal dialysis: Secondary | ICD-10-CM | POA: Diagnosis not present

## 2019-05-27 DIAGNOSIS — N186 End stage renal disease: Secondary | ICD-10-CM | POA: Diagnosis not present

## 2019-05-27 DIAGNOSIS — N2581 Secondary hyperparathyroidism of renal origin: Secondary | ICD-10-CM | POA: Diagnosis not present

## 2019-05-29 DIAGNOSIS — Z992 Dependence on renal dialysis: Secondary | ICD-10-CM | POA: Diagnosis not present

## 2019-05-29 DIAGNOSIS — N2581 Secondary hyperparathyroidism of renal origin: Secondary | ICD-10-CM | POA: Diagnosis not present

## 2019-05-29 DIAGNOSIS — N186 End stage renal disease: Secondary | ICD-10-CM | POA: Diagnosis not present

## 2019-05-31 DIAGNOSIS — Z992 Dependence on renal dialysis: Secondary | ICD-10-CM | POA: Diagnosis not present

## 2019-05-31 DIAGNOSIS — N2581 Secondary hyperparathyroidism of renal origin: Secondary | ICD-10-CM | POA: Diagnosis not present

## 2019-05-31 DIAGNOSIS — N186 End stage renal disease: Secondary | ICD-10-CM | POA: Diagnosis not present

## 2019-06-02 DIAGNOSIS — E1129 Type 2 diabetes mellitus with other diabetic kidney complication: Secondary | ICD-10-CM | POA: Diagnosis not present

## 2019-06-02 DIAGNOSIS — N186 End stage renal disease: Secondary | ICD-10-CM | POA: Diagnosis not present

## 2019-06-02 DIAGNOSIS — Z992 Dependence on renal dialysis: Secondary | ICD-10-CM | POA: Diagnosis not present

## 2019-06-03 DIAGNOSIS — Z992 Dependence on renal dialysis: Secondary | ICD-10-CM | POA: Diagnosis not present

## 2019-06-03 DIAGNOSIS — N2581 Secondary hyperparathyroidism of renal origin: Secondary | ICD-10-CM | POA: Diagnosis not present

## 2019-06-03 DIAGNOSIS — N186 End stage renal disease: Secondary | ICD-10-CM | POA: Diagnosis not present

## 2019-06-04 NOTE — Progress Notes (Signed)
Cardiology Clinic Note   Patient Name: Justin [name] Date of Encounter: 06/05/2019  Primary Care Provider:  Reynold Bowen, MD Primary Cardiologist:  Kirk Ruths, MD  Patient Profile    Justin [name] 69 year old male presents today for follow-up evaluation of his hypertension and CHF.  Past Medical History    Past Medical History:  Diagnosis Date  . BPH (benign prostatic hyperplasia)   . Chronic kidney disease (CKD), stage IV (severe) ([place])   . Diabetes ([place])   . Dysuria   . Erectile dysfunction   . Hyperlipemia   . Hypertension   . Pinna disorder, left   . Renal disorder   . Wears glasses    Past Surgical History:  Procedure Laterality Date  . AV FISTULA PLACEMENT Left 04/30/2018   Procedure: CREATION OF RADIOCEPHALIC ARTERIOVENOUS FISTULA LEFT ARM;  Surgeon: Elam Dutch, MD;  Location: [place];  Service: Vascular;  Laterality: Left;  . AV FISTULA PLACEMENT Left 08/28/2018   Procedure: ARTERIOVENOUS (AV) BRACHIOCEPHALIC FISTULA CREATION LEFT UPPER ARM;  Surgeon: Marty Heck, MD;  Location: [place];  Service: Vascular;  Laterality: Left;  . IR FLUORO GUIDE CV LINE RIGHT  04/29/2018  . IR US GUIDE VASC ACCESS RIGHT  04/29/2018  . RIGHT/LEFT HEART CATH AND CORONARY ANGIOGRAPHY N/A 05/01/2018   Procedure: RIGHT/LEFT HEART CATH AND CORONARY ANGIOGRAPHY;  Surgeon: Belva Crome, MD;  Location: [place] [facility];  Service: Cardiovascular;  Laterality: N/A;    Allergies  No Known Allergies  History of Present Illness    Justin [name] has a PMH of hypertensive urgency that required admission to [facility] 4/20.  He also has congestive heart failure and a history of chest discomfort.  Echocardiogram 4/20 showed an EF of 35-40%, moderate left ventricular enlargement, moderate left atrial enlargement, small pericardial effusion, moderate aortic stenosis.  His cardiac catheterization showed  50-60% midLAD, apical 90% LAD, 75% first diagonal, 75% circumflex,  occluded first marginal, 50% right coronary artery and 95% PDA.  Mild aortic stenosis.  Medical management was recommended.  He has  baseline renal insufficiency with a creatinine of around 6.7.  He was started on HD.  When he was last seen by Dr. Stanford Breed on 08/29/2018 he was noted to have dyspnea with more extreme activities but not with routine activities.  His dyspnea was relieved with rest.  He denied chest pain, orthopnea, PND, and lower extremity edema.  He had no syncope or palpitations.  And he denied exertional chest pain.  He presents the clinic today for follow-up evaluation and states from a cardiac standpoint he has been doing well.  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  He continues dialysis Tuesday Thursday and Saturday.  He notices that he experiences cramping with longer treatments.  However, if he has a 3-1/2-hour treatment he has no cramping.  He has also noticed that during his last 2 treatments he has had clots in his fistula.  He eats a low-salt diet and his blood pressure has been well controlled.  He denies episodes of dyspnea with normal activity and increased activity.  Today he denies chest pain, shortness of breath, lower extremity edema, fatigue, palpitations, melena, hematuria, hemoptysis, diaphoresis, weakness, presyncope, syncope, orthopnea, and PND.  Home Medications    Prior to Admission medications   Medication Sig Start Date End Date Taking? Authorizing Provider  acetaminophen (TYLENOL) 325 MG tablet  Take 325 mg by mouth every 6 (six) hours as needed (pain).     [provider]  albuterol (VENTOLIN HFA) 108 (90 Base) MCG/ACT inhaler Inhale 2 puffs into the lungs 2 (two) times daily as needed for wheezing or shortness of breath. 01/13/19   Kayleen Memos, DO  aspirin EC 81 MG EC tablet Take 1 tablet (81 mg total) by mouth daily. Patient taking differently: Take 81 mg by mouth See admin  instructions. Take one tablet (81 mg) by mouth with breakfast on Sunday, Monday, Wednesday, Friday; take one tablet (81 mg) with lunch on Tuesday, Thursday, Saturday (dialysis days) 05/03/18   Florencia Reasons, MD  hydrALAZINE (APRESOLINE) 25 MG tablet Take 1 tablet (25 mg total) by mouth every 8 (eight) hours. 01/13/19   Kayleen Memos, DO  isosorbide mononitrate (IMDUR) 30 MG 24 hr tablet Take 1 tablet (30 mg total) by mouth daily. Patient taking differently: Take 30 mg by mouth See admin instructions. Take one tablet (30 mg) by mouth with breakfast on Sunday, Monday, Wednesday, Friday; take one tablet (30 mg) with lunch on Tuesday, Thursday, Saturday (dialysis days) 05/02/18   Florencia Reasons, MD  metoprolol succinate (TOPROL-XL) 25 MG 24 hr tablet Take 0.5 tablets (12.5 mg total) by mouth at bedtime. 01/13/19   Kayleen Memos, DO  NOVOLIN 70/30 RELION (70-30) 100 UNIT/ML injection Inject 20 Units into the skin daily. Patient taking differently: Inject 20 Units into the skin See admin instructions. Inject 20 units subcutaneously with breakfast on Sunday, Monday, Wednesday, Friday; inject 20 units with lunch on Tuesday, Thursday, Saturday (dialysis days) 05/02/18   Florencia Reasons, MD  oxyCODONE (ROXICODONE) 5 MG immediate release tablet Take 1 tablet (5 mg total) by mouth every 6 (six) hours as needed. Patient taking differently: Take 5 mg by mouth every 6 (six) hours as needed (pain).  08/28/18   Rhyne, Hulen Shouts, PA-C  rosuvastatin (CRESTOR) 40 MG tablet Take 1 tablet (40 mg total) by mouth daily. Patient taking differently: Take 40 mg by mouth See admin instructions. Take one tablet (40 mg) by mouth with breakfast on Sunday, Monday, Wednesday, Friday; take one tablet (40 mg) with lunch on Tuesday, Thursday, Saturday (dialysis days) 08/29/18   Lelon Perla, MD  sevelamer carbonate (RENVELA) 800 MG tablet Take 2,400 mg by mouth 3 (three) times daily with meals.  08/13/18   [provider]    Family History     Family History  Problem Relation Age of Onset  . Stroke Mother   . Diabetes Father    He indicated that his mother is alive. He indicated that his father is alive.  Social History    Social History   Socioeconomic History  . Marital status: Divorced    Spouse name: Not on file  . Number of children: Not on file  . Years of education: Not on file  . Highest education level: Not on file  Occupational History  . Not on file  Tobacco Use  . Smoking status: Never Smoker  . Smokeless tobacco: Never Used  Substance and Sexual Activity  . Alcohol use: Not Currently  . Drug use: Never  . Sexual activity: Not Currently  Other Topics Concern  . Not on file  Social History Narrative  . Not on file   Social Determinants of Health   Financial Resource Strain:   . Difficulty of Paying Living Expenses:   Food Insecurity:   . Worried About Charity fundraiser in  the Last Year:   . [place] in the Last Year:   Transportation Needs:   . Film/video editor (Medical):   [name] Kitchen Lack of Transportation (Non-Medical):   Physical Activity:   . Days of Exercise per Week:   . Minutes of Exercise per Session:   Stress:   . Feeling of Stress :   Social Connections:   . Frequency of Communication with Friends and Family:   . Frequency of Social Gatherings with Friends and Family:   . Attends Religious Services:   . Active Member of Clubs or Organizations:   . Attends Archivist Meetings:   [name] Kitchen Marital Status:   Intimate Partner Violence:   . Fear of Current or Ex-Partner:   . Emotionally Abused:   [name] Kitchen Physically Abused:   . Sexually Abused:      Review of Systems    General:  No chills, fever, night sweats or weight changes.  Cardiovascular:  No chest pain, dyspnea on exertion, edema, orthopnea, palpitations, paroxysmal nocturnal dyspnea. Dermatological: No rash, lesions/masses Respiratory: No cough, dyspnea Urologic: No hematuria, dysuria Abdominal:   No nausea,  vomiting, diarrhea, bright red blood per rectum, melena, or hematemesis Neurologic:  No visual changes, wkns, changes in mental status. All other systems reviewed and are otherwise negative except as noted above.  Physical Exam    VS:  BP 125/60   Pulse 75   Temp 98 F (36.7 C)   Ht 5\' 8"  (1.727 m)   Wt 188 lb 9.6 oz (85.5 kg)   SpO2 95%   BMI 28.68 kg/m  , BMI Body mass index is 28.68 kg/m. GEN: Well nourished, well developed, in no acute distress. HEENT: normal. Neck: Supple, no JVD, carotid bruits, or masses. Cardiac: RRR, no murmurs, rubs, or gallops. No clubbing, cyanosis, edema.  Radials/DP/PT 2+ and equal bilaterally.  Respiratory:  Respirations regular and unlabored, clear to auscultation bilaterally. GI: Soft, nontender, nondistended, BS + x 4. MS: no deformity or atrophy. Skin: warm and dry, no rash. Neuro:  Strength and sensation are intact. Psych: Normal affect.  Accessory Clinical Findings    ECG personally reviewed by me today-none today.  EKG 01/06/19 Sinus rhythm left anterior fascicular block 69 bpm  Echocardiogram 04/25/2018 IMPRESSIONS     1. The left ventricle has moderately reduced systolic function, with an  ejection fraction of 35-40%. The cavity size was moderately dilated. Left  ventricular diastolic parameters were normal.   2. Inferior and septal hypokinesis.   3. The right ventricle has normal systolc function. The cavity was mildly  enlarged. There is no increase in right ventricular wall thickness.   4. Left atrial size was moderately dilated.   5. Small pericardial effusion.   6. Small non circumferential pericardial effusion no tamponade.   7. Mild thickening of the mitral valve leaflet.   8. The aortic valve is tricuspid Moderate thickening of the aortic valve  Moderate calcification of the aortic valve. Moderate stenosis of the  aortic valve.   9. Likely tri leaflet given degree of LV dysfunction would grade AS as  moderate.  10. The  aortic root is normal in size and structure.  11. The inferior vena cava was dilated in size with <50% respiratory  variability.   Assessment & Plan   1.  Coronary artery disease-no chest pain today. Continue current medical therapy Heart healthy low-sodium diet Increase physical activity as tolerated  Aortic stenosis-no increased dyspnea or activity intolerance.  Echocardiogram 04/25/2018  showed moderate aortic valve stenosis. Continue to monitor output  Cardiomyopathy-previously believed to be related to hypertension.  No increased activity intolerance. Continue current medical therapy Repeat echocardiogram-previously recommended  Essential hypertension-BP today 125/60.  Well-controlled at home.  Patient continues HD Continue current medical therapy Heart healthy low-sodium diet Increase physical activity as tolerated  Hyperlipidemia-LDL 60 on 04/25/2018. Continue statin Continue heart healthy low-sodium high-fiber diet Increase physical activity as tolerated Direct LDL and LFT's   End-stage renal disease-on HD Tuesday Thursday Saturday.  Has cramping with 4-hour treatments but feels well after 3.5-hour treatments.  Has also noticed clots in his fistula during his last 2 treatments. Instructed to contact nephrology  Disposition: Follow-up with Dr. Stanford Breed in 6 months.  Jossie Ng. Tenzin Edelman NP-C    06/05/2019, 4:26 PM [facility] [address] Office [phone] Fax [phone]

## 2019-06-05 ENCOUNTER — Other Ambulatory Visit: Payer: Self-pay

## 2019-06-05 ENCOUNTER — Ambulatory Visit (INDEPENDENT_AMBULATORY_CARE_PROVIDER_SITE_OTHER): Payer: Medicare HMO | Admitting: General Practice

## 2019-06-05 ENCOUNTER — Encounter: Payer: Self-pay | Admitting: General Practice

## 2019-06-05 VITALS — BP 125/60 | HR 75 | Temp 98.0°F | Ht 68.0 in | Wt 188.6 lb

## 2019-06-05 DIAGNOSIS — I2510 Atherosclerotic heart disease of native coronary artery without angina pectoris: Secondary | ICD-10-CM

## 2019-06-05 DIAGNOSIS — N186 End stage renal disease: Secondary | ICD-10-CM | POA: Diagnosis not present

## 2019-06-05 DIAGNOSIS — I350 Nonrheumatic aortic (valve) stenosis: Secondary | ICD-10-CM

## 2019-06-05 DIAGNOSIS — I420 Dilated cardiomyopathy: Secondary | ICD-10-CM | POA: Diagnosis not present

## 2019-06-05 DIAGNOSIS — Z79899 Other long term (current) drug therapy: Secondary | ICD-10-CM | POA: Diagnosis not present

## 2019-06-05 DIAGNOSIS — N2581 Secondary hyperparathyroidism of renal origin: Secondary | ICD-10-CM | POA: Diagnosis not present

## 2019-06-05 DIAGNOSIS — E7800 Pure hypercholesterolemia, unspecified: Secondary | ICD-10-CM

## 2019-06-05 DIAGNOSIS — I10 Essential (primary) hypertension: Secondary | ICD-10-CM | POA: Diagnosis not present

## 2019-06-05 DIAGNOSIS — Z992 Dependence on renal dialysis: Secondary | ICD-10-CM | POA: Diagnosis not present

## 2019-06-05 NOTE — Patient Instructions (Addendum)
Medication Instructions:  The current medical regimen is effective;  continue present plan and medications as directed. Please refer to the Current Medication list given to you today. *If you need a refill on your cardiac medications before your next appointment, please call your pharmacy*  LAB: DIRECT LDL AND LFT TODAY HERE IN OUR OFFICE   Special Instructions CONTACT NEPHROLOGY-COR CLOTS IN FISTULA AND IV LENGTH OF TIME-STOP AT 3.5 HOURS  PLEASE READ AND FOLLOW SALTY 6-ATTACHED  Follow-Up: Your next appointment:  3 month(s)  In Person with Kirk Ruths, MD  At [facility], you and your health needs are our priority.  As part of our continuing mission to provide you with exceptional heart care, we have created designated Provider Care Teams.  These Care Teams include your primary Cardiologist (physician) and Advanced Practice Providers (APPs -  Physician Assistants and Nurse Practitioners) who all work together to provide you with the care you need, when you need it.  We recommend signing up for the patient portal called "MyChart".  Sign up information is provided on this After Visit Summary.  MyChart is used to connect with patients for Virtual Visits (Telemedicine).  Patients are able to view lab/test results, encounter notes, upcoming appointments, etc.  Non-urgent messages can be sent to your provider as well.   To learn more about what you can do with MyChart, go to [email or web address].

## 2019-06-06 LAB — HEPATIC FUNCTION PANEL
ALT: 21 IU/L (ref 0–44)
AST: 24 IU/L (ref 0–40)
Albumin: 4.5 g/dL (ref 3.8–4.8)
Alkaline Phosphatase: 113 IU/L (ref 48–121)
Bilirubin Total: 0.6 mg/dL (ref 0.0–1.2)
Bilirubin, Direct: 0.22 mg/dL (ref 0.00–0.40)
Total Protein: 6.8 g/dL (ref 6.0–8.5)

## 2019-06-06 LAB — LDL CHOLESTEROL, DIRECT: LDL Direct: 32 mg/dL (ref 0–99)

## 2019-06-07 DIAGNOSIS — N186 End stage renal disease: Secondary | ICD-10-CM | POA: Diagnosis not present

## 2019-06-07 DIAGNOSIS — Z992 Dependence on renal dialysis: Secondary | ICD-10-CM | POA: Diagnosis not present

## 2019-06-07 DIAGNOSIS — N2581 Secondary hyperparathyroidism of renal origin: Secondary | ICD-10-CM | POA: Diagnosis not present

## 2019-06-10 DIAGNOSIS — N2581 Secondary hyperparathyroidism of renal origin: Secondary | ICD-10-CM | POA: Diagnosis not present

## 2019-06-10 DIAGNOSIS — Z992 Dependence on renal dialysis: Secondary | ICD-10-CM | POA: Diagnosis not present

## 2019-06-10 DIAGNOSIS — N186 End stage renal disease: Secondary | ICD-10-CM | POA: Diagnosis not present

## 2019-06-12 DIAGNOSIS — N186 End stage renal disease: Secondary | ICD-10-CM | POA: Diagnosis not present

## 2019-06-12 DIAGNOSIS — Z992 Dependence on renal dialysis: Secondary | ICD-10-CM | POA: Diagnosis not present

## 2019-06-12 DIAGNOSIS — N2581 Secondary hyperparathyroidism of renal origin: Secondary | ICD-10-CM | POA: Diagnosis not present

## 2019-06-13 DIAGNOSIS — I2510 Atherosclerotic heart disease of native coronary artery without angina pectoris: Secondary | ICD-10-CM | POA: Diagnosis not present

## 2019-06-17 DIAGNOSIS — Z992 Dependence on renal dialysis: Secondary | ICD-10-CM | POA: Diagnosis not present

## 2019-06-17 DIAGNOSIS — N186 End stage renal disease: Secondary | ICD-10-CM | POA: Diagnosis not present

## 2019-06-17 DIAGNOSIS — N2581 Secondary hyperparathyroidism of renal origin: Secondary | ICD-10-CM | POA: Diagnosis not present

## 2019-06-24 DIAGNOSIS — N186 End stage renal disease: Secondary | ICD-10-CM | POA: Diagnosis not present

## 2019-06-24 DIAGNOSIS — N2581 Secondary hyperparathyroidism of renal origin: Secondary | ICD-10-CM | POA: Diagnosis not present

## 2019-06-24 DIAGNOSIS — Z992 Dependence on renal dialysis: Secondary | ICD-10-CM | POA: Diagnosis not present

## 2019-06-26 DIAGNOSIS — Z992 Dependence on renal dialysis: Secondary | ICD-10-CM | POA: Diagnosis not present

## 2019-06-26 DIAGNOSIS — N186 End stage renal disease: Secondary | ICD-10-CM | POA: Diagnosis not present

## 2019-06-26 DIAGNOSIS — N2581 Secondary hyperparathyroidism of renal origin: Secondary | ICD-10-CM | POA: Diagnosis not present

## 2019-06-28 DIAGNOSIS — N186 End stage renal disease: Secondary | ICD-10-CM | POA: Diagnosis not present

## 2019-06-28 DIAGNOSIS — N2581 Secondary hyperparathyroidism of renal origin: Secondary | ICD-10-CM | POA: Diagnosis not present

## 2019-06-28 DIAGNOSIS — Z992 Dependence on renal dialysis: Secondary | ICD-10-CM | POA: Diagnosis not present

## 2019-07-01 DIAGNOSIS — N2581 Secondary hyperparathyroidism of renal origin: Secondary | ICD-10-CM | POA: Diagnosis not present

## 2019-07-01 DIAGNOSIS — Z992 Dependence on renal dialysis: Secondary | ICD-10-CM | POA: Diagnosis not present

## 2019-07-01 DIAGNOSIS — N186 End stage renal disease: Secondary | ICD-10-CM | POA: Diagnosis not present

## 2019-07-02 DIAGNOSIS — E1129 Type 2 diabetes mellitus with other diabetic kidney complication: Secondary | ICD-10-CM | POA: Diagnosis not present

## 2019-07-02 DIAGNOSIS — N186 End stage renal disease: Secondary | ICD-10-CM | POA: Diagnosis not present

## 2019-07-02 DIAGNOSIS — Z992 Dependence on renal dialysis: Secondary | ICD-10-CM | POA: Diagnosis not present

## 2019-07-03 DIAGNOSIS — Z992 Dependence on renal dialysis: Secondary | ICD-10-CM | POA: Diagnosis not present

## 2019-07-03 DIAGNOSIS — N186 End stage renal disease: Secondary | ICD-10-CM | POA: Diagnosis not present

## 2019-07-03 DIAGNOSIS — N2581 Secondary hyperparathyroidism of renal origin: Secondary | ICD-10-CM | POA: Diagnosis not present

## 2019-07-05 DIAGNOSIS — N186 End stage renal disease: Secondary | ICD-10-CM | POA: Diagnosis not present

## 2019-07-05 DIAGNOSIS — N2581 Secondary hyperparathyroidism of renal origin: Secondary | ICD-10-CM | POA: Diagnosis not present

## 2019-07-05 DIAGNOSIS — Z992 Dependence on renal dialysis: Secondary | ICD-10-CM | POA: Diagnosis not present

## 2019-07-08 DIAGNOSIS — N2581 Secondary hyperparathyroidism of renal origin: Secondary | ICD-10-CM | POA: Diagnosis not present

## 2019-07-08 DIAGNOSIS — N186 End stage renal disease: Secondary | ICD-10-CM | POA: Diagnosis not present

## 2019-07-08 DIAGNOSIS — Z992 Dependence on renal dialysis: Secondary | ICD-10-CM | POA: Diagnosis not present

## 2019-07-10 DIAGNOSIS — Z992 Dependence on renal dialysis: Secondary | ICD-10-CM | POA: Diagnosis not present

## 2019-07-10 DIAGNOSIS — N2581 Secondary hyperparathyroidism of renal origin: Secondary | ICD-10-CM | POA: Diagnosis not present

## 2019-07-10 DIAGNOSIS — N186 End stage renal disease: Secondary | ICD-10-CM | POA: Diagnosis not present

## 2019-07-12 DIAGNOSIS — Z992 Dependence on renal dialysis: Secondary | ICD-10-CM | POA: Diagnosis not present

## 2019-07-12 DIAGNOSIS — N186 End stage renal disease: Secondary | ICD-10-CM | POA: Diagnosis not present

## 2019-07-12 DIAGNOSIS — N2581 Secondary hyperparathyroidism of renal origin: Secondary | ICD-10-CM | POA: Diagnosis not present

## 2019-07-13 DIAGNOSIS — I2510 Atherosclerotic heart disease of native coronary artery without angina pectoris: Secondary | ICD-10-CM | POA: Diagnosis not present

## 2019-07-15 DIAGNOSIS — N186 End stage renal disease: Secondary | ICD-10-CM | POA: Diagnosis not present

## 2019-07-15 DIAGNOSIS — N2581 Secondary hyperparathyroidism of renal origin: Secondary | ICD-10-CM | POA: Diagnosis not present

## 2019-07-15 DIAGNOSIS — Z992 Dependence on renal dialysis: Secondary | ICD-10-CM | POA: Diagnosis not present

## 2019-07-17 DIAGNOSIS — Z992 Dependence on renal dialysis: Secondary | ICD-10-CM | POA: Diagnosis not present

## 2019-07-17 DIAGNOSIS — N2581 Secondary hyperparathyroidism of renal origin: Secondary | ICD-10-CM | POA: Diagnosis not present

## 2019-07-17 DIAGNOSIS — N186 End stage renal disease: Secondary | ICD-10-CM | POA: Diagnosis not present

## 2019-07-19 DIAGNOSIS — N186 End stage renal disease: Secondary | ICD-10-CM | POA: Diagnosis not present

## 2019-07-19 DIAGNOSIS — N2581 Secondary hyperparathyroidism of renal origin: Secondary | ICD-10-CM | POA: Diagnosis not present

## 2019-07-19 DIAGNOSIS — Z992 Dependence on renal dialysis: Secondary | ICD-10-CM | POA: Diagnosis not present

## 2019-07-24 DIAGNOSIS — Z992 Dependence on renal dialysis: Secondary | ICD-10-CM | POA: Diagnosis not present

## 2019-07-24 DIAGNOSIS — N186 End stage renal disease: Secondary | ICD-10-CM | POA: Diagnosis not present

## 2019-07-24 DIAGNOSIS — N2581 Secondary hyperparathyroidism of renal origin: Secondary | ICD-10-CM | POA: Diagnosis not present

## 2019-07-26 DIAGNOSIS — Z992 Dependence on renal dialysis: Secondary | ICD-10-CM | POA: Diagnosis not present

## 2019-07-26 DIAGNOSIS — N2581 Secondary hyperparathyroidism of renal origin: Secondary | ICD-10-CM | POA: Diagnosis not present

## 2019-07-26 DIAGNOSIS — N186 End stage renal disease: Secondary | ICD-10-CM | POA: Diagnosis not present

## 2019-07-29 DIAGNOSIS — N186 End stage renal disease: Secondary | ICD-10-CM | POA: Diagnosis not present

## 2019-07-29 DIAGNOSIS — N2581 Secondary hyperparathyroidism of renal origin: Secondary | ICD-10-CM | POA: Diagnosis not present

## 2019-07-29 DIAGNOSIS — Z992 Dependence on renal dialysis: Secondary | ICD-10-CM | POA: Diagnosis not present

## 2019-07-31 DIAGNOSIS — N2581 Secondary hyperparathyroidism of renal origin: Secondary | ICD-10-CM | POA: Diagnosis not present

## 2019-07-31 DIAGNOSIS — N186 End stage renal disease: Secondary | ICD-10-CM | POA: Diagnosis not present

## 2019-07-31 DIAGNOSIS — Z992 Dependence on renal dialysis: Secondary | ICD-10-CM | POA: Diagnosis not present

## 2019-08-02 DIAGNOSIS — N186 End stage renal disease: Secondary | ICD-10-CM | POA: Diagnosis not present

## 2019-08-02 DIAGNOSIS — N2581 Secondary hyperparathyroidism of renal origin: Secondary | ICD-10-CM | POA: Diagnosis not present

## 2019-08-02 DIAGNOSIS — Z992 Dependence on renal dialysis: Secondary | ICD-10-CM | POA: Diagnosis not present

## 2019-08-02 DIAGNOSIS — E1129 Type 2 diabetes mellitus with other diabetic kidney complication: Secondary | ICD-10-CM | POA: Diagnosis not present

## 2019-08-05 DIAGNOSIS — N186 End stage renal disease: Secondary | ICD-10-CM | POA: Diagnosis not present

## 2019-08-05 DIAGNOSIS — Z992 Dependence on renal dialysis: Secondary | ICD-10-CM | POA: Diagnosis not present

## 2019-08-05 DIAGNOSIS — N2581 Secondary hyperparathyroidism of renal origin: Secondary | ICD-10-CM | POA: Diagnosis not present

## 2019-08-07 DIAGNOSIS — Z992 Dependence on renal dialysis: Secondary | ICD-10-CM | POA: Diagnosis not present

## 2019-08-07 DIAGNOSIS — N186 End stage renal disease: Secondary | ICD-10-CM | POA: Diagnosis not present

## 2019-08-07 DIAGNOSIS — N2581 Secondary hyperparathyroidism of renal origin: Secondary | ICD-10-CM | POA: Diagnosis not present

## 2019-08-09 DIAGNOSIS — N2581 Secondary hyperparathyroidism of renal origin: Secondary | ICD-10-CM | POA: Diagnosis not present

## 2019-08-09 DIAGNOSIS — N186 End stage renal disease: Secondary | ICD-10-CM | POA: Diagnosis not present

## 2019-08-09 DIAGNOSIS — Z992 Dependence on renal dialysis: Secondary | ICD-10-CM | POA: Diagnosis not present

## 2019-08-12 DIAGNOSIS — N2581 Secondary hyperparathyroidism of renal origin: Secondary | ICD-10-CM | POA: Diagnosis not present

## 2019-08-12 DIAGNOSIS — Z992 Dependence on renal dialysis: Secondary | ICD-10-CM | POA: Diagnosis not present

## 2019-08-12 DIAGNOSIS — N186 End stage renal disease: Secondary | ICD-10-CM | POA: Diagnosis not present

## 2019-08-13 DIAGNOSIS — I2510 Atherosclerotic heart disease of native coronary artery without angina pectoris: Secondary | ICD-10-CM | POA: Diagnosis not present

## 2019-08-14 DIAGNOSIS — N2581 Secondary hyperparathyroidism of renal origin: Secondary | ICD-10-CM | POA: Diagnosis not present

## 2019-08-14 DIAGNOSIS — Z992 Dependence on renal dialysis: Secondary | ICD-10-CM | POA: Diagnosis not present

## 2019-08-14 DIAGNOSIS — N186 End stage renal disease: Secondary | ICD-10-CM | POA: Diagnosis not present

## 2019-08-16 DIAGNOSIS — N2581 Secondary hyperparathyroidism of renal origin: Secondary | ICD-10-CM | POA: Diagnosis not present

## 2019-08-16 DIAGNOSIS — Z992 Dependence on renal dialysis: Secondary | ICD-10-CM | POA: Diagnosis not present

## 2019-08-16 DIAGNOSIS — N186 End stage renal disease: Secondary | ICD-10-CM | POA: Diagnosis not present

## 2019-08-19 DIAGNOSIS — N186 End stage renal disease: Secondary | ICD-10-CM | POA: Diagnosis not present

## 2019-08-19 DIAGNOSIS — Z992 Dependence on renal dialysis: Secondary | ICD-10-CM | POA: Diagnosis not present

## 2019-08-19 DIAGNOSIS — N2581 Secondary hyperparathyroidism of renal origin: Secondary | ICD-10-CM | POA: Diagnosis not present

## 2019-08-21 DIAGNOSIS — Z992 Dependence on renal dialysis: Secondary | ICD-10-CM | POA: Diagnosis not present

## 2019-08-21 DIAGNOSIS — N186 End stage renal disease: Secondary | ICD-10-CM | POA: Diagnosis not present

## 2019-08-21 DIAGNOSIS — N2581 Secondary hyperparathyroidism of renal origin: Secondary | ICD-10-CM | POA: Diagnosis not present

## 2019-08-23 DIAGNOSIS — N2581 Secondary hyperparathyroidism of renal origin: Secondary | ICD-10-CM | POA: Diagnosis not present

## 2019-08-23 DIAGNOSIS — Z992 Dependence on renal dialysis: Secondary | ICD-10-CM | POA: Diagnosis not present

## 2019-08-23 DIAGNOSIS — N186 End stage renal disease: Secondary | ICD-10-CM | POA: Diagnosis not present

## 2019-08-26 DIAGNOSIS — Z992 Dependence on renal dialysis: Secondary | ICD-10-CM | POA: Diagnosis not present

## 2019-08-26 DIAGNOSIS — N2581 Secondary hyperparathyroidism of renal origin: Secondary | ICD-10-CM | POA: Diagnosis not present

## 2019-08-26 DIAGNOSIS — N186 End stage renal disease: Secondary | ICD-10-CM | POA: Diagnosis not present

## 2019-08-28 DIAGNOSIS — Z992 Dependence on renal dialysis: Secondary | ICD-10-CM | POA: Diagnosis not present

## 2019-08-28 DIAGNOSIS — N186 End stage renal disease: Secondary | ICD-10-CM | POA: Diagnosis not present

## 2019-08-28 DIAGNOSIS — N2581 Secondary hyperparathyroidism of renal origin: Secondary | ICD-10-CM | POA: Diagnosis not present

## 2019-08-30 DIAGNOSIS — N2581 Secondary hyperparathyroidism of renal origin: Secondary | ICD-10-CM | POA: Diagnosis not present

## 2019-08-30 DIAGNOSIS — Z992 Dependence on renal dialysis: Secondary | ICD-10-CM | POA: Diagnosis not present

## 2019-08-30 DIAGNOSIS — N186 End stage renal disease: Secondary | ICD-10-CM | POA: Diagnosis not present

## 2019-09-02 DIAGNOSIS — N186 End stage renal disease: Secondary | ICD-10-CM | POA: Diagnosis not present

## 2019-09-02 DIAGNOSIS — Z992 Dependence on renal dialysis: Secondary | ICD-10-CM | POA: Diagnosis not present

## 2019-09-02 DIAGNOSIS — N2581 Secondary hyperparathyroidism of renal origin: Secondary | ICD-10-CM | POA: Diagnosis not present

## 2019-09-02 DIAGNOSIS — E1129 Type 2 diabetes mellitus with other diabetic kidney complication: Secondary | ICD-10-CM | POA: Diagnosis not present

## 2019-09-04 DIAGNOSIS — N186 End stage renal disease: Secondary | ICD-10-CM | POA: Diagnosis not present

## 2019-09-04 DIAGNOSIS — N2581 Secondary hyperparathyroidism of renal origin: Secondary | ICD-10-CM | POA: Diagnosis not present

## 2019-09-04 DIAGNOSIS — Z992 Dependence on renal dialysis: Secondary | ICD-10-CM | POA: Diagnosis not present

## 2019-09-06 DIAGNOSIS — N2581 Secondary hyperparathyroidism of renal origin: Secondary | ICD-10-CM | POA: Diagnosis not present

## 2019-09-06 DIAGNOSIS — N186 End stage renal disease: Secondary | ICD-10-CM | POA: Diagnosis not present

## 2019-09-06 DIAGNOSIS — Z992 Dependence on renal dialysis: Secondary | ICD-10-CM | POA: Diagnosis not present

## 2019-09-09 DIAGNOSIS — N2581 Secondary hyperparathyroidism of renal origin: Secondary | ICD-10-CM | POA: Diagnosis not present

## 2019-09-09 DIAGNOSIS — Z992 Dependence on renal dialysis: Secondary | ICD-10-CM | POA: Diagnosis not present

## 2019-09-09 DIAGNOSIS — N186 End stage renal disease: Secondary | ICD-10-CM | POA: Diagnosis not present

## 2019-09-11 DIAGNOSIS — Z992 Dependence on renal dialysis: Secondary | ICD-10-CM | POA: Diagnosis not present

## 2019-09-11 DIAGNOSIS — N186 End stage renal disease: Secondary | ICD-10-CM | POA: Diagnosis not present

## 2019-09-11 DIAGNOSIS — N2581 Secondary hyperparathyroidism of renal origin: Secondary | ICD-10-CM | POA: Diagnosis not present

## 2019-09-13 DIAGNOSIS — Z992 Dependence on renal dialysis: Secondary | ICD-10-CM | POA: Diagnosis not present

## 2019-09-13 DIAGNOSIS — N186 End stage renal disease: Secondary | ICD-10-CM | POA: Diagnosis not present

## 2019-09-13 DIAGNOSIS — N2581 Secondary hyperparathyroidism of renal origin: Secondary | ICD-10-CM | POA: Diagnosis not present

## 2019-09-16 DIAGNOSIS — N186 End stage renal disease: Secondary | ICD-10-CM | POA: Diagnosis not present

## 2019-09-16 DIAGNOSIS — N2581 Secondary hyperparathyroidism of renal origin: Secondary | ICD-10-CM | POA: Diagnosis not present

## 2019-09-16 DIAGNOSIS — Z992 Dependence on renal dialysis: Secondary | ICD-10-CM | POA: Diagnosis not present

## 2019-09-18 DIAGNOSIS — N186 End stage renal disease: Secondary | ICD-10-CM | POA: Diagnosis not present

## 2019-09-18 DIAGNOSIS — N2581 Secondary hyperparathyroidism of renal origin: Secondary | ICD-10-CM | POA: Diagnosis not present

## 2019-09-18 DIAGNOSIS — Z992 Dependence on renal dialysis: Secondary | ICD-10-CM | POA: Diagnosis not present

## 2019-09-20 DIAGNOSIS — N2581 Secondary hyperparathyroidism of renal origin: Secondary | ICD-10-CM | POA: Diagnosis not present

## 2019-09-20 DIAGNOSIS — Z992 Dependence on renal dialysis: Secondary | ICD-10-CM | POA: Diagnosis not present

## 2019-09-20 DIAGNOSIS — N186 End stage renal disease: Secondary | ICD-10-CM | POA: Diagnosis not present

## 2019-09-23 DIAGNOSIS — N186 End stage renal disease: Secondary | ICD-10-CM | POA: Diagnosis not present

## 2019-09-23 DIAGNOSIS — N2581 Secondary hyperparathyroidism of renal origin: Secondary | ICD-10-CM | POA: Diagnosis not present

## 2019-09-23 DIAGNOSIS — Z992 Dependence on renal dialysis: Secondary | ICD-10-CM | POA: Diagnosis not present

## 2019-09-25 DIAGNOSIS — N2581 Secondary hyperparathyroidism of renal origin: Secondary | ICD-10-CM | POA: Diagnosis not present

## 2019-09-25 DIAGNOSIS — Z992 Dependence on renal dialysis: Secondary | ICD-10-CM | POA: Diagnosis not present

## 2019-09-25 DIAGNOSIS — N186 End stage renal disease: Secondary | ICD-10-CM | POA: Diagnosis not present

## 2019-09-27 DIAGNOSIS — Z992 Dependence on renal dialysis: Secondary | ICD-10-CM | POA: Diagnosis not present

## 2019-09-27 DIAGNOSIS — N186 End stage renal disease: Secondary | ICD-10-CM | POA: Diagnosis not present

## 2019-09-27 DIAGNOSIS — N2581 Secondary hyperparathyroidism of renal origin: Secondary | ICD-10-CM | POA: Diagnosis not present

## 2019-09-30 DIAGNOSIS — Z992 Dependence on renal dialysis: Secondary | ICD-10-CM | POA: Diagnosis not present

## 2019-09-30 DIAGNOSIS — N186 End stage renal disease: Secondary | ICD-10-CM | POA: Diagnosis not present

## 2019-09-30 DIAGNOSIS — N2581 Secondary hyperparathyroidism of renal origin: Secondary | ICD-10-CM | POA: Diagnosis not present

## 2019-10-01 DIAGNOSIS — Z7682 Awaiting organ transplant status: Secondary | ICD-10-CM | POA: Diagnosis not present

## 2019-10-01 DIAGNOSIS — Z01818 Encounter for other preprocedural examination: Secondary | ICD-10-CM | POA: Diagnosis not present

## 2019-10-02 DIAGNOSIS — N186 End stage renal disease: Secondary | ICD-10-CM | POA: Diagnosis not present

## 2019-10-02 DIAGNOSIS — N2581 Secondary hyperparathyroidism of renal origin: Secondary | ICD-10-CM | POA: Diagnosis not present

## 2019-10-02 DIAGNOSIS — Z992 Dependence on renal dialysis: Secondary | ICD-10-CM | POA: Diagnosis not present

## 2019-10-02 DIAGNOSIS — E1129 Type 2 diabetes mellitus with other diabetic kidney complication: Secondary | ICD-10-CM | POA: Diagnosis not present

## 2019-10-04 DIAGNOSIS — Z992 Dependence on renal dialysis: Secondary | ICD-10-CM | POA: Diagnosis not present

## 2019-10-04 DIAGNOSIS — N186 End stage renal disease: Secondary | ICD-10-CM | POA: Diagnosis not present

## 2019-10-04 DIAGNOSIS — N2581 Secondary hyperparathyroidism of renal origin: Secondary | ICD-10-CM | POA: Diagnosis not present

## 2019-10-07 DIAGNOSIS — N2581 Secondary hyperparathyroidism of renal origin: Secondary | ICD-10-CM | POA: Diagnosis not present

## 2019-10-07 DIAGNOSIS — N186 End stage renal disease: Secondary | ICD-10-CM | POA: Diagnosis not present

## 2019-10-07 DIAGNOSIS — Z992 Dependence on renal dialysis: Secondary | ICD-10-CM | POA: Diagnosis not present

## 2019-10-09 DIAGNOSIS — N2581 Secondary hyperparathyroidism of renal origin: Secondary | ICD-10-CM | POA: Diagnosis not present

## 2019-10-09 DIAGNOSIS — N186 End stage renal disease: Secondary | ICD-10-CM | POA: Diagnosis not present

## 2019-10-09 DIAGNOSIS — Z992 Dependence on renal dialysis: Secondary | ICD-10-CM | POA: Diagnosis not present

## 2019-10-11 DIAGNOSIS — Z992 Dependence on renal dialysis: Secondary | ICD-10-CM | POA: Diagnosis not present

## 2019-10-11 DIAGNOSIS — N186 End stage renal disease: Secondary | ICD-10-CM | POA: Diagnosis not present

## 2019-10-11 DIAGNOSIS — N2581 Secondary hyperparathyroidism of renal origin: Secondary | ICD-10-CM | POA: Diagnosis not present

## 2019-10-14 DIAGNOSIS — N186 End stage renal disease: Secondary | ICD-10-CM | POA: Diagnosis not present

## 2019-10-14 DIAGNOSIS — N2581 Secondary hyperparathyroidism of renal origin: Secondary | ICD-10-CM | POA: Diagnosis not present

## 2019-10-14 DIAGNOSIS — Z992 Dependence on renal dialysis: Secondary | ICD-10-CM | POA: Diagnosis not present

## 2019-10-16 DIAGNOSIS — Z992 Dependence on renal dialysis: Secondary | ICD-10-CM | POA: Diagnosis not present

## 2019-10-16 DIAGNOSIS — N2581 Secondary hyperparathyroidism of renal origin: Secondary | ICD-10-CM | POA: Diagnosis not present

## 2019-10-16 DIAGNOSIS — N186 End stage renal disease: Secondary | ICD-10-CM | POA: Diagnosis not present

## 2019-10-18 DIAGNOSIS — N186 End stage renal disease: Secondary | ICD-10-CM | POA: Diagnosis not present

## 2019-10-18 DIAGNOSIS — N2581 Secondary hyperparathyroidism of renal origin: Secondary | ICD-10-CM | POA: Diagnosis not present

## 2019-10-18 DIAGNOSIS — Z992 Dependence on renal dialysis: Secondary | ICD-10-CM | POA: Diagnosis not present

## 2019-10-21 DIAGNOSIS — Z992 Dependence on renal dialysis: Secondary | ICD-10-CM | POA: Diagnosis not present

## 2019-10-21 DIAGNOSIS — N2581 Secondary hyperparathyroidism of renal origin: Secondary | ICD-10-CM | POA: Diagnosis not present

## 2019-10-21 DIAGNOSIS — N186 End stage renal disease: Secondary | ICD-10-CM | POA: Diagnosis not present

## 2019-10-23 DIAGNOSIS — Z992 Dependence on renal dialysis: Secondary | ICD-10-CM | POA: Diagnosis not present

## 2019-10-23 DIAGNOSIS — N2581 Secondary hyperparathyroidism of renal origin: Secondary | ICD-10-CM | POA: Diagnosis not present

## 2019-10-23 DIAGNOSIS — N186 End stage renal disease: Secondary | ICD-10-CM | POA: Diagnosis not present

## 2019-10-25 DIAGNOSIS — N2581 Secondary hyperparathyroidism of renal origin: Secondary | ICD-10-CM | POA: Diagnosis not present

## 2019-10-25 DIAGNOSIS — Z992 Dependence on renal dialysis: Secondary | ICD-10-CM | POA: Diagnosis not present

## 2019-10-25 DIAGNOSIS — N186 End stage renal disease: Secondary | ICD-10-CM | POA: Diagnosis not present

## 2019-10-28 DIAGNOSIS — N2581 Secondary hyperparathyroidism of renal origin: Secondary | ICD-10-CM | POA: Diagnosis not present

## 2019-10-28 DIAGNOSIS — N186 End stage renal disease: Secondary | ICD-10-CM | POA: Diagnosis not present

## 2019-10-28 DIAGNOSIS — Z992 Dependence on renal dialysis: Secondary | ICD-10-CM | POA: Diagnosis not present

## 2019-10-30 DIAGNOSIS — Z992 Dependence on renal dialysis: Secondary | ICD-10-CM | POA: Diagnosis not present

## 2019-10-30 DIAGNOSIS — N2581 Secondary hyperparathyroidism of renal origin: Secondary | ICD-10-CM | POA: Diagnosis not present

## 2019-10-30 DIAGNOSIS — N186 End stage renal disease: Secondary | ICD-10-CM | POA: Diagnosis not present

## 2019-11-01 DIAGNOSIS — N186 End stage renal disease: Secondary | ICD-10-CM | POA: Diagnosis not present

## 2019-11-01 DIAGNOSIS — Z992 Dependence on renal dialysis: Secondary | ICD-10-CM | POA: Diagnosis not present

## 2019-11-01 DIAGNOSIS — N2581 Secondary hyperparathyroidism of renal origin: Secondary | ICD-10-CM | POA: Diagnosis not present

## 2019-11-02 DIAGNOSIS — E1129 Type 2 diabetes mellitus with other diabetic kidney complication: Secondary | ICD-10-CM | POA: Diagnosis not present

## 2019-11-02 DIAGNOSIS — N186 End stage renal disease: Secondary | ICD-10-CM | POA: Diagnosis not present

## 2019-11-02 DIAGNOSIS — Z992 Dependence on renal dialysis: Secondary | ICD-10-CM | POA: Diagnosis not present

## 2019-11-04 DIAGNOSIS — N186 End stage renal disease: Secondary | ICD-10-CM | POA: Diagnosis not present

## 2019-11-04 DIAGNOSIS — Z992 Dependence on renal dialysis: Secondary | ICD-10-CM | POA: Diagnosis not present

## 2019-11-04 DIAGNOSIS — N2581 Secondary hyperparathyroidism of renal origin: Secondary | ICD-10-CM | POA: Diagnosis not present

## 2019-11-04 NOTE — Progress Notes (Signed)
HPI: FU CAD. Patient admitted to [facility] April 2020 with hypertensive urgency and congestive heart failure. Also with chest discomfort. Echocardiogram April 2020 showed ejection fraction 35 to 40%, moderate left ventricular enlargement, moderate left atrial enlargement, small pericardial effusion, moderate aortic stenosis. Cardiac catheterization revealed 50 to 60% mid LAD, apical 90% LAD, 75% first diagonal, 75% circumflex, occluded first marginal, 50% right coronary artery and 95% PDA. Mild aortic stenosis. Medical therapy recommended. Ptalso had baseline renal insufficiency and initial creatinine was 6.76. He was ultimately started on dialysis. Since he was last seen, he denies dyspnea, chest pain, palpitations or syncope.  He states his blood pressure is running high particularly prior to dialysis.  Current Outpatient Medications  Medication Sig Dispense Refill  . acetaminophen (TYLENOL) 325 MG tablet Take 325 mg by mouth every 6 (six) hours as needed (pain).     [name] Kitchen albuterol (VENTOLIN HFA) 108 (90 Base) MCG/ACT inhaler Inhale 2 puffs into the lungs 2 (two) times daily as needed for wheezing or shortness of breath. 8 g 0  . aspirin EC 81 MG EC tablet Take 1 tablet (81 mg total) by mouth daily. (Patient taking differently: Take 81 mg by mouth See admin instructions. Take one tablet (81 mg) by mouth with breakfast on Sunday, Monday, Wednesday, Friday; take one tablet (81 mg) with lunch on Tuesday, Thursday, Saturday (dialysis days)) 30 tablet 0  . hydrALAZINE (APRESOLINE) 25 MG tablet Take 1 tablet (25 mg total) by mouth every 8 (eight) hours. (Patient taking differently: Take 25 mg by mouth daily. ) 90 tablet 0  . isosorbide mononitrate (IMDUR) 30 MG 24 hr tablet Take 1 tablet (30 mg total) by mouth daily. (Patient taking differently: Take 30 mg by mouth See admin instructions. Take one tablet (30 mg) by mouth with breakfast on Sunday, Monday, Wednesday, Friday; take one tablet  (30 mg) with lunch on Tuesday, Thursday, Saturday (dialysis days)) 30 tablet 0  . NOVOLIN 70/30 RELION (70-30) 100 UNIT/ML injection Inject 20 Units into the skin daily. (Patient taking differently: Inject 20 Units into the skin See admin instructions. Inject 20 units subcutaneously with breakfast on Sunday, Monday, Wednesday, Friday; inject 20 units with lunch on Tuesday, Thursday, Saturday (dialysis days)) 10 mL 0  . oxyCODONE (ROXICODONE) 5 MG immediate release tablet Take 1 tablet (5 mg total) by mouth every 6 (six) hours as needed. (Patient taking differently: Take 5 mg by mouth every 6 (six) hours as needed (pain). ) 6 tablet 0  . rosuvastatin (CRESTOR) 40 MG tablet Take 1 tablet (40 mg total) by mouth daily. (Patient taking differently: Take 40 mg by mouth See admin instructions. Take one tablet (40 mg) by mouth with breakfast on Sunday, Monday, Wednesday, Friday; take one tablet (40 mg) with lunch on Tuesday, Thursday, Saturday (dialysis days)) 90 tablet 3   No current facility-administered medications for this visit.     Past Medical History:  Diagnosis Date  . BPH (benign prostatic hyperplasia)   . Chronic kidney disease (CKD), stage IV (severe) ([place])   . Diabetes ([place])   . Dysuria   . Erectile dysfunction   . Hyperlipemia   . Hypertension   . Pinna disorder, left   . Renal disorder   . Wears glasses     Past Surgical History:  Procedure Laterality Date  . AV FISTULA PLACEMENT Left 04/30/2018   Procedure: CREATION OF RADIOCEPHALIC ARTERIOVENOUS FISTULA LEFT ARM;  Surgeon: Elam Dutch, MD;  Location: [place];  Service: Vascular;  Laterality: Left;  . AV FISTULA PLACEMENT Left 08/28/2018   Procedure: ARTERIOVENOUS (AV) BRACHIOCEPHALIC FISTULA CREATION LEFT UPPER ARM;  Surgeon: Marty Heck, MD;  Location: [place];  Service: Vascular;  Laterality: Left;  . IR FLUORO GUIDE CV LINE RIGHT  04/29/2018  . IR US GUIDE VASC ACCESS RIGHT  04/29/2018  . RIGHT/LEFT HEART CATH AND  CORONARY ANGIOGRAPHY N/A 05/01/2018   Procedure: RIGHT/LEFT HEART CATH AND CORONARY ANGIOGRAPHY;  Surgeon: Belva Crome, MD;  Location: [place] [facility];  Service: Cardiovascular;  Laterality: N/A;    Social History   Socioeconomic History  . Marital status: Divorced    Spouse name: Not on file  . Number of children: Not on file  . Years of education: Not on file  . Highest education level: Not on file  Occupational History  . Not on file  Tobacco Use  . Smoking status: Never Smoker  . Smokeless tobacco: Never Used  Substance and Sexual Activity  . Alcohol use: Not Currently  . Drug use: Never  . Sexual activity: Not Currently  Other Topics Concern  . Not on file  Social History Narrative  . Not on file   Social Determinants of Health   Financial Resource Strain:   . Difficulty of Paying Living Expenses: Not on file  Food Insecurity:   . Worried About Charity fundraiser in the Last Year: Not on file  . Ran Out of Food in the Last Year: Not on file  Transportation Needs:   . Lack of Transportation (Medical): Not on file  . Lack of Transportation (Non-Medical): Not on file  Physical Activity:   . Days of Exercise per Week: Not on file  . Minutes of Exercise per Session: Not on file  Stress:   . Feeling of Stress : Not on file  Social Connections:   . Frequency of Communication with Friends and Family: Not on file  . Frequency of Social Gatherings with Friends and Family: Not on file  . Attends Religious Services: Not on file  . Active Member of Clubs or Organizations: Not on file  . Attends Archivist Meetings: Not on file  . Marital Status: Not on file  Intimate Partner Violence:   . Fear of Current or Ex-Partner: Not on file  . Emotionally Abused: Not on file  . Physically Abused: Not on file  . Sexually Abused: Not on file    Family History  Problem Relation Age of Onset  . Stroke Mother   . Diabetes Father     ROS: no fevers or chills,  productive cough, hemoptysis, dysphasia, odynophagia, melena, hematochezia, dysuria, hematuria, rash, seizure activity, orthopnea, PND, pedal edema, claudication. Remaining systems are negative.  Physical Exam: Well-developed well-nourished in no acute distress.  Skin is warm and dry.  HEENT is normal.  Neck is supple.  Bilateral bruits Chest is clear to auscultation with normal expansion.  Cardiovascular exam is regular rate and rhythm.  2/6 systolic murmur left sternal border. Abdominal exam nontender or distended. No masses palpated. Extremities show no edema. neuro grossly intact  ECG-sinus bradycardia, first-degree AV block, nonspecific ST changes.  Personally reviewed  A/P  1 coronary artery disease-patient denies chest pain.  Continue aspirin and statin.  2 aortic stenosis-plan follow-up echocardiogram.  3 cardiomyopathy-this is felt to be hypertensive mediated.  Continue beta-blocker; resume hydralazine and continue nitrates.  Repeat echocardiogram.  4 hypertension-blood pressure elevated.  However he is not taking hydralazine.  We will resume at 25 mg 3 times daily and increase as needed.  5 hyperlipidemia-continue statin.   6 chronic combined systolic/diastolic congestive heart failure-volume management per dialysis.  7 end-stage renal disease  8 carotid bruit-schedule carotid Dopplers to further assess.  Kirk Ruths, MD

## 2019-11-05 DIAGNOSIS — Z1211 Encounter for screening for malignant neoplasm of colon: Secondary | ICD-10-CM | POA: Diagnosis not present

## 2019-11-06 DIAGNOSIS — Z992 Dependence on renal dialysis: Secondary | ICD-10-CM | POA: Diagnosis not present

## 2019-11-06 DIAGNOSIS — N2581 Secondary hyperparathyroidism of renal origin: Secondary | ICD-10-CM | POA: Diagnosis not present

## 2019-11-06 DIAGNOSIS — N186 End stage renal disease: Secondary | ICD-10-CM | POA: Diagnosis not present

## 2019-11-08 DIAGNOSIS — Z992 Dependence on renal dialysis: Secondary | ICD-10-CM | POA: Diagnosis not present

## 2019-11-08 DIAGNOSIS — N186 End stage renal disease: Secondary | ICD-10-CM | POA: Diagnosis not present

## 2019-11-08 DIAGNOSIS — N2581 Secondary hyperparathyroidism of renal origin: Secondary | ICD-10-CM | POA: Diagnosis not present

## 2019-11-11 DIAGNOSIS — N186 End stage renal disease: Secondary | ICD-10-CM | POA: Diagnosis not present

## 2019-11-11 DIAGNOSIS — Z992 Dependence on renal dialysis: Secondary | ICD-10-CM | POA: Diagnosis not present

## 2019-11-11 DIAGNOSIS — N2581 Secondary hyperparathyroidism of renal origin: Secondary | ICD-10-CM | POA: Diagnosis not present

## 2019-11-12 ENCOUNTER — Other Ambulatory Visit: Payer: Self-pay

## 2019-11-12 ENCOUNTER — Ambulatory Visit (INDEPENDENT_AMBULATORY_CARE_PROVIDER_SITE_OTHER): Payer: Medicare HMO | Admitting: Cardiology

## 2019-11-12 ENCOUNTER — Encounter: Payer: Self-pay | Admitting: Cardiology

## 2019-11-12 VITALS — BP 170/62 | HR 64 | Ht 68.0 in | Wt 192.0 lb

## 2019-11-12 DIAGNOSIS — I2510 Atherosclerotic heart disease of native coronary artery without angina pectoris: Secondary | ICD-10-CM

## 2019-11-12 DIAGNOSIS — I350 Nonrheumatic aortic (valve) stenosis: Secondary | ICD-10-CM

## 2019-11-12 DIAGNOSIS — I10 Essential (primary) hypertension: Secondary | ICD-10-CM | POA: Diagnosis not present

## 2019-11-12 DIAGNOSIS — R0989 Other specified symptoms and signs involving the circulatory and respiratory systems: Secondary | ICD-10-CM | POA: Diagnosis not present

## 2019-11-12 DIAGNOSIS — I420 Dilated cardiomyopathy: Secondary | ICD-10-CM | POA: Diagnosis not present

## 2019-11-12 MED ORDER — METOPROLOL SUCCINATE ER 25 MG PO TB24: 12.5000 mg | ORAL_TABLET | Freq: Every day | ORAL | 3 refills | Status: DC

## 2019-11-12 MED ORDER — HYDRALAZINE HCL 25 MG PO TABS: 25.0000 mg | ORAL_TABLET | Freq: Three times a day (TID) | ORAL | 3 refills | Status: DC

## 2019-11-12 NOTE — Patient Instructions (Signed)
Medication Instructions:   START HYDRALAZINE 25 MG ONE TABLET THREE TIMES DAILY  *If you need a refill on your cardiac medications before your next appointment, please call your pharmacy*   Testing/Procedures:  Your physician has requested that you have an echocardiogram. Echocardiography is a painless test that uses sound waves to create images of your heart. It provides your doctor with information about the size and shape of your heart and how well your heart's chambers and valves are working. This procedure takes approximately one hour. There are no restrictions for this procedure.[place] has requested that you have a carotid duplex. This test is an ultrasound of the carotid arteries in your neck. It looks at blood flow through these arteries that supply the brain with blood. Allow one hour for this exam. There are no restrictions or special instructions.[place] OFFICE     Follow-Up: At [facility], you and your health needs are our priority.  As part of our continuing mission to provide you with exceptional heart care, we have created designated Provider Care Teams.  These Care Teams include your primary Cardiologist (physician) and Advanced Practice Providers (APPs -  Physician Assistants and Nurse Practitioners) who all work together to provide you with the care you need, when you need it.  We recommend signing up for the patient portal called "MyChart".  Sign up information is provided on this After Visit Summary.  MyChart is used to connect with patients for Virtual Visits (Telemedicine).  Patients are able to view lab/test results, encounter notes, upcoming appointments, etc.  Non-urgent messages can be sent to your provider as well.   To learn more about what you can do with MyChart, go to [email or web address].    Your next appointment:   6 month(s)  The format for your next appointment:   In Person  Provider:   Kirk Ruths, MD

## 2019-11-13 DIAGNOSIS — N186 End stage renal disease: Secondary | ICD-10-CM | POA: Diagnosis not present

## 2019-11-13 DIAGNOSIS — Z992 Dependence on renal dialysis: Secondary | ICD-10-CM | POA: Diagnosis not present

## 2019-11-13 DIAGNOSIS — N2581 Secondary hyperparathyroidism of renal origin: Secondary | ICD-10-CM | POA: Diagnosis not present

## 2019-11-15 DIAGNOSIS — N2581 Secondary hyperparathyroidism of renal origin: Secondary | ICD-10-CM | POA: Diagnosis not present

## 2019-11-15 DIAGNOSIS — N186 End stage renal disease: Secondary | ICD-10-CM | POA: Diagnosis not present

## 2019-11-15 DIAGNOSIS — Z992 Dependence on renal dialysis: Secondary | ICD-10-CM | POA: Diagnosis not present

## 2019-11-18 DIAGNOSIS — N186 End stage renal disease: Secondary | ICD-10-CM | POA: Diagnosis not present

## 2019-11-18 DIAGNOSIS — N2581 Secondary hyperparathyroidism of renal origin: Secondary | ICD-10-CM | POA: Diagnosis not present

## 2019-11-18 DIAGNOSIS — Z992 Dependence on renal dialysis: Secondary | ICD-10-CM | POA: Diagnosis not present

## 2019-11-19 ENCOUNTER — Encounter (HOSPITAL_COMMUNITY): Payer: Medicare HMO

## 2019-11-19 DIAGNOSIS — I2510 Atherosclerotic heart disease of native coronary artery without angina pectoris: Secondary | ICD-10-CM

## 2019-11-19 DIAGNOSIS — N529 Male erectile dysfunction, unspecified: Secondary | ICD-10-CM | POA: Insufficient documentation

## 2019-11-19 DIAGNOSIS — Z8679 Personal history of other diseases of the circulatory system: Secondary | ICD-10-CM

## 2019-11-19 DIAGNOSIS — Z8616 Personal history of COVID-19: Secondary | ICD-10-CM | POA: Insufficient documentation

## 2019-11-19 DIAGNOSIS — I10 Essential (primary) hypertension: Secondary | ICD-10-CM | POA: Insufficient documentation

## 2019-11-19 DIAGNOSIS — N186 End stage renal disease: Secondary | ICD-10-CM | POA: Insufficient documentation

## 2019-11-19 HISTORY — DX: Personal history of other diseases of the circulatory system: Z86.79

## 2019-11-19 HISTORY — DX: Atherosclerotic heart disease of native coronary artery without angina pectoris: I25.10

## 2019-11-19 HISTORY — DX: Personal history of COVID-19: Z86.16

## 2019-11-20 ENCOUNTER — Ambulatory Visit (HOSPITAL_COMMUNITY)
Admission: RE | Admit: 2019-11-20 | Discharge: 2019-11-20 | Disposition: A | Discharge status: Home / Self Care | Payer: Medicare HMO | Source: Ambulatory Visit | Attending: Cardiovascular Disease | Admitting: Cardiovascular Disease

## 2019-11-20 DIAGNOSIS — N2581 Secondary hyperparathyroidism of renal origin: Secondary | ICD-10-CM | POA: Diagnosis not present

## 2019-11-20 DIAGNOSIS — R0989 Other specified symptoms and signs involving the circulatory and respiratory systems: Secondary | ICD-10-CM | POA: Diagnosis not present

## 2019-11-20 DIAGNOSIS — N186 End stage renal disease: Secondary | ICD-10-CM | POA: Diagnosis not present

## 2019-11-20 DIAGNOSIS — Z992 Dependence on renal dialysis: Secondary | ICD-10-CM | POA: Diagnosis not present

## 2019-11-21 ENCOUNTER — Encounter: Payer: Self-pay | Admitting: *Deleted

## 2019-11-22 DIAGNOSIS — Z992 Dependence on renal dialysis: Secondary | ICD-10-CM | POA: Diagnosis not present

## 2019-11-22 DIAGNOSIS — N2581 Secondary hyperparathyroidism of renal origin: Secondary | ICD-10-CM | POA: Diagnosis not present

## 2019-11-22 DIAGNOSIS — N186 End stage renal disease: Secondary | ICD-10-CM | POA: Diagnosis not present

## 2019-11-24 DIAGNOSIS — Z01818 Encounter for other preprocedural examination: Secondary | ICD-10-CM | POA: Diagnosis not present

## 2019-11-24 DIAGNOSIS — Z1211 Encounter for screening for malignant neoplasm of colon: Secondary | ICD-10-CM | POA: Diagnosis not present

## 2019-11-26 DIAGNOSIS — N186 End stage renal disease: Secondary | ICD-10-CM | POA: Diagnosis not present

## 2019-11-26 DIAGNOSIS — Z992 Dependence on renal dialysis: Secondary | ICD-10-CM | POA: Diagnosis not present

## 2019-11-26 DIAGNOSIS — N2581 Secondary hyperparathyroidism of renal origin: Secondary | ICD-10-CM | POA: Diagnosis not present

## 2019-11-29 DIAGNOSIS — Z992 Dependence on renal dialysis: Secondary | ICD-10-CM | POA: Diagnosis not present

## 2019-11-29 DIAGNOSIS — N2581 Secondary hyperparathyroidism of renal origin: Secondary | ICD-10-CM | POA: Diagnosis not present

## 2019-11-29 DIAGNOSIS — N186 End stage renal disease: Secondary | ICD-10-CM | POA: Diagnosis not present

## 2019-12-02 DIAGNOSIS — N186 End stage renal disease: Secondary | ICD-10-CM | POA: Diagnosis not present

## 2019-12-02 DIAGNOSIS — Z992 Dependence on renal dialysis: Secondary | ICD-10-CM | POA: Diagnosis not present

## 2019-12-02 DIAGNOSIS — E1129 Type 2 diabetes mellitus with other diabetic kidney complication: Secondary | ICD-10-CM | POA: Diagnosis not present

## 2019-12-02 DIAGNOSIS — N2581 Secondary hyperparathyroidism of renal origin: Secondary | ICD-10-CM | POA: Diagnosis not present

## 2019-12-03 ENCOUNTER — Other Ambulatory Visit (HOSPITAL_COMMUNITY): Payer: Medicare HMO

## 2019-12-03 ENCOUNTER — Encounter (HOSPITAL_COMMUNITY): Payer: Self-pay | Admitting: Cardiology

## 2019-12-03 NOTE — Progress Notes (Unsigned)
Patient ID: Justin [name], male   DOB: [birth year], 69 y.o.   MRN: [id number]   Verified appointment "no show" status with Elmo Putt at 6:92.

## 2019-12-04 DIAGNOSIS — Z992 Dependence on renal dialysis: Secondary | ICD-10-CM | POA: Diagnosis not present

## 2019-12-04 DIAGNOSIS — N2581 Secondary hyperparathyroidism of renal origin: Secondary | ICD-10-CM | POA: Diagnosis not present

## 2019-12-04 DIAGNOSIS — N186 End stage renal disease: Secondary | ICD-10-CM | POA: Diagnosis not present

## 2019-12-05 DIAGNOSIS — K635 Polyp of colon: Secondary | ICD-10-CM | POA: Diagnosis not present

## 2019-12-06 DIAGNOSIS — N2581 Secondary hyperparathyroidism of renal origin: Secondary | ICD-10-CM | POA: Diagnosis not present

## 2019-12-06 DIAGNOSIS — N186 End stage renal disease: Secondary | ICD-10-CM | POA: Diagnosis not present

## 2019-12-06 DIAGNOSIS — Z992 Dependence on renal dialysis: Secondary | ICD-10-CM | POA: Diagnosis not present

## 2019-12-09 DIAGNOSIS — Z992 Dependence on renal dialysis: Secondary | ICD-10-CM | POA: Diagnosis not present

## 2019-12-09 DIAGNOSIS — N186 End stage renal disease: Secondary | ICD-10-CM | POA: Diagnosis not present

## 2019-12-09 DIAGNOSIS — N2581 Secondary hyperparathyroidism of renal origin: Secondary | ICD-10-CM | POA: Diagnosis not present

## 2019-12-11 DIAGNOSIS — N186 End stage renal disease: Secondary | ICD-10-CM | POA: Diagnosis not present

## 2019-12-11 DIAGNOSIS — Z992 Dependence on renal dialysis: Secondary | ICD-10-CM | POA: Diagnosis not present

## 2019-12-11 DIAGNOSIS — N2581 Secondary hyperparathyroidism of renal origin: Secondary | ICD-10-CM | POA: Diagnosis not present

## 2019-12-13 DIAGNOSIS — N186 End stage renal disease: Secondary | ICD-10-CM | POA: Diagnosis not present

## 2019-12-13 DIAGNOSIS — N2581 Secondary hyperparathyroidism of renal origin: Secondary | ICD-10-CM | POA: Diagnosis not present

## 2019-12-13 DIAGNOSIS — Z992 Dependence on renal dialysis: Secondary | ICD-10-CM | POA: Diagnosis not present

## 2019-12-15 ENCOUNTER — Telehealth (HOSPITAL_COMMUNITY): Payer: Self-pay | Admitting: Cardiology

## 2019-12-15 NOTE — Telephone Encounter (Signed)
Forward - for your information

## 2019-12-15 NOTE — Telephone Encounter (Signed)
Just an FYI. We have made several attempts to contact this patient including sending a letter to schedule or reschedule their echocardiogram. We will be removing the patient from the echo WQ.     12/03/19 NO SHOW -Mailed letter/[facility]   Thank you

## 2019-12-16 DIAGNOSIS — N186 End stage renal disease: Secondary | ICD-10-CM | POA: Diagnosis not present

## 2019-12-16 DIAGNOSIS — N2581 Secondary hyperparathyroidism of renal origin: Secondary | ICD-10-CM | POA: Diagnosis not present

## 2019-12-16 DIAGNOSIS — Z992 Dependence on renal dialysis: Secondary | ICD-10-CM | POA: Diagnosis not present

## 2019-12-18 DIAGNOSIS — N186 End stage renal disease: Secondary | ICD-10-CM | POA: Diagnosis not present

## 2019-12-18 DIAGNOSIS — Z992 Dependence on renal dialysis: Secondary | ICD-10-CM | POA: Diagnosis not present

## 2019-12-18 DIAGNOSIS — N2581 Secondary hyperparathyroidism of renal origin: Secondary | ICD-10-CM | POA: Diagnosis not present

## 2019-12-20 DIAGNOSIS — N2581 Secondary hyperparathyroidism of renal origin: Secondary | ICD-10-CM | POA: Diagnosis not present

## 2019-12-20 DIAGNOSIS — Z992 Dependence on renal dialysis: Secondary | ICD-10-CM | POA: Diagnosis not present

## 2019-12-20 DIAGNOSIS — N186 End stage renal disease: Secondary | ICD-10-CM | POA: Diagnosis not present

## 2019-12-23 DIAGNOSIS — N2581 Secondary hyperparathyroidism of renal origin: Secondary | ICD-10-CM | POA: Diagnosis not present

## 2019-12-23 DIAGNOSIS — Z992 Dependence on renal dialysis: Secondary | ICD-10-CM | POA: Diagnosis not present

## 2019-12-23 DIAGNOSIS — N186 End stage renal disease: Secondary | ICD-10-CM | POA: Diagnosis not present

## 2019-12-25 DIAGNOSIS — N2581 Secondary hyperparathyroidism of renal origin: Secondary | ICD-10-CM | POA: Diagnosis not present

## 2019-12-25 DIAGNOSIS — Z992 Dependence on renal dialysis: Secondary | ICD-10-CM | POA: Diagnosis not present

## 2019-12-25 DIAGNOSIS — N186 End stage renal disease: Secondary | ICD-10-CM | POA: Diagnosis not present

## 2019-12-28 DIAGNOSIS — Z992 Dependence on renal dialysis: Secondary | ICD-10-CM | POA: Diagnosis not present

## 2019-12-28 DIAGNOSIS — N2581 Secondary hyperparathyroidism of renal origin: Secondary | ICD-10-CM | POA: Diagnosis not present

## 2019-12-28 DIAGNOSIS — N186 End stage renal disease: Secondary | ICD-10-CM | POA: Diagnosis not present

## 2019-12-30 DIAGNOSIS — N2581 Secondary hyperparathyroidism of renal origin: Secondary | ICD-10-CM | POA: Diagnosis not present

## 2019-12-30 DIAGNOSIS — N186 End stage renal disease: Secondary | ICD-10-CM | POA: Diagnosis not present

## 2019-12-30 DIAGNOSIS — Z992 Dependence on renal dialysis: Secondary | ICD-10-CM | POA: Diagnosis not present

## 2020-01-01 DIAGNOSIS — Z992 Dependence on renal dialysis: Secondary | ICD-10-CM | POA: Diagnosis not present

## 2020-01-01 DIAGNOSIS — N186 End stage renal disease: Secondary | ICD-10-CM | POA: Diagnosis not present

## 2020-01-01 DIAGNOSIS — N2581 Secondary hyperparathyroidism of renal origin: Secondary | ICD-10-CM | POA: Diagnosis not present

## 2020-01-02 DIAGNOSIS — Z992 Dependence on renal dialysis: Secondary | ICD-10-CM | POA: Diagnosis not present

## 2020-01-02 DIAGNOSIS — E1129 Type 2 diabetes mellitus with other diabetic kidney complication: Secondary | ICD-10-CM | POA: Diagnosis not present

## 2020-01-02 DIAGNOSIS — N186 End stage renal disease: Secondary | ICD-10-CM | POA: Diagnosis not present

## 2020-01-04 DIAGNOSIS — N186 End stage renal disease: Secondary | ICD-10-CM | POA: Diagnosis not present

## 2020-01-04 DIAGNOSIS — Z992 Dependence on renal dialysis: Secondary | ICD-10-CM | POA: Diagnosis not present

## 2020-01-04 DIAGNOSIS — N2581 Secondary hyperparathyroidism of renal origin: Secondary | ICD-10-CM | POA: Diagnosis not present

## 2020-01-06 DIAGNOSIS — Z992 Dependence on renal dialysis: Secondary | ICD-10-CM | POA: Diagnosis not present

## 2020-01-06 DIAGNOSIS — N186 End stage renal disease: Secondary | ICD-10-CM | POA: Diagnosis not present

## 2020-01-06 DIAGNOSIS — N2581 Secondary hyperparathyroidism of renal origin: Secondary | ICD-10-CM | POA: Diagnosis not present

## 2020-01-10 DIAGNOSIS — N186 End stage renal disease: Secondary | ICD-10-CM | POA: Diagnosis not present

## 2020-01-10 DIAGNOSIS — Z992 Dependence on renal dialysis: Secondary | ICD-10-CM | POA: Diagnosis not present

## 2020-01-10 DIAGNOSIS — N2581 Secondary hyperparathyroidism of renal origin: Secondary | ICD-10-CM | POA: Diagnosis not present

## 2020-01-13 DIAGNOSIS — N186 End stage renal disease: Secondary | ICD-10-CM | POA: Diagnosis not present

## 2020-01-13 DIAGNOSIS — Z992 Dependence on renal dialysis: Secondary | ICD-10-CM | POA: Diagnosis not present

## 2020-01-13 DIAGNOSIS — N2581 Secondary hyperparathyroidism of renal origin: Secondary | ICD-10-CM | POA: Diagnosis not present

## 2020-01-14 DIAGNOSIS — N186 End stage renal disease: Secondary | ICD-10-CM | POA: Diagnosis not present

## 2020-01-14 DIAGNOSIS — Z992 Dependence on renal dialysis: Secondary | ICD-10-CM | POA: Diagnosis not present

## 2020-01-14 DIAGNOSIS — T82858A Stenosis of vascular prosthetic devices, implants and grafts, initial encounter: Secondary | ICD-10-CM | POA: Diagnosis not present

## 2020-01-14 DIAGNOSIS — I871 Compression of vein: Secondary | ICD-10-CM | POA: Diagnosis not present

## 2020-01-15 DIAGNOSIS — N2581 Secondary hyperparathyroidism of renal origin: Secondary | ICD-10-CM | POA: Diagnosis not present

## 2020-01-15 DIAGNOSIS — Z992 Dependence on renal dialysis: Secondary | ICD-10-CM | POA: Diagnosis not present

## 2020-01-15 DIAGNOSIS — N186 End stage renal disease: Secondary | ICD-10-CM | POA: Diagnosis not present

## 2020-01-17 DIAGNOSIS — N2581 Secondary hyperparathyroidism of renal origin: Secondary | ICD-10-CM | POA: Diagnosis not present

## 2020-01-17 DIAGNOSIS — N186 End stage renal disease: Secondary | ICD-10-CM | POA: Diagnosis not present

## 2020-01-17 DIAGNOSIS — Z992 Dependence on renal dialysis: Secondary | ICD-10-CM | POA: Diagnosis not present

## 2020-01-20 IMAGING — CT CT ANGIO CHEST
2 of 6 series · 19 of 46 positions shown · IV contrast (omnipaque)
Comparison: None.

CLINICAL DATA: Hypoxemia.  COVID positive.  Elevated D-dimer.

EXAM:
CT ANGIOGRAPHY CHEST WITH CONTRAST
TECHNIQUE: Multidetector CT imaging of the chest was performed using the
standard protocol during bolus administration of intravenous
contrast. Multiplanar CT image reconstructions and MIPs were
obtained to evaluate the vascular anatomy.
CONTRAST:  100mL OMNIPAQUE IOHEXOL 350 MG/ML SOLN

[Series 7: thins · axial · 0.70mm/px · z∈[-323,-12]mm · 16 of 341 slices shown]
[im 15/341  lung]
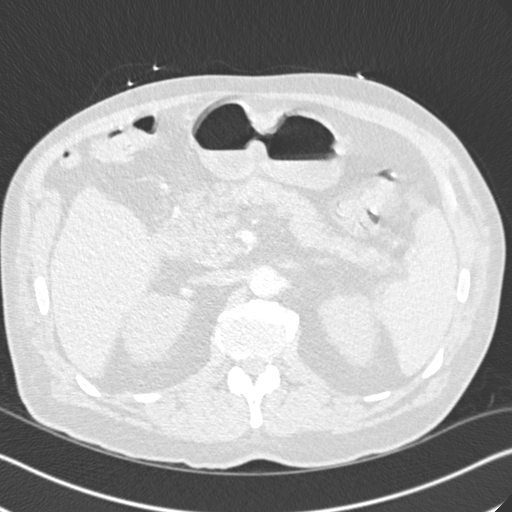
[im 45/341  soft-tissue]
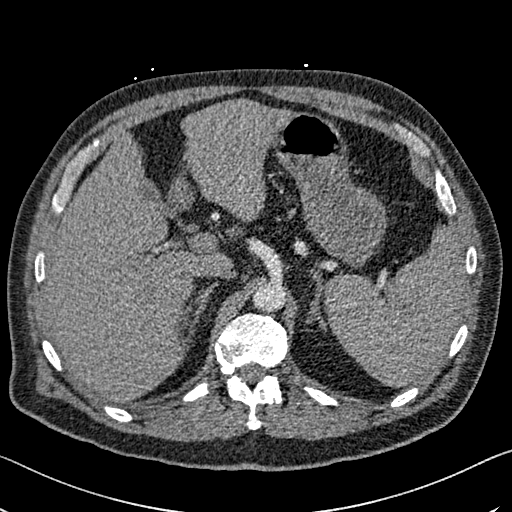
[im 60/341  lung]
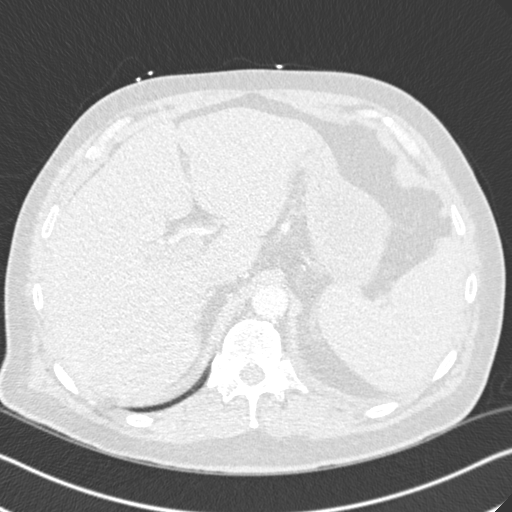
[im 74/341  soft-tissue]
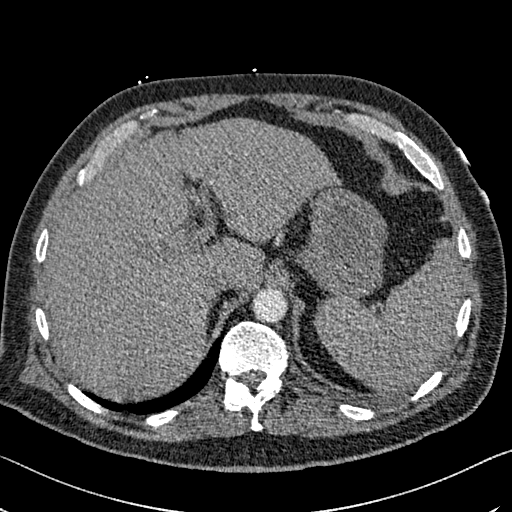
[im 104/341  lung]
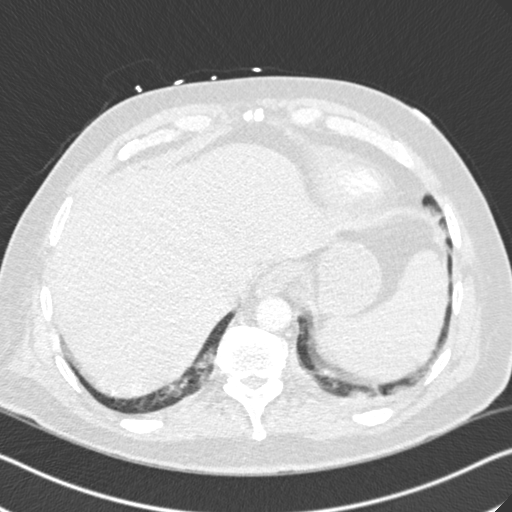
[im 119/341  soft-tissue]
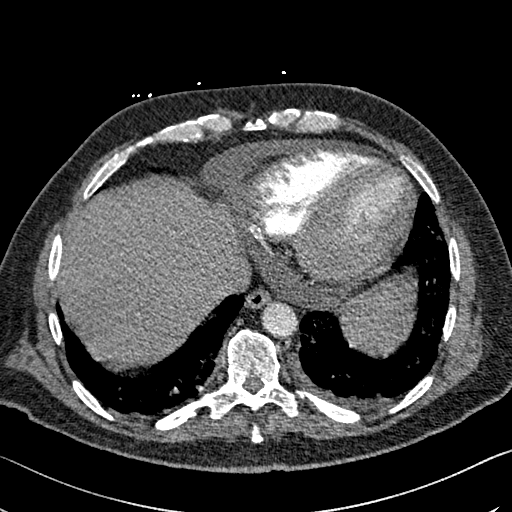
[im 134/341  lung]
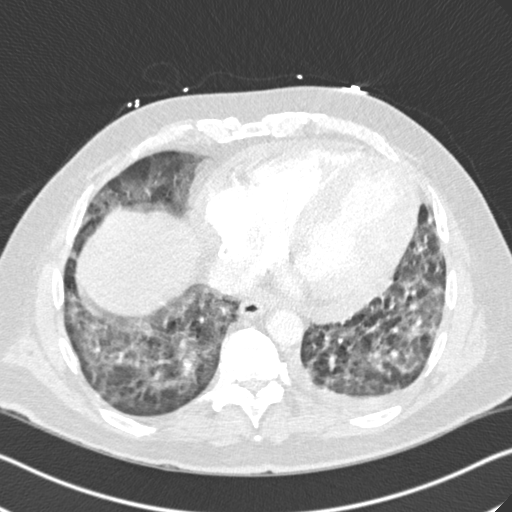
[im 163/341  soft-tissue]
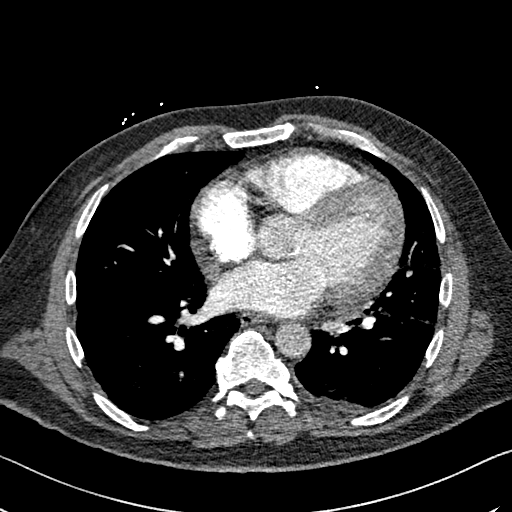
[im 178/341  lung]
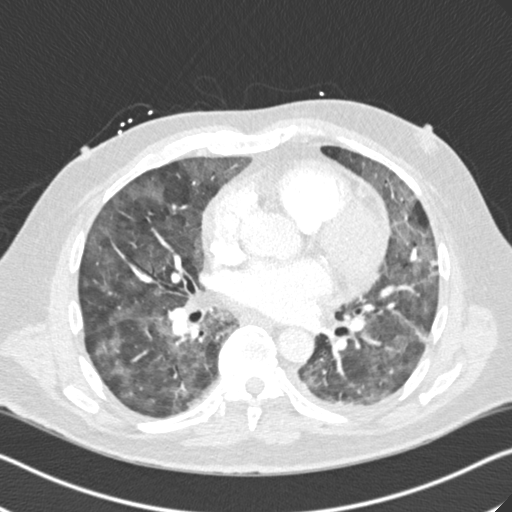
[im 207/341  soft-tissue]
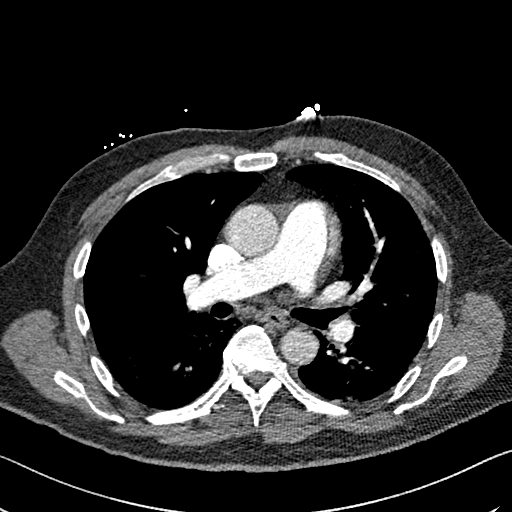
[im 222/341  lung]
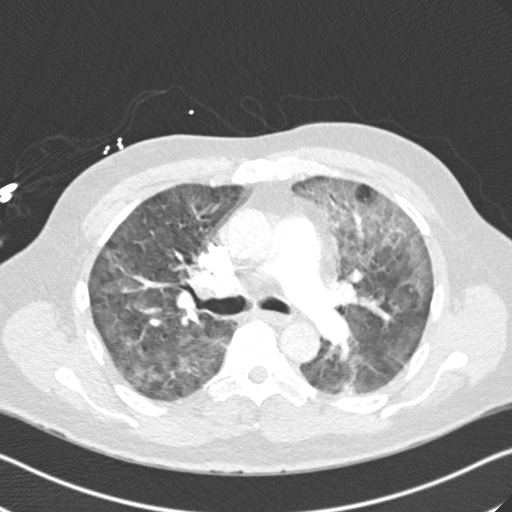
[im 237/341  soft-tissue]
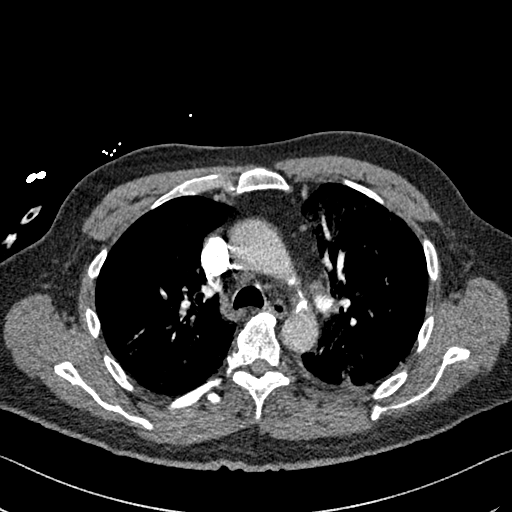
[im 267/341  lung]
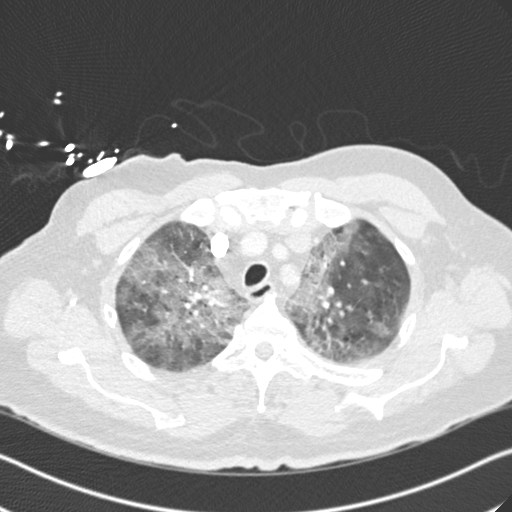
[im 281/341  soft-tissue]
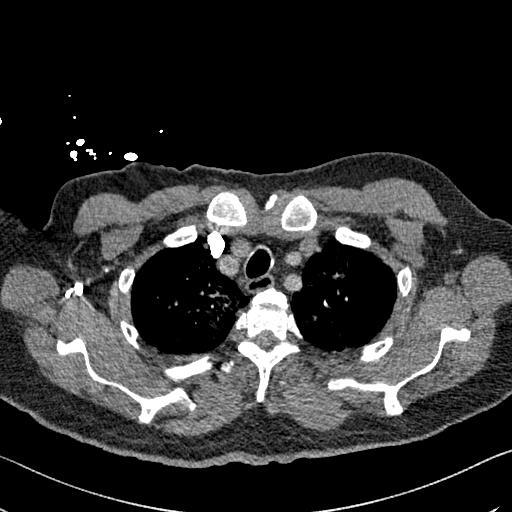
[im 296/341  lung]
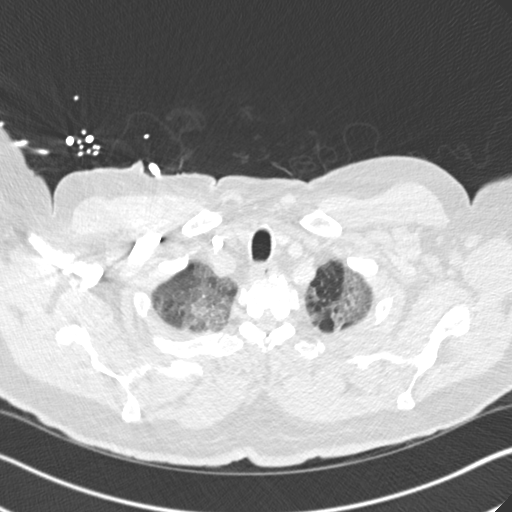
[im 326/341  soft-tissue]
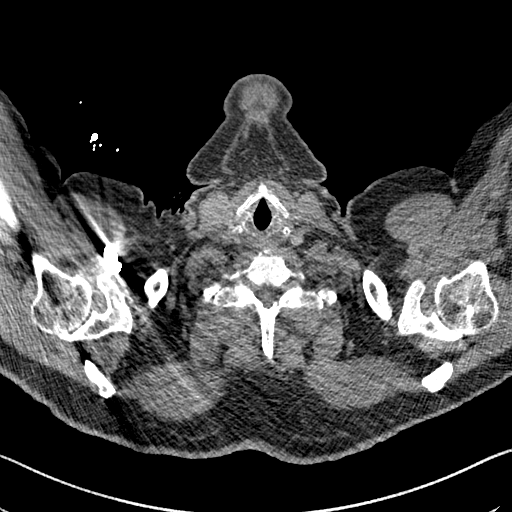

[Series 8: coronal mpr · coronal · 0.66mm/px · 3 of 142 slices shown]
[im 36/142  soft-tissue]
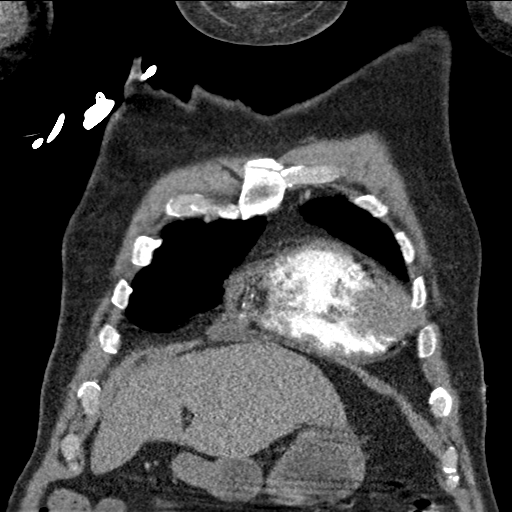
[im 71/142  soft-tissue]
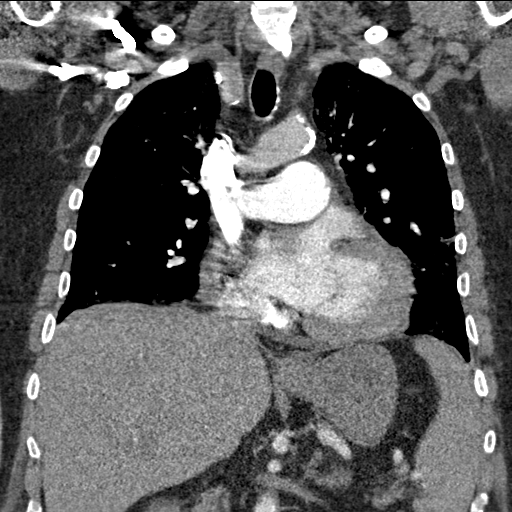
[im 106/142  soft-tissue]
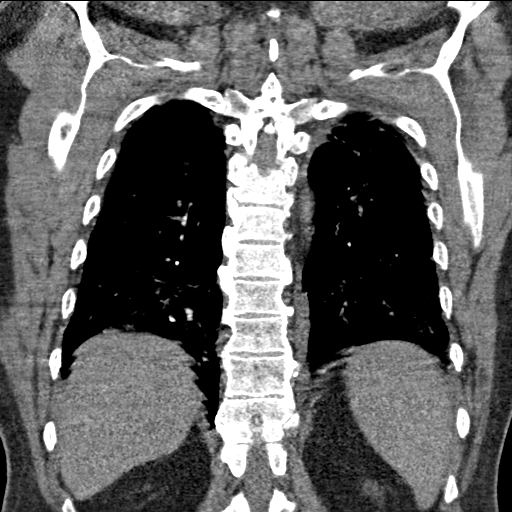

[19 of 46 positions shown; findings below may reference images not displayed]

FINDINGS: Cardiovascular: No filling defects in the pulmonary arteries to
suggest pulmonary emboli. Heart is borderline in size. Diffusely
calcified coronary arteries. Aortic atherosclerosis. No aneurysm.
Small to medium size pericardial effusion.

Mediastinum/Nodes: No mediastinal, hilar, or axillary adenopathy.
Trachea and esophagus are unremarkable. Thyroid unremarkable.

Lungs/Pleura: Small left pleural effusion. Ground-glass airspace
disease throughout both lungs most compatible with COVID pneumonia.

Upper Abdomen: Imaging into the upper abdomen shows no acute
findings.

Musculoskeletal: Chest wall soft tissues are unremarkable. No acute
bony abnormality.

Review of the MIP images confirms the above findings.
IMPRESSION: No evidence of pulmonary embolus.

Extensive ground-glass airspace disease throughout the lungs most
compatible with COVID pneumonia.

Mild cardiomegaly.  Small to medium size pericardial effusion.

Extensive coronary artery disease.

Small left pleural effusion.

Aortic Atherosclerosis ([id number]).

## 2020-01-22 DIAGNOSIS — N186 End stage renal disease: Secondary | ICD-10-CM | POA: Diagnosis not present

## 2020-01-22 DIAGNOSIS — N2581 Secondary hyperparathyroidism of renal origin: Secondary | ICD-10-CM | POA: Diagnosis not present

## 2020-01-22 DIAGNOSIS — Z992 Dependence on renal dialysis: Secondary | ICD-10-CM | POA: Diagnosis not present

## 2020-01-27 DIAGNOSIS — Z992 Dependence on renal dialysis: Secondary | ICD-10-CM | POA: Diagnosis not present

## 2020-01-27 DIAGNOSIS — N186 End stage renal disease: Secondary | ICD-10-CM | POA: Diagnosis not present

## 2020-01-27 DIAGNOSIS — N2581 Secondary hyperparathyroidism of renal origin: Secondary | ICD-10-CM | POA: Diagnosis not present

## 2020-01-29 DIAGNOSIS — N186 End stage renal disease: Secondary | ICD-10-CM | POA: Diagnosis not present

## 2020-01-29 DIAGNOSIS — N2581 Secondary hyperparathyroidism of renal origin: Secondary | ICD-10-CM | POA: Diagnosis not present

## 2020-01-29 DIAGNOSIS — Z992 Dependence on renal dialysis: Secondary | ICD-10-CM | POA: Diagnosis not present

## 2020-02-02 DIAGNOSIS — N186 End stage renal disease: Secondary | ICD-10-CM | POA: Diagnosis not present

## 2020-02-02 DIAGNOSIS — Z992 Dependence on renal dialysis: Secondary | ICD-10-CM | POA: Diagnosis not present

## 2020-02-02 DIAGNOSIS — E1129 Type 2 diabetes mellitus with other diabetic kidney complication: Secondary | ICD-10-CM | POA: Diagnosis not present

## 2020-02-03 DIAGNOSIS — N186 End stage renal disease: Secondary | ICD-10-CM | POA: Diagnosis not present

## 2020-02-03 DIAGNOSIS — N2581 Secondary hyperparathyroidism of renal origin: Secondary | ICD-10-CM | POA: Diagnosis not present

## 2020-02-03 DIAGNOSIS — Z992 Dependence on renal dialysis: Secondary | ICD-10-CM | POA: Diagnosis not present

## 2020-02-05 DIAGNOSIS — N2581 Secondary hyperparathyroidism of renal origin: Secondary | ICD-10-CM | POA: Diagnosis not present

## 2020-02-05 DIAGNOSIS — Z992 Dependence on renal dialysis: Secondary | ICD-10-CM | POA: Diagnosis not present

## 2020-02-05 DIAGNOSIS — N186 End stage renal disease: Secondary | ICD-10-CM | POA: Diagnosis not present

## 2020-02-07 DIAGNOSIS — N2581 Secondary hyperparathyroidism of renal origin: Secondary | ICD-10-CM | POA: Diagnosis not present

## 2020-02-07 DIAGNOSIS — Z992 Dependence on renal dialysis: Secondary | ICD-10-CM | POA: Diagnosis not present

## 2020-02-07 DIAGNOSIS — N186 End stage renal disease: Secondary | ICD-10-CM | POA: Diagnosis not present

## 2020-02-10 DIAGNOSIS — Z992 Dependence on renal dialysis: Secondary | ICD-10-CM | POA: Diagnosis not present

## 2020-02-10 DIAGNOSIS — N186 End stage renal disease: Secondary | ICD-10-CM | POA: Diagnosis not present

## 2020-02-10 DIAGNOSIS — N2581 Secondary hyperparathyroidism of renal origin: Secondary | ICD-10-CM | POA: Diagnosis not present

## 2020-02-12 DIAGNOSIS — N2581 Secondary hyperparathyroidism of renal origin: Secondary | ICD-10-CM | POA: Diagnosis not present

## 2020-02-12 DIAGNOSIS — N186 End stage renal disease: Secondary | ICD-10-CM | POA: Diagnosis not present

## 2020-02-12 DIAGNOSIS — Z992 Dependence on renal dialysis: Secondary | ICD-10-CM | POA: Diagnosis not present

## 2020-02-14 DIAGNOSIS — N2581 Secondary hyperparathyroidism of renal origin: Secondary | ICD-10-CM | POA: Diagnosis not present

## 2020-02-14 DIAGNOSIS — N186 End stage renal disease: Secondary | ICD-10-CM | POA: Diagnosis not present

## 2020-02-14 DIAGNOSIS — Z992 Dependence on renal dialysis: Secondary | ICD-10-CM | POA: Diagnosis not present

## 2020-02-17 DIAGNOSIS — N2581 Secondary hyperparathyroidism of renal origin: Secondary | ICD-10-CM | POA: Diagnosis not present

## 2020-02-17 DIAGNOSIS — N186 End stage renal disease: Secondary | ICD-10-CM | POA: Diagnosis not present

## 2020-02-17 DIAGNOSIS — Z992 Dependence on renal dialysis: Secondary | ICD-10-CM | POA: Diagnosis not present

## 2020-02-19 DIAGNOSIS — Z992 Dependence on renal dialysis: Secondary | ICD-10-CM | POA: Diagnosis not present

## 2020-02-19 DIAGNOSIS — N2581 Secondary hyperparathyroidism of renal origin: Secondary | ICD-10-CM | POA: Diagnosis not present

## 2020-02-19 DIAGNOSIS — N186 End stage renal disease: Secondary | ICD-10-CM | POA: Diagnosis not present

## 2020-02-21 DIAGNOSIS — N2581 Secondary hyperparathyroidism of renal origin: Secondary | ICD-10-CM | POA: Diagnosis not present

## 2020-02-21 DIAGNOSIS — Z992 Dependence on renal dialysis: Secondary | ICD-10-CM | POA: Diagnosis not present

## 2020-02-21 DIAGNOSIS — N186 End stage renal disease: Secondary | ICD-10-CM | POA: Diagnosis not present

## 2020-02-24 DIAGNOSIS — N186 End stage renal disease: Secondary | ICD-10-CM | POA: Diagnosis not present

## 2020-02-24 DIAGNOSIS — N2581 Secondary hyperparathyroidism of renal origin: Secondary | ICD-10-CM | POA: Diagnosis not present

## 2020-02-24 DIAGNOSIS — Z992 Dependence on renal dialysis: Secondary | ICD-10-CM | POA: Diagnosis not present

## 2020-02-26 DIAGNOSIS — Z992 Dependence on renal dialysis: Secondary | ICD-10-CM | POA: Diagnosis not present

## 2020-02-26 DIAGNOSIS — N2581 Secondary hyperparathyroidism of renal origin: Secondary | ICD-10-CM | POA: Diagnosis not present

## 2020-02-26 DIAGNOSIS — N186 End stage renal disease: Secondary | ICD-10-CM | POA: Diagnosis not present

## 2020-02-28 DIAGNOSIS — N2581 Secondary hyperparathyroidism of renal origin: Secondary | ICD-10-CM | POA: Diagnosis not present

## 2020-02-28 DIAGNOSIS — N186 End stage renal disease: Secondary | ICD-10-CM | POA: Diagnosis not present

## 2020-02-28 DIAGNOSIS — Z992 Dependence on renal dialysis: Secondary | ICD-10-CM | POA: Diagnosis not present

## 2020-03-01 DIAGNOSIS — Z992 Dependence on renal dialysis: Secondary | ICD-10-CM | POA: Diagnosis not present

## 2020-03-01 DIAGNOSIS — N186 End stage renal disease: Secondary | ICD-10-CM | POA: Diagnosis not present

## 2020-03-01 DIAGNOSIS — E1129 Type 2 diabetes mellitus with other diabetic kidney complication: Secondary | ICD-10-CM | POA: Diagnosis not present

## 2020-03-02 DIAGNOSIS — N186 End stage renal disease: Secondary | ICD-10-CM | POA: Diagnosis not present

## 2020-03-02 DIAGNOSIS — Z992 Dependence on renal dialysis: Secondary | ICD-10-CM | POA: Diagnosis not present

## 2020-03-02 DIAGNOSIS — N2581 Secondary hyperparathyroidism of renal origin: Secondary | ICD-10-CM | POA: Diagnosis not present

## 2020-03-04 DIAGNOSIS — N2581 Secondary hyperparathyroidism of renal origin: Secondary | ICD-10-CM | POA: Diagnosis not present

## 2020-03-04 DIAGNOSIS — Z992 Dependence on renal dialysis: Secondary | ICD-10-CM | POA: Diagnosis not present

## 2020-03-04 DIAGNOSIS — N186 End stage renal disease: Secondary | ICD-10-CM | POA: Diagnosis not present

## 2020-03-06 DIAGNOSIS — N186 End stage renal disease: Secondary | ICD-10-CM | POA: Diagnosis not present

## 2020-03-06 DIAGNOSIS — Z992 Dependence on renal dialysis: Secondary | ICD-10-CM | POA: Diagnosis not present

## 2020-03-06 DIAGNOSIS — N2581 Secondary hyperparathyroidism of renal origin: Secondary | ICD-10-CM | POA: Diagnosis not present

## 2020-03-09 DIAGNOSIS — Z992 Dependence on renal dialysis: Secondary | ICD-10-CM | POA: Diagnosis not present

## 2020-03-09 DIAGNOSIS — N186 End stage renal disease: Secondary | ICD-10-CM | POA: Diagnosis not present

## 2020-03-09 DIAGNOSIS — N2581 Secondary hyperparathyroidism of renal origin: Secondary | ICD-10-CM | POA: Diagnosis not present

## 2020-03-11 DIAGNOSIS — Z992 Dependence on renal dialysis: Secondary | ICD-10-CM | POA: Diagnosis not present

## 2020-03-11 DIAGNOSIS — N2581 Secondary hyperparathyroidism of renal origin: Secondary | ICD-10-CM | POA: Diagnosis not present

## 2020-03-11 DIAGNOSIS — N186 End stage renal disease: Secondary | ICD-10-CM | POA: Diagnosis not present

## 2020-03-16 DIAGNOSIS — Z992 Dependence on renal dialysis: Secondary | ICD-10-CM | POA: Diagnosis not present

## 2020-03-16 DIAGNOSIS — N186 End stage renal disease: Secondary | ICD-10-CM | POA: Diagnosis not present

## 2020-03-16 DIAGNOSIS — N2581 Secondary hyperparathyroidism of renal origin: Secondary | ICD-10-CM | POA: Diagnosis not present

## 2020-03-18 DIAGNOSIS — N186 End stage renal disease: Secondary | ICD-10-CM | POA: Diagnosis not present

## 2020-03-18 DIAGNOSIS — Z992 Dependence on renal dialysis: Secondary | ICD-10-CM | POA: Diagnosis not present

## 2020-03-18 DIAGNOSIS — N2581 Secondary hyperparathyroidism of renal origin: Secondary | ICD-10-CM | POA: Diagnosis not present

## 2020-03-20 DIAGNOSIS — Z992 Dependence on renal dialysis: Secondary | ICD-10-CM | POA: Diagnosis not present

## 2020-03-20 DIAGNOSIS — N2581 Secondary hyperparathyroidism of renal origin: Secondary | ICD-10-CM | POA: Diagnosis not present

## 2020-03-20 DIAGNOSIS — N186 End stage renal disease: Secondary | ICD-10-CM | POA: Diagnosis not present

## 2020-03-23 DIAGNOSIS — N186 End stage renal disease: Secondary | ICD-10-CM | POA: Diagnosis not present

## 2020-03-23 DIAGNOSIS — N2581 Secondary hyperparathyroidism of renal origin: Secondary | ICD-10-CM | POA: Diagnosis not present

## 2020-03-23 DIAGNOSIS — Z992 Dependence on renal dialysis: Secondary | ICD-10-CM | POA: Diagnosis not present

## 2020-03-25 DIAGNOSIS — N186 End stage renal disease: Secondary | ICD-10-CM | POA: Diagnosis not present

## 2020-03-25 DIAGNOSIS — N2581 Secondary hyperparathyroidism of renal origin: Secondary | ICD-10-CM | POA: Diagnosis not present

## 2020-03-25 DIAGNOSIS — Z992 Dependence on renal dialysis: Secondary | ICD-10-CM | POA: Diagnosis not present

## 2020-03-27 DIAGNOSIS — N186 End stage renal disease: Secondary | ICD-10-CM | POA: Diagnosis not present

## 2020-03-27 DIAGNOSIS — N2581 Secondary hyperparathyroidism of renal origin: Secondary | ICD-10-CM | POA: Diagnosis not present

## 2020-03-27 DIAGNOSIS — Z992 Dependence on renal dialysis: Secondary | ICD-10-CM | POA: Diagnosis not present

## 2020-03-30 DIAGNOSIS — N2581 Secondary hyperparathyroidism of renal origin: Secondary | ICD-10-CM | POA: Diagnosis not present

## 2020-03-30 DIAGNOSIS — N186 End stage renal disease: Secondary | ICD-10-CM | POA: Diagnosis not present

## 2020-03-30 DIAGNOSIS — Z992 Dependence on renal dialysis: Secondary | ICD-10-CM | POA: Diagnosis not present

## 2020-04-01 DIAGNOSIS — Z992 Dependence on renal dialysis: Secondary | ICD-10-CM | POA: Diagnosis not present

## 2020-04-01 DIAGNOSIS — N186 End stage renal disease: Secondary | ICD-10-CM | POA: Diagnosis not present

## 2020-04-01 DIAGNOSIS — N2581 Secondary hyperparathyroidism of renal origin: Secondary | ICD-10-CM | POA: Diagnosis not present

## 2020-04-01 DIAGNOSIS — E1129 Type 2 diabetes mellitus with other diabetic kidney complication: Secondary | ICD-10-CM | POA: Diagnosis not present

## 2020-04-03 DIAGNOSIS — Z992 Dependence on renal dialysis: Secondary | ICD-10-CM | POA: Diagnosis not present

## 2020-04-03 DIAGNOSIS — N186 End stage renal disease: Secondary | ICD-10-CM | POA: Diagnosis not present

## 2020-04-03 DIAGNOSIS — N2581 Secondary hyperparathyroidism of renal origin: Secondary | ICD-10-CM | POA: Diagnosis not present

## 2020-04-06 DIAGNOSIS — Z992 Dependence on renal dialysis: Secondary | ICD-10-CM | POA: Diagnosis not present

## 2020-04-06 DIAGNOSIS — N186 End stage renal disease: Secondary | ICD-10-CM | POA: Diagnosis not present

## 2020-04-06 DIAGNOSIS — N2581 Secondary hyperparathyroidism of renal origin: Secondary | ICD-10-CM | POA: Diagnosis not present

## 2020-04-08 DIAGNOSIS — N186 End stage renal disease: Secondary | ICD-10-CM | POA: Diagnosis not present

## 2020-04-08 DIAGNOSIS — N2581 Secondary hyperparathyroidism of renal origin: Secondary | ICD-10-CM | POA: Diagnosis not present

## 2020-04-08 DIAGNOSIS — Z992 Dependence on renal dialysis: Secondary | ICD-10-CM | POA: Diagnosis not present

## 2020-04-10 DIAGNOSIS — Z992 Dependence on renal dialysis: Secondary | ICD-10-CM | POA: Diagnosis not present

## 2020-04-10 DIAGNOSIS — N186 End stage renal disease: Secondary | ICD-10-CM | POA: Diagnosis not present

## 2020-04-10 DIAGNOSIS — N2581 Secondary hyperparathyroidism of renal origin: Secondary | ICD-10-CM | POA: Diagnosis not present

## 2020-04-13 DIAGNOSIS — Z992 Dependence on renal dialysis: Secondary | ICD-10-CM | POA: Diagnosis not present

## 2020-04-13 DIAGNOSIS — N2581 Secondary hyperparathyroidism of renal origin: Secondary | ICD-10-CM | POA: Diagnosis not present

## 2020-04-13 DIAGNOSIS — N186 End stage renal disease: Secondary | ICD-10-CM | POA: Diagnosis not present

## 2020-04-17 DIAGNOSIS — N2581 Secondary hyperparathyroidism of renal origin: Secondary | ICD-10-CM | POA: Diagnosis not present

## 2020-04-17 DIAGNOSIS — N186 End stage renal disease: Secondary | ICD-10-CM | POA: Diagnosis not present

## 2020-04-17 DIAGNOSIS — Z992 Dependence on renal dialysis: Secondary | ICD-10-CM | POA: Diagnosis not present

## 2020-04-20 DIAGNOSIS — N186 End stage renal disease: Secondary | ICD-10-CM | POA: Diagnosis not present

## 2020-04-20 DIAGNOSIS — N2581 Secondary hyperparathyroidism of renal origin: Secondary | ICD-10-CM | POA: Diagnosis not present

## 2020-04-20 DIAGNOSIS — Z992 Dependence on renal dialysis: Secondary | ICD-10-CM | POA: Diagnosis not present

## 2020-04-22 DIAGNOSIS — Z992 Dependence on renal dialysis: Secondary | ICD-10-CM | POA: Diagnosis not present

## 2020-04-22 DIAGNOSIS — N186 End stage renal disease: Secondary | ICD-10-CM | POA: Diagnosis not present

## 2020-04-22 DIAGNOSIS — N2581 Secondary hyperparathyroidism of renal origin: Secondary | ICD-10-CM | POA: Diagnosis not present

## 2020-04-24 DIAGNOSIS — Z992 Dependence on renal dialysis: Secondary | ICD-10-CM | POA: Diagnosis not present

## 2020-04-24 DIAGNOSIS — N186 End stage renal disease: Secondary | ICD-10-CM | POA: Diagnosis not present

## 2020-04-24 DIAGNOSIS — N2581 Secondary hyperparathyroidism of renal origin: Secondary | ICD-10-CM | POA: Diagnosis not present

## 2020-04-27 DIAGNOSIS — N2581 Secondary hyperparathyroidism of renal origin: Secondary | ICD-10-CM | POA: Diagnosis not present

## 2020-04-27 DIAGNOSIS — N186 End stage renal disease: Secondary | ICD-10-CM | POA: Diagnosis not present

## 2020-04-27 DIAGNOSIS — Z992 Dependence on renal dialysis: Secondary | ICD-10-CM | POA: Diagnosis not present

## 2020-04-28 DIAGNOSIS — E1122 Type 2 diabetes mellitus with diabetic chronic kidney disease: Secondary | ICD-10-CM | POA: Diagnosis not present

## 2020-04-28 DIAGNOSIS — I2584 Coronary atherosclerosis due to calcified coronary lesion: Secondary | ICD-10-CM | POA: Diagnosis not present

## 2020-04-28 DIAGNOSIS — N186 End stage renal disease: Secondary | ICD-10-CM | POA: Diagnosis not present

## 2020-04-28 DIAGNOSIS — R9439 Abnormal result of other cardiovascular function study: Secondary | ICD-10-CM | POA: Diagnosis not present

## 2020-04-28 DIAGNOSIS — Z992 Dependence on renal dialysis: Secondary | ICD-10-CM | POA: Diagnosis not present

## 2020-04-28 DIAGNOSIS — I2510 Atherosclerotic heart disease of native coronary artery without angina pectoris: Secondary | ICD-10-CM | POA: Diagnosis not present

## 2020-04-28 DIAGNOSIS — Z794 Long term (current) use of insulin: Secondary | ICD-10-CM | POA: Diagnosis not present

## 2020-04-28 DIAGNOSIS — I509 Heart failure, unspecified: Secondary | ICD-10-CM | POA: Diagnosis not present

## 2020-04-28 DIAGNOSIS — I132 Hypertensive heart and chronic kidney disease with heart failure and with stage 5 chronic kidney disease, or end stage renal disease: Secondary | ICD-10-CM | POA: Diagnosis not present

## 2020-05-01 DIAGNOSIS — Z992 Dependence on renal dialysis: Secondary | ICD-10-CM | POA: Diagnosis not present

## 2020-05-01 DIAGNOSIS — N186 End stage renal disease: Secondary | ICD-10-CM | POA: Diagnosis not present

## 2020-05-01 DIAGNOSIS — N2581 Secondary hyperparathyroidism of renal origin: Secondary | ICD-10-CM | POA: Diagnosis not present

## 2020-05-01 DIAGNOSIS — E1129 Type 2 diabetes mellitus with other diabetic kidney complication: Secondary | ICD-10-CM | POA: Diagnosis not present

## 2020-05-04 DIAGNOSIS — N2581 Secondary hyperparathyroidism of renal origin: Secondary | ICD-10-CM | POA: Diagnosis not present

## 2020-05-04 DIAGNOSIS — Z992 Dependence on renal dialysis: Secondary | ICD-10-CM | POA: Diagnosis not present

## 2020-05-04 DIAGNOSIS — N186 End stage renal disease: Secondary | ICD-10-CM | POA: Diagnosis not present

## 2020-05-06 DIAGNOSIS — Z992 Dependence on renal dialysis: Secondary | ICD-10-CM | POA: Diagnosis not present

## 2020-05-06 DIAGNOSIS — N186 End stage renal disease: Secondary | ICD-10-CM | POA: Diagnosis not present

## 2020-05-06 DIAGNOSIS — N2581 Secondary hyperparathyroidism of renal origin: Secondary | ICD-10-CM | POA: Diagnosis not present

## 2020-05-08 DIAGNOSIS — N2581 Secondary hyperparathyroidism of renal origin: Secondary | ICD-10-CM | POA: Diagnosis not present

## 2020-05-08 DIAGNOSIS — N186 End stage renal disease: Secondary | ICD-10-CM | POA: Diagnosis not present

## 2020-05-08 DIAGNOSIS — Z992 Dependence on renal dialysis: Secondary | ICD-10-CM | POA: Diagnosis not present

## 2020-05-11 DIAGNOSIS — N2581 Secondary hyperparathyroidism of renal origin: Secondary | ICD-10-CM | POA: Diagnosis not present

## 2020-05-11 DIAGNOSIS — Z992 Dependence on renal dialysis: Secondary | ICD-10-CM | POA: Diagnosis not present

## 2020-05-11 DIAGNOSIS — N186 End stage renal disease: Secondary | ICD-10-CM | POA: Diagnosis not present

## 2020-05-12 DIAGNOSIS — I871 Compression of vein: Secondary | ICD-10-CM | POA: Diagnosis not present

## 2020-05-12 DIAGNOSIS — N186 End stage renal disease: Secondary | ICD-10-CM | POA: Diagnosis not present

## 2020-05-12 DIAGNOSIS — Z992 Dependence on renal dialysis: Secondary | ICD-10-CM | POA: Diagnosis not present

## 2020-05-12 DIAGNOSIS — T82858A Stenosis of vascular prosthetic devices, implants and grafts, initial encounter: Secondary | ICD-10-CM | POA: Diagnosis not present

## 2020-05-13 DIAGNOSIS — N186 End stage renal disease: Secondary | ICD-10-CM | POA: Diagnosis not present

## 2020-05-13 DIAGNOSIS — N2581 Secondary hyperparathyroidism of renal origin: Secondary | ICD-10-CM | POA: Diagnosis not present

## 2020-05-13 DIAGNOSIS — Z992 Dependence on renal dialysis: Secondary | ICD-10-CM | POA: Diagnosis not present

## 2020-05-15 DIAGNOSIS — Z992 Dependence on renal dialysis: Secondary | ICD-10-CM | POA: Diagnosis not present

## 2020-05-15 DIAGNOSIS — N2581 Secondary hyperparathyroidism of renal origin: Secondary | ICD-10-CM | POA: Diagnosis not present

## 2020-05-15 DIAGNOSIS — N186 End stage renal disease: Secondary | ICD-10-CM | POA: Diagnosis not present

## 2020-05-18 DIAGNOSIS — N2581 Secondary hyperparathyroidism of renal origin: Secondary | ICD-10-CM | POA: Diagnosis not present

## 2020-05-18 DIAGNOSIS — N186 End stage renal disease: Secondary | ICD-10-CM | POA: Diagnosis not present

## 2020-05-18 DIAGNOSIS — Z992 Dependence on renal dialysis: Secondary | ICD-10-CM | POA: Diagnosis not present

## 2020-05-19 DIAGNOSIS — E113293 Type 2 diabetes mellitus with mild nonproliferative diabetic retinopathy without macular edema, bilateral: Secondary | ICD-10-CM | POA: Diagnosis not present

## 2020-05-19 DIAGNOSIS — H25013 Cortical age-related cataract, bilateral: Secondary | ICD-10-CM | POA: Diagnosis not present

## 2020-05-19 DIAGNOSIS — H524 Presbyopia: Secondary | ICD-10-CM | POA: Diagnosis not present

## 2020-05-19 DIAGNOSIS — H2513 Age-related nuclear cataract, bilateral: Secondary | ICD-10-CM | POA: Diagnosis not present

## 2020-05-20 DIAGNOSIS — N2581 Secondary hyperparathyroidism of renal origin: Secondary | ICD-10-CM | POA: Diagnosis not present

## 2020-05-20 DIAGNOSIS — N186 End stage renal disease: Secondary | ICD-10-CM | POA: Diagnosis not present

## 2020-05-20 DIAGNOSIS — Z992 Dependence on renal dialysis: Secondary | ICD-10-CM | POA: Diagnosis not present

## 2020-05-22 DIAGNOSIS — N186 End stage renal disease: Secondary | ICD-10-CM | POA: Diagnosis not present

## 2020-05-22 DIAGNOSIS — Z992 Dependence on renal dialysis: Secondary | ICD-10-CM | POA: Diagnosis not present

## 2020-05-22 DIAGNOSIS — N2581 Secondary hyperparathyroidism of renal origin: Secondary | ICD-10-CM | POA: Diagnosis not present

## 2020-05-25 DIAGNOSIS — Z992 Dependence on renal dialysis: Secondary | ICD-10-CM | POA: Diagnosis not present

## 2020-05-25 DIAGNOSIS — N2581 Secondary hyperparathyroidism of renal origin: Secondary | ICD-10-CM | POA: Diagnosis not present

## 2020-05-25 DIAGNOSIS — N186 End stage renal disease: Secondary | ICD-10-CM | POA: Diagnosis not present

## 2020-05-27 DIAGNOSIS — N2581 Secondary hyperparathyroidism of renal origin: Secondary | ICD-10-CM | POA: Diagnosis not present

## 2020-05-27 DIAGNOSIS — N186 End stage renal disease: Secondary | ICD-10-CM | POA: Diagnosis not present

## 2020-05-27 DIAGNOSIS — Z992 Dependence on renal dialysis: Secondary | ICD-10-CM | POA: Diagnosis not present

## 2020-05-29 DIAGNOSIS — N2581 Secondary hyperparathyroidism of renal origin: Secondary | ICD-10-CM | POA: Diagnosis not present

## 2020-05-29 DIAGNOSIS — N186 End stage renal disease: Secondary | ICD-10-CM | POA: Diagnosis not present

## 2020-05-29 DIAGNOSIS — Z992 Dependence on renal dialysis: Secondary | ICD-10-CM | POA: Diagnosis not present

## 2020-06-01 DIAGNOSIS — Z992 Dependence on renal dialysis: Secondary | ICD-10-CM | POA: Diagnosis not present

## 2020-06-01 DIAGNOSIS — N186 End stage renal disease: Secondary | ICD-10-CM | POA: Diagnosis not present

## 2020-06-01 DIAGNOSIS — N2581 Secondary hyperparathyroidism of renal origin: Secondary | ICD-10-CM | POA: Diagnosis not present

## 2020-06-01 DIAGNOSIS — E1129 Type 2 diabetes mellitus with other diabetic kidney complication: Secondary | ICD-10-CM | POA: Diagnosis not present

## 2020-06-03 DIAGNOSIS — N2581 Secondary hyperparathyroidism of renal origin: Secondary | ICD-10-CM | POA: Diagnosis not present

## 2020-06-03 DIAGNOSIS — N186 End stage renal disease: Secondary | ICD-10-CM | POA: Diagnosis not present

## 2020-06-03 DIAGNOSIS — Z992 Dependence on renal dialysis: Secondary | ICD-10-CM | POA: Diagnosis not present

## 2020-06-05 DIAGNOSIS — N186 End stage renal disease: Secondary | ICD-10-CM | POA: Diagnosis not present

## 2020-06-05 DIAGNOSIS — N2581 Secondary hyperparathyroidism of renal origin: Secondary | ICD-10-CM | POA: Diagnosis not present

## 2020-06-05 DIAGNOSIS — Z992 Dependence on renal dialysis: Secondary | ICD-10-CM | POA: Diagnosis not present

## 2020-06-08 DIAGNOSIS — Z992 Dependence on renal dialysis: Secondary | ICD-10-CM | POA: Diagnosis not present

## 2020-06-08 DIAGNOSIS — N186 End stage renal disease: Secondary | ICD-10-CM | POA: Diagnosis not present

## 2020-06-08 DIAGNOSIS — N2581 Secondary hyperparathyroidism of renal origin: Secondary | ICD-10-CM | POA: Diagnosis not present

## 2020-06-10 DIAGNOSIS — N186 End stage renal disease: Secondary | ICD-10-CM | POA: Diagnosis not present

## 2020-06-10 DIAGNOSIS — N2581 Secondary hyperparathyroidism of renal origin: Secondary | ICD-10-CM | POA: Diagnosis not present

## 2020-06-10 DIAGNOSIS — Z992 Dependence on renal dialysis: Secondary | ICD-10-CM | POA: Diagnosis not present

## 2020-06-12 DIAGNOSIS — N186 End stage renal disease: Secondary | ICD-10-CM | POA: Diagnosis not present

## 2020-06-12 DIAGNOSIS — N2581 Secondary hyperparathyroidism of renal origin: Secondary | ICD-10-CM | POA: Diagnosis not present

## 2020-06-12 DIAGNOSIS — Z992 Dependence on renal dialysis: Secondary | ICD-10-CM | POA: Diagnosis not present

## 2020-06-15 DIAGNOSIS — N186 End stage renal disease: Secondary | ICD-10-CM | POA: Diagnosis not present

## 2020-06-15 DIAGNOSIS — N2581 Secondary hyperparathyroidism of renal origin: Secondary | ICD-10-CM | POA: Diagnosis not present

## 2020-06-15 DIAGNOSIS — Z992 Dependence on renal dialysis: Secondary | ICD-10-CM | POA: Diagnosis not present

## 2020-06-17 DIAGNOSIS — N186 End stage renal disease: Secondary | ICD-10-CM | POA: Diagnosis not present

## 2020-06-17 DIAGNOSIS — N2581 Secondary hyperparathyroidism of renal origin: Secondary | ICD-10-CM | POA: Diagnosis not present

## 2020-06-17 DIAGNOSIS — Z992 Dependence on renal dialysis: Secondary | ICD-10-CM | POA: Diagnosis not present

## 2020-06-22 DIAGNOSIS — N186 End stage renal disease: Secondary | ICD-10-CM | POA: Diagnosis not present

## 2020-06-22 DIAGNOSIS — N2581 Secondary hyperparathyroidism of renal origin: Secondary | ICD-10-CM | POA: Diagnosis not present

## 2020-06-22 DIAGNOSIS — Z992 Dependence on renal dialysis: Secondary | ICD-10-CM | POA: Diagnosis not present

## 2020-06-24 DIAGNOSIS — N186 End stage renal disease: Secondary | ICD-10-CM | POA: Diagnosis not present

## 2020-06-24 DIAGNOSIS — N2581 Secondary hyperparathyroidism of renal origin: Secondary | ICD-10-CM | POA: Diagnosis not present

## 2020-06-24 DIAGNOSIS — Z992 Dependence on renal dialysis: Secondary | ICD-10-CM | POA: Diagnosis not present

## 2020-06-26 DIAGNOSIS — N2581 Secondary hyperparathyroidism of renal origin: Secondary | ICD-10-CM | POA: Diagnosis not present

## 2020-06-26 DIAGNOSIS — N186 End stage renal disease: Secondary | ICD-10-CM | POA: Diagnosis not present

## 2020-06-26 DIAGNOSIS — Z992 Dependence on renal dialysis: Secondary | ICD-10-CM | POA: Diagnosis not present

## 2020-06-29 DIAGNOSIS — N186 End stage renal disease: Secondary | ICD-10-CM | POA: Diagnosis not present

## 2020-06-29 DIAGNOSIS — N2581 Secondary hyperparathyroidism of renal origin: Secondary | ICD-10-CM | POA: Diagnosis not present

## 2020-06-29 DIAGNOSIS — Z992 Dependence on renal dialysis: Secondary | ICD-10-CM | POA: Diagnosis not present

## 2020-07-01 DIAGNOSIS — Z992 Dependence on renal dialysis: Secondary | ICD-10-CM | POA: Diagnosis not present

## 2020-07-01 DIAGNOSIS — N2581 Secondary hyperparathyroidism of renal origin: Secondary | ICD-10-CM | POA: Diagnosis not present

## 2020-07-01 DIAGNOSIS — N186 End stage renal disease: Secondary | ICD-10-CM | POA: Diagnosis not present

## 2020-07-01 DIAGNOSIS — E1129 Type 2 diabetes mellitus with other diabetic kidney complication: Secondary | ICD-10-CM | POA: Diagnosis not present

## 2020-07-02 HISTORY — PX: OTHER SURGICAL HISTORY: SHX169

## 2020-07-03 DIAGNOSIS — Z992 Dependence on renal dialysis: Secondary | ICD-10-CM | POA: Diagnosis not present

## 2020-07-03 DIAGNOSIS — N2581 Secondary hyperparathyroidism of renal origin: Secondary | ICD-10-CM | POA: Diagnosis not present

## 2020-07-03 DIAGNOSIS — N186 End stage renal disease: Secondary | ICD-10-CM | POA: Diagnosis not present

## 2020-07-06 DIAGNOSIS — N2581 Secondary hyperparathyroidism of renal origin: Secondary | ICD-10-CM | POA: Diagnosis not present

## 2020-07-06 DIAGNOSIS — N186 End stage renal disease: Secondary | ICD-10-CM | POA: Diagnosis not present

## 2020-07-06 DIAGNOSIS — Z992 Dependence on renal dialysis: Secondary | ICD-10-CM | POA: Diagnosis not present

## 2020-07-08 DIAGNOSIS — N2581 Secondary hyperparathyroidism of renal origin: Secondary | ICD-10-CM | POA: Diagnosis not present

## 2020-07-08 DIAGNOSIS — N186 End stage renal disease: Secondary | ICD-10-CM | POA: Diagnosis not present

## 2020-07-08 DIAGNOSIS — Z992 Dependence on renal dialysis: Secondary | ICD-10-CM | POA: Diagnosis not present

## 2020-07-12 DIAGNOSIS — E1122 Type 2 diabetes mellitus with diabetic chronic kidney disease: Secondary | ICD-10-CM | POA: Diagnosis not present

## 2020-07-12 DIAGNOSIS — S065X9A Traumatic subdural hemorrhage with loss of consciousness of unspecified duration, initial encounter: Secondary | ICD-10-CM | POA: Diagnosis not present

## 2020-07-12 DIAGNOSIS — Z043 Encounter for examination and observation following other accident: Secondary | ICD-10-CM | POA: Diagnosis not present

## 2020-07-12 DIAGNOSIS — S065X0A Traumatic subdural hemorrhage without loss of consciousness, initial encounter: Secondary | ICD-10-CM | POA: Diagnosis not present

## 2020-07-12 DIAGNOSIS — I132 Hypertensive heart and chronic kidney disease with heart failure and with stage 5 chronic kidney disease, or end stage renal disease: Secondary | ICD-10-CM | POA: Diagnosis not present

## 2020-07-12 DIAGNOSIS — J810 Acute pulmonary edema: Secondary | ICD-10-CM | POA: Diagnosis not present

## 2020-07-12 DIAGNOSIS — M79642 Pain in left hand: Secondary | ICD-10-CM | POA: Diagnosis not present

## 2020-07-12 DIAGNOSIS — Z992 Dependence on renal dialysis: Secondary | ICD-10-CM | POA: Diagnosis not present

## 2020-07-12 DIAGNOSIS — R29898 Other symptoms and signs involving the musculoskeletal system: Secondary | ICD-10-CM | POA: Diagnosis not present

## 2020-07-12 DIAGNOSIS — N185 Chronic kidney disease, stage 5: Secondary | ICD-10-CM | POA: Diagnosis not present

## 2020-07-12 DIAGNOSIS — Z20822 Contact with and (suspected) exposure to covid-19: Secondary | ICD-10-CM | POA: Diagnosis not present

## 2020-07-12 DIAGNOSIS — S7002XA Contusion of left hip, initial encounter: Secondary | ICD-10-CM | POA: Diagnosis not present

## 2020-07-12 DIAGNOSIS — Z794 Long term (current) use of insulin: Secondary | ICD-10-CM | POA: Diagnosis not present

## 2020-07-12 DIAGNOSIS — I509 Heart failure, unspecified: Secondary | ICD-10-CM | POA: Diagnosis not present

## 2020-07-12 DIAGNOSIS — M6281 Muscle weakness (generalized): Secondary | ICD-10-CM | POA: Diagnosis not present

## 2020-07-12 DIAGNOSIS — M25722 Osteophyte, left elbow: Secondary | ICD-10-CM | POA: Diagnosis not present

## 2020-07-12 DIAGNOSIS — R778 Other specified abnormalities of plasma proteins: Secondary | ICD-10-CM | POA: Diagnosis not present

## 2020-07-12 DIAGNOSIS — M25552 Pain in left hip: Secondary | ICD-10-CM | POA: Diagnosis not present

## 2020-07-13 DIAGNOSIS — G935 Compression of brain: Secondary | ICD-10-CM | POA: Diagnosis not present

## 2020-07-13 DIAGNOSIS — I6203 Nontraumatic chronic subdural hemorrhage: Secondary | ICD-10-CM | POA: Diagnosis not present

## 2020-07-13 DIAGNOSIS — S060X0A Concussion without loss of consciousness, initial encounter: Secondary | ICD-10-CM | POA: Diagnosis not present

## 2020-07-13 DIAGNOSIS — S065X9A Traumatic subdural hemorrhage with loss of consciousness of unspecified duration, initial encounter: Secondary | ICD-10-CM | POA: Diagnosis not present

## 2020-07-13 DIAGNOSIS — G8194 Hemiplegia, unspecified affecting left nondominant side: Secondary | ICD-10-CM | POA: Diagnosis not present

## 2020-07-13 DIAGNOSIS — D6832 Hemorrhagic disorder due to extrinsic circulating anticoagulants: Secondary | ICD-10-CM | POA: Diagnosis not present

## 2020-07-13 DIAGNOSIS — S7012XA Contusion of left thigh, initial encounter: Secondary | ICD-10-CM | POA: Diagnosis not present

## 2020-07-13 DIAGNOSIS — S065XAA Traumatic subdural hemorrhage with loss of consciousness status unknown, initial encounter: Secondary | ICD-10-CM | POA: Insufficient documentation

## 2020-07-13 DIAGNOSIS — R600 Localized edema: Secondary | ICD-10-CM | POA: Diagnosis not present

## 2020-07-13 DIAGNOSIS — I6200 Nontraumatic subdural hemorrhage, unspecified: Secondary | ICD-10-CM | POA: Diagnosis not present

## 2020-07-13 DIAGNOSIS — I440 Atrioventricular block, first degree: Secondary | ICD-10-CM | POA: Diagnosis not present

## 2020-07-13 DIAGNOSIS — I132 Hypertensive heart and chronic kidney disease with heart failure and with stage 5 chronic kidney disease, or end stage renal disease: Secondary | ICD-10-CM | POA: Diagnosis not present

## 2020-07-13 DIAGNOSIS — D631 Anemia in chronic kidney disease: Secondary | ICD-10-CM | POA: Diagnosis not present

## 2020-07-13 DIAGNOSIS — S06A0XA Traumatic brain compression without herniation, initial encounter: Secondary | ICD-10-CM | POA: Diagnosis not present

## 2020-07-13 DIAGNOSIS — N186 End stage renal disease: Secondary | ICD-10-CM | POA: Diagnosis not present

## 2020-07-13 DIAGNOSIS — E1122 Type 2 diabetes mellitus with diabetic chronic kidney disease: Secondary | ICD-10-CM | POA: Diagnosis not present

## 2020-07-13 DIAGNOSIS — Z794 Long term (current) use of insulin: Secondary | ICD-10-CM | POA: Diagnosis not present

## 2020-07-13 DIAGNOSIS — S065X0A Traumatic subdural hemorrhage without loss of consciousness, initial encounter: Secondary | ICD-10-CM | POA: Diagnosis not present

## 2020-07-13 DIAGNOSIS — S06309A Unspecified focal traumatic brain injury with loss of consciousness of unspecified duration, initial encounter: Secondary | ICD-10-CM | POA: Diagnosis not present

## 2020-07-13 DIAGNOSIS — I120 Hypertensive chronic kidney disease with stage 5 chronic kidney disease or end stage renal disease: Secondary | ICD-10-CM | POA: Diagnosis not present

## 2020-07-13 DIAGNOSIS — Z992 Dependence on renal dialysis: Secondary | ICD-10-CM | POA: Diagnosis not present

## 2020-07-13 DIAGNOSIS — N2581 Secondary hyperparathyroidism of renal origin: Secondary | ICD-10-CM | POA: Diagnosis not present

## 2020-07-13 DIAGNOSIS — I6202 Nontraumatic subacute subdural hemorrhage: Secondary | ICD-10-CM | POA: Diagnosis not present

## 2020-07-18 DIAGNOSIS — S060X0A Concussion without loss of consciousness, initial encounter: Secondary | ICD-10-CM | POA: Diagnosis not present

## 2020-07-20 DIAGNOSIS — N2581 Secondary hyperparathyroidism of renal origin: Secondary | ICD-10-CM | POA: Diagnosis not present

## 2020-07-20 DIAGNOSIS — N186 End stage renal disease: Secondary | ICD-10-CM | POA: Diagnosis not present

## 2020-07-20 DIAGNOSIS — Z992 Dependence on renal dialysis: Secondary | ICD-10-CM | POA: Diagnosis not present

## 2020-07-21 DIAGNOSIS — Z9181 History of falling: Secondary | ICD-10-CM | POA: Diagnosis not present

## 2020-07-21 DIAGNOSIS — Z992 Dependence on renal dialysis: Secondary | ICD-10-CM | POA: Diagnosis not present

## 2020-07-21 DIAGNOSIS — Z794 Long term (current) use of insulin: Secondary | ICD-10-CM | POA: Diagnosis not present

## 2020-07-21 DIAGNOSIS — E7800 Pure hypercholesterolemia, unspecified: Secondary | ICD-10-CM | POA: Diagnosis not present

## 2020-07-21 DIAGNOSIS — I120 Hypertensive chronic kidney disease with stage 5 chronic kidney disease or end stage renal disease: Secondary | ICD-10-CM | POA: Diagnosis not present

## 2020-07-21 DIAGNOSIS — S065X0D Traumatic subdural hemorrhage without loss of consciousness, subsequent encounter: Secondary | ICD-10-CM | POA: Diagnosis not present

## 2020-07-21 DIAGNOSIS — N186 End stage renal disease: Secondary | ICD-10-CM | POA: Diagnosis not present

## 2020-07-21 DIAGNOSIS — G8194 Hemiplegia, unspecified affecting left nondominant side: Secondary | ICD-10-CM | POA: Diagnosis not present

## 2020-07-21 DIAGNOSIS — E1122 Type 2 diabetes mellitus with diabetic chronic kidney disease: Secondary | ICD-10-CM | POA: Diagnosis not present

## 2020-07-22 DIAGNOSIS — N2581 Secondary hyperparathyroidism of renal origin: Secondary | ICD-10-CM | POA: Diagnosis not present

## 2020-07-22 DIAGNOSIS — N186 End stage renal disease: Secondary | ICD-10-CM | POA: Diagnosis not present

## 2020-07-22 DIAGNOSIS — Z992 Dependence on renal dialysis: Secondary | ICD-10-CM | POA: Diagnosis not present

## 2020-07-23 DIAGNOSIS — N186 End stage renal disease: Secondary | ICD-10-CM | POA: Diagnosis not present

## 2020-07-23 DIAGNOSIS — E7800 Pure hypercholesterolemia, unspecified: Secondary | ICD-10-CM | POA: Diagnosis not present

## 2020-07-23 DIAGNOSIS — Z992 Dependence on renal dialysis: Secondary | ICD-10-CM | POA: Diagnosis not present

## 2020-07-23 DIAGNOSIS — G8194 Hemiplegia, unspecified affecting left nondominant side: Secondary | ICD-10-CM | POA: Diagnosis not present

## 2020-07-23 DIAGNOSIS — S065X0D Traumatic subdural hemorrhage without loss of consciousness, subsequent encounter: Secondary | ICD-10-CM | POA: Diagnosis not present

## 2020-07-23 DIAGNOSIS — I120 Hypertensive chronic kidney disease with stage 5 chronic kidney disease or end stage renal disease: Secondary | ICD-10-CM | POA: Diagnosis not present

## 2020-07-23 DIAGNOSIS — Z794 Long term (current) use of insulin: Secondary | ICD-10-CM | POA: Diagnosis not present

## 2020-07-23 DIAGNOSIS — Z9181 History of falling: Secondary | ICD-10-CM | POA: Diagnosis not present

## 2020-07-23 DIAGNOSIS — E1122 Type 2 diabetes mellitus with diabetic chronic kidney disease: Secondary | ICD-10-CM | POA: Diagnosis not present

## 2020-07-24 DIAGNOSIS — N2581 Secondary hyperparathyroidism of renal origin: Secondary | ICD-10-CM | POA: Diagnosis not present

## 2020-07-24 DIAGNOSIS — Z992 Dependence on renal dialysis: Secondary | ICD-10-CM | POA: Diagnosis not present

## 2020-07-24 DIAGNOSIS — N186 End stage renal disease: Secondary | ICD-10-CM | POA: Diagnosis not present

## 2020-07-27 DIAGNOSIS — N186 End stage renal disease: Secondary | ICD-10-CM | POA: Diagnosis not present

## 2020-07-27 DIAGNOSIS — S065X9A Traumatic subdural hemorrhage with loss of consciousness of unspecified duration, initial encounter: Secondary | ICD-10-CM | POA: Diagnosis not present

## 2020-07-27 DIAGNOSIS — Z09 Encounter for follow-up examination after completed treatment for conditions other than malignant neoplasm: Secondary | ICD-10-CM | POA: Diagnosis not present

## 2020-07-27 DIAGNOSIS — Z4889 Encounter for other specified surgical aftercare: Secondary | ICD-10-CM | POA: Diagnosis not present

## 2020-07-27 DIAGNOSIS — Z992 Dependence on renal dialysis: Secondary | ICD-10-CM | POA: Diagnosis not present

## 2020-07-27 DIAGNOSIS — N2581 Secondary hyperparathyroidism of renal origin: Secondary | ICD-10-CM | POA: Diagnosis not present

## 2020-07-28 DIAGNOSIS — E1129 Type 2 diabetes mellitus with other diabetic kidney complication: Secondary | ICD-10-CM | POA: Diagnosis not present

## 2020-07-28 DIAGNOSIS — I120 Hypertensive chronic kidney disease with stage 5 chronic kidney disease or end stage renal disease: Secondary | ICD-10-CM | POA: Diagnosis not present

## 2020-07-28 DIAGNOSIS — S065X9A Traumatic subdural hemorrhage with loss of consciousness of unspecified duration, initial encounter: Secondary | ICD-10-CM | POA: Diagnosis not present

## 2020-07-28 DIAGNOSIS — I2510 Atherosclerotic heart disease of native coronary artery without angina pectoris: Secondary | ICD-10-CM | POA: Diagnosis not present

## 2020-07-28 DIAGNOSIS — Z9181 History of falling: Secondary | ICD-10-CM | POA: Diagnosis not present

## 2020-07-28 DIAGNOSIS — E1122 Type 2 diabetes mellitus with diabetic chronic kidney disease: Secondary | ICD-10-CM | POA: Diagnosis not present

## 2020-07-28 DIAGNOSIS — Z794 Long term (current) use of insulin: Secondary | ICD-10-CM | POA: Diagnosis not present

## 2020-07-28 DIAGNOSIS — Z992 Dependence on renal dialysis: Secondary | ICD-10-CM | POA: Diagnosis not present

## 2020-07-28 DIAGNOSIS — N186 End stage renal disease: Secondary | ICD-10-CM | POA: Diagnosis not present

## 2020-07-28 DIAGNOSIS — S065X0D Traumatic subdural hemorrhage without loss of consciousness, subsequent encounter: Secondary | ICD-10-CM | POA: Diagnosis not present

## 2020-07-28 DIAGNOSIS — G8194 Hemiplegia, unspecified affecting left nondominant side: Secondary | ICD-10-CM | POA: Diagnosis not present

## 2020-07-28 DIAGNOSIS — E7800 Pure hypercholesterolemia, unspecified: Secondary | ICD-10-CM | POA: Diagnosis not present

## 2020-07-29 DIAGNOSIS — N186 End stage renal disease: Secondary | ICD-10-CM | POA: Diagnosis not present

## 2020-07-29 DIAGNOSIS — Z992 Dependence on renal dialysis: Secondary | ICD-10-CM | POA: Diagnosis not present

## 2020-07-29 DIAGNOSIS — N2581 Secondary hyperparathyroidism of renal origin: Secondary | ICD-10-CM | POA: Diagnosis not present

## 2020-07-30 DIAGNOSIS — G8194 Hemiplegia, unspecified affecting left nondominant side: Secondary | ICD-10-CM | POA: Diagnosis not present

## 2020-07-30 DIAGNOSIS — I120 Hypertensive chronic kidney disease with stage 5 chronic kidney disease or end stage renal disease: Secondary | ICD-10-CM | POA: Diagnosis not present

## 2020-07-30 DIAGNOSIS — E7800 Pure hypercholesterolemia, unspecified: Secondary | ICD-10-CM | POA: Diagnosis not present

## 2020-07-30 DIAGNOSIS — Z992 Dependence on renal dialysis: Secondary | ICD-10-CM | POA: Diagnosis not present

## 2020-07-30 DIAGNOSIS — S065X0D Traumatic subdural hemorrhage without loss of consciousness, subsequent encounter: Secondary | ICD-10-CM | POA: Diagnosis not present

## 2020-07-30 DIAGNOSIS — Z9181 History of falling: Secondary | ICD-10-CM | POA: Diagnosis not present

## 2020-07-30 DIAGNOSIS — N186 End stage renal disease: Secondary | ICD-10-CM | POA: Diagnosis not present

## 2020-07-30 DIAGNOSIS — E1122 Type 2 diabetes mellitus with diabetic chronic kidney disease: Secondary | ICD-10-CM | POA: Diagnosis not present

## 2020-07-30 DIAGNOSIS — Z794 Long term (current) use of insulin: Secondary | ICD-10-CM | POA: Diagnosis not present

## 2020-07-31 DIAGNOSIS — N2581 Secondary hyperparathyroidism of renal origin: Secondary | ICD-10-CM | POA: Diagnosis not present

## 2020-07-31 DIAGNOSIS — Z992 Dependence on renal dialysis: Secondary | ICD-10-CM | POA: Diagnosis not present

## 2020-07-31 DIAGNOSIS — N186 End stage renal disease: Secondary | ICD-10-CM | POA: Diagnosis not present

## 2020-08-01 DIAGNOSIS — E1129 Type 2 diabetes mellitus with other diabetic kidney complication: Secondary | ICD-10-CM | POA: Diagnosis not present

## 2020-08-01 DIAGNOSIS — Z992 Dependence on renal dialysis: Secondary | ICD-10-CM | POA: Diagnosis not present

## 2020-08-01 DIAGNOSIS — N186 End stage renal disease: Secondary | ICD-10-CM | POA: Diagnosis not present

## 2020-08-03 DIAGNOSIS — Z992 Dependence on renal dialysis: Secondary | ICD-10-CM | POA: Diagnosis not present

## 2020-08-03 DIAGNOSIS — N186 End stage renal disease: Secondary | ICD-10-CM | POA: Diagnosis not present

## 2020-08-03 DIAGNOSIS — N2581 Secondary hyperparathyroidism of renal origin: Secondary | ICD-10-CM | POA: Diagnosis not present

## 2020-08-04 DIAGNOSIS — S065X0D Traumatic subdural hemorrhage without loss of consciousness, subsequent encounter: Secondary | ICD-10-CM | POA: Diagnosis not present

## 2020-08-04 DIAGNOSIS — E7800 Pure hypercholesterolemia, unspecified: Secondary | ICD-10-CM | POA: Diagnosis not present

## 2020-08-04 DIAGNOSIS — G8194 Hemiplegia, unspecified affecting left nondominant side: Secondary | ICD-10-CM | POA: Diagnosis not present

## 2020-08-04 DIAGNOSIS — N186 End stage renal disease: Secondary | ICD-10-CM | POA: Diagnosis not present

## 2020-08-04 DIAGNOSIS — I120 Hypertensive chronic kidney disease with stage 5 chronic kidney disease or end stage renal disease: Secondary | ICD-10-CM | POA: Diagnosis not present

## 2020-08-04 DIAGNOSIS — Z992 Dependence on renal dialysis: Secondary | ICD-10-CM | POA: Diagnosis not present

## 2020-08-04 DIAGNOSIS — Z9181 History of falling: Secondary | ICD-10-CM | POA: Diagnosis not present

## 2020-08-04 DIAGNOSIS — E1122 Type 2 diabetes mellitus with diabetic chronic kidney disease: Secondary | ICD-10-CM | POA: Diagnosis not present

## 2020-08-04 DIAGNOSIS — Z794 Long term (current) use of insulin: Secondary | ICD-10-CM | POA: Diagnosis not present

## 2020-08-05 DIAGNOSIS — Z992 Dependence on renal dialysis: Secondary | ICD-10-CM | POA: Diagnosis not present

## 2020-08-05 DIAGNOSIS — N186 End stage renal disease: Secondary | ICD-10-CM | POA: Diagnosis not present

## 2020-08-05 DIAGNOSIS — N2581 Secondary hyperparathyroidism of renal origin: Secondary | ICD-10-CM | POA: Diagnosis not present

## 2020-08-06 ENCOUNTER — Ambulatory Visit: Payer: Medicare HMO | Admitting: Family Medicine

## 2020-08-06 DIAGNOSIS — N186 End stage renal disease: Secondary | ICD-10-CM | POA: Diagnosis not present

## 2020-08-06 DIAGNOSIS — E1122 Type 2 diabetes mellitus with diabetic chronic kidney disease: Secondary | ICD-10-CM | POA: Diagnosis not present

## 2020-08-06 DIAGNOSIS — I120 Hypertensive chronic kidney disease with stage 5 chronic kidney disease or end stage renal disease: Secondary | ICD-10-CM | POA: Diagnosis not present

## 2020-08-06 DIAGNOSIS — G8194 Hemiplegia, unspecified affecting left nondominant side: Secondary | ICD-10-CM | POA: Diagnosis not present

## 2020-08-06 DIAGNOSIS — S065X0D Traumatic subdural hemorrhage without loss of consciousness, subsequent encounter: Secondary | ICD-10-CM | POA: Diagnosis not present

## 2020-08-06 DIAGNOSIS — Z794 Long term (current) use of insulin: Secondary | ICD-10-CM | POA: Diagnosis not present

## 2020-08-06 DIAGNOSIS — Z992 Dependence on renal dialysis: Secondary | ICD-10-CM | POA: Diagnosis not present

## 2020-08-06 DIAGNOSIS — Z9181 History of falling: Secondary | ICD-10-CM | POA: Diagnosis not present

## 2020-08-06 DIAGNOSIS — E7800 Pure hypercholesterolemia, unspecified: Secondary | ICD-10-CM | POA: Diagnosis not present

## 2020-08-07 DIAGNOSIS — N2581 Secondary hyperparathyroidism of renal origin: Secondary | ICD-10-CM | POA: Diagnosis not present

## 2020-08-07 DIAGNOSIS — N186 End stage renal disease: Secondary | ICD-10-CM | POA: Diagnosis not present

## 2020-08-07 DIAGNOSIS — Z992 Dependence on renal dialysis: Secondary | ICD-10-CM | POA: Diagnosis not present

## 2020-08-09 DIAGNOSIS — N186 End stage renal disease: Secondary | ICD-10-CM | POA: Diagnosis not present

## 2020-08-09 DIAGNOSIS — I120 Hypertensive chronic kidney disease with stage 5 chronic kidney disease or end stage renal disease: Secondary | ICD-10-CM | POA: Diagnosis not present

## 2020-08-09 DIAGNOSIS — G8194 Hemiplegia, unspecified affecting left nondominant side: Secondary | ICD-10-CM | POA: Diagnosis not present

## 2020-08-09 DIAGNOSIS — Z9181 History of falling: Secondary | ICD-10-CM | POA: Diagnosis not present

## 2020-08-09 DIAGNOSIS — E7800 Pure hypercholesterolemia, unspecified: Secondary | ICD-10-CM | POA: Diagnosis not present

## 2020-08-09 DIAGNOSIS — Z794 Long term (current) use of insulin: Secondary | ICD-10-CM | POA: Diagnosis not present

## 2020-08-09 DIAGNOSIS — Z992 Dependence on renal dialysis: Secondary | ICD-10-CM | POA: Diagnosis not present

## 2020-08-09 DIAGNOSIS — E1122 Type 2 diabetes mellitus with diabetic chronic kidney disease: Secondary | ICD-10-CM | POA: Diagnosis not present

## 2020-08-09 DIAGNOSIS — S065X0D Traumatic subdural hemorrhage without loss of consciousness, subsequent encounter: Secondary | ICD-10-CM | POA: Diagnosis not present

## 2020-08-10 DIAGNOSIS — N2581 Secondary hyperparathyroidism of renal origin: Secondary | ICD-10-CM | POA: Diagnosis not present

## 2020-08-10 DIAGNOSIS — N186 End stage renal disease: Secondary | ICD-10-CM | POA: Diagnosis not present

## 2020-08-10 DIAGNOSIS — Z992 Dependence on renal dialysis: Secondary | ICD-10-CM | POA: Diagnosis not present

## 2020-08-11 DIAGNOSIS — I871 Compression of vein: Secondary | ICD-10-CM | POA: Diagnosis not present

## 2020-08-11 DIAGNOSIS — Z992 Dependence on renal dialysis: Secondary | ICD-10-CM | POA: Diagnosis not present

## 2020-08-11 DIAGNOSIS — T82858A Stenosis of vascular prosthetic devices, implants and grafts, initial encounter: Secondary | ICD-10-CM | POA: Diagnosis not present

## 2020-08-11 DIAGNOSIS — N186 End stage renal disease: Secondary | ICD-10-CM | POA: Diagnosis not present

## 2020-08-12 DIAGNOSIS — N2581 Secondary hyperparathyroidism of renal origin: Secondary | ICD-10-CM | POA: Diagnosis not present

## 2020-08-12 DIAGNOSIS — N186 End stage renal disease: Secondary | ICD-10-CM | POA: Diagnosis not present

## 2020-08-12 DIAGNOSIS — Z992 Dependence on renal dialysis: Secondary | ICD-10-CM | POA: Diagnosis not present

## 2020-08-13 DIAGNOSIS — E1122 Type 2 diabetes mellitus with diabetic chronic kidney disease: Secondary | ICD-10-CM | POA: Diagnosis not present

## 2020-08-13 DIAGNOSIS — Z9181 History of falling: Secondary | ICD-10-CM | POA: Diagnosis not present

## 2020-08-13 DIAGNOSIS — Z794 Long term (current) use of insulin: Secondary | ICD-10-CM | POA: Diagnosis not present

## 2020-08-13 DIAGNOSIS — I120 Hypertensive chronic kidney disease with stage 5 chronic kidney disease or end stage renal disease: Secondary | ICD-10-CM | POA: Diagnosis not present

## 2020-08-13 DIAGNOSIS — S065X0D Traumatic subdural hemorrhage without loss of consciousness, subsequent encounter: Secondary | ICD-10-CM | POA: Diagnosis not present

## 2020-08-13 DIAGNOSIS — Z992 Dependence on renal dialysis: Secondary | ICD-10-CM | POA: Diagnosis not present

## 2020-08-13 DIAGNOSIS — G8194 Hemiplegia, unspecified affecting left nondominant side: Secondary | ICD-10-CM | POA: Diagnosis not present

## 2020-08-13 DIAGNOSIS — N186 End stage renal disease: Secondary | ICD-10-CM | POA: Diagnosis not present

## 2020-08-13 DIAGNOSIS — E7800 Pure hypercholesterolemia, unspecified: Secondary | ICD-10-CM | POA: Diagnosis not present

## 2020-08-14 DIAGNOSIS — N2581 Secondary hyperparathyroidism of renal origin: Secondary | ICD-10-CM | POA: Diagnosis not present

## 2020-08-14 DIAGNOSIS — N186 End stage renal disease: Secondary | ICD-10-CM | POA: Diagnosis not present

## 2020-08-14 DIAGNOSIS — Z992 Dependence on renal dialysis: Secondary | ICD-10-CM | POA: Diagnosis not present

## 2020-08-15 NOTE — Progress Notes (Addendum)
Office Visit    Patient Name: Justin [name] Date of Encounter: 09/04/2020  PCP:  Reynold Bowen, [place]  Cardiologist:  Kirk Ruths, MD  Advanced Practice Provider:  No care team member to display Electrophysiologist:  None    Chief Complaint    Justin [name] is a 70 y.o. male with a hx of hypertension, congestive heart failure, coronary artery disease, SDH, HLD presents today for hospital follow up.  Past Medical History    Past Medical History:  Diagnosis Date   BPH (benign prostatic hyperplasia)    Chronic kidney disease (CKD), stage IV (severe) (HCC)    Diabetes (HCC)    Dysuria    Erectile dysfunction    Hyperlipemia    Hypertension    Pinna disorder, left    Renal disorder    Wears glasses    Past Surgical History:  Procedure Laterality Date   AV FISTULA PLACEMENT Left 04/30/2018   Procedure: CREATION OF RADIOCEPHALIC ARTERIOVENOUS FISTULA LEFT ARM;  Surgeon: Elam Dutch, MD;  Location: [facility] OR;  Service: Vascular;  Laterality: Left;   AV FISTULA PLACEMENT Left 08/28/2018   Procedure: ARTERIOVENOUS (AV) BRACHIOCEPHALIC FISTULA CREATION LEFT UPPER ARM;  Surgeon: Marty Heck, MD;  Location: [facility] OR;  Service: Vascular;  Laterality: Left;   IR FLUORO GUIDE CV LINE RIGHT  04/29/2018   IR US GUIDE VASC ACCESS RIGHT  04/29/2018   RIGHT/LEFT HEART CATH AND CORONARY ANGIOGRAPHY N/A 05/01/2018   Procedure: RIGHT/LEFT HEART CATH AND CORONARY ANGIOGRAPHY;  Surgeon: Belva Crome, MD;  Location: [facility] CV LAB;  Service: Cardiovascular;  Laterality: N/A;   subdural hematoma Right 07/2020    Allergies  No Known Allergies  History of Present Illness    Justin [name] is a 70 y.o. male with a hx of hypertension, congestive heart failure, coronary artery disease, SDH, HLD last seen 11/12/19 by Dr. Stanford Breed.  Hospitalized April 2020 with hypertensive urgency and congestive heart failure. Echo 04/2018 with LVEF 35-40%,  moderate LV enlargement, moderate LA enlargement, small pericardial effusion, moderate aortic stenosis. LHC with 50-60% mid LAD, apical 90% LAD, 75% first diagonal, 75% Cx, occluded first marginal, 50% RCA, 95% PDA. RHC with mild aortic stenosis. He was recommended for medical therapy. He had basline insufficiency and initial creatinine 6.76, HD initiated.   He was seen 11/12/19 in follow up by Dr. Stanford Breed. Noted elevated BP. He was started on Hydralazine '25mg'$  TID. Repeat echocardiogram was ordered but not completed. He did have carotid duplex due to bruit with bilateral 1-39% ICA stenosis.    He was seen by cardiology at [facility] for evaluation of minimally positive nuclear stress test (mild inducible ischemia of apical inferolateral wall) as part of pre transplant evaluation. LHC 04/28/20 with LAD showing mild plaque (25% proximal, 20% distal), D1 20% proximal plaque, ramus 10% prox plaque, RCA 45% mid, 75% prox focal stenosis in long PDA, small right PL 60-70% plaque. Recommended for medical therapy  He was seen in the ED 07/12/20 with subacute on chronic R SDH after fall 1 week prior. He went to the OR 07/13/20 for R burr hold for SDH evaluation. He received 1u PRBC for Hb <7.   He presents today for follow up with his girlfriend of many years. Cases review complicated by records in multiple health systems. When asked about his fall prior to SDH he tells me he was out of the shower and took a misstep. He was  significantly bruised. Note son and off headaches. No precipitating lightheadedness in that instance but does not episodes of periodic lightheadedness. Discussed orthostatic hypotension, dehydration, hypoglycemia as potential causes. Does note he often skips meals and works a hard job with livestock. He does HD on Tues, Thursday, Saturday. Tells me every time he goes to dialysis his blood pressure is high but then improves after treatment. Does not check BP routinely at home. He and his significant  other were sent a letter that he was "off the transplant list" but he was not certain why. Encouraged to reach out to them to discuss.   EKGs/Labs/Other Studies Reviewed:   The following studies were reviewed today:  LHC 04/2020 FINDINGS/RECOMMENDATIONS:  1.  Moderate fluoroscopic coronary calcification.  2. LMT normal. LAD has only mild plaques including 25% proximal and 20% distal  irregularities.  D1 has a 20% proximal plaque. Ramus is large and has a 10% proximal plaque. Circumflex has a proximal eccentric 40 to 50% plaque. Dominant RCA has focal 45% narrowing mid, and 75% proximal focal stenosis  in the long PDA.  The small right PL also has a 60 to 70% proximal plaque.  Fortunately, patient's nuclear stress test 02/27/20 showed merely "Mild  inducible ischemia of the apical inferolateral wall".   Showed pt and family pics and updated referring cardiologist (Dr. Erline Levine  who saw him was not available.  Therefore discussed with Dr. Kathee Delton  who is cardiologist liaison with transplant nephrology).  Needs more optimal medical therapy for now - hemoglobin A1c 9.6% still!  (Fortunately most recent LDL is 23, but triglycerides >400). Coronary Findings Diagnostic Dominance: Right Left Anterior Descending: Mid LAD lesion is 25% stenosed. Dist LAD lesion is 20% stenosed. First Diagonal Branch: 1st Diag lesion is 20% stenosed.  Ramus Intermedius: Ramus lesion is 10% stenosed.  Left Circumflex: Prox Cx lesion is 50% stenosed. The lesion is calcified.  Right Coronary Artery: Mid RCA lesion is 45% stenosed. The lesion is calcified. Right Posterior Descending Artery: RPDA lesion is 75% stenosed. Culprit lesion. TIMI flow is 3. The lesion is not complex (non high-C). Second Right Posterolateral Branch: 2nd RPL lesion is 70% stenosed.   EKG:  EKG is ordered today.  The ekg ordered today demonstrates NSR 65 bpm with first degree AV block 262 ms. Known bifasicular block with RBBB, LAFB.    Recent Labs: 08/24/2020: Hemoglobin 10.1; Platelets 158.0  Recent Lipid Panel    Component Value Date/Time   CHOL 109 04/25/2018 0429   TRIG 73 04/25/2018 0429   HDL 34 (L) 04/25/2018 0429   CHOLHDL 3.2 04/25/2018 0429   VLDL 15 04/25/2018 0429   LDLCALC 60 04/25/2018 0429   LDLDIRECT 32 06/05/2019 1639   Home Medications   Current Meds  Medication Sig   hydrALAZINE (APRESOLINE) 25 MG tablet Take 1 tablet (25 mg total) by mouth 3 (three) times daily. (Patient taking differently: Take 25 mg by mouth 3 (three) times daily. Take 2 tablets 3xs a day tues, thur,sat)   isosorbide mononitrate (IMDUR) 30 MG 24 hr tablet Take one '30mg'$  in the tablet on the morning on non-dialysis days.   metoprolol succinate (TOPROL-XL) 50 MG 24 hr tablet Take 50 mg by mouth daily. Take 2 tablets 2xs a day mon,wed,fri,sun   NOVOLIN 70/30 RELION (70-30) 100 UNIT/ML injection Inject 20 Units into the skin daily. (Patient taking differently: Inject 20 Units into the skin See admin instructions. Inject 20 units subcutaneously with breakfast on Sunday, Monday, Wednesday, Friday; inject  20 units with lunch on Tuesday, Thursday, Saturday (dialysis days))   [EXPIRED] rosuvastatin (CRESTOR) 40 MG tablet Take 40 mg by mouth daily.   [EXPIRED] sevelamer carbonate (RENVELA) 800 MG tablet Take by mouth.     Review of Systems      All other systems reviewed and are otherwise negative except as noted above.  Physical Exam    VS:  BP (!) 184/68 (BP Location: Left Arm, Patient Position: Sitting, Cuff Size: Normal)   Pulse 65   Ht '5\' 9"'$  (1.753 m)   Wt 189 lb 3.2 oz (85.8 kg)   BMI 27.94 kg/m  , BMI Body mass index is 27.94 kg/m.  Wt Readings from Last 3 Encounters:  08/24/20 186 lb 8 oz (84.6 kg)  08/16/20 189 lb 3.2 oz (85.8 kg)  11/12/19 192 lb (87.1 kg)     GEN: Well nourished, well developed, in no acute distress. HEENT: normal. Neck: Supple, no JVD or masses. Cardiac: RRR, no  rubs, or gallops. Gr 2/6  systolic murmur. No clubbing, cyanosis, edema.  Radials/PT 2+ and equal bilaterally.  Respiratory:  Respirations regular and unlabored, clear to auscultation bilaterally. GI: Soft, nontender, nondistended. MS: No deformity or atrophy. Skin: Warm and dry, no rash. Neuro:  Strength and sensation are intact. Psych: Normal affect.  Assessment & Plan    CAD - Stable with no anginal symptoms. No indication for ischemic evaluation.  LHC 04/2020 detailed above. GDMT includes statin. Start Imdur for optimal BP control.   Aortic stenosis - 04/2018 moderate aortic stenosis by echo. Due for repeat echocardiogram, ordered today. No chest pain, worsening shortness of breath.   HFrEF / Hypretensive mediated cardiomyopathy - Echo 04/2018 EF 35-40%. Volume management per HD. GDMT limited by CKD. Continue Toprol, Hydralazine. Will add Imdur, as below for GDMT and antihypertensive benefit. Update echo to reassess LVEF.   SDH - Recent burr hole 07/13/20. No recurrent falls. Intermittent headache which was present prior to recent [facility] intervention. Educated to report to ED for recurrent fall.   Lightheadedness - Possible etiology orthostatic hypotension, dehydration, hypoglycemia. No near syncope, syncope. No pattern to episodes. No noted triggers. Encouraged regular meals and to monitor for symptoms associated with position changes. No evidence of hypotension in clinic or at HD. Denies palpitations, no indication for ZIO. Update echo to rule out worsening aortic stenosis.   HTN - Not controlled. Continue Toprol '50mg'$  QD, Hydralazine '25mg'$  TID. Will initiate Imdur '30mg'$  QAM on non dialysis days. BP goal <130/80. Will not take on dialysis days as reports his blood pressure becomes "normal" after dialysis. Anticipate will require further titration of Hydralazine and/or Imdur but will proceed with small dose changes given his reported lightheadedness.  HLD - 06/2019 direct LDL 32. Due for repeat lipids with next fasting blood  work. Continue Rosuvastatin '40mg'$  QD. Denies mylagias.  ESRD - Follows with nephrology for CKD and HD. Encouraged to call [facility] transplant team with his questions on why he was not a candidate. Previous notes state A1c of >9 but improved by most recent check 07/30/20 to A1c of 6.2   DM2 - 07/30/20 A1c 6.2. Congratulated on control. Continue to follow with PCP.   Carotid artery disease - 11/20/19 bilateral 1-39% stenosis. No amaurosis fugax. Periodic monitoring as clinically indicated. Recommend optimal lipid control.   Disposition: Follow up in 2 months with Dr. Bettina Gavia. As follow up care has been difficult with transportation to [place] and HD, will switch to [place] office per patient request.  Signed, Loel Dubonnet, NP 09/04/2020, 8:32 AM [place]

## 2020-08-16 ENCOUNTER — Other Ambulatory Visit: Payer: Self-pay

## 2020-08-16 ENCOUNTER — Encounter (HOSPITAL_BASED_OUTPATIENT_CLINIC_OR_DEPARTMENT_OTHER): Payer: Self-pay | Admitting: Family

## 2020-08-16 ENCOUNTER — Ambulatory Visit (INDEPENDENT_AMBULATORY_CARE_PROVIDER_SITE_OTHER): Payer: Medicare HMO | Admitting: Family

## 2020-08-16 VITALS — BP 184/68 | HR 65 | Ht 69.0 in | Wt 189.2 lb

## 2020-08-16 DIAGNOSIS — E785 Hyperlipidemia, unspecified: Secondary | ICD-10-CM | POA: Diagnosis not present

## 2020-08-16 DIAGNOSIS — I25118 Atherosclerotic heart disease of native coronary artery with other forms of angina pectoris: Secondary | ICD-10-CM | POA: Diagnosis not present

## 2020-08-16 DIAGNOSIS — Z79899 Other long term (current) drug therapy: Secondary | ICD-10-CM | POA: Diagnosis not present

## 2020-08-16 DIAGNOSIS — N186 End stage renal disease: Secondary | ICD-10-CM | POA: Diagnosis not present

## 2020-08-16 DIAGNOSIS — I6523 Occlusion and stenosis of bilateral carotid arteries: Secondary | ICD-10-CM | POA: Diagnosis not present

## 2020-08-16 DIAGNOSIS — I10 Essential (primary) hypertension: Secondary | ICD-10-CM

## 2020-08-16 DIAGNOSIS — I350 Nonrheumatic aortic (valve) stenosis: Secondary | ICD-10-CM

## 2020-08-16 DIAGNOSIS — Z992 Dependence on renal dialysis: Secondary | ICD-10-CM

## 2020-08-16 DIAGNOSIS — I5020 Unspecified systolic (congestive) heart failure: Secondary | ICD-10-CM | POA: Diagnosis not present

## 2020-08-16 DIAGNOSIS — I110 Hypertensive heart disease with heart failure: Secondary | ICD-10-CM

## 2020-08-16 DIAGNOSIS — I5022 Chronic systolic (congestive) heart failure: Secondary | ICD-10-CM

## 2020-08-16 MED ORDER — ISOSORBIDE MONONITRATE ER 30 MG PO TB24: ORAL_TABLET | ORAL | 2 refills | Status: DC

## 2020-08-16 NOTE — Patient Instructions (Signed)
Medication Instructions:  Your physician has recommended you make the following change in your medication:   START Isosorbide Mononitrate (Imdur) one '30mg'$  tablet in the morning on non-dialysis days   *If you need a refill on your cardiac medications before your next appointment, please call your pharmacy*   Lab Work: None ordered today.  Recommend having a cholesterol checked with your next fasting lab work.    Testing/Procedures: Your physician has requested that you have an echocardiogram. Echocardiography is a painless test that uses sound waves to create images of your heart. It provides your doctor with information about the size and shape of your heart and how well your heart's chambers and valves are working. This procedure takes approximately one hour. There are no restrictions for this procedure.    Follow-Up: At [facility], you and your health needs are our priority.  As part of our continuing mission to provide you with exceptional heart care, we have created designated Provider Care Teams.  These Care Teams include your primary Cardiologist (physician) and Advanced Practice Providers (APPs -  Physician Assistants and Nurse Practitioners) who all work together to provide you with the care you need, when you need it.  We recommend signing up for the patient portal called "MyChart".  Sign up information is provided on this After Visit Summary.  MyChart is used to connect with patients for Virtual Visits (Telemedicine).  Patients are able to view lab/test results, encounter notes, upcoming appointments, etc.  Non-urgent messages can be sent to your provider as well.   To learn more about what you can do with MyChart, go to [email or web address].    Your next appointment:   10/20/20 at 3pm with Dr. Bettina Gavia at [facility]   Other Instructions  Tips to Measure your Blood Pressure Correctly  To determine whether you have hypertension, a medical professional will take  a blood pressure reading. How you prepare for the test, the position of your arm, and other factors can change a blood pressure reading by 10% or more. That could be enough to hide high blood pressure, start you on a drug you don't really need, or lead your doctor to incorrectly adjust your medications.  National and international guidelines offer specific instructions for measuring blood pressure. If a doctor, nurse, or medical assistant isn't doing it right, don't hesitate to ask him or her to get with the guidelines.  Here's what you can do to ensure a correct reading:  Don't drink a caffeinated beverage or smoke during the 30 minutes before the test.  Sit quietly for five minutes before the test begins.  During the measurement, sit in a chair with your feet on the floor and your arm supported so your elbow is at about heart level.  The inflatable part of the cuff should completely cover at least 80% of your upper arm, and the cuff should be placed on bare skin, not over a shirt.  Don't talk during the measurement.  Have your blood pressure measured twice, with a brief break in between. If the readings are different by 5 points or more, have it done a third time.  In 2017, new guidelines from the [employer], the [employer] of Cardiology, and nine other health organizations lowered the diagnosis of high blood pressure to 130/80 mm Hg or higher for all adults. The guidelines also redefined the various blood pressure categories to now include normal, elevated, Stage 1 hypertension, Stage 2 hypertension, and hypertensive crisis (see "Blood  pressure categories").  Blood pressure categories  Blood pressure category SYSTOLIC (upper number)  DIASTOLIC (lower number)  Normal Less than 120 mm Hg and Less than 80 mm Hg  Elevated 120-129 mm Hg and Less than 80 mm Hg  High blood pressure: Stage 1 hypertension 130-139 mm Hg or 80-89 mm Hg  High blood pressure: Stage 2 hypertension 140  mm Hg or higher or 90 mm Hg or higher  Hypertensive crisis (consult your doctor immediately) Higher than 180 mm Hg and/or Higher than 120 mm Hg  Source: American Heart Association and American Stroke Association. For more on getting your blood pressure under control, buy Controlling Your Blood Pressure, a Special Health Report from [facility].   Blood Pressure Log   Date   Time  Blood Pressure  Position  Example: Nov 1 9 AM 124/78 sitting                                                     Please bring your blood pressure cuff to your next office visit.

## 2020-08-17 DIAGNOSIS — N2581 Secondary hyperparathyroidism of renal origin: Secondary | ICD-10-CM | POA: Diagnosis not present

## 2020-08-17 DIAGNOSIS — N186 End stage renal disease: Secondary | ICD-10-CM | POA: Diagnosis not present

## 2020-08-17 DIAGNOSIS — Z992 Dependence on renal dialysis: Secondary | ICD-10-CM | POA: Diagnosis not present

## 2020-08-18 DIAGNOSIS — I6200 Nontraumatic subdural hemorrhage, unspecified: Secondary | ICD-10-CM | POA: Diagnosis not present

## 2020-08-18 DIAGNOSIS — S065X9A Traumatic subdural hemorrhage with loss of consciousness of unspecified duration, initial encounter: Secondary | ICD-10-CM | POA: Diagnosis not present

## 2020-08-18 DIAGNOSIS — Z9889 Other specified postprocedural states: Secondary | ICD-10-CM | POA: Diagnosis not present

## 2020-08-19 DIAGNOSIS — N2581 Secondary hyperparathyroidism of renal origin: Secondary | ICD-10-CM | POA: Diagnosis not present

## 2020-08-19 DIAGNOSIS — N186 End stage renal disease: Secondary | ICD-10-CM | POA: Diagnosis not present

## 2020-08-19 DIAGNOSIS — Z992 Dependence on renal dialysis: Secondary | ICD-10-CM | POA: Diagnosis not present

## 2020-08-21 DIAGNOSIS — N186 End stage renal disease: Secondary | ICD-10-CM | POA: Diagnosis not present

## 2020-08-21 DIAGNOSIS — Z992 Dependence on renal dialysis: Secondary | ICD-10-CM | POA: Diagnosis not present

## 2020-08-21 DIAGNOSIS — N2581 Secondary hyperparathyroidism of renal origin: Secondary | ICD-10-CM | POA: Diagnosis not present

## 2020-08-24 ENCOUNTER — Ambulatory Visit (INDEPENDENT_AMBULATORY_CARE_PROVIDER_SITE_OTHER): Payer: Medicare HMO | Admitting: Physician Assistant

## 2020-08-24 ENCOUNTER — Encounter: Payer: Self-pay | Admitting: Physician Assistant

## 2020-08-24 ENCOUNTER — Other Ambulatory Visit (INDEPENDENT_AMBULATORY_CARE_PROVIDER_SITE_OTHER): Payer: Medicare HMO

## 2020-08-24 VITALS — BP 140/62 | HR 62 | Ht 69.0 in | Wt 186.5 lb

## 2020-08-24 DIAGNOSIS — Z992 Dependence on renal dialysis: Secondary | ICD-10-CM | POA: Diagnosis not present

## 2020-08-24 DIAGNOSIS — N186 End stage renal disease: Secondary | ICD-10-CM | POA: Diagnosis not present

## 2020-08-24 DIAGNOSIS — D649 Anemia, unspecified: Secondary | ICD-10-CM

## 2020-08-24 DIAGNOSIS — N2581 Secondary hyperparathyroidism of renal origin: Secondary | ICD-10-CM | POA: Diagnosis not present

## 2020-08-24 LAB — CBC WITH DIFFERENTIAL/PLATELET
Basophils Absolute: 0.1 10*3/uL (ref 0.0–0.1)
Basophils Relative: 0.8 % (ref 0.0–3.0)
Eosinophils Absolute: 0.2 10*3/uL (ref 0.0–0.7)
Eosinophils Relative: 2.3 % (ref 0.0–5.0)
HCT: 29.5 % — ABNORMAL LOW (ref 39.0–52.0)
Hemoglobin: 10.1 g/dL — ABNORMAL LOW (ref 13.0–17.0)
Lymphocytes Relative: 9.8 % — ABNORMAL LOW (ref 12.0–46.0)
Lymphs Abs: 0.8 10*3/uL (ref 0.7–4.0)
MCHC: 34.2 g/dL (ref 30.0–36.0)
MCV: 84.8 fl (ref 78.0–100.0)
Monocytes Absolute: 0.6 10*3/uL (ref 0.1–1.0)
Monocytes Relative: 7.5 % (ref 3.0–12.0)
Neutro Abs: 6.6 10*3/uL (ref 1.4–7.7)
Neutrophils Relative %: 79.6 % — ABNORMAL HIGH (ref 43.0–77.0)
Platelets: 158 10*3/uL (ref 150.0–400.0)
RBC: 3.48 Mil/uL — ABNORMAL LOW (ref 4.22–5.81)
RDW: 17.2 % — ABNORMAL HIGH (ref 11.5–15.5)
WBC: 8.3 10*3/uL (ref 4.0–10.5)

## 2020-08-24 LAB — IBC + FERRITIN
Ferritin: 1024.9 ng/mL — ABNORMAL HIGH (ref 22.0–322.0)
Iron: 59 ug/dL (ref 42–165)
Saturation Ratios: 20.9 % (ref 20.0–50.0)
TIBC: 282.8 ug/dL (ref 250.0–450.0)
Transferrin: 202 mg/dL — ABNORMAL LOW (ref 212.0–360.0)

## 2020-08-24 LAB — B12 AND FOLATE PANEL
Folate: 7.3 ng/mL (ref >5.9)
Vitamin B-12: 181 pg/mL — ABNORMAL LOW (ref 211–911)

## 2020-08-24 NOTE — Progress Notes (Addendum)
Chief Complaint: Anemia  HPI:    Justin [name] is a 70 year old male with a past medical history of CKD on dialysis, who was referred to me by Reynold Bowen, MD for a complaint of anemia.    04/28/20 hemoglobin 11.5.  07/12/2020 patient admitted for subdural hematoma after a fall.  Hemoglobin that day 8.6.  07/13/2020 patient underwent bur hole procedure for his subdural hematoma.  07/18/2020 hemoglobin 8.9, MCV normal at 85.5.  07/17/2020 fecal occult negative.    Today, patient presents to clinic accompanied by his wife.  Together they explain the patient was found to be anemic during recent hospital admission for subdural hematoma.  Apparently they gave him at least 1 unit of PRBCs there and he remained anemic but stable.  He did have fecal occult studies once daily for 3 days which were negative during his hospitalization.  Since being discharged he has gone back to dialysis and has been told again that he is anemic.  They did send home a fecal occult test which apparently was positive (we do not have these results).  Patient tells me he is feeling fine, no pain, no change in bowel habits, no reflux, no nausea, no vomiting.  He has not seen any black stool or bright red blood.    They describe a colonoscopy last year at [facility] which they think was normal.    Denies fever, chills, weight loss or symptoms that awaken him from sleep.     Past Medical History:  Diagnosis Date   BPH (benign prostatic hyperplasia)    Chronic kidney disease (CKD), stage IV (severe) (HCC)    Diabetes (HCC)    Dysuria    Erectile dysfunction    Hyperlipemia    Hypertension    Pinna disorder, left    Renal disorder    Wears glasses     Past Surgical History:  Procedure Laterality Date   AV FISTULA PLACEMENT Left 04/30/2018   Procedure: CREATION OF RADIOCEPHALIC ARTERIOVENOUS FISTULA LEFT ARM;  Surgeon: Elam Dutch, MD;  Location: [facility] OR;  Service: Vascular;  Laterality: Left;   AV FISTULA  PLACEMENT Left 08/28/2018   Procedure: ARTERIOVENOUS (AV) BRACHIOCEPHALIC FISTULA CREATION LEFT UPPER ARM;  Surgeon: Marty Heck, MD;  Location: [facility] OR;  Service: Vascular;  Laterality: Left;   IR FLUORO GUIDE CV LINE RIGHT  04/29/2018   IR US GUIDE VASC ACCESS RIGHT  04/29/2018   RIGHT/LEFT HEART CATH AND CORONARY ANGIOGRAPHY N/A 05/01/2018   Procedure: RIGHT/LEFT HEART CATH AND CORONARY ANGIOGRAPHY;  Surgeon: Belva Crome, MD;  Location: [facility] LAB;  Service: Cardiovascular;  Laterality: N/A;   subdural hematoma Right 07/2020    Current Outpatient Medications  Medication Sig Dispense Refill   hydrALAZINE (APRESOLINE) 25 MG tablet Take 1 tablet (25 mg total) by mouth 3 (three) times daily. 270 tablet 3   isosorbide mononitrate (IMDUR) 30 MG 24 hr tablet Take one '30mg'$  in the tablet on the morning on non-dialysis days. 20 tablet 2   metoprolol succinate (TOPROL-XL) 50 MG 24 hr tablet Take 50 mg by mouth daily.     NOVOLIN 70/30 RELION (70-30) 100 UNIT/ML injection Inject 20 Units into the skin daily. (Patient taking differently: Inject 20 Units into the skin See admin instructions. Inject 20 units subcutaneously with breakfast on Sunday, Monday, Wednesday, Friday; inject 20 units with lunch on Tuesday, Thursday, Saturday (dialysis days)) 10 mL 0   No current facility-administered medications for this visit.  Allergies as of 08/24/2020   (No Known Allergies)    Family History  Problem Relation Age of Onset   Stroke Mother    Diabetes Father     Social History   Socioeconomic History   Marital status: Divorced    Spouse name: Not on file   Number of children: 1   Years of education: Not on file   Highest education level: Not on file  Occupational History   Not on file  Tobacco Use   Smoking status: Never   Smokeless tobacco: Never  Vaping Use   Vaping Use: Never used  Substance and Sexual Activity   Alcohol use: Not Currently   Drug use: Never   Sexual  activity: Not Currently  Other Topics Concern   Not on file  Social History Narrative   Not on file   Social Determinants of Health   Financial Resource Strain: Not on file  Food Insecurity: Not on file  Transportation Needs: Not on file  Physical Activity: Not on file  Stress: Not on file  Social Connections: Not on file  Intimate Partner Violence: Not on file    Review of Systems:    Constitutional: No weight loss, fever or chills Skin: No rash Cardiovascular: No chest pain Respiratory: No SOB  Gastrointestinal: See HPI and otherwise negative Genitourinary: No dysuria  Neurological: No headache Musculoskeletal: No new muscle or joint pain Hematologic: No bleeding Psychiatric: No history of depression or anxiety   Physical Exam:  Vital signs: BP 140/62   Pulse 62   Ht '5\' 9"'$  (1.753 m)   Wt 186 lb 8 oz (84.6 kg)   BMI 27.54 kg/m    Constitutional:   Pleasant Caucasian male appears to be in NAD, Well developed, Well nourished, alert and cooperative Head:  Normocephalic and atraumatic. Eyes:   PEERL, EOMI. No icterus. Conjunctiva pink. Ears:  Normal auditory acuity. Neck:  Supple Throat: Oral cavity and pharynx without inflammation, swelling or lesion.  Respiratory: Respirations even and unlabored. Lungs clear to auscultation bilaterally.   No wheezes, crackles, or rhonchi.  Cardiovascular: Normal S1, S2. No MRG. Regular rate and rhythm. No peripheral edema, cyanosis or pallor.  Gastrointestinal:  Soft, nondistended, nontender. No rebound or guarding. Normal bowel sounds. No appreciable masses or hepatomegaly. Rectal:  Not performed.  Msk:  Symmetrical without gross deformities. Without edema, no deformity or joint abnormality.  Neurologic:  Alert and  oriented x4;  grossly normal neurologically.  Skin:   Dry and intact without significant lesions or rashes. Psychiatric: Demonstrates good judgement and reason without abnormal affect or behaviors.  See HPI for recent  labs.  Assessment: 1.  Anemia: Normocytic anemia from recent labs, patient became anemic during recent hospitalization for subdural hematoma after a fall, recent colonoscopy last year, no GI symptoms; consider blood loss from hematoma versus less likely GI cause 2.  ESRD on HD  Plan: 1.  Ordered labs to include a CBC, iron studies and ferritin as well as B12 and folate today 2.  We will request colonoscopy report from [facility] last year. 3.  Discussed with patient that if he is truly iron deficient/anemic then we may need to consider an EGD as an next part of his work-up. 4.  Patient to follow in clinic per recommendations after time of his labs.  He was assigned to Dr. Candis Schatz today.  Ellouise Newer, PA-C [facility] 08/24/2020, 3:24 PM  Cc: Reynold Bowen, MD   Addendum  4:02 PM 08/26/2020  Received colonoscopy records.  11/24/2019 colonoscopy with adequate prep quality.  Moderate diverticulosis in the left colon, single polyp in the descending colon removed with cold snare and a single polyp in the sigmoid colon removed with cold snare.  Pathology showed tubular adenomas and patient was told to repeat in 3 years.  Will let my nurse know and make sure he is in recall for 11/24/2022.  Ellouise Newer, PA-C

## 2020-08-24 NOTE — Patient Instructions (Signed)
Your provider has requested that you go to the basement level for lab work before leaving today. Press "B" on the elevator. The lab is located at the first door on the left as you exit the elevator.  If you are age 69 or older, your body mass index should be between 23-30. Your Body mass index is 27.54 kg/m. If this is out of the aforementioned range listed, please consider follow up with your Primary Care Provider.  If you are age 81 or younger, your body mass index should be between 19-25. Your Body mass index is 27.54 kg/m. If this is out of the aformentioned range listed, please consider follow up with your Primary Care Provider.   __________________________________________________________  The [facility] providers would like to encourage you to use [facility] to communicate with providers for non-urgent requests or questions.  Due to long hold times on the telephone, sending your provider a message by [facility] may be a faster and more efficient way to get a response.  Please allow 48 business hours for a response.  Please remember that this is for non-urgent requests.

## 2020-08-25 ENCOUNTER — Other Ambulatory Visit: Payer: Self-pay

## 2020-08-25 DIAGNOSIS — N186 End stage renal disease: Secondary | ICD-10-CM

## 2020-08-25 DIAGNOSIS — E538 Deficiency of other specified B group vitamins: Secondary | ICD-10-CM

## 2020-08-25 DIAGNOSIS — D649 Anemia, unspecified: Secondary | ICD-10-CM

## 2020-08-25 NOTE — Progress Notes (Signed)
Agree with the assessment and plan as outlined by Jennifer Lemmon, PA-C. ? ?Kristoffer Bala E. Verlinda Slotnick, MD ? ?

## 2020-08-26 DIAGNOSIS — Z992 Dependence on renal dialysis: Secondary | ICD-10-CM | POA: Diagnosis not present

## 2020-08-26 DIAGNOSIS — N2581 Secondary hyperparathyroidism of renal origin: Secondary | ICD-10-CM | POA: Diagnosis not present

## 2020-08-26 DIAGNOSIS — N186 End stage renal disease: Secondary | ICD-10-CM | POA: Diagnosis not present

## 2020-08-28 DIAGNOSIS — N186 End stage renal disease: Secondary | ICD-10-CM | POA: Diagnosis not present

## 2020-08-28 DIAGNOSIS — N2581 Secondary hyperparathyroidism of renal origin: Secondary | ICD-10-CM | POA: Diagnosis not present

## 2020-08-28 DIAGNOSIS — Z992 Dependence on renal dialysis: Secondary | ICD-10-CM | POA: Diagnosis not present

## 2020-08-31 DIAGNOSIS — Z992 Dependence on renal dialysis: Secondary | ICD-10-CM | POA: Diagnosis not present

## 2020-08-31 DIAGNOSIS — N2581 Secondary hyperparathyroidism of renal origin: Secondary | ICD-10-CM | POA: Diagnosis not present

## 2020-08-31 DIAGNOSIS — N186 End stage renal disease: Secondary | ICD-10-CM | POA: Diagnosis not present

## 2020-09-01 DIAGNOSIS — N186 End stage renal disease: Secondary | ICD-10-CM | POA: Diagnosis not present

## 2020-09-01 DIAGNOSIS — Z992 Dependence on renal dialysis: Secondary | ICD-10-CM | POA: Diagnosis not present

## 2020-09-01 DIAGNOSIS — E1129 Type 2 diabetes mellitus with other diabetic kidney complication: Secondary | ICD-10-CM | POA: Diagnosis not present

## 2020-09-02 DIAGNOSIS — N186 End stage renal disease: Secondary | ICD-10-CM | POA: Diagnosis not present

## 2020-09-02 DIAGNOSIS — N2581 Secondary hyperparathyroidism of renal origin: Secondary | ICD-10-CM | POA: Diagnosis not present

## 2020-09-02 DIAGNOSIS — Z992 Dependence on renal dialysis: Secondary | ICD-10-CM | POA: Diagnosis not present

## 2020-09-03 ENCOUNTER — Ambulatory Visit (INDEPENDENT_AMBULATORY_CARE_PROVIDER_SITE_OTHER): Payer: Medicare HMO

## 2020-09-03 ENCOUNTER — Other Ambulatory Visit: Payer: Self-pay

## 2020-09-03 DIAGNOSIS — I25118 Atherosclerotic heart disease of native coronary artery with other forms of angina pectoris: Secondary | ICD-10-CM | POA: Diagnosis not present

## 2020-09-03 DIAGNOSIS — I350 Nonrheumatic aortic (valve) stenosis: Secondary | ICD-10-CM | POA: Diagnosis not present

## 2020-09-03 DIAGNOSIS — I10 Essential (primary) hypertension: Secondary | ICD-10-CM

## 2020-09-03 LAB — ECHOCARDIOGRAM COMPLETE
AR max vel: 1.27 cm2
AV Area VTI: 1.18 cm2
AV Area mean vel: 1.24 cm2
AV Mean grad: 18 mmHg
AV Peak grad: 29.9 mmHg
Ao pk vel: 2.73 m/s
Area-P 1/2: 3.13 cm2
Calc EF: 53.8 %
S' Lateral: 3.7 cm
Single Plane A2C EF: 49.9 %
Single Plane A4C EF: 51.5 %

## 2020-09-04 DIAGNOSIS — N2581 Secondary hyperparathyroidism of renal origin: Secondary | ICD-10-CM | POA: Diagnosis not present

## 2020-09-04 DIAGNOSIS — N186 End stage renal disease: Secondary | ICD-10-CM | POA: Diagnosis not present

## 2020-09-04 DIAGNOSIS — Z992 Dependence on renal dialysis: Secondary | ICD-10-CM | POA: Diagnosis not present

## 2020-09-07 DIAGNOSIS — N186 End stage renal disease: Secondary | ICD-10-CM | POA: Diagnosis not present

## 2020-09-07 DIAGNOSIS — Z992 Dependence on renal dialysis: Secondary | ICD-10-CM | POA: Diagnosis not present

## 2020-09-07 DIAGNOSIS — N2581 Secondary hyperparathyroidism of renal origin: Secondary | ICD-10-CM | POA: Diagnosis not present

## 2020-09-08 ENCOUNTER — Telehealth: Payer: Self-pay

## 2020-09-08 DIAGNOSIS — I350 Nonrheumatic aortic (valve) stenosis: Secondary | ICD-10-CM

## 2020-09-08 NOTE — Telephone Encounter (Signed)
The patient's girlfriend has been notified of the result and verbalized understanding.  All questions (if any) were answered. Antonieta Iba, RN 09/08/2020 5:07 PM  Echo has been ordered. Will send message to [facility] team to schedule.

## 2020-09-08 NOTE — Telephone Encounter (Signed)
-----   Message from Loel Dubonnet, NP sent at 09/07/2020  4:26 PM EDT ----- As previously resulted: Echo with heart pumping function slightly improved compared to previous but still low. Moderate thickening of heart muscle and mild stiffness indicates need for improved control of blood pressure. Mildly leaky mitral valve. Mild to moderately stiff aortic valve. Will need annual echo for monitoring. Abdomina aorta dilated, recommend abdominal aorta duplex.  Addendum: Given small to moderate pericardial effusion Dr. Stanford Breed has recommended a repeat echocardiogram to be performed 10/18/20 or 10/19/20 at the [place] office. Please facilitate to order/schedule.

## 2020-09-09 DIAGNOSIS — Z992 Dependence on renal dialysis: Secondary | ICD-10-CM | POA: Diagnosis not present

## 2020-09-09 DIAGNOSIS — N2581 Secondary hyperparathyroidism of renal origin: Secondary | ICD-10-CM | POA: Diagnosis not present

## 2020-09-09 DIAGNOSIS — N186 End stage renal disease: Secondary | ICD-10-CM | POA: Diagnosis not present

## 2020-09-11 DIAGNOSIS — N2581 Secondary hyperparathyroidism of renal origin: Secondary | ICD-10-CM | POA: Diagnosis not present

## 2020-09-11 DIAGNOSIS — N186 End stage renal disease: Secondary | ICD-10-CM | POA: Diagnosis not present

## 2020-09-11 DIAGNOSIS — Z992 Dependence on renal dialysis: Secondary | ICD-10-CM | POA: Diagnosis not present

## 2020-09-14 DIAGNOSIS — N2581 Secondary hyperparathyroidism of renal origin: Secondary | ICD-10-CM | POA: Diagnosis not present

## 2020-09-14 DIAGNOSIS — Z992 Dependence on renal dialysis: Secondary | ICD-10-CM | POA: Diagnosis not present

## 2020-09-14 DIAGNOSIS — N186 End stage renal disease: Secondary | ICD-10-CM | POA: Diagnosis not present

## 2020-09-16 DIAGNOSIS — Z992 Dependence on renal dialysis: Secondary | ICD-10-CM | POA: Diagnosis not present

## 2020-09-16 DIAGNOSIS — N186 End stage renal disease: Secondary | ICD-10-CM | POA: Diagnosis not present

## 2020-09-16 DIAGNOSIS — N2581 Secondary hyperparathyroidism of renal origin: Secondary | ICD-10-CM | POA: Diagnosis not present

## 2020-09-18 DIAGNOSIS — Z992 Dependence on renal dialysis: Secondary | ICD-10-CM | POA: Diagnosis not present

## 2020-09-18 DIAGNOSIS — N186 End stage renal disease: Secondary | ICD-10-CM | POA: Diagnosis not present

## 2020-09-18 DIAGNOSIS — N2581 Secondary hyperparathyroidism of renal origin: Secondary | ICD-10-CM | POA: Diagnosis not present

## 2020-09-21 DIAGNOSIS — Z992 Dependence on renal dialysis: Secondary | ICD-10-CM | POA: Diagnosis not present

## 2020-09-21 DIAGNOSIS — N186 End stage renal disease: Secondary | ICD-10-CM | POA: Diagnosis not present

## 2020-09-21 DIAGNOSIS — N2581 Secondary hyperparathyroidism of renal origin: Secondary | ICD-10-CM | POA: Diagnosis not present

## 2020-09-23 DIAGNOSIS — N2581 Secondary hyperparathyroidism of renal origin: Secondary | ICD-10-CM | POA: Diagnosis not present

## 2020-09-23 DIAGNOSIS — Z992 Dependence on renal dialysis: Secondary | ICD-10-CM | POA: Diagnosis not present

## 2020-09-23 DIAGNOSIS — N186 End stage renal disease: Secondary | ICD-10-CM | POA: Diagnosis not present

## 2020-09-25 DIAGNOSIS — N186 End stage renal disease: Secondary | ICD-10-CM | POA: Diagnosis not present

## 2020-09-25 DIAGNOSIS — N2581 Secondary hyperparathyroidism of renal origin: Secondary | ICD-10-CM | POA: Diagnosis not present

## 2020-09-25 DIAGNOSIS — Z992 Dependence on renal dialysis: Secondary | ICD-10-CM | POA: Diagnosis not present

## 2020-09-27 ENCOUNTER — Telehealth: Payer: Self-pay

## 2020-09-27 NOTE — Telephone Encounter (Signed)
Spoke with patient's friend to remind her that patient is due for repeat labs at this time. No appointment is necessary. Justin [name] is aware that they can stop by the lab in the basement at their convenience between 7:30 AM - 5 PM, Monday through Friday. I have provided her with the office address. [name] verbalized understanding and had no concerns at the end of the call.

## 2020-09-27 NOTE — Telephone Encounter (Signed)
-----   Message from Yevette Edwards, RN sent at 08/25/2020 10:13 AM EDT ----- Regarding: Labs CBC and B12, orders in epic

## 2020-09-28 DIAGNOSIS — N186 End stage renal disease: Secondary | ICD-10-CM | POA: Diagnosis not present

## 2020-09-28 DIAGNOSIS — Z992 Dependence on renal dialysis: Secondary | ICD-10-CM | POA: Diagnosis not present

## 2020-09-28 DIAGNOSIS — N2581 Secondary hyperparathyroidism of renal origin: Secondary | ICD-10-CM | POA: Diagnosis not present

## 2020-09-30 DIAGNOSIS — Z992 Dependence on renal dialysis: Secondary | ICD-10-CM | POA: Diagnosis not present

## 2020-09-30 DIAGNOSIS — N186 End stage renal disease: Secondary | ICD-10-CM | POA: Diagnosis not present

## 2020-09-30 DIAGNOSIS — N2581 Secondary hyperparathyroidism of renal origin: Secondary | ICD-10-CM | POA: Diagnosis not present

## 2020-10-01 ENCOUNTER — Ambulatory Visit: Payer: Medicare HMO | Admitting: Physician Assistant

## 2020-10-01 DIAGNOSIS — Z992 Dependence on renal dialysis: Secondary | ICD-10-CM | POA: Diagnosis not present

## 2020-10-01 DIAGNOSIS — N186 End stage renal disease: Secondary | ICD-10-CM | POA: Diagnosis not present

## 2020-10-01 DIAGNOSIS — E1129 Type 2 diabetes mellitus with other diabetic kidney complication: Secondary | ICD-10-CM | POA: Diagnosis not present

## 2020-10-05 DIAGNOSIS — N186 End stage renal disease: Secondary | ICD-10-CM | POA: Diagnosis not present

## 2020-10-05 DIAGNOSIS — Z992 Dependence on renal dialysis: Secondary | ICD-10-CM | POA: Diagnosis not present

## 2020-10-05 DIAGNOSIS — N2581 Secondary hyperparathyroidism of renal origin: Secondary | ICD-10-CM | POA: Diagnosis not present

## 2020-10-07 DIAGNOSIS — N2581 Secondary hyperparathyroidism of renal origin: Secondary | ICD-10-CM | POA: Diagnosis not present

## 2020-10-07 DIAGNOSIS — N186 End stage renal disease: Secondary | ICD-10-CM | POA: Diagnosis not present

## 2020-10-07 DIAGNOSIS — Z992 Dependence on renal dialysis: Secondary | ICD-10-CM | POA: Diagnosis not present

## 2020-10-09 DIAGNOSIS — N2581 Secondary hyperparathyroidism of renal origin: Secondary | ICD-10-CM | POA: Diagnosis not present

## 2020-10-09 DIAGNOSIS — Z992 Dependence on renal dialysis: Secondary | ICD-10-CM | POA: Diagnosis not present

## 2020-10-09 DIAGNOSIS — N186 End stage renal disease: Secondary | ICD-10-CM | POA: Diagnosis not present

## 2020-10-12 DIAGNOSIS — N2581 Secondary hyperparathyroidism of renal origin: Secondary | ICD-10-CM | POA: Diagnosis not present

## 2020-10-12 DIAGNOSIS — Z992 Dependence on renal dialysis: Secondary | ICD-10-CM | POA: Diagnosis not present

## 2020-10-12 DIAGNOSIS — N186 End stage renal disease: Secondary | ICD-10-CM | POA: Diagnosis not present

## 2020-10-14 DIAGNOSIS — Z992 Dependence on renal dialysis: Secondary | ICD-10-CM | POA: Diagnosis not present

## 2020-10-14 DIAGNOSIS — N2581 Secondary hyperparathyroidism of renal origin: Secondary | ICD-10-CM | POA: Diagnosis not present

## 2020-10-14 DIAGNOSIS — N186 End stage renal disease: Secondary | ICD-10-CM | POA: Diagnosis not present

## 2020-10-16 DIAGNOSIS — N2581 Secondary hyperparathyroidism of renal origin: Secondary | ICD-10-CM | POA: Diagnosis not present

## 2020-10-16 DIAGNOSIS — N186 End stage renal disease: Secondary | ICD-10-CM | POA: Diagnosis not present

## 2020-10-16 DIAGNOSIS — Z992 Dependence on renal dialysis: Secondary | ICD-10-CM | POA: Diagnosis not present

## 2020-10-18 ENCOUNTER — Telehealth: Payer: Self-pay | Admitting: Cardiology

## 2020-10-18 ENCOUNTER — Other Ambulatory Visit: Payer: Self-pay

## 2020-10-18 ENCOUNTER — Ambulatory Visit (INDEPENDENT_AMBULATORY_CARE_PROVIDER_SITE_OTHER): Payer: Medicare HMO

## 2020-10-18 DIAGNOSIS — H61102 Unspecified noninfective disorders of pinna, left ear: Secondary | ICD-10-CM | POA: Insufficient documentation

## 2020-10-18 DIAGNOSIS — N289 Disorder of kidney and ureter, unspecified: Secondary | ICD-10-CM | POA: Insufficient documentation

## 2020-10-18 DIAGNOSIS — I10 Essential (primary) hypertension: Secondary | ICD-10-CM | POA: Insufficient documentation

## 2020-10-18 DIAGNOSIS — Z973 Presence of spectacles and contact lenses: Secondary | ICD-10-CM | POA: Insufficient documentation

## 2020-10-18 DIAGNOSIS — R300 Dysuria: Secondary | ICD-10-CM | POA: Insufficient documentation

## 2020-10-18 DIAGNOSIS — N529 Male erectile dysfunction, unspecified: Secondary | ICD-10-CM | POA: Insufficient documentation

## 2020-10-18 DIAGNOSIS — N400 Enlarged prostate without lower urinary tract symptoms: Secondary | ICD-10-CM | POA: Insufficient documentation

## 2020-10-18 DIAGNOSIS — I350 Nonrheumatic aortic (valve) stenosis: Secondary | ICD-10-CM

## 2020-10-18 HISTORY — DX: Disorder of kidney and ureter, unspecified: N28.9

## 2020-10-18 LAB — ECHOCARDIOGRAM COMPLETE
AR max vel: 1.05 cm2
AV Area VTI: 1.06 cm2
AV Area mean vel: 1.13 cm2
AV Mean grad: 15.5 mmHg
AV Peak grad: 27.5 mmHg
Ao pk vel: 2.62 m/s
Area-P 1/2: 5.66 cm2
Calc EF: 55.3 %
S' Lateral: 4.2 cm
Single Plane A2C EF: 56.9 %
Single Plane A4C EF: 54.9 %

## 2020-10-18 NOTE — Telephone Encounter (Signed)
Created in error

## 2020-10-19 DIAGNOSIS — N186 End stage renal disease: Secondary | ICD-10-CM | POA: Diagnosis not present

## 2020-10-19 DIAGNOSIS — Z992 Dependence on renal dialysis: Secondary | ICD-10-CM | POA: Diagnosis not present

## 2020-10-19 DIAGNOSIS — N2581 Secondary hyperparathyroidism of renal origin: Secondary | ICD-10-CM | POA: Diagnosis not present

## 2020-10-20 ENCOUNTER — Ambulatory Visit: Payer: Medicare HMO | Admitting: Cardiology

## 2020-10-20 ENCOUNTER — Telehealth: Payer: Self-pay

## 2020-10-20 NOTE — Telephone Encounter (Signed)
The patients girlfriend stopped by as she was supposed to meet him for his visit at 3 pm today. The patient has not shown up yet as of this time. The girlfriend tells me that she was supposed to pick him up but when she got to the house his truck was gone and he was not home. She rushed here to the office thinking he was coming here but he never showed up. She is very upset about this. She is demanding to speak with Dr. Bettina Gavia as she wants to know if the patient is able to get back on the transplant list for a kidney transplant. I discussed this with Dr. Bettina Gavia and he said that he did not feel comfortable with speaking with the girlfriend or advising on this situation since the patient was not here and we have never seen the patient before. However, if the patient shows up at a reasonable time Dr. Bettina Gavia said he would be glad to see him even though he is very late. I let the girlfriend know this and she was very unhappy with this. She stated "Well I guess I rushed here for nothing then" and pulled her keys out of her purse to leave. She did go ahead and reschedule the patients appointment at this time for next week.   I advised that if she could not find the patient or did not hear from him she may need to call the police.

## 2020-10-20 NOTE — Progress Notes (Deleted)
Cardiology Office Note:    Date:  10/20/2020   ID:  Justin [name], DOB December 20, 1950, MRN [id number]  PCP:  Reynold Bowen, MD  Cardiologist:  Shirlee More, MD    Referring MD: Reynold Bowen, MD    ASSESSMENT:    No diagnosis found. PLAN:    In order of problems listed above:  ***   Next appointment: ***   Medication Adjustments/Labs and Tests Ordered: Current medicines are reviewed at length with the patient today.  Concerns regarding medicines are outlined above.  No orders of the defined types were placed in this encounter.  No orders of the defined types were placed in this encounter.   No chief complaint on file.   History of Present Illness:    Justin [name] is a 70 y.o. male with a hx of hypertensive heart and kidney disease with diastolic heart failure stage V CKD with renal replacement therapy CAD hyperlipidemia and subdural hematoma  Last seen 08/16/2020 Laurann [place] nurse practitioner.  Hospitalized April 2020 with hypertensive urgency and congestive heart failure. Echo 04/2018 with LVEF 35-40%, moderate LV enlargement, moderate LA enlargement, small pericardial effusion, moderate aortic stenosis. LHC with 50-60% mid LAD, apical 90% LAD, 75% first diagonal, 75% Cx, occluded first marginal, 50% RCA, 95% PDA. RHC with mild aortic stenosis. He was recommended for medical therapy.   He was seen by cardiology at [place] for evaluation of minimally positive nuclear stress test (mild inducible ischemia of apical inferolateral wall) as part of pre transplant evaluation. LHC 04/28/20 with LAD showing mild plaque (25% proximal, 20% distal), D1 20% proximal plaque, ramus 10% prox plaque, RCA 45% mid, 75% prox focal stenosis in long PDA, small right PL 60-70% plaque. Recommended for medical therapy  He was seen in the ED 07/12/20 with subacute on chronic R SDH after fall 1 week prior. He went to the OR 07/13/20 for R burr hold for SDH evaluation. He received 1u PRBC for Hb <7.   Compliance with diet, lifestyle and medications: ***  Most recent echocardiogram 10/18/2020 shows moderate concentric LVH normal left ventricular size low normal ejection fraction 50 to 55% with elevated left atrial pressure.  Right ventricular systolic function was mildly reduced with normal pulmonary artery pressure small pericardial effusion is present there is mild mitral regurgitation mild aortic valve stenosis with peak and mean gradients of 28 and 16 mmHg mild enlargement of the ascending aorta.  Past Medical History:  Diagnosis Date   BPH (benign prostatic hyperplasia)    Chronic kidney disease (CKD), stage IV (severe) (HCC)    Diabetes (HCC)    Dysuria    Erectile dysfunction    Hyperlipemia    Hypertension    Pinna disorder, left    Renal disorder    Wears glasses     Past Surgical History:  Procedure Laterality Date   AV FISTULA PLACEMENT Left 04/30/2018   Procedure: CREATION OF RADIOCEPHALIC ARTERIOVENOUS FISTULA LEFT ARM;  Surgeon: Elam Dutch, MD;  Location: [facility] OR;  Service: Vascular;  Laterality: Left;   AV FISTULA PLACEMENT Left 08/28/2018   Procedure: ARTERIOVENOUS (AV) BRACHIOCEPHALIC FISTULA CREATION LEFT UPPER ARM;  Surgeon: Marty Heck, MD;  Location: [facility] OR;  Service: Vascular;  Laterality: Left;   IR FLUORO GUIDE CV LINE RIGHT  04/29/2018   IR US GUIDE VASC ACCESS RIGHT  04/29/2018   RIGHT/LEFT HEART CATH AND CORONARY ANGIOGRAPHY N/A 05/01/2018   Procedure: RIGHT/LEFT HEART CATH AND CORONARY ANGIOGRAPHY;  Surgeon: Belva Crome, MD;  Location: [facility];  Service: Cardiovascular;  Laterality: N/A;   subdural hematoma Right 07/2020    Current Medications: No outpatient medications have been marked as taking for the 10/20/20 encounter (Appointment) with Richardo Priest, MD.     Allergies:   Patient has no known allergies.   Social History   Socioeconomic History   Marital status: Divorced    Spouse name: Not on file   Number of  children: 1   Years of education: Not on file   Highest education level: Not on file  Occupational History   Not on file  Tobacco Use   Smoking status: Never   Smokeless tobacco: Never  Vaping Use   Vaping Use: Never used  Substance and Sexual Activity   Alcohol use: Not Currently   Drug use: Never   Sexual activity: Not Currently  Other Topics Concern   Not on file  Social History Narrative   Not on file   Social Determinants of Health   Financial Resource Strain: Not on file  Food Insecurity: Not on file  Transportation Needs: Not on file  Physical Activity: Not on file  Stress: Not on file  Social Connections: Not on file     Family History: The patient's ***family history includes Diabetes in his father; Stroke in his mother. ROS:   Please see the history of present illness.    All other systems reviewed and are negative.  EKGs/Labs/Other Studies Reviewed:    The following studies were reviewed today:  EKG:  EKG ordered today and personally reviewed.  The ekg ordered today demonstrates ***  Recent Labs: 08/24/2020: Hemoglobin 10.1; Platelets 158.0  Recent Lipid Panel    Component Value Date/Time   CHOL 109 04/25/2018 0429   TRIG 73 04/25/2018 0429   HDL 34 (L) 04/25/2018 0429   CHOLHDL 3.2 04/25/2018 0429   VLDL 15 04/25/2018 0429   LDLCALC 60 04/25/2018 0429   LDLDIRECT 32 06/05/2019 1639    Physical Exam:    VS:  There were no vitals taken for this visit.    Wt Readings from Last 3 Encounters:  08/24/20 186 lb 8 oz (84.6 kg)  08/16/20 189 lb 3.2 oz (85.8 kg)  11/12/19 192 lb (87.1 kg)     GEN: *** Well nourished, well developed in no acute distress HEENT: Normal NECK: No JVD; No carotid bruits LYMPHATICS: No lymphadenopathy CARDIAC: ***RRR, no murmurs, rubs, gallops RESPIRATORY:  Clear to auscultation without rales, wheezing or rhonchi  ABDOMEN: Soft, non-tender, non-distended MUSCULOSKELETAL:  No edema; No deformity  SKIN: Warm and  dry NEUROLOGIC:  Alert and oriented x 3 PSYCHIATRIC:  Normal affect    Signed, Shirlee More, MD  10/20/2020 12:44 PM    [place]

## 2020-10-21 DIAGNOSIS — Z992 Dependence on renal dialysis: Secondary | ICD-10-CM | POA: Diagnosis not present

## 2020-10-21 DIAGNOSIS — N186 End stage renal disease: Secondary | ICD-10-CM | POA: Diagnosis not present

## 2020-10-21 DIAGNOSIS — N2581 Secondary hyperparathyroidism of renal origin: Secondary | ICD-10-CM | POA: Diagnosis not present

## 2020-10-23 DIAGNOSIS — N2581 Secondary hyperparathyroidism of renal origin: Secondary | ICD-10-CM | POA: Diagnosis not present

## 2020-10-23 DIAGNOSIS — N186 End stage renal disease: Secondary | ICD-10-CM | POA: Diagnosis not present

## 2020-10-23 DIAGNOSIS — Z992 Dependence on renal dialysis: Secondary | ICD-10-CM | POA: Diagnosis not present

## 2020-10-26 DIAGNOSIS — Z992 Dependence on renal dialysis: Secondary | ICD-10-CM | POA: Diagnosis not present

## 2020-10-26 DIAGNOSIS — N186 End stage renal disease: Secondary | ICD-10-CM | POA: Diagnosis not present

## 2020-10-26 DIAGNOSIS — N2581 Secondary hyperparathyroidism of renal origin: Secondary | ICD-10-CM | POA: Diagnosis not present

## 2020-10-26 NOTE — Progress Notes (Signed)
Cardiology Office Note:    Date:  10/27/2020   ID:  SKYLAN LARA, DOB Dec 04, 1950, MRN [id number]  PCP:  Reynold Bowen, MD  Cardiologist:  Shirlee More, MD    Referring MD: Reynold Bowen, MD    ASSESSMENT:    1. Hypertensive heart disease with congestive heart failure and stage 5 kidney disease ([place])   2. Coronary artery disease of native artery of native heart with stable angina pectoris ([place])   3. Hyperlipidemia LDL goal <70   4. Nonrheumatic aortic valve stenosis   5. Anemia due to stage 5 chronic kidney disease, not on chronic dialysis ([place])   6. RBBB (right bundle branch block with left anterior fascicular block)    PLAN:    In order of problems listed above:  Despite the severity of his heart and kidney disease he continues to do well his heart failure is compensated with ultrafiltration and hemodialysis and is New York Heart Association class I having no angina on medical therapy including oral nitrate and high intensity statin and beta-blocker.  His coronary angiogram showed the presence of CAD but not an indication for CABG and I told him I am just unsure whether or not this will preclude for him to have transplantation is up to the team.  Continue medical treatment at this time I have asked to have a lipid profile and LP(a) level done next hemodialysis labs He has mild aortic stenosis need a repeat echocardiogram in 1 year Anemia is improved managed by the dialysis team I will asked my EP partner to give me an opinion he has first-degree AV block bifascicular heart block and has stage V CKD with renal replacement therapy and certainly is at risk for bradycardia   Next appointment: 1 months   Medication Adjustments/Labs and Tests Ordered: Current medicines are reviewed at length with the patient today.  Concerns regarding medicines are outlined above.  Orders Placed This Encounter  Procedures   Lipid panel   Lipoprotein A (LPA)    Meds ordered this encounter   Medications   nitroGLYCERIN (NITROSTAT) 0.4 MG SL tablet    Sig: Place 1 tablet (0.4 mg total) under the tongue every 5 (five) minutes as needed.    Dispense:  25 tablet    Refill:  11     Chief Complaint  Patient presents with   Follow-up    Records reviewed he has CAD mild aortic stenosis and hypertensive heart disease with diastolic heart failure and stage V CKD with renal replacement therapy.     History of Present Illness:   TAMERON LAMA is a 70 y.o. male with a hx of hypertensive heart and kidney disease with diastolic heart failure stage V CKD with renal replacement therapy CAD hyperlipidemia and subdural hematoma  Last seen 08/16/2020 Laurann [place] nurse practitioner.   Hospitalized April 2020 with hypertensive urgency and congestive heart failure. Echo 04/2018 with LVEF 35-40%, moderate LV enlargement, moderate LA enlargement, small pericardial effusion, moderate aortic stenosis. LHC with 50-60% mid LAD, apical 90% LAD, 75% first diagonal, 75% Cx, occluded first marginal, 50% RCA, 95% PDA. RHC with mild aortic stenosis. He was recommended for medical therapy.    He was seen by cardiology at [facility] for evaluation of minimally positive nuclear stress test (mild inducible ischemia of apical inferolateral wall) as part of pre transplant evaluation. LHC 04/28/20 with LAD showing mild plaque (25% proximal, 20% distal), D1 20% proximal plaque, ramus 10% prox plaque, RCA 45% mid, 75% prox focal  stenosis in long PDA, small right PL 60-70% plaque. Recommended for medical therapy   He was seen in the ED 07/12/20 with subacute on chronic R SDH after fall 1 week prior. He went to the OR 07/13/20 for R burr hold for SDH evaluation. He received 1u PRBC for Hb <7.  Laboratory test from his dialysis center 09/30/2020 shows a hemoglobin 11 hematocrit 33%  Compliance with diet, lifestyle and medications: Yes   Most recent echocardiogram 10/18/2020 shows moderate concentric LVH normal left  ventricular size low normal ejection fraction 50 to 55% with elevated left atrial pressure.  Right ventricular systolic function was mildly reduced with normal pulmonary artery pressure small pericardial effusion is present there is mild mitral regurgitation mild aortic valve stenosis with peak and mean gradients of 28 and 16 mmHg mild enlargement of the ascending aorta.  His significant other is with him today participates in evaluation decision making. The decision for transplantation is being deferred to let him recover from his subdural hematoma He is losing weight and has a poor appetite at times he vomits certainly is at risk for diabetic gastroparesis. He is not having edema shortness of breath chest pain palpitation or syncope. We discussed nitroglycerin usage and alcohol bottle in case its need to use it EKG performed 08/16/2020 showed sinus rhythm bifascicular heart block first-degree AV block Past Medical History:  Diagnosis Date   BPH (benign prostatic hyperplasia)    Chronic kidney disease (CKD), stage IV (severe) (HCC)    Diabetes (HCC)    Dysuria    Erectile dysfunction    Hyperlipemia    Hypertension    Pinna disorder, left    Renal disorder    Wears glasses     Past Surgical History:  Procedure Laterality Date   AV FISTULA PLACEMENT Left 04/30/2018   Procedure: CREATION OF RADIOCEPHALIC ARTERIOVENOUS FISTULA LEFT ARM;  Surgeon: Elam Dutch, MD;  Location: [facility] OR;  Service: Vascular;  Laterality: Left;   AV FISTULA PLACEMENT Left 08/28/2018   Procedure: ARTERIOVENOUS (AV) BRACHIOCEPHALIC FISTULA CREATION LEFT UPPER ARM;  Surgeon: Marty Heck, MD;  Location: [facility] OR;  Service: Vascular;  Laterality: Left;   IR FLUORO GUIDE CV LINE RIGHT  04/29/2018   IR US GUIDE VASC ACCESS RIGHT  04/29/2018   RIGHT/LEFT HEART CATH AND CORONARY ANGIOGRAPHY N/A 05/01/2018   Procedure: RIGHT/LEFT HEART CATH AND CORONARY ANGIOGRAPHY;  Surgeon: Belva Crome, MD;  Location: [facility];  Service: Cardiovascular;  Laterality: N/A;   subdural hematoma Right 07/2020    Current Medications: Current Meds  Medication Sig   calcium acetate (PHOSLO) 667 MG capsule Take 2,001 mg by mouth 3 (three) times daily.   hydrALAZINE (APRESOLINE) 25 MG tablet Take 1 tablet (25 mg total) by mouth 3 (three) times daily. (Patient taking differently: Take 25 mg by mouth See admin instructions. Take 2 tablets 3xs a day tues, thur,sat)   isosorbide mononitrate (IMDUR) 30 MG 24 hr tablet Take one 30mg  in the tablet on the morning on non-dialysis days. (Patient taking differently: Take 30 mg by mouth See admin instructions. Take one 30mg  in the tablet on the morning on non-dialysis days.)   metoprolol succinate (TOPROL-XL) 50 MG 24 hr tablet Take 50 mg by mouth daily. Take 2 tablets 2xs a day mon,wed,fri,sun   nitroGLYCERIN (NITROSTAT) 0.4 MG SL tablet Place 1 tablet (0.4 mg total) under the tongue every 5 (five) minutes as needed.   NOVOLIN 70/30 RELION (70-30) 100 UNIT/ML injection  Inject 20 Units into the skin daily. (Patient taking differently: Inject 20 Units into the skin See admin instructions. Inject 20 units subcutaneously with breakfast on Sunday, Monday, Wednesday, Friday; inject 20 units with lunch on Tuesday, Thursday, Saturday (dialysis days))   rosuvastatin (CRESTOR) 10 MG tablet Take 10 mg by mouth daily.     Allergies:   Patient has no known allergies.   Social History   Socioeconomic History   Marital status: Divorced    Spouse name: Not on file   Number of children: 1   Years of education: Not on file   Highest education level: Not on file  Occupational History   Not on file  Tobacco Use   Smoking status: Never   Smokeless tobacco: Never  Vaping Use   Vaping Use: Never used  Substance and Sexual Activity   Alcohol use: Not Currently   Drug use: Never   Sexual activity: Not Currently  Other Topics Concern   Not on file  Social History Narrative   Not  on file   Social Determinants of Health   Financial Resource Strain: Not on file  Food Insecurity: Not on file  Transportation Needs: Not on file  Physical Activity: Not on file  Stress: Not on file  Social Connections: Not on file     Family History: The patient's family history includes Diabetes in his father; Stroke in his mother. ROS:   Please see the history of present illness.    All other systems reviewed and are negative.  EKGs/Labs/Other Studies Reviewed:    The following studies were reviewed today:   Recent Labs: 08/24/2020: Hemoglobin 10.1; Platelets 158.0  Recent Lipid Panel    Component Value Date/Time   CHOL 109 04/25/2018 0429   TRIG 73 04/25/2018 0429   HDL 34 (L) 04/25/2018 0429   CHOLHDL 3.2 04/25/2018 0429   VLDL 15 04/25/2018 0429   LDLCALC 60 04/25/2018 0429   LDLDIRECT 32 06/05/2019 1639    Physical Exam:    VS:  BP (!) 116/54 (BP Location: Right Arm, Patient Position: Sitting)   Pulse 62   Ht 5\' 9"  (1.753 m)   Wt 173 lb (78.5 kg)   SpO2 96%   BMI 25.55 kg/m     Wt Readings from Last 3 Encounters:  10/27/20 173 lb (78.5 kg)  08/24/20 186 lb 8 oz (84.6 kg)  08/16/20 189 lb 3.2 oz (85.8 kg)     GEN:  Well nourished, well developed in no acute distress he does not look chronically ill or debilitated HEENT: Normal NECK: No JVD; No carotid bruits LYMPHATICS: No lymphadenopathy CARDIAC: He has a systolic ejection murmur 2 of 6 located aortic area S2 is normal no aortic regurgitation RRR,  RESPIRATORY:  Clear to auscultation without rales, wheezing or rhonchi  ABDOMEN: Soft, non-tender, non-distended MUSCULOSKELETAL:  No edema; No deformity  SKIN: Warm and dry NEUROLOGIC:  Alert and oriented x 3 PSYCHIATRIC:  Normal affect    Signed, Shirlee More, MD  10/27/2020 11:46 AM    [place]

## 2020-10-27 ENCOUNTER — Encounter: Payer: Self-pay | Admitting: Cardiology

## 2020-10-27 ENCOUNTER — Other Ambulatory Visit: Payer: Self-pay

## 2020-10-27 ENCOUNTER — Ambulatory Visit (INDEPENDENT_AMBULATORY_CARE_PROVIDER_SITE_OTHER): Payer: Medicare HMO | Admitting: Cardiology

## 2020-10-27 VITALS — BP 116/54 | HR 62 | Ht 69.0 in | Wt 173.0 lb

## 2020-10-27 DIAGNOSIS — E785 Hyperlipidemia, unspecified: Secondary | ICD-10-CM | POA: Diagnosis not present

## 2020-10-27 DIAGNOSIS — I25118 Atherosclerotic heart disease of native coronary artery with other forms of angina pectoris: Secondary | ICD-10-CM | POA: Diagnosis not present

## 2020-10-27 DIAGNOSIS — D631 Anemia in chronic kidney disease: Secondary | ICD-10-CM | POA: Diagnosis not present

## 2020-10-27 DIAGNOSIS — N185 Chronic kidney disease, stage 5: Secondary | ICD-10-CM

## 2020-10-27 DIAGNOSIS — I350 Nonrheumatic aortic (valve) stenosis: Secondary | ICD-10-CM

## 2020-10-27 DIAGNOSIS — I452 Bifascicular block: Secondary | ICD-10-CM

## 2020-10-27 DIAGNOSIS — I132 Hypertensive heart and chronic kidney disease with heart failure and with stage 5 chronic kidney disease, or end stage renal disease: Secondary | ICD-10-CM

## 2020-10-27 DIAGNOSIS — N186 End stage renal disease: Secondary | ICD-10-CM | POA: Insufficient documentation

## 2020-10-27 MED ORDER — NITROGLYCERIN 0.4 MG SL SUBL: 0.4000 mg | SUBLINGUAL_TABLET | SUBLINGUAL | 11 refills | Status: DC | PRN

## 2020-10-27 NOTE — Patient Instructions (Signed)
Medication Instructions:  Your physician recommends that you continue on your current medications as directed. Please refer to the Current Medication list given to you today.  *If you need a refill on your cardiac medications before your next appointment, please call your pharmacy*   Lab Work: Your physician recommends that you return for lab work at hemodialysis: LIPID, lpA If you have labs (blood work) drawn today and your tests are completely normal, you will receive your results only by: [place] (if you have MyChart) OR A paper copy in the mail If you have any lab test that is abnormal or we need to change your treatment, we will call you to review the results.   Testing/Procedures: none   Follow-Up: At [facility], you and your health needs are our priority.  As part of our continuing mission to provide you with exceptional heart care, we have created designated Provider Care Teams.  These Care Teams include your primary Cardiologist (physician) and Advanced Practice Providers (APPs -  Physician Assistants and Nurse Practitioners) who all work together to provide you with the care you need, when you need it.  We recommend signing up for the patient portal called "MyChart".  Sign up information is provided on this After Visit Summary.  MyChart is used to connect with patients for Virtual Visits (Telemedicine).  Patients are able to view lab/test results, encounter notes, upcoming appointments, etc.  Non-urgent messages can be sent to your provider as well.   To learn more about what you can do with MyChart, go to [email or web address].    Your next appointment:   4 month(s)  The format for your next appointment:   In Person  Provider:   Shirlee More, MD   Other Instructions

## 2020-10-28 DIAGNOSIS — Z992 Dependence on renal dialysis: Secondary | ICD-10-CM | POA: Diagnosis not present

## 2020-10-28 DIAGNOSIS — N186 End stage renal disease: Secondary | ICD-10-CM | POA: Diagnosis not present

## 2020-10-28 DIAGNOSIS — N2581 Secondary hyperparathyroidism of renal origin: Secondary | ICD-10-CM | POA: Diagnosis not present

## 2020-10-30 DIAGNOSIS — Z992 Dependence on renal dialysis: Secondary | ICD-10-CM | POA: Diagnosis not present

## 2020-10-30 DIAGNOSIS — N186 End stage renal disease: Secondary | ICD-10-CM | POA: Diagnosis not present

## 2020-10-30 DIAGNOSIS — N2581 Secondary hyperparathyroidism of renal origin: Secondary | ICD-10-CM | POA: Diagnosis not present

## 2020-11-01 DIAGNOSIS — Z992 Dependence on renal dialysis: Secondary | ICD-10-CM | POA: Diagnosis not present

## 2020-11-01 DIAGNOSIS — E1129 Type 2 diabetes mellitus with other diabetic kidney complication: Secondary | ICD-10-CM | POA: Diagnosis not present

## 2020-11-01 DIAGNOSIS — N186 End stage renal disease: Secondary | ICD-10-CM | POA: Diagnosis not present

## 2020-11-04 DIAGNOSIS — N2581 Secondary hyperparathyroidism of renal origin: Secondary | ICD-10-CM | POA: Diagnosis not present

## 2020-11-04 DIAGNOSIS — Z992 Dependence on renal dialysis: Secondary | ICD-10-CM | POA: Diagnosis not present

## 2020-11-04 DIAGNOSIS — N186 End stage renal disease: Secondary | ICD-10-CM | POA: Diagnosis not present

## 2020-11-06 DIAGNOSIS — Z992 Dependence on renal dialysis: Secondary | ICD-10-CM | POA: Diagnosis not present

## 2020-11-06 DIAGNOSIS — N2581 Secondary hyperparathyroidism of renal origin: Secondary | ICD-10-CM | POA: Diagnosis not present

## 2020-11-06 DIAGNOSIS — N186 End stage renal disease: Secondary | ICD-10-CM | POA: Diagnosis not present

## 2020-11-09 DIAGNOSIS — Z992 Dependence on renal dialysis: Secondary | ICD-10-CM | POA: Diagnosis not present

## 2020-11-09 DIAGNOSIS — N186 End stage renal disease: Secondary | ICD-10-CM | POA: Diagnosis not present

## 2020-11-09 DIAGNOSIS — N2581 Secondary hyperparathyroidism of renal origin: Secondary | ICD-10-CM | POA: Diagnosis not present

## 2020-11-11 DIAGNOSIS — Z992 Dependence on renal dialysis: Secondary | ICD-10-CM | POA: Diagnosis not present

## 2020-11-11 DIAGNOSIS — N186 End stage renal disease: Secondary | ICD-10-CM | POA: Diagnosis not present

## 2020-11-11 DIAGNOSIS — N2581 Secondary hyperparathyroidism of renal origin: Secondary | ICD-10-CM | POA: Diagnosis not present

## 2020-11-13 DIAGNOSIS — N186 End stage renal disease: Secondary | ICD-10-CM | POA: Diagnosis not present

## 2020-11-13 DIAGNOSIS — N2581 Secondary hyperparathyroidism of renal origin: Secondary | ICD-10-CM | POA: Diagnosis not present

## 2020-11-13 DIAGNOSIS — Z992 Dependence on renal dialysis: Secondary | ICD-10-CM | POA: Diagnosis not present

## 2020-11-16 DIAGNOSIS — N2581 Secondary hyperparathyroidism of renal origin: Secondary | ICD-10-CM | POA: Diagnosis not present

## 2020-11-16 DIAGNOSIS — Z992 Dependence on renal dialysis: Secondary | ICD-10-CM | POA: Diagnosis not present

## 2020-11-16 DIAGNOSIS — N186 End stage renal disease: Secondary | ICD-10-CM | POA: Diagnosis not present

## 2020-11-18 DIAGNOSIS — N186 End stage renal disease: Secondary | ICD-10-CM | POA: Diagnosis not present

## 2020-11-18 DIAGNOSIS — N2581 Secondary hyperparathyroidism of renal origin: Secondary | ICD-10-CM | POA: Diagnosis not present

## 2020-11-18 DIAGNOSIS — Z992 Dependence on renal dialysis: Secondary | ICD-10-CM | POA: Diagnosis not present

## 2020-11-20 DIAGNOSIS — N186 End stage renal disease: Secondary | ICD-10-CM | POA: Diagnosis not present

## 2020-11-20 DIAGNOSIS — N2581 Secondary hyperparathyroidism of renal origin: Secondary | ICD-10-CM | POA: Diagnosis not present

## 2020-11-20 DIAGNOSIS — Z992 Dependence on renal dialysis: Secondary | ICD-10-CM | POA: Diagnosis not present

## 2020-11-23 DIAGNOSIS — Z992 Dependence on renal dialysis: Secondary | ICD-10-CM | POA: Diagnosis not present

## 2020-11-23 DIAGNOSIS — N2581 Secondary hyperparathyroidism of renal origin: Secondary | ICD-10-CM | POA: Diagnosis not present

## 2020-11-23 DIAGNOSIS — N186 End stage renal disease: Secondary | ICD-10-CM | POA: Diagnosis not present

## 2020-11-26 ENCOUNTER — Other Ambulatory Visit (HOSPITAL_BASED_OUTPATIENT_CLINIC_OR_DEPARTMENT_OTHER): Payer: Self-pay | Admitting: Family

## 2020-11-26 DIAGNOSIS — I10 Essential (primary) hypertension: Secondary | ICD-10-CM

## 2020-11-26 DIAGNOSIS — I350 Nonrheumatic aortic (valve) stenosis: Secondary | ICD-10-CM

## 2020-11-26 DIAGNOSIS — N2581 Secondary hyperparathyroidism of renal origin: Secondary | ICD-10-CM | POA: Diagnosis not present

## 2020-11-26 DIAGNOSIS — N186 End stage renal disease: Secondary | ICD-10-CM | POA: Diagnosis not present

## 2020-11-26 DIAGNOSIS — I25118 Atherosclerotic heart disease of native coronary artery with other forms of angina pectoris: Secondary | ICD-10-CM

## 2020-11-26 DIAGNOSIS — Z992 Dependence on renal dialysis: Secondary | ICD-10-CM | POA: Diagnosis not present

## 2020-11-29 NOTE — Telephone Encounter (Signed)
Rx request sent to pharmacy.  

## 2020-11-30 DIAGNOSIS — N2581 Secondary hyperparathyroidism of renal origin: Secondary | ICD-10-CM | POA: Diagnosis not present

## 2020-11-30 DIAGNOSIS — Z992 Dependence on renal dialysis: Secondary | ICD-10-CM | POA: Diagnosis not present

## 2020-11-30 DIAGNOSIS — N186 End stage renal disease: Secondary | ICD-10-CM | POA: Diagnosis not present

## 2020-12-01 DIAGNOSIS — E1129 Type 2 diabetes mellitus with other diabetic kidney complication: Secondary | ICD-10-CM | POA: Diagnosis not present

## 2020-12-01 DIAGNOSIS — N186 End stage renal disease: Secondary | ICD-10-CM | POA: Diagnosis not present

## 2020-12-01 DIAGNOSIS — Z992 Dependence on renal dialysis: Secondary | ICD-10-CM | POA: Diagnosis not present

## 2020-12-02 DIAGNOSIS — N2581 Secondary hyperparathyroidism of renal origin: Secondary | ICD-10-CM | POA: Diagnosis not present

## 2020-12-02 DIAGNOSIS — N186 End stage renal disease: Secondary | ICD-10-CM | POA: Diagnosis not present

## 2020-12-02 DIAGNOSIS — Z992 Dependence on renal dialysis: Secondary | ICD-10-CM | POA: Diagnosis not present

## 2020-12-03 DIAGNOSIS — E1129 Type 2 diabetes mellitus with other diabetic kidney complication: Secondary | ICD-10-CM | POA: Diagnosis not present

## 2020-12-03 DIAGNOSIS — I10 Essential (primary) hypertension: Secondary | ICD-10-CM | POA: Diagnosis not present

## 2020-12-03 DIAGNOSIS — E785 Hyperlipidemia, unspecified: Secondary | ICD-10-CM | POA: Diagnosis not present

## 2020-12-03 DIAGNOSIS — Z125 Encounter for screening for malignant neoplasm of prostate: Secondary | ICD-10-CM | POA: Diagnosis not present

## 2020-12-04 DIAGNOSIS — Z992 Dependence on renal dialysis: Secondary | ICD-10-CM | POA: Diagnosis not present

## 2020-12-04 DIAGNOSIS — N186 End stage renal disease: Secondary | ICD-10-CM | POA: Diagnosis not present

## 2020-12-04 DIAGNOSIS — N2581 Secondary hyperparathyroidism of renal origin: Secondary | ICD-10-CM | POA: Diagnosis not present

## 2020-12-07 DIAGNOSIS — N2581 Secondary hyperparathyroidism of renal origin: Secondary | ICD-10-CM | POA: Diagnosis not present

## 2020-12-07 DIAGNOSIS — N186 End stage renal disease: Secondary | ICD-10-CM | POA: Diagnosis not present

## 2020-12-07 DIAGNOSIS — Z992 Dependence on renal dialysis: Secondary | ICD-10-CM | POA: Diagnosis not present

## 2020-12-09 DIAGNOSIS — N186 End stage renal disease: Secondary | ICD-10-CM | POA: Diagnosis not present

## 2020-12-09 DIAGNOSIS — N2581 Secondary hyperparathyroidism of renal origin: Secondary | ICD-10-CM | POA: Diagnosis not present

## 2020-12-09 DIAGNOSIS — Z992 Dependence on renal dialysis: Secondary | ICD-10-CM | POA: Diagnosis not present

## 2020-12-10 DIAGNOSIS — I350 Nonrheumatic aortic (valve) stenosis: Secondary | ICD-10-CM | POA: Diagnosis not present

## 2020-12-10 DIAGNOSIS — N401 Enlarged prostate with lower urinary tract symptoms: Secondary | ICD-10-CM | POA: Diagnosis not present

## 2020-12-10 DIAGNOSIS — Z1212 Encounter for screening for malignant neoplasm of rectum: Secondary | ICD-10-CM | POA: Diagnosis not present

## 2020-12-10 DIAGNOSIS — R82998 Other abnormal findings in urine: Secondary | ICD-10-CM | POA: Diagnosis not present

## 2020-12-10 DIAGNOSIS — E785 Hyperlipidemia, unspecified: Secondary | ICD-10-CM | POA: Diagnosis not present

## 2020-12-10 DIAGNOSIS — E1129 Type 2 diabetes mellitus with other diabetic kidney complication: Secondary | ICD-10-CM | POA: Diagnosis not present

## 2020-12-10 DIAGNOSIS — Z0000 Encounter for general adult medical examination without abnormal findings: Secondary | ICD-10-CM | POA: Diagnosis not present

## 2020-12-10 DIAGNOSIS — N186 End stage renal disease: Secondary | ICD-10-CM | POA: Diagnosis not present

## 2020-12-10 DIAGNOSIS — I700 Atherosclerosis of aorta: Secondary | ICD-10-CM | POA: Diagnosis not present

## 2020-12-10 DIAGNOSIS — I2510 Atherosclerotic heart disease of native coronary artery without angina pectoris: Secondary | ICD-10-CM | POA: Diagnosis not present

## 2020-12-10 DIAGNOSIS — I10 Essential (primary) hypertension: Secondary | ICD-10-CM | POA: Diagnosis not present

## 2020-12-11 DIAGNOSIS — Z992 Dependence on renal dialysis: Secondary | ICD-10-CM | POA: Diagnosis not present

## 2020-12-11 DIAGNOSIS — N2581 Secondary hyperparathyroidism of renal origin: Secondary | ICD-10-CM | POA: Diagnosis not present

## 2020-12-11 DIAGNOSIS — N186 End stage renal disease: Secondary | ICD-10-CM | POA: Diagnosis not present

## 2020-12-14 DIAGNOSIS — N2581 Secondary hyperparathyroidism of renal origin: Secondary | ICD-10-CM | POA: Diagnosis not present

## 2020-12-14 DIAGNOSIS — N186 End stage renal disease: Secondary | ICD-10-CM | POA: Diagnosis not present

## 2020-12-14 DIAGNOSIS — Z992 Dependence on renal dialysis: Secondary | ICD-10-CM | POA: Diagnosis not present

## 2020-12-16 DIAGNOSIS — N186 End stage renal disease: Secondary | ICD-10-CM | POA: Diagnosis not present

## 2020-12-16 DIAGNOSIS — Z992 Dependence on renal dialysis: Secondary | ICD-10-CM | POA: Diagnosis not present

## 2020-12-16 DIAGNOSIS — N2581 Secondary hyperparathyroidism of renal origin: Secondary | ICD-10-CM | POA: Diagnosis not present

## 2020-12-18 DIAGNOSIS — Z992 Dependence on renal dialysis: Secondary | ICD-10-CM | POA: Diagnosis not present

## 2020-12-18 DIAGNOSIS — N186 End stage renal disease: Secondary | ICD-10-CM | POA: Diagnosis not present

## 2020-12-18 DIAGNOSIS — N2581 Secondary hyperparathyroidism of renal origin: Secondary | ICD-10-CM | POA: Diagnosis not present

## 2020-12-21 DIAGNOSIS — Z992 Dependence on renal dialysis: Secondary | ICD-10-CM | POA: Diagnosis not present

## 2020-12-21 DIAGNOSIS — N2581 Secondary hyperparathyroidism of renal origin: Secondary | ICD-10-CM | POA: Diagnosis not present

## 2020-12-21 DIAGNOSIS — N186 End stage renal disease: Secondary | ICD-10-CM | POA: Diagnosis not present

## 2020-12-23 DIAGNOSIS — Z992 Dependence on renal dialysis: Secondary | ICD-10-CM | POA: Diagnosis not present

## 2020-12-23 DIAGNOSIS — N186 End stage renal disease: Secondary | ICD-10-CM | POA: Diagnosis not present

## 2020-12-23 DIAGNOSIS — N2581 Secondary hyperparathyroidism of renal origin: Secondary | ICD-10-CM | POA: Diagnosis not present

## 2020-12-25 DIAGNOSIS — Z992 Dependence on renal dialysis: Secondary | ICD-10-CM | POA: Diagnosis not present

## 2020-12-25 DIAGNOSIS — N2581 Secondary hyperparathyroidism of renal origin: Secondary | ICD-10-CM | POA: Diagnosis not present

## 2020-12-25 DIAGNOSIS — N186 End stage renal disease: Secondary | ICD-10-CM | POA: Diagnosis not present

## 2020-12-28 DIAGNOSIS — Z992 Dependence on renal dialysis: Secondary | ICD-10-CM | POA: Diagnosis not present

## 2020-12-28 DIAGNOSIS — N2581 Secondary hyperparathyroidism of renal origin: Secondary | ICD-10-CM | POA: Diagnosis not present

## 2020-12-28 DIAGNOSIS — N186 End stage renal disease: Secondary | ICD-10-CM | POA: Diagnosis not present

## 2020-12-30 DIAGNOSIS — N2581 Secondary hyperparathyroidism of renal origin: Secondary | ICD-10-CM | POA: Diagnosis not present

## 2020-12-30 DIAGNOSIS — Z992 Dependence on renal dialysis: Secondary | ICD-10-CM | POA: Diagnosis not present

## 2020-12-30 DIAGNOSIS — N186 End stage renal disease: Secondary | ICD-10-CM | POA: Diagnosis not present

## 2020-12-31 ENCOUNTER — Other Ambulatory Visit (HOSPITAL_BASED_OUTPATIENT_CLINIC_OR_DEPARTMENT_OTHER): Payer: Self-pay | Admitting: Cardiology

## 2020-12-31 DIAGNOSIS — I350 Nonrheumatic aortic (valve) stenosis: Secondary | ICD-10-CM

## 2020-12-31 DIAGNOSIS — I25118 Atherosclerotic heart disease of native coronary artery with other forms of angina pectoris: Secondary | ICD-10-CM

## 2020-12-31 DIAGNOSIS — I10 Essential (primary) hypertension: Secondary | ICD-10-CM

## 2020-12-31 NOTE — Telephone Encounter (Signed)
Isosorbide mononitrate 30 mg # 20  X 1 refill sent to   [place], [place] - [address]

## 2021-01-01 DIAGNOSIS — N2581 Secondary hyperparathyroidism of renal origin: Secondary | ICD-10-CM | POA: Diagnosis not present

## 2021-01-01 DIAGNOSIS — E1129 Type 2 diabetes mellitus with other diabetic kidney complication: Secondary | ICD-10-CM | POA: Diagnosis not present

## 2021-01-01 DIAGNOSIS — N186 End stage renal disease: Secondary | ICD-10-CM | POA: Diagnosis not present

## 2021-01-01 DIAGNOSIS — Z992 Dependence on renal dialysis: Secondary | ICD-10-CM | POA: Diagnosis not present

## 2021-01-06 DIAGNOSIS — Z992 Dependence on renal dialysis: Secondary | ICD-10-CM | POA: Diagnosis not present

## 2021-01-06 DIAGNOSIS — N186 End stage renal disease: Secondary | ICD-10-CM | POA: Diagnosis not present

## 2021-01-06 DIAGNOSIS — N2581 Secondary hyperparathyroidism of renal origin: Secondary | ICD-10-CM | POA: Diagnosis not present

## 2021-01-08 DIAGNOSIS — Z992 Dependence on renal dialysis: Secondary | ICD-10-CM | POA: Diagnosis not present

## 2021-01-08 DIAGNOSIS — N186 End stage renal disease: Secondary | ICD-10-CM | POA: Diagnosis not present

## 2021-01-08 DIAGNOSIS — N2581 Secondary hyperparathyroidism of renal origin: Secondary | ICD-10-CM | POA: Diagnosis not present

## 2021-01-11 DIAGNOSIS — Z992 Dependence on renal dialysis: Secondary | ICD-10-CM | POA: Diagnosis not present

## 2021-01-11 DIAGNOSIS — N2581 Secondary hyperparathyroidism of renal origin: Secondary | ICD-10-CM | POA: Diagnosis not present

## 2021-01-11 DIAGNOSIS — N186 End stage renal disease: Secondary | ICD-10-CM | POA: Diagnosis not present

## 2021-01-13 DIAGNOSIS — Z992 Dependence on renal dialysis: Secondary | ICD-10-CM | POA: Diagnosis not present

## 2021-01-13 DIAGNOSIS — N186 End stage renal disease: Secondary | ICD-10-CM | POA: Diagnosis not present

## 2021-01-13 DIAGNOSIS — N2581 Secondary hyperparathyroidism of renal origin: Secondary | ICD-10-CM | POA: Diagnosis not present

## 2021-01-15 DIAGNOSIS — N2581 Secondary hyperparathyroidism of renal origin: Secondary | ICD-10-CM | POA: Diagnosis not present

## 2021-01-15 DIAGNOSIS — N186 End stage renal disease: Secondary | ICD-10-CM | POA: Diagnosis not present

## 2021-01-15 DIAGNOSIS — Z992 Dependence on renal dialysis: Secondary | ICD-10-CM | POA: Diagnosis not present

## 2021-01-18 DIAGNOSIS — N2581 Secondary hyperparathyroidism of renal origin: Secondary | ICD-10-CM | POA: Diagnosis not present

## 2021-01-18 DIAGNOSIS — Z992 Dependence on renal dialysis: Secondary | ICD-10-CM | POA: Diagnosis not present

## 2021-01-18 DIAGNOSIS — N186 End stage renal disease: Secondary | ICD-10-CM | POA: Diagnosis not present

## 2021-01-20 DIAGNOSIS — Z992 Dependence on renal dialysis: Secondary | ICD-10-CM | POA: Diagnosis not present

## 2021-01-20 DIAGNOSIS — N186 End stage renal disease: Secondary | ICD-10-CM | POA: Diagnosis not present

## 2021-01-20 DIAGNOSIS — N2581 Secondary hyperparathyroidism of renal origin: Secondary | ICD-10-CM | POA: Diagnosis not present

## 2021-01-22 DIAGNOSIS — N2581 Secondary hyperparathyroidism of renal origin: Secondary | ICD-10-CM | POA: Diagnosis not present

## 2021-01-22 DIAGNOSIS — N186 End stage renal disease: Secondary | ICD-10-CM | POA: Diagnosis not present

## 2021-01-22 DIAGNOSIS — Z992 Dependence on renal dialysis: Secondary | ICD-10-CM | POA: Diagnosis not present

## 2021-01-25 DIAGNOSIS — N186 End stage renal disease: Secondary | ICD-10-CM | POA: Diagnosis not present

## 2021-01-25 DIAGNOSIS — N2581 Secondary hyperparathyroidism of renal origin: Secondary | ICD-10-CM | POA: Diagnosis not present

## 2021-01-25 DIAGNOSIS — Z992 Dependence on renal dialysis: Secondary | ICD-10-CM | POA: Diagnosis not present

## 2021-01-27 DIAGNOSIS — N186 End stage renal disease: Secondary | ICD-10-CM | POA: Diagnosis not present

## 2021-01-27 DIAGNOSIS — Z992 Dependence on renal dialysis: Secondary | ICD-10-CM | POA: Diagnosis not present

## 2021-01-27 DIAGNOSIS — N2581 Secondary hyperparathyroidism of renal origin: Secondary | ICD-10-CM | POA: Diagnosis not present

## 2021-01-29 DIAGNOSIS — Z992 Dependence on renal dialysis: Secondary | ICD-10-CM | POA: Diagnosis not present

## 2021-01-29 DIAGNOSIS — N2581 Secondary hyperparathyroidism of renal origin: Secondary | ICD-10-CM | POA: Diagnosis not present

## 2021-01-29 DIAGNOSIS — N186 End stage renal disease: Secondary | ICD-10-CM | POA: Diagnosis not present

## 2021-02-01 DIAGNOSIS — E1129 Type 2 diabetes mellitus with other diabetic kidney complication: Secondary | ICD-10-CM | POA: Diagnosis not present

## 2021-02-01 DIAGNOSIS — Z992 Dependence on renal dialysis: Secondary | ICD-10-CM | POA: Diagnosis not present

## 2021-02-01 DIAGNOSIS — N186 End stage renal disease: Secondary | ICD-10-CM | POA: Diagnosis not present

## 2021-02-01 DIAGNOSIS — N2581 Secondary hyperparathyroidism of renal origin: Secondary | ICD-10-CM | POA: Diagnosis not present

## 2021-02-03 DIAGNOSIS — N186 End stage renal disease: Secondary | ICD-10-CM | POA: Diagnosis not present

## 2021-02-03 DIAGNOSIS — Z992 Dependence on renal dialysis: Secondary | ICD-10-CM | POA: Diagnosis not present

## 2021-02-03 DIAGNOSIS — N2581 Secondary hyperparathyroidism of renal origin: Secondary | ICD-10-CM | POA: Diagnosis not present

## 2021-02-05 DIAGNOSIS — N186 End stage renal disease: Secondary | ICD-10-CM | POA: Diagnosis not present

## 2021-02-05 DIAGNOSIS — N2581 Secondary hyperparathyroidism of renal origin: Secondary | ICD-10-CM | POA: Diagnosis not present

## 2021-02-05 DIAGNOSIS — Z992 Dependence on renal dialysis: Secondary | ICD-10-CM | POA: Diagnosis not present

## 2021-02-08 DIAGNOSIS — N2581 Secondary hyperparathyroidism of renal origin: Secondary | ICD-10-CM | POA: Diagnosis not present

## 2021-02-08 DIAGNOSIS — N186 End stage renal disease: Secondary | ICD-10-CM | POA: Diagnosis not present

## 2021-02-08 DIAGNOSIS — Z992 Dependence on renal dialysis: Secondary | ICD-10-CM | POA: Diagnosis not present

## 2021-02-10 DIAGNOSIS — N2581 Secondary hyperparathyroidism of renal origin: Secondary | ICD-10-CM | POA: Diagnosis not present

## 2021-02-10 DIAGNOSIS — Z992 Dependence on renal dialysis: Secondary | ICD-10-CM | POA: Diagnosis not present

## 2021-02-10 DIAGNOSIS — N186 End stage renal disease: Secondary | ICD-10-CM | POA: Diagnosis not present

## 2021-02-12 DIAGNOSIS — N2581 Secondary hyperparathyroidism of renal origin: Secondary | ICD-10-CM | POA: Diagnosis not present

## 2021-02-12 DIAGNOSIS — Z992 Dependence on renal dialysis: Secondary | ICD-10-CM | POA: Diagnosis not present

## 2021-02-12 DIAGNOSIS — N186 End stage renal disease: Secondary | ICD-10-CM | POA: Diagnosis not present

## 2021-02-15 DIAGNOSIS — N186 End stage renal disease: Secondary | ICD-10-CM | POA: Diagnosis not present

## 2021-02-15 DIAGNOSIS — N2581 Secondary hyperparathyroidism of renal origin: Secondary | ICD-10-CM | POA: Diagnosis not present

## 2021-02-15 DIAGNOSIS — Z992 Dependence on renal dialysis: Secondary | ICD-10-CM | POA: Diagnosis not present

## 2021-02-19 DIAGNOSIS — N186 End stage renal disease: Secondary | ICD-10-CM | POA: Diagnosis not present

## 2021-02-19 DIAGNOSIS — N2581 Secondary hyperparathyroidism of renal origin: Secondary | ICD-10-CM | POA: Diagnosis not present

## 2021-02-19 DIAGNOSIS — Z992 Dependence on renal dialysis: Secondary | ICD-10-CM | POA: Diagnosis not present

## 2021-02-22 DIAGNOSIS — N186 End stage renal disease: Secondary | ICD-10-CM | POA: Diagnosis not present

## 2021-02-22 DIAGNOSIS — Z992 Dependence on renal dialysis: Secondary | ICD-10-CM | POA: Diagnosis not present

## 2021-02-22 DIAGNOSIS — N2581 Secondary hyperparathyroidism of renal origin: Secondary | ICD-10-CM | POA: Diagnosis not present

## 2021-02-24 DIAGNOSIS — N2581 Secondary hyperparathyroidism of renal origin: Secondary | ICD-10-CM | POA: Diagnosis not present

## 2021-02-24 DIAGNOSIS — Z992 Dependence on renal dialysis: Secondary | ICD-10-CM | POA: Diagnosis not present

## 2021-02-24 DIAGNOSIS — N186 End stage renal disease: Secondary | ICD-10-CM | POA: Diagnosis not present

## 2021-02-26 DIAGNOSIS — Z992 Dependence on renal dialysis: Secondary | ICD-10-CM | POA: Diagnosis not present

## 2021-02-26 DIAGNOSIS — N186 End stage renal disease: Secondary | ICD-10-CM | POA: Diagnosis not present

## 2021-02-26 DIAGNOSIS — N2581 Secondary hyperparathyroidism of renal origin: Secondary | ICD-10-CM | POA: Diagnosis not present

## 2021-02-28 DIAGNOSIS — N184 Chronic kidney disease, stage 4 (severe): Secondary | ICD-10-CM | POA: Insufficient documentation

## 2021-03-01 ENCOUNTER — Telehealth: Payer: Self-pay

## 2021-03-01 DIAGNOSIS — N186 End stage renal disease: Secondary | ICD-10-CM | POA: Diagnosis not present

## 2021-03-01 DIAGNOSIS — N2581 Secondary hyperparathyroidism of renal origin: Secondary | ICD-10-CM | POA: Diagnosis not present

## 2021-03-01 DIAGNOSIS — Z992 Dependence on renal dialysis: Secondary | ICD-10-CM | POA: Diagnosis not present

## 2021-03-01 DIAGNOSIS — E1129 Type 2 diabetes mellitus with other diabetic kidney complication: Secondary | ICD-10-CM | POA: Diagnosis not present

## 2021-03-01 NOTE — Telephone Encounter (Signed)
[name] won't be able to make it to her husbands appointment tomorrow and would like to know if she can get a call after the appointment with a summary of his appoinment/results etc.. CB # [phone]

## 2021-03-02 ENCOUNTER — Other Ambulatory Visit: Payer: Self-pay

## 2021-03-02 ENCOUNTER — Ambulatory Visit: Payer: Medicare HMO | Admitting: Cardiology

## 2021-03-02 ENCOUNTER — Encounter: Payer: Self-pay | Admitting: Cardiology

## 2021-03-02 ENCOUNTER — Ambulatory Visit (INDEPENDENT_AMBULATORY_CARE_PROVIDER_SITE_OTHER): Payer: Medicare HMO

## 2021-03-02 VITALS — BP 160/70 | HR 64 | Ht 69.0 in | Wt 184.0 lb

## 2021-03-02 DIAGNOSIS — I25118 Atherosclerotic heart disease of native coronary artery with other forms of angina pectoris: Secondary | ICD-10-CM

## 2021-03-02 DIAGNOSIS — N185 Chronic kidney disease, stage 5: Secondary | ICD-10-CM

## 2021-03-02 DIAGNOSIS — I452 Bifascicular block: Secondary | ICD-10-CM

## 2021-03-02 DIAGNOSIS — R001 Bradycardia, unspecified: Secondary | ICD-10-CM

## 2021-03-02 DIAGNOSIS — I132 Hypertensive heart and chronic kidney disease with heart failure and with stage 5 chronic kidney disease, or end stage renal disease: Secondary | ICD-10-CM | POA: Diagnosis not present

## 2021-03-02 DIAGNOSIS — I350 Nonrheumatic aortic (valve) stenosis: Secondary | ICD-10-CM

## 2021-03-02 NOTE — Addendum Note (Signed)
Addended by: Claude Manges on: 03/02/2021 04:26 PM ? ? Modules accepted: Orders ? ?

## 2021-03-02 NOTE — Patient Instructions (Addendum)
Medication Instructions:  ? ? ?STAR TAKING ASPIRIN  81 MG ONCE A DAY  ?  ?*If you need a refill on your cardiac medications before your next appointment, please call your pharmacy* ? ? ?Lab Work: NONE ORDERED  TODAY ? ? ?If you have labs (blood work) drawn today and your tests are completely normal, you will receive your results only by: ?MyChart Message (if you have MyChart) OR ?A paper copy in the mail ?If you have any lab test that is abnormal or we need to change your treatment, we will call you to review the results. ? ? ?Testing/Procedures:  Your physician has recommended that you wear an event monitor. Event monitors are medical devices that record the heart?s electrical activity. Doctors most often [place] these monitors to diagnose arrhythmias. Arrhythmias are problems with the speed or rhythm of the heartbeat. The monitor is a small, portable device. You can wear one while you do your normal daily activities. This is usually used to diagnose what is causing palpitations/syncope (passing out). ? ? ? ?Follow-Up: ?At [facility], you and your health needs are our priority.  As part of our continuing mission to provide you with exceptional heart care, we have created designated Provider Care Teams.  These Care Teams include your primary Cardiologist (physician) and Advanced Practice Providers (APPs -  Physician Assistants and Nurse Practitioners) who all work together to provide you with the care you need, when you need it. ? ?We recommend signing up for the patient portal called "MyChart".  Sign up information is provided on this After Visit Summary.  MyChart is used to connect with patients for Virtual Visits (Telemedicine).  Patients are able to view lab/test results, encounter notes, upcoming appointments, etc.  Non-urgent messages can be sent to your provider as well.   ?To learn more about what you can do with MyChart, go to [email or web address].   ? ?Your next appointment:   ?6 month(s) ? ?The format  for your next appointment:   ?In Person ? ?Provider:   ?Shirlee More, MD  ? ? ?Other Instructions ? ?

## 2021-03-02 NOTE — Progress Notes (Signed)
Cardiology Office Note:    Date:  03/02/2021   ID:  Justin [name], DOB 1950/01/22, MRN [id number]  PCP:  Reynold Bowen, MD  Cardiologist:  Shirlee More, MD    Referring MD: Reynold Bowen, MD    ASSESSMENT:    1. Hypertensive heart disease with congestive heart failure and stage 5 kidney disease ([place])   2. Coronary artery disease of native artery of native heart with stable angina pectoris ([place])   3. Nonrheumatic aortic valve stenosis   4. RBBB (right bundle branch block with left anterior fascicular block)    PLAN:    In order of problems listed above:  Overall he is done well with hemodialysis blood pressures controlled heart failure is compensated with ultrafiltration and will continue current treatment including his oral nitrate hydralazine and beta-blocker. Stable CAD having no anginal discomfort continue medical therapy start aspirin oral nitrate beta-blocker and statin Stable consider repeat echocardiogram about 1 year With this fall subdural hematoma bifascicular heart block we will apply a ZIO monitor for 1 week to screen for significant bradycardia or intermittent heart block   Next appointment: 6 months   Medication Adjustments/Labs and Tests Ordered: Current medicines are reviewed at length with the patient today.  Concerns regarding medicines are outlined above.  No orders of the defined types were placed in this encounter.  No orders of the defined types were placed in this encounter.   Chief Complaint  Patient presents with   Follow-up   Congestive Heart Failure   Hypertension   Coronary Artery Disease   Aortic Stenosis    History of Present Illness:    Justin [name] is a 71 y.o. male with a hx of hypertensive heart and kidney disease with chronic diastolic heart failure stage V CKD renal replacement therapy hemodialysis CAD hyperlipidemia and history of subdural hematoma last seen 10/27/2020.  He was Hospitalized April 2020 with hypertensive urgency and  congestive heart failure. Echo 04/2018 with LVEF 35-40%, moderate LV enlargement, moderate LA enlargement, small pericardial effusion, moderate aortic stenosis. LHC with 50-60% mid LAD, apical 90% LAD, 75% first diagonal, 75% Cx, occluded first marginal, 50% RCA, 95% PDA. RHC with mild aortic stenosis. He was recommended for medical therapy.  He also has bifascicular heart block   He was seen by cardiology at [facility] for evaluation of minimally positive nuclear stress test (mild inducible ischemia of apical inferolateral wall) as part of pre transplant evaluation. LHC 04/28/20 with LAD showing mild plaque (25% proximal, 20% distal), D1 20% proximal plaque, ramus 10% prox plaque, RCA 45% mid, 75% prox focal stenosis in long PDA, small right PL 60-70% plaque. Recommended for medical therapy   He was seen in the ED 07/12/20 with subacute on chronic R SDH after fall 1 week prior. He went to the OR 07/13/20 for R burr hold for SDH evaluation. He received 1u PRBC for Hb <7.   Most recent echocardiogram 10/18/2020 shows moderate concentric LVH normal left ventricular size low normal ejection fraction 50 to 55% with elevated left atrial pressure.  Right ventricular systolic function was mildly reduced with normal pulmonary artery pressure small pericardial effusion is present there is mild mitral regurgitation mild aortic valve stenosis with peak and mean gradients of 28 and 16 mmHg mild enlargement of the ascending aorta.   Compliance with diet, lifestyle and medications: Yes  Overall he is doing well with his CAD he is not having anginal discomfort.  He is not taking aspirin I asked him to  start 81 mg daily.   He has had no further falls or syncope Home blood pressure runs in the range of 130/80 although he is hypotensive after dialysis and requires a period of time to recover before he goes home He tolerates his statin without muscle pain or weakness  Past Medical History:  Diagnosis Date   BPH (benign  prostatic hyperplasia)    Chronic kidney disease (CKD), stage IV (severe) (HCC)    Diabetes (HCC)    Dysuria    Erectile dysfunction    Hyperlipemia    Hypertension    Pinna disorder, left    Renal disorder    Wears glasses     Past Surgical History:  Procedure Laterality Date   AV FISTULA PLACEMENT Left 04/30/2018   Procedure: CREATION OF RADIOCEPHALIC ARTERIOVENOUS FISTULA LEFT ARM;  Surgeon: Elam Dutch, MD;  Location: [facility] OR;  Service: Vascular;  Laterality: Left;   AV FISTULA PLACEMENT Left 08/28/2018   Procedure: ARTERIOVENOUS (AV) BRACHIOCEPHALIC FISTULA CREATION LEFT UPPER ARM;  Surgeon: Marty Heck, MD;  Location: [facility] OR;  Service: Vascular;  Laterality: Left;   IR FLUORO GUIDE CV LINE RIGHT  04/29/2018   IR US GUIDE VASC ACCESS RIGHT  04/29/2018   RIGHT/LEFT HEART CATH AND CORONARY ANGIOGRAPHY N/A 05/01/2018   Procedure: RIGHT/LEFT HEART CATH AND CORONARY ANGIOGRAPHY;  Surgeon: Belva Crome, MD;  Location: [place] [facility] LAB;  Service: Cardiovascular;  Laterality: N/A;   subdural hematoma Right 07/2020    Current Medications: Current Meds  Medication Sig   calcium acetate (PHOSLO) 667 MG capsule Take 2,001 mg by mouth 3 (three) times daily.   hydrALAZINE (APRESOLINE) 25 MG tablet Take 50 mg by mouth 3 (three) times daily. On Tues, Thurs, Saturday   isosorbide mononitrate (IMDUR) 30 MG 24 hr tablet TAKE 1 TABLET BY MOUTH IN THE MORNING ON NON-DIALYSIS DAYS   metoprolol succinate (TOPROL-XL) 50 MG 24 hr tablet Take 50 mg by mouth daily. Take 2 tablets 2xs a day mon,wed,fri,sun   nitroGLYCERIN (NITROSTAT) 0.4 MG SL tablet Place 1 tablet (0.4 mg total) under the tongue every 5 (five) minutes as needed.   NOVOLIN 70/30 RELION (70-30) 100 UNIT/ML injection Inject 20 Units into the skin daily. (Patient taking differently: Inject 20 Units into the skin See admin instructions. Inject 20 units subcutaneously with breakfast on Sunday, Monday, Wednesday, Friday; inject  20 units with lunch on Tuesday, Thursday, Saturday (dialysis days))   rosuvastatin (CRESTOR) 10 MG tablet Take 10 mg by mouth daily.     Allergies:   Patient has no known allergies.   Social History   Socioeconomic History   Marital status: Divorced    Spouse name: Not on file   Number of children: 1   Years of education: Not on file   Highest education level: Not on file  Occupational History   Not on file  Tobacco Use   Smoking status: Never   Smokeless tobacco: Never  Vaping Use   Vaping Use: Never used  Substance and Sexual Activity   Alcohol use: Not Currently   Drug use: Never   Sexual activity: Not Currently  Other Topics Concern   Not on file  Social History Narrative   Not on file   Social Determinants of Health   Financial Resource Strain: Not on file  Food Insecurity: Not on file  Transportation Needs: Not on file  Physical Activity: Not on file  Stress: Not on file  Social Connections: Not on file  Family History: The patient's family history includes Diabetes in his father; Stroke in his mother. ROS:   Please see the history of present illness.    All other systems reviewed and are negative.  EKGs/Labs/Other Studies Reviewed:    The following studies were reviewed today:   Recent Labs: 08/24/2020: Hemoglobin 10.1; Platelets 158.0  Recent Lipid Panel    Component Value Date/Time   CHOL 109 04/25/2018 0429   TRIG 73 04/25/2018 0429   HDL 34 (L) 04/25/2018 0429   CHOLHDL 3.2 04/25/2018 0429   VLDL 15 04/25/2018 0429   LDLCALC 60 04/25/2018 0429   LDLDIRECT 32 06/05/2019 1639    Physical Exam:    VS:  BP (!) 160/70 (BP Location: Right Arm, Patient Position: Sitting, Cuff Size: Normal)    Pulse 64    Ht 5\' 9"  (1.753 m)    Wt 184 lb (83.5 kg)    SpO2 97%    BMI 27.17 kg/m     Wt Readings from Last 3 Encounters:  03/02/21 184 lb (83.5 kg)  10/27/20 173 lb (78.5 kg)  08/24/20 186 lb 8 oz (84.6 kg)     GEN:  Well nourished, well  developed in no acute distress HEENT: Normal NECK: No JVD; No carotid bruits LYMPHATICS: No lymphadenopathy CARDIAC: 1/6 systolic ejection murmur aortic area does not encompass S2 does not radiate to the carotids and no aortic regurgitation RRR, no rubs, gallops RESPIRATORY:  Clear to auscultation without rales, wheezing or rhonchi  ABDOMEN: Soft, non-tender, non-distended MUSCULOSKELETAL:  No edema; No deformity  SKIN: Warm and dry NEUROLOGIC:  Alert and oriented x 3 PSYCHIATRIC:  Normal affect    Signed, Shirlee More, MD  03/02/2021 3:54 PM    [facility]

## 2021-03-03 DIAGNOSIS — N186 End stage renal disease: Secondary | ICD-10-CM | POA: Diagnosis not present

## 2021-03-03 DIAGNOSIS — N2581 Secondary hyperparathyroidism of renal origin: Secondary | ICD-10-CM | POA: Diagnosis not present

## 2021-03-03 DIAGNOSIS — Z992 Dependence on renal dialysis: Secondary | ICD-10-CM | POA: Diagnosis not present

## 2021-03-05 ENCOUNTER — Other Ambulatory Visit (HOSPITAL_BASED_OUTPATIENT_CLINIC_OR_DEPARTMENT_OTHER): Payer: Self-pay | Admitting: Cardiology

## 2021-03-05 DIAGNOSIS — N186 End stage renal disease: Secondary | ICD-10-CM | POA: Diagnosis not present

## 2021-03-05 DIAGNOSIS — I25118 Atherosclerotic heart disease of native coronary artery with other forms of angina pectoris: Secondary | ICD-10-CM

## 2021-03-05 DIAGNOSIS — N2581 Secondary hyperparathyroidism of renal origin: Secondary | ICD-10-CM | POA: Diagnosis not present

## 2021-03-05 DIAGNOSIS — Z992 Dependence on renal dialysis: Secondary | ICD-10-CM | POA: Diagnosis not present

## 2021-03-05 DIAGNOSIS — I10 Essential (primary) hypertension: Secondary | ICD-10-CM

## 2021-03-05 DIAGNOSIS — I350 Nonrheumatic aortic (valve) stenosis: Secondary | ICD-10-CM

## 2021-03-08 DIAGNOSIS — N2581 Secondary hyperparathyroidism of renal origin: Secondary | ICD-10-CM | POA: Diagnosis not present

## 2021-03-08 DIAGNOSIS — N186 End stage renal disease: Secondary | ICD-10-CM | POA: Diagnosis not present

## 2021-03-08 DIAGNOSIS — Z992 Dependence on renal dialysis: Secondary | ICD-10-CM | POA: Diagnosis not present

## 2021-03-10 DIAGNOSIS — N2581 Secondary hyperparathyroidism of renal origin: Secondary | ICD-10-CM | POA: Diagnosis not present

## 2021-03-10 DIAGNOSIS — Z992 Dependence on renal dialysis: Secondary | ICD-10-CM | POA: Diagnosis not present

## 2021-03-10 DIAGNOSIS — N186 End stage renal disease: Secondary | ICD-10-CM | POA: Diagnosis not present

## 2021-03-10 NOTE — Telephone Encounter (Signed)
Spoke with pt's wife about pt's last visit with DR. Munley. Wife states that he is weaker and weaker and now is in bed after his treatments, they are hitting him harder than before. She is asking about heart transplant, is this still a viable option? ?

## 2021-03-17 DIAGNOSIS — N186 End stage renal disease: Secondary | ICD-10-CM | POA: Diagnosis not present

## 2021-03-17 DIAGNOSIS — Z992 Dependence on renal dialysis: Secondary | ICD-10-CM | POA: Diagnosis not present

## 2021-03-17 DIAGNOSIS — N2581 Secondary hyperparathyroidism of renal origin: Secondary | ICD-10-CM | POA: Diagnosis not present

## 2021-03-18 DIAGNOSIS — R001 Bradycardia, unspecified: Secondary | ICD-10-CM | POA: Diagnosis not present

## 2021-03-19 DIAGNOSIS — N186 End stage renal disease: Secondary | ICD-10-CM | POA: Diagnosis not present

## 2021-03-19 DIAGNOSIS — N2581 Secondary hyperparathyroidism of renal origin: Secondary | ICD-10-CM | POA: Diagnosis not present

## 2021-03-19 DIAGNOSIS — Z992 Dependence on renal dialysis: Secondary | ICD-10-CM | POA: Diagnosis not present

## 2021-03-22 DIAGNOSIS — Z992 Dependence on renal dialysis: Secondary | ICD-10-CM | POA: Diagnosis not present

## 2021-03-22 DIAGNOSIS — N2581 Secondary hyperparathyroidism of renal origin: Secondary | ICD-10-CM | POA: Diagnosis not present

## 2021-03-22 DIAGNOSIS — N186 End stage renal disease: Secondary | ICD-10-CM | POA: Diagnosis not present

## 2021-03-24 DIAGNOSIS — Z992 Dependence on renal dialysis: Secondary | ICD-10-CM | POA: Diagnosis not present

## 2021-03-24 DIAGNOSIS — N2581 Secondary hyperparathyroidism of renal origin: Secondary | ICD-10-CM | POA: Diagnosis not present

## 2021-03-24 DIAGNOSIS — N186 End stage renal disease: Secondary | ICD-10-CM | POA: Diagnosis not present

## 2021-03-26 DIAGNOSIS — Z992 Dependence on renal dialysis: Secondary | ICD-10-CM | POA: Diagnosis not present

## 2021-03-26 DIAGNOSIS — N186 End stage renal disease: Secondary | ICD-10-CM | POA: Diagnosis not present

## 2021-03-26 DIAGNOSIS — N2581 Secondary hyperparathyroidism of renal origin: Secondary | ICD-10-CM | POA: Diagnosis not present

## 2021-03-29 ENCOUNTER — Telehealth: Payer: Self-pay | Admitting: *Deleted

## 2021-03-29 DIAGNOSIS — N2581 Secondary hyperparathyroidism of renal origin: Secondary | ICD-10-CM | POA: Diagnosis not present

## 2021-03-29 DIAGNOSIS — Z992 Dependence on renal dialysis: Secondary | ICD-10-CM | POA: Diagnosis not present

## 2021-03-29 DIAGNOSIS — N186 End stage renal disease: Secondary | ICD-10-CM | POA: Diagnosis not present

## 2021-03-29 NOTE — Telephone Encounter (Signed)
Spoke with Tye [facility]) and let her know the monitor results were normal, no indication for need of pacemaker. She asked what the next step is to get pt approved for kidney transplant. Let her know that she would need to contact the surgeon and have them fax [place] a surgical clearance form to go through the process. Will send the fax number through [place]. ?

## 2021-03-29 NOTE — Telephone Encounter (Signed)
-----   Message from Richardo Priest, MD sent at 03/26/2021 10:15 AM EDT ----- ?Good result ? ?No severe problems with his heart rhythm no indication he needs a pacemaker. ?

## 2021-03-31 DIAGNOSIS — Z992 Dependence on renal dialysis: Secondary | ICD-10-CM | POA: Diagnosis not present

## 2021-03-31 DIAGNOSIS — N186 End stage renal disease: Secondary | ICD-10-CM | POA: Diagnosis not present

## 2021-03-31 DIAGNOSIS — N2581 Secondary hyperparathyroidism of renal origin: Secondary | ICD-10-CM | POA: Diagnosis not present

## 2021-04-01 DIAGNOSIS — I2510 Atherosclerotic heart disease of native coronary artery without angina pectoris: Secondary | ICD-10-CM | POA: Diagnosis not present

## 2021-04-01 DIAGNOSIS — E785 Hyperlipidemia, unspecified: Secondary | ICD-10-CM | POA: Diagnosis not present

## 2021-04-01 DIAGNOSIS — I10 Essential (primary) hypertension: Secondary | ICD-10-CM | POA: Diagnosis not present

## 2021-04-01 DIAGNOSIS — E1129 Type 2 diabetes mellitus with other diabetic kidney complication: Secondary | ICD-10-CM | POA: Diagnosis not present

## 2021-04-02 DIAGNOSIS — Z992 Dependence on renal dialysis: Secondary | ICD-10-CM | POA: Diagnosis not present

## 2021-04-02 DIAGNOSIS — N2581 Secondary hyperparathyroidism of renal origin: Secondary | ICD-10-CM | POA: Diagnosis not present

## 2021-04-02 DIAGNOSIS — N186 End stage renal disease: Secondary | ICD-10-CM | POA: Diagnosis not present

## 2021-04-05 DIAGNOSIS — N186 End stage renal disease: Secondary | ICD-10-CM | POA: Diagnosis not present

## 2021-04-05 DIAGNOSIS — Z992 Dependence on renal dialysis: Secondary | ICD-10-CM | POA: Diagnosis not present

## 2021-04-05 DIAGNOSIS — N2581 Secondary hyperparathyroidism of renal origin: Secondary | ICD-10-CM | POA: Diagnosis not present

## 2021-04-07 DIAGNOSIS — N2581 Secondary hyperparathyroidism of renal origin: Secondary | ICD-10-CM | POA: Diagnosis not present

## 2021-04-07 DIAGNOSIS — Z992 Dependence on renal dialysis: Secondary | ICD-10-CM | POA: Diagnosis not present

## 2021-04-07 DIAGNOSIS — N186 End stage renal disease: Secondary | ICD-10-CM | POA: Diagnosis not present

## 2021-04-09 DIAGNOSIS — N186 End stage renal disease: Secondary | ICD-10-CM | POA: Diagnosis not present

## 2021-04-09 DIAGNOSIS — N2581 Secondary hyperparathyroidism of renal origin: Secondary | ICD-10-CM | POA: Diagnosis not present

## 2021-04-09 DIAGNOSIS — Z992 Dependence on renal dialysis: Secondary | ICD-10-CM | POA: Diagnosis not present

## 2021-04-12 DIAGNOSIS — N186 End stage renal disease: Secondary | ICD-10-CM | POA: Diagnosis not present

## 2021-04-12 DIAGNOSIS — N2581 Secondary hyperparathyroidism of renal origin: Secondary | ICD-10-CM | POA: Diagnosis not present

## 2021-04-12 DIAGNOSIS — Z992 Dependence on renal dialysis: Secondary | ICD-10-CM | POA: Diagnosis not present

## 2021-04-14 DIAGNOSIS — N186 End stage renal disease: Secondary | ICD-10-CM | POA: Diagnosis not present

## 2021-04-14 DIAGNOSIS — Z992 Dependence on renal dialysis: Secondary | ICD-10-CM | POA: Diagnosis not present

## 2021-04-14 DIAGNOSIS — N2581 Secondary hyperparathyroidism of renal origin: Secondary | ICD-10-CM | POA: Diagnosis not present

## 2021-04-16 ENCOUNTER — Encounter: Payer: Self-pay | Admitting: Cardiology

## 2021-04-16 DIAGNOSIS — N186 End stage renal disease: Secondary | ICD-10-CM | POA: Diagnosis not present

## 2021-04-16 DIAGNOSIS — N2581 Secondary hyperparathyroidism of renal origin: Secondary | ICD-10-CM | POA: Diagnosis not present

## 2021-04-16 DIAGNOSIS — Z992 Dependence on renal dialysis: Secondary | ICD-10-CM | POA: Diagnosis not present

## 2021-04-19 DIAGNOSIS — N186 End stage renal disease: Secondary | ICD-10-CM | POA: Diagnosis not present

## 2021-04-19 DIAGNOSIS — Z992 Dependence on renal dialysis: Secondary | ICD-10-CM | POA: Diagnosis not present

## 2021-04-19 DIAGNOSIS — N2581 Secondary hyperparathyroidism of renal origin: Secondary | ICD-10-CM | POA: Diagnosis not present

## 2021-04-21 DIAGNOSIS — N186 End stage renal disease: Secondary | ICD-10-CM | POA: Diagnosis not present

## 2021-04-21 DIAGNOSIS — Z992 Dependence on renal dialysis: Secondary | ICD-10-CM | POA: Diagnosis not present

## 2021-04-21 DIAGNOSIS — N2581 Secondary hyperparathyroidism of renal origin: Secondary | ICD-10-CM | POA: Diagnosis not present

## 2021-04-23 DIAGNOSIS — N186 End stage renal disease: Secondary | ICD-10-CM | POA: Diagnosis not present

## 2021-04-23 DIAGNOSIS — N2581 Secondary hyperparathyroidism of renal origin: Secondary | ICD-10-CM | POA: Diagnosis not present

## 2021-04-23 DIAGNOSIS — Z992 Dependence on renal dialysis: Secondary | ICD-10-CM | POA: Diagnosis not present

## 2021-04-26 DIAGNOSIS — N2581 Secondary hyperparathyroidism of renal origin: Secondary | ICD-10-CM | POA: Diagnosis not present

## 2021-04-26 DIAGNOSIS — N186 End stage renal disease: Secondary | ICD-10-CM | POA: Diagnosis not present

## 2021-04-26 DIAGNOSIS — Z992 Dependence on renal dialysis: Secondary | ICD-10-CM | POA: Diagnosis not present

## 2021-04-28 DIAGNOSIS — Z992 Dependence on renal dialysis: Secondary | ICD-10-CM | POA: Diagnosis not present

## 2021-04-28 DIAGNOSIS — N2581 Secondary hyperparathyroidism of renal origin: Secondary | ICD-10-CM | POA: Diagnosis not present

## 2021-04-28 DIAGNOSIS — N186 End stage renal disease: Secondary | ICD-10-CM | POA: Diagnosis not present

## 2021-04-30 DIAGNOSIS — Z992 Dependence on renal dialysis: Secondary | ICD-10-CM | POA: Diagnosis not present

## 2021-04-30 DIAGNOSIS — N186 End stage renal disease: Secondary | ICD-10-CM | POA: Diagnosis not present

## 2021-04-30 DIAGNOSIS — N2581 Secondary hyperparathyroidism of renal origin: Secondary | ICD-10-CM | POA: Diagnosis not present

## 2021-05-01 DIAGNOSIS — E785 Hyperlipidemia, unspecified: Secondary | ICD-10-CM | POA: Diagnosis not present

## 2021-05-01 DIAGNOSIS — I10 Essential (primary) hypertension: Secondary | ICD-10-CM | POA: Diagnosis not present

## 2021-05-01 DIAGNOSIS — I2510 Atherosclerotic heart disease of native coronary artery without angina pectoris: Secondary | ICD-10-CM | POA: Diagnosis not present

## 2021-05-01 DIAGNOSIS — E1129 Type 2 diabetes mellitus with other diabetic kidney complication: Secondary | ICD-10-CM | POA: Diagnosis not present

## 2021-05-01 DIAGNOSIS — N186 End stage renal disease: Secondary | ICD-10-CM | POA: Diagnosis not present

## 2021-05-01 DIAGNOSIS — Z992 Dependence on renal dialysis: Secondary | ICD-10-CM | POA: Diagnosis not present

## 2021-05-03 DIAGNOSIS — N186 End stage renal disease: Secondary | ICD-10-CM | POA: Diagnosis not present

## 2021-05-03 DIAGNOSIS — N2581 Secondary hyperparathyroidism of renal origin: Secondary | ICD-10-CM | POA: Diagnosis not present

## 2021-05-03 DIAGNOSIS — Z992 Dependence on renal dialysis: Secondary | ICD-10-CM | POA: Diagnosis not present

## 2021-05-05 DIAGNOSIS — Z992 Dependence on renal dialysis: Secondary | ICD-10-CM | POA: Diagnosis not present

## 2021-05-05 DIAGNOSIS — N2581 Secondary hyperparathyroidism of renal origin: Secondary | ICD-10-CM | POA: Diagnosis not present

## 2021-05-05 DIAGNOSIS — N186 End stage renal disease: Secondary | ICD-10-CM | POA: Diagnosis not present

## 2021-05-07 DIAGNOSIS — N186 End stage renal disease: Secondary | ICD-10-CM | POA: Diagnosis not present

## 2021-05-07 DIAGNOSIS — N2581 Secondary hyperparathyroidism of renal origin: Secondary | ICD-10-CM | POA: Diagnosis not present

## 2021-05-07 DIAGNOSIS — Z992 Dependence on renal dialysis: Secondary | ICD-10-CM | POA: Diagnosis not present

## 2021-05-10 DIAGNOSIS — N186 End stage renal disease: Secondary | ICD-10-CM | POA: Diagnosis not present

## 2021-05-10 DIAGNOSIS — Z992 Dependence on renal dialysis: Secondary | ICD-10-CM | POA: Diagnosis not present

## 2021-05-10 DIAGNOSIS — N2581 Secondary hyperparathyroidism of renal origin: Secondary | ICD-10-CM | POA: Diagnosis not present

## 2021-05-12 DIAGNOSIS — N2581 Secondary hyperparathyroidism of renal origin: Secondary | ICD-10-CM | POA: Diagnosis not present

## 2021-05-12 DIAGNOSIS — Z992 Dependence on renal dialysis: Secondary | ICD-10-CM | POA: Diagnosis not present

## 2021-05-12 DIAGNOSIS — N186 End stage renal disease: Secondary | ICD-10-CM | POA: Diagnosis not present

## 2021-05-14 DIAGNOSIS — N186 End stage renal disease: Secondary | ICD-10-CM | POA: Diagnosis not present

## 2021-05-14 DIAGNOSIS — N2581 Secondary hyperparathyroidism of renal origin: Secondary | ICD-10-CM | POA: Diagnosis not present

## 2021-05-14 DIAGNOSIS — Z992 Dependence on renal dialysis: Secondary | ICD-10-CM | POA: Diagnosis not present

## 2021-05-17 DIAGNOSIS — N2581 Secondary hyperparathyroidism of renal origin: Secondary | ICD-10-CM | POA: Diagnosis not present

## 2021-05-17 DIAGNOSIS — Z992 Dependence on renal dialysis: Secondary | ICD-10-CM | POA: Diagnosis not present

## 2021-05-17 DIAGNOSIS — N186 End stage renal disease: Secondary | ICD-10-CM | POA: Diagnosis not present

## 2021-05-19 DIAGNOSIS — N2581 Secondary hyperparathyroidism of renal origin: Secondary | ICD-10-CM | POA: Diagnosis not present

## 2021-05-19 DIAGNOSIS — Z992 Dependence on renal dialysis: Secondary | ICD-10-CM | POA: Diagnosis not present

## 2021-05-19 DIAGNOSIS — N186 End stage renal disease: Secondary | ICD-10-CM | POA: Diagnosis not present

## 2021-05-21 DIAGNOSIS — N2581 Secondary hyperparathyroidism of renal origin: Secondary | ICD-10-CM | POA: Diagnosis not present

## 2021-05-21 DIAGNOSIS — Z992 Dependence on renal dialysis: Secondary | ICD-10-CM | POA: Diagnosis not present

## 2021-05-21 DIAGNOSIS — N186 End stage renal disease: Secondary | ICD-10-CM | POA: Diagnosis not present

## 2021-05-24 DIAGNOSIS — Z992 Dependence on renal dialysis: Secondary | ICD-10-CM | POA: Diagnosis not present

## 2021-05-24 DIAGNOSIS — N186 End stage renal disease: Secondary | ICD-10-CM | POA: Diagnosis not present

## 2021-05-24 DIAGNOSIS — N2581 Secondary hyperparathyroidism of renal origin: Secondary | ICD-10-CM | POA: Diagnosis not present

## 2021-05-26 DIAGNOSIS — N186 End stage renal disease: Secondary | ICD-10-CM | POA: Diagnosis not present

## 2021-05-26 DIAGNOSIS — Z992 Dependence on renal dialysis: Secondary | ICD-10-CM | POA: Diagnosis not present

## 2021-05-26 DIAGNOSIS — N2581 Secondary hyperparathyroidism of renal origin: Secondary | ICD-10-CM | POA: Diagnosis not present

## 2021-05-28 DIAGNOSIS — N2581 Secondary hyperparathyroidism of renal origin: Secondary | ICD-10-CM | POA: Diagnosis not present

## 2021-05-28 DIAGNOSIS — N186 End stage renal disease: Secondary | ICD-10-CM | POA: Diagnosis not present

## 2021-05-28 DIAGNOSIS — Z992 Dependence on renal dialysis: Secondary | ICD-10-CM | POA: Diagnosis not present

## 2021-05-31 DIAGNOSIS — N2581 Secondary hyperparathyroidism of renal origin: Secondary | ICD-10-CM | POA: Diagnosis not present

## 2021-05-31 DIAGNOSIS — Z992 Dependence on renal dialysis: Secondary | ICD-10-CM | POA: Diagnosis not present

## 2021-05-31 DIAGNOSIS — N186 End stage renal disease: Secondary | ICD-10-CM | POA: Diagnosis not present

## 2021-06-01 DIAGNOSIS — N186 End stage renal disease: Secondary | ICD-10-CM | POA: Diagnosis not present

## 2021-06-01 DIAGNOSIS — E1129 Type 2 diabetes mellitus with other diabetic kidney complication: Secondary | ICD-10-CM | POA: Diagnosis not present

## 2021-06-01 DIAGNOSIS — Z992 Dependence on renal dialysis: Secondary | ICD-10-CM | POA: Diagnosis not present

## 2021-06-02 DIAGNOSIS — N186 End stage renal disease: Secondary | ICD-10-CM | POA: Diagnosis not present

## 2021-06-02 DIAGNOSIS — N2581 Secondary hyperparathyroidism of renal origin: Secondary | ICD-10-CM | POA: Diagnosis not present

## 2021-06-02 DIAGNOSIS — Z992 Dependence on renal dialysis: Secondary | ICD-10-CM | POA: Diagnosis not present

## 2021-06-04 DIAGNOSIS — N186 End stage renal disease: Secondary | ICD-10-CM | POA: Diagnosis not present

## 2021-06-04 DIAGNOSIS — N2581 Secondary hyperparathyroidism of renal origin: Secondary | ICD-10-CM | POA: Diagnosis not present

## 2021-06-04 DIAGNOSIS — Z992 Dependence on renal dialysis: Secondary | ICD-10-CM | POA: Diagnosis not present

## 2021-06-07 DIAGNOSIS — N186 End stage renal disease: Secondary | ICD-10-CM | POA: Diagnosis not present

## 2021-06-07 DIAGNOSIS — N2581 Secondary hyperparathyroidism of renal origin: Secondary | ICD-10-CM | POA: Diagnosis not present

## 2021-06-07 DIAGNOSIS — Z992 Dependence on renal dialysis: Secondary | ICD-10-CM | POA: Diagnosis not present

## 2021-06-09 DIAGNOSIS — N2581 Secondary hyperparathyroidism of renal origin: Secondary | ICD-10-CM | POA: Diagnosis not present

## 2021-06-09 DIAGNOSIS — Z992 Dependence on renal dialysis: Secondary | ICD-10-CM | POA: Diagnosis not present

## 2021-06-09 DIAGNOSIS — N186 End stage renal disease: Secondary | ICD-10-CM | POA: Diagnosis not present

## 2021-06-11 DIAGNOSIS — N186 End stage renal disease: Secondary | ICD-10-CM | POA: Diagnosis not present

## 2021-06-11 DIAGNOSIS — Z992 Dependence on renal dialysis: Secondary | ICD-10-CM | POA: Diagnosis not present

## 2021-06-11 DIAGNOSIS — N2581 Secondary hyperparathyroidism of renal origin: Secondary | ICD-10-CM | POA: Diagnosis not present

## 2021-06-14 DIAGNOSIS — N186 End stage renal disease: Secondary | ICD-10-CM | POA: Diagnosis not present

## 2021-06-14 DIAGNOSIS — N2581 Secondary hyperparathyroidism of renal origin: Secondary | ICD-10-CM | POA: Diagnosis not present

## 2021-06-14 DIAGNOSIS — Z992 Dependence on renal dialysis: Secondary | ICD-10-CM | POA: Diagnosis not present

## 2021-06-15 DIAGNOSIS — E785 Hyperlipidemia, unspecified: Secondary | ICD-10-CM | POA: Diagnosis not present

## 2021-06-15 DIAGNOSIS — N186 End stage renal disease: Secondary | ICD-10-CM | POA: Diagnosis not present

## 2021-06-15 DIAGNOSIS — Z794 Long term (current) use of insulin: Secondary | ICD-10-CM | POA: Diagnosis not present

## 2021-06-15 DIAGNOSIS — E1129 Type 2 diabetes mellitus with other diabetic kidney complication: Secondary | ICD-10-CM | POA: Diagnosis not present

## 2021-06-15 DIAGNOSIS — N401 Enlarged prostate with lower urinary tract symptoms: Secondary | ICD-10-CM | POA: Diagnosis not present

## 2021-06-15 DIAGNOSIS — I700 Atherosclerosis of aorta: Secondary | ICD-10-CM | POA: Diagnosis not present

## 2021-06-15 DIAGNOSIS — D631 Anemia in chronic kidney disease: Secondary | ICD-10-CM | POA: Diagnosis not present

## 2021-06-15 DIAGNOSIS — I2510 Atherosclerotic heart disease of native coronary artery without angina pectoris: Secondary | ICD-10-CM | POA: Diagnosis not present

## 2021-06-15 DIAGNOSIS — I10 Essential (primary) hypertension: Secondary | ICD-10-CM | POA: Diagnosis not present

## 2021-06-16 DIAGNOSIS — N186 End stage renal disease: Secondary | ICD-10-CM | POA: Diagnosis not present

## 2021-06-16 DIAGNOSIS — N2581 Secondary hyperparathyroidism of renal origin: Secondary | ICD-10-CM | POA: Diagnosis not present

## 2021-06-16 DIAGNOSIS — Z992 Dependence on renal dialysis: Secondary | ICD-10-CM | POA: Diagnosis not present

## 2021-06-18 DIAGNOSIS — N2581 Secondary hyperparathyroidism of renal origin: Secondary | ICD-10-CM | POA: Diagnosis not present

## 2021-06-18 DIAGNOSIS — N186 End stage renal disease: Secondary | ICD-10-CM | POA: Diagnosis not present

## 2021-06-18 DIAGNOSIS — Z992 Dependence on renal dialysis: Secondary | ICD-10-CM | POA: Diagnosis not present

## 2021-06-21 DIAGNOSIS — N2581 Secondary hyperparathyroidism of renal origin: Secondary | ICD-10-CM | POA: Diagnosis not present

## 2021-06-21 DIAGNOSIS — N186 End stage renal disease: Secondary | ICD-10-CM | POA: Diagnosis not present

## 2021-06-21 DIAGNOSIS — Z992 Dependence on renal dialysis: Secondary | ICD-10-CM | POA: Diagnosis not present

## 2021-06-23 DIAGNOSIS — N2581 Secondary hyperparathyroidism of renal origin: Secondary | ICD-10-CM | POA: Diagnosis not present

## 2021-06-23 DIAGNOSIS — N186 End stage renal disease: Secondary | ICD-10-CM | POA: Diagnosis not present

## 2021-06-23 DIAGNOSIS — Z992 Dependence on renal dialysis: Secondary | ICD-10-CM | POA: Diagnosis not present

## 2021-06-25 DIAGNOSIS — N2581 Secondary hyperparathyroidism of renal origin: Secondary | ICD-10-CM | POA: Diagnosis not present

## 2021-06-25 DIAGNOSIS — N186 End stage renal disease: Secondary | ICD-10-CM | POA: Diagnosis not present

## 2021-06-25 DIAGNOSIS — Z992 Dependence on renal dialysis: Secondary | ICD-10-CM | POA: Diagnosis not present

## 2021-06-28 DIAGNOSIS — Z992 Dependence on renal dialysis: Secondary | ICD-10-CM | POA: Diagnosis not present

## 2021-06-28 DIAGNOSIS — N2581 Secondary hyperparathyroidism of renal origin: Secondary | ICD-10-CM | POA: Diagnosis not present

## 2021-06-28 DIAGNOSIS — N186 End stage renal disease: Secondary | ICD-10-CM | POA: Diagnosis not present

## 2021-06-30 DIAGNOSIS — N2581 Secondary hyperparathyroidism of renal origin: Secondary | ICD-10-CM | POA: Diagnosis not present

## 2021-06-30 DIAGNOSIS — Z992 Dependence on renal dialysis: Secondary | ICD-10-CM | POA: Diagnosis not present

## 2021-06-30 DIAGNOSIS — N186 End stage renal disease: Secondary | ICD-10-CM | POA: Diagnosis not present

## 2021-07-01 DIAGNOSIS — Z992 Dependence on renal dialysis: Secondary | ICD-10-CM | POA: Diagnosis not present

## 2021-07-01 DIAGNOSIS — E1129 Type 2 diabetes mellitus with other diabetic kidney complication: Secondary | ICD-10-CM | POA: Diagnosis not present

## 2021-07-01 DIAGNOSIS — N186 End stage renal disease: Secondary | ICD-10-CM | POA: Diagnosis not present

## 2021-07-02 DIAGNOSIS — Z992 Dependence on renal dialysis: Secondary | ICD-10-CM | POA: Diagnosis not present

## 2021-07-02 DIAGNOSIS — N186 End stage renal disease: Secondary | ICD-10-CM | POA: Diagnosis not present

## 2021-07-02 DIAGNOSIS — N2581 Secondary hyperparathyroidism of renal origin: Secondary | ICD-10-CM | POA: Diagnosis not present

## 2021-07-05 DIAGNOSIS — N2581 Secondary hyperparathyroidism of renal origin: Secondary | ICD-10-CM | POA: Diagnosis not present

## 2021-07-05 DIAGNOSIS — N186 End stage renal disease: Secondary | ICD-10-CM | POA: Diagnosis not present

## 2021-07-05 DIAGNOSIS — Z992 Dependence on renal dialysis: Secondary | ICD-10-CM | POA: Diagnosis not present

## 2021-07-07 DIAGNOSIS — N2581 Secondary hyperparathyroidism of renal origin: Secondary | ICD-10-CM | POA: Diagnosis not present

## 2021-07-07 DIAGNOSIS — Z992 Dependence on renal dialysis: Secondary | ICD-10-CM | POA: Diagnosis not present

## 2021-07-07 DIAGNOSIS — N186 End stage renal disease: Secondary | ICD-10-CM | POA: Diagnosis not present

## 2021-07-09 DIAGNOSIS — N186 End stage renal disease: Secondary | ICD-10-CM | POA: Diagnosis not present

## 2021-07-09 DIAGNOSIS — Z992 Dependence on renal dialysis: Secondary | ICD-10-CM | POA: Diagnosis not present

## 2021-07-09 DIAGNOSIS — N2581 Secondary hyperparathyroidism of renal origin: Secondary | ICD-10-CM | POA: Diagnosis not present

## 2021-07-12 DIAGNOSIS — Z992 Dependence on renal dialysis: Secondary | ICD-10-CM | POA: Diagnosis not present

## 2021-07-12 DIAGNOSIS — N2581 Secondary hyperparathyroidism of renal origin: Secondary | ICD-10-CM | POA: Diagnosis not present

## 2021-07-12 DIAGNOSIS — N186 End stage renal disease: Secondary | ICD-10-CM | POA: Diagnosis not present

## 2021-07-14 DIAGNOSIS — Z992 Dependence on renal dialysis: Secondary | ICD-10-CM | POA: Diagnosis not present

## 2021-07-14 DIAGNOSIS — N2581 Secondary hyperparathyroidism of renal origin: Secondary | ICD-10-CM | POA: Diagnosis not present

## 2021-07-14 DIAGNOSIS — N186 End stage renal disease: Secondary | ICD-10-CM | POA: Diagnosis not present

## 2021-07-16 DIAGNOSIS — N186 End stage renal disease: Secondary | ICD-10-CM | POA: Diagnosis not present

## 2021-07-16 DIAGNOSIS — N2581 Secondary hyperparathyroidism of renal origin: Secondary | ICD-10-CM | POA: Diagnosis not present

## 2021-07-16 DIAGNOSIS — Z992 Dependence on renal dialysis: Secondary | ICD-10-CM | POA: Diagnosis not present

## 2021-07-19 DIAGNOSIS — Z992 Dependence on renal dialysis: Secondary | ICD-10-CM | POA: Diagnosis not present

## 2021-07-19 DIAGNOSIS — N2581 Secondary hyperparathyroidism of renal origin: Secondary | ICD-10-CM | POA: Diagnosis not present

## 2021-07-19 DIAGNOSIS — N186 End stage renal disease: Secondary | ICD-10-CM | POA: Diagnosis not present

## 2021-07-21 DIAGNOSIS — N2581 Secondary hyperparathyroidism of renal origin: Secondary | ICD-10-CM | POA: Diagnosis not present

## 2021-07-21 DIAGNOSIS — N186 End stage renal disease: Secondary | ICD-10-CM | POA: Diagnosis not present

## 2021-07-21 DIAGNOSIS — Z992 Dependence on renal dialysis: Secondary | ICD-10-CM | POA: Diagnosis not present

## 2021-07-23 DIAGNOSIS — N186 End stage renal disease: Secondary | ICD-10-CM | POA: Diagnosis not present

## 2021-07-23 DIAGNOSIS — N2581 Secondary hyperparathyroidism of renal origin: Secondary | ICD-10-CM | POA: Diagnosis not present

## 2021-07-23 DIAGNOSIS — Z992 Dependence on renal dialysis: Secondary | ICD-10-CM | POA: Diagnosis not present

## 2021-07-26 DIAGNOSIS — N186 End stage renal disease: Secondary | ICD-10-CM | POA: Diagnosis not present

## 2021-07-26 DIAGNOSIS — Z992 Dependence on renal dialysis: Secondary | ICD-10-CM | POA: Diagnosis not present

## 2021-07-26 DIAGNOSIS — N2581 Secondary hyperparathyroidism of renal origin: Secondary | ICD-10-CM | POA: Diagnosis not present

## 2021-07-28 DIAGNOSIS — Z992 Dependence on renal dialysis: Secondary | ICD-10-CM | POA: Diagnosis not present

## 2021-07-28 DIAGNOSIS — N186 End stage renal disease: Secondary | ICD-10-CM | POA: Diagnosis not present

## 2021-07-28 DIAGNOSIS — N2581 Secondary hyperparathyroidism of renal origin: Secondary | ICD-10-CM | POA: Diagnosis not present

## 2021-07-30 DIAGNOSIS — N2581 Secondary hyperparathyroidism of renal origin: Secondary | ICD-10-CM | POA: Diagnosis not present

## 2021-07-30 DIAGNOSIS — Z992 Dependence on renal dialysis: Secondary | ICD-10-CM | POA: Diagnosis not present

## 2021-07-30 DIAGNOSIS — N186 End stage renal disease: Secondary | ICD-10-CM | POA: Diagnosis not present

## 2021-08-01 DIAGNOSIS — N186 End stage renal disease: Secondary | ICD-10-CM | POA: Diagnosis not present

## 2021-08-01 DIAGNOSIS — E1129 Type 2 diabetes mellitus with other diabetic kidney complication: Secondary | ICD-10-CM | POA: Diagnosis not present

## 2021-08-01 DIAGNOSIS — Z992 Dependence on renal dialysis: Secondary | ICD-10-CM | POA: Diagnosis not present

## 2021-08-02 DIAGNOSIS — N2581 Secondary hyperparathyroidism of renal origin: Secondary | ICD-10-CM | POA: Diagnosis not present

## 2021-08-02 DIAGNOSIS — N186 End stage renal disease: Secondary | ICD-10-CM | POA: Diagnosis not present

## 2021-08-02 DIAGNOSIS — Z992 Dependence on renal dialysis: Secondary | ICD-10-CM | POA: Diagnosis not present

## 2021-08-03 ENCOUNTER — Ambulatory Visit: Payer: Self-pay

## 2021-08-03 NOTE — Patient Outreach (Signed)
  Care Coordination   Initial Visit Note   08/03/2021 Name: Justin [name] MRN: [id number] DOB: [birth year]  Justin [name] is a 71 y.o. year old male who sees Justin [name], Justin Main, MD for primary care. I spoke with  Justin [name] by phone today  What matters to the patients health and wellness today?  Doing well with no concerns    Goals Addressed             This Visit's Progress    COMPLETED: Care Coordination Activities - no follow up required       Care Coordination Interventions: SDoH screening completed - no acute challenges at this time Screened for fall risk - patient low risk Determined the patient does not have any assistive equipment in the home - advised to contact primary care provider if equipment is ever needed Performed chart review to note diabetic eye exam is overdue - patient reports exam was done last November. Discussed plan for patient to complete eye exam in November 2023        SDOH assessments and interventions completed:  Yes  SDOH Interventions Today    Flowsheet Row Most Recent Value  SDOH Interventions   Food Insecurity Interventions Intervention Not Indicated  Housing Interventions Intervention Not Indicated  Transportation Interventions Intervention Not Indicated        Care Coordination Interventions Activated:  Yes  Care Coordination Interventions:  Yes, provided   Follow up plan: No further intervention required.   Encounter Outcome:  Pt. Visit Completed   Justin [name], BSW, CDP Social Worker, Certified Dementia Practitioner Care Coordination [phone]

## 2021-08-03 NOTE — Patient Instructions (Signed)
Visit Information  Thank you for taking time to visit with me today. Please don't hesitate to contact me if I can be of assistance to you.   Following are the goals we discussed today:   Goals Addressed             This Visit's Progress    COMPLETED: Care Coordination Activities - no follow up required       Care Coordination Interventions: SDoH screening completed - no acute challenges at this time Screened for fall risk - patient low risk Determined the patient does not have any assistive equipment in the home - advised to contact primary care provider if equipment is ever needed Performed chart review to note diabetic eye exam is overdue - patient reports exam was done last November. Discussed plan for patient to complete eye exam in November 2023        Please call the care guide team at [phone] if you need to schedule an appointment with me  If you are experiencing a Mental Health or [place] or need someone to talk to, please call [phone]-TALK (toll free, 24 hour hotline)  Patient verbalizes understanding of instructions and care plan provided today and agrees to view in [place]. Active MyChart status and patient understanding of how to access instructions and care plan via MyChart confirmed with patient.     No further follow up required: Please contact your primary care provider as needed.  Daneen Schick, BSW, CDP Social Worker, Certified Dementia Practitioner Care Coordination [phone]

## 2021-08-04 DIAGNOSIS — N2581 Secondary hyperparathyroidism of renal origin: Secondary | ICD-10-CM | POA: Diagnosis not present

## 2021-08-04 DIAGNOSIS — N186 End stage renal disease: Secondary | ICD-10-CM | POA: Diagnosis not present

## 2021-08-04 DIAGNOSIS — Z992 Dependence on renal dialysis: Secondary | ICD-10-CM | POA: Diagnosis not present

## 2021-08-06 DIAGNOSIS — Z992 Dependence on renal dialysis: Secondary | ICD-10-CM | POA: Diagnosis not present

## 2021-08-06 DIAGNOSIS — N186 End stage renal disease: Secondary | ICD-10-CM | POA: Diagnosis not present

## 2021-08-06 DIAGNOSIS — N2581 Secondary hyperparathyroidism of renal origin: Secondary | ICD-10-CM | POA: Diagnosis not present

## 2021-08-09 DIAGNOSIS — Z992 Dependence on renal dialysis: Secondary | ICD-10-CM | POA: Diagnosis not present

## 2021-08-09 DIAGNOSIS — N186 End stage renal disease: Secondary | ICD-10-CM | POA: Diagnosis not present

## 2021-08-09 DIAGNOSIS — N2581 Secondary hyperparathyroidism of renal origin: Secondary | ICD-10-CM | POA: Diagnosis not present

## 2021-08-11 DIAGNOSIS — Z992 Dependence on renal dialysis: Secondary | ICD-10-CM | POA: Diagnosis not present

## 2021-08-11 DIAGNOSIS — N186 End stage renal disease: Secondary | ICD-10-CM | POA: Diagnosis not present

## 2021-08-11 DIAGNOSIS — N2581 Secondary hyperparathyroidism of renal origin: Secondary | ICD-10-CM | POA: Diagnosis not present

## 2021-08-13 DIAGNOSIS — Z992 Dependence on renal dialysis: Secondary | ICD-10-CM | POA: Diagnosis not present

## 2021-08-13 DIAGNOSIS — N186 End stage renal disease: Secondary | ICD-10-CM | POA: Diagnosis not present

## 2021-08-13 DIAGNOSIS — N2581 Secondary hyperparathyroidism of renal origin: Secondary | ICD-10-CM | POA: Diagnosis not present

## 2021-08-16 DIAGNOSIS — N2581 Secondary hyperparathyroidism of renal origin: Secondary | ICD-10-CM | POA: Diagnosis not present

## 2021-08-16 DIAGNOSIS — Z992 Dependence on renal dialysis: Secondary | ICD-10-CM | POA: Diagnosis not present

## 2021-08-16 DIAGNOSIS — N186 End stage renal disease: Secondary | ICD-10-CM | POA: Diagnosis not present

## 2021-08-18 DIAGNOSIS — N2581 Secondary hyperparathyroidism of renal origin: Secondary | ICD-10-CM | POA: Diagnosis not present

## 2021-08-18 DIAGNOSIS — Z992 Dependence on renal dialysis: Secondary | ICD-10-CM | POA: Diagnosis not present

## 2021-08-18 DIAGNOSIS — N186 End stage renal disease: Secondary | ICD-10-CM | POA: Diagnosis not present

## 2021-08-20 DIAGNOSIS — Z992 Dependence on renal dialysis: Secondary | ICD-10-CM | POA: Diagnosis not present

## 2021-08-20 DIAGNOSIS — N186 End stage renal disease: Secondary | ICD-10-CM | POA: Diagnosis not present

## 2021-08-20 DIAGNOSIS — N2581 Secondary hyperparathyroidism of renal origin: Secondary | ICD-10-CM | POA: Diagnosis not present

## 2021-08-23 DIAGNOSIS — Z992 Dependence on renal dialysis: Secondary | ICD-10-CM | POA: Diagnosis not present

## 2021-08-23 DIAGNOSIS — N186 End stage renal disease: Secondary | ICD-10-CM | POA: Diagnosis not present

## 2021-08-23 DIAGNOSIS — N2581 Secondary hyperparathyroidism of renal origin: Secondary | ICD-10-CM | POA: Diagnosis not present

## 2021-08-25 DIAGNOSIS — N2581 Secondary hyperparathyroidism of renal origin: Secondary | ICD-10-CM | POA: Diagnosis not present

## 2021-08-25 DIAGNOSIS — N186 End stage renal disease: Secondary | ICD-10-CM | POA: Diagnosis not present

## 2021-08-25 DIAGNOSIS — Z992 Dependence on renal dialysis: Secondary | ICD-10-CM | POA: Diagnosis not present

## 2021-08-27 DIAGNOSIS — Z992 Dependence on renal dialysis: Secondary | ICD-10-CM | POA: Diagnosis not present

## 2021-08-27 DIAGNOSIS — N2581 Secondary hyperparathyroidism of renal origin: Secondary | ICD-10-CM | POA: Diagnosis not present

## 2021-08-27 DIAGNOSIS — N186 End stage renal disease: Secondary | ICD-10-CM | POA: Diagnosis not present

## 2021-08-30 DIAGNOSIS — N2581 Secondary hyperparathyroidism of renal origin: Secondary | ICD-10-CM | POA: Diagnosis not present

## 2021-08-30 DIAGNOSIS — Z992 Dependence on renal dialysis: Secondary | ICD-10-CM | POA: Diagnosis not present

## 2021-08-30 DIAGNOSIS — N186 End stage renal disease: Secondary | ICD-10-CM | POA: Diagnosis not present

## 2021-09-01 DIAGNOSIS — N2581 Secondary hyperparathyroidism of renal origin: Secondary | ICD-10-CM | POA: Diagnosis not present

## 2021-09-01 DIAGNOSIS — E1129 Type 2 diabetes mellitus with other diabetic kidney complication: Secondary | ICD-10-CM | POA: Diagnosis not present

## 2021-09-01 DIAGNOSIS — N186 End stage renal disease: Secondary | ICD-10-CM | POA: Diagnosis not present

## 2021-09-01 DIAGNOSIS — Z992 Dependence on renal dialysis: Secondary | ICD-10-CM | POA: Diagnosis not present

## 2021-09-03 DIAGNOSIS — N186 End stage renal disease: Secondary | ICD-10-CM | POA: Diagnosis not present

## 2021-09-03 DIAGNOSIS — N2581 Secondary hyperparathyroidism of renal origin: Secondary | ICD-10-CM | POA: Diagnosis not present

## 2021-09-03 DIAGNOSIS — Z992 Dependence on renal dialysis: Secondary | ICD-10-CM | POA: Diagnosis not present

## 2021-09-06 DIAGNOSIS — N2581 Secondary hyperparathyroidism of renal origin: Secondary | ICD-10-CM | POA: Diagnosis not present

## 2021-09-06 DIAGNOSIS — Z992 Dependence on renal dialysis: Secondary | ICD-10-CM | POA: Diagnosis not present

## 2021-09-06 DIAGNOSIS — N186 End stage renal disease: Secondary | ICD-10-CM | POA: Diagnosis not present

## 2021-09-06 NOTE — Progress Notes (Unsigned)
Cardiology Office Note:    Date:  09/07/2021   ID:  Justin [name], DOB 1950-05-11, MRN [id number]  PCP:  Reynold Bowen, MD  Cardiologist:  Shirlee More, MD    Referring MD: Reynold Bowen, MD    ASSESSMENT:    1. Hypertensive heart disease with congestive heart failure and stage 5 kidney disease ([place])   2. Coronary artery disease of native artery of native heart with stable angina pectoris ([place])   3. Nonrheumatic aortic valve stenosis   4. RBBB (right bundle branch block with left anterior fascicular block)   5. Hyperlipidemia LDL goal <70    PLAN:    In order of problems listed above:  Stable well managed with dialysis and current antihypertensive medications he will continue his oral nitrate hydralazine and reduce beta-blocker frequency. Stable CAD having no angina continue current medical treatment clinic aspirin his high intensity statin Aortic stenosis is mild I will give her a copy of the report so she can handcarried to the dialysis program Stable EKG I did ask him to purchase an Apple Watch to track his heart rhythm for safety with a combination of bifascicular heart block and stage V CKD with renal replacement therapy. Continue his high intensity statin Labs are followed in dialysis program   Next appointment: 6 months we will repeat an echocardiogram in October.   Medication Adjustments/Labs and Tests Ordered: Current medicines are reviewed at length with the patient today.  Concerns regarding medicines are outlined above.  No orders of the defined types were placed in this encounter.  No orders of the defined types were placed in this encounter.   No chief complaint on file.   History of Present Illness:    Justin [name] is a 71 y.o. male with a hx of hypertensive heart and kidney disease with chronic diastolic heart failure stage V CKD with renal replacement therapy hemodialysis coronary artery disease hyperlipidemia and a history of a subdural hematoma last  seen 03/02/2021.  He was Hospitalized April 2020 with hypertensive urgency and congestive heart failure. Echo 04/2018 with LVEF 35-40%, moderate LV enlargement, moderate LA enlargement, small pericardial effusion, moderate aortic stenosis. LHC with 50-60% mid LAD, apical 90% LAD, 75% first diagonal, 75% Cx, occluded first marginal, 50% RCA, 95% PDA. RHC with mild aortic stenosis. He was recommended for medical therapy.  He also has bifascicular heart block   He was seen by cardiology at [facility] for evaluation of minimally positive nuclear stress test (mild inducible ischemia of apical inferolateral wall) as part of pre transplant evaluation. LHC 04/28/20 with LAD showing mild plaque (25% proximal, 20% distal), D1 20% proximal plaque, ramus 10% prox plaque, RCA 45% mid, 75% prox focal stenosis in long PDA, small right PL 60-70% plaque. Recommended for medical therapy   He was seen in the ED 07/12/20 with subacute on chronic R SDH after fall 1 week prior. He went to the OR 07/13/20 for R burr hold for SDH evaluation. He received 1u PRBC for Hb <7.    Most recent echocardiogram 10/18/2020 shows moderate concentric LVH normal left ventricular size low normal ejection fraction 50 to 55% with elevated left atrial pressure.  Right ventricular systolic function was mildly reduced with normal pulmonary artery pressure small pericardial effusion is present there is mild mitral regurgitation mild aortic valve stenosis with peak and mean gradients of 28 and 16 mmHg mild enlargement of the ascending aorta.   Compliance with diet, lifestyle and medications: Yes  His wife alerts  me that he is a dialysis 08/18/2021 his heart rate was apparently slow and his beta-blocker was decreased in frequency There unaware of any details I asked him to continue his current beta-blocker and purchase an Apple Watch to track his heart rhythm for low rates and heart rate distribution. He is not having edema shortness of breath chest pain  palpitation or syncope  They show me a letter from the transplant program at [facility] that says he has moderate aortic stenosis and is not a candidate.  I reviewed her echocardiogram from October 2022 and his aortic stenosis was mild.  Utilized an event monitor reported 03/26/2021 showing rare ventricular ectopy and rare supraventricular ectopy Mobitz 1 second-degree AV block without pauses or higher degree heart block. Patch Wear Time:  7 days and 20 hours (2023-03-01T16:45:59-0500 to 2023-03-09T13:25:18-0500)   Patient had a min HR of 31 bpm, max HR of 141 bpm, and avg HR of 61 bpm. Predominant underlying rhythm was Sinus Rhythm. First Degree AV Block was present. Bundle Branch Block/IVCD was present.    Second Degree AV Block-Mobitz I (Wenckebach) was present infrequently.  There were no pauses of 3 seconds or greater.   There was 1 run of Ventricular Tachycardia occurred lasting 4 beats with an avg 134 bpm.    4 Supraventricular Tachycardia runs occurred, the run with the fastest interval lasting 34.2 secs with a max rate of 100 bpm, the longest lasting 55.9 secs  with an avg rate of 94 bpm. . Isolated SVEs were rare (<1.0%), SVE Couplets were rare (<1.0%), and SVE Triplets were rare (<1.0%).  There were no episodes of atrial fibrillation or flutter   Isolated VEs were rare (<1.0%), VE Couplets were rare (<1.0%), and  no VE Triplets were present. Ventricular Bigeminy and Trigeminy were present. Past Medical History:  Diagnosis Date   BPH (benign prostatic hyperplasia)    Chronic kidney disease (CKD), stage IV (severe) (HCC)    Diabetes (HCC)    Dysuria    Erectile dysfunction    Hyperlipemia    Hypertension    Pinna disorder, left    Renal disorder    Wears glasses     Past Surgical History:  Procedure Laterality Date   AV FISTULA PLACEMENT Left 04/30/2018   Procedure: CREATION OF RADIOCEPHALIC ARTERIOVENOUS FISTULA LEFT ARM;  Surgeon: Elam Dutch, MD;  Location: [facility] OR;   Service: Vascular;  Laterality: Left;   AV FISTULA PLACEMENT Left 08/28/2018   Procedure: ARTERIOVENOUS (AV) BRACHIOCEPHALIC FISTULA CREATION LEFT UPPER ARM;  Surgeon: Marty Heck, MD;  Location: [facility] OR;  Service: Vascular;  Laterality: Left;   IR FLUORO GUIDE CV LINE RIGHT  04/29/2018   IR US GUIDE VASC ACCESS RIGHT  04/29/2018   RIGHT/LEFT HEART CATH AND CORONARY ANGIOGRAPHY N/A 05/01/2018   Procedure: RIGHT/LEFT HEART CATH AND CORONARY ANGIOGRAPHY;  Surgeon: Belva Crome, MD;  Location: [place] CV LAB;  Service: Cardiovascular;  Laterality: N/A;   subdural hematoma Right 07/2020    Current Medications: Current Meds  Medication Sig   aspirin EC 81 MG tablet Take 81 mg by mouth daily. Swallow whole.   calcium acetate (PHOSLO) 667 MG capsule Take 2,668 mg by mouth daily.   hydrALAZINE (APRESOLINE) 25 MG tablet Take 50 mg by mouth 3 (three) times daily. On Tues, Thurs, Saturday   isosorbide mononitrate (IMDUR) 30 MG 24 hr tablet TAKE 1 TABLET BY MOUTH IN THE MORNING ON  NON  DIALYSIS  DAYS   metoprolol succinate (TOPROL-XL)  50 MG 24 hr tablet Take 50 mg by mouth daily. Take 2 tablets 2xs a day mon,wed,fri,sun   nitroGLYCERIN (NITROSTAT) 0.4 MG SL tablet Place 1 tablet (0.4 mg total) under the tongue every 5 (five) minutes as needed.   NOVOLIN 70/30 RELION (70-30) 100 UNIT/ML injection Inject 20 Units into the skin daily. (Patient taking differently: Inject 20 Units into the skin See admin instructions. Inject 20 units subcutaneously with breakfast on Sunday, Monday, Wednesday, Friday; inject 20 units with lunch on Tuesday, Thursday, Saturday (dialysis days))   rosuvastatin (CRESTOR) 10 MG tablet Take 10 mg by mouth daily.     Allergies:   Patient has no known allergies.   Social History   Socioeconomic History   Marital status: Divorced    Spouse name: Not on file   Number of children: 1   Years of education: Not on file   Highest education level: Not on file   Occupational History   Not on file  Tobacco Use   Smoking status: Never   Smokeless tobacco: Never  Vaping Use   Vaping Use: Never used  Substance and Sexual Activity   Alcohol use: Not Currently   Drug use: Never   Sexual activity: Not Currently  Other Topics Concern   Not on file  Social History Narrative   Not on file   Social Determinants of Health   Financial Resource Strain: Not on file  Food Insecurity: No Food Insecurity (08/03/2021)   Hunger Vital Sign    Worried About Running Out of Food in the Last Year: Never true    Ran Out of Food in the Last Year: Never true  Transportation Needs: No Transportation Needs (08/03/2021)   PRAPARE - Hydrologist (Medical): No    Lack of Transportation (Non-Medical): No  Physical Activity: Not on file  Stress: Not on file  Social Connections: Not on file     Family History: The patient's family history includes Diabetes in his father; Stroke in his mother. ROS:   Please see the history of present illness.    All other systems reviewed and are negative.  EKGs/Labs/Other Studies Reviewed:    The following studies were reviewed today:  EKG:  EKG ordered today and personally reviewed.  The ekg ordered today demonstrates sinus rhythm bifascicular heart block first-degree AV block  Recent Labs: No results found for requested labs within last 365 days.  Recent Lipid Panel    Component Value Date/Time   CHOL 109 04/25/2018 0429   TRIG 73 04/25/2018 0429   HDL 34 (L) 04/25/2018 0429   CHOLHDL 3.2 04/25/2018 0429   VLDL 15 04/25/2018 0429   LDLCALC 60 04/25/2018 0429   LDLDIRECT 32 06/05/2019 1639    Physical Exam:    VS:  BP 138/62 (BP Location: Right Arm, Patient Position: Sitting)   Pulse 65   Ht 5\' 9"  (1.753 m)   Wt 182 lb 9.6 oz (82.8 kg)   SpO2 97%   BMI 26.97 kg/m     Wt Readings from Last 3 Encounters:  09/07/21 182 lb 9.6 oz (82.8 kg)  03/02/21 184 lb (83.5 kg)  10/27/20 173 lb  (78.5 kg)     GEN:  Well nourished, well developed in no acute distress HEENT: Normal NECK: No JVD; No carotid bruits LYMPHATICS: No lymphadenopathy CARDIAC: RRR, no murmurs, rubs, gallops RESPIRATORY:  Clear to auscultation without rales, wheezing or rhonchi  ABDOMEN: Soft, non-tender, non-distended MUSCULOSKELETAL:  No edema; No  deformity  SKIN: Warm and dry NEUROLOGIC:  Alert and oriented x 3 PSYCHIATRIC:  Normal affect    Signed, Shirlee More, MD  09/07/2021 12:45 PM     [facility]

## 2021-09-07 ENCOUNTER — Ambulatory Visit: Payer: Medicare HMO | Attending: Cardiology | Admitting: Cardiology

## 2021-09-07 ENCOUNTER — Encounter: Payer: Self-pay | Admitting: Cardiology

## 2021-09-07 VITALS — BP 138/62 | HR 65 | Ht 69.0 in | Wt 182.6 lb

## 2021-09-07 DIAGNOSIS — I452 Bifascicular block: Secondary | ICD-10-CM | POA: Diagnosis not present

## 2021-09-07 DIAGNOSIS — I132 Hypertensive heart and chronic kidney disease with heart failure and with stage 5 chronic kidney disease, or end stage renal disease: Secondary | ICD-10-CM | POA: Diagnosis not present

## 2021-09-07 DIAGNOSIS — I25118 Atherosclerotic heart disease of native coronary artery with other forms of angina pectoris: Secondary | ICD-10-CM

## 2021-09-07 DIAGNOSIS — E785 Hyperlipidemia, unspecified: Secondary | ICD-10-CM

## 2021-09-07 DIAGNOSIS — N185 Chronic kidney disease, stage 5: Secondary | ICD-10-CM | POA: Diagnosis not present

## 2021-09-07 DIAGNOSIS — I350 Nonrheumatic aortic (valve) stenosis: Secondary | ICD-10-CM | POA: Diagnosis not present

## 2021-09-07 NOTE — Patient Instructions (Addendum)
Medication Instructions:  Your physician recommends that you continue on your current medications as directed. Please refer to the Current Medication list given to you today.  *If you need a refill on your cardiac medications before your next appointment, please call your pharmacy*   Lab Work: None If you have labs (blood work) drawn today and your tests are completely normal, you will receive your results only by: [place] (if you have MyChart) OR A paper copy in the mail If you have any lab test that is abnormal or we need to change your treatment, we will call you to review the results.   Testing/Procedures: Your physician has requested that you have an echocardiogram in November. Echocardiography is a painless test that uses sound waves to create images of your heart. It provides your doctor with information about the size and shape of your heart and how well your heart's chambers and valves are working. This procedure takes approximately one hour. There are no restrictions for this procedure.    Follow-Up: At [facility], you and your health needs are our priority.  As part of our continuing mission to provide you with exceptional heart care, we have created designated Provider Care Teams.  These Care Teams include your primary Cardiologist (physician) and Advanced Practice Providers (APPs -  Physician Assistants and Nurse Practitioners) who all work together to provide you with the care you need, when you need it.  We recommend signing up for the patient portal called "MyChart".  Sign up information is provided on this After Visit Summary.  MyChart is used to connect with patients for Virtual Visits (Telemedicine).  Patients are able to view lab/test results, encounter notes, upcoming appointments, etc.  Non-urgent messages can be sent to your provider as well.   To learn more about what you can do with MyChart, go to [email or web address].    Your next appointment:    6 month(s)  The format for your next appointment:   In Person  Provider:   Shirlee More, MD    Other Instructions Purchase an Apple watch.  Important Information About Sugar

## 2021-09-08 DIAGNOSIS — N186 End stage renal disease: Secondary | ICD-10-CM | POA: Diagnosis not present

## 2021-09-08 DIAGNOSIS — Z992 Dependence on renal dialysis: Secondary | ICD-10-CM | POA: Diagnosis not present

## 2021-09-08 DIAGNOSIS — N2581 Secondary hyperparathyroidism of renal origin: Secondary | ICD-10-CM | POA: Diagnosis not present

## 2021-09-10 DIAGNOSIS — N2581 Secondary hyperparathyroidism of renal origin: Secondary | ICD-10-CM | POA: Diagnosis not present

## 2021-09-10 DIAGNOSIS — N186 End stage renal disease: Secondary | ICD-10-CM | POA: Diagnosis not present

## 2021-09-10 DIAGNOSIS — Z992 Dependence on renal dialysis: Secondary | ICD-10-CM | POA: Diagnosis not present

## 2021-09-13 DIAGNOSIS — Z992 Dependence on renal dialysis: Secondary | ICD-10-CM | POA: Diagnosis not present

## 2021-09-13 DIAGNOSIS — N186 End stage renal disease: Secondary | ICD-10-CM | POA: Diagnosis not present

## 2021-09-13 DIAGNOSIS — N2581 Secondary hyperparathyroidism of renal origin: Secondary | ICD-10-CM | POA: Diagnosis not present

## 2021-09-15 DIAGNOSIS — N186 End stage renal disease: Secondary | ICD-10-CM | POA: Diagnosis not present

## 2021-09-15 DIAGNOSIS — N2581 Secondary hyperparathyroidism of renal origin: Secondary | ICD-10-CM | POA: Diagnosis not present

## 2021-09-15 DIAGNOSIS — Z992 Dependence on renal dialysis: Secondary | ICD-10-CM | POA: Diagnosis not present

## 2021-09-17 DIAGNOSIS — Z992 Dependence on renal dialysis: Secondary | ICD-10-CM | POA: Diagnosis not present

## 2021-09-17 DIAGNOSIS — N186 End stage renal disease: Secondary | ICD-10-CM | POA: Diagnosis not present

## 2021-09-17 DIAGNOSIS — N2581 Secondary hyperparathyroidism of renal origin: Secondary | ICD-10-CM | POA: Diagnosis not present

## 2021-09-20 DIAGNOSIS — Z992 Dependence on renal dialysis: Secondary | ICD-10-CM | POA: Diagnosis not present

## 2021-09-20 DIAGNOSIS — N2581 Secondary hyperparathyroidism of renal origin: Secondary | ICD-10-CM | POA: Diagnosis not present

## 2021-09-20 DIAGNOSIS — N186 End stage renal disease: Secondary | ICD-10-CM | POA: Diagnosis not present

## 2021-09-22 DIAGNOSIS — Z992 Dependence on renal dialysis: Secondary | ICD-10-CM | POA: Diagnosis not present

## 2021-09-22 DIAGNOSIS — N2581 Secondary hyperparathyroidism of renal origin: Secondary | ICD-10-CM | POA: Diagnosis not present

## 2021-09-22 DIAGNOSIS — N186 End stage renal disease: Secondary | ICD-10-CM | POA: Diagnosis not present

## 2021-09-24 DIAGNOSIS — N2581 Secondary hyperparathyroidism of renal origin: Secondary | ICD-10-CM | POA: Diagnosis not present

## 2021-09-24 DIAGNOSIS — N186 End stage renal disease: Secondary | ICD-10-CM | POA: Diagnosis not present

## 2021-09-24 DIAGNOSIS — Z992 Dependence on renal dialysis: Secondary | ICD-10-CM | POA: Diagnosis not present

## 2021-09-27 DIAGNOSIS — N186 End stage renal disease: Secondary | ICD-10-CM | POA: Diagnosis not present

## 2021-09-27 DIAGNOSIS — N2581 Secondary hyperparathyroidism of renal origin: Secondary | ICD-10-CM | POA: Diagnosis not present

## 2021-09-27 DIAGNOSIS — Z992 Dependence on renal dialysis: Secondary | ICD-10-CM | POA: Diagnosis not present

## 2021-09-29 DIAGNOSIS — N2581 Secondary hyperparathyroidism of renal origin: Secondary | ICD-10-CM | POA: Diagnosis not present

## 2021-09-29 DIAGNOSIS — Z992 Dependence on renal dialysis: Secondary | ICD-10-CM | POA: Diagnosis not present

## 2021-09-29 DIAGNOSIS — N186 End stage renal disease: Secondary | ICD-10-CM | POA: Diagnosis not present

## 2021-10-01 DIAGNOSIS — Z992 Dependence on renal dialysis: Secondary | ICD-10-CM | POA: Diagnosis not present

## 2021-10-01 DIAGNOSIS — N186 End stage renal disease: Secondary | ICD-10-CM | POA: Diagnosis not present

## 2021-10-01 DIAGNOSIS — E1129 Type 2 diabetes mellitus with other diabetic kidney complication: Secondary | ICD-10-CM | POA: Diagnosis not present

## 2021-10-01 DIAGNOSIS — N2581 Secondary hyperparathyroidism of renal origin: Secondary | ICD-10-CM | POA: Diagnosis not present

## 2021-10-04 DIAGNOSIS — Z992 Dependence on renal dialysis: Secondary | ICD-10-CM | POA: Diagnosis not present

## 2021-10-04 DIAGNOSIS — N2581 Secondary hyperparathyroidism of renal origin: Secondary | ICD-10-CM | POA: Diagnosis not present

## 2021-10-04 DIAGNOSIS — N186 End stage renal disease: Secondary | ICD-10-CM | POA: Diagnosis not present

## 2021-10-06 DIAGNOSIS — N186 End stage renal disease: Secondary | ICD-10-CM | POA: Diagnosis not present

## 2021-10-06 DIAGNOSIS — N2581 Secondary hyperparathyroidism of renal origin: Secondary | ICD-10-CM | POA: Diagnosis not present

## 2021-10-06 DIAGNOSIS — Z992 Dependence on renal dialysis: Secondary | ICD-10-CM | POA: Diagnosis not present

## 2021-10-08 DIAGNOSIS — N2581 Secondary hyperparathyroidism of renal origin: Secondary | ICD-10-CM | POA: Diagnosis not present

## 2021-10-08 DIAGNOSIS — N186 End stage renal disease: Secondary | ICD-10-CM | POA: Diagnosis not present

## 2021-10-08 DIAGNOSIS — Z992 Dependence on renal dialysis: Secondary | ICD-10-CM | POA: Diagnosis not present

## 2021-10-10 ENCOUNTER — Telehealth: Payer: Self-pay | Admitting: Cardiology

## 2021-10-10 NOTE — Telephone Encounter (Signed)
Patient's friend, Tye Maryland, is calling in on behalf of the patient, requesting to switch providers from Dr. Bettina Gavia to Dr. Agustin Cree.  Tye Maryland also mentions interest in having Dr. Agustin Cree read 11/15 echo as well if at all possible.  Please advise when able, Thank you

## 2021-10-11 DIAGNOSIS — Z992 Dependence on renal dialysis: Secondary | ICD-10-CM | POA: Diagnosis not present

## 2021-10-11 DIAGNOSIS — N2581 Secondary hyperparathyroidism of renal origin: Secondary | ICD-10-CM | POA: Diagnosis not present

## 2021-10-11 DIAGNOSIS — N186 End stage renal disease: Secondary | ICD-10-CM | POA: Diagnosis not present

## 2021-10-13 ENCOUNTER — Telehealth: Payer: Self-pay | Admitting: Cardiology

## 2021-10-13 DIAGNOSIS — N2581 Secondary hyperparathyroidism of renal origin: Secondary | ICD-10-CM | POA: Diagnosis not present

## 2021-10-13 DIAGNOSIS — Z992 Dependence on renal dialysis: Secondary | ICD-10-CM | POA: Diagnosis not present

## 2021-10-13 DIAGNOSIS — N186 End stage renal disease: Secondary | ICD-10-CM | POA: Diagnosis not present

## 2021-10-13 NOTE — Telephone Encounter (Signed)
[employer] and informed her of Dr. Oren Binet response to contact the patients nephrologist regarding the patient's high blood pressure. She stated that she will try to contact the nephrologist office tomorrow and had no further questions at this time.

## 2021-10-13 NOTE — Telephone Encounter (Signed)
Pt c/o BP issue: STAT if pt c/o blurred vision, one-sided weakness or slurred speech  1. What are your last 5 BP readings? 196/86 This last week # has been over 200 Dialysis has been having trouble getting his bp down   2. Are you having any other symptoms (ex. Dizziness, headache, blurred vision, passed out)? Pressure on chest Monday morning   3. What is your BP issue? Pt's friend is concerned about patients bp and would like a call back to discuss.

## 2021-10-13 NOTE — Telephone Encounter (Signed)
Justin [name], the patients friend and she reported that his blood pressure has been running high. The latest blood pressure is 196/86 and the blood pressures before that have been over 974 systolically. The patient has dialysis 3 times per week and they have been having trouble getting his blood pressure down during dialysis. He also reported to his friend that on Monday morning that he had an episode of chest pressure that was relieved with rest. He has not had any other episodes of chest pain, pressure or discomfort since Monday morning.

## 2021-10-15 DIAGNOSIS — N2581 Secondary hyperparathyroidism of renal origin: Secondary | ICD-10-CM | POA: Diagnosis not present

## 2021-10-15 DIAGNOSIS — Z992 Dependence on renal dialysis: Secondary | ICD-10-CM | POA: Diagnosis not present

## 2021-10-15 DIAGNOSIS — N186 End stage renal disease: Secondary | ICD-10-CM | POA: Diagnosis not present

## 2021-10-18 DIAGNOSIS — N2581 Secondary hyperparathyroidism of renal origin: Secondary | ICD-10-CM | POA: Diagnosis not present

## 2021-10-18 DIAGNOSIS — Z992 Dependence on renal dialysis: Secondary | ICD-10-CM | POA: Diagnosis not present

## 2021-10-18 DIAGNOSIS — N186 End stage renal disease: Secondary | ICD-10-CM | POA: Diagnosis not present

## 2021-10-20 DIAGNOSIS — N2581 Secondary hyperparathyroidism of renal origin: Secondary | ICD-10-CM | POA: Diagnosis not present

## 2021-10-20 DIAGNOSIS — N186 End stage renal disease: Secondary | ICD-10-CM | POA: Diagnosis not present

## 2021-10-20 DIAGNOSIS — Z992 Dependence on renal dialysis: Secondary | ICD-10-CM | POA: Diagnosis not present

## 2021-10-22 DIAGNOSIS — N2581 Secondary hyperparathyroidism of renal origin: Secondary | ICD-10-CM | POA: Diagnosis not present

## 2021-10-22 DIAGNOSIS — N186 End stage renal disease: Secondary | ICD-10-CM | POA: Diagnosis not present

## 2021-10-22 DIAGNOSIS — Z992 Dependence on renal dialysis: Secondary | ICD-10-CM | POA: Diagnosis not present

## 2021-10-25 DIAGNOSIS — N2581 Secondary hyperparathyroidism of renal origin: Secondary | ICD-10-CM | POA: Diagnosis not present

## 2021-10-25 DIAGNOSIS — Z992 Dependence on renal dialysis: Secondary | ICD-10-CM | POA: Diagnosis not present

## 2021-10-25 DIAGNOSIS — N186 End stage renal disease: Secondary | ICD-10-CM | POA: Diagnosis not present

## 2021-10-27 DIAGNOSIS — Z992 Dependence on renal dialysis: Secondary | ICD-10-CM | POA: Diagnosis not present

## 2021-10-27 DIAGNOSIS — N186 End stage renal disease: Secondary | ICD-10-CM | POA: Diagnosis not present

## 2021-10-27 DIAGNOSIS — N2581 Secondary hyperparathyroidism of renal origin: Secondary | ICD-10-CM | POA: Diagnosis not present

## 2021-10-29 DIAGNOSIS — N186 End stage renal disease: Secondary | ICD-10-CM | POA: Diagnosis not present

## 2021-10-29 DIAGNOSIS — N2581 Secondary hyperparathyroidism of renal origin: Secondary | ICD-10-CM | POA: Diagnosis not present

## 2021-10-29 DIAGNOSIS — Z992 Dependence on renal dialysis: Secondary | ICD-10-CM | POA: Diagnosis not present

## 2021-11-01 DIAGNOSIS — N2581 Secondary hyperparathyroidism of renal origin: Secondary | ICD-10-CM | POA: Diagnosis not present

## 2021-11-01 DIAGNOSIS — Z992 Dependence on renal dialysis: Secondary | ICD-10-CM | POA: Diagnosis not present

## 2021-11-01 DIAGNOSIS — N186 End stage renal disease: Secondary | ICD-10-CM | POA: Diagnosis not present

## 2021-11-01 DIAGNOSIS — E1129 Type 2 diabetes mellitus with other diabetic kidney complication: Secondary | ICD-10-CM | POA: Diagnosis not present

## 2021-11-03 DIAGNOSIS — Z992 Dependence on renal dialysis: Secondary | ICD-10-CM | POA: Diagnosis not present

## 2021-11-03 DIAGNOSIS — N186 End stage renal disease: Secondary | ICD-10-CM | POA: Diagnosis not present

## 2021-11-03 DIAGNOSIS — N2581 Secondary hyperparathyroidism of renal origin: Secondary | ICD-10-CM | POA: Diagnosis not present

## 2021-11-05 DIAGNOSIS — Z992 Dependence on renal dialysis: Secondary | ICD-10-CM | POA: Diagnosis not present

## 2021-11-05 DIAGNOSIS — N2581 Secondary hyperparathyroidism of renal origin: Secondary | ICD-10-CM | POA: Diagnosis not present

## 2021-11-05 DIAGNOSIS — N186 End stage renal disease: Secondary | ICD-10-CM | POA: Diagnosis not present

## 2021-11-08 DIAGNOSIS — Z992 Dependence on renal dialysis: Secondary | ICD-10-CM | POA: Diagnosis not present

## 2021-11-08 DIAGNOSIS — N186 End stage renal disease: Secondary | ICD-10-CM | POA: Diagnosis not present

## 2021-11-08 DIAGNOSIS — N2581 Secondary hyperparathyroidism of renal origin: Secondary | ICD-10-CM | POA: Diagnosis not present

## 2021-11-10 DIAGNOSIS — N2581 Secondary hyperparathyroidism of renal origin: Secondary | ICD-10-CM | POA: Diagnosis not present

## 2021-11-10 DIAGNOSIS — N186 End stage renal disease: Secondary | ICD-10-CM | POA: Diagnosis not present

## 2021-11-10 DIAGNOSIS — Z992 Dependence on renal dialysis: Secondary | ICD-10-CM | POA: Diagnosis not present

## 2021-11-12 DIAGNOSIS — N2581 Secondary hyperparathyroidism of renal origin: Secondary | ICD-10-CM | POA: Diagnosis not present

## 2021-11-12 DIAGNOSIS — Z992 Dependence on renal dialysis: Secondary | ICD-10-CM | POA: Diagnosis not present

## 2021-11-12 DIAGNOSIS — N186 End stage renal disease: Secondary | ICD-10-CM | POA: Diagnosis not present

## 2021-11-15 DIAGNOSIS — N186 End stage renal disease: Secondary | ICD-10-CM | POA: Diagnosis not present

## 2021-11-15 DIAGNOSIS — Z992 Dependence on renal dialysis: Secondary | ICD-10-CM | POA: Diagnosis not present

## 2021-11-16 ENCOUNTER — Ambulatory Visit: Payer: Medicare HMO | Attending: Cardiology

## 2021-11-16 DIAGNOSIS — I25118 Atherosclerotic heart disease of native coronary artery with other forms of angina pectoris: Secondary | ICD-10-CM

## 2021-11-16 DIAGNOSIS — I350 Nonrheumatic aortic (valve) stenosis: Secondary | ICD-10-CM

## 2021-11-16 DIAGNOSIS — I452 Bifascicular block: Secondary | ICD-10-CM

## 2021-11-16 DIAGNOSIS — N185 Chronic kidney disease, stage 5: Secondary | ICD-10-CM

## 2021-11-16 DIAGNOSIS — E785 Hyperlipidemia, unspecified: Secondary | ICD-10-CM

## 2021-11-16 DIAGNOSIS — I132 Hypertensive heart and chronic kidney disease with heart failure and with stage 5 chronic kidney disease, or end stage renal disease: Secondary | ICD-10-CM

## 2021-11-16 LAB — ECHOCARDIOGRAM COMPLETE
AR max vel: 1.23 cm2
AV Area VTI: 1.2 cm2
AV Area mean vel: 1.19 cm2
AV Mean grad: 13.7 mmHg
AV Peak grad: 24.7 mmHg
Ao pk vel: 2.49 m/s
Area-P 1/2: 3.37 cm2
Calc EF: 50.5 %
S' Lateral: 3.5 cm
Single Plane A2C EF: 46.6 %
Single Plane A4C EF: 51.4 %

## 2021-11-17 DIAGNOSIS — Z992 Dependence on renal dialysis: Secondary | ICD-10-CM | POA: Diagnosis not present

## 2021-11-17 DIAGNOSIS — N186 End stage renal disease: Secondary | ICD-10-CM | POA: Diagnosis not present

## 2021-11-19 DIAGNOSIS — N186 End stage renal disease: Secondary | ICD-10-CM | POA: Diagnosis not present

## 2021-11-19 DIAGNOSIS — Z992 Dependence on renal dialysis: Secondary | ICD-10-CM | POA: Diagnosis not present

## 2021-11-21 ENCOUNTER — Encounter: Payer: Self-pay | Admitting: Cardiology

## 2021-11-21 ENCOUNTER — Ambulatory Visit: Payer: Medicare HMO | Attending: Cardiology | Admitting: Cardiology

## 2021-11-21 VITALS — BP 188/60 | HR 60 | Ht 69.0 in | Wt 180.4 lb

## 2021-11-21 DIAGNOSIS — E1122 Type 2 diabetes mellitus with diabetic chronic kidney disease: Secondary | ICD-10-CM

## 2021-11-21 DIAGNOSIS — I2510 Atherosclerotic heart disease of native coronary artery without angina pectoris: Secondary | ICD-10-CM

## 2021-11-21 DIAGNOSIS — Z992 Dependence on renal dialysis: Secondary | ICD-10-CM

## 2021-11-21 DIAGNOSIS — I350 Nonrheumatic aortic (valve) stenosis: Secondary | ICD-10-CM

## 2021-11-21 DIAGNOSIS — I10 Essential (primary) hypertension: Secondary | ICD-10-CM | POA: Diagnosis not present

## 2021-11-21 DIAGNOSIS — N186 End stage renal disease: Secondary | ICD-10-CM

## 2021-11-21 DIAGNOSIS — R0989 Other specified symptoms and signs involving the circulatory and respiratory systems: Secondary | ICD-10-CM | POA: Diagnosis not present

## 2021-11-21 DIAGNOSIS — Z794 Long term (current) use of insulin: Secondary | ICD-10-CM

## 2021-11-21 DIAGNOSIS — L03119 Cellulitis of unspecified part of limb: Secondary | ICD-10-CM | POA: Diagnosis not present

## 2021-11-21 DIAGNOSIS — I129 Hypertensive chronic kidney disease with stage 1 through stage 4 chronic kidney disease, or unspecified chronic kidney disease: Secondary | ICD-10-CM | POA: Diagnosis not present

## 2021-11-21 MED ORDER — AMLODIPINE BESYLATE 10 MG PO TABS: 10.0000 mg | ORAL_TABLET | Freq: Every day | ORAL | 3 refills | Status: DC

## 2021-11-21 NOTE — Addendum Note (Signed)
Addended by: Jacobo Forest D on: 11/21/2021 09:25 AM   Modules accepted: Orders

## 2021-11-21 NOTE — Progress Notes (Signed)
Cardiology Office Note:    Date:  11/21/2021   ID:  Justin [name], Justin [name] January 19, 1950, MRN [id number]  PCP:  Reynold Bowen, MD  Cardiologist:  Jenne Campus, MD    Referring MD: Reynold Bowen, MD   Chief Complaint  Patient presents with   Hypertension    History of Present Illness:    Justin [name] is a 71 y.o. male with past medical history significant for end-stage kidney failure.  He is on hemodialysis, coronary artery disease last assessment with cardiac catheterization April 28, 2020 with 25% proximal 20% distal LAD, diagonal 120%, ramus 10%, RCA 45 mean 75 proximal focal stenosis of the long PDA however small posterolateral branch 60 to 70%.  Medical therapy has been recommended, also essential hypertension seems to be difficult to control, diabetes, dyslipidemia. He comes today 2 months for follow-up overall doing well.  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  He still able to walk climb stairs with no difficulties.  Denies have any chest pain tightness squeezing pressure burning chest no palpitations dizziness swelling of lower extremities  Past Medical History:  Diagnosis Date   BPH (benign prostatic hyperplasia)    Chronic kidney disease (CKD), stage IV (severe) (HCC)    Diabetes (HCC)    Dysuria    Erectile dysfunction    Hyperlipemia    Hypertension    Pinna disorder, left    Renal disorder    Wears glasses     Past Surgical History:  Procedure Laterality Date   AV FISTULA PLACEMENT Left 04/30/2018   Procedure: CREATION OF RADIOCEPHALIC ARTERIOVENOUS FISTULA LEFT ARM;  Surgeon: Elam Dutch, MD;  Location: [facility] OR;  Service: Vascular;  Laterality: Left;   AV FISTULA PLACEMENT Left 08/28/2018   Procedure: ARTERIOVENOUS (AV) BRACHIOCEPHALIC FISTULA CREATION LEFT UPPER ARM;  Surgeon: Marty Heck, MD;  Location: [facility] OR;  Service: Vascular;  Laterality: Left;   IR FLUORO GUIDE CV LINE RIGHT  04/29/2018   IR US GUIDE VASC ACCESS RIGHT  04/29/2018    RIGHT/LEFT HEART CATH AND CORONARY ANGIOGRAPHY N/A 05/01/2018   Procedure: RIGHT/LEFT HEART CATH AND CORONARY ANGIOGRAPHY;  Surgeon: Belva Crome, MD;  Location: [place] CV LAB;  Service: Cardiovascular;  Laterality: N/A;   subdural hematoma Right 07/2020    Current Medications: Current Meds  Medication Sig   amLODipine (NORVASC) 5 MG tablet Take 5 mg by mouth daily.   aspirin EC 81 MG tablet Take 81 mg by mouth daily. Swallow whole.   calcium acetate (PHOSLO) 667 MG capsule Take 2,668 mg by mouth daily.   hydrALAZINE (APRESOLINE) 25 MG tablet Take 25 mg by mouth 3 (three) times daily.   isosorbide mononitrate (IMDUR) 30 MG 24 hr tablet TAKE 1 TABLET BY MOUTH IN THE MORNING ON  NON  DIALYSIS  DAYS (Patient taking differently: Take 30 mg by mouth daily. TAKE 1 TABLET BY MOUTH IN THE MORNING ON  NON  DIALYSIS  DAYS)   metoprolol succinate (TOPROL-XL) 50 MG 24 hr tablet Take 50 mg by mouth daily. Take 2 tablets 2xs a day mon,wed,fri,sun   nitroGLYCERIN (NITROSTAT) 0.4 MG SL tablet Place 1 tablet (0.4 mg total) under the tongue every 5 (five) minutes as needed. (Patient taking differently: Place 0.4 mg under the tongue every 5 (five) minutes as needed for chest pain.)   NOVOLIN 70/30 RELION (70-30) 100 UNIT/ML injection Inject 20 Units into the skin daily. (Patient taking differently: Inject 20 Units into the skin See admin instructions.  Inject 20 units subcutaneously with breakfast on Sunday, Monday, Wednesday, Friday; inject 20 units with lunch on Tuesday, Thursday, Saturday (dialysis days))   rosuvastatin (CRESTOR) 10 MG tablet Take 40 mg by mouth daily.     Allergies:   Patient has no known allergies.   Social History   Socioeconomic History   Marital status: Divorced    Spouse name: Not on file   Number of children: 1   Years of education: Not on file   Highest education level: Not on file  Occupational History   Not on file  Tobacco Use   Smoking status: Never   Smokeless  tobacco: Never  Vaping Use   Vaping Use: Never used  Substance and Sexual Activity   Alcohol use: Not Currently   Drug use: Never   Sexual activity: Not Currently  Other Topics Concern   Not on file  Social History Narrative   Not on file   Social Determinants of Health   Financial Resource Strain: Not on file  Food Insecurity: No Food Insecurity (08/03/2021)   Hunger Vital Sign    Worried About Running Out of Food in the Last Year: Never true    Ran Out of Food in the Last Year: Never true  Transportation Needs: No Transportation Needs (08/03/2021)   PRAPARE - Hydrologist (Medical): No    Lack of Transportation (Non-Medical): No  Physical Activity: Not on file  Stress: Not on file  Social Connections: Not on file     Family History: The patient's family history includes Diabetes in his father; Stroke in his mother. ROS:   Please see the history of present illness.    All 14 point review of systems negative except as described per history of present illness  EKGs/Labs/Other Studies Reviewed:      Recent Labs: No results found for requested labs within last 365 days.  Recent Lipid Panel    Component Value Date/Time   CHOL 109 04/25/2018 0429   TRIG 73 04/25/2018 0429   HDL 34 (L) 04/25/2018 0429   CHOLHDL 3.2 04/25/2018 0429   VLDL 15 04/25/2018 0429   LDLCALC 60 04/25/2018 0429   LDLDIRECT 32 06/05/2019 1639    Physical Exam:    VS:  BP (!) 188/60 (BP Location: Left Arm, Patient Position: Sitting)   Pulse 60   Ht 5\' 9"  (1.753 m)   Wt 180 lb 6.4 oz (81.8 kg)   SpO2 98%   BMI 26.64 kg/m     Wt Readings from Last 3 Encounters:  11/21/21 180 lb 6.4 oz (81.8 kg)  09/07/21 182 lb 9.6 oz (82.8 kg)  03/02/21 184 lb (83.5 kg)     GEN:  Well nourished, well developed in no acute distress HEENT: Normal NECK: No JVD; No carotid bruits LYMPHATICS: No lymphadenopathy CARDIAC: RRR, no murmurs, no rubs, no gallops RESPIRATORY:  Clear  to auscultation without rales, wheezing or rhonchi  ABDOMEN: Soft, non-tender, non-distended MUSCULOSKELETAL:  No edema; No deformity  SKIN: Warm and dry LOWER EXTREMITIES: no swelling NEUROLOGIC:  Alert and oriented x 3 PSYCHIATRIC:  Normal affect   ASSESSMENT:    1. Coronary artery disease involving native coronary artery of native heart without angina pectoris   2. Primary hypertension   3. Nonrheumatic aortic valve stenosis   4. Type 2 diabetes mellitus with chronic kidney disease on chronic dialysis, with long-term current use of insulin ([place])   5. Hypertensive chronic kidney disease with stage 1  through stage 4 chronic kidney disease, or unspecified chronic kidney disease    PLAN:    In order of problems listed above:  Coronary disease stable from that point review and appropriate medication which include antiplatelets therapy which I will continue. Essential high tension uncontrolled.  I will increase dose of amlodipine to 10 mg daily neck step will be to increase the dose of hydralazine, however, I do not want to do things at once.  I asked his wife to bring blood pressure measurements to me in about a week and based on that we will decide what to do next. Nonrheumatic aortic valve stenosis mild to moderate.  I am surprised that he was rejected this candidate for kidney transplant because of that stenosis.  He is completely asymptomatic, obviously will watch progression of this problem but for now he does not require any intervention Kidney failure, he is on dialysis continue   Medication Adjustments/Labs and Tests Ordered: Current medicines are reviewed at length with the patient today.  Concerns regarding medicines are outlined above.  No orders of the defined types were placed in this encounter.  Medication changes: No orders of the defined types were placed in this encounter.   Signed, Park Liter, MD, [facility] 11/21/2021 9:09 AM    [place]

## 2021-11-21 NOTE — Patient Instructions (Addendum)
Medication Instructions:  Your physician has recommended you make the following change in your medication:   INCREASE: Amlodipine to 10mg  daily- You may double your current dose and your next refill will reflect your new dose.    Lab Work: None Ordered If you have labs (blood work) drawn today and your tests are completely normal, you will receive your results only by: [place] (if you have MyChart) OR A paper copy in the mail If you have any lab test that is abnormal or we need to change your treatment, we will call you to review the results.   Testing/Procedures: Your physician has requested that you have a carotid duplex. This test is an ultrasound of the carotid arteries in your neck. It looks at blood flow through these arteries that supply the brain with blood. Allow one hour for this exam. There are no restrictions or special instructions.    Follow-Up: At [facility], you and your health needs are our priority.  As part of our continuing mission to provide you with exceptional heart care, we have created designated Provider Care Teams.  These Care Teams include your primary Cardiologist (physician) and Advanced Practice Providers (APPs -  Physician Assistants and Nurse Practitioners) who all work together to provide you with the care you need, when you need it.  We recommend signing up for the patient portal called "MyChart".  Sign up information is provided on this After Visit Summary.  MyChart is used to connect with patients for Virtual Visits (Telemedicine).  Patients are able to view lab/test results, encounter notes, upcoming appointments, etc.  Non-urgent messages can be sent to your provider as well.   To learn more about what you can do with MyChart, go to [email or web address].    Your next appointment:   3 month(s)  The format for your next appointment:   In Person  Provider:   Jenne Campus, MD    Other Instructions NA    Blood Pressure Record  Sheet To take your blood pressure, you will need a blood pressure machine. You can buy a blood pressure machine (blood pressure monitor) at your clinic, drug store, or online. When choosing one, consider: An automatic monitor that has an arm cuff. A cuff that wraps snugly around your upper arm. You should be able to fit only one finger between your arm and the cuff. A device that stores blood pressure reading results. Do not choose a monitor that measures your blood pressure from your wrist or finger. Follow your health care provider's instructions for how to take your blood pressure. To use this form: Get one reading in the morning (a.m.) 1-2 hours after you take any medicines. Get one reading in the evening (p.m.) before supper. Take at least 2 readings with each blood pressure check. This makes sure the results are correct. Wait 1-2 minutes between measurements. Write down the results in the spaces on this form. Repeat this once a week, or as told by your health care provider.  Make a follow-up appointment with your health care provider to discuss the results. Blood pressure log Date: _______________________ a.m. _____________________(1st reading) HR___________            p.m. _____________________(2nd reading) HR__________  Date: _______________________ a.m. _____________________(1st reading) HR___________            p.m. _____________________(2nd reading) HR__________ Date: _______________________ a.m. _____________________(1st reading) HR___________            p.m. _____________________(2nd reading) HR__________ Date: _______________________ a.m. _____________________(1st  reading) HR___________            p.m. _____________________(2nd reading) HR__________  Date: _______________________ a.m. _____________________(1st reading) HR___________            p.m. _____________________(2nd reading) HR__________  Date: _______________________ a.m. _____________________(1st  reading) HR___________            p.m. _____________________(2nd reading) HR__________  Date: _______________________ a.m. _____________________(1st reading) HR___________            p.m. _____________________(2nd reading) HR__________   This information is not intended to replace advice given to you by your health care provider. Make sure you discuss any questions you have with your health care provider. Document Revised: 04/09/2019 Document Reviewed: 04/09/2019 Elsevier Patient Education  2021 [employer].

## 2021-11-22 DIAGNOSIS — E876 Hypokalemia: Secondary | ICD-10-CM | POA: Insufficient documentation

## 2021-11-22 DIAGNOSIS — N2581 Secondary hyperparathyroidism of renal origin: Secondary | ICD-10-CM | POA: Diagnosis not present

## 2021-11-22 DIAGNOSIS — Z992 Dependence on renal dialysis: Secondary | ICD-10-CM | POA: Diagnosis not present

## 2021-11-22 DIAGNOSIS — N186 End stage renal disease: Secondary | ICD-10-CM | POA: Diagnosis not present

## 2021-11-24 DIAGNOSIS — N2581 Secondary hyperparathyroidism of renal origin: Secondary | ICD-10-CM | POA: Diagnosis not present

## 2021-11-24 DIAGNOSIS — Z992 Dependence on renal dialysis: Secondary | ICD-10-CM | POA: Diagnosis not present

## 2021-11-24 DIAGNOSIS — N186 End stage renal disease: Secondary | ICD-10-CM | POA: Diagnosis not present

## 2021-11-26 DIAGNOSIS — Z992 Dependence on renal dialysis: Secondary | ICD-10-CM | POA: Diagnosis not present

## 2021-11-26 DIAGNOSIS — N2581 Secondary hyperparathyroidism of renal origin: Secondary | ICD-10-CM | POA: Diagnosis not present

## 2021-11-26 DIAGNOSIS — N186 End stage renal disease: Secondary | ICD-10-CM | POA: Diagnosis not present

## 2021-11-29 DIAGNOSIS — N186 End stage renal disease: Secondary | ICD-10-CM | POA: Diagnosis not present

## 2021-11-29 DIAGNOSIS — N2581 Secondary hyperparathyroidism of renal origin: Secondary | ICD-10-CM | POA: Diagnosis not present

## 2021-11-29 DIAGNOSIS — Z992 Dependence on renal dialysis: Secondary | ICD-10-CM | POA: Diagnosis not present

## 2021-12-01 DIAGNOSIS — Z992 Dependence on renal dialysis: Secondary | ICD-10-CM | POA: Diagnosis not present

## 2021-12-01 DIAGNOSIS — N186 End stage renal disease: Secondary | ICD-10-CM | POA: Diagnosis not present

## 2021-12-01 DIAGNOSIS — N2581 Secondary hyperparathyroidism of renal origin: Secondary | ICD-10-CM | POA: Diagnosis not present

## 2021-12-01 DIAGNOSIS — E1129 Type 2 diabetes mellitus with other diabetic kidney complication: Secondary | ICD-10-CM | POA: Diagnosis not present

## 2021-12-03 ENCOUNTER — Encounter: Payer: Self-pay | Admitting: Cardiology

## 2021-12-03 DIAGNOSIS — Z992 Dependence on renal dialysis: Secondary | ICD-10-CM | POA: Diagnosis not present

## 2021-12-03 DIAGNOSIS — N2581 Secondary hyperparathyroidism of renal origin: Secondary | ICD-10-CM | POA: Diagnosis not present

## 2021-12-03 DIAGNOSIS — N186 End stage renal disease: Secondary | ICD-10-CM | POA: Diagnosis not present

## 2021-12-08 DIAGNOSIS — N2581 Secondary hyperparathyroidism of renal origin: Secondary | ICD-10-CM | POA: Diagnosis not present

## 2021-12-08 DIAGNOSIS — N186 End stage renal disease: Secondary | ICD-10-CM | POA: Diagnosis not present

## 2021-12-08 DIAGNOSIS — Z992 Dependence on renal dialysis: Secondary | ICD-10-CM | POA: Diagnosis not present

## 2021-12-10 DIAGNOSIS — N186 End stage renal disease: Secondary | ICD-10-CM | POA: Diagnosis not present

## 2021-12-10 DIAGNOSIS — Z992 Dependence on renal dialysis: Secondary | ICD-10-CM | POA: Diagnosis not present

## 2021-12-10 DIAGNOSIS — N2581 Secondary hyperparathyroidism of renal origin: Secondary | ICD-10-CM | POA: Diagnosis not present

## 2021-12-13 DIAGNOSIS — N186 End stage renal disease: Secondary | ICD-10-CM | POA: Diagnosis not present

## 2021-12-13 DIAGNOSIS — Z992 Dependence on renal dialysis: Secondary | ICD-10-CM | POA: Diagnosis not present

## 2021-12-13 DIAGNOSIS — N2581 Secondary hyperparathyroidism of renal origin: Secondary | ICD-10-CM | POA: Diagnosis not present

## 2021-12-14 ENCOUNTER — Ambulatory Visit: Payer: Medicare HMO | Attending: Cardiology

## 2021-12-14 DIAGNOSIS — R0989 Other specified symptoms and signs involving the circulatory and respiratory systems: Secondary | ICD-10-CM | POA: Diagnosis not present

## 2021-12-15 DIAGNOSIS — N2581 Secondary hyperparathyroidism of renal origin: Secondary | ICD-10-CM | POA: Diagnosis not present

## 2021-12-15 DIAGNOSIS — N186 End stage renal disease: Secondary | ICD-10-CM | POA: Diagnosis not present

## 2021-12-15 DIAGNOSIS — Z992 Dependence on renal dialysis: Secondary | ICD-10-CM | POA: Diagnosis not present

## 2021-12-17 DIAGNOSIS — Z992 Dependence on renal dialysis: Secondary | ICD-10-CM | POA: Diagnosis not present

## 2021-12-17 DIAGNOSIS — N2581 Secondary hyperparathyroidism of renal origin: Secondary | ICD-10-CM | POA: Diagnosis not present

## 2021-12-17 DIAGNOSIS — N186 End stage renal disease: Secondary | ICD-10-CM | POA: Diagnosis not present

## 2021-12-20 DIAGNOSIS — N2581 Secondary hyperparathyroidism of renal origin: Secondary | ICD-10-CM | POA: Diagnosis not present

## 2021-12-20 DIAGNOSIS — Z992 Dependence on renal dialysis: Secondary | ICD-10-CM | POA: Diagnosis not present

## 2021-12-20 DIAGNOSIS — N186 End stage renal disease: Secondary | ICD-10-CM | POA: Diagnosis not present

## 2021-12-21 NOTE — Telephone Encounter (Signed)
Spoke with Kathy-POA- per Dr. Wendy Poet note regarding Carotid Artery results. She ask if Dr. Agustin Cree feels he should find another facility regarding his transplant

## 2021-12-22 DIAGNOSIS — N186 End stage renal disease: Secondary | ICD-10-CM | POA: Diagnosis not present

## 2021-12-22 DIAGNOSIS — N2581 Secondary hyperparathyroidism of renal origin: Secondary | ICD-10-CM | POA: Diagnosis not present

## 2021-12-22 DIAGNOSIS — Z992 Dependence on renal dialysis: Secondary | ICD-10-CM | POA: Diagnosis not present

## 2021-12-27 DIAGNOSIS — N186 End stage renal disease: Secondary | ICD-10-CM | POA: Diagnosis not present

## 2021-12-27 DIAGNOSIS — Z992 Dependence on renal dialysis: Secondary | ICD-10-CM | POA: Diagnosis not present

## 2021-12-27 DIAGNOSIS — N2581 Secondary hyperparathyroidism of renal origin: Secondary | ICD-10-CM | POA: Diagnosis not present

## 2021-12-29 DIAGNOSIS — N2581 Secondary hyperparathyroidism of renal origin: Secondary | ICD-10-CM | POA: Diagnosis not present

## 2021-12-29 DIAGNOSIS — N186 End stage renal disease: Secondary | ICD-10-CM | POA: Diagnosis not present

## 2021-12-29 DIAGNOSIS — Z992 Dependence on renal dialysis: Secondary | ICD-10-CM | POA: Diagnosis not present

## 2021-12-31 DIAGNOSIS — Z992 Dependence on renal dialysis: Secondary | ICD-10-CM | POA: Diagnosis not present

## 2021-12-31 DIAGNOSIS — N2581 Secondary hyperparathyroidism of renal origin: Secondary | ICD-10-CM | POA: Diagnosis not present

## 2021-12-31 DIAGNOSIS — N186 End stage renal disease: Secondary | ICD-10-CM | POA: Diagnosis not present

## 2022-01-01 DIAGNOSIS — E1129 Type 2 diabetes mellitus with other diabetic kidney complication: Secondary | ICD-10-CM | POA: Diagnosis not present

## 2022-01-01 DIAGNOSIS — N186 End stage renal disease: Secondary | ICD-10-CM | POA: Diagnosis not present

## 2022-01-01 DIAGNOSIS — Z992 Dependence on renal dialysis: Secondary | ICD-10-CM | POA: Diagnosis not present

## 2022-01-03 DIAGNOSIS — D631 Anemia in chronic kidney disease: Secondary | ICD-10-CM | POA: Diagnosis not present

## 2022-01-03 DIAGNOSIS — N2581 Secondary hyperparathyroidism of renal origin: Secondary | ICD-10-CM | POA: Diagnosis not present

## 2022-01-03 DIAGNOSIS — E785 Hyperlipidemia, unspecified: Secondary | ICD-10-CM | POA: Diagnosis not present

## 2022-01-03 DIAGNOSIS — E538 Deficiency of other specified B group vitamins: Secondary | ICD-10-CM | POA: Diagnosis not present

## 2022-01-03 DIAGNOSIS — Z794 Long term (current) use of insulin: Secondary | ICD-10-CM | POA: Diagnosis not present

## 2022-01-03 DIAGNOSIS — N186 End stage renal disease: Secondary | ICD-10-CM | POA: Diagnosis not present

## 2022-01-03 DIAGNOSIS — I10 Essential (primary) hypertension: Secondary | ICD-10-CM | POA: Diagnosis not present

## 2022-01-03 DIAGNOSIS — Z992 Dependence on renal dialysis: Secondary | ICD-10-CM | POA: Diagnosis not present

## 2022-01-03 DIAGNOSIS — E1129 Type 2 diabetes mellitus with other diabetic kidney complication: Secondary | ICD-10-CM | POA: Diagnosis not present

## 2022-01-03 DIAGNOSIS — I2510 Atherosclerotic heart disease of native coronary artery without angina pectoris: Secondary | ICD-10-CM | POA: Diagnosis not present

## 2022-01-03 DIAGNOSIS — N401 Enlarged prostate with lower urinary tract symptoms: Secondary | ICD-10-CM | POA: Diagnosis not present

## 2022-01-03 DIAGNOSIS — I6523 Occlusion and stenosis of bilateral carotid arteries: Secondary | ICD-10-CM | POA: Diagnosis not present

## 2022-01-05 DIAGNOSIS — N2581 Secondary hyperparathyroidism of renal origin: Secondary | ICD-10-CM | POA: Diagnosis not present

## 2022-01-05 DIAGNOSIS — N186 End stage renal disease: Secondary | ICD-10-CM | POA: Diagnosis not present

## 2022-01-05 DIAGNOSIS — Z992 Dependence on renal dialysis: Secondary | ICD-10-CM | POA: Diagnosis not present

## 2022-01-10 DIAGNOSIS — N186 End stage renal disease: Secondary | ICD-10-CM | POA: Diagnosis not present

## 2022-01-10 DIAGNOSIS — Z992 Dependence on renal dialysis: Secondary | ICD-10-CM | POA: Diagnosis not present

## 2022-01-10 DIAGNOSIS — N2581 Secondary hyperparathyroidism of renal origin: Secondary | ICD-10-CM | POA: Diagnosis not present

## 2022-01-12 DIAGNOSIS — Z992 Dependence on renal dialysis: Secondary | ICD-10-CM | POA: Diagnosis not present

## 2022-01-12 DIAGNOSIS — N2581 Secondary hyperparathyroidism of renal origin: Secondary | ICD-10-CM | POA: Diagnosis not present

## 2022-01-12 DIAGNOSIS — N186 End stage renal disease: Secondary | ICD-10-CM | POA: Diagnosis not present

## 2022-01-14 DIAGNOSIS — N2581 Secondary hyperparathyroidism of renal origin: Secondary | ICD-10-CM | POA: Diagnosis not present

## 2022-01-14 DIAGNOSIS — Z992 Dependence on renal dialysis: Secondary | ICD-10-CM | POA: Diagnosis not present

## 2022-01-14 DIAGNOSIS — N186 End stage renal disease: Secondary | ICD-10-CM | POA: Diagnosis not present

## 2022-01-17 DIAGNOSIS — Z992 Dependence on renal dialysis: Secondary | ICD-10-CM | POA: Diagnosis not present

## 2022-01-17 DIAGNOSIS — N2581 Secondary hyperparathyroidism of renal origin: Secondary | ICD-10-CM | POA: Diagnosis not present

## 2022-01-17 DIAGNOSIS — N186 End stage renal disease: Secondary | ICD-10-CM | POA: Diagnosis not present

## 2022-01-19 DIAGNOSIS — Z992 Dependence on renal dialysis: Secondary | ICD-10-CM | POA: Diagnosis not present

## 2022-01-19 DIAGNOSIS — N186 End stage renal disease: Secondary | ICD-10-CM | POA: Diagnosis not present

## 2022-01-19 DIAGNOSIS — N2581 Secondary hyperparathyroidism of renal origin: Secondary | ICD-10-CM | POA: Diagnosis not present

## 2022-01-21 DIAGNOSIS — N2581 Secondary hyperparathyroidism of renal origin: Secondary | ICD-10-CM | POA: Diagnosis not present

## 2022-01-21 DIAGNOSIS — Z992 Dependence on renal dialysis: Secondary | ICD-10-CM | POA: Diagnosis not present

## 2022-01-21 DIAGNOSIS — N186 End stage renal disease: Secondary | ICD-10-CM | POA: Diagnosis not present

## 2022-01-24 DIAGNOSIS — N2581 Secondary hyperparathyroidism of renal origin: Secondary | ICD-10-CM | POA: Diagnosis not present

## 2022-01-24 DIAGNOSIS — Z992 Dependence on renal dialysis: Secondary | ICD-10-CM | POA: Diagnosis not present

## 2022-01-24 DIAGNOSIS — N186 End stage renal disease: Secondary | ICD-10-CM | POA: Diagnosis not present

## 2022-01-26 DIAGNOSIS — N186 End stage renal disease: Secondary | ICD-10-CM | POA: Diagnosis not present

## 2022-01-26 DIAGNOSIS — Z992 Dependence on renal dialysis: Secondary | ICD-10-CM | POA: Diagnosis not present

## 2022-01-26 DIAGNOSIS — N2581 Secondary hyperparathyroidism of renal origin: Secondary | ICD-10-CM | POA: Diagnosis not present

## 2022-01-28 DIAGNOSIS — Z992 Dependence on renal dialysis: Secondary | ICD-10-CM | POA: Diagnosis not present

## 2022-01-28 DIAGNOSIS — N2581 Secondary hyperparathyroidism of renal origin: Secondary | ICD-10-CM | POA: Diagnosis not present

## 2022-01-28 DIAGNOSIS — N186 End stage renal disease: Secondary | ICD-10-CM | POA: Diagnosis not present

## 2022-01-31 DIAGNOSIS — N186 End stage renal disease: Secondary | ICD-10-CM | POA: Diagnosis not present

## 2022-01-31 DIAGNOSIS — Z992 Dependence on renal dialysis: Secondary | ICD-10-CM | POA: Diagnosis not present

## 2022-01-31 DIAGNOSIS — N2581 Secondary hyperparathyroidism of renal origin: Secondary | ICD-10-CM | POA: Diagnosis not present

## 2022-02-01 DIAGNOSIS — E1129 Type 2 diabetes mellitus with other diabetic kidney complication: Secondary | ICD-10-CM | POA: Diagnosis not present

## 2022-02-01 DIAGNOSIS — Z992 Dependence on renal dialysis: Secondary | ICD-10-CM | POA: Diagnosis not present

## 2022-02-01 DIAGNOSIS — N186 End stage renal disease: Secondary | ICD-10-CM | POA: Diagnosis not present

## 2022-02-02 DIAGNOSIS — N186 End stage renal disease: Secondary | ICD-10-CM | POA: Diagnosis not present

## 2022-02-02 DIAGNOSIS — N2581 Secondary hyperparathyroidism of renal origin: Secondary | ICD-10-CM | POA: Diagnosis not present

## 2022-02-02 DIAGNOSIS — Z992 Dependence on renal dialysis: Secondary | ICD-10-CM | POA: Diagnosis not present

## 2022-02-07 DIAGNOSIS — Z992 Dependence on renal dialysis: Secondary | ICD-10-CM | POA: Diagnosis not present

## 2022-02-07 DIAGNOSIS — N186 End stage renal disease: Secondary | ICD-10-CM | POA: Diagnosis not present

## 2022-02-07 DIAGNOSIS — N2581 Secondary hyperparathyroidism of renal origin: Secondary | ICD-10-CM | POA: Diagnosis not present

## 2022-02-08 DIAGNOSIS — T82858A Stenosis of vascular prosthetic devices, implants and grafts, initial encounter: Secondary | ICD-10-CM | POA: Diagnosis not present

## 2022-02-08 DIAGNOSIS — Z992 Dependence on renal dialysis: Secondary | ICD-10-CM | POA: Diagnosis not present

## 2022-02-08 DIAGNOSIS — N186 End stage renal disease: Secondary | ICD-10-CM | POA: Diagnosis not present

## 2022-02-09 DIAGNOSIS — N2581 Secondary hyperparathyroidism of renal origin: Secondary | ICD-10-CM | POA: Diagnosis not present

## 2022-02-09 DIAGNOSIS — Z992 Dependence on renal dialysis: Secondary | ICD-10-CM | POA: Diagnosis not present

## 2022-02-09 DIAGNOSIS — N186 End stage renal disease: Secondary | ICD-10-CM | POA: Diagnosis not present

## 2022-02-11 DIAGNOSIS — N186 End stage renal disease: Secondary | ICD-10-CM | POA: Diagnosis not present

## 2022-02-11 DIAGNOSIS — N2581 Secondary hyperparathyroidism of renal origin: Secondary | ICD-10-CM | POA: Diagnosis not present

## 2022-02-11 DIAGNOSIS — Z992 Dependence on renal dialysis: Secondary | ICD-10-CM | POA: Diagnosis not present

## 2022-02-14 DIAGNOSIS — N2581 Secondary hyperparathyroidism of renal origin: Secondary | ICD-10-CM | POA: Diagnosis not present

## 2022-02-14 DIAGNOSIS — Z992 Dependence on renal dialysis: Secondary | ICD-10-CM | POA: Diagnosis not present

## 2022-02-14 DIAGNOSIS — N186 End stage renal disease: Secondary | ICD-10-CM | POA: Diagnosis not present

## 2022-02-16 DIAGNOSIS — N2581 Secondary hyperparathyroidism of renal origin: Secondary | ICD-10-CM | POA: Diagnosis not present

## 2022-02-16 DIAGNOSIS — N186 End stage renal disease: Secondary | ICD-10-CM | POA: Diagnosis not present

## 2022-02-16 DIAGNOSIS — Z992 Dependence on renal dialysis: Secondary | ICD-10-CM | POA: Diagnosis not present

## 2022-02-18 DIAGNOSIS — N186 End stage renal disease: Secondary | ICD-10-CM | POA: Diagnosis not present

## 2022-02-18 DIAGNOSIS — N2581 Secondary hyperparathyroidism of renal origin: Secondary | ICD-10-CM | POA: Diagnosis not present

## 2022-02-18 DIAGNOSIS — Z992 Dependence on renal dialysis: Secondary | ICD-10-CM | POA: Diagnosis not present

## 2022-02-21 DIAGNOSIS — N2581 Secondary hyperparathyroidism of renal origin: Secondary | ICD-10-CM | POA: Diagnosis not present

## 2022-02-21 DIAGNOSIS — N186 End stage renal disease: Secondary | ICD-10-CM | POA: Diagnosis not present

## 2022-02-21 DIAGNOSIS — Z992 Dependence on renal dialysis: Secondary | ICD-10-CM | POA: Diagnosis not present

## 2022-02-23 DIAGNOSIS — Z992 Dependence on renal dialysis: Secondary | ICD-10-CM | POA: Diagnosis not present

## 2022-02-23 DIAGNOSIS — N186 End stage renal disease: Secondary | ICD-10-CM | POA: Diagnosis not present

## 2022-02-23 DIAGNOSIS — N2581 Secondary hyperparathyroidism of renal origin: Secondary | ICD-10-CM | POA: Diagnosis not present

## 2022-02-25 DIAGNOSIS — N186 End stage renal disease: Secondary | ICD-10-CM | POA: Diagnosis not present

## 2022-02-25 DIAGNOSIS — N2581 Secondary hyperparathyroidism of renal origin: Secondary | ICD-10-CM | POA: Diagnosis not present

## 2022-02-25 DIAGNOSIS — Z992 Dependence on renal dialysis: Secondary | ICD-10-CM | POA: Diagnosis not present

## 2022-02-28 DIAGNOSIS — N2581 Secondary hyperparathyroidism of renal origin: Secondary | ICD-10-CM | POA: Diagnosis not present

## 2022-02-28 DIAGNOSIS — N186 End stage renal disease: Secondary | ICD-10-CM | POA: Diagnosis not present

## 2022-02-28 DIAGNOSIS — Z992 Dependence on renal dialysis: Secondary | ICD-10-CM | POA: Diagnosis not present

## 2022-03-02 DIAGNOSIS — N186 End stage renal disease: Secondary | ICD-10-CM | POA: Diagnosis not present

## 2022-03-02 DIAGNOSIS — E1129 Type 2 diabetes mellitus with other diabetic kidney complication: Secondary | ICD-10-CM | POA: Diagnosis not present

## 2022-03-02 DIAGNOSIS — Z992 Dependence on renal dialysis: Secondary | ICD-10-CM | POA: Diagnosis not present

## 2022-03-02 DIAGNOSIS — N2581 Secondary hyperparathyroidism of renal origin: Secondary | ICD-10-CM | POA: Diagnosis not present

## 2022-03-04 DIAGNOSIS — N186 End stage renal disease: Secondary | ICD-10-CM | POA: Diagnosis not present

## 2022-03-04 DIAGNOSIS — N2581 Secondary hyperparathyroidism of renal origin: Secondary | ICD-10-CM | POA: Diagnosis not present

## 2022-03-04 DIAGNOSIS — Z992 Dependence on renal dialysis: Secondary | ICD-10-CM | POA: Diagnosis not present

## 2022-03-07 DIAGNOSIS — N186 End stage renal disease: Secondary | ICD-10-CM | POA: Diagnosis not present

## 2022-03-07 DIAGNOSIS — N2581 Secondary hyperparathyroidism of renal origin: Secondary | ICD-10-CM | POA: Diagnosis not present

## 2022-03-07 DIAGNOSIS — Z992 Dependence on renal dialysis: Secondary | ICD-10-CM | POA: Diagnosis not present

## 2022-03-11 DIAGNOSIS — Z992 Dependence on renal dialysis: Secondary | ICD-10-CM | POA: Diagnosis not present

## 2022-03-11 DIAGNOSIS — N186 End stage renal disease: Secondary | ICD-10-CM | POA: Diagnosis not present

## 2022-03-11 DIAGNOSIS — N2581 Secondary hyperparathyroidism of renal origin: Secondary | ICD-10-CM | POA: Diagnosis not present

## 2022-03-14 DIAGNOSIS — N186 End stage renal disease: Secondary | ICD-10-CM | POA: Diagnosis not present

## 2022-03-14 DIAGNOSIS — Z992 Dependence on renal dialysis: Secondary | ICD-10-CM | POA: Diagnosis not present

## 2022-03-14 DIAGNOSIS — N2581 Secondary hyperparathyroidism of renal origin: Secondary | ICD-10-CM | POA: Diagnosis not present

## 2022-03-15 ENCOUNTER — Ambulatory Visit: Payer: Medicare HMO | Admitting: Cardiology

## 2022-03-16 DIAGNOSIS — N2581 Secondary hyperparathyroidism of renal origin: Secondary | ICD-10-CM | POA: Diagnosis not present

## 2022-03-16 DIAGNOSIS — Z992 Dependence on renal dialysis: Secondary | ICD-10-CM | POA: Diagnosis not present

## 2022-03-16 DIAGNOSIS — N186 End stage renal disease: Secondary | ICD-10-CM | POA: Diagnosis not present

## 2022-03-18 DIAGNOSIS — N2581 Secondary hyperparathyroidism of renal origin: Secondary | ICD-10-CM | POA: Diagnosis not present

## 2022-03-18 DIAGNOSIS — N186 End stage renal disease: Secondary | ICD-10-CM | POA: Diagnosis not present

## 2022-03-18 DIAGNOSIS — Z992 Dependence on renal dialysis: Secondary | ICD-10-CM | POA: Diagnosis not present

## 2022-03-21 DIAGNOSIS — Z992 Dependence on renal dialysis: Secondary | ICD-10-CM | POA: Diagnosis not present

## 2022-03-21 DIAGNOSIS — N186 End stage renal disease: Secondary | ICD-10-CM | POA: Diagnosis not present

## 2022-03-21 DIAGNOSIS — N2581 Secondary hyperparathyroidism of renal origin: Secondary | ICD-10-CM | POA: Diagnosis not present

## 2022-03-23 DIAGNOSIS — Z992 Dependence on renal dialysis: Secondary | ICD-10-CM | POA: Diagnosis not present

## 2022-03-23 DIAGNOSIS — N2581 Secondary hyperparathyroidism of renal origin: Secondary | ICD-10-CM | POA: Diagnosis not present

## 2022-03-23 DIAGNOSIS — N186 End stage renal disease: Secondary | ICD-10-CM | POA: Diagnosis not present

## 2022-03-25 DIAGNOSIS — N186 End stage renal disease: Secondary | ICD-10-CM | POA: Diagnosis not present

## 2022-03-25 DIAGNOSIS — Z992 Dependence on renal dialysis: Secondary | ICD-10-CM | POA: Diagnosis not present

## 2022-03-25 DIAGNOSIS — N2581 Secondary hyperparathyroidism of renal origin: Secondary | ICD-10-CM | POA: Diagnosis not present

## 2022-03-28 DIAGNOSIS — N186 End stage renal disease: Secondary | ICD-10-CM | POA: Diagnosis not present

## 2022-03-28 DIAGNOSIS — N2581 Secondary hyperparathyroidism of renal origin: Secondary | ICD-10-CM | POA: Diagnosis not present

## 2022-03-28 DIAGNOSIS — Z992 Dependence on renal dialysis: Secondary | ICD-10-CM | POA: Diagnosis not present

## 2022-03-29 ENCOUNTER — Ambulatory Visit: Payer: Medicare HMO | Attending: Cardiology | Admitting: Cardiology

## 2022-03-29 ENCOUNTER — Encounter: Payer: Self-pay | Admitting: Cardiology

## 2022-03-29 VITALS — BP 120/50 | HR 56 | Ht 69.0 in | Wt 178.0 lb

## 2022-03-29 DIAGNOSIS — I10 Essential (primary) hypertension: Secondary | ICD-10-CM

## 2022-03-29 DIAGNOSIS — I350 Nonrheumatic aortic (valve) stenosis: Secondary | ICD-10-CM | POA: Diagnosis not present

## 2022-03-29 DIAGNOSIS — E1122 Type 2 diabetes mellitus with diabetic chronic kidney disease: Secondary | ICD-10-CM | POA: Diagnosis not present

## 2022-03-29 DIAGNOSIS — Z794 Long term (current) use of insulin: Secondary | ICD-10-CM | POA: Diagnosis not present

## 2022-03-29 DIAGNOSIS — Z992 Dependence on renal dialysis: Secondary | ICD-10-CM

## 2022-03-29 DIAGNOSIS — I2510 Atherosclerotic heart disease of native coronary artery without angina pectoris: Secondary | ICD-10-CM

## 2022-03-29 DIAGNOSIS — N186 End stage renal disease: Secondary | ICD-10-CM | POA: Diagnosis not present

## 2022-03-29 NOTE — Patient Instructions (Signed)
Medication Instructions:  Your physician recommends that you continue on your current medications as directed. Please refer to the Current Medication list given to you today.  *If you need a refill on your cardiac medications before your next appointment, please call your pharmacy*   Lab Work: None Ordered If you have labs (blood work) drawn today and your tests are completely normal, you will receive your results only by: [place] (if you have MyChart) OR A paper copy in the mail If you have any lab test that is abnormal or we need to change your treatment, we will call you to review the results.   Testing/Procedures: 2 weeks prior to 6 mo follow up Your physician has requested that you have an echocardiogram. Echocardiography is a painless test that uses sound waves to create images of your heart. It provides your doctor with information about the size and shape of your heart and how well your heart's chambers and valves are working. This procedure takes approximately one hour. There are no restrictions for this procedure. Please do NOT wear cologne, perfume, aftershave, or lotions (deodorant is allowed). Please arrive 15 minutes prior to your appointment time.    Follow-Up: At [facility], you and your health needs are our priority.  As part of our continuing mission to provide you with exceptional heart care, we have created designated Provider Care Teams.  These Care Teams include your primary Cardiologist (physician) and Advanced Practice Providers (APPs -  Physician Assistants and Nurse Practitioners) who all work together to provide you with the care you need, when you need it.  We recommend signing up for the patient portal called "MyChart".  Sign up information is provided on this After Visit Summary.  MyChart is used to connect with patients for Virtual Visits (Telemedicine).  Patients are able to view lab/test results, encounter notes, upcoming appointments, etc.   Non-urgent messages can be sent to your provider as well.   To learn more about what you can do with MyChart, go to [email or web address].    Your next appointment:   6 month(s)  The format for your next appointment:   In Person  Provider:   Jenne Campus, MD    Other Instructions NA

## 2022-03-29 NOTE — Progress Notes (Signed)
Cardiology Office Note:    Date:  03/29/2022   ID:  Justin [name], Justin [name] 05-Jan-1950, MRN [id number]  PCP:  Reynold Bowen, MD  Cardiologist:  Jenne Campus, MD    Referring MD: Reynold Bowen, MD   No chief complaint on file. Doing fine  History of Present Illness:    Justin [name] is a 72 y.o. male past medical history significant for end-stage kidney disease, he is on hemodialysis, coronary artery disease with last assessment recent cardiac catheterization done in April 28, 2020 with 25% of proximal, 20% distal LAD, diagonal branch 20% stenosis, ramus 10% stenosis, RCA mid 45% stenosis right PDA had 75% stenosis, second posterior lateral branch had 70% stenosis.  Additional problem include diabetes, hypertension which seems to be difficult to control, dyslipidemia, aortic stenosis which is mild to moderate. He is coming today to months for follow-up overall cardiac wise doing well, denies have any chest pain tightness squeezing pressure mid chest no shortness of breath no dizziness no passing out.  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  Past Medical History:  Diagnosis Date   BPH (benign prostatic hyperplasia)    Chronic kidney disease (CKD), stage IV (severe) (HCC)    Diabetes (HCC)    Dysuria    Erectile dysfunction    Hyperlipemia    Hypertension    Pinna disorder, left    Renal disorder    Wears glasses     Past Surgical History:  Procedure Laterality Date   AV FISTULA PLACEMENT Left 04/30/2018   Procedure: CREATION OF RADIOCEPHALIC ARTERIOVENOUS FISTULA LEFT ARM;  Surgeon: Elam Dutch, MD;  Location[facility] OR;  Service: Vascular;  Laterality: Left;   AV FISTULA PLACEMENT Left 08/28/2018   Procedure: ARTERIOVENOUS (AV) BRACHIOCEPHALIC FISTULA CREATION LEFT UPPER ARM;  Surgeon: Marty Heck, MD;  Location: [facility] OR;  Service: Vascular;  Laterality: Left;   IR FLUORO GUIDE CV LINE RIGHT  04/29/2018    IR US GUIDE VASC ACCESS RIGHT  04/29/2018   RIGHT/LEFT HEART CATH AND CORONARY ANGIOGRAPHY N/A 05/01/2018   Procedure: RIGHT/LEFT HEART CATH AND CORONARY ANGIOGRAPHY;  Surgeon: Belva Crome, MD;  Location: [place] CV LAB;  Service: Cardiovascular;  Laterality: N/A;   subdural hematoma Right 07/2020    Current Medications: Current Meds  Medication Sig   amLODipine (NORVASC) 10 MG tablet Take 1 tablet (10 mg total) by mouth daily.   aspirin EC 81 MG tablet Take 81 mg by mouth daily. Swallow whole.   calcium acetate (PHOSLO) 667 MG capsule Take 2,668 mg by mouth daily.   hydrALAZINE (APRESOLINE) 25 MG tablet Take 25 mg by mouth 3 (three) times daily.   isosorbide mononitrate (IMDUR) 30 MG 24 hr tablet TAKE 1 TABLET BY MOUTH IN THE MORNING ON  NON  DIALYSIS  DAYS   metoprolol succinate (TOPROL-XL) 50 MG 24 hr tablet Take 50 mg by mouth daily. Take 2 tablets 2xs a day mon,wed,fri,sun   nitroGLYCERIN (NITROSTAT) 0.4 MG SL tablet Place 0.4 mg under the tongue every 5 (five) minutes as needed for chest pain.   NOVOLIN 70/30 RELION (70-30) 100 UNIT/ML injection Inject 20 Units into the skin daily. (Patient taking differently: Inject 20 Units into the skin See admin instructions. Inject 20 units subcutaneously with breakfast on Sunday, Monday, Wednesday, Friday; inject 20 units with lunch on Tuesday, Thursday, Saturday (dialysis days))   rosuvastatin (CRESTOR) 40 MG tablet Take 40  mg by mouth daily.   [DISCONTINUED] rosuvastatin (CRESTOR) 10 MG tablet Take 40 mg by mouth daily.     Allergies:   Patient has no known allergies.   Social History   Socioeconomic History   Marital status: Divorced    Spouse name: Not on file   Number of children: 1   Years of education: Not on file   Highest education level: Not on file  Occupational History   Not on file  Tobacco Use   Smoking status: Never   Smokeless tobacco: Never  Vaping Use   Vaping Use: Never used  Substance and Sexual Activity    Alcohol use: Not Currently   Drug use: Never   Sexual activity: Not Currently  Other Topics Concern   Not on file  Social History Narrative   Not on file   Social Determinants of Health   Financial Resource Strain: Not on file  Food Insecurity: No Food Insecurity (08/03/2021)   Hunger Vital Sign    Worried About Running Out of Food in the Last Year: Never true    Ran Out of Food in the Last Year: Never true  Transportation Needs: No Transportation Needs (08/03/2021)   PRAPARE - Hydrologist (Medical): No    Lack of Transportation (Non-Medical): No  Physical Activity: Not on file  Stress: Not on file  Social Connections: Not on file     Family History: The patient's family history includes Diabetes in his father; Stroke in his mother. ROS:   Please see the history of present illness.    All 14 point review of systems negative except as described per history of present illness  EKGs/Labs/Other Studies Reviewed:      Recent Labs: No results found for requested labs within last 365 days.  Recent Lipid Panel    Component Value Date/Time   CHOL 109 04/25/2018 0429   TRIG 73 04/25/2018 0429   HDL 34 (L) 04/25/2018 0429   CHOLHDL 3.2 04/25/2018 0429   VLDL 15 04/25/2018 0429   LDLCALC 60 04/25/2018 0429   LDLDIRECT 32 06/05/2019 1639    Physical Exam:    VS:  BP (!) 120/50 (BP Location: Right Arm, Patient Position: Sitting, Cuff Size: Normal)   Pulse (!) 56   Ht 5\' 9"  (1.753 m)   Wt 178 lb (80.7 kg)   SpO2 97%   BMI 26.29 kg/m     Wt Readings from Last 3 Encounters:  03/29/22 178 lb (80.7 kg)  11/21/21 180 lb 6.4 oz (81.8 kg)  09/07/21 182 lb 9.6 oz (82.8 kg)     GEN:  Well nourished, well developed in no acute distress HEENT: Normal NECK: No JVD; No carotid bruits LYMPHATICS: No lymphadenopathy CARDIAC: RRR, systolic ejection murmur grade 2/6 to 3/6 best heard right upper portion of the sternum, S2 is still crisp and without, no  rubs, no gallops RESPIRATORY:  Clear to auscultation without rales, wheezing or rhonchi  ABDOMEN: Soft, non-tender, non-distended MUSCULOSKELETAL:  No edema; No deformity  SKIN: Warm and dry LOWER EXTREMITIES: no swelling NEUROLOGIC:  Alert and oriented x 3 PSYCHIATRIC:  Normal affect   ASSESSMENT:    1. Coronary artery disease involving native coronary artery of native heart without angina pectoris   2. Type 2 diabetes mellitus with chronic kidney disease on chronic dialysis, with long-term current use of insulin ([place])   3. Nonrheumatic aortic valve stenosis   4. Primary hypertension   5. ESRD on hemodialysis (  [place])    PLAN:    In order of problems listed above:  Coronary disease stable from that point review doing well continue present management. Type 2 diabetes that being followed by antimedicine team, hemoglobin A1c 5.9 in January good control continue present management. Essential hypertension blood pressure well-controlled continue present management. Aortic stenosis not critical, will repeat echocardiogram in about 6 months. End-stage renal disease on hemodialysis.  Apparently did not qualify for candidate for kidney transplant   Medication Adjustments/Labs and Tests Ordered: Current medicines are reviewed at length with the patient today.  Concerns regarding medicines are outlined above.  No orders of the defined types were placed in this encounter.  Medication changes: No orders of the defined types were placed in this encounter.   Signed, Park Liter, MD, [facility] 03/29/2022 11:46 AM    [place]

## 2022-03-30 DIAGNOSIS — Z992 Dependence on renal dialysis: Secondary | ICD-10-CM | POA: Diagnosis not present

## 2022-03-30 DIAGNOSIS — N2581 Secondary hyperparathyroidism of renal origin: Secondary | ICD-10-CM | POA: Diagnosis not present

## 2022-03-30 DIAGNOSIS — N186 End stage renal disease: Secondary | ICD-10-CM | POA: Diagnosis not present

## 2022-03-31 ENCOUNTER — Ambulatory Visit: Payer: Medicare HMO | Admitting: Cardiology

## 2022-04-01 DIAGNOSIS — Z992 Dependence on renal dialysis: Secondary | ICD-10-CM | POA: Diagnosis not present

## 2022-04-01 DIAGNOSIS — N2581 Secondary hyperparathyroidism of renal origin: Secondary | ICD-10-CM | POA: Diagnosis not present

## 2022-04-01 DIAGNOSIS — N186 End stage renal disease: Secondary | ICD-10-CM | POA: Diagnosis not present

## 2022-04-02 DIAGNOSIS — Z992 Dependence on renal dialysis: Secondary | ICD-10-CM | POA: Diagnosis not present

## 2022-04-02 DIAGNOSIS — E1129 Type 2 diabetes mellitus with other diabetic kidney complication: Secondary | ICD-10-CM | POA: Diagnosis not present

## 2022-04-02 DIAGNOSIS — N186 End stage renal disease: Secondary | ICD-10-CM | POA: Diagnosis not present

## 2022-04-04 DIAGNOSIS — N186 End stage renal disease: Secondary | ICD-10-CM | POA: Diagnosis not present

## 2022-04-04 DIAGNOSIS — Z992 Dependence on renal dialysis: Secondary | ICD-10-CM | POA: Diagnosis not present

## 2022-04-04 DIAGNOSIS — N2581 Secondary hyperparathyroidism of renal origin: Secondary | ICD-10-CM | POA: Diagnosis not present

## 2022-04-06 DIAGNOSIS — N2581 Secondary hyperparathyroidism of renal origin: Secondary | ICD-10-CM | POA: Diagnosis not present

## 2022-04-06 DIAGNOSIS — Z992 Dependence on renal dialysis: Secondary | ICD-10-CM | POA: Diagnosis not present

## 2022-04-06 DIAGNOSIS — N186 End stage renal disease: Secondary | ICD-10-CM | POA: Diagnosis not present

## 2022-04-08 DIAGNOSIS — Z992 Dependence on renal dialysis: Secondary | ICD-10-CM | POA: Diagnosis not present

## 2022-04-08 DIAGNOSIS — N2581 Secondary hyperparathyroidism of renal origin: Secondary | ICD-10-CM | POA: Diagnosis not present

## 2022-04-08 DIAGNOSIS — N186 End stage renal disease: Secondary | ICD-10-CM | POA: Diagnosis not present

## 2022-04-11 DIAGNOSIS — N186 End stage renal disease: Secondary | ICD-10-CM | POA: Diagnosis not present

## 2022-04-11 DIAGNOSIS — N2581 Secondary hyperparathyroidism of renal origin: Secondary | ICD-10-CM | POA: Diagnosis not present

## 2022-04-11 DIAGNOSIS — Z992 Dependence on renal dialysis: Secondary | ICD-10-CM | POA: Diagnosis not present

## 2022-04-12 ENCOUNTER — Ambulatory Visit: Payer: Medicare HMO | Attending: Cardiology

## 2022-04-12 DIAGNOSIS — I350 Nonrheumatic aortic (valve) stenosis: Secondary | ICD-10-CM | POA: Diagnosis not present

## 2022-04-12 LAB — ECHOCARDIOGRAM COMPLETE
AR max vel: 1.09 cm2
AV Area VTI: 1.11 cm2

## 2022-04-13 LAB — ECHOCARDIOGRAM COMPLETE
AV Area mean vel: 1.04 cm2
AV Mean grad: 15.3 mmHg
AV Peak grad: 26.5 mmHg
Ao pk vel: 2.58 m/s
P 1/2 time: 545 msec
S' Lateral: 3.3 cm

## 2022-04-15 DIAGNOSIS — N186 End stage renal disease: Secondary | ICD-10-CM | POA: Diagnosis not present

## 2022-04-15 DIAGNOSIS — Z992 Dependence on renal dialysis: Secondary | ICD-10-CM | POA: Diagnosis not present

## 2022-04-15 DIAGNOSIS — N2581 Secondary hyperparathyroidism of renal origin: Secondary | ICD-10-CM | POA: Diagnosis not present

## 2022-04-18 ENCOUNTER — Other Ambulatory Visit (HOSPITAL_BASED_OUTPATIENT_CLINIC_OR_DEPARTMENT_OTHER): Payer: Self-pay | Admitting: Cardiology

## 2022-04-18 DIAGNOSIS — I25118 Atherosclerotic heart disease of native coronary artery with other forms of angina pectoris: Secondary | ICD-10-CM

## 2022-04-18 DIAGNOSIS — Z992 Dependence on renal dialysis: Secondary | ICD-10-CM | POA: Diagnosis not present

## 2022-04-18 DIAGNOSIS — N2581 Secondary hyperparathyroidism of renal origin: Secondary | ICD-10-CM | POA: Diagnosis not present

## 2022-04-18 DIAGNOSIS — I350 Nonrheumatic aortic (valve) stenosis: Secondary | ICD-10-CM

## 2022-04-18 DIAGNOSIS — N186 End stage renal disease: Secondary | ICD-10-CM | POA: Diagnosis not present

## 2022-04-18 DIAGNOSIS — I10 Essential (primary) hypertension: Secondary | ICD-10-CM

## 2022-04-20 DIAGNOSIS — Z992 Dependence on renal dialysis: Secondary | ICD-10-CM | POA: Diagnosis not present

## 2022-04-20 DIAGNOSIS — N2581 Secondary hyperparathyroidism of renal origin: Secondary | ICD-10-CM | POA: Diagnosis not present

## 2022-04-20 DIAGNOSIS — N186 End stage renal disease: Secondary | ICD-10-CM | POA: Diagnosis not present

## 2022-04-22 DIAGNOSIS — Z992 Dependence on renal dialysis: Secondary | ICD-10-CM | POA: Diagnosis not present

## 2022-04-22 DIAGNOSIS — N186 End stage renal disease: Secondary | ICD-10-CM | POA: Diagnosis not present

## 2022-04-22 DIAGNOSIS — N2581 Secondary hyperparathyroidism of renal origin: Secondary | ICD-10-CM | POA: Diagnosis not present

## 2022-04-25 DIAGNOSIS — Z992 Dependence on renal dialysis: Secondary | ICD-10-CM | POA: Diagnosis not present

## 2022-04-25 DIAGNOSIS — N2581 Secondary hyperparathyroidism of renal origin: Secondary | ICD-10-CM | POA: Diagnosis not present

## 2022-04-25 DIAGNOSIS — N186 End stage renal disease: Secondary | ICD-10-CM | POA: Diagnosis not present

## 2022-04-27 DIAGNOSIS — N186 End stage renal disease: Secondary | ICD-10-CM | POA: Diagnosis not present

## 2022-04-27 DIAGNOSIS — Z992 Dependence on renal dialysis: Secondary | ICD-10-CM | POA: Diagnosis not present

## 2022-04-27 DIAGNOSIS — N2581 Secondary hyperparathyroidism of renal origin: Secondary | ICD-10-CM | POA: Diagnosis not present

## 2022-04-29 DIAGNOSIS — N186 End stage renal disease: Secondary | ICD-10-CM | POA: Diagnosis not present

## 2022-04-29 DIAGNOSIS — Z992 Dependence on renal dialysis: Secondary | ICD-10-CM | POA: Diagnosis not present

## 2022-04-29 DIAGNOSIS — N2581 Secondary hyperparathyroidism of renal origin: Secondary | ICD-10-CM | POA: Diagnosis not present

## 2022-05-02 DIAGNOSIS — Z992 Dependence on renal dialysis: Secondary | ICD-10-CM | POA: Diagnosis not present

## 2022-05-02 DIAGNOSIS — N2581 Secondary hyperparathyroidism of renal origin: Secondary | ICD-10-CM | POA: Diagnosis not present

## 2022-05-02 DIAGNOSIS — N186 End stage renal disease: Secondary | ICD-10-CM | POA: Diagnosis not present

## 2022-05-02 DIAGNOSIS — E1129 Type 2 diabetes mellitus with other diabetic kidney complication: Secondary | ICD-10-CM | POA: Diagnosis not present

## 2022-05-04 DIAGNOSIS — Z992 Dependence on renal dialysis: Secondary | ICD-10-CM | POA: Diagnosis not present

## 2022-05-04 DIAGNOSIS — N2581 Secondary hyperparathyroidism of renal origin: Secondary | ICD-10-CM | POA: Diagnosis not present

## 2022-05-04 DIAGNOSIS — N186 End stage renal disease: Secondary | ICD-10-CM | POA: Diagnosis not present

## 2022-05-06 DIAGNOSIS — N186 End stage renal disease: Secondary | ICD-10-CM | POA: Diagnosis not present

## 2022-05-06 DIAGNOSIS — Z992 Dependence on renal dialysis: Secondary | ICD-10-CM | POA: Diagnosis not present

## 2022-05-06 DIAGNOSIS — N2581 Secondary hyperparathyroidism of renal origin: Secondary | ICD-10-CM | POA: Diagnosis not present

## 2022-05-09 DIAGNOSIS — Z992 Dependence on renal dialysis: Secondary | ICD-10-CM | POA: Diagnosis not present

## 2022-05-09 DIAGNOSIS — N2581 Secondary hyperparathyroidism of renal origin: Secondary | ICD-10-CM | POA: Diagnosis not present

## 2022-05-09 DIAGNOSIS — N186 End stage renal disease: Secondary | ICD-10-CM | POA: Diagnosis not present

## 2022-05-11 DIAGNOSIS — Z992 Dependence on renal dialysis: Secondary | ICD-10-CM | POA: Diagnosis not present

## 2022-05-11 DIAGNOSIS — N2581 Secondary hyperparathyroidism of renal origin: Secondary | ICD-10-CM | POA: Diagnosis not present

## 2022-05-11 DIAGNOSIS — N186 End stage renal disease: Secondary | ICD-10-CM | POA: Diagnosis not present

## 2022-05-13 DIAGNOSIS — N186 End stage renal disease: Secondary | ICD-10-CM | POA: Diagnosis not present

## 2022-05-13 DIAGNOSIS — Z992 Dependence on renal dialysis: Secondary | ICD-10-CM | POA: Diagnosis not present

## 2022-05-13 DIAGNOSIS — N2581 Secondary hyperparathyroidism of renal origin: Secondary | ICD-10-CM | POA: Diagnosis not present

## 2022-05-16 DIAGNOSIS — N2581 Secondary hyperparathyroidism of renal origin: Secondary | ICD-10-CM | POA: Diagnosis not present

## 2022-05-16 DIAGNOSIS — N186 End stage renal disease: Secondary | ICD-10-CM | POA: Diagnosis not present

## 2022-05-16 DIAGNOSIS — Z992 Dependence on renal dialysis: Secondary | ICD-10-CM | POA: Diagnosis not present

## 2022-05-18 DIAGNOSIS — Z992 Dependence on renal dialysis: Secondary | ICD-10-CM | POA: Diagnosis not present

## 2022-05-18 DIAGNOSIS — N2581 Secondary hyperparathyroidism of renal origin: Secondary | ICD-10-CM | POA: Diagnosis not present

## 2022-05-18 DIAGNOSIS — N186 End stage renal disease: Secondary | ICD-10-CM | POA: Diagnosis not present

## 2022-05-23 DIAGNOSIS — N2581 Secondary hyperparathyroidism of renal origin: Secondary | ICD-10-CM | POA: Diagnosis not present

## 2022-05-23 DIAGNOSIS — Z992 Dependence on renal dialysis: Secondary | ICD-10-CM | POA: Diagnosis not present

## 2022-05-23 DIAGNOSIS — N186 End stage renal disease: Secondary | ICD-10-CM | POA: Diagnosis not present

## 2022-05-25 DIAGNOSIS — Z992 Dependence on renal dialysis: Secondary | ICD-10-CM | POA: Diagnosis not present

## 2022-05-25 DIAGNOSIS — N186 End stage renal disease: Secondary | ICD-10-CM | POA: Diagnosis not present

## 2022-05-25 DIAGNOSIS — N2581 Secondary hyperparathyroidism of renal origin: Secondary | ICD-10-CM | POA: Diagnosis not present

## 2022-05-27 DIAGNOSIS — Z992 Dependence on renal dialysis: Secondary | ICD-10-CM | POA: Diagnosis not present

## 2022-05-27 DIAGNOSIS — N186 End stage renal disease: Secondary | ICD-10-CM | POA: Diagnosis not present

## 2022-05-27 DIAGNOSIS — N2581 Secondary hyperparathyroidism of renal origin: Secondary | ICD-10-CM | POA: Diagnosis not present

## 2022-05-30 DIAGNOSIS — N186 End stage renal disease: Secondary | ICD-10-CM | POA: Diagnosis not present

## 2022-05-30 DIAGNOSIS — N2581 Secondary hyperparathyroidism of renal origin: Secondary | ICD-10-CM | POA: Diagnosis not present

## 2022-05-30 DIAGNOSIS — Z992 Dependence on renal dialysis: Secondary | ICD-10-CM | POA: Diagnosis not present

## 2022-06-01 DIAGNOSIS — Z992 Dependence on renal dialysis: Secondary | ICD-10-CM | POA: Diagnosis not present

## 2022-06-01 DIAGNOSIS — N186 End stage renal disease: Secondary | ICD-10-CM | POA: Diagnosis not present

## 2022-06-01 DIAGNOSIS — N2581 Secondary hyperparathyroidism of renal origin: Secondary | ICD-10-CM | POA: Diagnosis not present

## 2022-06-02 DIAGNOSIS — Z992 Dependence on renal dialysis: Secondary | ICD-10-CM | POA: Diagnosis not present

## 2022-06-02 DIAGNOSIS — N186 End stage renal disease: Secondary | ICD-10-CM | POA: Diagnosis not present

## 2022-06-02 DIAGNOSIS — E1129 Type 2 diabetes mellitus with other diabetic kidney complication: Secondary | ICD-10-CM | POA: Diagnosis not present

## 2022-06-03 DIAGNOSIS — N2581 Secondary hyperparathyroidism of renal origin: Secondary | ICD-10-CM | POA: Diagnosis not present

## 2022-06-03 DIAGNOSIS — Z992 Dependence on renal dialysis: Secondary | ICD-10-CM | POA: Diagnosis not present

## 2022-06-03 DIAGNOSIS — N186 End stage renal disease: Secondary | ICD-10-CM | POA: Diagnosis not present

## 2022-06-05 ENCOUNTER — Emergency Department (HOSPITAL_COMMUNITY): Payer: Medicare HMO

## 2022-06-05 ENCOUNTER — Encounter (HOSPITAL_COMMUNITY): Payer: Self-pay | Admitting: *Deleted

## 2022-06-05 ENCOUNTER — Other Ambulatory Visit: Payer: Self-pay

## 2022-06-05 ENCOUNTER — Inpatient Hospital Stay (HOSPITAL_COMMUNITY)
Admission: EM | Admit: 2022-06-05 | Discharge: 2022-06-08 | DRG: 640 | Disposition: A | Discharge status: Home / Self Care | Payer: Medicare HMO | Attending: Internal Medicine | Admitting: Internal Medicine

## 2022-06-05 DIAGNOSIS — M898X9 Other specified disorders of bone, unspecified site: Secondary | ICD-10-CM | POA: Diagnosis present

## 2022-06-05 DIAGNOSIS — E1122 Type 2 diabetes mellitus with diabetic chronic kidney disease: Secondary | ICD-10-CM | POA: Diagnosis present

## 2022-06-05 DIAGNOSIS — E785 Hyperlipidemia, unspecified: Secondary | ICD-10-CM | POA: Diagnosis present

## 2022-06-05 DIAGNOSIS — R059 Cough, unspecified: Secondary | ICD-10-CM | POA: Diagnosis not present

## 2022-06-05 DIAGNOSIS — I5032 Chronic diastolic (congestive) heart failure: Secondary | ICD-10-CM | POA: Diagnosis present

## 2022-06-05 DIAGNOSIS — Z8249 Family history of ischemic heart disease and other diseases of the circulatory system: Secondary | ICD-10-CM | POA: Diagnosis not present

## 2022-06-05 DIAGNOSIS — Z823 Family history of stroke: Secondary | ICD-10-CM

## 2022-06-05 DIAGNOSIS — Z833 Family history of diabetes mellitus: Secondary | ICD-10-CM

## 2022-06-05 DIAGNOSIS — I132 Hypertensive heart and chronic kidney disease with heart failure and with stage 5 chronic kidney disease, or end stage renal disease: Secondary | ICD-10-CM | POA: Diagnosis present

## 2022-06-05 DIAGNOSIS — R0902 Hypoxemia: Secondary | ICD-10-CM | POA: Diagnosis not present

## 2022-06-05 DIAGNOSIS — I120 Hypertensive chronic kidney disease with stage 5 chronic kidney disease or end stage renal disease: Secondary | ICD-10-CM | POA: Diagnosis not present

## 2022-06-05 DIAGNOSIS — D631 Anemia in chronic kidney disease: Secondary | ICD-10-CM | POA: Diagnosis present

## 2022-06-05 DIAGNOSIS — Z1152 Encounter for screening for COVID-19: Secondary | ICD-10-CM | POA: Diagnosis not present

## 2022-06-05 DIAGNOSIS — R0600 Dyspnea, unspecified: Secondary | ICD-10-CM

## 2022-06-05 DIAGNOSIS — Z992 Dependence on renal dialysis: Secondary | ICD-10-CM

## 2022-06-05 DIAGNOSIS — R634 Abnormal weight loss: Secondary | ICD-10-CM | POA: Diagnosis present

## 2022-06-05 DIAGNOSIS — Z7982 Long term (current) use of aspirin: Secondary | ICD-10-CM | POA: Diagnosis not present

## 2022-06-05 DIAGNOSIS — D649 Anemia, unspecified: Secondary | ICD-10-CM | POA: Diagnosis not present

## 2022-06-05 DIAGNOSIS — Z6823 Body mass index (BMI) 23.0-23.9, adult: Secondary | ICD-10-CM

## 2022-06-05 DIAGNOSIS — E8770 Fluid overload, unspecified: Secondary | ICD-10-CM | POA: Diagnosis present

## 2022-06-05 DIAGNOSIS — I10 Essential (primary) hypertension: Secondary | ICD-10-CM | POA: Diagnosis present

## 2022-06-05 DIAGNOSIS — Z79899 Other long term (current) drug therapy: Secondary | ICD-10-CM

## 2022-06-05 DIAGNOSIS — I5030 Unspecified diastolic (congestive) heart failure: Secondary | ICD-10-CM | POA: Diagnosis not present

## 2022-06-05 DIAGNOSIS — D62 Acute posthemorrhagic anemia: Secondary | ICD-10-CM | POA: Diagnosis present

## 2022-06-05 DIAGNOSIS — R0602 Shortness of breath: Secondary | ICD-10-CM | POA: Diagnosis not present

## 2022-06-05 DIAGNOSIS — N250 Renal osteodystrophy: Secondary | ICD-10-CM | POA: Diagnosis not present

## 2022-06-05 DIAGNOSIS — N186 End stage renal disease: Secondary | ICD-10-CM | POA: Diagnosis present

## 2022-06-05 DIAGNOSIS — J9601 Acute respiratory failure with hypoxia: Secondary | ICD-10-CM | POA: Diagnosis present

## 2022-06-05 DIAGNOSIS — I5020 Unspecified systolic (congestive) heart failure: Secondary | ICD-10-CM | POA: Diagnosis not present

## 2022-06-05 DIAGNOSIS — Z794 Long term (current) use of insulin: Secondary | ICD-10-CM | POA: Diagnosis not present

## 2022-06-05 DIAGNOSIS — N189 Chronic kidney disease, unspecified: Secondary | ICD-10-CM | POA: Diagnosis present

## 2022-06-05 DIAGNOSIS — I3139 Other pericardial effusion (noninflammatory): Secondary | ICD-10-CM | POA: Diagnosis present

## 2022-06-05 LAB — I-STAT VENOUS BLOOD GAS, ED
Acid-Base Excess: 4 mmol/L — ABNORMAL HIGH (ref 0.0–2.0)
Bicarbonate: 28 mmol/L (ref 20.0–28.0)
Calcium, Ion: 1.04 mmol/L — ABNORMAL LOW (ref 1.15–1.40)
HCT: 23 % — ABNORMAL LOW (ref 39.0–52.0)
Hemoglobin: 7.8 g/dL — ABNORMAL LOW (ref 13.0–17.0)
O2 Saturation: 89 %
Potassium: 3.9 mmol/L (ref 3.5–5.1)
Sodium: 134 mmol/L — ABNORMAL LOW (ref 135–145)
TCO2: 29 mmol/L (ref 22–32)
pCO2, Ven: 40.6 mmHg — ABNORMAL LOW (ref 44–60)
pH, Ven: 7.446 — ABNORMAL HIGH (ref 7.25–7.43)
pO2, Ven: 54 mmHg — ABNORMAL HIGH (ref 32–45)

## 2022-06-05 LAB — COMPREHENSIVE METABOLIC PANEL
ALT: 27 U/L (ref 0–44)
AST: 22 U/L (ref 15–41)
Albumin: 3.2 g/dL — ABNORMAL LOW (ref 3.5–5.0)
Alkaline Phosphatase: 67 U/L (ref 38–126)
Anion gap: 18 — ABNORMAL HIGH (ref 5–15)
BUN: 47 mg/dL — ABNORMAL HIGH (ref 8–23)
CO2: 27 mmol/L (ref 22–32)
Calcium: 8.8 mg/dL — ABNORMAL LOW (ref 8.9–10.3)
Chloride: 92 mmol/L — ABNORMAL LOW (ref 98–111)
Creatinine, Ser: 8.97 mg/dL — ABNORMAL HIGH (ref 0.61–1.24)
GFR, Estimated: 6 mL/min — ABNORMAL LOW (ref >60)
Glucose, Bld: 192 mg/dL — ABNORMAL HIGH (ref 70–99)
Potassium: 3.9 mmol/L (ref 3.5–5.1)
Sodium: 137 mmol/L (ref 135–145)
Total Bilirubin: 1.6 mg/dL — ABNORMAL HIGH (ref 0.3–1.2)
Total Protein: 6.5 g/dL (ref 6.5–8.1)

## 2022-06-05 LAB — CBC WITH DIFFERENTIAL/PLATELET
Abs Immature Granulocytes: 0.04 10*3/uL (ref 0.00–0.07)
Basophils Absolute: 0 10*3/uL (ref 0.0–0.1)
Basophils Relative: 0 %
Eosinophils Absolute: 0 10*3/uL (ref 0.0–0.5)
Eosinophils Relative: 0 %
HCT: 22.9 % — ABNORMAL LOW (ref 39.0–52.0)
Hemoglobin: 7.7 g/dL — ABNORMAL LOW (ref 13.0–17.0)
Immature Granulocytes: 1 %
Lymphocytes Relative: 7 %
Lymphs Abs: 0.5 10*3/uL — ABNORMAL LOW (ref 0.7–4.0)
MCH: 29.8 pg (ref 26.0–34.0)
MCHC: 33.6 g/dL (ref 30.0–36.0)
MCV: 88.8 fL (ref 80.0–100.0)
Monocytes Absolute: 0.8 10*3/uL (ref 0.1–1.0)
Monocytes Relative: 9 %
Neutro Abs: 6.7 10*3/uL (ref 1.7–7.7)
Neutrophils Relative %: 83 %
Platelets: 181 10*3/uL (ref 150–400)
RBC: 2.58 MIL/uL — ABNORMAL LOW (ref 4.22–5.81)
RDW: 14.3 % (ref 11.5–15.5)
WBC: 8.1 10*3/uL (ref 4.0–10.5)
nRBC: 0 % (ref 0.0–0.2)

## 2022-06-05 LAB — I-STAT CHEM 8, ED
BUN: 47 mg/dL — ABNORMAL HIGH (ref 8–23)
Calcium, Ion: 1.05 mmol/L — ABNORMAL LOW (ref 1.15–1.40)
Chloride: 93 mmol/L — ABNORMAL LOW (ref 98–111)
Creatinine, Ser: 10.1 mg/dL — ABNORMAL HIGH (ref 0.61–1.24)
Glucose, Bld: 192 mg/dL — ABNORMAL HIGH (ref 70–99)
HCT: 22 % — ABNORMAL LOW (ref 39.0–52.0)
Hemoglobin: 7.5 g/dL — ABNORMAL LOW (ref 13.0–17.0)
Potassium: 3.9 mmol/L (ref 3.5–5.1)
Sodium: 134 mmol/L — ABNORMAL LOW (ref 135–145)
TCO2: 28 mmol/L (ref 22–32)

## 2022-06-05 LAB — BRAIN NATRIURETIC PEPTIDE: B Natriuretic Peptide: 1835.7 pg/mL — ABNORMAL HIGH (ref 0.0–100.0)

## 2022-06-05 LAB — RESP PANEL BY RT-PCR (RSV, FLU A&B, COVID)  RVPGX2
Influenza A by PCR: NEGATIVE
Influenza B by PCR: NEGATIVE
Resp Syncytial Virus by PCR: NEGATIVE
SARS Coronavirus 2 by RT PCR: NEGATIVE

## 2022-06-05 LAB — TROPONIN I (HIGH SENSITIVITY)
Troponin I (High Sensitivity): 53 ng/L — ABNORMAL HIGH (ref <18)
Troponin I (High Sensitivity): 54 ng/L — ABNORMAL HIGH (ref <18)

## 2022-06-05 LAB — GLUCOSE, CAPILLARY
Glucose-Capillary: 190 mg/dL — ABNORMAL HIGH (ref 70–99)
Glucose-Capillary: 158 mg/dL — ABNORMAL HIGH (ref 70–99)

## 2022-06-05 LAB — HEPATITIS B SURFACE ANTIGEN: Hepatitis B Surface Ag: NONREACTIVE

## 2022-06-05 LAB — LACTIC ACID, PLASMA
Lactic Acid, Venous: 1.2 mmol/L (ref 0.5–1.9)
Lactic Acid, Venous: 1.1 mmol/L (ref 0.5–1.9)

## 2022-06-05 MED ORDER — HYDRALAZINE HCL 25 MG PO TABS: 25.0000 mg | ORAL_TABLET | Freq: Three times a day (TID) | ORAL | Status: DC

## 2022-06-05 MED ORDER — POLYETHYLENE GLYCOL 3350 17 G PO PACK: 17.0000 g | PACK | Freq: Every day | ORAL | Status: DC | PRN

## 2022-06-05 MED ORDER — INSULIN ASPART PROT & ASPART (70-30 MIX) 100 UNIT/ML ~~LOC~~ SUSP
20.0000 [IU] | Freq: Every day | SUBCUTANEOUS | Status: DC
  Administered 2022-06-05 – 2022-06-07 (×3): 20 [IU] via SUBCUTANEOUS
  Filled 2022-06-05: qty 10

## 2022-06-05 MED ORDER — FERRIC CITRATE 1 GM 210 MG(FE) PO TABS
420.0000 mg | ORAL_TABLET | Freq: Three times a day (TID) | ORAL | Status: DC
  Administered 2022-06-05 – 2022-06-08 (×7): 420 mg via ORAL
  Filled 2022-06-05 (×11): qty 2

## 2022-06-05 MED ORDER — AMLODIPINE BESYLATE 10 MG PO TABS
10.0000 mg | ORAL_TABLET | Freq: Every day | ORAL | Status: DC
  Administered 2022-06-05 – 2022-06-08 (×3): 10 mg via ORAL
  Filled 2022-06-05 (×3): qty 1

## 2022-06-05 MED ORDER — INSULIN ASPART 100 UNIT/ML IJ SOLN
0.0000 [IU] | Freq: Three times a day (TID) | INTRAMUSCULAR | Status: DC
  Administered 2022-06-05: 1 [IU] via SUBCUTANEOUS
  Administered 2022-06-06: 2 [IU] via SUBCUTANEOUS
  Administered 2022-06-07: 1 [IU] via SUBCUTANEOUS
  Administered 2022-06-08: 2 [IU] via SUBCUTANEOUS

## 2022-06-05 MED ORDER — ONDANSETRON HCL 4 MG/2ML IJ SOLN: 4.0000 mg | Freq: Four times a day (QID) | INTRAMUSCULAR | Status: DC | PRN

## 2022-06-05 MED ORDER — HEPARIN SODIUM (PORCINE) 5000 UNIT/ML IJ SOLN
5000.0000 [IU] | Freq: Three times a day (TID) | INTRAMUSCULAR | Status: DC
  Administered 2022-06-05 – 2022-06-08 (×8): 5000 [IU] via SUBCUTANEOUS
  Filled 2022-06-05 (×8): qty 1

## 2022-06-05 MED ORDER — SODIUM CHLORIDE 0.9 % IV SOLN: 1.0000 g | Freq: Once | INTRAVENOUS | Status: DC

## 2022-06-05 MED ORDER — ONDANSETRON HCL 4 MG PO TABS: 4.0000 mg | ORAL_TABLET | Freq: Four times a day (QID) | ORAL | Status: DC | PRN

## 2022-06-05 MED ORDER — SODIUM CHLORIDE 0.9 % IV SOLN
500.0000 mg | Freq: Once | INTRAVENOUS | Status: AC
  Administered 2022-06-05: 500 mg via INTRAVENOUS
  Filled 2022-06-05: qty 5

## 2022-06-05 MED ORDER — METOPROLOL SUCCINATE ER 50 MG PO TB24
50.0000 mg | ORAL_TABLET | Freq: Every day | ORAL | Status: DC
  Administered 2022-06-05 – 2022-06-08 (×3): 50 mg via ORAL
  Filled 2022-06-05 (×3): qty 1

## 2022-06-05 MED ORDER — ACETAMINOPHEN 650 MG RE SUPP: 650.0000 mg | Freq: Four times a day (QID) | RECTAL | Status: DC | PRN

## 2022-06-05 MED ORDER — ROSUVASTATIN CALCIUM 20 MG PO TABS
40.0000 mg | ORAL_TABLET | Freq: Every day | ORAL | Status: DC
  Administered 2022-06-05 – 2022-06-07 (×3): 40 mg via ORAL
  Filled 2022-06-05 (×3): qty 2

## 2022-06-05 MED ORDER — ASPIRIN 81 MG PO TBEC
81.0000 mg | DELAYED_RELEASE_TABLET | Freq: Every day | ORAL | Status: DC
  Administered 2022-06-05 – 2022-06-08 (×3): 81 mg via ORAL
  Filled 2022-06-05 (×3): qty 1

## 2022-06-05 MED ORDER — CHLORHEXIDINE GLUCONATE CLOTH 2 % EX PADS
6.0000 | MEDICATED_PAD | Freq: Every day | CUTANEOUS | Status: DC
  Administered 2022-06-06 – 2022-06-08 (×3): 6 via TOPICAL

## 2022-06-05 MED ORDER — ACETAMINOPHEN 325 MG PO TABS: 650.0000 mg | ORAL_TABLET | Freq: Four times a day (QID) | ORAL | Status: DC | PRN

## 2022-06-05 MED ORDER — HYDRALAZINE HCL 50 MG PO TABS
50.0000 mg | ORAL_TABLET | Freq: Three times a day (TID) | ORAL | Status: DC
  Administered 2022-06-05 – 2022-06-08 (×6): 50 mg via ORAL
  Filled 2022-06-05 (×7): qty 1

## 2022-06-05 MED ORDER — IPRATROPIUM-ALBUTEROL 0.5-2.5 (3) MG/3ML IN SOLN
3.0000 mL | Freq: Four times a day (QID) | RESPIRATORY_TRACT | Status: DC
  Administered 2022-06-05 (×2): 3 mL via RESPIRATORY_TRACT
  Filled 2022-06-05 (×2): qty 3

## 2022-06-05 MED ORDER — ENOXAPARIN SODIUM 30 MG/0.3ML IJ SOSY
30.0000 mg | PREFILLED_SYRINGE | INTRAMUSCULAR | Status: DC
Start: 2022-06-05 — End: 2022-06-05

## 2022-06-05 MED ORDER — INSULIN ASPART 100 UNIT/ML IJ SOLN
0.0000 [IU] | Freq: Every day | INTRAMUSCULAR | Status: DC
  Administered 2022-06-07: 3 [IU] via SUBCUTANEOUS

## 2022-06-05 NOTE — ED Triage Notes (Signed)
Patient c/o sob onset last week states it got worse over the weekend  started with a cough , states he goes to dialysis T-TH-S, had full dialysis on Sat/

## 2022-06-05 NOTE — ED Notes (Signed)
Pt O2 was decreased to 1 liter by admitting doctor. Pt SPO2 dropped to 83% so O2 was increased back to 3L

## 2022-06-05 NOTE — Hospital Course (Signed)
Justin [name] is a 72 y.o. person living with a history of hypertension, ESRD on dialysis Tuesday, Thursday, Saturday, HFrecEF, type 2 diabetes who presents with concerns of shortness of breath.  Patient found to have acute hypoxemic respiratory failure requiring supplemental oxygen and admitted for further evaluation and management.    #Acute hypoxemic respiratory failure Patient presents with concerns of shortness of breath requiring supplemental oxygen.  Patient did have history of cough that started 6 days ago.  He has not improved since, and the cough has become productive as well now.  Patient also had history of profuse bleeding from AV fistula 6 days ago.  On exam, patient does have a lung sounds concerning for pulmonary edema.  He is currently on 3 L of oxygen nasal cannula.  Imaging revealing concern for pulmonary edema.  I do not think this is ACS, PE, pneumonia, or pneumothorax.  Given history and supporting evidence, likely respiratory failure is multifactorial in the setting of suspected URI, volume overload, as well as acute on chronic anemia. Will manage with breathing treatments as well as dialysis.  Will continue to monitor CBC.  Will stop antibiotics as I do not think this is infectious in nature. -DuoNebs every 6 hours -Continue to monitor SpO2 to keep greater than 92% -Monitor CBC -Discontinue antibiotics for now, if patient has fever, can start antibiotics   #Acute on chronic anemia Patient baseline hemoglobin around 9.3-10.  Patient presents with hemoglobin of 7.7 today.  Given history, I likely think patient had a good amount of blood loss 6 days ago through his AV fistula.  Likely, patient is having trouble with making up for this loss.  I do not think patient is currently having an active bleed.  I do not think this is of GI etiology.  However, if patient does start having any signs of bleeding overnight, would like to get a stat H&H.  Will check iron studies today. -Iron studies  pending -CBC in the a.m. -If patient does start bleeding overnight, obtain stat H&H   #ESRD on HD Nephrology following.  Patient has not missed a dialysis appointment this week.  Most recent dialysis appointment that he missed was on 05/27/2022.  Plan for dialysis tomorrow 06/06/2022. -Nephrology following -Dialysis on 06/06/2022 -Continue ferric citrate 420 mg twice daily   #Type 2 diabetes mellitus Patient has history of type 2 diabetes mellitus.  Most recent A1c showing 5.9 in January 2024.  Patient normally does 20 units insulin (70/30) nightly.  Will resume during hospitalization. -Continue CBG checks -Continue Novolin 20 units nightly -Sliding scale insulin -A1c pending    #HFrecEF Do not think patient currently is an acute exacerbation.  Patient's most recent echo on 04/12/2022 showing ejection fraction of 60 to 65%.  No JVD on exam.  No lower extremity edema on exam.  Will continue to monitor fluid status during hospitalization. -Continue monitor fluid status -HD tomorrow 06/06/22 -Continue metoprolol succinate 50 mg daily -Continue hydralazine 50 mg 3 times daily -Will hold Imdur 30 mg on nondialysis days   #Hypertension Patient has past medical history of hypertension.  Home medications include metoprolol succinate 50 mg daily, hydralazine 50 mg 3 times daily, Imdur 30 mg on nondialysis days.  Blood pressure measuring well during hospitalization. -Continue metoprolol succinate 50 mg daily -Continue hydralazine 50 mg 3 times daily -Hold Imdur 30 mg on nondialysis days   #Hyperlipidemia Patient has a past medical history of hyperlipidemia. -Continue Crestor 40 mg daily  6/4 Patient reports  shortness of breath better. No changes in BM or noticing blood in his stool. Appetite has been poor. Discussed blood transfusion today.

## 2022-06-05 NOTE — ED Notes (Signed)
ED TO INPATIENT HANDOFF REPORT  ED Nurse Name and Phone #: Dot Lanes Paramedic [phone]  S Name/Age/Gender Justin [name] 72 y.o. male Room/Bed: 035C/035C  Code Status   Code Status: Full Code  Home/SNF/Other Home Patient oriented to: self, place, time, and situation Is this baseline? Yes   Triage Complete: Triage complete  Chief Complaint Acute hypoxic respiratory failure (HCC) [J96.01]  Triage Note Patient c/o sob onset last week states it got worse over the weekend  started with a cough , states he goes to dialysis T-TH-S, had full dialysis on Sat/    Allergies No Known Allergies  Level of Care/Admitting Diagnosis ED Disposition     ED Disposition  Admit   Condition  --   Comment  Hospital Area: [facility] [[id number]]  Level of Care: Telemetry Medical [104]  May place patient in observation at [facility] or [facility] if equivalent level of care is available:: No  Covid Evaluation: Confirmed COVID Negative  Diagnosis: Acute hypoxic respiratory failure [facility]) [[id number]]  Admitting Physician: Earl Lagos [phone]  Attending Physician: Earl Lagos [phone]          B Medical/Surgery History Past Medical History:  Diagnosis Date   BPH (benign prostatic hyperplasia)    Chronic kidney disease (CKD), stage IV (severe) (HCC)    Diabetes (HCC)    Dysuria    Erectile dysfunction    Hyperlipemia    Hypertension    Pinna disorder, left    Renal disorder    Wears glasses    Past Surgical History:  Procedure Laterality Date   AV FISTULA PLACEMENT Left 04/30/2018   Procedure: CREATION OF RADIOCEPHALIC ARTERIOVENOUS FISTULA LEFT ARM;  Surgeon: Sherren Kerns, MD;  Location: [facility] OR;  Service: Vascular;  Laterality: Left;   AV FISTULA PLACEMENT Left 08/28/2018   Procedure: ARTERIOVENOUS (AV) BRACHIOCEPHALIC FISTULA CREATION LEFT UPPER ARM;  Surgeon: Cephus Shelling, MD;  Location: [facility] OR;  Service: Vascular;  Laterality: Left;   IR  FLUORO GUIDE CV LINE RIGHT  04/29/2018   IR US GUIDE VASC ACCESS RIGHT  04/29/2018   RIGHT/LEFT HEART CATH AND CORONARY ANGIOGRAPHY N/A 05/01/2018   Procedure: RIGHT/LEFT HEART CATH AND CORONARY ANGIOGRAPHY;  Surgeon: Lyn Records, MD;  Location: [facility] INVASIVE CV LAB;  Service: Cardiovascular;  Laterality: N/A;   subdural hematoma Right 07/2020     A IV Location/Drains/Wounds Patient Lines/Drains/Airways Status     Active Line/Drains/Airways     Name Placement date Placement time Site Days   Peripheral IV 06/05/22 20 G Anterior;Right Forearm 06/05/22  1011  Forearm  less than 1   Fistula / Graft Left Forearm Arteriovenous fistula 04/30/18  1630  Forearm  1497   Fistula / Graft Left Upper arm Arteriovenous fistula 08/28/18  0814  Upper arm  1377   Hemodialysis Catheter Right Internal jugular Double-lumen 04/29/18  1314  Internal jugular  1498   Incision (Closed) 04/30/18 Arm Left 04/30/18  1659  -- 1497            Intake/Output Last 24 hours No intake or output data in the 24 hours ending 06/05/22 1429  Labs/Imaging Results for orders placed or performed during the hospital encounter of 06/05/22 (from the past 48 hour(s))  CBC with Differential     Status: Abnormal   Collection Time: 06/05/22 10:10 AM  Result Value Ref Range   WBC 8.1 4.0 - 10.5 K/uL   RBC 2.58 (L) 4.22 - 5.81 MIL/uL   Hemoglobin 7.7 (  L) 13.0 - 17.0 g/dL   HCT 40.9 (L) 81.1 - 91.4 %   MCV 88.8 80.0 - 100.0 fL   MCH 29.8 26.0 - 34.0 pg   MCHC 33.6 30.0 - 36.0 g/dL   RDW 78.2 95.6 - 21.3 %   Platelets 181 150 - 400 K/uL   nRBC 0.0 0.0 - 0.2 %   Neutrophils Relative % 83 %   Neutro Abs 6.7 1.7 - 7.7 K/uL   Lymphocytes Relative 7 %   Lymphs Abs 0.5 (L) 0.7 - 4.0 K/uL   Monocytes Relative 9 %   Monocytes Absolute 0.8 0.1 - 1.0 K/uL   Eosinophils Relative 0 %   Eosinophils Absolute 0.0 0.0 - 0.5 K/uL   Basophils Relative 0 %   Basophils Absolute 0.0 0.0 - 0.1 K/uL   Immature Granulocytes 1 %   Abs  Immature Granulocytes 0.04 0.00 - 0.07 K/uL    Comment: Performed at [facility], [address] [place], [place] [address]  Comprehensive metabolic panel     Status: Abnormal   Collection Time: 06/05/22 10:10 AM  Result Value Ref Range   Sodium 137 135 - 145 mmol/L   Potassium 3.9 3.5 - 5.1 mmol/L   Chloride 92 (L) 98 - 111 mmol/L   CO2 27 22 - 32 mmol/L   Glucose, Bld 192 (H) 70 - 99 mg/dL    Comment: Glucose reference range applies only to samples taken after fasting for at least 8 hours.   BUN 47 (H) 8 - 23 mg/dL   Creatinine, Ser 8.46 (H) 0.61 - 1.24 mg/dL   Calcium 8.8 (L) 8.9 - 10.3 mg/dL   Total Protein 6.5 6.5 - 8.1 g/dL   Albumin 3.2 (L) 3.5 - 5.0 g/dL   AST 22 15 - 41 U/L   ALT 27 0 - 44 U/L   Alkaline Phosphatase 67 38 - 126 U/L   Total Bilirubin 1.6 (H) 0.3 - 1.2 mg/dL   GFR, Estimated 6 (L) >60 mL/min    Comment: (NOTE) Calculated using the CKD-EPI Creatinine Equation (2021)    Anion gap 18 (H) 5 - 15    Comment: Performed at [facility], [address] [place], [place] [address]  Troponin I (High Sensitivity)     Status: Abnormal   Collection Time: 06/05/22 10:10 AM  Result Value Ref Range   Troponin I (High Sensitivity) 53 (H) <18 ng/L    Comment: (NOTE) Elevated high sensitivity troponin I (hsTnI) values and significant  changes across serial measurements may suggest ACS but many other  chronic and acute conditions are known to elevate hsTnI results.  Refer to the "Links" section for chest pain algorithms and additional  guidance. Performed at [facility], [address] [place], [place] [address]   Brain natriuretic peptide     Status: Abnormal   Collection Time: 06/05/22 10:10 AM  Result Value Ref Range   B Natriuretic Peptide 1,835.7 (H) 0.0 - 100.0 pg/mL    Comment: Performed at [facility], [address] [place], [place] [address]  I-stat chem 8, ED     Status: Abnormal   Collection Time: 06/05/22 10:17 AM  Result  Value Ref Range   Sodium 134 (L) 135 - 145 mmol/L   Potassium 3.9 3.5 - 5.1 mmol/L   Chloride 93 (L) 98 - 111 mmol/L   BUN 47 (H) 8 - 23 mg/dL   Creatinine, Ser 02.72 (H) 0.61 - 1.24  mg/dL   Glucose, Bld 409 (H) 70 - 99 mg/dL    Comment: Glucose reference range applies only to samples taken after fasting for at least 8 hours.   Calcium, Ion 1.05 (L) 1.15 - 1.40 mmol/L   TCO2 28 22 - 32 mmol/L   Hemoglobin 7.5 (L) 13.0 - 17.0 g/dL   HCT 81.1 (L) 91.4 - 78.2 %  I-Stat venous blood gas, ED     Status: Abnormal   Collection Time: 06/05/22 10:17 AM  Result Value Ref Range   pH, Ven 7.446 (H) 7.25 - 7.43   pCO2, Ven 40.6 (L) 44 - 60 mmHg   pO2, Ven 54 (H) 32 - 45 mmHg   Bicarbonate 28.0 20.0 - 28.0 mmol/L   TCO2 29 22 - 32 mmol/L   O2 Saturation 89 %   Acid-Base Excess 4.0 (H) 0.0 - 2.0 mmol/L   Sodium 134 (L) 135 - 145 mmol/L   Potassium 3.9 3.5 - 5.1 mmol/L   Calcium, Ion 1.04 (L) 1.15 - 1.40 mmol/L   HCT 23.0 (L) 39.0 - 52.0 %   Hemoglobin 7.8 (L) 13.0 - 17.0 g/dL   Sample type VENOUS   Resp panel by RT-PCR (RSV, Flu A&B, Covid) Anterior Nasal Swab     Status: None   Collection Time: 06/05/22 11:56 AM   Specimen: Anterior Nasal Swab  Result Value Ref Range   SARS Coronavirus 2 by RT PCR NEGATIVE NEGATIVE   Influenza A by PCR NEGATIVE NEGATIVE   Influenza B by PCR NEGATIVE NEGATIVE    Comment: (NOTE) The Xpert Xpress SARS-CoV-2/FLU/RSV plus assay is intended as an aid in the diagnosis of influenza from Nasopharyngeal swab specimens and should not be used as a sole basis for treatment. Nasal washings and aspirates are unacceptable for Xpert Xpress SARS-CoV-2/FLU/RSV testing.  Fact Sheet for Patients: BloggerCourse.com  Fact Sheet for Healthcare Providers: SeriousBroker.it  This test is not yet approved or cleared by the [place] FDA and has been authorized for detection and/or diagnosis of SARS-CoV-2 by FDA under an  Emergency Use Authorization (EUA). This EUA will remain in effect (meaning this test can be used) for the duration of the COVID-19 declaration under Section 564(b)(1) of the Act, 21 U.S.C. section 360bbb-3(b)(1), unless the authorization is terminated or revoked.     Resp Syncytial Virus by PCR NEGATIVE NEGATIVE    Comment: (NOTE) Fact Sheet for Patients: BloggerCourse.com  Fact Sheet for Healthcare Providers: SeriousBroker.it  This test is not yet approved or cleared by the [place] FDA and has been authorized for detection and/or diagnosis of SARS-CoV-2 by FDA under an Emergency Use Authorization (EUA). This EUA will remain in effect (meaning this test can be used) for the duration of the COVID-19 declaration under Section 564(b)(1) of the Act, 21 U.S.C. section 360bbb-3(b)(1), unless the authorization is terminated or revoked.  Performed at [facility], [address], [place], [place] [address]   Troponin I (High Sensitivity)     Status: Abnormal   Collection Time: 06/05/22 12:24 PM  Result Value Ref Range   Troponin I (High Sensitivity) 54 (H) <18 ng/L    Comment: (NOTE) Elevated high sensitivity troponin I (hsTnI) values and significant  changes across serial measurements may suggest ACS but many other  chronic and acute conditions are known to elevate hsTnI results.  Refer to the "Links" section for chest pain algorithms and additional  guidance. Performed at [facility], [address] [place], [place] [address]  Lactic acid, plasma     Status: None   Collection Time: 06/05/22 12:24 PM  Result Value Ref Range   Lactic Acid, Venous 1.2 0.5 - 1.9 mmol/L    Comment: Performed at [facility], [address], [place], [place] [address]   DG Chest Port 1 View  Result Date: 06/05/2022 CLINICAL DATA:  sob EXAM: PORTABLE CHEST 1 VIEW COMPARISON:  CXR 01/06/19 FINDINGS: Left axillary vascular stent  in place. No pleural effusion. No pneumothorax. Cardiomegaly. Prominent bilateral interstitial opacities, favored to represent pulmonary venous congestion or mild pulmonary edema. No radiographically apparent displaced rib fractures. Visualized upper abdomen is unremarkable. IMPRESSION: Cardiomegaly with prominent bilateral interstitial opacities, favored to represent pulmonary venous congestion or mild pulmonary edema. Electronically Signed   By: Lorenza Cambridge M.D.   On: 06/05/2022 10:48    Pending Labs Unresulted Labs (From admission, onward)     Start     Ordered   06/06/22 0500  Iron and TIBC  Tomorrow morning,   R        06/05/22 1359   06/06/22 0500  Ferritin  Tomorrow morning,   R        06/05/22 1359   06/06/22 0500  CBC  Tomorrow morning,   R        06/05/22 1359   06/06/22 0500  Renal function panel  Tomorrow morning,   R        06/05/22 1400   06/05/22 1424  Hemoglobin A1c  Once,   R       Comments: To assess prior glycemic control    06/05/22 1424   06/05/22 1306  Hepatitis B surface antigen  (New Admission Hemo Labs (Hepatitis B))  Once,   URGENT        06/05/22 1306   06/05/22 1306  Hepatitis B surface antibody,quantitative  (New Admission Hemo Labs (Hepatitis B))  Once,   URGENT        06/05/22 1306   06/05/22 1155  Culture, blood (routine x 2)  BLOOD CULTURE X 2,   R (with STAT occurrences)      06/05/22 1154   06/05/22 1155  Lactic acid, plasma  Now then every 2 hours,   R (with STAT occurrences)      06/05/22 1154   Signed and Held  Renal function panel  Once,   R        Signed and Held   Signed and Held  CBC  Once,   R        Signed and Held            Vitals/Pain Today's Vitals   06/05/22 1100 06/05/22 1315 06/05/22 1343 06/05/22 1416  BP: (!) 135/55 (!) 123/53  (!) 124/49  Pulse: 87 (!) 59  (!) 57  Resp: (!) 22 16  (!) 22  Temp:   98.5 F (36.9 C) 98.3 F (36.8 C)  TempSrc:   Oral Oral  SpO2: 93% 100%  93%  Weight:      Height:      PainSc: 0-No  pain   0-No pain    Isolation Precautions No active isolations  Medications Medications  Chlorhexidine Gluconate Cloth 2 % PADS 6 each (has no administration in time range)  acetaminophen (TYLENOL) tablet 650 mg (has no administration in time range)    Or  acetaminophen (TYLENOL) suppository 650 mg (has no administration in time range)  polyethylene glycol (MIRALAX / GLYCOLAX) packet 17 g (has no administration  in time range)  ondansetron (ZOFRAN) tablet 4 mg (has no administration in time range)    Or  ondansetron (ZOFRAN) injection 4 mg (has no administration in time range)  ipratropium-albuterol (DUONEB) 0.5-2.5 (3) MG/3ML nebulizer solution 3 mL (3 mLs Nebulization Given 06/05/22 1419)  heparin injection 5,000 Units (has no administration in time range)  amLODipine (NORVASC) tablet 10 mg (has no administration in time range)  aspirin EC tablet 81 mg (has no administration in time range)  hydrALAZINE (APRESOLINE) tablet 25 mg (has no administration in time range)  metoprolol succinate (TOPROL-XL) 24 hr tablet 50 mg (has no administration in time range)  insulin aspart protamine- aspart (NOVOLOG MIX 70/30) injection 20 Units (has no administration in time range)  rosuvastatin (CRESTOR) tablet 40 mg (has no administration in time range)  insulin aspart (novoLOG) injection 0-6 Units (has no administration in time range)  insulin aspart (novoLOG) injection 0-5 Units (has no administration in time range)  azithromycin (ZITHROMAX) 500 mg in sodium chloride 0.9 % 250 mL IVPB (0 mg Intravenous Stopped 06/05/22 1400)    Mobility walks     Focused Assessments    R Recommendations: See Admitting Provider Note  Report given to:   Additional Notes:

## 2022-06-05 NOTE — ED Provider Notes (Signed)
[place] EMERGENCY DEPARTMENT AT [facility] Provider Note   CSN: [id number] Arrival date & time: 06/05/22  6962     History  Chief Complaint  Patient presents with   Shortness of Breath   Cough    Justin [name] is a 72 y.o. male.  72 year old male with prior medical history as detailed below presents for evaluation.  Patient reports upper respiratory congestion and mild cough that began last week.  Over the weekend he experienced increased  Patient is on dialysis.  He goes on a Tuesday Thursday Saturday schedule.  His last full dialysis session was on Saturday.  He denies chest pain.  He denies fevers.  He was seen by urgent care this morning and noted to be hypoxic and sent to the ED for evaluation.  On arrival the patient appears comfortable however his pulse ox is in the low to mid 80s on room air.  The history is provided by the patient and medical records.  Cough      Home Medications Prior to Admission medications   Medication Sig Start Date End Date Taking? Authorizing Provider  amLODipine (NORVASC) 10 MG tablet Take 1 tablet (10 mg total) by mouth daily. 11/21/21   Georgeanna Lea, MD  aspirin EC 81 MG tablet Take 81 mg by mouth daily. Swallow whole.    [provider]  calcium acetate (PHOSLO) 667 MG capsule Take 2,668 mg by mouth daily. 04/27/20   [provider]  hydrALAZINE (APRESOLINE) 25 MG tablet Take 25 mg by mouth 3 (three) times daily.    [provider]  isosorbide mononitrate (IMDUR) 30 MG 24 hr tablet TAKE 1 TABLET BY MOUTH IN THE MORNING ON  NON  DIALYSIS  DAYS 04/18/22   Georgeanna Lea, MD  metoprolol succinate (TOPROL-XL) 50 MG 24 hr tablet Take 50 mg by mouth daily. Take 2 tablets 2xs a day mon,wed,fri,sun    [provider]  nitroGLYCERIN (NITROSTAT) 0.4 MG SL tablet Place 0.4 mg under the tongue every 5 (five) minutes as needed for chest pain.    [provider]  NOVOLIN 70/30 RELION  (70-30) 100 UNIT/ML injection Inject 20 Units into the skin daily. Patient taking differently: Inject 20 Units into the skin See admin instructions. Inject 20 units subcutaneously with breakfast on Sunday, Monday, Wednesday, Friday; inject 20 units with lunch on Tuesday, Thursday, Saturday (dialysis days) 05/02/18   Albertine Grates, MD  rosuvastatin (CRESTOR) 40 MG tablet Take 40 mg by mouth daily. 01/22/22   [provider]      Allergies    Patient has no known allergies.    Review of Systems   Review of Systems  All other systems reviewed and are negative.   Physical Exam Updated Vital Signs BP (!) 135/55 (BP Location: Right Arm)   Pulse 87   Temp 98.7 F (37.1 C) (Oral)   Resp (!) 22   Ht 5\' 9"  (1.753 m)   Wt 82.6 kg   SpO2 93%   BMI 26.88 kg/m  Physical Exam Vitals and nursing note reviewed.  Constitutional:      General: He is not in acute distress.    Appearance: Normal appearance. He is well-developed.  HENT:     Head: Normocephalic and atraumatic.  Eyes:     Conjunctiva/sclera: Conjunctivae normal.     Pupils: Pupils are equal, round, and reactive to light.  Cardiovascular:     Rate and Rhythm: Normal rate and regular rhythm.  Heart sounds: Normal heart sounds.  Pulmonary:     Effort: Pulmonary effort is normal. No respiratory distress.     Breath sounds: Examination of the right-lower field reveals decreased breath sounds. Examination of the left-lower field reveals decreased breath sounds. Decreased breath sounds present.  Abdominal:     General: There is no distension.     Palpations: Abdomen is soft.     Tenderness: There is no abdominal tenderness.  Musculoskeletal:        General: No deformity. Normal range of motion.     Cervical back: Normal range of motion and neck supple.  Skin:    General: Skin is warm and dry.  Neurological:     General: No focal deficit present.     Mental Status: He is alert and oriented to person, place, and time.      ED Results / Procedures / Treatments   Labs (all labs ordered are listed, but only abnormal results are displayed) Labs Reviewed  CBC WITH DIFFERENTIAL/PLATELET - Abnormal; Notable for the following components:      Result Value   RBC 2.58 (*)    Hemoglobin 7.7 (*)    HCT 22.9 (*)    Lymphs Abs 0.5 (*)    All other components within normal limits  COMPREHENSIVE METABOLIC PANEL - Abnormal; Notable for the following components:   Chloride 92 (*)    Glucose, Bld 192 (*)    BUN 47 (*)    Creatinine, Ser 8.97 (*)    Calcium 8.8 (*)    Albumin 3.2 (*)    Total Bilirubin 1.6 (*)    GFR, Estimated 6 (*)    Anion gap 18 (*)    All other components within normal limits  BRAIN NATRIURETIC PEPTIDE - Abnormal; Notable for the following components:   B Natriuretic Peptide 1,835.7 (*)    All other components within normal limits  I-STAT CHEM 8, ED - Abnormal; Notable for the following components:   Sodium 134 (*)    Chloride 93 (*)    BUN 47 (*)    Creatinine, Ser 10.10 (*)    Glucose, Bld 192 (*)    Calcium, Ion 1.05 (*)    Hemoglobin 7.5 (*)    HCT 22.0 (*)    All other components within normal limits  I-STAT VENOUS BLOOD GAS, ED - Abnormal; Notable for the following components:   pH, Ven 7.446 (*)    pCO2, Ven 40.6 (*)    pO2, Ven 54 (*)    Acid-Base Excess 4.0 (*)    Sodium 134 (*)    Calcium, Ion 1.04 (*)    HCT 23.0 (*)    Hemoglobin 7.8 (*)    All other components within normal limits  TROPONIN I (HIGH SENSITIVITY) - Abnormal; Notable for the following components:   Troponin I (High Sensitivity) 53 (*)    All other components within normal limits  CULTURE, BLOOD (ROUTINE X 2)  CULTURE, BLOOD (ROUTINE X 2)  LACTIC ACID, PLASMA  LACTIC ACID, PLASMA  TROPONIN I (HIGH SENSITIVITY)    EKG EKG Interpretation  Date/Time:  Monday June 05 2022 09:55:51 EDT Ventricular Rate:  63 PR Interval:  249 QRS Duration: 114 QT Interval:  474 QTC Calculation: 486 R  Axis:   -28 Text Interpretation: Sinus rhythm Prolonged PR interval Borderline intraventricular conduction delay Nonspecific repol abnormality, diffuse leads Confirmed by Kristine Royal [phone]) on 06/05/2022 10:02:53 AM  Radiology DG Chest Port 1 View  Result Date: 06/05/2022  CLINICAL DATA:  sob EXAM: PORTABLE CHEST 1 VIEW COMPARISON:  CXR 01/06/19 FINDINGS: Left axillary vascular stent in place. No pleural effusion. No pneumothorax. Cardiomegaly. Prominent bilateral interstitial opacities, favored to represent pulmonary venous congestion or mild pulmonary edema. No radiographically apparent displaced rib fractures. Visualized upper abdomen is unremarkable. IMPRESSION: Cardiomegaly with prominent bilateral interstitial opacities, favored to represent pulmonary venous congestion or mild pulmonary edema. Electronically Signed   By: Lorenza Cambridge M.D.   On: 06/05/2022 10:48    Procedures Procedures    Medications Ordered in ED Medications - No data to display  ED Course/ Medical Decision Making/ A&P                             Medical Decision Making Amount and/or Complexity of Data Reviewed Labs: ordered. Radiology: ordered.    Medical Screen Complete  This patient presented to the ED with complaint of dyspnea.  This complaint involves an extensive number of treatment options. The initial differential diagnosis includes, but is not limited to, CHF exacerbation, ESRD, infection, etc.  This presentation is: Acute, Chronic, Self-Limited, Previously Undiagnosed, Uncertain Prognosis, Complicated, Systemic Symptoms, and Threat to Life/Bodily Function  Patient is presenting with complaint of increased shortness of breath.  Patient is noted to be hypoxic on room air into the mid 80s.  With supplemental O2 at 3 L the patient is significant improved with pulse ox in the upper 90s.  Patient's workup is most suggestive of fluid overload.  He reports completed dialysis session on this past  Saturday.  He is not due for dialysis again until tomorrow.  Patient is noted to be anemic which appears to be a chronic issue per his report.  Patient without overt clear evidence of pneumonia however, patient reports mild cough and symptoms concerning for possible early infection.  Will cover with antibiotics.  Nephrology is aware of case and will consult.  Medicine team is aware of case and will evaluate for admission.  Additional history obtained:  Additional history obtained from [facility] External records from outside sources obtained and reviewed including prior ED visits and prior Inpatient records.    Lab Tests:  I ordered and personally interpreted labs.    Imaging Studies ordered:  I ordered imaging studies including CXR  I independently visualized and interpreted obtained imaging which showed edema I agree with the radiologist interpretation.   Cardiac Monitoring:  The patient was maintained on a cardiac monitor.  I personally viewed and interpreted the cardiac monitor which showed an underlying rhythm of: NSR   Medicines ordered:  I ordered medication including antibiotics for possible pneumonia Reevaluation of the patient after these medicines showed that the patient: improved   Problem List / ED Course:  Dyspnea, hypoxia   Reevaluation:  After the interventions noted above, I reevaluated the patient and found that they have: improved   Disposition:  After consideration of the diagnostic results and the patients response to treatment, I feel that the patent would benefit from admission.          Final Clinical Impression(s) / ED Diagnoses Final diagnoses:  Dyspnea, unspecified type    Rx / DC Orders ED Discharge Orders     None         Wynetta Fines, MD 06/05/22 1208

## 2022-06-05 NOTE — Consult Note (Signed)
[facility] Renal Consultation Note    Indication for Consultation:  Management of ESRD/hemodialysis; anemia, hypertension/volume and secondary hyperparathyroidism  HPI: Justin [name] is a 72 y.o. male with a PMH significant for HTN, DM, CHF, HLD, and ESRD on HD TTS who presented to [facility] ED with a 1 week history of cough, congestion and worsening SOB.  He went to urgent care this morning and noted to be hypoxic and sent to the ED for evaluation.  Upon arrival, he was comfortable, however SpO2 was in the mid 80's on room air and improved to the 90's on supplemental oxygen.  He was afebrile, and VSS.  CXR with cardiomegaly and prominent bilateral interstitial opacities, favoring pulmonary venous congestion or mild pulmonary edema.  We were consulted to provide dialysis during his hospitalization.  He has not missed HD and has had full HD treatments.  He has been getting to his EDW but thinks he has been losing weight due to poor appetite for the past 6 months.  He denies any F/C/N/V/D or sick contacts.  Past Medical History:  Diagnosis Date   BPH (benign prostatic hyperplasia)    Chronic kidney disease (CKD), stage IV (severe) (HCC)    Diabetes (HCC)    Dysuria    Erectile dysfunction    Hyperlipemia    Hypertension    Pinna disorder, left    Renal disorder    Wears glasses    Past Surgical History:  Procedure Laterality Date   AV FISTULA PLACEMENT Left 04/30/2018   Procedure: CREATION OF RADIOCEPHALIC ARTERIOVENOUS FISTULA LEFT ARM;  Surgeon: Sherren Kerns, MD;  Location[facility];  Service: Vascular;  Laterality: Left;   AV FISTULA PLACEMENT Left 08/28/2018   Procedure: ARTERIOVENOUS (AV) BRACHIOCEPHALIC FISTULA CREATION LEFT UPPER ARM;  Surgeon: Cephus Shelling, MD;  Location: [facility] OR;  Service: Vascular;  Laterality: Left;   IR FLUORO GUIDE CV LINE RIGHT  04/29/2018   IR US GUIDE VASC ACCESS RIGHT  04/29/2018   RIGHT/LEFT HEART CATH AND CORONARY ANGIOGRAPHY N/A  05/01/2018   Procedure: RIGHT/LEFT HEART CATH AND CORONARY ANGIOGRAPHY;  Surgeon: Lyn Records, MD;  Location: [facility] INVASIVE CV LAB;  Service: Cardiovascular;  Laterality: N/A;   subdural hematoma Right 07/2020   Family History:   Family History  Problem Relation Age of Onset   Stroke Mother    Diabetes Father    Social History:  reports that he has never smoked. He has never used smokeless tobacco. He reports that he does not currently use alcohol. He reports that he does not use drugs. No Known Allergies Prior to Admission medications   Medication Sig Start Date End Date Taking? Authorizing Provider  amLODipine (NORVASC) 10 MG tablet Take 1 tablet (10 mg total) by mouth daily. 11/21/21   Georgeanna Lea, MD  aspirin EC 81 MG tablet Take 81 mg by mouth daily. Swallow whole.    [provider]  calcium acetate (PHOSLO) 667 MG capsule Take 2,668 mg by mouth daily. 04/27/20   [provider]  hydrALAZINE (APRESOLINE) 25 MG tablet Take 25 mg by mouth 3 (three) times daily.    [provider]  isosorbide mononitrate (IMDUR) 30 MG 24 hr tablet TAKE 1 TABLET BY MOUTH IN THE MORNING ON  NON  DIALYSIS  DAYS 04/18/22   Georgeanna Lea, MD  metoprolol succinate (TOPROL-XL) 50 MG 24 hr tablet Take 50 mg by mouth daily. Take 2 tablets 2xs a day mon,wed,fri,sun    [provider]  nitroGLYCERIN (NITROSTAT) 0.4 MG SL tablet Place 0.4 mg under the tongue every 5 (five) minutes as needed for chest pain.    [provider]  NOVOLIN 70/30 RELION (70-30) 100 UNIT/ML injection Inject 20 Units into the skin daily. Patient taking differently: Inject 20 Units into the skin See admin instructions. Inject 20 units subcutaneously with breakfast on Sunday, Monday, Wednesday, Friday; inject 20 units with lunch on Tuesday, Thursday, Saturday (dialysis days) 05/02/18   Albertine Grates, MD  rosuvastatin (CRESTOR) 40 MG tablet Take 40 mg by mouth daily. 01/22/22   [provider]   Current Facility-Administered Medications  Medication Dose Route Frequency Provider Last Rate Last Admin   azithromycin (ZITHROMAX) 500 mg in sodium chloride 0.9 % 250 mL IVPB  500 mg Intravenous Once Wynetta Fines, MD 250 mL/hr at 06/05/22 1220 500 mg at 06/05/22 1220   cefTRIAXone (ROCEPHIN) 1 g in sodium chloride 0.9 % 100 mL IVPB  1 g Intravenous Once Wynetta Fines, MD       Current Outpatient Medications  Medication Sig Dispense Refill   amLODipine (NORVASC) 10 MG tablet Take 1 tablet (10 mg total) by mouth daily. 90 tablet 3   aspirin EC 81 MG tablet Take 81 mg by mouth daily. Swallow whole.     calcium acetate (PHOSLO) 667 MG capsule Take 2,668 mg by mouth daily.     hydrALAZINE (APRESOLINE) 25 MG tablet Take 25 mg by mouth 3 (three) times daily.     isosorbide mononitrate (IMDUR) 30 MG 24 hr tablet TAKE 1 TABLET BY MOUTH IN THE MORNING ON  NON  DIALYSIS  DAYS 60 tablet 2   metoprolol succinate (TOPROL-XL) 50 MG 24 hr tablet Take 50 mg by mouth daily. Take 2 tablets 2xs a day mon,wed,fri,sun     nitroGLYCERIN (NITROSTAT) 0.4 MG SL tablet Place 0.4 mg under the tongue every 5 (five) minutes as needed for chest pain.     NOVOLIN 70/30 RELION (70-30) 100 UNIT/ML injection Inject 20 Units into the skin daily. (Patient taking differently: Inject 20 Units into the skin See admin instructions. Inject 20 units subcutaneously with breakfast on Sunday, Monday, Wednesday, Friday; inject 20 units with lunch on Tuesday, Thursday, Saturday (dialysis days)) 10 mL 0   rosuvastatin (CRESTOR) 40 MG tablet Take 40 mg by mouth daily.     Labs: Basic Metabolic Panel: Recent Labs  Lab 06/05/22 1010 06/05/22 1017  NA 137 134*  134*  K 3.9 3.9  3.9  CL 92* 93*  CO2 27  --   GLUCOSE 192* 192*  BUN 47* 47*  CREATININE 8.97* 10.10*  CALCIUM 8.8*  --    Liver Function Tests: Recent Labs  Lab 06/05/22 1010  AST 22  ALT 27  ALKPHOS 67  BILITOT 1.6*  PROT 6.5  ALBUMIN 3.2*    No results for input(s): "LIPASE", "AMYLASE" in the last 168 hours. No results for input(s): "AMMONIA" in the last 168 hours. CBC: Recent Labs  Lab 06/05/22 1010 06/05/22 1017  WBC 8.1  --   NEUTROABS 6.7  --   HGB 7.7* 7.8*  7.5*  HCT 22.9* 23.0*  22.0*  MCV 88.8  --   PLT 181  --    Cardiac Enzymes: No results for input(s): "CKTOTAL", "CKMB", "CKMBINDEX", "TROPONINI" in the last 168 hours. CBG: No results for input(s): "GLUCAP" in the last 168 hours. Iron Studies: No results for input(s): "IRON", "TIBC", "TRANSFERRIN", "FERRITIN" in the last 72 hours.  Studies/Results: DG Chest Port 1 View  Result Date: 06/05/2022 CLINICAL DATA:  sob EXAM: PORTABLE CHEST 1 VIEW COMPARISON:  CXR 01/06/19 FINDINGS: Left axillary vascular stent in place. No pleural effusion. No pneumothorax. Cardiomegaly. Prominent bilateral interstitial opacities, favored to represent pulmonary venous congestion or mild pulmonary edema. No radiographically apparent displaced rib fractures. Visualized upper abdomen is unremarkable. IMPRESSION: Cardiomegaly with prominent bilateral interstitial opacities, favored to represent pulmonary venous congestion or mild pulmonary edema. Electronically Signed   By: Lorenza Cambridge M.D.   On: 06/05/2022 10:48    ROS: Pertinent items are noted in HPI. Physical Exam: Vitals:   06/05/22 0955 06/05/22 0959 06/05/22 1008 06/05/22 1100  BP: (!) 139/55   (!) 135/55  Pulse: 63   87  Resp: 18   (!) 22  Temp: 98.7 F (37.1 C)     TempSrc: Oral     SpO2: (!) 86%  93% 93%  Weight:  82.6 kg    Height:  5\' 9"  (1.753 m)        Weight change:  No intake or output data in the 24 hours ending 06/05/22 1245 BP (!) 135/55 (BP Location: Right Arm)   Pulse 87   Temp 98.7 F (37.1 C) (Oral)   Resp (!) 22   Ht 5\' 9"  (1.753 m)   Wt 82.6 kg   SpO2 93%   BMI 26.88 kg/m  General appearance: alert, cooperative, and no distress Head: Normocephalic, without obvious abnormality,  atraumatic Resp: wheezes bilaterally and fine crackles bibasilar Cardio: regular rate and rhythm, S1, S2 normal, no murmur, click, rub or gallop GI: soft, non-tender; bowel sounds normal; no masses,  no organomegaly Extremities: extremities normal, atraumatic, no cyanosis or edema and LUE AVF +T/B Dialysis Access:  Dialysis Orders: Center: [facility]  on TTS . EDW 77.9kg HD Bath 2K/2Ca  Time 4:00 Heparin none. Access LUE AVF BFR 450 DFR A1.5    Micera 75 mcg IVP every 2 weeks Hectoral 2 mcg IVP TIW  Assessment/Plan:  Acute hypoxic respiratory failure - possible volume overload, although he has been compliant with HD.  Viral URI also on DDx.  Currently stable and in no respiratory distress.  Will plan for HD with UF tomorrow on his normal schedule, unless the census allows for short HD session later tonight.  Other interventions, per primary svc.  ESRD -  as above, continue with HD on TTS schedule.  Hypertension/volume  -  Will UF and challenge his EDW as able.  Anemia  - His Hgb dropped from 9 on 06/01/22 to 7.5.  need to r/o occult GI bleed  Metabolic bone disease -  continue with home meds and low phos diet  Nutrition -  renal diet, carb modified  Unintentional weight loss - he cannot quantify, but admits to poor po intake and weight loss.  Denies any N/V/D, dysgeusia.    Irena Cords, MD [facility], [facility] 06/05/2022, 12:45 PM

## 2022-06-05 NOTE — H&P (Addendum)
Date: 06/05/2022               Patient Name:  Justin [name] MRN: [id number]  DOB: July 29, 1950 Age / Sex: 72 y.o., male   PCP: Adrian Prince, MD         Medical Service: Internal Medicine Teaching Service         Attending Physician: Dr. Earl Lagos, MD      First Contact: Dr. Modena Slater, DO Pager [phone]    Second Contact: Dr. Elza Rafter, DO Pager [phone]         After Hours (After 5p/  First Contact Pager: [phone]  weekends / holidays): Second Contact Pager: [phone]   SUBJECTIVE   Chief Complaint: SOB and cough  History of Present Illness: Justin [name] is a 72 yo male with ESRD on HD TThS, HTN, IDDM, HFrecEF (EF 60-65% 04/2022), and HLD who presents with SOB.  Last Tuesday (5/28) the patient developed a dry cough and started to feel sick - he was advised to seek further care after his HD session, but he did not go to an urgent care until today. Last Friday (5/31), he also started to develop some nasal congestion, in addition to the cough. He went to dialysis as scheduled on Saturday (6/1), although he was still not feeling well. He has not cut any HD sessions short and reports only missing HD on Jun 13, 2022, as his father passed away.   This morning, the patient's girlfriend forced him to go to the urgent care due to ongoing cough, congestion, and shortness of breath. The patient states that his shortness of breath is only on exertion and is not present at rest. He has slept with 3 pillows the last 3-4 days, which his increased from 2 pillows at baseline. His cough is occasionally productive of a yellow-brown sputum, but other times is dry.   Currently, the patient states that his congestion and SOB are improved, since he got a breathing treatment from the urgent care before coming to the ED. He was sent to the ED from UC, as he was hypoxic in the 80s on room air.   The patient denies fevers, chills, chest pain, abd pain, n/v/d, leg swelling, weakness, or any sick contacts. He also  denies any hematochezia, melena, hematuria, hemoptysis.  He denies any use of ibuprofen, naproxen, BC powders, Goody powders.  He denies any history of GI bleeds.  He does state that last Tuesday, after HD, he had profuse bleeding from his AVF that required compression for a little while until it stopped. The patient's girlfriend states he had significant blood loss from this episode.  Additionally, the patient also endorses poor appetite for the last 6 months. He thinks he has lost weight, as he had to buy a new belt, but he notes that his weight does fluctuate with dialysis. His appetite has been poor because he does not feel hungry and gets full quickly. Of note, he had a colonoscopy about 1 year ago and it was reportedly normal.   ED Course: Patient initially arrived to the emergency room with SpO2 of 88 on room air.  Patient was given 3 L nasal cannula which increased O2 to 93%.  Patient will show blood pressure 139/55.  Patient did have venous blood gas showing pH 7.44, pCO2 40, pO2 54.  Patient did have chest x-ray concerning for venous congestion or mild pulmonary edema.  EDP concerned for infection gave the patient a dose of azithromycin and ceftriaxone.  EDP also consulted nephrology who saw the patient in the emergency department.  IMTS was consulted for further evaluation and management of acute hypoxemic respiratory failure.  Meds:  Amlodipine 10 mg Aspirin 81 mg Calcium acetate (Phoslo) 2668 mg daily -- not taking Hydralazine 25 mg- 2 tablets TID Imdur 30 mg daily on NON HD days Metoprolol 50 mg daily Nitro PRN Novolin 70/30 20u qhs Crestor 40 mg daily Ferric citrate 2 tablets TID  Vit B12  No outpatient medications have been marked as taking for the 06/05/22 encounter [facility] Encounter).    Past Medical History  Past Surgical History:  Procedure Laterality Date   AV FISTULA PLACEMENT Left 04/30/2018   Procedure: CREATION OF RADIOCEPHALIC ARTERIOVENOUS FISTULA LEFT ARM;   Surgeon: Sherren Kerns, MD;  Location: [facility] OR;  Service: Vascular;  Laterality: Left;   AV FISTULA PLACEMENT Left 08/28/2018   Procedure: ARTERIOVENOUS (AV) BRACHIOCEPHALIC FISTULA CREATION LEFT UPPER ARM;  Surgeon: Cephus Shelling, MD;  Location: [facility] OR;  Service: Vascular;  Laterality: Left;   IR FLUORO GUIDE CV LINE RIGHT  04/29/2018   IR US GUIDE VASC ACCESS RIGHT  04/29/2018   RIGHT/LEFT HEART CATH AND CORONARY ANGIOGRAPHY N/A 05/01/2018   Procedure: RIGHT/LEFT HEART CATH AND CORONARY ANGIOGRAPHY;  Surgeon: Lyn Records, MD;  Location: [facility] INVASIVE CV LAB;  Service: Cardiovascular;  Laterality: N/A;   subdural hematoma Right 07/2020    Social:  Lives With: girlfriend, Lynden Ang Occupation: Furniture conservator/restorer  Support: 2 brothers ([place] and [place]cow) Level of Function: Independent of ADL and iADLs PCP: Dr. Ezzie Dural ([facility]) Substances: No tobacco, no alcohol (previously drank beer in college), no other substance use.   Family History:  HTN, stroke, diabetes (paternal and maternal side)  Allergies: Allergies as of 06/05/2022   (No Known Allergies)    Review of Systems: A complete ROS was negative except as per HPI.   OBJECTIVE:   Physical Exam: Blood pressure (!) 135/55, pulse 87, temperature 98.7 F (37.1 C), temperature source Oral, resp. rate (!) 22, height 5\' 9"  (1.753 m), weight 82.6 kg, SpO2 93 %.   Constitutional: well-appearing elderly male laying in bed, in no acute distress HENT: normocephalic atraumatic, mucous membranes moist Eyes: conjunctival pallor Cardiovascular: regular rate and rhythm, systolic murmur appreciated. No JVD or LE edema.  Pulmonary/Chest: normal work of breathing on 1-3 L [place], crackles in bilateral lung bases Abdominal: soft, non-tender, non-distended MSK: normal bulk and tone. RUE AVF with palpable thrill, no acute bleeding appreciated  Neurological: alert & oriented x 3, no focal deficit Skin: warm and dry Psych: normal mood  and affect  Labs: CBC    Component Value Date/Time   WBC 8.1 06/05/2022 1010   RBC 2.58 (L) 06/05/2022 1010   HGB 7.5 (L) 06/05/2022 1017   HGB 7.8 (L) 06/05/2022 1017   HCT 22.0 (L) 06/05/2022 1017   HCT 23.0 (L) 06/05/2022 1017   PLT 181 06/05/2022 1010   MCV 88.8 06/05/2022 1010   MCH 29.8 06/05/2022 1010   MCHC 33.6 06/05/2022 1010   RDW 14.3 06/05/2022 1010   LYMPHSABS 0.5 (L) 06/05/2022 1010   MONOABS 0.8 06/05/2022 1010   EOSABS 0.0 06/05/2022 1010   BASOSABS 0.0 06/05/2022 1010     CMP     Component Value Date/Time   NA 134 (L) 06/05/2022 1017   NA 134 (L) 06/05/2022 1017   K 3.9 06/05/2022 1017   K 3.9 06/05/2022 1017   CL 93 (L) 06/05/2022 1017  CO2 27 06/05/2022 1010   GLUCOSE 192 (H) 06/05/2022 1017   BUN 47 (H) 06/05/2022 1017   CREATININE 10.10 (H) 06/05/2022 1017   CALCIUM 8.8 (L) 06/05/2022 1010   CALCIUM 7.9 (L) 04/29/2018 1350   PROT 6.5 06/05/2022 1010   PROT 6.8 06/05/2019 1639   ALBUMIN 3.2 (L) 06/05/2022 1010   ALBUMIN 4.5 06/05/2019 1639   AST 22 06/05/2022 1010   ALT 27 06/05/2022 1010   ALKPHOS 67 06/05/2022 1010   BILITOT 1.6 (H) 06/05/2022 1010   BILITOT 0.6 06/05/2019 1639   GFRNONAA 6 (L) 06/05/2022 1010   GFRAA 6 (L) 01/13/2019 0324    Imaging: CXR: pulmonary vascular congestion/pulmonary edema   EKG: personally reviewed my interpretation is normal sinus rhythm, 1st degree AV block (prolonged PR). Prior EKG   ASSESSMENT & PLAN:   Assessment & Plan by Problem: Principal Problem:   Acute hypoxic respiratory failure (HCC) Active Problems:   End stage renal disease (HCC)   Type 2 diabetes mellitus with chronic kidney disease on chronic dialysis, with long-term current use of insulin (HCC)   Hyperlipidemia   Primary hypertension   Anemia in chronic kidney disease   Justin [name] is a 72 y.o. person living with a history of hypertension, ESRD on dialysis Tuesday, Thursday, Saturday, HFrecEF, type 2 diabetes who presents  with concerns of shortness of breath.  Patient found to have acute hypoxemic respiratory failure requiring supplemental oxygen and admitted for further evaluation and management.   #Acute hypoxemic respiratory failure Patient presents with concerns of shortness of breath requiring supplemental oxygen.  Patient did have history of cough that started 6 days ago.  He has not improved since, and the cough has become productive as well now.  Patient also had history of profuse bleeding from AV fistula 6 days ago.  On exam, patient does have a lung sounds concerning for pulmonary edema.  He is currently on 3 L of oxygen nasal cannula.  Imaging revealing concern for pulmonary edema.  I do not think this is ACS, PE, pneumonia, or pneumothorax.  Given history and supporting evidence, likely respiratory failure is multifactorial in the setting of suspected URI, volume overload, as well as acute on chronic anemia. Will manage with breathing treatments as well as dialysis.  Will continue to monitor CBC.  Will stop antibiotics as I do not think this is infectious in nature. -DuoNebs every 6 hours -Continue to monitor SpO2 to keep greater than 92% -Monitor CBC -Discontinue antibiotics for now, if patient has fever, can start antibiotics  #Acute on chronic anemia Patient baseline hemoglobin around 9.3-10.  Patient presents with hemoglobin of 7.7 today.  Given history, I likely think patient had a good amount of blood loss 6 days ago through his AV fistula.  Likely, patient is having trouble with making up for this loss.  I do not think patient is currently having an active bleed.  I do not think this is of GI etiology.  However, if patient does start having any signs of bleeding overnight, would like to get a stat H&H.  Will check iron studies today. -Iron studies pending -CBC in the a.m. -If patient does start bleeding overnight, obtain stat H&H  #ESRD on HD Nephrology following.  Patient has not missed a dialysis  appointment this week.  Most recent dialysis appointment that he missed was on 05/27/2022.  Plan for dialysis tomorrow 06/06/2022. -Nephrology following -Dialysis on 06/06/2022 -Continue ferric citrate 420 mg twice daily  #Type 2  diabetes mellitus Patient has history of type 2 diabetes mellitus.  Most recent A1c showing 5.9 in January 2024.  Patient normally does 20 units insulin (70/30) nightly.  Will resume during hospitalization. -Continue CBG checks -Continue Novolin 20 units nightly -Sliding scale insulin -A1c pending   #HFrecEF Do not think patient currently is an acute exacerbation.  Patient's most recent echo on 04/12/2022 showing ejection fraction of 60 to 65%.  No JVD on exam.  No lower extremity edema on exam.  Will continue to monitor fluid status during hospitalization. -Continue monitor fluid status -HD tomorrow 06/06/22 -Continue metoprolol succinate 50 mg daily -Continue hydralazine 50 mg 3 times daily -Will hold Imdur 30 mg on nondialysis days  #Hypertension Patient has past medical history of hypertension.  Home medications include metoprolol succinate 50 mg daily, hydralazine 50 mg 3 times daily, Imdur 30 mg on nondialysis days.  Blood pressure measuring well during hospitalization. -Continue metoprolol succinate 50 mg daily -Continue hydralazine 50 mg 3 times daily -Hold Imdur 30 mg on nondialysis days  #Hyperlipidemia Patient has a past medical history of hyperlipidemia. -Continue Crestor 40 mg daily  Diet: Carb/Renal VTE: Heparin IVF: None,None Code: Full  Prior to Admission Living Arrangement: Home, living with girlfriend Anticipated Discharge Location: Home Barriers to Discharge: Clinical improvement and Dialysis, PT/OT evaluation.   Dispo: Admit patient to Observation with expected length of stay less than 2 midnights.  Signed: Modena Slater, DO Internal Medicine Resident PGY-1  06/05/2022, 2:44 PM   Dr. Modena Slater, DO Pager [phone]

## 2022-06-05 NOTE — Progress Notes (Signed)
Patient transferred from ED via bed at 1450 PM. Alert and oriented.

## 2022-06-05 NOTE — Progress Notes (Signed)
Pt receives out-pt HD at [facility]  on TTS. Will assist as needed.   Damon Hargrove Renal Navigator [phone] 

## 2022-06-06 DIAGNOSIS — J9601 Acute respiratory failure with hypoxia: Secondary | ICD-10-CM | POA: Diagnosis present

## 2022-06-06 DIAGNOSIS — R0600 Dyspnea, unspecified: Secondary | ICD-10-CM | POA: Diagnosis present

## 2022-06-06 DIAGNOSIS — N186 End stage renal disease: Secondary | ICD-10-CM | POA: Diagnosis present

## 2022-06-06 DIAGNOSIS — E1122 Type 2 diabetes mellitus with diabetic chronic kidney disease: Secondary | ICD-10-CM

## 2022-06-06 DIAGNOSIS — I5032 Chronic diastolic (congestive) heart failure: Secondary | ICD-10-CM | POA: Diagnosis present

## 2022-06-06 DIAGNOSIS — D631 Anemia in chronic kidney disease: Secondary | ICD-10-CM | POA: Diagnosis present

## 2022-06-06 DIAGNOSIS — I5020 Unspecified systolic (congestive) heart failure: Secondary | ICD-10-CM | POA: Diagnosis not present

## 2022-06-06 DIAGNOSIS — Z992 Dependence on renal dialysis: Secondary | ICD-10-CM

## 2022-06-06 DIAGNOSIS — D62 Acute posthemorrhagic anemia: Secondary | ICD-10-CM | POA: Diagnosis present

## 2022-06-06 DIAGNOSIS — Z7982 Long term (current) use of aspirin: Secondary | ICD-10-CM | POA: Diagnosis not present

## 2022-06-06 DIAGNOSIS — Z833 Family history of diabetes mellitus: Secondary | ICD-10-CM | POA: Diagnosis not present

## 2022-06-06 DIAGNOSIS — I5030 Unspecified diastolic (congestive) heart failure: Secondary | ICD-10-CM

## 2022-06-06 DIAGNOSIS — Z8249 Family history of ischemic heart disease and other diseases of the circulatory system: Secondary | ICD-10-CM | POA: Diagnosis not present

## 2022-06-06 DIAGNOSIS — Z79899 Other long term (current) drug therapy: Secondary | ICD-10-CM | POA: Diagnosis not present

## 2022-06-06 DIAGNOSIS — I132 Hypertensive heart and chronic kidney disease with heart failure and with stage 5 chronic kidney disease, or end stage renal disease: Secondary | ICD-10-CM

## 2022-06-06 DIAGNOSIS — E785 Hyperlipidemia, unspecified: Secondary | ICD-10-CM

## 2022-06-06 DIAGNOSIS — Z794 Long term (current) use of insulin: Secondary | ICD-10-CM | POA: Diagnosis not present

## 2022-06-06 DIAGNOSIS — R0602 Shortness of breath: Secondary | ICD-10-CM | POA: Diagnosis not present

## 2022-06-06 DIAGNOSIS — E8770 Fluid overload, unspecified: Secondary | ICD-10-CM | POA: Diagnosis present

## 2022-06-06 DIAGNOSIS — R634 Abnormal weight loss: Secondary | ICD-10-CM | POA: Diagnosis present

## 2022-06-06 DIAGNOSIS — I3139 Other pericardial effusion (noninflammatory): Secondary | ICD-10-CM | POA: Diagnosis present

## 2022-06-06 DIAGNOSIS — Z1152 Encounter for screening for COVID-19: Secondary | ICD-10-CM | POA: Diagnosis not present

## 2022-06-06 DIAGNOSIS — Z6823 Body mass index (BMI) 23.0-23.9, adult: Secondary | ICD-10-CM | POA: Diagnosis not present

## 2022-06-06 DIAGNOSIS — D649 Anemia, unspecified: Secondary | ICD-10-CM

## 2022-06-06 DIAGNOSIS — Z823 Family history of stroke: Secondary | ICD-10-CM | POA: Diagnosis not present

## 2022-06-06 DIAGNOSIS — M898X9 Other specified disorders of bone, unspecified site: Secondary | ICD-10-CM | POA: Diagnosis present

## 2022-06-06 LAB — TYPE AND SCREEN: ABO/RH(D): O POS

## 2022-06-06 LAB — CBC
HCT: 23.1 % — ABNORMAL LOW (ref 39.0–52.0)
HCT: 21 % — ABNORMAL LOW (ref 39.0–52.0)
Hemoglobin: 7.7 g/dL — ABNORMAL LOW (ref 13.0–17.0)
Hemoglobin: 7 g/dL — ABNORMAL LOW (ref 13.0–17.0)
MCH: 29.8 pg (ref 26.0–34.0)
MCH: 30.3 pg (ref 26.0–34.0)
MCHC: 33.3 g/dL (ref 30.0–36.0)
MCHC: 33.3 g/dL (ref 30.0–36.0)
MCV: 89.5 fL (ref 80.0–100.0)
MCV: 90.9 fL (ref 80.0–100.0)
Platelets: 206 10*3/uL (ref 150–400)
Platelets: 182 10*3/uL (ref 150–400)
RBC: 2.58 MIL/uL — ABNORMAL LOW (ref 4.22–5.81)
RBC: 2.31 MIL/uL — ABNORMAL LOW (ref 4.22–5.81)
RDW: 14.6 % (ref 11.5–15.5)
RDW: 14.5 % (ref 11.5–15.5)
WBC: 9 10*3/uL (ref 4.0–10.5)
WBC: 8.8 10*3/uL (ref 4.0–10.5)
nRBC: 0 % (ref 0.0–0.2)
nRBC: 0 % (ref 0.0–0.2)

## 2022-06-06 LAB — HEMOGLOBIN AND HEMATOCRIT, BLOOD
HCT: 25.3 % — ABNORMAL LOW (ref 39.0–52.0)
Hemoglobin: 9 g/dL — ABNORMAL LOW (ref 13.0–17.0)

## 2022-06-06 LAB — RENAL FUNCTION PANEL
Albumin: 3.1 g/dL — ABNORMAL LOW (ref 3.5–5.0)
Albumin: 2.8 g/dL — ABNORMAL LOW (ref 3.5–5.0)
Anion gap: 15 (ref 5–15)
Anion gap: 13 (ref 5–15)
BUN: 61 mg/dL — ABNORMAL HIGH (ref 8–23)
BUN: 59 mg/dL — ABNORMAL HIGH (ref 8–23)
CO2: 26 mmol/L (ref 22–32)
CO2: 26 mmol/L (ref 22–32)
Calcium: 8.4 mg/dL — ABNORMAL LOW (ref 8.9–10.3)
Calcium: 8.1 mg/dL — ABNORMAL LOW (ref 8.9–10.3)
Chloride: 92 mmol/L — ABNORMAL LOW (ref 98–111)
Chloride: 93 mmol/L — ABNORMAL LOW (ref 98–111)
Creatinine, Ser: 10.8 mg/dL — ABNORMAL HIGH (ref 0.61–1.24)
Creatinine, Ser: 10.49 mg/dL — ABNORMAL HIGH (ref 0.61–1.24)
GFR, Estimated: 5 mL/min — ABNORMAL LOW (ref >60)
GFR, Estimated: 5 mL/min — ABNORMAL LOW (ref >60)
Glucose, Bld: 130 mg/dL — ABNORMAL HIGH (ref 70–99)
Glucose, Bld: 142 mg/dL — ABNORMAL HIGH (ref 70–99)
Phosphorus: 4.5 mg/dL (ref 2.5–4.6)
Phosphorus: 4.4 mg/dL (ref 2.5–4.6)
Potassium: 3.6 mmol/L (ref 3.5–5.1)
Potassium: 3.6 mmol/L (ref 3.5–5.1)
Sodium: 133 mmol/L — ABNORMAL LOW (ref 135–145)
Sodium: 132 mmol/L — ABNORMAL LOW (ref 135–145)

## 2022-06-06 LAB — GLUCOSE, CAPILLARY
Glucose-Capillary: 124 mg/dL — ABNORMAL HIGH (ref 70–99)
Glucose-Capillary: 115 mg/dL — ABNORMAL HIGH (ref 70–99)
Glucose-Capillary: 130 mg/dL — ABNORMAL HIGH (ref 70–99)
Glucose-Capillary: 247 mg/dL — ABNORMAL HIGH (ref 70–99)
Glucose-Capillary: 138 mg/dL — ABNORMAL HIGH (ref 70–99)
Glucose-Capillary: 133 mg/dL — ABNORMAL HIGH (ref 70–99)

## 2022-06-06 LAB — IRON AND TIBC
Iron: 34 ug/dL — ABNORMAL LOW (ref 45–182)
Saturation Ratios: 17 % — ABNORMAL LOW (ref 17.9–39.5)
TIBC: 203 ug/dL — ABNORMAL LOW (ref 250–450)
UIBC: 169 ug/dL

## 2022-06-06 LAB — PREPARE RBC (CROSSMATCH)

## 2022-06-06 LAB — FERRITIN: Ferritin: 2652 ng/mL — ABNORMAL HIGH (ref 24–336)

## 2022-06-06 LAB — HEMOGLOBIN A1C
Hgb A1c MFr Bld: 6.4 % — ABNORMAL HIGH (ref 4.8–5.6)
Mean Plasma Glucose: 137 mg/dL

## 2022-06-06 LAB — BPAM RBC: ISSUE DATE / TIME: 202406041443

## 2022-06-06 LAB — HEPATITIS B SURFACE ANTIBODY, QUANTITATIVE: Hep B S AB Quant (Post): <3.5 m[IU]/mL — ABNORMAL LOW (ref >9.9)

## 2022-06-06 MED ORDER — ANTICOAGULANT SODIUM CITRATE 4% (200MG/5ML) IV SOLN: 5.0000 mL | Status: DC | PRN

## 2022-06-06 MED ORDER — PENTAFLUOROPROP-TETRAFLUOROETH EX AERO: 1.0000 | INHALATION_SPRAY | CUTANEOUS | Status: DC | PRN

## 2022-06-06 MED ORDER — ALTEPLASE 2 MG IJ SOLR: 2.0000 mg | Freq: Once | INTRAMUSCULAR | Status: DC | PRN

## 2022-06-06 MED ORDER — HEPARIN SODIUM (PORCINE) 1000 UNIT/ML DIALYSIS: 1000.0000 [IU] | INTRAMUSCULAR | Status: DC | PRN

## 2022-06-06 MED ORDER — LIDOCAINE HCL (PF) 1 % IJ SOLN: 5.0000 mL | INTRAMUSCULAR | Status: DC | PRN

## 2022-06-06 MED ORDER — ISOSORBIDE MONONITRATE ER 30 MG PO TB24
30.0000 mg | ORAL_TABLET | ORAL | Status: DC
  Administered 2022-06-07: 30 mg via ORAL
  Filled 2022-06-06: qty 1

## 2022-06-06 MED ORDER — SODIUM CHLORIDE 0.9% IV SOLUTION: Freq: Once | INTRAVENOUS | Status: AC

## 2022-06-06 MED ORDER — LIDOCAINE-PRILOCAINE 2.5-2.5 % EX CREA: 1.0000 | TOPICAL_CREAM | CUTANEOUS | Status: DC | PRN

## 2022-06-06 MED ORDER — IPRATROPIUM-ALBUTEROL 0.5-2.5 (3) MG/3ML IN SOLN
3.0000 mL | Freq: Three times a day (TID) | RESPIRATORY_TRACT | Status: DC
  Administered 2022-06-07 – 2022-06-08 (×3): 3 mL via RESPIRATORY_TRACT
  Filled 2022-06-06 (×3): qty 3

## 2022-06-06 MED ORDER — ALBUTEROL SULFATE (2.5 MG/3ML) 0.083% IN NEBU
INHALATION_SOLUTION | RESPIRATORY_TRACT | Status: AC
  Filled 2022-06-06: qty 3

## 2022-06-06 NOTE — Procedures (Signed)
I was present at this dialysis session. I have reviewed the session itself and made appropriate changes.   Vital signs in last 24 hours:  Temp:  [97.8 F (36.6 C)-99.6 F (37.6 C)] 99.5 F (37.5 C) (06/04 0816) Pulse Rate:  [57-87] 67 (06/04 0816) Resp:  [16-24] 24 (06/04 0816) BP: (121-153)/(45-55) 135/45 (06/04 0816) SpO2:  [83 %-100 %] 96 % (06/04 0819) Weight:  [82.6 kg] 82.6 kg (06/03 0959) Weight change:  Filed Weights   06/05/22 0959  Weight: 82.6 kg    Recent Labs  Lab 06/05/22 1010 06/05/22 1017  NA 137 134*  134*  K 3.9 3.9  3.9  CL 92* 93*  CO2 27  --   GLUCOSE 192* 192*  BUN 47* 47*  CREATININE 8.97* 10.10*  CALCIUM 8.8*  --     Recent Labs  Lab 06/05/22 1010 06/05/22 1017  WBC 8.1  --   NEUTROABS 6.7  --   HGB 7.7* 7.8*  7.5*  HCT 22.9* 23.0*  22.0*  MCV 88.8  --   PLT 181  --     Scheduled Meds:  amLODipine  10 mg Oral Daily   aspirin EC  81 mg Oral Daily   Chlorhexidine Gluconate Cloth  6 each Topical Q0600   ferric citrate  420 mg Oral TID WC   heparin  5,000 Units Subcutaneous Q8H   hydrALAZINE  50 mg Oral TID   insulin aspart  0-5 Units Subcutaneous QHS   insulin aspart  0-6 Units Subcutaneous TID WC   insulin aspart protamine- aspart  20 Units Subcutaneous QHS   ipratropium-albuterol  3 mL Nebulization TID   [START ON 06/07/2022] isosorbide mononitrate  30 mg Oral Once per day on Sun Mon Wed Fri   metoprolol succinate  50 mg Oral Daily   rosuvastatin  40 mg Oral QHS   Continuous Infusions:  anticoagulant sodium citrate     PRN Meds:.acetaminophen **OR** acetaminophen, alteplase, anticoagulant sodium citrate, heparin, lidocaine (PF), lidocaine-prilocaine, ondansetron **OR** ondansetron (ZOFRAN) IV, pentafluoroprop-tetrafluoroeth, polyethylene glycol   Irena Cords,  MD 06/06/2022, 8:36 AM

## 2022-06-06 NOTE — Progress Notes (Signed)
Received patient in bed.Awake ,alert and oriented x 4. He has an oxygen at 2 lpm/[place] and yet her oxygen saturation was 83%.Titrated oxygen at 5 lpm and he was at 88%  Access used :Left upper arm AVF that worked well.  Duration of treatment 3.75 hours.  Fluid removed  Achieved 3.5 liters prescribed goal.  Hemo comment:Tolerating treatment well posterior oxygen treatment oxygen level was 94% at 2 lpm/[place].       Was not able to transfused one unit of blood for the patient treatment remaining time was 45 minutes.  Hand off to the patient's nurse.

## 2022-06-06 NOTE — Progress Notes (Signed)
HD#0 Subjective:   Summary: Justin [name] is a 72 y.o. person living with a history of hypertension, ESRD on dialysis Tuesday, Thursday, Saturday, HFrecEF, type 2 diabetes who presents with concerns of shortness of breath.  Patient found to have acute hypoxemic respiratory failure requiring supplemental oxygen and admitted for further evaluation and management.   Overnight Events: No overnight events   Patient evaluated at bedside this AM.  Patient states he is doing well.  He is on dialysis at this time.  He denies any shortness of breath or chest pain.  Denies any leg swelling.  He states he is doing well.  He states he has been eating fine.  He states having a bowel movement that was soft, but normal.  Objective:  Vital signs in last 24 hours: Vitals:   06/06/22 0900 06/06/22 0931 06/06/22 1000 06/06/22 1034  BP: 135/78 (!) 144/55 (!) 143/47 (!) 151/47  Pulse: 64 61 61 63  Resp: (!) 31 (!) 29 (!) 30 (!) 32  Temp:      TempSrc:      SpO2: 93% (!) 89% 90% 90%  Weight:      Height:       Supplemental O2: Bag Valve Mask SpO2: 90 % O2 Flow Rate (L/min): 3 L/min   Physical Exam:  Constitutional: Resting in bed, no acute distress HENT: normocephalic atraumatic, mucous membranes moist Eyes: conjunctiva non-erythematous Cardiovascular: Regular rate and rhythm, grade 2/6 systolic murmur noted best heard at right sternal border Pulmonary/Chest: Coarse breath sounds heard at bilateral lung bases with some crackles noted Extremities: No edema appreciated Abdomen: Soft, nontender, no rebound or guarding, normoactive bowel sounds  Filed Weights   06/05/22 0959  Weight: 82.6 kg     Intake/Output Summary (Last 24 hours) at 06/06/2022 1106 Last data filed at 06/05/2022 1844 Gross per 24 hour  Intake 237 ml  Output 100 ml  Net 137 ml   Net IO Since Admission: 137 mL [06/06/22 1106]  Pertinent Labs:    Latest Ref Rng & Units 06/06/2022    9:46 AM 06/06/2022    7:37 AM 06/05/2022    10:17 AM  CBC  WBC 4.0 - 10.5 K/uL 8.8  9.0    Hemoglobin 13.0 - 17.0 g/dL 7.0  7.7  7.5    7.8   Hematocrit 39.0 - 52.0 % 21.0  23.1  22.0    23.0   Platelets 150 - 400 K/uL 182  206         Latest Ref Rng & Units 06/06/2022    9:45 AM 06/06/2022    7:37 AM 06/05/2022   10:17 AM  CMP  Glucose 70 - 99 mg/dL 161  096  045   BUN 8 - 23 mg/dL 59  61  47   Creatinine 0.61 - 1.24 mg/dL 40.98  11.91  47.82   Sodium 135 - 145 mmol/L 132  133  134    134   Potassium 3.5 - 5.1 mmol/L 3.6  3.6  3.9    3.9   Chloride 98 - 111 mmol/L 93  92  93   CO2 22 - 32 mmol/L 26  26    Calcium 8.9 - 10.3 mg/dL 8.1  8.4      Imaging: No results found.  Assessment/Plan:   Principal Problem:   Acute hypoxic respiratory failure (HCC) Active Problems:   End stage renal disease (HCC)   Type 2 diabetes mellitus with chronic kidney disease on chronic dialysis,  with long-term current use of insulin (HCC)   Hyperlipidemia   Primary hypertension   Anemia in chronic kidney disease   Patient Summary: Justin [name] is a 72 y.o. person living with a history of hypertension, ESRD on dialysis Tuesday, Thursday, Saturday, HFrecEF, type 2 diabetes who presents with concerns of shortness of breath.  Patient found to have acute hypoxemic respiratory failure requiring supplemental oxygen and admitted for further evaluation and management.    #Acute hypoxemic respiratory failure Patient still requiring 2 L nasal cannula.  Patient denies any shortness of breath on exam.  Lung exam with coarse breath sounds noted at bilateral lung bases as well as crackles.  Patient mains afebrile.  I still do not think this is a bacterial pneumonia.  Patient does note that he has been losing weight.  This likely is secondary to volume overload.  This likely is due to the fact that patient has been losing weight, and has not established a new dry weight.  Nephrology following who is going to challenge his dry weight.  This will likely help  patient's respiratory status.  Will continue with breathing treatment for now.  -DuoNebs every 6 hours -Continue to monitor SpO2 to keep greater than 90% -Monitor CBC -HD for fluid overload   #Acute on chronic anemia Patient baseline hemoglobin around 9.3-10.  Hemoglobin today 7.0.  Patient reports she is having dark stools, but this is secondary to ferric citrate.  He denies any worsening of the stools.  Denies any hematochezia, hemoptysis, hematemesis.  Patient still requiring oxygen.  Will need to administer blood today.  Ferritin 2652.  Iron 34, TIBC 203.  Acute on chronic anemia likely secondary to some blood loss from AV fistula. If, patient continues to drop his hemoglobin, will need GI evaluation.  Patient did have a colonoscopy 1 year ago which was normal.   -Administer blood today -Trend CBC -If hemoglobin continues to drop, will need GI evaluation   #ESRD on HD Nephrology following.  Wonder if patient has new estimated dry weight given patient has been dropping weight.  Nephrology to challenge his dry weight. -HD today -Continue with ferric citrate 4 and 20 mg twice daily -Nephrology following -HD Tuesday, Thursday, Saturday    #Type 2 diabetes mellitus A1c today 6.4.  Will continue with current management.   -Continue CBG checks -Continue Novolin 20 units nightly -Sliding scale insulin   #HFrecEF Patient to get dialysis today for volume overload.  Patient does not seem to be in current exacerbation.  On exam, no pitting edema to bilateral lower extremities.  -Continue monitor fluid status -HD today -Continue metoprolol succinate 50 mg daily -Continue hydralazine 50 mg 3 times daily -Continue Imdur 30 mg on nondialysis days   #Hypertension Blood pressure measuring well during hospitalization.  Will continue current management. -Continue metoprolol succinate 50 mg daily -Continue hydralazine 50 mg 3 times daily -Continue Imdur 30 mg on nondialysis days    #Hyperlipidemia Patient has a past medical history of hyperlipidemia. -Continue Crestor 40 mg daily  Diet: Carb/Renal IVF: None,None VTE: Heparin Code: Full PT/OT recs: None, none.  Dispo: Anticipated discharge to Home in 1 days pending clinical improvement.   Modena Slater DO Internal Medicine Resident PGY-1 [phone] Please contact the on call pager after 5 pm and on weekends at [phone].

## 2022-06-06 NOTE — Progress Notes (Signed)
Internal Medicine Attending:   I saw and examined the patient. I reviewed the resident's H&P note and I agree with the resident's findings and plan as documented in the resident's note.  In brief, patient is a 72 year old male with a past medical history of ESRD on hemodialysis, hypertension, insulin-dependent diabetes, heart failure with recovered EF of 60 to 65% and hyperlipidemia presented to the ED with worsening shortness of breath over the last week.  Patient has been compliant with his hemodialysis sessions and has not gotten any hemodialysis sessions short.  However, he does note shortness of breath as well as orthopnea requiring 3 pillows over the last 3 to 4 days and cough occasionally productive of yellow phlegm.  He does state that his appetite has been poor over the last 6 months and he has lost weight requiring him to buy a new belt.  He also had an incident last week where he had significant bleeding from his AV fistula requiring compression until stopped.  He went to urgent care and was noted to be hypoxic and was referred to the ED for further evaluation.  In the ED, patient had a chest x-ray concerning for pulmonary edema as well as an elevated BNP and was admitted for likely fluid overload.  Today, patient was seen at hemodialysis and states that his shortness of breath has improved.  He denies any other complaints currently.  On exam, patient is lying in bed in no apparent distress.  Cardiovascular exam reveals regular rate and rhythm with a grade 2 systolic murmur.  Lungs with bibasilar crackles noted on exam.  Abdomen is soft, nontender, nondistended with normoactive bowel sounds.  Lower extremities are nontender to palpation with no edema noted.  Patient has a flat affect.  Assessment and plan:  1.  Acute hypoxic respiratory failure secondary to volume overload: -Patient presented to ED with progressively worsening shortness of breath and dyspnea on exertion with associated  orthopnea and cough over the last week.  Patient has not missed any hemodialysis sessions and has not cut any hemodialysis sessions short.  Patient was noted to be hypoxic with O2 sats in the 80s on room air and had a chest x-ray and elevated BNP were consistent with fluid overload.  The etiology behind his fluid overload remains uncertain at this time.  However, patient has had a poor appetite over the last 6 months and states that he needed to buy a new belt.  I suspect that he may be losing weight and is still being dialyzed to the same dry weight and may require a lower dry weight to be dialyzed to. -Nephrology follow-up and recommendations appreciated -Will continue with O2 via nasal cannula as needed.  Will attempt to wean to room air today after his hemodialysis session -No evidence of underlying infection at this time. -Will continue with hydralazine, Imdur and beta-blocker for his heart failure with recovered ejection fraction.  I will check a 2D echo to rule out any change in his EF or wall motion abnormalities which could have precipitated a heart failure exacerbation. -Patient is status post hemodialysis today with removal of 3.5 L of fluid.  Will continue to monitor closely. -No further workup for now  2.  Acute blood loss anemia: -Patient does have a history of chronic anemia secondary to his ESRD with baseline hemoglobin of 9.3 and 10.  His hemoglobin today 7.  I suspect some of this is secondary to significant bleeding from his AV fistula last week.  -  Will transfuse 1 unit PRBC today - If patient's hemoglobin continues to decrease we will need to rule out an occult GI bleed as an inpatient.  If his hemoglobin stabilizes will refer to GI for follow-up as an outpatient.

## 2022-06-07 ENCOUNTER — Other Ambulatory Visit (HOSPITAL_COMMUNITY): Payer: Medicare HMO

## 2022-06-07 DIAGNOSIS — J9601 Acute respiratory failure with hypoxia: Secondary | ICD-10-CM | POA: Diagnosis not present

## 2022-06-07 DIAGNOSIS — D649 Anemia, unspecified: Secondary | ICD-10-CM | POA: Diagnosis not present

## 2022-06-07 DIAGNOSIS — I5020 Unspecified systolic (congestive) heart failure: Secondary | ICD-10-CM | POA: Diagnosis not present

## 2022-06-07 DIAGNOSIS — I132 Hypertensive heart and chronic kidney disease with heart failure and with stage 5 chronic kidney disease, or end stage renal disease: Secondary | ICD-10-CM | POA: Diagnosis not present

## 2022-06-07 LAB — CBC
HCT: 26.6 % — ABNORMAL LOW (ref 39.0–52.0)
Hemoglobin: 9.3 g/dL — ABNORMAL LOW (ref 13.0–17.0)
MCH: 30.7 pg (ref 26.0–34.0)
MCHC: 35 g/dL (ref 30.0–36.0)
MCV: 87.8 fL (ref 80.0–100.0)
Platelets: 219 10*3/uL (ref 150–400)
RBC: 3.03 MIL/uL — ABNORMAL LOW (ref 4.22–5.81)
RDW: 14.1 % (ref 11.5–15.5)
WBC: 6.9 10*3/uL (ref 4.0–10.5)
nRBC: 0 % (ref 0.0–0.2)

## 2022-06-07 LAB — TYPE AND SCREEN
Antibody Screen: NEGATIVE
Unit division: 0

## 2022-06-07 LAB — CULTURE, BLOOD (ROUTINE X 2): Culture: NO GROWTH

## 2022-06-07 LAB — RENAL FUNCTION PANEL
Albumin: 2.7 g/dL — ABNORMAL LOW (ref 3.5–5.0)
Anion gap: 14 (ref 5–15)
BUN: 35 mg/dL — ABNORMAL HIGH (ref 8–23)
CO2: 28 mmol/L (ref 22–32)
Calcium: 8.3 mg/dL — ABNORMAL LOW (ref 8.9–10.3)
Chloride: 95 mmol/L — ABNORMAL LOW (ref 98–111)
Creatinine, Ser: 7.09 mg/dL — ABNORMAL HIGH (ref 0.61–1.24)
GFR, Estimated: 8 mL/min — ABNORMAL LOW (ref >60)
Glucose, Bld: 139 mg/dL — ABNORMAL HIGH (ref 70–99)
Phosphorus: 4.4 mg/dL (ref 2.5–4.6)
Potassium: 3.3 mmol/L — ABNORMAL LOW (ref 3.5–5.1)
Sodium: 137 mmol/L (ref 135–145)

## 2022-06-07 LAB — GLUCOSE, CAPILLARY
Glucose-Capillary: 121 mg/dL — ABNORMAL HIGH (ref 70–99)
Glucose-Capillary: 141 mg/dL — ABNORMAL HIGH (ref 70–99)
Glucose-Capillary: 158 mg/dL — ABNORMAL HIGH (ref 70–99)
Glucose-Capillary: 279 mg/dL — ABNORMAL HIGH (ref 70–99)

## 2022-06-07 LAB — BPAM RBC
Blood Product Expiration Date: 202407042359
Unit Type and Rh: 5100

## 2022-06-07 NOTE — Progress Notes (Signed)
HD#1 Subjective:   Summary: Justin [name] is a 72 y.o. person living with a history of hypertension, ESRD on dialysis Tuesday, Thursday, Saturday, HFrecEF, type 2 diabetes who presents with concerns of shortness of breath.  Patient found to have acute hypoxemic respiratory failure requiring supplemental oxygen and admitted for further evaluation and management.   Overnight Events: No overnight events   Patient evaluated bedside this morning.  Patient denies any shortness of breath or chest pain.  He states he is eating and drinking well.  He has been up and walking with no concerns.  He has no complaints this morning.  Discussed plan with patient.  Objective:  Vital signs in last 24 hours: Vitals:   06/07/22 0536 06/07/22 0808 06/07/22 0829 06/07/22 0915  BP: (!) 151/56  (!) 138/49 111/74  Pulse: (!) 59  65 72  Resp: 19  15   Temp: 98.1 F (36.7 C)  97.8 F (36.6 C)   TempSrc: Oral  Oral   SpO2: 96% 96% 93%   Weight:      Height:       Supplemental O2: Bag Valve Mask SpO2: 93 % O2 Flow Rate (L/min): 2 L/min   Physical Exam:  Constitutional: Resting in bed, no acute distress, nasal cannula in place HENT: normocephalic atraumatic, mucous membranes moist Eyes: conjunctiva non-erythematous Cardiovascular: Regular rate and rhythm, grade 2/6 systolic murmur noted best heard at right sternal border Pulmonary/Chest: Bilateral crackles noted to lung bases, improved from previous exam Extremities: No edema appreciated Abdomen: Soft, nontender, no rebound or guarding, normoactive bowel sounds  Filed Weights   06/05/22 0959 06/06/22 1254  Weight: 82.6 kg 79.9 kg     Intake/Output Summary (Last 24 hours) at 06/07/2022 1020 Last data filed at 06/06/2022 1813 Gross per 24 hour  Intake 494 ml  Output 3500 ml  Net -3006 ml   Net IO Since Admission: -2,869 mL [06/07/22 1020]  Pertinent Labs:    Latest Ref Rng & Units 06/07/2022    8:04 AM 06/06/2022    8:09 PM 06/06/2022    9:46  AM  CBC  WBC 4.0 - 10.5 K/uL 6.9   8.8   Hemoglobin 13.0 - 17.0 g/dL 9.3  9.0  7.0   Hematocrit 39.0 - 52.0 % 26.6  25.3  21.0   Platelets 150 - 400 K/uL 219   182        Latest Ref Rng & Units 06/07/2022    8:04 AM 06/06/2022    9:45 AM 06/06/2022    7:37 AM  CMP  Glucose 70 - 99 mg/dL 657  846  962   BUN 8 - 23 mg/dL 35  59  61   Creatinine 0.61 - 1.24 mg/dL 9.52  84.13  24.40   Sodium 135 - 145 mmol/L 137  132  133   Potassium 3.5 - 5.1 mmol/L 3.3  3.6  3.6   Chloride 98 - 111 mmol/L 95  93  92   CO2 22 - 32 mmol/L 28  26  26    Calcium 8.9 - 10.3 mg/dL 8.3  8.1  8.4     Imaging: No results found.  Assessment/Plan:   Principal Problem:   Acute hypoxic respiratory failure (HCC) Active Problems:   End stage renal disease (HCC)   Type 2 diabetes mellitus with chronic kidney disease on chronic dialysis, with long-term current use of insulin (HCC)   Hyperlipidemia   Primary hypertension   Anemia in chronic kidney disease  Patient Summary: Justin [name] is a 72 y.o. person living with a history of hypertension, ESRD on dialysis Tuesday, Thursday, Saturday, HFrecEF, type 2 diabetes who presents with concerns of shortness of breath.  Patient found to have acute hypoxemic respiratory failure requiring supplemental oxygen and admitted for further evaluation and management.    #Acute hypoxemic respiratory failure, improving At times during hospital day yesterday, patient has been decreased to 0.5 L oxygen and saturated well.  HD as well as blood transfusion has helped patient tremendously.  Patient also reports breathing treatments helping.  Patient is not short of breath, and lung sounds are improving.  Patient was ambulated today and was able to maintain saturations while ambulating on room air.  Patient did require 1 L once he was supine, making me think this is more related to fluid overload.  Patient did show me his sputum cup which did have some pink-tinged sputum, consistent with  fluid overload.  Will continue with hemodialysis for fluid management.  Given patient has fluid overload, would like to obtain echocardiogram to evaluate heart pump function.  -DuoNebs every 6 hours -Continue to monitor SpO2 to keep greater than 90% -Monitor CBC -Nephrology to take patient for short session of dialysis with UF today -Echo pending -Continue to wean oxygen as tolerated   #Acute on chronic anemia, improving Patient is status post transfusion 1 unit packed red blood cells.  Hemoglobin today 9.3.  Patient is asymptomatic at this time.  Will continue to monitor CBC.  Likely this is from blood loss from AV fistula, which his body did not tolerate well.  Given history of ESRD, patient has chronic anemia of CKD.  -Monitor CBC -Patient to follow-up with GI outpatient  #ESRD on HD Nephrology following.  Patient had dialysis yesterday with 3.5 L off.  Weight went from 82.6 kg to 79.9 kg.  Dry weight seems to be at 77.9 kg.  Nephrology to schedule short session of dialysis today to challenge his dry weight. -HD today for short session with UF -Continue with ferric citrate 420 mg twice daily -Nephrology following -HD Tuesday, Thursday, Saturday    #Type 2 diabetes mellitus CBGs measuring well during hospitalization. -Continue CBG checks -Continue Novolin 20 units nightly -Sliding scale insulin   #HFrecEF Patient to get another short session of hemodialysis with UF today.  Will obtain echocardiogram to evaluate heart function. -Continue monitor fluid status -HD today -Continue metoprolol succinate 50 mg daily -Continue hydralazine 50 mg 3 times daily -Continue Imdur 30 mg on nondialysis days -Echocardiogram pending   #Hypertension Blood pressure measuring well during hospitalization.  Will continue current management. -Continue metoprolol succinate 50 mg daily -Continue hydralazine 50 mg 3 times daily -Continue Imdur 30 mg on nondialysis days   #Hyperlipidemia Patient has  a past medical history of hyperlipidemia. -Continue Crestor 40 mg daily  Diet: Carb/Renal IVF: None,None VTE: Heparin Code: Full PT/OT recs: None, none.  Dispo: Anticipated discharge to Home in 1 days pending clinical improvement.   Modena Slater DO Internal Medicine Resident PGY-1 [phone] Please contact the on call pager after 5 pm and on weekends at [phone].

## 2022-06-07 NOTE — Evaluation (Signed)
Physical Therapy Evaluation Patient Details Name: Justin [name] MRN: [id number] DOB: [birth year] Today's Date: 06/07/2022  History of Present Illness  72 yo male [facility] on 6/[facility] with SOB with hypoxia, cough. CXR with cardiomegaly and prominent bilateral interstitial opacities, favoring pulmonary venous congestion or mild pulmonary edema. PMH includes ESRD on HD TTS, HTN, DM, CHF, HLD, HTN.  Clinical Impression   Pt presents with generalized weakness, impaired balance likely chronic, min dyspnea on exertion, and decreased activity tolerance vs baseline. Pt to benefit from acute PT to address deficits. Pt ambulated hallway distance without AD and supervision for safety, pt with slowed gait and min dyspnea but SPO2 maintained 91% and greater on RA during gait. Once pt returned to supine, SpO2 dropped to 86% so placed back on 1LO2 with RN present. PT to progress mobility as tolerated, and will continue to follow acutely.         Recommendations for follow up therapy are one component of a multi-disciplinary discharge planning process, led by the attending physician.  Recommendations may be updated based on patient status, additional functional criteria and insurance authorization.  Follow Up Recommendations       Assistance Recommended at Discharge PRN  Patient can return home with the following  A little help with walking and/or transfers    Equipment Recommendations None recommended by PT  Recommendations for Other Services       Functional Status Assessment Patient has had a recent decline in their functional status and demonstrates the ability to make significant improvements in function in a reasonable and predictable amount of time.     Precautions / Restrictions Precautions Precautions: Other (comment);Fall Precaution Comments: sats Restrictions Weight Bearing Restrictions: No      Mobility  Bed Mobility Overal bed mobility: Needs Assistance Bed Mobility: Supine to  Sit, Sit to Supine     Supine to sit: Min assist, HOB elevated Sit to supine: Supervision, HOB elevated   General bed mobility comments: assist for trunk elevation off of bed    Transfers Overall transfer level: Needs assistance Equipment used: None Transfers: Sit to/from Stand Sit to Stand: Supervision           General transfer comment: for safety, min unsteadiness upon initial standing but pt corrected    Ambulation/Gait Ambulation/Gait assistance: Supervision Gait Distance (Feet): 120 Feet Assistive device: None Gait Pattern/deviations: Step-through pattern, Decreased stride length, Trunk flexed Gait velocity: decr     General Gait Details: for safety, min unsteadiness and stiffness of gait anticipate this is chronic and pt managed. SPO2 91% and greater on RA  Stairs            Wheelchair Mobility    Modified Rankin (Stroke Patients Only)       Balance Overall balance assessment: Mild deficits observed, not formally tested                                           Pertinent Vitals/Pain Pain Assessment Pain Assessment: No/denies pain    Home Living Family/patient expects to be discharged to:: Private residence Living Arrangements: Spouse/significant other Available Help at Discharge: Family Type of Home: House Home Access: Stairs to enter   Secretary/administrator of Steps: 1   Home Layout: One level Home Equipment: None      Prior Function Prior Level of Function : Independent/Modified Independent  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   Dominant Hand: Right    Extremity/Trunk Assessment   Upper Extremity Assessment Upper Extremity Assessment: Overall WFL for tasks assessed    Lower Extremity Assessment Lower Extremity Assessment: Generalized weakness    Cervical / Trunk Assessment Cervical / Trunk Assessment: Normal  Communication   Communication: No difficulties   Cognition Arousal/Alertness: Awake/alert Behavior During Therapy: WFL for tasks assessed/performed Overall Cognitive Status: Within Functional Limits for tasks assessed                                          General Comments      Exercises     Assessment/Plan    PT Assessment Patient needs continued PT services  PT Problem List Decreased mobility;Decreased strength;Decreased activity tolerance;Decreased balance;Cardiopulmonary status limiting activity       PT Treatment Interventions DME instruction;Therapeutic activities;Gait training;Therapeutic exercise;Patient/family education;Balance training;Stair training;Functional mobility training;Neuromuscular re-education    PT Goals (Current goals can be found in the Care Plan section)  Acute Rehab PT Goals Patient Stated Goal: home PT Goal Formulation: With patient Time For Goal Achievement: 06/21/22 Potential to Achieve Goals: Good    Frequency Min 3X/week     Co-evaluation               AM-PAC PT "6 Clicks" Mobility  Outcome Measure Help needed turning from your back to your side while in a flat bed without using bedrails?: A Little Help needed moving from lying on your back to sitting on the side of a flat bed without using bedrails?: A Little Help needed moving to and from a bed to a chair (including a wheelchair)?: A Little Help needed standing up from a chair using your arms (e.g., wheelchair or bedside chair)?: A Little Help needed to walk in hospital room?: A Little Help needed climbing 3-5 steps with a railing? : A Little 6 Click Score: 18    End of Session Equipment Utilized During Treatment: Oxygen (1LO2 donned at end of session given drop to 87% at rest) Activity Tolerance: Patient tolerated treatment well Patient left: in bed;with call bell/phone within reach;with bed alarm set;with family/visitor present;with nursing/sitter in room Nurse Communication: Mobility status PT Visit  Diagnosis: Other abnormalities of gait and mobility (R26.89)    Time: 7829-5621 PT Time Calculation (min) (ACUTE ONLY): 21 min   Charges:   PT Evaluation $PT Eval Low Complexity: 1 Low          Lupe Bonner S, PT DPT Acute Rehabilitation Services Secure Chat Preferred  Office [phone]   Albertina Leise Sheliah Plane 06/07/2022, 9:40 AM

## 2022-06-07 NOTE — Progress Notes (Signed)
Patient ID: Justin [name], male   DOB: 28-Jul-1950, 72 y.o.   MRN: [id number] S: Feeling better O:BP 111/74 (BP Location: Right Arm)   Pulse 72   Temp 97.8 F (36.6 C) (Oral)   Resp 15   Ht 5\' 9"  (1.753 m)   Wt 79.9 kg   SpO2 93%   BMI 26.01 kg/m   Intake/Output Summary (Last 24 hours) at 06/07/2022 0939 Last data filed at 06/06/2022 1813 Gross per 24 hour  Intake 494 ml  Output 3500 ml  Net -3006 ml   Intake/Output: I/O last 3 completed shifts: In: 494 [P.O.:118; Blood:376] Out: 3500 [Other:3500]  Intake/Output this shift:  No intake/output data recorded. Weight change: -2.655 kg Gen: NAD CVS: RRR  Resp:CTA Abd: +BS, soft,NT/ND Ext: no edema, LUE AVF +T/B  Recent Labs  Lab 06/05/22 1010 06/05/22 1017 06/06/22 0737 06/06/22 0945 06/07/22 0804  NA 137 134*  134* 133* 132* 137  K 3.9 3.9  3.9 3.6 3.6 3.3*  CL 92* 93* 92* 93* 95*  CO2 27  --  26 26 28   GLUCOSE 192* 192* 130* 142* 139*  BUN 47* 47* 61* 59* 35*  CREATININE 8.97* 10.10* 10.80* 10.49* 7.09*  ALBUMIN 3.2*  --  3.1* 2.8* 2.7*  CALCIUM 8.8*  --  8.4* 8.1* 8.3*  PHOS  --   --  4.5 4.4 4.4  AST 22  --   --   --   --   ALT 27  --   --   --   --    Liver Function Tests: Recent Labs  Lab 06/05/22 1010 06/06/22 0737 06/06/22 0945 06/07/22 0804  AST 22  --   --   --   ALT 27  --   --   --   ALKPHOS 67  --   --   --   BILITOT 1.6*  --   --   --   PROT 6.5  --   --   --   ALBUMIN 3.2* 3.1* 2.8* 2.7*   No results for input(s): "LIPASE", "AMYLASE" in the last 168 hours. No results for input(s): "AMMONIA" in the last 168 hours. CBC: Recent Labs  Lab 06/05/22 1010 06/05/22 1017 06/06/22 0737 06/06/22 0946 06/06/22 2009 06/07/22 0804  WBC 8.1  --  9.0 8.8  --  6.9  NEUTROABS 6.7  --   --   --   --   --   HGB 7.7*   < > 7.7* 7.0* 9.0* 9.3*  HCT 22.9*   < > 23.1* 21.0* 25.3* 26.6*  MCV 88.8  --  89.5 90.9  --  87.8  PLT 181  --  206 182  --  219   < > = values in this interval not displayed.    Cardiac Enzymes: No results for input(s): "CKTOTAL", "CKMB", "CKMBINDEX", "TROPONINI" in the last 168 hours. CBG: Recent Labs  Lab 06/06/22 0746 06/06/22 1625 06/06/22 2151 06/06/22 2248 06/07/22 0028  GLUCAP 130* 247* 138* 133* 121*    Iron Studies:  Recent Labs    06/06/22 0737  IRON 34*  TIBC 203*  FERRITIN 2,652*   Studies/Results: DG Chest Port 1 View  Result Date: 06/05/2022 CLINICAL DATA:  sob EXAM: PORTABLE CHEST 1 VIEW COMPARISON:  CXR 01/06/19 FINDINGS: Left axillary vascular stent in place. No pleural effusion. No pneumothorax. Cardiomegaly. Prominent bilateral interstitial opacities, favored to represent pulmonary venous congestion or mild pulmonary edema. No radiographically apparent displaced rib fractures. Visualized upper abdomen is  unremarkable. IMPRESSION: Cardiomegaly with prominent bilateral interstitial opacities, favored to represent pulmonary venous congestion or mild pulmonary edema. Electronically Signed   By: Lorenza Cambridge M.D.   On: 06/05/2022 10:48    amLODipine  10 mg Oral Daily   aspirin EC  81 mg Oral Daily   Chlorhexidine Gluconate Cloth  6 each Topical Q0600   ferric citrate  420 mg Oral TID WC   heparin  5,000 Units Subcutaneous Q8H   hydrALAZINE  50 mg Oral TID   insulin aspart  0-5 Units Subcutaneous QHS   insulin aspart  0-6 Units Subcutaneous TID WC   insulin aspart protamine- aspart  20 Units Subcutaneous QHS   ipratropium-albuterol  3 mL Nebulization TID   isosorbide mononitrate  30 mg Oral Once per day on Sun Mon Wed Fri   metoprolol succinate  50 mg Oral Daily   rosuvastatin  40 mg Oral QHS    BMET    Component Value Date/Time   NA 137 06/07/2022 0804   K 3.3 (L) 06/07/2022 0804   CL 95 (L) 06/07/2022 0804   CO2 28 06/07/2022 0804   GLUCOSE 139 (H) 06/07/2022 0804   BUN 35 (H) 06/07/2022 0804   CREATININE 7.09 (H) 06/07/2022 0804   CALCIUM 8.3 (L) 06/07/2022 0804   CALCIUM 7.9 (L) 04/29/2018 1350   GFRNONAA 8 (L)  06/07/2022 0804   GFRAA 6 (L) 01/13/2019 0324   CBC    Component Value Date/Time   WBC 6.9 06/07/2022 0804   RBC 3.03 (L) 06/07/2022 0804   HGB 9.3 (L) 06/07/2022 0804   HCT 26.6 (L) 06/07/2022 0804   PLT 219 06/07/2022 0804   MCV 87.8 06/07/2022 0804   MCH 30.7 06/07/2022 0804   MCHC 35.0 06/07/2022 0804   RDW 14.1 06/07/2022 0804   LYMPHSABS 0.5 (L) 06/05/2022 1010   MONOABS 0.8 06/05/2022 1010   EOSABS 0.0 06/05/2022 1010   BASOSABS 0.0 06/05/2022 1010    Dialysis Orders: Center: [facility]  on TTS . EDW 77.9kg HD Bath 2K/2Ca  Time 4:00 Heparin none. Access LUE AVF BFR 450 DFR A1.5    Micera 75 mcg IVP every 2 weeks Hectoral 2 mcg IVP TIW   Assessment/Plan:  Acute hypoxic respiratory failure - possible volume overload, although he has been compliant with HD.  Viral URI also on DDx.  Currently stable and in no respiratory distress.  Able to UF 3.5 L yesterday and now down to 1 L O2 via [place].  Will plan for short session of sequential HD today and his normal session of HD tomorrow to help challenge his edw.  He is currently 2 kg above edw.  Other interventions, per primary svc.  ESRD -  as above, continue with HD on TTS schedule.  Hypertension/volume  -  Will UF and challenge his EDW as able.  Anemia  - His Hgb dropped from 9 on 06/01/22 to 7.5.  need to r/o occult GI bleed  Metabolic bone disease -  continue with home meds and low phos diet  Nutrition -  renal diet, carb modified  Unintentional weight loss - he cannot quantify, but admits to poor po intake and weight loss.  Denies any N/V/D, dysgeusia.    Irena Cords, MD [facility]

## 2022-06-07 NOTE — Progress Notes (Signed)
Received patient in bed to unit.  Alert and oriented.  Informed consent signed and in chart.   TX duration: 2.51 Circuit clotted with left on the tx; patient refused to restart.   Patient tolerated well.  Alert, without acute distress.  Hand-off given to patient's nurse.   Access used: L AVF Access issues: None  Total UF removed: 2400 Medication(s) given: None  Post HD weight: 71.6kg   06/07/22 1704  Vitals  Temp 98 F (36.7 C)  Temp Source Oral  BP (!) 109/58  MAP (mmHg) 75  BP Location Right Arm  BP Method Automatic  Patient Position (if appropriate) Lying  Pulse Rate 60  Pulse Rate Source Monitor  ECG Heart Rate (!) 59  Resp (!) 25  Oxygen Therapy  SpO2 98 %  O2 Device Nasal Cannula  O2 Flow Rate (L/min) 1 L/min  Pulse Oximetry Type Continuous  During Treatment Monitoring  Intra-Hemodialysis Comments Tx completed (Machine clotted with left of HD tx; patient refused to restart. Rinsebacked in progress. VSS; A&Ox4.)  Post Treatment  Dialyzer Clearance Heavily streaked  Duration of HD Treatment -hour(s) 2.5 hour(s)  Liters Processed 60.5  Fluid Removed (mL) 2400 mL  Tolerated HD Treatment Yes  AVG/AVF Arterial Site Held (minutes) 6 minutes  AVG/AVF Venous Site Held (minutes) 6 minutes     [facility]

## 2022-06-08 ENCOUNTER — Inpatient Hospital Stay (HOSPITAL_COMMUNITY): Payer: Medicare HMO

## 2022-06-08 DIAGNOSIS — I5020 Unspecified systolic (congestive) heart failure: Secondary | ICD-10-CM | POA: Diagnosis not present

## 2022-06-08 DIAGNOSIS — D649 Anemia, unspecified: Secondary | ICD-10-CM | POA: Diagnosis not present

## 2022-06-08 DIAGNOSIS — R0602 Shortness of breath: Secondary | ICD-10-CM | POA: Diagnosis not present

## 2022-06-08 DIAGNOSIS — J9601 Acute respiratory failure with hypoxia: Secondary | ICD-10-CM | POA: Diagnosis not present

## 2022-06-08 DIAGNOSIS — I132 Hypertensive heart and chronic kidney disease with heart failure and with stage 5 chronic kidney disease, or end stage renal disease: Secondary | ICD-10-CM | POA: Diagnosis not present

## 2022-06-08 LAB — GLUCOSE, CAPILLARY
Glucose-Capillary: 126 mg/dL — ABNORMAL HIGH (ref 70–99)
Glucose-Capillary: 130 mg/dL — ABNORMAL HIGH (ref 70–99)
Glucose-Capillary: 214 mg/dL — ABNORMAL HIGH (ref 70–99)
Glucose-Capillary: 148 mg/dL — ABNORMAL HIGH (ref 70–99)

## 2022-06-08 LAB — ECHOCARDIOGRAM LIMITED
AV Mean grad: 19 mmHg
AV Peak grad: 35.3 mmHg
Ao pk vel: 2.97 m/s
Area-P 1/2: 2.91 cm2
Calc EF: 59.3 %
Height: 69 in
S' Lateral: 3.5 cm
Single Plane A2C EF: 58.7 %
Single Plane A4C EF: 58.4 %
Weight: 2525.59 oz

## 2022-06-08 LAB — CBC
HCT: 27.5 % — ABNORMAL LOW (ref 39.0–52.0)
Hemoglobin: 9.6 g/dL — ABNORMAL LOW (ref 13.0–17.0)
MCH: 31 pg (ref 26.0–34.0)
MCHC: 34.9 g/dL (ref 30.0–36.0)
MCV: 88.7 fL (ref 80.0–100.0)
Platelets: 259 10*3/uL (ref 150–400)
RBC: 3.1 MIL/uL — ABNORMAL LOW (ref 4.22–5.81)
RDW: 14.1 % (ref 11.5–15.5)
WBC: 6.6 10*3/uL (ref 4.0–10.5)
nRBC: 0 % (ref 0.0–0.2)

## 2022-06-08 LAB — RENAL FUNCTION PANEL
Albumin: 2.7 g/dL — ABNORMAL LOW (ref 3.5–5.0)
Anion gap: 15 (ref 5–15)
BUN: 53 mg/dL — ABNORMAL HIGH (ref 8–23)
CO2: 26 mmol/L (ref 22–32)
Calcium: 8.3 mg/dL — ABNORMAL LOW (ref 8.9–10.3)
Chloride: 96 mmol/L — ABNORMAL LOW (ref 98–111)
Creatinine, Ser: 8.58 mg/dL — ABNORMAL HIGH (ref 0.61–1.24)
GFR, Estimated: 6 mL/min — ABNORMAL LOW (ref >60)
Glucose, Bld: 189 mg/dL — ABNORMAL HIGH (ref 70–99)
Phosphorus: 5.2 mg/dL — ABNORMAL HIGH (ref 2.5–4.6)
Potassium: 3.3 mmol/L — ABNORMAL LOW (ref 3.5–5.1)
Sodium: 137 mmol/L (ref 135–145)

## 2022-06-08 MED ORDER — IPRATROPIUM-ALBUTEROL 0.5-2.5 (3) MG/3ML IN SOLN: 3.0000 mL | Freq: Four times a day (QID) | RESPIRATORY_TRACT | Status: DC | PRN

## 2022-06-08 NOTE — Progress Notes (Signed)
  2D Echocardiogram has been performed.  Justin [name] 06/08/2022, 10:11 AM

## 2022-06-08 NOTE — Discharge Summary (Signed)
Name: Justin [name] MRN: [id number] DOB: 01-21-1950 72 y.o. PCP: Adrian Prince, MD  Date of Admission: 06/05/2022  9:47 AM Date of Discharge: 06/08/2022 Attending Physician: Dr. Heide Spark  Discharge Diagnosis: Principal Problem:   Acute hypoxic respiratory failure (HCC) Active Problems:   End stage renal disease (HCC)   Type 2 diabetes mellitus with chronic kidney disease on chronic dialysis, with long-term current use of insulin (HCC)   Hyperlipidemia   Primary hypertension   Anemia in chronic kidney disease    Discharge Medications: Allergies as of 06/08/2022   No Known Allergies      Medication List     TAKE these medications    amLODipine 10 MG tablet Commonly known as: NORVASC Take 1 tablet (10 mg total) by mouth daily.   aspirin EC 81 MG tablet Take 81 mg by mouth daily. Swallow whole.   cyanocobalamin 500 MCG tablet Commonly known as: VITAMIN B12 Take 500 mcg by mouth daily. Patient gf unsure of dose.   ferric citrate 1 GM 210 MG(Fe) tablet Commonly known as: AURYXIA Take 420 mg by mouth 3 (three) times daily with meals.   hydrALAZINE 25 MG tablet Commonly known as: APRESOLINE Take 25 mg by mouth 3 (three) times daily.   isosorbide mononitrate 30 MG 24 hr tablet Commonly known as: IMDUR TAKE 1 TABLET BY MOUTH IN THE MORNING ON  NON  DIALYSIS  DAYS What changed: See the new instructions.   metoprolol succinate 50 MG 24 hr tablet Commonly known as: TOPROL-XL Take 50 mg by mouth daily. Take 2 tablets 2xs a day mon,wed,fri,sun   nitroGLYCERIN 0.4 MG SL tablet Commonly known as: NITROSTAT Place 0.4 mg under the tongue every 5 (five) minutes as needed for chest pain.   NovoLIN 70/30 ReliOn (70-30) 100 UNIT/ML injection Generic drug: insulin NPH-regular Human Inject 20 Units into the skin daily. What changed:  when to take this additional instructions   rosuvastatin 40 MG tablet Commonly known as: CRESTOR Take 40 mg by mouth daily.         Disposition and follow-up:   Mr.Naaman W Szczesniak was discharged from [facility] in Stable condition.  At the hospital follow up visit please address:  1.  Follow-up:  a.  Acute hypoxic respiratory failure: This was likely secondary to volume overload during hospitalization.  Patient likely has new dry weight.  Continue to challenge dry weight outpatient.  Ensure patient continues with hemodialysis.  Check weight at next office visit.  Check pulse ox during next office visit.    b.  Acute on chronic anemia: Patient had significant bleed prior to hospitalization from AV fistula.  This likely causes acute on chronic anemia.  Hemoglobin back to baseline during hospitalization.  Follow-up CBC outpatient.  Ensure patient does not have any other bleeding episodes.  If hemoglobin continues to drop, refer patient to outpatient gastroenterology.   c.  Moderate pericardial effusion: Patient remained asymptomatic.  This was found on repeat echo during hospitalization.  Continue to follow-up outpatient.  2.  Labs / imaging needed at time of follow-up: CBC  3.  Pending labs/ test needing follow-up: N/A  4.  Medication Changes: N/A  Follow-up Appointments:  Follow-up Information     Adrian Prince, MD. Call in 1 day(s).   Specialty: Endocrinology Why: Please call to schedule appointment with your primary care physician for hospital follow up in about 1 week Contact information: [address] [place] [address] [phone]  Hospital Course by problem list: JAKALEB COMPERE is a 72 y.o. person living with a history of hypertension, ESRD on dialysis Tuesday, Thursday, Saturday, HFrecEF, type 2 diabetes who presents with concerns of shortness of breath.  Patient found to have acute hypoxemic respiratory failure requiring supplemental oxygen and admitted for further evaluation and management.    #Acute hypoxemic respiratory failure Patient presented to the  hospital with concerns of a 6 day history of shortness of breath. He initially stated that this stated as a cough. He had sats in the mid 80s when he presented. Xray showed evidence of pulmonary edema.  Patient was admitted for acute hypoxic respiratory failure in setting of volume overload.  He did state he has been losing weight, and likely has a new dry weight.  Suspicion was current dialysis treatment was not getting him to his true dry weight.  Patient was dialyzed during hospitalization, and oxygenation improved.  Patient was ambulated in hospital and did well.  He was discharged without any supplemental oxygen.    #Acute on chronic anemia Patient baseline hemoglobin around 9.3-10.  Patient has chronic anemia of CKD.  6 days prior to admission, patient had major bleeding episode from AV fistula.  Initial hemoglobin was 7.7, which dropped to 7.0.  Patient had 1 unit packed red blood cells transfusion during hospitalization.  Hemoglobin turned out well.  Iron studies were obtained which did not show evidence of iron deficiency anemia.    #HFrecEF #Pericardial effusion Patient did not have heart failure exacerbation during hospitalization.  Patient had repeat echocardiogram which showed ejection fraction of 50 to 55%.  Patient did have moderate pericardial effusion.  Patient is asymptomatic.  Continue to follow.  Patient was continued on metoprolol succinate 50 mg daily, hydralazine 50 mg 3 times daily, Imdur 30 mg on nondialysis days.    #ESRD on HD Patient's estimated dry weight was 77.9 kg.  Patient was dialyzed during hospitalization.  Suspected volume overload in the setting of a new dry weight given patient has been losing weight.  Patient is continue to follow by nephrology inpatient.  Patient continue taking ferric citrate 420 mg twice daily.    #Type 2 diabetes mellitus Patient has history of type 2 diabetes mellitus.  Patient was continued on home dose of 20 units Novolin nightly.  Blood  sugars measured well during hospitalization.  A1c was 6.4.   #Hypertension Blood pressure remained stable during hospitalization.  Patient was continued on metoprolol succinate 50 mg daily, hydralazine 50 mg 3 times daily, Imdur 30 mg on nondialysis days.    #Hyperlipidemia Patient has a past medical history of hyperlipidemia. Patient was continued on Crestor 40 mg daily.   Discharge Subjective:  Patient states he is doing well this morning.  He denies any chest pain or shortness of breath.  He reports that he has been up and moving with no concerns.  He states he is ready go home.  Discharge Exam:   BP (!) 155/55 (BP Location: Right Arm)   Pulse 60   Temp 97.6 F (36.4 C) (Oral)   Resp 20   Ht 5\' 9"  (1.753 m)   Wt 71.2 kg   SpO2 93%   BMI 23.18 kg/m  Constitutional: Well-appearing, resting bed, no acute distress. HENT: normocephalic atraumatic, mucous membranes moist Cardiovascular: Regular rate and rhythm.  Systolic murmur noted. Pulmonary/Chest: Bibasilar crackles appreciated, no wheezing no increased work of breathing Abdominal: soft, non-tender, non-distended  Pertinent Labs, Studies, and Procedures:  Latest Ref Rng & Units 06/08/2022    7:48 AM 06/07/2022    8:04 AM 06/06/2022    8:09 PM  CBC  WBC 4.0 - 10.5 K/uL 6.6  6.9    Hemoglobin 13.0 - 17.0 g/dL 9.6  9.3  9.0   Hematocrit 39.0 - 52.0 % 27.5  26.6  25.3   Platelets 150 - 400 K/uL 259  219         Latest Ref Rng & Units 06/08/2022    7:48 AM 06/07/2022    8:04 AM 06/06/2022    9:45 AM  CMP  Glucose 70 - 99 mg/dL 161  096  045   BUN 8 - 23 mg/dL 53  35  59   Creatinine 0.61 - 1.24 mg/dL 4.09  8.11  91.47   Sodium 135 - 145 mmol/L 137  137  132   Potassium 3.5 - 5.1 mmol/L 3.3  3.3  3.6   Chloride 98 - 111 mmol/L 96  95  93   CO2 22 - 32 mmol/L 26  28  26    Calcium 8.9 - 10.3 mg/dL 8.3  8.3  8.1     DG Chest Port 1 View  Result Date: 06/05/2022 CLINICAL DATA:  sob EXAM: PORTABLE CHEST 1 VIEW COMPARISON:   CXR 01/06/19 FINDINGS: Left axillary vascular stent in place. No pleural effusion. No pneumothorax. Cardiomegaly. Prominent bilateral interstitial opacities, favored to represent pulmonary venous congestion or mild pulmonary edema. No radiographically apparent displaced rib fractures. Visualized upper abdomen is unremarkable. IMPRESSION: Cardiomegaly with prominent bilateral interstitial opacities, favored to represent pulmonary venous congestion or mild pulmonary edema. Electronically Signed   By: Lorenza Cambridge M.D.   On: 06/05/2022 10:48     Discharge Instructions: Discharge Instructions     Ambulatory referral to Gastroenterology   Complete by: As directed    What is the reason for referral?: Other Comment - Anemia   Call MD for:  difficulty breathing, headache or visual disturbances   Complete by: As directed    Call MD for:  extreme fatigue   Complete by: As directed    Call MD for:  persistant nausea and vomiting   Complete by: As directed    Call MD for:  redness, tenderness, or signs of infection (pain, swelling, redness, odor or green/yellow discharge around incision site)   Complete by: As directed    Call MD for:  severe uncontrolled pain   Complete by: As directed    Call MD for:  temperature >100.4   Complete by: As directed    Diet - low sodium heart healthy   Complete by: As directed    Increase activity slowly   Complete by: As directed       Mr. Orren Timblin, It was a pleasure taking care of you at [facility]. You were admitted for hypoxemic respiratory failure.  You are treated with hemodialysis and improved well. We are discharging you home now that you are doing better. Please follow the following instructions.   1) Given your end-stage renal disease, please continue with your dialysis appointments.  Please continue following up with your kidney doctor.  2) Within the next week please follow-up with your primary care doctor.  3) Regarding your ultrasound,  your heart has some fluid around it, please make sure you are primary care doctor follows this up.  If you develop any further shortness of breath, decreased blood pressure, please return back to the emergency department.  4) Please  continue taking all of your medications as prescribed, I have not changed any medications for you during your hospitalization.  Take care,  Dr. Modena Slater, DO    Signed: Modena Slater, DO 06/08/2022, 2:24 PM   Pager: [phone]

## 2022-06-08 NOTE — Progress Notes (Signed)
D/C order noted. Contacted [facility] to advise clinic of pt's d/c today and that pt should resume care on Saturday.   Olivia Canter Renal Navigator [phone]

## 2022-06-08 NOTE — Discharge Instructions (Signed)
Justin [name], It was a pleasure taking care of you at [facility]. You were admitted for hypoxemic respiratory failure.  You are treated with hemodialysis and improved well. We are discharging you home now that you are doing better. Please follow the following instructions.   1) Given your end-stage renal disease, please continue with your dialysis appointments.  Please continue following up with your kidney doctor.  2) Within the next week please follow-up with your primary care doctor.  3) Regarding your ultrasound, your heart has some fluid around it, please make sure you are primary care doctor follows this up.  If you develop any further shortness of breath, decreased blood pressure, please return back to the emergency department.  4) Please continue taking all of your medications as prescribed, I have not changed any medications for you during your hospitalization.  Take care,  Dr. Modena Slater, DO

## 2022-06-08 NOTE — Progress Notes (Signed)
Received patient in bed to unit.  Alert and oriented.  Informed consent signed and in chart.   TX duration: 3hours  Patient tolerated well.  Alert, without acute distress.  Hand-off given to patient's nurse.   Access used: L AVF Access issues: None  Total UF removed: Medication(s) given: None  Post HD weight: 68.7kg   06/08/22 1745  Vitals  Temp 97.8 F (36.6 C)  Temp Source Oral  BP (!) 114/57  MAP (mmHg) 74  BP Location Right Arm  BP Method Automatic  Patient Position (if appropriate) Lying  Pulse Rate (!) 52  Pulse Rate Source Monitor  ECG Heart Rate (!) 57  Resp 20  Oxygen Therapy  SpO2 93 %  O2 Device Room Air  During Treatment Monitoring  Intra-Hemodialysis Comments Tx completed  Post Treatment  Dialyzer Clearance Lightly streaked  Duration of HD Treatment -hour(s) 3 hour(s)  Liters Processed 72  Fluid Removed (mL) 2500 mL  Tolerated HD Treatment Yes  Post-Hemodialysis Comments 7  AVG/AVF Arterial Site Held (minutes) 7 minutes  Fistula / Graft Left Upper arm Arteriovenous fistula  Placement Date/Time: 08/28/18 0814   Orientation: Left  Access Location: (c) Upper arm  Access Type: Arteriovenous fistula  Site Condition No complications  Fistula / Graft Assessment Present;Thrill;Bruit  Status Deaccessed     [facility]

## 2022-06-08 NOTE — Progress Notes (Signed)
Patient ID: Justin [name], male   DOB: 1950/11/07, 72 y.o.   MRN: [id number] S: Feels much better and off of oxygen O:BP (!) 147/54 (BP Location: Right Arm)   Pulse 66   Temp 98.1 F (36.7 C) (Oral)   Resp 16   Ht 5\' 9"  (1.753 m)   Wt 71.6 kg   SpO2 99%   BMI 23.31 kg/m   Intake/Output Summary (Last 24 hours) at 06/08/2022 1015 Last data filed at 06/08/2022 0829 Gross per 24 hour  Intake 480 ml  Output 2400 ml  Net -1920 ml   Intake/Output: I/O last 3 completed shifts: In: 360 [P.O.:360] Out: 2400 [Other:2400]  Intake/Output this shift:  Total I/O In: 240 [P.O.:240] Out: -  Weight change: -5.9 kg Gen: NAD CVS: RRR  Resp:CTA  Abd:_+BS, soft,NT/ND Ext: no edema, LUE AVF +T/B  Recent Labs  Lab 06/05/22 1010 06/05/22 1017 06/06/22 0737 06/06/22 0945 06/07/22 0804 06/08/22 0748  NA 137 134*  134* 133* 132* 137 137  K 3.9 3.9  3.9 3.6 3.6 3.3* 3.3*  CL 92* 93* 92* 93* 95* 96*  CO2 27  --  26 26 28 26   GLUCOSE 192* 192* 130* 142* 139* 189*  BUN 47* 47* 61* 59* 35* 53*  CREATININE 8.97* 10.10* 10.80* 10.49* 7.09* 8.58*  ALBUMIN 3.2*  --  3.1* 2.8* 2.7* 2.7*  CALCIUM 8.8*  --  8.4* 8.1* 8.3* 8.3*  PHOS  --   --  4.5 4.4 4.4 5.2*  AST 22  --   --   --   --   --   ALT 27  --   --   --   --   --    Liver Function Tests: Recent Labs  Lab 06/05/22 1010 06/06/22 0737 06/06/22 0945 06/07/22 0804 06/08/22 0748  AST 22  --   --   --   --   ALT 27  --   --   --   --   ALKPHOS 67  --   --   --   --   BILITOT 1.6*  --   --   --   --   PROT 6.5  --   --   --   --   ALBUMIN 3.2*   < > 2.8* 2.7* 2.7*   < > = values in this interval not displayed.   No results for input(s): "LIPASE", "AMYLASE" in the last 168 hours. No results for input(s): "AMMONIA" in the last 168 hours. CBC: Recent Labs  Lab 06/05/22 1010 06/05/22 1017 06/06/22 0737 06/06/22 0946 06/06/22 2009 06/07/22 0804 06/08/22 0748  WBC 8.1  --  9.0 8.8  --  6.9 6.6  NEUTROABS 6.7  --   --   --   --    --   --   HGB 7.7*   < > 7.7* 7.0* 9.0* 9.3* 9.6*  HCT 22.9*   < > 23.1* 21.0* 25.3* 26.6* 27.5*  MCV 88.8  --  89.5 90.9  --  87.8 88.7  PLT 181  --  206 182  --  219 259   < > = values in this interval not displayed.   Cardiac Enzymes: No results for input(s): "CKTOTAL", "CKMB", "CKMBINDEX", "TROPONINI" in the last 168 hours. CBG: Recent Labs  Lab 06/07/22 1811 06/07/22 2147 06/08/22 0311 06/08/22 0538 06/08/22 0826  GLUCAP 141* 279* 126* 130* 214*    Iron Studies:  Recent Labs    06/06/22 0737  IRON  34*  TIBC 203*  FERRITIN 2,652*   Studies/Results: No results found.  amLODipine  10 mg Oral Daily   aspirin EC  81 mg Oral Daily   Chlorhexidine Gluconate Cloth  6 each Topical Q0600   ferric citrate  420 mg Oral TID WC   heparin  5,000 Units Subcutaneous Q8H   hydrALAZINE  50 mg Oral TID   insulin aspart  0-5 Units Subcutaneous QHS   insulin aspart  0-6 Units Subcutaneous TID WC   insulin aspart protamine- aspart  20 Units Subcutaneous QHS   isosorbide mononitrate  30 mg Oral Once per day on Sun Mon Wed Fri   metoprolol succinate  50 mg Oral Daily   rosuvastatin  40 mg Oral QHS    BMET    Component Value Date/Time   NA 137 06/08/2022 0748   K 3.3 (L) 06/08/2022 0748   CL 96 (L) 06/08/2022 0748   CO2 26 06/08/2022 0748   GLUCOSE 189 (H) 06/08/2022 0748   BUN 53 (H) 06/08/2022 0748   CREATININE 8.58 (H) 06/08/2022 0748   CALCIUM 8.3 (L) 06/08/2022 0748   CALCIUM 7.9 (L) 04/29/2018 1350   GFRNONAA 6 (L) 06/08/2022 0748   GFRAA 6 (L) 01/13/2019 0324   CBC    Component Value Date/Time   WBC 6.6 06/08/2022 0748   RBC 3.10 (L) 06/08/2022 0748   HGB 9.6 (L) 06/08/2022 0748   HCT 27.5 (L) 06/08/2022 0748   PLT 259 06/08/2022 0748   MCV 88.7 06/08/2022 0748   MCH 31.0 06/08/2022 0748   MCHC 34.9 06/08/2022 0748   RDW 14.1 06/08/2022 0748   LYMPHSABS 0.5 (L) 06/05/2022 1010   MONOABS 0.8 06/05/2022 1010   EOSABS 0.0 06/05/2022 1010   BASOSABS 0.0  06/05/2022 1010    Dialysis Orders: Center: [facility]  on TTS . EDW 77.9kg HD Bath 2K/2Ca  Time 4:00 Heparin none. Access LUE AVF BFR 450 DFR A1.5    Micera 75 mcg IVP every 2 weeks Hectoral 2 mcg IVP TIW   Assessment/Plan:  Acute hypoxic respiratory failure - possible volume overload, although he has been compliant with HD.  Viral URI also on DDx.  Currently stable and in no respiratory distress.  Able to UF almost 6 L with serial HD and is off of supplemental oxygen.  Plan for his regular session today and continue to challenge his EDW.  ECHO performed today.  Other interventions, per primary svc.  ESRD -  as above, continue with HD on TTS schedule.  Hypertension/volume  -  Will UF and challenge his EDW as able.  Weight has been variable over the past 24 hours will need to check standing weight in HD today.   Anemia  - His Hgb dropped from 9 on 06/01/22 to 7.5.  need to r/o occult GI bleed  Metabolic bone disease -  continue with home meds and low phos diet  Nutrition -  renal diet, carb modified  Unintentional weight loss - he cannot quantify, but admits to poor po intake and weight loss.  Denies any N/V/D, dysgeusia.  Irena Cords, MD [facility]

## 2022-06-09 LAB — CULTURE, BLOOD (ROUTINE X 2): Culture: NO GROWTH

## 2022-06-09 NOTE — Plan of Care (Addendum)
[facility] Patient Discharge Orders- [facility] CLINIC: [facility]  Patient's name: Justin [name] Admit/DC Dates: 06/05/2022 - 06/08/2022  Discharge Diagnoses: Acute hypoxic respiratory failure - 2nd to volume overload. Challenged here on HD.  Acute on chronic anemia - noted bleeding from AVF, see below for further instructions. Hgb now back to baseline. Follow CBC in outpatient and let renal team know if he reports any signs of bleeding or down-trending of Hgb.  Aranesp: Given: No     Last Hgb: 9.6 PRBC's Given: Yes Date/# of units: 1 unit PRBCs on 06/06/22 ESA dose for discharge: Resume mircera 75 mcg IV q 2 weeks   Heparin change: N/A  EDW Change: Yes New EDW: Lower to 70.5kg. Apparently, he's been having poor PO intake causing weight loss.  Bath Change: Yes. Last K+ here was 3.3. Change to 3K bath and check K+ levels weekly X 3. Continue 2Ca bath.  Access intervention/Change: No. Noted patient had bleeding from his AVF while here. Check a AF at his next HD (06/10/22). If less than 600 or has ongoing bleeding, send to [facility] for a F'gram. No heparin for now.  Hectorol change: No  Discharge Labs: Calcium 8.3 Phosphorus 5.2 Albumin 2.7 K+ 3.3  IV Antibiotics: No  On Coumadin?: No     D/C Meds to be reconciled by nurse after every discharge.  Completed By: Salome Holmes, NP-C [facility]   Reviewed by: MD:______ RN_______

## 2022-06-09 NOTE — TOC Transition Note (Signed)
Transition of Care - Initial Contact from Inpatient Facility  Date of discharge: 06/08/22 Date of contact: 06/09/22  Method: Phone Spoke to: Patient  Patient contacted to discuss transition of care from recent inpatient hospitalization. Patient was admitted to [facility] from 06/05/22-06/08/22 with discharge diagnosis of acute hypoxic resp failure and acute on chronic anemia.  The discharge medication list was reviewed. Patient understands the changes and has no concerns.   Patient will return to his outpatient HD unit on: 06/10/22 at [facility].  Salome Holmes, NP

## 2022-06-10 DIAGNOSIS — N186 End stage renal disease: Secondary | ICD-10-CM | POA: Diagnosis not present

## 2022-06-10 DIAGNOSIS — N2581 Secondary hyperparathyroidism of renal origin: Secondary | ICD-10-CM | POA: Diagnosis not present

## 2022-06-10 DIAGNOSIS — Z992 Dependence on renal dialysis: Secondary | ICD-10-CM | POA: Diagnosis not present

## 2022-06-10 LAB — CULTURE, BLOOD (ROUTINE X 2)

## 2022-06-13 DIAGNOSIS — Z992 Dependence on renal dialysis: Secondary | ICD-10-CM | POA: Diagnosis not present

## 2022-06-13 DIAGNOSIS — N2581 Secondary hyperparathyroidism of renal origin: Secondary | ICD-10-CM | POA: Diagnosis not present

## 2022-06-13 DIAGNOSIS — N186 End stage renal disease: Secondary | ICD-10-CM | POA: Diagnosis not present

## 2022-06-15 DIAGNOSIS — N2581 Secondary hyperparathyroidism of renal origin: Secondary | ICD-10-CM | POA: Diagnosis not present

## 2022-06-15 DIAGNOSIS — Z992 Dependence on renal dialysis: Secondary | ICD-10-CM | POA: Diagnosis not present

## 2022-06-15 DIAGNOSIS — N186 End stage renal disease: Secondary | ICD-10-CM | POA: Diagnosis not present

## 2022-06-17 DIAGNOSIS — N2581 Secondary hyperparathyroidism of renal origin: Secondary | ICD-10-CM | POA: Diagnosis not present

## 2022-06-17 DIAGNOSIS — Z992 Dependence on renal dialysis: Secondary | ICD-10-CM | POA: Diagnosis not present

## 2022-06-17 DIAGNOSIS — N186 End stage renal disease: Secondary | ICD-10-CM | POA: Diagnosis not present

## 2022-06-20 DIAGNOSIS — N2581 Secondary hyperparathyroidism of renal origin: Secondary | ICD-10-CM | POA: Diagnosis not present

## 2022-06-20 DIAGNOSIS — Z992 Dependence on renal dialysis: Secondary | ICD-10-CM | POA: Diagnosis not present

## 2022-06-20 DIAGNOSIS — N186 End stage renal disease: Secondary | ICD-10-CM | POA: Diagnosis not present

## 2022-06-21 DIAGNOSIS — I350 Nonrheumatic aortic (valve) stenosis: Secondary | ICD-10-CM | POA: Diagnosis not present

## 2022-06-21 DIAGNOSIS — D631 Anemia in chronic kidney disease: Secondary | ICD-10-CM | POA: Diagnosis not present

## 2022-06-21 DIAGNOSIS — E1129 Type 2 diabetes mellitus with other diabetic kidney complication: Secondary | ICD-10-CM | POA: Diagnosis not present

## 2022-06-21 DIAGNOSIS — J9601 Acute respiratory failure with hypoxia: Secondary | ICD-10-CM | POA: Diagnosis not present

## 2022-06-21 DIAGNOSIS — N186 End stage renal disease: Secondary | ICD-10-CM | POA: Diagnosis not present

## 2022-06-21 DIAGNOSIS — D62 Acute posthemorrhagic anemia: Secondary | ICD-10-CM | POA: Diagnosis not present

## 2022-06-21 DIAGNOSIS — I3139 Other pericardial effusion (noninflammatory): Secondary | ICD-10-CM | POA: Diagnosis not present

## 2022-06-22 DIAGNOSIS — N2581 Secondary hyperparathyroidism of renal origin: Secondary | ICD-10-CM | POA: Diagnosis not present

## 2022-06-22 DIAGNOSIS — Z992 Dependence on renal dialysis: Secondary | ICD-10-CM | POA: Diagnosis not present

## 2022-06-22 DIAGNOSIS — N186 End stage renal disease: Secondary | ICD-10-CM | POA: Diagnosis not present

## 2022-06-24 DIAGNOSIS — N186 End stage renal disease: Secondary | ICD-10-CM | POA: Diagnosis not present

## 2022-06-24 DIAGNOSIS — Z992 Dependence on renal dialysis: Secondary | ICD-10-CM | POA: Diagnosis not present

## 2022-06-24 DIAGNOSIS — N2581 Secondary hyperparathyroidism of renal origin: Secondary | ICD-10-CM | POA: Diagnosis not present

## 2022-06-27 DIAGNOSIS — N2581 Secondary hyperparathyroidism of renal origin: Secondary | ICD-10-CM | POA: Diagnosis not present

## 2022-06-27 DIAGNOSIS — Z992 Dependence on renal dialysis: Secondary | ICD-10-CM | POA: Diagnosis not present

## 2022-06-27 DIAGNOSIS — N186 End stage renal disease: Secondary | ICD-10-CM | POA: Diagnosis not present

## 2022-06-29 DIAGNOSIS — Z992 Dependence on renal dialysis: Secondary | ICD-10-CM | POA: Diagnosis not present

## 2022-06-29 DIAGNOSIS — N2581 Secondary hyperparathyroidism of renal origin: Secondary | ICD-10-CM | POA: Diagnosis not present

## 2022-06-29 DIAGNOSIS — N186 End stage renal disease: Secondary | ICD-10-CM | POA: Diagnosis not present

## 2022-07-01 DIAGNOSIS — N186 End stage renal disease: Secondary | ICD-10-CM | POA: Diagnosis not present

## 2022-07-01 DIAGNOSIS — Z992 Dependence on renal dialysis: Secondary | ICD-10-CM | POA: Diagnosis not present

## 2022-07-01 DIAGNOSIS — N2581 Secondary hyperparathyroidism of renal origin: Secondary | ICD-10-CM | POA: Diagnosis not present

## 2022-07-02 DIAGNOSIS — Z992 Dependence on renal dialysis: Secondary | ICD-10-CM | POA: Diagnosis not present

## 2022-07-02 DIAGNOSIS — E1129 Type 2 diabetes mellitus with other diabetic kidney complication: Secondary | ICD-10-CM | POA: Diagnosis not present

## 2022-07-02 DIAGNOSIS — N186 End stage renal disease: Secondary | ICD-10-CM | POA: Diagnosis not present

## 2022-07-04 DIAGNOSIS — N186 End stage renal disease: Secondary | ICD-10-CM | POA: Diagnosis not present

## 2022-07-04 DIAGNOSIS — Z992 Dependence on renal dialysis: Secondary | ICD-10-CM | POA: Diagnosis not present

## 2022-07-04 DIAGNOSIS — N2581 Secondary hyperparathyroidism of renal origin: Secondary | ICD-10-CM | POA: Diagnosis not present

## 2022-07-06 DIAGNOSIS — Z992 Dependence on renal dialysis: Secondary | ICD-10-CM | POA: Diagnosis not present

## 2022-07-06 DIAGNOSIS — N186 End stage renal disease: Secondary | ICD-10-CM | POA: Diagnosis not present

## 2022-07-06 DIAGNOSIS — N2581 Secondary hyperparathyroidism of renal origin: Secondary | ICD-10-CM | POA: Diagnosis not present

## 2022-07-08 DIAGNOSIS — N2581 Secondary hyperparathyroidism of renal origin: Secondary | ICD-10-CM | POA: Diagnosis not present

## 2022-07-08 DIAGNOSIS — N186 End stage renal disease: Secondary | ICD-10-CM | POA: Diagnosis not present

## 2022-07-08 DIAGNOSIS — Z992 Dependence on renal dialysis: Secondary | ICD-10-CM | POA: Diagnosis not present

## 2022-07-11 DIAGNOSIS — N186 End stage renal disease: Secondary | ICD-10-CM | POA: Diagnosis not present

## 2022-07-11 DIAGNOSIS — N2581 Secondary hyperparathyroidism of renal origin: Secondary | ICD-10-CM | POA: Diagnosis not present

## 2022-07-11 DIAGNOSIS — Z992 Dependence on renal dialysis: Secondary | ICD-10-CM | POA: Diagnosis not present

## 2022-07-13 DIAGNOSIS — Z992 Dependence on renal dialysis: Secondary | ICD-10-CM | POA: Diagnosis not present

## 2022-07-13 DIAGNOSIS — N186 End stage renal disease: Secondary | ICD-10-CM | POA: Diagnosis not present

## 2022-07-13 DIAGNOSIS — N2581 Secondary hyperparathyroidism of renal origin: Secondary | ICD-10-CM | POA: Diagnosis not present

## 2022-07-15 DIAGNOSIS — N2581 Secondary hyperparathyroidism of renal origin: Secondary | ICD-10-CM | POA: Diagnosis not present

## 2022-07-15 DIAGNOSIS — N186 End stage renal disease: Secondary | ICD-10-CM | POA: Diagnosis not present

## 2022-07-15 DIAGNOSIS — Z992 Dependence on renal dialysis: Secondary | ICD-10-CM | POA: Diagnosis not present

## 2022-07-18 DIAGNOSIS — N2581 Secondary hyperparathyroidism of renal origin: Secondary | ICD-10-CM | POA: Diagnosis not present

## 2022-07-18 DIAGNOSIS — Z992 Dependence on renal dialysis: Secondary | ICD-10-CM | POA: Diagnosis not present

## 2022-07-18 DIAGNOSIS — N186 End stage renal disease: Secondary | ICD-10-CM | POA: Diagnosis not present

## 2022-07-20 DIAGNOSIS — Z992 Dependence on renal dialysis: Secondary | ICD-10-CM | POA: Diagnosis not present

## 2022-07-20 DIAGNOSIS — N186 End stage renal disease: Secondary | ICD-10-CM | POA: Diagnosis not present

## 2022-07-20 DIAGNOSIS — N2581 Secondary hyperparathyroidism of renal origin: Secondary | ICD-10-CM | POA: Diagnosis not present

## 2022-07-21 DIAGNOSIS — Z992 Dependence on renal dialysis: Secondary | ICD-10-CM | POA: Diagnosis not present

## 2022-07-21 DIAGNOSIS — Z01818 Encounter for other preprocedural examination: Secondary | ICD-10-CM | POA: Diagnosis not present

## 2022-07-21 DIAGNOSIS — N186 End stage renal disease: Secondary | ICD-10-CM | POA: Diagnosis not present

## 2022-07-21 DIAGNOSIS — E1122 Type 2 diabetes mellitus with diabetic chronic kidney disease: Secondary | ICD-10-CM | POA: Diagnosis not present

## 2022-07-21 DIAGNOSIS — E1121 Type 2 diabetes mellitus with diabetic nephropathy: Secondary | ICD-10-CM | POA: Diagnosis not present

## 2022-07-21 DIAGNOSIS — Z125 Encounter for screening for malignant neoplasm of prostate: Secondary | ICD-10-CM | POA: Diagnosis not present

## 2022-07-21 DIAGNOSIS — Z7682 Awaiting organ transplant status: Secondary | ICD-10-CM | POA: Diagnosis not present

## 2022-07-21 DIAGNOSIS — I120 Hypertensive chronic kidney disease with stage 5 chronic kidney disease or end stage renal disease: Secondary | ICD-10-CM | POA: Diagnosis not present

## 2022-07-21 DIAGNOSIS — Z1159 Encounter for screening for other viral diseases: Secondary | ICD-10-CM | POA: Diagnosis not present

## 2022-07-22 DIAGNOSIS — N2581 Secondary hyperparathyroidism of renal origin: Secondary | ICD-10-CM | POA: Diagnosis not present

## 2022-07-22 DIAGNOSIS — N186 End stage renal disease: Secondary | ICD-10-CM | POA: Diagnosis not present

## 2022-07-22 DIAGNOSIS — Z992 Dependence on renal dialysis: Secondary | ICD-10-CM | POA: Diagnosis not present

## 2022-07-24 ENCOUNTER — Telehealth: Payer: Self-pay | Admitting: Cardiology

## 2022-07-24 ENCOUNTER — Other Ambulatory Visit: Payer: Self-pay | Admitting: Endocrinology

## 2022-07-24 ENCOUNTER — Encounter: Payer: Self-pay | Admitting: Cardiology

## 2022-07-24 DIAGNOSIS — N186 End stage renal disease: Secondary | ICD-10-CM

## 2022-07-24 DIAGNOSIS — I2510 Atherosclerotic heart disease of native coronary artery without angina pectoris: Secondary | ICD-10-CM

## 2022-07-24 DIAGNOSIS — E1129 Type 2 diabetes mellitus with other diabetic kidney complication: Secondary | ICD-10-CM

## 2022-07-24 DIAGNOSIS — I120 Hypertensive chronic kidney disease with stage 5 chronic kidney disease or end stage renal disease: Secondary | ICD-10-CM

## 2022-07-24 DIAGNOSIS — I5020 Unspecified systolic (congestive) heart failure: Secondary | ICD-10-CM

## 2022-07-24 DIAGNOSIS — Z01810 Encounter for preprocedural cardiovascular examination: Secondary | ICD-10-CM

## 2022-07-24 DIAGNOSIS — D631 Anemia in chronic kidney disease: Secondary | ICD-10-CM

## 2022-07-24 NOTE — Telephone Encounter (Signed)
Lynden Ang called in on behalf of the patient. She states he is on the [facility] list and they are requiring that he has a stress test and echocardiogram.

## 2022-07-24 NOTE — Telephone Encounter (Signed)
Error

## 2022-07-25 DIAGNOSIS — Z992 Dependence on renal dialysis: Secondary | ICD-10-CM | POA: Diagnosis not present

## 2022-07-25 DIAGNOSIS — N186 End stage renal disease: Secondary | ICD-10-CM | POA: Diagnosis not present

## 2022-07-25 DIAGNOSIS — N2581 Secondary hyperparathyroidism of renal origin: Secondary | ICD-10-CM | POA: Diagnosis not present

## 2022-07-25 NOTE — Addendum Note (Signed)
Addended by: Eleonore Chiquito on: 07/25/2022 03:23 PM   Modules accepted: Orders

## 2022-07-25 NOTE — Telephone Encounter (Signed)
Advised that we are waiting on Dr. Bing Matter to respond to original message.

## 2022-07-25 NOTE — Addendum Note (Signed)
Addended by: Eleonore Chiquito on: 07/25/2022 03:35 PM   Modules accepted: Orders

## 2022-07-25 NOTE — Telephone Encounter (Signed)
Follow Up:    Lynden Ang called back to see if Dr Bing Matter had consented to ordering these two tests

## 2022-07-27 DIAGNOSIS — N186 End stage renal disease: Secondary | ICD-10-CM | POA: Diagnosis not present

## 2022-07-27 DIAGNOSIS — Z992 Dependence on renal dialysis: Secondary | ICD-10-CM | POA: Diagnosis not present

## 2022-07-27 DIAGNOSIS — N2581 Secondary hyperparathyroidism of renal origin: Secondary | ICD-10-CM | POA: Diagnosis not present

## 2022-07-29 DIAGNOSIS — Z992 Dependence on renal dialysis: Secondary | ICD-10-CM | POA: Diagnosis not present

## 2022-07-29 DIAGNOSIS — N186 End stage renal disease: Secondary | ICD-10-CM | POA: Diagnosis not present

## 2022-07-29 DIAGNOSIS — N2581 Secondary hyperparathyroidism of renal origin: Secondary | ICD-10-CM | POA: Diagnosis not present

## 2022-08-01 DIAGNOSIS — Z992 Dependence on renal dialysis: Secondary | ICD-10-CM | POA: Diagnosis not present

## 2022-08-01 DIAGNOSIS — N2581 Secondary hyperparathyroidism of renal origin: Secondary | ICD-10-CM | POA: Diagnosis not present

## 2022-08-01 DIAGNOSIS — N186 End stage renal disease: Secondary | ICD-10-CM | POA: Diagnosis not present

## 2022-08-02 ENCOUNTER — Telehealth (HOSPITAL_COMMUNITY): Payer: Self-pay | Admitting: *Deleted

## 2022-08-02 ENCOUNTER — Ambulatory Visit
Admission: RE | Admit: 2022-08-02 | Discharge: 2022-08-02 | Disposition: A | Discharge status: Home / Self Care | Payer: Medicare HMO | Source: Ambulatory Visit | Attending: Endocrinology | Admitting: Endocrinology

## 2022-08-02 DIAGNOSIS — D631 Anemia in chronic kidney disease: Secondary | ICD-10-CM

## 2022-08-02 DIAGNOSIS — N186 End stage renal disease: Secondary | ICD-10-CM | POA: Diagnosis not present

## 2022-08-02 DIAGNOSIS — I120 Hypertensive chronic kidney disease with stage 5 chronic kidney disease or end stage renal disease: Secondary | ICD-10-CM

## 2022-08-02 DIAGNOSIS — K5730 Diverticulosis of large intestine without perforation or abscess without bleeding: Secondary | ICD-10-CM | POA: Diagnosis not present

## 2022-08-02 DIAGNOSIS — E1129 Type 2 diabetes mellitus with other diabetic kidney complication: Secondary | ICD-10-CM | POA: Diagnosis not present

## 2022-08-02 DIAGNOSIS — Z992 Dependence on renal dialysis: Secondary | ICD-10-CM | POA: Diagnosis not present

## 2022-08-02 DIAGNOSIS — K7689 Other specified diseases of liver: Secondary | ICD-10-CM | POA: Diagnosis not present

## 2022-08-02 DIAGNOSIS — N3289 Other specified disorders of bladder: Secondary | ICD-10-CM | POA: Diagnosis not present

## 2022-08-02 DIAGNOSIS — K8020 Calculus of gallbladder without cholecystitis without obstruction: Secondary | ICD-10-CM | POA: Diagnosis not present

## 2022-08-02 MED ORDER — IOPAMIDOL (ISOVUE-300) INJECTION 61%
100.0000 mL | Freq: Once | INTRAVENOUS | Status: AC | PRN
  Administered 2022-08-02: 100 mL via INTRAVENOUS

## 2022-08-02 NOTE — Telephone Encounter (Signed)
Left message on voicemail per DPR in reference to upcoming appointment scheduled on 08/09/2022 at 7:45 with detailed instructions given per Myocardial Perfusion Study Information Sheet for the test. LM to arrive 15 minutes early, and that it is imperative to arrive on time for appointment to keep from having the test rescheduled. If you need to cancel or reschedule your appointment, please call the office within 24 hours of your appointment. Failure to do so may result in a cancellation of your appointment, and a $50 no show fee. Phone number given for call back for any questions.

## 2022-08-03 DIAGNOSIS — Z992 Dependence on renal dialysis: Secondary | ICD-10-CM | POA: Diagnosis not present

## 2022-08-03 DIAGNOSIS — N186 End stage renal disease: Secondary | ICD-10-CM | POA: Diagnosis not present

## 2022-08-03 DIAGNOSIS — N2581 Secondary hyperparathyroidism of renal origin: Secondary | ICD-10-CM | POA: Diagnosis not present

## 2022-08-05 DIAGNOSIS — Z992 Dependence on renal dialysis: Secondary | ICD-10-CM | POA: Diagnosis not present

## 2022-08-05 DIAGNOSIS — N2581 Secondary hyperparathyroidism of renal origin: Secondary | ICD-10-CM | POA: Diagnosis not present

## 2022-08-05 DIAGNOSIS — N186 End stage renal disease: Secondary | ICD-10-CM | POA: Diagnosis not present

## 2022-08-08 DIAGNOSIS — N186 End stage renal disease: Secondary | ICD-10-CM | POA: Diagnosis not present

## 2022-08-08 DIAGNOSIS — Z992 Dependence on renal dialysis: Secondary | ICD-10-CM | POA: Diagnosis not present

## 2022-08-08 DIAGNOSIS — N2581 Secondary hyperparathyroidism of renal origin: Secondary | ICD-10-CM | POA: Diagnosis not present

## 2022-08-09 ENCOUNTER — Ambulatory Visit: Payer: Medicare HMO | Attending: Cardiology

## 2022-08-09 DIAGNOSIS — I2510 Atherosclerotic heart disease of native coronary artery without angina pectoris: Secondary | ICD-10-CM | POA: Diagnosis not present

## 2022-08-09 DIAGNOSIS — Z01810 Encounter for preprocedural cardiovascular examination: Secondary | ICD-10-CM | POA: Diagnosis not present

## 2022-08-09 LAB — MYOCARDIAL PERFUSION IMAGING
LV dias vol: 142 mL (ref 62–150)
LV sys vol: 67 mL
Nuc Stress EF: 53 %
Peak HR: 67 {beats}/min
Rest HR: 54 {beats}/min
Rest Nuclear Isotope Dose: 10.3 mCi
SDS: 1
SRS: 0
SSS: 1
ST Depression (mm): 0 mm
Stress Nuclear Isotope Dose: 30.4 mCi
TID: 1.11

## 2022-08-09 MED ORDER — TECHNETIUM TC 99M TETROFOSMIN IV KIT
30.4000 | PACK | Freq: Once | INTRAVENOUS | Status: AC | PRN
  Administered 2022-08-09: 30.4 via INTRAVENOUS

## 2022-08-09 MED ORDER — REGADENOSON 0.4 MG/5ML IV SOLN
0.4000 mg | Freq: Once | INTRAVENOUS | Status: AC
Start: 2022-08-09 — End: 2022-08-09
  Administered 2022-08-09: 0.4 mg via INTRAVENOUS

## 2022-08-09 MED ORDER — TECHNETIUM TC 99M TETROFOSMIN IV KIT
10.3000 | PACK | Freq: Once | INTRAVENOUS | Status: AC | PRN
  Administered 2022-08-09: 10.3 via INTRAVENOUS

## 2022-08-11 ENCOUNTER — Telehealth: Payer: Self-pay

## 2022-08-11 NOTE — Telephone Encounter (Signed)
Sent message to front desk to make appt to discuss stress test. Routed to PCP.

## 2022-08-12 DIAGNOSIS — N186 End stage renal disease: Secondary | ICD-10-CM | POA: Diagnosis not present

## 2022-08-12 DIAGNOSIS — N2581 Secondary hyperparathyroidism of renal origin: Secondary | ICD-10-CM | POA: Diagnosis not present

## 2022-08-12 DIAGNOSIS — Z992 Dependence on renal dialysis: Secondary | ICD-10-CM | POA: Diagnosis not present

## 2022-08-15 DIAGNOSIS — N186 End stage renal disease: Secondary | ICD-10-CM | POA: Diagnosis not present

## 2022-08-15 DIAGNOSIS — N2581 Secondary hyperparathyroidism of renal origin: Secondary | ICD-10-CM | POA: Diagnosis not present

## 2022-08-15 DIAGNOSIS — Z992 Dependence on renal dialysis: Secondary | ICD-10-CM | POA: Diagnosis not present

## 2022-08-16 ENCOUNTER — Encounter: Payer: Self-pay | Admitting: Cardiology

## 2022-08-17 ENCOUNTER — Ambulatory Visit: Payer: Medicare HMO | Attending: Cardiology | Admitting: Cardiology

## 2022-08-17 ENCOUNTER — Encounter: Payer: Self-pay | Admitting: Cardiology

## 2022-08-17 VITALS — BP 136/52 | HR 64 | Ht 69.0 in | Wt 179.2 lb

## 2022-08-17 DIAGNOSIS — R0609 Other forms of dyspnea: Secondary | ICD-10-CM

## 2022-08-17 DIAGNOSIS — E1122 Type 2 diabetes mellitus with diabetic chronic kidney disease: Secondary | ICD-10-CM | POA: Diagnosis not present

## 2022-08-17 DIAGNOSIS — Z794 Long term (current) use of insulin: Secondary | ICD-10-CM | POA: Diagnosis not present

## 2022-08-17 DIAGNOSIS — I10 Essential (primary) hypertension: Secondary | ICD-10-CM | POA: Diagnosis not present

## 2022-08-17 DIAGNOSIS — N2581 Secondary hyperparathyroidism of renal origin: Secondary | ICD-10-CM | POA: Diagnosis not present

## 2022-08-17 DIAGNOSIS — I2510 Atherosclerotic heart disease of native coronary artery without angina pectoris: Secondary | ICD-10-CM | POA: Diagnosis not present

## 2022-08-17 DIAGNOSIS — N186 End stage renal disease: Secondary | ICD-10-CM | POA: Diagnosis not present

## 2022-08-17 DIAGNOSIS — Z992 Dependence on renal dialysis: Secondary | ICD-10-CM

## 2022-08-17 NOTE — Progress Notes (Signed)
Cardiology Office Note:    Date:  08/17/2022   ID:  [name], DOB 12/17/1950, MRN [id number]  PCP:  Adrian Prince, MD  Cardiologist:  Gypsy Balsam, MD    Referring MD: Adrian Prince, MD   Chief Complaint  Patient presents with   Results    History of Present Illness:    Justin [name] is a 72 y.o. male with past medical history significant for end-stage kidney disease, he is on hemodialysis, coronary artery disease, cardiac catheterization done in April 28, 2020 showed 25% stenosis of proximal 25% distal LAD, diagonal branch 20% stenosis, ramus 10% stenosis, RCA mid 45% stenosis, PDA 75% stenosis, posterolateral branch 70% stenosis.  He was sent to [place] for possibility of stress test to evaluate him for kidney transplant.  Stress test has been performed and show ischemia involving inferior wall which is in correlation to her prior cardiac catheterization finding with 75% stenosis of PDA.  Overall she is doing very well.  She is completely asymptomatic, denies have any chest pain tightness squeezing pressure burning chest.  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  Overall he is fine no dizziness no passing out no syncope  Past Medical History:  Diagnosis Date   BPH (benign prostatic hyperplasia)    Chronic kidney disease (CKD), stage IV (severe) (HCC)    Diabetes (HCC)    Dysuria    Erectile dysfunction    Hyperlipemia    Hypertension    Pinna disorder, left    Renal disorder    Wears glasses     Past Surgical History:  Procedure Laterality Date   AV FISTULA PLACEMENT Left 04/30/2018   Procedure: CREATION OF RADIOCEPHALIC ARTERIOVENOUS FISTULA LEFT ARM;  Surgeon: Sherren Kerns, MD;  Location: [facility] OR;  Service: Vascular;  Laterality: Left;   AV FISTULA PLACEMENT Left 08/28/2018   Procedure: ARTERIOVENOUS (AV) BRACHIOCEPHALIC FISTULA CREATION LEFT UPPER ARM;  Surgeon: Cephus Shelling, MD;  Location: [facility] OR;  Service:  Vascular;  Laterality: Left;   IR FLUORO GUIDE CV LINE RIGHT  04/29/2018   IR US GUIDE VASC ACCESS RIGHT  04/29/2018   RIGHT/LEFT HEART CATH AND CORONARY ANGIOGRAPHY N/A 05/01/2018   Procedure: RIGHT/LEFT HEART CATH AND CORONARY ANGIOGRAPHY;  Surgeon: Lyn Records, MD;  Location: [facility] INVASIVE CV LAB;  Service: Cardiovascular;  Laterality: N/A;   subdural hematoma Right 07/2020    Current Medications: Current Meds  Medication Sig   amLODipine (NORVASC) 10 MG tablet Take 1 tablet (10 mg total) by mouth daily.   aspirin EC 81 MG tablet Take 81 mg by mouth daily. Swallow whole.   cyanocobalamin (VITAMIN B12) 500 MCG tablet Take 500 mcg by mouth daily. Patient gf unsure of dose.   ferric citrate (AURYXIA) 1 GM 210 MG(Fe) tablet Take 420 mg by mouth 3 (three) times daily with meals.   hydrALAZINE (APRESOLINE) 25 MG tablet Take 25 mg by mouth 3 (three) times daily.   isosorbide mononitrate (IMDUR) 30 MG 24 hr tablet TAKE 1 TABLET BY MOUTH IN THE MORNING ON  NON  DIALYSIS  DAYS (Patient taking differently: Take 30 mg by mouth daily. TAKE 1 TABLET BY MOUTH IN THE MORNING ON  NON  DIALYSIS  DAYS)   metoprolol succinate (TOPROL-XL) 50 MG 24 hr tablet Take 50 mg by mouth daily. Take 2 tablets 2xs a day mon,wed,fri,sun   nitroGLYCERIN (NITROSTAT) 0.4 MG SL tablet Place 0.4 mg under  the tongue every 5 (five) minutes as needed for chest pain.   NOVOLIN 70/30 RELION (70-30) 100 UNIT/ML injection Inject 20 Units into the skin daily. (Patient taking differently: Inject 20 Units into the skin See admin instructions. Inject 20 units subcutaneously with breakfast on Sunday, Monday, Wednesday, Friday; inject 20 units with lunch on Tuesday, Thursday, Saturday (dialysis days))   rosuvastatin (CRESTOR) 40 MG tablet Take 40 mg by mouth daily.     Allergies:   Patient has no known allergies.   Social History   Socioeconomic History   Marital status: Divorced    Spouse name: Not on file   Number of children: 1    Years of education: Not on file   Highest education level: Not on file  Occupational History   Not on file  Tobacco Use   Smoking status: Never   Smokeless tobacco: Never  Vaping Use   Vaping status: Never Used  Substance and Sexual Activity   Alcohol use: Not Currently   Drug use: Never   Sexual activity: Not Currently  Other Topics Concern   Not on file  Social History Narrative   Not on file   Social Determinants of Health   Financial Resource Strain: Low Risk  (07/13/2020)   Received [facility]   Overall Financial Resource Strain (CARDIA)    Difficulty of Paying Living Expenses: Not very hard  Food Insecurity: Patient Declined (06/05/2022)   Hunger Vital Sign    Worried About Running Out of Food in the Last Year: Patient declined    Ran Out of Food in the Last Year: Patient declined  Transportation Needs: No Transportation Needs (08/03/2021)   PRAPARE - Administrator, Civil Service (Medical): No    Lack of Transportation (Non-Medical): No  Physical Activity: Not on file  Stress: Not on file  Social Connections: Not on file     Family History: The patient's family history includes Diabetes in his father; Stroke in his mother. ROS:   Please see the history of present illness.    All 14 point review of systems negative except as described per history of present illness  EKGs/Labs/Other Studies Reviewed:         Recent Labs: 06/05/2022: ALT 27; B Natriuretic Peptide 1,835.7 06/08/2022: BUN 53; Creatinine, Ser 8.58; Hemoglobin 9.6; Platelets 259; Potassium 3.3; Sodium 137  Recent Lipid Panel    Component Value Date/Time   CHOL 109 04/25/2018 0429   TRIG 73 04/25/2018 0429   HDL 34 (L) 04/25/2018 0429   CHOLHDL 3.2 04/25/2018 0429   VLDL 15 04/25/2018 0429   LDLCALC 60 04/25/2018 0429   LDLDIRECT 32 06/05/2019 1639    Physical Exam:    VS:  BP (!) 136/52 (BP Location: Right Arm, Patient Position: Sitting)   Pulse 64   Ht  5\' 9"  (1.753 m)   Wt 179 lb 3.2 oz (81.3 kg)   SpO2 98%   BMI 26.46 kg/m     Wt Readings from Last 3 Encounters:  08/17/22 179 lb 3.2 oz (81.3 kg)  08/09/22 151 lb (68.5 kg)  06/08/22 151 lb 7.3 oz (68.7 kg)     GEN:  Well nourished, well developed in no acute distress HEENT: Normal NECK: No JVD; No carotid bruits LYMPHATICS: No lymphadenopathy CARDIAC: RRR, systolic murmur grade 2/6 to 3/6 best heard right upper portion of the sternum, no rubs, no gallops RESPIRATORY:  Clear to auscultation without rales, wheezing or rhonchi  ABDOMEN: Soft, non-tender, non-distended MUSCULOSKELETAL:  No edema; No deformity  SKIN: Warm and dry LOWER EXTREMITIES: no swelling NEUROLOGIC:  Alert and oriented x 3 PSYCHIATRIC:  Normal affect   ASSESSMENT:    1. Coronary artery disease involving native coronary artery of native heart without angina pectoris   2. Primary hypertension   3. Type 2 diabetes mellitus with chronic kidney disease on chronic dialysis, with long-term current use of insulin (HCC)   4. ESRD on hemodialysis [facility])    PLAN:    In order of problems listed above:  Coronary disease stress to show ischemia involving inferior wall which correlates with 7 that type percent stenosis that was previously identified in his PDA based on cardiac catheterization from April 2022, there is no new unexpected areas of ischemia.  He is completely asymptomatic we will continue present medications which include aspirin as an antiplatelet agent, beta-blocker and statin. Aortic stenosis.  I will repeat his echocardiogram to make sure aortic stenosis did not progress.  He is somebody with dialysis and there is plan is for the rapid progression of aortic stenosis therefore need to be verified and check. Type 2 diabetes followed by antimedicine team, last hemoglobin A1c from 06/05/2022 was 6.4 continue present management. Dyslipidemia she is on Crestor 1240 with LDL of 21 HDL 20 continue that  management   Medication Adjustments/Labs and Tests Ordered: Current medicines are reviewed at length with the patient today.  Concerns regarding medicines are outlined above.  No orders of the defined types were placed in this encounter.  Medication changes: No orders of the defined types were placed in this encounter.   Signed, Justin Lea, MD, [facility] 08/17/2022 3:41 PM    [facility]

## 2022-08-17 NOTE — Addendum Note (Signed)
Addended by: Baldo Ash D on: 08/17/2022 03:48 PM   Modules accepted: Orders

## 2022-08-17 NOTE — Patient Instructions (Signed)
Medication Instructions:  Your physician recommends that you continue on your current medications as directed. Please refer to the Current Medication list given to you today.  *If you need a refill on your cardiac medications before your next appointment, please call your pharmacy*   Lab Work: None Ordered If you have labs (blood work) drawn today and your tests are completely normal, you will receive your results only by: [place] (if you have MyChart) OR A paper copy in the mail If you have any lab test that is abnormal or we need to change your treatment, we will call you to review the results.   Testing/Procedures: Your physician has requested that you have an echocardiogram. Echocardiography is a painless test that uses sound waves to create images of your heart. It provides your doctor with information about the size and shape of your heart and how well your heart's chambers and valves are working. This procedure takes approximately one hour. There are no restrictions for this procedure. Please do NOT wear cologne, perfume, aftershave, or lotions (deodorant is allowed). Please arrive 15 minutes prior to your appointment time.    Follow-Up: At [facility], you and your health needs are our priority.  As part of our continuing mission to provide you with exceptional heart care, we have created designated Provider Care Teams.  These Care Teams include your primary Cardiologist (physician) and Advanced Practice Providers (APPs -  Physician Assistants and Nurse Practitioners) who all work together to provide you with the care you need, when you need it.  We recommend signing up for the patient portal called "MyChart".  Sign up information is provided on this After Visit Summary.  MyChart is used to connect with patients for Virtual Visits (Telemedicine).  Patients are able to view lab/test results, encounter notes, upcoming appointments, etc.  Non-urgent messages can be sent to your  provider as well.   To learn more about what you can do with MyChart, go to [email or web address].    Your next appointment:   5 month(s)  The format for your next appointment:   In Person  Provider:   Jenne Campus, MD    Other Instructions NA

## 2022-08-19 DIAGNOSIS — N186 End stage renal disease: Secondary | ICD-10-CM | POA: Diagnosis not present

## 2022-08-19 DIAGNOSIS — Z992 Dependence on renal dialysis: Secondary | ICD-10-CM | POA: Diagnosis not present

## 2022-08-19 DIAGNOSIS — N2581 Secondary hyperparathyroidism of renal origin: Secondary | ICD-10-CM | POA: Diagnosis not present

## 2022-08-22 DIAGNOSIS — Z992 Dependence on renal dialysis: Secondary | ICD-10-CM | POA: Diagnosis not present

## 2022-08-22 DIAGNOSIS — N186 End stage renal disease: Secondary | ICD-10-CM | POA: Diagnosis not present

## 2022-08-22 DIAGNOSIS — N2581 Secondary hyperparathyroidism of renal origin: Secondary | ICD-10-CM | POA: Diagnosis not present

## 2022-08-24 DIAGNOSIS — Z992 Dependence on renal dialysis: Secondary | ICD-10-CM | POA: Diagnosis not present

## 2022-08-24 DIAGNOSIS — N186 End stage renal disease: Secondary | ICD-10-CM | POA: Diagnosis not present

## 2022-08-24 DIAGNOSIS — N2581 Secondary hyperparathyroidism of renal origin: Secondary | ICD-10-CM | POA: Diagnosis not present

## 2022-08-26 DIAGNOSIS — N186 End stage renal disease: Secondary | ICD-10-CM | POA: Diagnosis not present

## 2022-08-26 DIAGNOSIS — N2581 Secondary hyperparathyroidism of renal origin: Secondary | ICD-10-CM | POA: Diagnosis not present

## 2022-08-26 DIAGNOSIS — Z992 Dependence on renal dialysis: Secondary | ICD-10-CM | POA: Diagnosis not present

## 2022-08-29 DIAGNOSIS — N186 End stage renal disease: Secondary | ICD-10-CM | POA: Diagnosis not present

## 2022-08-29 DIAGNOSIS — Z992 Dependence on renal dialysis: Secondary | ICD-10-CM | POA: Diagnosis not present

## 2022-08-29 DIAGNOSIS — N2581 Secondary hyperparathyroidism of renal origin: Secondary | ICD-10-CM | POA: Diagnosis not present

## 2022-08-30 ENCOUNTER — Ambulatory Visit: Payer: Medicare HMO

## 2022-08-30 DIAGNOSIS — Z01810 Encounter for preprocedural cardiovascular examination: Secondary | ICD-10-CM

## 2022-08-30 DIAGNOSIS — I5030 Unspecified diastolic (congestive) heart failure: Secondary | ICD-10-CM

## 2022-08-30 DIAGNOSIS — I5020 Unspecified systolic (congestive) heart failure: Secondary | ICD-10-CM | POA: Diagnosis not present

## 2022-08-30 DIAGNOSIS — I082 Rheumatic disorders of both aortic and tricuspid valves: Secondary | ICD-10-CM

## 2022-08-30 DIAGNOSIS — I2510 Atherosclerotic heart disease of native coronary artery without angina pectoris: Secondary | ICD-10-CM | POA: Diagnosis not present

## 2022-08-30 DIAGNOSIS — I3139 Other pericardial effusion (noninflammatory): Secondary | ICD-10-CM

## 2022-08-30 LAB — ECHOCARDIOGRAM COMPLETE
AR max vel: 0.83 cm2
AV Area VTI: 0.85 cm2
AV Area mean vel: 0.8 cm2
AV Mean grad: 19 mmHg
AV Peak grad: 34.1 mmHg
Ao pk vel: 2.92 m/s
Area-P 1/2: 3.29 cm2
S' Lateral: 2.9 cm

## 2022-08-31 DIAGNOSIS — Z992 Dependence on renal dialysis: Secondary | ICD-10-CM | POA: Diagnosis not present

## 2022-08-31 DIAGNOSIS — N186 End stage renal disease: Secondary | ICD-10-CM | POA: Diagnosis not present

## 2022-08-31 DIAGNOSIS — N2581 Secondary hyperparathyroidism of renal origin: Secondary | ICD-10-CM | POA: Diagnosis not present

## 2022-09-01 ENCOUNTER — Telehealth: Payer: Self-pay

## 2022-09-01 NOTE — Telephone Encounter (Signed)
-----   Message from Gypsy Balsam sent at 08/31/2022 10:09 AM EDT ----- Echocardiogram showed preserved left ventricle ejection fraction, moderate left ventricle hypertrophy, look like aortic valve looks pretty bad.  He need to have a follow-up appointment to talk about options

## 2022-09-01 NOTE — Telephone Encounter (Signed)
Patient notified of results. Patient schedule on 09/06

## 2022-09-02 DIAGNOSIS — N186 End stage renal disease: Secondary | ICD-10-CM | POA: Diagnosis not present

## 2022-09-02 DIAGNOSIS — N2581 Secondary hyperparathyroidism of renal origin: Secondary | ICD-10-CM | POA: Diagnosis not present

## 2022-09-02 DIAGNOSIS — E1129 Type 2 diabetes mellitus with other diabetic kidney complication: Secondary | ICD-10-CM | POA: Diagnosis not present

## 2022-09-02 DIAGNOSIS — Z992 Dependence on renal dialysis: Secondary | ICD-10-CM | POA: Diagnosis not present

## 2022-09-05 DIAGNOSIS — Z992 Dependence on renal dialysis: Secondary | ICD-10-CM | POA: Diagnosis not present

## 2022-09-05 DIAGNOSIS — N2581 Secondary hyperparathyroidism of renal origin: Secondary | ICD-10-CM | POA: Diagnosis not present

## 2022-09-05 DIAGNOSIS — N186 End stage renal disease: Secondary | ICD-10-CM | POA: Diagnosis not present

## 2022-09-07 DIAGNOSIS — N186 End stage renal disease: Secondary | ICD-10-CM | POA: Diagnosis not present

## 2022-09-07 DIAGNOSIS — N2581 Secondary hyperparathyroidism of renal origin: Secondary | ICD-10-CM | POA: Diagnosis not present

## 2022-09-07 DIAGNOSIS — Z992 Dependence on renal dialysis: Secondary | ICD-10-CM | POA: Diagnosis not present

## 2022-09-08 ENCOUNTER — Ambulatory Visit: Payer: Medicare HMO | Attending: Cardiology | Admitting: Cardiology

## 2022-09-08 ENCOUNTER — Encounter: Payer: Self-pay | Admitting: Cardiology

## 2022-09-08 VITALS — BP 120/58 | HR 68 | Ht 69.0 in | Wt 178.0 lb

## 2022-09-08 DIAGNOSIS — Z973 Presence of spectacles and contact lenses: Secondary | ICD-10-CM

## 2022-09-08 DIAGNOSIS — R0603 Acute respiratory distress: Secondary | ICD-10-CM

## 2022-09-08 DIAGNOSIS — N184 Chronic kidney disease, stage 4 (severe): Secondary | ICD-10-CM

## 2022-09-08 DIAGNOSIS — I5020 Unspecified systolic (congestive) heart failure: Secondary | ICD-10-CM

## 2022-09-08 DIAGNOSIS — Z8679 Personal history of other diseases of the circulatory system: Secondary | ICD-10-CM

## 2022-09-08 DIAGNOSIS — N2581 Secondary hyperparathyroidism of renal origin: Secondary | ICD-10-CM

## 2022-09-08 DIAGNOSIS — I129 Hypertensive chronic kidney disease with stage 1 through stage 4 chronic kidney disease, or unspecified chronic kidney disease: Secondary | ICD-10-CM

## 2022-09-08 DIAGNOSIS — I10 Essential (primary) hypertension: Secondary | ICD-10-CM

## 2022-09-08 DIAGNOSIS — E1122 Type 2 diabetes mellitus with diabetic chronic kidney disease: Secondary | ICD-10-CM

## 2022-09-08 DIAGNOSIS — U071 COVID-19: Secondary | ICD-10-CM

## 2022-09-08 DIAGNOSIS — I350 Nonrheumatic aortic (valve) stenosis: Secondary | ICD-10-CM

## 2022-09-08 DIAGNOSIS — D689 Coagulation defect, unspecified: Secondary | ICD-10-CM | POA: Diagnosis not present

## 2022-09-08 DIAGNOSIS — Z4901 Encounter for fitting and adjustment of extracorporeal dialysis catheter: Secondary | ICD-10-CM

## 2022-09-08 DIAGNOSIS — D631 Anemia in chronic kidney disease: Secondary | ICD-10-CM

## 2022-09-08 DIAGNOSIS — H61102 Unspecified noninfective disorders of pinna, left ear: Secondary | ICD-10-CM

## 2022-09-08 DIAGNOSIS — E876 Hypokalemia: Secondary | ICD-10-CM

## 2022-09-08 DIAGNOSIS — I5021 Acute systolic (congestive) heart failure: Secondary | ICD-10-CM

## 2022-09-08 DIAGNOSIS — N186 End stage renal disease: Secondary | ICD-10-CM | POA: Diagnosis not present

## 2022-09-08 DIAGNOSIS — Z8616 Personal history of COVID-19: Secondary | ICD-10-CM

## 2022-09-08 DIAGNOSIS — R0602 Shortness of breath: Secondary | ICD-10-CM

## 2022-09-08 DIAGNOSIS — R197 Diarrhea, unspecified: Secondary | ICD-10-CM

## 2022-09-08 DIAGNOSIS — L299 Pruritus, unspecified: Secondary | ICD-10-CM

## 2022-09-08 DIAGNOSIS — D509 Iron deficiency anemia, unspecified: Secondary | ICD-10-CM

## 2022-09-08 DIAGNOSIS — J9601 Acute respiratory failure with hypoxia: Secondary | ICD-10-CM

## 2022-09-08 DIAGNOSIS — R52 Pain, unspecified: Secondary | ICD-10-CM

## 2022-09-08 DIAGNOSIS — Z992 Dependence on renal dialysis: Secondary | ICD-10-CM | POA: Diagnosis not present

## 2022-09-08 DIAGNOSIS — I132 Hypertensive heart and chronic kidney disease with heart failure and with stage 5 chronic kidney disease, or end stage renal disease: Secondary | ICD-10-CM

## 2022-09-08 DIAGNOSIS — N289 Disorder of kidney and ureter, unspecified: Secondary | ICD-10-CM

## 2022-09-08 DIAGNOSIS — R300 Dysuria: Secondary | ICD-10-CM

## 2022-09-08 DIAGNOSIS — I161 Hypertensive emergency: Secondary | ICD-10-CM

## 2022-09-08 DIAGNOSIS — N400 Enlarged prostate without lower urinary tract symptoms: Secondary | ICD-10-CM

## 2022-09-08 DIAGNOSIS — N529 Male erectile dysfunction, unspecified: Secondary | ICD-10-CM

## 2022-09-08 DIAGNOSIS — I2510 Atherosclerotic heart disease of native coronary artery without angina pectoris: Secondary | ICD-10-CM

## 2022-09-08 DIAGNOSIS — S065XAA Traumatic subdural hemorrhage with loss of consciousness status unknown, initial encounter: Secondary | ICD-10-CM

## 2022-09-08 DIAGNOSIS — E441 Mild protein-calorie malnutrition: Secondary | ICD-10-CM

## 2022-09-08 DIAGNOSIS — E7800 Pure hypercholesterolemia, unspecified: Secondary | ICD-10-CM

## 2022-09-08 NOTE — Progress Notes (Signed)
Cardiology Office Note:    Date:  09/08/2022   ID:  [name], DOB [birth year], MRN [id number]  PCP:  Adrian Prince, MD  Cardiologist:  Gypsy Balsam, MD    Referring MD: Adrian Prince, MD   No chief complaint on file.   History of Present Illness:    Justin [name] is a 72 y.o. male with past medical history significant for end-stage kidney disease.  He is being on hemodialysis for last 4 years, he also coronary artery disease, cardiac catheterization done in April 28, 2020 revealed 25% proximal 25% distal LAD, diagonal branch had 40% stenosis, ramus 10% stenosis, RCA mid 45% stenosis PDA 75% stenosis posterior lateral branch 70% stenosis.  He was sent to [place] for stress test for possible qualification for kidney transplant.  Stress test was performed and show ischemia involving inferior wall which was in correlation to her known 75% stenosis of PDA.  After that he did have echocardiogram done trying to assess degree of aortic stenosis.  With his kidney dysfunction I anticipated rapid progression of his aortic stenosis.  His echocardiogram showed low flow low gradient severe aortic stenosis.  Calculated area 0.85 cm, mean gradient was only 19 mmHg, however demonstrated index 0.24 and stroke-volume was only 29.  I brought him today to the office to talk about this issue he tells me he does not have shortness of breath he does not have any chest pain he does not have any palpitations dizziness or passing out.  But at the same time he tells me that especially day of dialysis he feels very lousy many times at the end of dialysis he feels very dizzy.  Past Medical History:  Diagnosis Date   BPH (benign prostatic hyperplasia)    Chronic kidney disease (CKD), stage IV (severe) (HCC)    Diabetes (HCC)    Dysuria    Erectile dysfunction    Hyperlipemia    Hypertension    Pinna disorder, left    Renal disorder    Wears glasses     Past Surgical History:  Procedure Laterality Date   AV  FISTULA PLACEMENT Left 04/30/2018   Procedure: CREATION OF RADIOCEPHALIC ARTERIOVENOUS FISTULA LEFT ARM;  Surgeon: Sherren Kerns, MD;  Location: [facility] OR;  Service: Vascular;  Laterality: Left;   AV FISTULA PLACEMENT Left 08/28/2018   Procedure: ARTERIOVENOUS (AV) BRACHIOCEPHALIC FISTULA CREATION LEFT UPPER ARM;  Surgeon: Cephus Shelling, MD;  Location: [facility] OR;  Service: Vascular;  Laterality: Left;   IR FLUORO GUIDE CV LINE RIGHT  04/29/2018   IR US GUIDE VASC ACCESS RIGHT  04/29/2018   RIGHT/LEFT HEART CATH AND CORONARY ANGIOGRAPHY N/A 05/01/2018   Procedure: RIGHT/LEFT HEART CATH AND CORONARY ANGIOGRAPHY;  Surgeon: Lyn Records, MD;  Location: [facility] INVASIVE CV LAB;  Service: Cardiovascular;  Laterality: N/A;   subdural hematoma Right 07/2020    Current Medications: Current Meds  Medication Sig   amLODipine (NORVASC) 10 MG tablet Take 1 tablet (10 mg total) by mouth daily.   aspirin EC 81 MG tablet Take 81 mg by mouth daily. Swallow whole.   cyanocobalamin (VITAMIN B12) 500 MCG tablet Take 500 mcg by mouth daily. Patient gf unsure of dose.   ferric citrate (AURYXIA) 1 GM 210 MG(Fe) tablet Take 420 mg by mouth 3 (three) times daily with meals.   hydrALAZINE (APRESOLINE) 25 MG tablet Take 25 mg by mouth 2 (two) times daily.   isosorbide mononitrate (IMDUR) 30 MG 24 hr tablet TAKE 1 TABLET  BY MOUTH IN THE MORNING ON  NON  DIALYSIS  DAYS (Patient taking differently: Take 30 mg by mouth daily. TAKE 1 TABLET BY MOUTH IN THE MORNING ON  NON  DIALYSIS  DAYS)   metoprolol succinate (TOPROL-XL) 50 MG 24 hr tablet Take 50 mg by mouth daily.   nitroGLYCERIN (NITROSTAT) 0.4 MG SL tablet Place 0.4 mg under the tongue every 5 (five) minutes as needed for chest pain.   NOVOLIN 70/30 RELION (70-30) 100 UNIT/ML injection Inject 20 Units into the skin daily. (Patient taking differently: Inject 20 Units into the skin See admin instructions. Inject 20 units subcutaneously with breakfast on Sunday, Monday,  Wednesday, Friday; inject 20 units with lunch on Tuesday, Thursday, Saturday (dialysis days))   rosuvastatin (CRESTOR) 40 MG tablet Take 40 mg by mouth daily.     Allergies:   Patient has no known allergies.   Social History   Socioeconomic History   Marital status: Divorced    Spouse name: Not on file   Number of children: 1   Years of education: Not on file   Highest education level: Not on file  Occupational History   Not on file  Tobacco Use   Smoking status: Never   Smokeless tobacco: Never  Vaping Use   Vaping status: Never Used  Substance and Sexual Activity   Alcohol use: Not Currently   Drug use: Never   Sexual activity: Not Currently  Other Topics Concern   Not on file  Social History Narrative   Not on file   Social Determinants of Health   Financial Resource Strain: Low Risk  (07/13/2020)   Received [facility]   Overall Financial Resource Strain (CARDIA)    Difficulty of Paying Living Expenses: Not very hard  Food Insecurity: Patient Declined (06/05/2022)   Hunger Vital Sign    Worried About Running Out of Food in the Last Year: Patient declined    Ran Out of Food in the Last Year: Patient declined  Transportation Needs: No Transportation Needs (08/03/2021)   PRAPARE - Administrator, Civil Service (Medical): No    Lack of Transportation (Non-Medical): No  Physical Activity: Not on file  Stress: Not on file  Social Connections: Not on file     Family History: The patient's family history includes Diabetes in his father; Stroke in his mother. ROS:   Please see the history of present illness.    All 14 point review of systems negative except as described per history of present illness  EKGs/Labs/Other Studies Reviewed:         Recent Labs: 06/05/2022: ALT 27; B Natriuretic Peptide 1,835.7 06/08/2022: BUN 53; Creatinine, Ser 8.58; Hemoglobin 9.6; Platelets 259; Potassium 3.3; Sodium 137  Recent Lipid Panel     Component Value Date/Time   CHOL 109 04/25/2018 0429   TRIG 73 04/25/2018 0429   HDL 34 (L) 04/25/2018 0429   CHOLHDL 3.2 04/25/2018 0429   VLDL 15 04/25/2018 0429   LDLCALC 60 04/25/2018 0429   LDLDIRECT 32 06/05/2019 1639    Physical Exam:    VS:  BP (!) 120/58 (BP Location: Right Arm, Patient Position: Sitting)   Pulse 68   Ht 5\' 9"  (1.753 m)   Wt 178 lb (80.7 kg)   SpO2 96%   BMI 26.29 kg/m     Wt Readings from Last 3 Encounters:  09/08/22 178 lb (80.7 kg)  08/17/22 179 lb 3.2 oz (81.3 kg)  08/09/22  151 lb (68.5 kg)     GEN:  Well nourished, well developed in no acute distress HEENT: Normal NECK: No JVD; No carotid bruits LYMPHATICS: No lymphadenopathy CARDIAC: RRR, systolic extra murmur grade 2 through 3/6 best heard right upper portion of the sternum, I still can hear S2, no rubs, no gallops RESPIRATORY:  Clear to auscultation without rales, wheezing or rhonchi  ABDOMEN: Soft, non-tender, non-distended MUSCULOSKELETAL:  No edema; No deformity  SKIN: Warm and dry LOWER EXTREMITIES: no swelling NEUROLOGIC:  Alert and oriented x 3 PSYCHIATRIC:  Normal affect   ASSESSMENT:    1. Systolic congestive heart failure, unspecified HF chronicity (HCC)   2. Nonrheumatic aortic valve stenosis   3. Coronary artery disease involving native coronary artery of native heart without angina pectoris   4. Chronic kidney disease (CKD), stage IV (severe) (HCC)   5. Coagulation defect, unspecified (HCC)   6. ESRD on hemodialysis (HCC)   7. Primary hypertension    PLAN:    In order of problems listed above:  Aortic stenosis which appears to be low-flow low gradient severe aortic stenosis right now.  Look like he does not have much symptoms except of the day of dialysis feeling dizzy I will refer him to our structural heart disease team for consideration of evaluation for TAVI.  What make as want to proceed faster is the fact that he waiting to be placed on transplant list and  obviously with this degree of aortic stenosis he cannot be on the transplant list.  On top of that he does have some dizziness. Coronary artery disease with known PDA 75% stenosis with stress test confirming ischemia from that area.  Probably before TAVI if we decided to go that way cardiac catheterization will be repeated to recheck and he may required stent for this artery for the same reason in preparation for kidney transplant. Chronic kidney disease on dialysis doing well from that point review. Essential hypertension blood pressure well-controlled continue present management. Dyslipidemia I did review K PN dated from January show LDL 21 LDL HDL 20 he is on Crestor 40 which I will continue   Medication Adjustments/Labs and Tests Ordered: Current medicines are reviewed at length with the patient today.  Concerns regarding medicines are outlined above.  Orders Placed This Encounter  Procedures   Ambulatory referral to Cardiology   EKG 12-Lead   Medication changes: No orders of the defined types were placed in this encounter.   Signed, Georgeanna Lea, MD, [facility] 09/08/2022 12:15 PM    [facility]

## 2022-09-08 NOTE — Patient Instructions (Signed)
Medication Instructions:  Your physician recommends that you continue on your current medications as directed. Please refer to the Current Medication list given to you today.  *If you need a refill on your cardiac medications before your next appointment, please call your pharmacy*   Lab Work: None Ordered If you have labs (blood work) drawn today and your tests are completely normal, you will receive your results only by: MyChart Message (if you have MyChart) OR A paper copy in the mail If you have any lab test that is abnormal or we need to change your treatment, we will call you to review the results.   Testing/Procedures: None Ordered   Follow-Up: At [facility], you and your health needs are our priority.  As part of our continuing mission to provide you with exceptional heart care, we have created designated Provider Care Teams.  These Care Teams include your primary Cardiologist (physician) and Advanced Practice Providers (APPs -  Physician Assistants and Nurse Practitioners) who all work together to provide you with the care you need, when you need it.  We recommend signing up for the patient portal called "MyChart".  Sign up information is provided on this After Visit Summary.  MyChart is used to connect with patients for Virtual Visits (Telemedicine).  Patients are able to view lab/test results, encounter notes, upcoming appointments, etc.  Non-urgent messages can be sent to your provider as well.   To learn more about what you can do with MyChart, go to [email or web address].    Your next appointment:   3 month(s)  The format for your next appointment:   In Person  Provider:   Gypsy Balsam, MD    Other Instructions Referral to Dr. Excell Seltzer

## 2022-09-09 DIAGNOSIS — N2581 Secondary hyperparathyroidism of renal origin: Secondary | ICD-10-CM | POA: Diagnosis not present

## 2022-09-09 DIAGNOSIS — Z992 Dependence on renal dialysis: Secondary | ICD-10-CM | POA: Diagnosis not present

## 2022-09-09 DIAGNOSIS — N186 End stage renal disease: Secondary | ICD-10-CM | POA: Diagnosis not present

## 2022-09-12 DIAGNOSIS — N2581 Secondary hyperparathyroidism of renal origin: Secondary | ICD-10-CM | POA: Diagnosis not present

## 2022-09-12 DIAGNOSIS — N186 End stage renal disease: Secondary | ICD-10-CM | POA: Diagnosis not present

## 2022-09-12 DIAGNOSIS — Z992 Dependence on renal dialysis: Secondary | ICD-10-CM | POA: Diagnosis not present

## 2022-09-14 DIAGNOSIS — N2581 Secondary hyperparathyroidism of renal origin: Secondary | ICD-10-CM | POA: Diagnosis not present

## 2022-09-14 DIAGNOSIS — Z992 Dependence on renal dialysis: Secondary | ICD-10-CM | POA: Diagnosis not present

## 2022-09-14 DIAGNOSIS — N186 End stage renal disease: Secondary | ICD-10-CM | POA: Diagnosis not present

## 2022-09-16 DIAGNOSIS — Z992 Dependence on renal dialysis: Secondary | ICD-10-CM | POA: Diagnosis not present

## 2022-09-16 DIAGNOSIS — N2581 Secondary hyperparathyroidism of renal origin: Secondary | ICD-10-CM | POA: Diagnosis not present

## 2022-09-16 DIAGNOSIS — N186 End stage renal disease: Secondary | ICD-10-CM | POA: Diagnosis not present

## 2022-09-19 ENCOUNTER — Telehealth: Payer: Self-pay | Admitting: Cardiology

## 2022-09-19 DIAGNOSIS — Z992 Dependence on renal dialysis: Secondary | ICD-10-CM | POA: Diagnosis not present

## 2022-09-19 DIAGNOSIS — N186 End stage renal disease: Secondary | ICD-10-CM | POA: Diagnosis not present

## 2022-09-19 DIAGNOSIS — N2581 Secondary hyperparathyroidism of renal origin: Secondary | ICD-10-CM | POA: Diagnosis not present

## 2022-09-19 NOTE — Telephone Encounter (Signed)
Dr. Lorin Picket L. Sanoff at [facility] is requesting a callback from MD [facility] regarding this mutual pt. He stated he's being worked up for a Kidney transplant and would like to speak with MD to touch bases and speak about specifics since [facility] Matter sees him for Cardiac. Please advise

## 2022-09-20 ENCOUNTER — Other Ambulatory Visit: Payer: Medicare HMO

## 2022-09-20 ENCOUNTER — Encounter: Payer: Self-pay | Admitting: Cardiovascular Disease

## 2022-09-20 ENCOUNTER — Ambulatory Visit: Payer: Medicare HMO | Attending: Cardiovascular Disease | Admitting: Cardiovascular Disease

## 2022-09-20 VITALS — BP 152/60 | HR 60 | Ht 69.0 in | Wt 184.0 lb

## 2022-09-20 DIAGNOSIS — I350 Nonrheumatic aortic (valve) stenosis: Secondary | ICD-10-CM

## 2022-09-20 DIAGNOSIS — Z01810 Encounter for preprocedural cardiovascular examination: Secondary | ICD-10-CM | POA: Diagnosis not present

## 2022-09-20 NOTE — Patient Instructions (Signed)
Medication Instructions:  Your physician recommends that you continue on your current medications as directed. Please refer to the Current Medication list given to you today.  *If you need a refill on your cardiac medications before your next appointment, please call your pharmacy*  Lab Work: CBC, BMET (one week prior to procedure) If you have labs (blood work) drawn today and your tests are completely normal, you will receive your results only by: MyChart Message (if you have MyChart) OR A paper copy in the mail If you have any lab test that is abnormal or we need to change your treatment, we will call you to review the results.  Testing/Procedures: R & L Heart Catherization Your physician has requested that you have a cardiac catheterization. Cardiac catheterization is used to diagnose and/or treat various heart conditions. Doctors may recommend this procedure for a number of different reasons. The most common reason is to evaluate chest pain. Chest pain can be a symptom of coronary artery disease (CAD), and cardiac catheterization can show whether plaque is narrowing or blocking your heart's arteries. This procedure is also used to evaluate the valves, as well as measure the blood flow and oxygen levels in different parts of your heart. For further information please visit [email or web address] Please follow instruction sheet, as given.  TAVR CT's (you will be called to schedule)  Follow-Up: At [facility], you and your health needs are our priority.  As part of our continuing mission to provide you with exceptional heart care, we have created designated Provider Care Teams.  These Care Teams include your primary Cardiologist (physician) and Advanced Practice Providers (APPs -  Physician Assistants and Nurse Practitioners) who all work together to provide you with the care you need, when you need it.  Your next appointment:   Structural Team will follow-up  Provider:   Tonny Bollman, MD    Other Instructions       Cardiac/Peripheral Catheterization   You are scheduled for a Cardiac Catheterization on Friday, September 27 with Dr. Tonny Bollman.  1. Please arrive at the [facility] (Main Entrance A) at [facility]: [address] [place], [place] [address] at 8:30 AM (This time is two hour(s) before your procedure to ensure your preparation). Free valet parking service is available. You will check in at ADMITTING. The support person will be asked to wait in the waiting room.  It is OK to have someone drop you off and come back when you are ready to be discharged.        Special note: Every effort is made to have your procedure done on time. Please understand that emergencies sometimes delay scheduled procedures.  2. Diet: Do not eat solid foods after midnight.  You may have clear liquids until 5 AM the day of the procedure.  3. Labs: You will need to have blood drawn seven (7) days prior to procedure. You do not need to be fasting.  4. Medication instructions in preparation for your procedure:   Contrast Allergy: No  On the morning of your procedure, take Aspirin 81 mg and any morning medicines NOT listed above.  You may use sips of water.  5. Plan to go home the same day, you will only stay overnight if medically necessary. 6. You MUST have a responsible adult to drive you home. 7. An adult MUST be with you the first 24 hours after you arrive home. 8. Bring a current list of your medications, and the last  time and date medication taken. 9. Bring ID and current insurance cards. 10.Please wear clothes that are easy to get on and off and wear slip-on shoes.  Thank you for allowing [place] to care for you!   -- [place] [facility]

## 2022-09-20 NOTE — Progress Notes (Signed)
Pre Surgical Assessment: 5 M Walk Test  61M=16.35ft  5 Meter Walk Test- trial 1: 5.64 seconds 5 Meter Walk Test- trial 2: 5.54 seconds 5 Meter Walk Test- trial 3: 5.09 seconds 5 Meter Walk Test Average: 5.42 seconds

## 2022-09-20 NOTE — H&P (View-Only) (Signed)
Cardiology Office Note:    Date:  09/20/2022   ID:  Justin [name], DOB 02-04-1950, MRN [id number]  PCP:  Adrian Prince, MD   [facility] Providers Cardiologist:  Olga Millers, MD     Referring MD: Adrian Prince, MD   Chief Complaint  Patient presents with   Aortic Stenosis    History of Present Illness:    Justin [name] is a 72 y.o. male presenting for evaluation of aortic stenosis.  The patient has a history of distal vessel coronary artery disease.  He underwent cardiac catheterization in 2020 revealing nonobstructive proximal stenoses, but tight PDA stenosis.  Medical therapy was recommended at that time.  The patient has had end-stage renal disease and has been on hemodialysis for about 4-1/2 years now.  He dialyzes on Tuesdays, Thursdays, and Saturdays.  He complains of marked fatigue after dialysis, but he actually does pretty well on his nondialysis days.  He states on Sundays he is able to do quite a bit without any symptoms.  He is physically active and denies chest pain, shortness of breath, orthopnea, or PND.  He feels very fatigued on his dialysis days.  He denies episodes of hypotension during dialysis.  He states that his dry weight is recently been adjusted with hopes that he will have less fatigue following dialysis.  The patient is being considered for renal transplantation but there are concerns about both his CAD and his aortic stenosis.  A recent echocardiogram raise the possibility of paradoxical low-flow low gradient aortic stenosis.  Please see discussion below.  The patient has had regular dental care and denies any problems with his teeth.  Past Medical History:  Diagnosis Date   BPH (benign prostatic hyperplasia)    Chronic kidney disease (CKD), stage IV (severe) (HCC)    Diabetes (HCC)    Dysuria    Erectile dysfunction    Hyperlipemia    Hypertension    Pinna disorder, left    Renal disorder    Wears glasses     Past Surgical History:   Procedure Laterality Date   AV FISTULA PLACEMENT Left 04/30/2018   Procedure: CREATION OF RADIOCEPHALIC ARTERIOVENOUS FISTULA LEFT ARM;  Surgeon: Sherren Kerns, MD;  Location: [facility] OR;  Service: Vascular;  Laterality: Left;   AV FISTULA PLACEMENT Left 08/28/2018   Procedure: ARTERIOVENOUS (AV) BRACHIOCEPHALIC FISTULA CREATION LEFT UPPER ARM;  Surgeon: Cephus Shelling, MD;  Location: [facility] OR;  Service: Vascular;  Laterality: Left;   IR FLUORO GUIDE CV LINE RIGHT  04/29/2018   IR US GUIDE VASC ACCESS RIGHT  04/29/2018   RIGHT/LEFT HEART CATH AND CORONARY ANGIOGRAPHY N/A 05/01/2018   Procedure: RIGHT/LEFT HEART CATH AND CORONARY ANGIOGRAPHY;  Surgeon: Lyn Records, MD;  Location: [facility] INVASIVE CV LAB;  Service: Cardiovascular;  Laterality: N/A;   subdural hematoma Right 07/2020    Current Medications: Current Meds  Medication Sig   amLODipine (NORVASC) 10 MG tablet Take 1 tablet (10 mg total) by mouth daily.   aspirin EC 81 MG tablet Take 81 mg by mouth daily. Swallow whole.   cyanocobalamin (VITAMIN B12) 500 MCG tablet Take 500 mcg by mouth daily. Patient gf unsure of dose.   ferric citrate (AURYXIA) 1 GM 210 MG(Fe) tablet Take 420 mg by mouth 3 (three) times daily with meals.   hydrALAZINE (APRESOLINE) 25 MG tablet Take 25 mg by mouth 2 (two) times daily.   isosorbide mononitrate (IMDUR) 30 MG 24 hr tablet TAKE 1 TABLET BY  MOUTH IN THE MORNING ON  NON  DIALYSIS  DAYS (Patient taking differently: Take 30 mg by mouth daily. TAKE 1 TABLET BY MOUTH IN THE MORNING ON  NON  DIALYSIS  DAYS)   Methoxy PEG-Epoetin Beta (MIRCERA IJ) Mircera   metoprolol succinate (TOPROL-XL) 50 MG 24 hr tablet Take 50 mg by mouth daily.   nitroGLYCERIN (NITROSTAT) 0.4 MG SL tablet Place 0.4 mg under the tongue every 5 (five) minutes as needed for chest pain.   NOVOLIN 70/30 RELION (70-30) 100 UNIT/ML injection Inject 20 Units into the skin daily. (Patient taking differently: Inject 20 Units into the skin See  admin instructions. Inject 20 units subcutaneously with breakfast on Sunday, Monday, Wednesday, Friday; inject 20 units with lunch on Tuesday, Thursday, Saturday (dialysis days))   rosuvastatin (CRESTOR) 40 MG tablet Take 40 mg by mouth daily.     Allergies:   Patient has no known allergies.   Social History   Socioeconomic History   Marital status: Divorced    Spouse name: Not on file   Number of children: 1   Years of education: Not on file   Highest education level: Not on file  Occupational History   Not on file  Tobacco Use   Smoking status: Never   Smokeless tobacco: Never  Vaping Use   Vaping status: Never Used  Substance and Sexual Activity   Alcohol use: Not Currently   Drug use: Never   Sexual activity: Not Currently  Other Topics Concern   Not on file  Social History Narrative   Not on file   Social Determinants of Health   Financial Resource Strain: Low Risk  (07/13/2020)   Received from [facility], [facility]   Overall Financial Resource Strain (CARDIA)    Difficulty of Paying Living Expenses: Not very hard  Food Insecurity: Patient Declined (06/05/2022)   Hunger Vital Sign    Worried About Running Out of Food in the Last Year: Patient declined    Ran Out of Food in the Last Year: Patient declined  Transportation Needs: No Transportation Needs (08/03/2021)   PRAPARE - Administrator, Civil Service (Medical): No    Lack of Transportation (Non-Medical): No  Physical Activity: Not on file  Stress: Not on file  Social Connections: Not on file     Family History: The patient's family history includes Diabetes in his father; Stroke in his mother.  ROS:   Please see the history of present illness.    All other systems reviewed and are negative.  EKGs/Labs/Other Studies Reviewed:    The following studies were reviewed today: 2D echocardiogram:    1. Technically difficult study.   2. Left ventricular ejection fraction, by estimation,  is 60 to 65%.The  left ventricle has no regional wall motion abnormalities. There is  moderate concentric left ventricular hypertrophy. Left ventricular  diastolic parameters are consistent with Grade I   diastolic dysfunction (impaired relaxation). The average left ventricular  global longitudinal strain is -14.4 %   3. Right ventricular systolic function is normal. The right ventricular  size is normal.   4. LA volume index 45.4 mL/m. Left atrial size was severely dilated.   5. Trivial mitral valve regurgitation.   6. Low-flow low gradient aortic stenosis, paradoxical, severe by valve  area calculated, visually appears moderately stenotic. Aortic valve area,  by VTI measures 0.85 cm. Aortic valve mean gradient measures 19.0 mmHg.  Aortic valve Vmax measures 2.92 m/s   Stroke-volume  index 29.4 mL/m. Mild aortic insufficiency.   7. The inferior vena cava is normal in size with greater than 50%  respiratory variability, suggesting right atrial pressure of 3 mmHg.   8. A small to moderate size pericardial effusion is present. The  pericardial effusion is circumferential. There is no evidence of cardiac  tamponade.   Comparison(s): A prior study was performed on 04/12/2022. Changes from  prior study are noted. Pericardial effusion appears smaller. Aortic valve  Vmax was 2.37m/s.   Cardiac catheterization 2020: Obstructive coronary artery disease with absence of proximal severe CAD. Mid LAD 50 to 60%, apical LAD 90%, first diagonal 75%. Proximal circumflex 75% giving origin to 2 very small obtuse marginals, the first of which is totally occluded. Large dominant ramus intermedius supplying much of the territory of the circumflex. Dominant RCA with mid vessel 50% stenosis and proximal to mid 95% PDA. Mild pulmonary hypertension. Minimal aortic stenosis with approximately 9 mm peak to peak transvalvular gradient. Upper normal LVEDP, 18 mmHg.   RECOMMENDATIONS:   Aggressive secondary  risk factor modification.  LDL less than 70, hemoglobin A1c less than 7, and blood pressure 130/80 mmHg or less. 81 mg aspirin daily unless contraindicated. Anti-ischemic therapy as needed for chest discomfort.      Recent Labs: 06/05/2022: ALT 27; B Natriuretic Peptide 1,835.7 06/08/2022: BUN 53; Creatinine, Ser 8.58; Hemoglobin 9.6; Platelets 259; Potassium 3.3; Sodium 137  Recent Lipid Panel    Component Value Date/Time   CHOL 109 04/25/2018 0429   TRIG 73 04/25/2018 0429   HDL 34 (L) 04/25/2018 0429   CHOLHDL 3.2 04/25/2018 0429   VLDL 15 04/25/2018 0429   LDLCALC 60 04/25/2018 0429   LDLDIRECT 32 06/05/2019 1639     Risk Assessment/Calculations:           Physical Exam:    VS:  BP (!) 152/60 (BP Location: Right Arm, Patient Position: Sitting, Cuff Size: Normal)   Pulse 60   Ht 5\' 9"  (1.753 m)   Wt 184 lb (83.5 kg)   SpO2 96%   BMI 27.17 kg/m     Wt Readings from Last 3 Encounters:  09/20/22 184 lb (83.5 kg)  09/08/22 178 lb (80.7 kg)  08/17/22 179 lb 3.2 oz (81.3 kg)     GEN:  Well nourished, well developed in no acute distress HEENT: Normal NECK: No JVD; No carotid bruits LYMPHATICS: No lymphadenopathy CARDIAC: RRR, 3/6 mid peaking crescendo decrescendo murmur best heard at the apex but also appreciated at the right upper sternal border, A2 is audible RESPIRATORY:  Clear to auscultation without rales, wheezing or rhonchi  ABDOMEN: Soft, non-tender, non-distended MUSCULOSKELETAL:  No edema; No deformity  SKIN: Warm and dry NEUROLOGIC:  Alert and oriented x 3 PSYCHIATRIC:  Normal affect   ASSESSMENT:    1. Pre-procedural cardiovascular examination   2. Nonrheumatic aortic valve stenosis    PLAN:    In order of problems listed above:  Plan to proceed with right and left heart catheterization as well as a gated CTA of the heart and a CTA of the chest, abdomen, and pelvis.  Please see discussion below. I have reviewed the risks, indications, and  alternatives to cardiac catheterization, possible angioplasty, and stenting with the patient. Risks include but are not limited to bleeding, infection, vascular injury, stroke, myocardial infection, arrhythmia, kidney injury, radiation-related injury in the case of prolonged fluoroscopy use, emergency cardiac surgery, and death. The patient understands the risks of serious complication is 1-2 in 1000  with diagnostic cardiac cath and 1-2% or less with angioplasty/stenting.   The patient has moderate to severe aortic stenosis, possibly severe low-flow low gradient aortic stenosis.  His echocardiogram is reviewed and shows normal LVEF of 60 to 65%, normal RV function, severely calcified and moderately restricted aortic valve leaflets.  Peak transaortic velocity is 2.9 m/s, mean gradient is 19 mmHg, calculated valve area 0.85 cm, and stroke-volume index is 29 mL/m.  There may be underestimation of the LVOT flow, but the patient does have significant calcification and restriction of his aortic valve leaflets on 2D echo imaging.  Considering his status for renal transplantation, it is appropriate to perform further testing to sort out whether he in fact has severe low-flow low gradient aortic stenosis.  CTA studies will help with potential access options for TAVR, quantify aortic valve calcium score, and assess anatomic suitability for TAVR if indicated.  Cardiac catheterization will better define his coronary artery disease as it has been 4 years since his previous catheterization, and I think it will also be helpful to perform invasive hemodynamic assessment.  Once his studies are completed, our multidisciplinary heart valve team will review his case and we will provide further recommendations.  Fortunately the patient appears to be asymptomatic at this time.      Informed Consent   Shared Decision Making/Informed Consent The risks [stroke (1 in 1000), death (1 in 1000), kidney failure [usually temporary] (1 in  500), bleeding (1 in 200), allergic reaction [possibly serious] (1 in 200)], benefits (diagnostic support and management of coronary artery disease) and alternatives of a cardiac catheterization were discussed in detail with Mr. Malley and he is willing to proceed.       Medication Adjustments/Labs and Tests Ordered: Current medicines are reviewed at length with the patient today.  Concerns regarding medicines are outlined above.  Orders Placed This Encounter  Procedures   CBC   Basic metabolic panel   No orders of the defined types were placed in this encounter.   Patient Instructions  Medication Instructions:  Your physician recommends that you continue on your current medications as directed. Please refer to the Current Medication list given to you today.  *If you need a refill on your cardiac medications before your next appointment, please call your pharmacy*  Lab Work: CBC, BMET (one week prior to procedure) If you have labs (blood work) drawn today and your tests are completely normal, you will receive your results only by: MyChart Message (if you have MyChart) OR A paper copy in the mail If you have any lab test that is abnormal or we need to change your treatment, we will call you to review the results.  Testing/Procedures: R & L Heart Catherization Your physician has requested that you have a cardiac catheterization. Cardiac catheterization is used to diagnose and/or treat various heart conditions. Doctors may recommend this procedure for a number of different reasons. The most common reason is to evaluate chest pain. Chest pain can be a symptom of coronary artery disease (CAD), and cardiac catheterization can show whether plaque is narrowing or blocking your heart's arteries. This procedure is also used to evaluate the valves, as well as measure the blood flow and oxygen levels in different parts of your heart. For further information please visit [email or web address] Please  follow instruction sheet, as given.  TAVR CT's (you will be called to schedule)  Follow-Up: At [facility], you and your health needs are our priority.  As part of our  continuing mission to provide you with exceptional heart care, we have created designated Provider Care Teams.  These Care Teams include your primary Cardiologist (physician) and Advanced Practice Providers (APPs -  Physician Assistants and Nurse Practitioners) who all work together to provide you with the care you need, when you need it.  Your next appointment:   Structural Team will follow-up  Provider:   Tonny Bollman, MD    Other Instructions       Cardiac/Peripheral Catheterization   You are scheduled for a Cardiac Catheterization on Friday, September 27 with Dr. Tonny Bollman.  1. Please arrive at the [facility] (Main Entrance A) at [facility]: [address] [place], [place] [address] at 8:30 AM (This time is two hour(s) before your procedure to ensure your preparation). Free valet parking service is available. You will check in at ADMITTING. The support person will be asked to wait in the waiting room.  It is OK to have someone drop you off and come back when you are ready to be discharged.        Special note: Every effort is made to have your procedure done on time. Please understand that emergencies sometimes delay scheduled procedures.  2. Diet: Do not eat solid foods after midnight.  You may have clear liquids until 5 AM the day of the procedure.  3. Labs: You will need to have blood drawn seven (7) days prior to procedure. You do not need to be fasting.  4. Medication instructions in preparation for your procedure:   Contrast Allergy: No  On the morning of your procedure, take Aspirin 81 mg and any morning medicines NOT listed above.  You may use sips of water.  5. Plan to go home the same day, you will only stay overnight if medically necessary. 6. You MUST have a responsible  adult to drive you home. 7. An adult MUST be with you the first 24 hours after you arrive home. 8. Bring a current list of your medications, and the last time and date medication taken. 9. Bring ID and current insurance cards. 10.Please wear clothes that are easy to get on and off and wear slip-on shoes.  Thank you for allowing [place] to care for you!   -- [facility]    Signed, Tonny Bollman, MD  09/20/2022 5:35 PM    [facility]

## 2022-09-20 NOTE — Progress Notes (Signed)
Cardiology Office Note:    Date:  09/20/2022   ID:  Justin [name], DOB 02-04-1950, MRN [id number]  PCP:  Adrian Prince, MD   [facility] Providers Cardiologist:  Olga Millers, MD     Referring MD: Adrian Prince, MD   Chief Complaint  Patient presents with   Aortic Stenosis    History of Present Illness:    Justin [name] is a 72 y.o. male presenting for evaluation of aortic stenosis.  The patient has a history of distal vessel coronary artery disease.  He underwent cardiac catheterization in 2020 revealing nonobstructive proximal stenoses, but tight PDA stenosis.  Medical therapy was recommended at that time.  The patient has had end-stage renal disease and has been on hemodialysis for about 4-1/2 years now.  He dialyzes on Tuesdays, Thursdays, and Saturdays.  He complains of marked fatigue after dialysis, but he actually does pretty well on his nondialysis days.  He states on Sundays he is able to do quite a bit without any symptoms.  He is physically active and denies chest pain, shortness of breath, orthopnea, or PND.  He feels very fatigued on his dialysis days.  He denies episodes of hypotension during dialysis.  He states that his dry weight is recently been adjusted with hopes that he will have less fatigue following dialysis.  The patient is being considered for renal transplantation but there are concerns about both his CAD and his aortic stenosis.  A recent echocardiogram raise the possibility of paradoxical low-flow low gradient aortic stenosis.  Please see discussion below.  The patient has had regular dental care and denies any problems with his teeth.  Past Medical History:  Diagnosis Date   BPH (benign prostatic hyperplasia)    Chronic kidney disease (CKD), stage IV (severe) (HCC)    Diabetes (HCC)    Dysuria    Erectile dysfunction    Hyperlipemia    Hypertension    Pinna disorder, left    Renal disorder    Wears glasses     Past Surgical History:   Procedure Laterality Date   AV FISTULA PLACEMENT Left 04/30/2018   Procedure: CREATION OF RADIOCEPHALIC ARTERIOVENOUS FISTULA LEFT ARM;  Surgeon: Sherren Kerns, MD;  Location: [facility] OR;  Service: Vascular;  Laterality: Left;   AV FISTULA PLACEMENT Left 08/28/2018   Procedure: ARTERIOVENOUS (AV) BRACHIOCEPHALIC FISTULA CREATION LEFT UPPER ARM;  Surgeon: Cephus Shelling, MD;  Location: [facility] OR;  Service: Vascular;  Laterality: Left;   IR FLUORO GUIDE CV LINE RIGHT  04/29/2018   IR US GUIDE VASC ACCESS RIGHT  04/29/2018   RIGHT/LEFT HEART CATH AND CORONARY ANGIOGRAPHY N/A 05/01/2018   Procedure: RIGHT/LEFT HEART CATH AND CORONARY ANGIOGRAPHY;  Surgeon: Lyn Records, MD;  Location: [facility] INVASIVE CV LAB;  Service: Cardiovascular;  Laterality: N/A;   subdural hematoma Right 07/2020    Current Medications: Current Meds  Medication Sig   amLODipine (NORVASC) 10 MG tablet Take 1 tablet (10 mg total) by mouth daily.   aspirin EC 81 MG tablet Take 81 mg by mouth daily. Swallow whole.   cyanocobalamin (VITAMIN B12) 500 MCG tablet Take 500 mcg by mouth daily. Patient gf unsure of dose.   ferric citrate (AURYXIA) 1 GM 210 MG(Fe) tablet Take 420 mg by mouth 3 (three) times daily with meals.   hydrALAZINE (APRESOLINE) 25 MG tablet Take 25 mg by mouth 2 (two) times daily.   isosorbide mononitrate (IMDUR) 30 MG 24 hr tablet TAKE 1 TABLET BY  MOUTH IN THE MORNING ON  NON  DIALYSIS  DAYS (Patient taking differently: Take 30 mg by mouth daily. TAKE 1 TABLET BY MOUTH IN THE MORNING ON  NON  DIALYSIS  DAYS)   Methoxy PEG-Epoetin Beta (MIRCERA IJ) Mircera   metoprolol succinate (TOPROL-XL) 50 MG 24 hr tablet Take 50 mg by mouth daily.   nitroGLYCERIN (NITROSTAT) 0.4 MG SL tablet Place 0.4 mg under the tongue every 5 (five) minutes as needed for chest pain.   NOVOLIN 70/30 RELION (70-30) 100 UNIT/ML injection Inject 20 Units into the skin daily. (Patient taking differently: Inject 20 Units into the skin See  admin instructions. Inject 20 units subcutaneously with breakfast on Sunday, Monday, Wednesday, Friday; inject 20 units with lunch on Tuesday, Thursday, Saturday (dialysis days))   rosuvastatin (CRESTOR) 40 MG tablet Take 40 mg by mouth daily.     Allergies:   Patient has no known allergies.   Social History   Socioeconomic History   Marital status: Divorced    Spouse name: Not on file   Number of children: 1   Years of education: Not on file   Highest education level: Not on file  Occupational History   Not on file  Tobacco Use   Smoking status: Never   Smokeless tobacco: Never  Vaping Use   Vaping status: Never Used  Substance and Sexual Activity   Alcohol use: Not Currently   Drug use: Never   Sexual activity: Not Currently  Other Topics Concern   Not on file  Social History Narrative   Not on file   Social Determinants of Health   Financial Resource Strain: Low Risk  (07/13/2020)   Received from [facility], [facility]   Overall Financial Resource Strain (CARDIA)    Difficulty of Paying Living Expenses: Not very hard  Food Insecurity: Patient Declined (06/05/2022)   Hunger Vital Sign    Worried About Running Out of Food in the Last Year: Patient declined    Ran Out of Food in the Last Year: Patient declined  Transportation Needs: No Transportation Needs (08/03/2021)   PRAPARE - Administrator, Civil Service (Medical): No    Lack of Transportation (Non-Medical): No  Physical Activity: Not on file  Stress: Not on file  Social Connections: Not on file     Family History: The patient's family history includes Diabetes in his father; Stroke in his mother.  ROS:   Please see the history of present illness.    All other systems reviewed and are negative.  EKGs/Labs/Other Studies Reviewed:    The following studies were reviewed today: 2D echocardiogram:    1. Technically difficult study.   2. Left ventricular ejection fraction, by estimation,  is 60 to 65%.The  left ventricle has no regional wall motion abnormalities. There is  moderate concentric left ventricular hypertrophy. Left ventricular  diastolic parameters are consistent with Grade I   diastolic dysfunction (impaired relaxation). The average left ventricular  global longitudinal strain is -14.4 %   3. Right ventricular systolic function is normal. The right ventricular  size is normal.   4. LA volume index 45.4 mL/m. Left atrial size was severely dilated.   5. Trivial mitral valve regurgitation.   6. Low-flow low gradient aortic stenosis, paradoxical, severe by valve  area calculated, visually appears moderately stenotic. Aortic valve area,  by VTI measures 0.85 cm. Aortic valve mean gradient measures 19.0 mmHg.  Aortic valve Vmax measures 2.92 m/s   Stroke-volume  index 29.4 mL/m. Mild aortic insufficiency.   7. The inferior vena cava is normal in size with greater than 50%  respiratory variability, suggesting right atrial pressure of 3 mmHg.   8. A small to moderate size pericardial effusion is present. The  pericardial effusion is circumferential. There is no evidence of cardiac  tamponade.   Comparison(s): A prior study was performed on 04/12/2022. Changes from  prior study are noted. Pericardial effusion appears smaller. Aortic valve  Vmax was 2.37m/s.   Cardiac catheterization 2020: Obstructive coronary artery disease with absence of proximal severe CAD. Mid LAD 50 to 60%, apical LAD 90%, first diagonal 75%. Proximal circumflex 75% giving origin to 2 very small obtuse marginals, the first of which is totally occluded. Large dominant ramus intermedius supplying much of the territory of the circumflex. Dominant RCA with mid vessel 50% stenosis and proximal to mid 95% PDA. Mild pulmonary hypertension. Minimal aortic stenosis with approximately 9 mm peak to peak transvalvular gradient. Upper normal LVEDP, 18 mmHg.   RECOMMENDATIONS:   Aggressive secondary  risk factor modification.  LDL less than 70, hemoglobin A1c less than 7, and blood pressure 130/80 mmHg or less. 81 mg aspirin daily unless contraindicated. Anti-ischemic therapy as needed for chest discomfort.      Recent Labs: 06/05/2022: ALT 27; B Natriuretic Peptide 1,835.7 06/08/2022: BUN 53; Creatinine, Ser 8.58; Hemoglobin 9.6; Platelets 259; Potassium 3.3; Sodium 137  Recent Lipid Panel    Component Value Date/Time   CHOL 109 04/25/2018 0429   TRIG 73 04/25/2018 0429   HDL 34 (L) 04/25/2018 0429   CHOLHDL 3.2 04/25/2018 0429   VLDL 15 04/25/2018 0429   LDLCALC 60 04/25/2018 0429   LDLDIRECT 32 06/05/2019 1639     Risk Assessment/Calculations:           Physical Exam:    VS:  BP (!) 152/60 (BP Location: Right Arm, Patient Position: Sitting, Cuff Size: Normal)   Pulse 60   Ht 5\' 9"  (1.753 m)   Wt 184 lb (83.5 kg)   SpO2 96%   BMI 27.17 kg/m     Wt Readings from Last 3 Encounters:  09/20/22 184 lb (83.5 kg)  09/08/22 178 lb (80.7 kg)  08/17/22 179 lb 3.2 oz (81.3 kg)     GEN:  Well nourished, well developed in no acute distress HEENT: Normal NECK: No JVD; No carotid bruits LYMPHATICS: No lymphadenopathy CARDIAC: RRR, 3/6 mid peaking crescendo decrescendo murmur best heard at the apex but also appreciated at the right upper sternal border, A2 is audible RESPIRATORY:  Clear to auscultation without rales, wheezing or rhonchi  ABDOMEN: Soft, non-tender, non-distended MUSCULOSKELETAL:  No edema; No deformity  SKIN: Warm and dry NEUROLOGIC:  Alert and oriented x 3 PSYCHIATRIC:  Normal affect   ASSESSMENT:    1. Pre-procedural cardiovascular examination   2. Nonrheumatic aortic valve stenosis    PLAN:    In order of problems listed above:  Plan to proceed with right and left heart catheterization as well as a gated CTA of the heart and a CTA of the chest, abdomen, and pelvis.  Please see discussion below. I have reviewed the risks, indications, and  alternatives to cardiac catheterization, possible angioplasty, and stenting with the patient. Risks include but are not limited to bleeding, infection, vascular injury, stroke, myocardial infection, arrhythmia, kidney injury, radiation-related injury in the case of prolonged fluoroscopy use, emergency cardiac surgery, and death. The patient understands the risks of serious complication is 1-2 in 1000  with diagnostic cardiac cath and 1-2% or less with angioplasty/stenting.   The patient has moderate to severe aortic stenosis, possibly severe low-flow low gradient aortic stenosis.  His echocardiogram is reviewed and shows normal LVEF of 60 to 65%, normal RV function, severely calcified and moderately restricted aortic valve leaflets.  Peak transaortic velocity is 2.9 m/s, mean gradient is 19 mmHg, calculated valve area 0.85 cm, and stroke-volume index is 29 mL/m.  There may be underestimation of the LVOT flow, but the patient does have significant calcification and restriction of his aortic valve leaflets on 2D echo imaging.  Considering his status for renal transplantation, it is appropriate to perform further testing to sort out whether he in fact has severe low-flow low gradient aortic stenosis.  CTA studies will help with potential access options for TAVR, quantify aortic valve calcium score, and assess anatomic suitability for TAVR if indicated.  Cardiac catheterization will better define his coronary artery disease as it has been 4 years since his previous catheterization, and I think it will also be helpful to perform invasive hemodynamic assessment.  Once his studies are completed, our multidisciplinary heart valve team will review his case and we will provide further recommendations.  Fortunately the patient appears to be asymptomatic at this time.      Informed Consent   Shared Decision Making/Informed Consent The risks [stroke (1 in 1000), death (1 in 1000), kidney failure [usually temporary] (1 in  500), bleeding (1 in 200), allergic reaction [possibly serious] (1 in 200)], benefits (diagnostic support and management of coronary artery disease) and alternatives of a cardiac catheterization were discussed in detail with Justin [name] and he is willing to proceed.       Medication Adjustments/Labs and Tests Ordered: Current medicines are reviewed at length with the patient today.  Concerns regarding medicines are outlined above.  Orders Placed This Encounter  Procedures   CBC   Basic metabolic panel   No orders of the defined types were placed in this encounter.   Patient Instructions  Medication Instructions:  Your physician recommends that you continue on your current medications as directed. Please refer to the Current Medication list given to you today.  *If you need a refill on your cardiac medications before your next appointment, please call your pharmacy*  Lab Work: CBC, BMET (one week prior to procedure) If you have labs (blood work) drawn today and your tests are completely normal, you will receive your results only by: MyChart Message (if you have MyChart) OR A paper copy in the mail If you have any lab test that is abnormal or we need to change your treatment, we will call you to review the results.  Testing/Procedures: R & L Heart Catherization Your physician has requested that you have a cardiac catheterization. Cardiac catheterization is used to diagnose and/or treat various heart conditions. Doctors may recommend this procedure for a number of different reasons. The most common reason is to evaluate chest pain. Chest pain can be a symptom of coronary artery disease (CAD), and cardiac catheterization can show whether plaque is narrowing or blocking your heart's arteries. This procedure is also used to evaluate the valves, as well as measure the blood flow and oxygen levels in different parts of your heart. For further information please visit [email or web address] Please  follow instruction sheet, as given.  TAVR CT's (you will be called to schedule)  Follow-Up: At [facility], you and your health needs are our priority.  As part of our  continuing mission to provide you with exceptional heart care, we have created designated Provider Care Teams.  These Care Teams include your primary Cardiologist (physician) and Advanced Practice Providers (APPs -  Physician Assistants and Nurse Practitioners) who all work together to provide you with the care you need, when you need it.  Your next appointment:   Structural Team will follow-up  Provider:   Tonny Bollman, MD    Other Instructions       Cardiac/Peripheral Catheterization   You are scheduled for a Cardiac Catheterization on Friday, September 27 with Dr. Tonny Bollman.  1. Please arrive at the [facility] (Main Entrance A) at [facility]: [address] [place], [place] [address] at 8:30 AM (This time is two hour(s) before your procedure to ensure your preparation). Free valet parking service is available. You will check in at ADMITTING. The support person will be asked to wait in the waiting room.  It is OK to have someone drop you off and come back when you are ready to be discharged.        Special note: Every effort is made to have your procedure done on time. Please understand that emergencies sometimes delay scheduled procedures.  2. Diet: Do not eat solid foods after midnight.  You may have clear liquids until 5 AM the day of the procedure.  3. Labs: You will need to have blood drawn seven (7) days prior to procedure. You do not need to be fasting.  4. Medication instructions in preparation for your procedure:   Contrast Allergy: No  On the morning of your procedure, take Aspirin 81 mg and any morning medicines NOT listed above.  You may use sips of water.  5. Plan to go home the same day, you will only stay overnight if medically necessary. 6. You MUST have a responsible  adult to drive you home. 7. An adult MUST be with you the first 24 hours after you arrive home. 8. Bring a current list of your medications, and the last time and date medication taken. 9. Bring ID and current insurance cards. 10.Please wear clothes that are easy to get on and off and wear slip-on shoes.  Thank you for allowing [place] to care for you!   -- [facility] Invasive Cardiovascular services    Signed, Tonny Bollman, MD  09/20/2022 5:35 PM    [facility]

## 2022-09-21 ENCOUNTER — Other Ambulatory Visit: Payer: Self-pay | Admitting: Cardiology

## 2022-09-21 ENCOUNTER — Telehealth: Payer: Self-pay

## 2022-09-21 DIAGNOSIS — Z992 Dependence on renal dialysis: Secondary | ICD-10-CM | POA: Diagnosis not present

## 2022-09-21 DIAGNOSIS — N186 End stage renal disease: Secondary | ICD-10-CM | POA: Diagnosis not present

## 2022-09-21 DIAGNOSIS — N2581 Secondary hyperparathyroidism of renal origin: Secondary | ICD-10-CM | POA: Diagnosis not present

## 2022-09-21 DIAGNOSIS — I350 Nonrheumatic aortic (valve) stenosis: Secondary | ICD-10-CM

## 2022-09-21 NOTE — Telephone Encounter (Signed)
Called to arrange TAVR CTs and TCTS appointment. He is scheduled for [facility] 9/27.  The patient has dialysis T/Th/S.  Left message to call back.  CT orders placed for scheduling when the patient is reached.

## 2022-09-21 NOTE — Telephone Encounter (Signed)
Justin Chard, NP, spoke with the patient and scheduled him for TAVR scans 10/4 and TCTS consult 10/14.

## 2022-09-22 ENCOUNTER — Encounter: Payer: Self-pay | Admitting: Cardiology

## 2022-09-23 DIAGNOSIS — Z992 Dependence on renal dialysis: Secondary | ICD-10-CM | POA: Diagnosis not present

## 2022-09-23 DIAGNOSIS — N186 End stage renal disease: Secondary | ICD-10-CM | POA: Diagnosis not present

## 2022-09-23 DIAGNOSIS — N2581 Secondary hyperparathyroidism of renal origin: Secondary | ICD-10-CM | POA: Diagnosis not present

## 2022-09-25 DIAGNOSIS — Z01810 Encounter for preprocedural cardiovascular examination: Secondary | ICD-10-CM | POA: Diagnosis not present

## 2022-09-25 LAB — CBC
Hematocrit: 32.8 % — ABNORMAL LOW (ref 37.5–51.0)
Hemoglobin: 10.8 g/dL — ABNORMAL LOW (ref 13.0–17.7)
MCH: 31.2 pg (ref 26.6–33.0)
MCHC: 32.9 g/dL (ref 31.5–35.7)
MCV: 95 fL (ref 79–97)
Platelets: 155 10*3/uL (ref 150–450)
RBC: 3.46 x10E6/uL — ABNORMAL LOW (ref 4.14–5.80)
RDW: 13.8 % (ref 11.6–15.4)
WBC: 6.6 10*3/uL (ref 3.4–10.8)

## 2022-09-26 DIAGNOSIS — N2581 Secondary hyperparathyroidism of renal origin: Secondary | ICD-10-CM | POA: Diagnosis not present

## 2022-09-26 DIAGNOSIS — N186 End stage renal disease: Secondary | ICD-10-CM | POA: Diagnosis not present

## 2022-09-26 DIAGNOSIS — Z992 Dependence on renal dialysis: Secondary | ICD-10-CM | POA: Diagnosis not present

## 2022-09-26 LAB — BASIC METABOLIC PANEL
BUN/Creatinine Ratio: 5 — ABNORMAL LOW (ref 10–24)
BUN: 40 mg/dL — ABNORMAL HIGH (ref 8–27)
CO2: 25 mmol/L (ref 20–29)
Calcium: 9.2 mg/dL (ref 8.6–10.2)
Chloride: 90 mmol/L — ABNORMAL LOW (ref 96–106)
Creatinine, Ser: 7.94 mg/dL — ABNORMAL HIGH (ref 0.76–1.27)
Glucose: 302 mg/dL — ABNORMAL HIGH (ref 70–99)
Potassium: 4.7 mmol/L (ref 3.5–5.2)
Sodium: 135 mmol/L (ref 134–144)
eGFR: 7 mL/min/{1.73_m2} — ABNORMAL LOW (ref >59)

## 2022-09-27 ENCOUNTER — Telehealth: Payer: Self-pay | Admitting: *Deleted

## 2022-09-27 NOTE — Telephone Encounter (Signed)
Cardiac Catheterization scheduled at [facility] for: Friday September 29, 2022 10:30 AM Arrival time [facility] Main Entrance A at: 8:30 AM  Nothing to eat after midnight prior to procedure, clear liquids until 5 AM day of procedure.  Medication instructions: -Hold:  Insulin-1/2 usual dose HS prior to procedure -Other usual morning medications can be taken with sips of water including aspirin 81 mg.  Plan to go home the same day, you will only stay overnight if medically necessary.  You must have responsible adult to drive you home.  Someone must be with you the first 24 hours after you arrive home.  Reviewed procedure instructions with patient. Confirmed with patient dialysis schedule is Tu-Th-Sat.

## 2022-09-28 DIAGNOSIS — N186 End stage renal disease: Secondary | ICD-10-CM | POA: Diagnosis not present

## 2022-09-28 DIAGNOSIS — N2581 Secondary hyperparathyroidism of renal origin: Secondary | ICD-10-CM | POA: Diagnosis not present

## 2022-09-28 DIAGNOSIS — Z992 Dependence on renal dialysis: Secondary | ICD-10-CM | POA: Diagnosis not present

## 2022-09-29 ENCOUNTER — Encounter (HOSPITAL_COMMUNITY)
Admission: RE | Disposition: A | Discharge status: Home / Self Care | Payer: Self-pay | Source: Home / Self Care | Attending: Cardiovascular Disease

## 2022-09-29 ENCOUNTER — Ambulatory Visit (HOSPITAL_COMMUNITY)
Admission: RE | Admit: 2022-09-29 | Discharge: 2022-09-29 | Disposition: A | Discharge status: Home / Self Care | Payer: Medicare HMO | Attending: Cardiovascular Disease | Admitting: Cardiovascular Disease

## 2022-09-29 ENCOUNTER — Other Ambulatory Visit: Payer: Self-pay

## 2022-09-29 DIAGNOSIS — Z794 Long term (current) use of insulin: Secondary | ICD-10-CM | POA: Insufficient documentation

## 2022-09-29 DIAGNOSIS — I2582 Chronic total occlusion of coronary artery: Secondary | ICD-10-CM | POA: Insufficient documentation

## 2022-09-29 DIAGNOSIS — I120 Hypertensive chronic kidney disease with stage 5 chronic kidney disease or end stage renal disease: Secondary | ICD-10-CM | POA: Diagnosis not present

## 2022-09-29 DIAGNOSIS — I350 Nonrheumatic aortic (valve) stenosis: Secondary | ICD-10-CM | POA: Insufficient documentation

## 2022-09-29 DIAGNOSIS — E1122 Type 2 diabetes mellitus with diabetic chronic kidney disease: Secondary | ICD-10-CM | POA: Insufficient documentation

## 2022-09-29 DIAGNOSIS — Z992 Dependence on renal dialysis: Secondary | ICD-10-CM | POA: Diagnosis not present

## 2022-09-29 DIAGNOSIS — N186 End stage renal disease: Secondary | ICD-10-CM | POA: Insufficient documentation

## 2022-09-29 DIAGNOSIS — I2510 Atherosclerotic heart disease of native coronary artery without angina pectoris: Secondary | ICD-10-CM | POA: Diagnosis not present

## 2022-09-29 DIAGNOSIS — I2584 Coronary atherosclerosis due to calcified coronary lesion: Secondary | ICD-10-CM | POA: Diagnosis not present

## 2022-09-29 HISTORY — PX: RIGHT/LEFT HEART CATH AND CORONARY ANGIOGRAPHY: CATH118266

## 2022-09-29 LAB — POCT I-STAT 7, (LYTES, BLD GAS, ICA,H+H)
Acid-Base Excess: 4 mmol/L — ABNORMAL HIGH (ref 0.0–2.0)
Bicarbonate: 29.2 mmol/L — ABNORMAL HIGH (ref 20.0–28.0)
Calcium, Ion: 1.14 mmol/L — ABNORMAL LOW (ref 1.15–1.40)
HCT: 27 % — ABNORMAL LOW (ref 39.0–52.0)
Hemoglobin: 9.2 g/dL — ABNORMAL LOW (ref 13.0–17.0)
O2 Saturation: 90 %
Potassium: 4.3 mmol/L (ref 3.5–5.1)
Sodium: 133 mmol/L — ABNORMAL LOW (ref 135–145)
TCO2: 31 mmol/L (ref 22–32)
pCO2 arterial: 48.3 mm[Hg] — ABNORMAL HIGH (ref 32–48)
pH, Arterial: 7.389 (ref 7.35–7.45)
pO2, Arterial: 61 mm[Hg] — ABNORMAL LOW (ref 83–108)

## 2022-09-29 LAB — POCT I-STAT EG7
Acid-Base Excess: 4 mmol/L — ABNORMAL HIGH (ref 0.0–2.0)
Bicarbonate: 29.4 mmol/L — ABNORMAL HIGH (ref 20.0–28.0)
Calcium, Ion: 1.13 mmol/L — ABNORMAL LOW (ref 1.15–1.40)
HCT: 28 % — ABNORMAL LOW (ref 39.0–52.0)
Hemoglobin: 9.5 g/dL — ABNORMAL LOW (ref 13.0–17.0)
O2 Saturation: 70 %
Potassium: 4.3 mmol/L (ref 3.5–5.1)
Sodium: 133 mmol/L — ABNORMAL LOW (ref 135–145)
TCO2: 31 mmol/L (ref 22–32)
pCO2, Ven: 47.2 mm[Hg] (ref 44–60)
pH, Ven: 7.402 (ref 7.25–7.43)
pO2, Ven: 37 mm[Hg] (ref 32–45)

## 2022-09-29 LAB — GLUCOSE, CAPILLARY
Glucose-Capillary: 326 mg/dL — ABNORMAL HIGH (ref 70–99)
Glucose-Capillary: 310 mg/dL — ABNORMAL HIGH (ref 70–99)

## 2022-09-29 SURGERY — RIGHT/LEFT HEART CATH AND CORONARY ANGIOGRAPHY
Anesthesia: LOCAL

## 2022-09-29 MED ORDER — LIDOCAINE HCL (PF) 1 % IJ SOLN
INTRAMUSCULAR | Status: DC | PRN
  Administered 2022-09-29: 8 mL

## 2022-09-29 MED ORDER — IOHEXOL 350 MG/ML SOLN
INTRAVENOUS | Status: DC | PRN
  Administered 2022-09-29: 50 mL

## 2022-09-29 MED ORDER — LIDOCAINE HCL (PF) 1 % IJ SOLN
INTRAMUSCULAR | Status: AC
  Filled 2022-09-29: qty 30

## 2022-09-29 MED ORDER — ACETAMINOPHEN 325 MG PO TABS: 650.0000 mg | ORAL_TABLET | ORAL | Status: DC | PRN

## 2022-09-29 MED ORDER — LABETALOL HCL 5 MG/ML IV SOLN: 10.0000 mg | INTRAVENOUS | Status: DC | PRN

## 2022-09-29 MED ORDER — FENTANYL CITRATE (PF) 100 MCG/2ML IJ SOLN
INTRAMUSCULAR | Status: AC
  Filled 2022-09-29: qty 2

## 2022-09-29 MED ORDER — MIDAZOLAM HCL 2 MG/2ML IJ SOLN
INTRAMUSCULAR | Status: DC | PRN
  Administered 2022-09-29: 1 mg via INTRAVENOUS

## 2022-09-29 MED ORDER — DIAZEPAM 2 MG PO TABS: 2.0000 mg | ORAL_TABLET | Freq: Four times a day (QID) | ORAL | Status: DC | PRN

## 2022-09-29 MED ORDER — SODIUM CHLORIDE 0.9 % IV SOLN: INTRAVENOUS | Status: DC

## 2022-09-29 MED ORDER — ONDANSETRON HCL 4 MG/2ML IJ SOLN: 4.0000 mg | Freq: Four times a day (QID) | INTRAMUSCULAR | Status: DC | PRN

## 2022-09-29 MED ORDER — OXYCODONE HCL 5 MG PO TABS: 5.0000 mg | ORAL_TABLET | ORAL | Status: DC | PRN

## 2022-09-29 MED ORDER — HEPARIN SODIUM (PORCINE) 1000 UNIT/ML IJ SOLN
INTRAMUSCULAR | Status: AC
  Filled 2022-09-29: qty 10

## 2022-09-29 MED ORDER — MIDAZOLAM HCL 2 MG/2ML IJ SOLN
INTRAMUSCULAR | Status: AC
  Filled 2022-09-29: qty 2

## 2022-09-29 MED ORDER — FENTANYL CITRATE (PF) 100 MCG/2ML IJ SOLN
INTRAMUSCULAR | Status: DC | PRN
  Administered 2022-09-29: 25 ug via INTRAVENOUS

## 2022-09-29 MED ORDER — VERAPAMIL HCL 2.5 MG/ML IV SOLN
INTRAVENOUS | Status: AC
  Filled 2022-09-29: qty 2

## 2022-09-29 MED ORDER — ASPIRIN 81 MG PO CHEW: 81.0000 mg | CHEWABLE_TABLET | ORAL | Status: DC

## 2022-09-29 MED ORDER — HEPARIN (PORCINE) IN NACL 1000-0.9 UT/500ML-% IV SOLN
INTRAVENOUS | Status: DC | PRN
  Administered 2022-09-29 (×2): 500 mL

## 2022-09-29 MED ORDER — HYDRALAZINE HCL 20 MG/ML IJ SOLN: 10.0000 mg | INTRAMUSCULAR | Status: DC | PRN

## 2022-09-29 MED ORDER — SODIUM CHLORIDE 0.9% FLUSH: 3.0000 mL | Freq: Two times a day (BID) | INTRAVENOUS | Status: DC

## 2022-09-29 MED ORDER — SODIUM CHLORIDE 0.9% FLUSH: 3.0000 mL | INTRAVENOUS | Status: DC | PRN

## 2022-09-29 MED ORDER — SODIUM CHLORIDE 0.9 % IV SOLN: 250.0000 mL | INTRAVENOUS | Status: DC | PRN

## 2022-09-29 SURGICAL SUPPLY — 12 items
CATH INFINITI 5FR MULTPACK ANG (CATHETERS) IMPLANT
CATH SWAN GANZ VIP 7.5F (CATHETERS) IMPLANT
CLOSURE MYNX CONTROL 5F (Vascular Products) IMPLANT
GUIDEWIRE .025 260CM (WIRE) IMPLANT
KIT MICROPUNCTURE NIT STIFF (SHEATH) IMPLANT
PACK CARDIAC CATHETERIZATION (CUSTOM PROCEDURE TRAY) ×1 IMPLANT
SET ATX-X65L (MISCELLANEOUS) IMPLANT
SHEATH PINNACLE 5F 10CM (SHEATH) IMPLANT
SHEATH PINNACLE 7F 10CM (SHEATH) IMPLANT
SHEATH PINNACLE 8F 10CM (SHEATH) IMPLANT
SHEATH PROBE COVER 6X72 (BAG) IMPLANT
WIRE EMERALD 3MM-J .035X150CM (WIRE) IMPLANT

## 2022-09-29 NOTE — Discharge Instructions (Signed)
Femoral Site Care This sheet gives you information about how to care for yourself after your procedure. Your health care provider may also give you more specific instructions. If you have problems or questions, contact your health care provider. What can I expect after the procedure?  After the procedure, it is common to have: Bruising that usually fades within 1-2 weeks. Tenderness at the site. Follow these instructions at home: Wound care Follow instructions from your health care provider about how to take care of your insertion site. Make sure you: Wash your hands with soap and water before you change your bandage (dressing). If soap and water are not available, use hand sanitizer. Remove your dressing as told by your health care provider. In 24 hours Do not take baths, swim, or use a hot tub until your health care provider approves. You may shower 24-48 hours after the procedure or as told by your health care provider. Gently wash the site with plain soap and water. Pat the area dry with a clean towel. Do not rub the site. This may cause bleeding. Do not apply powder or lotion to the site. Keep the site clean and dry. Check your femoral site every day for signs of infection. Check for: Redness, swelling, or pain. Fluid or blood. Warmth. Pus or a bad smell. Activity For the first 2-3 days after your procedure, or as long as directed: Avoid climbing stairs as much as possible. Do not squat. Do not lift anything that is heavier than 10 lb (4.5 kg), or the limit that you are told, until your health care provider says that it is safe. For 5 days Rest as directed. Avoid sitting for a long time without moving. Get up to take short walks every 1-2 hours. Do not drive for 24 hours if you were given a medicine to help you relax (sedative). General instructions Take over-the-counter and prescription medicines only as told by your health care provider. Keep all follow-up visits as told by  your health care provider. This is important. Contact a health care provider if you have: A fever or chills. You have redness, swelling, or pain around your insertion site. Get help right away if: The catheter insertion area swells very fast. You pass out. You suddenly start to sweat or your skin gets clammy. The catheter insertion area is bleeding, and the bleeding does not stop when you hold steady pressure on the area. The area near or just beyond the catheter insertion site becomes pale, cool, tingly, or numb. These symptoms may represent a serious problem that is an emergency. Do not wait to see if the symptoms will go away. Get medical help right away. Call your local emergency services (911 in the [place] Do not drive yourself to the hospital. Summary After the procedure, it is common to have bruising that usually fades within 1-2 weeks. Check your femoral site every day for signs of infection. Do not lift anything that is heavier than 10 lb (4.5 kg), or the limit that you are told, until your health care provider says that it is safe. This information is not intended to replace advice given to you by your health care provider. Make sure you discuss any questions you have with your health care provider. Document Revised: 01/01/2017 Document Reviewed: 01/01/2017 Elsevier Patient Education  2020 Elsevier Inc[employer] 

## 2022-09-29 NOTE — Interval H&P Note (Signed)
History and Physical Interval Note:  09/29/2022 9:59 AM  Justin [name]  has presented today for surgery, with the diagnosis of aortic stenosis.  The various methods of treatment have been discussed with the patient and family. After consideration of risks, benefits and other options for treatment, the patient has consented to  Procedure(s): RIGHT/LEFT HEART CATH AND CORONARY ANGIOGRAPHY (N/A) as a surgical intervention.  The patient's history has been reviewed, patient examined, no change in status, stable for surgery.  I have reviewed the patient's chart and labs.  Questions were answered to the patient's satisfaction.     Tonny Bollman

## 2022-09-30 DIAGNOSIS — Z992 Dependence on renal dialysis: Secondary | ICD-10-CM | POA: Diagnosis not present

## 2022-09-30 DIAGNOSIS — N186 End stage renal disease: Secondary | ICD-10-CM | POA: Diagnosis not present

## 2022-09-30 DIAGNOSIS — N2581 Secondary hyperparathyroidism of renal origin: Secondary | ICD-10-CM | POA: Diagnosis not present

## 2022-10-02 ENCOUNTER — Telehealth: Payer: Self-pay | Admitting: Physician Assistant

## 2022-10-02 ENCOUNTER — Encounter (HOSPITAL_COMMUNITY): Payer: Self-pay | Admitting: Cardiovascular Disease

## 2022-10-02 DIAGNOSIS — Z992 Dependence on renal dialysis: Secondary | ICD-10-CM | POA: Diagnosis not present

## 2022-10-02 DIAGNOSIS — N186 End stage renal disease: Secondary | ICD-10-CM | POA: Diagnosis not present

## 2022-10-02 DIAGNOSIS — E1129 Type 2 diabetes mellitus with other diabetic kidney complication: Secondary | ICD-10-CM | POA: Diagnosis not present

## 2022-10-02 MED FILL — Heparin Sodium (Porcine) Inj 1000 Unit/ML: INTRAMUSCULAR | Qty: 10 | Status: AC

## 2022-10-02 NOTE — Telephone Encounter (Signed)
From: Tonny Bollman, MD  Sent: 09/29/2022  11:22 AM EDT  To: Justin Lea, MD; *   Justin [name] - as you know they are trying to get him on the kidney transplant list. Based on his cath today, I don't see anything that would place him in a high risk category for surgery. With that said, I don't know all of the things they look at that might exclude someone. From my perspective, I would just follow him with an annual echo and I suspect he will progress in the next few years. I'm happy to see him back when his AS progresses just let me know how I can help. Nice people.   Thx - Mike   P.s. Florentina Addison can you cancel the CTA studies? I don't think they are necessary at this point.   _____________________________  TAVR work up cancelled and pt sent back to Dr. Bing Matter for follow up. He will re consult [place] when his AS progresses to severe.   Structural will sign off at this time.    Cline Crock PA-C  [facility]

## 2022-10-03 DIAGNOSIS — N186 End stage renal disease: Secondary | ICD-10-CM | POA: Diagnosis not present

## 2022-10-03 DIAGNOSIS — Z992 Dependence on renal dialysis: Secondary | ICD-10-CM | POA: Diagnosis not present

## 2022-10-03 DIAGNOSIS — N2581 Secondary hyperparathyroidism of renal origin: Secondary | ICD-10-CM | POA: Diagnosis not present

## 2022-10-05 DIAGNOSIS — Z992 Dependence on renal dialysis: Secondary | ICD-10-CM | POA: Diagnosis not present

## 2022-10-05 DIAGNOSIS — N2581 Secondary hyperparathyroidism of renal origin: Secondary | ICD-10-CM | POA: Diagnosis not present

## 2022-10-05 DIAGNOSIS — N186 End stage renal disease: Secondary | ICD-10-CM | POA: Diagnosis not present

## 2022-10-06 ENCOUNTER — Ambulatory Visit (HOSPITAL_COMMUNITY): Payer: Medicare HMO

## 2022-10-07 DIAGNOSIS — N2581 Secondary hyperparathyroidism of renal origin: Secondary | ICD-10-CM | POA: Diagnosis not present

## 2022-10-07 DIAGNOSIS — Z992 Dependence on renal dialysis: Secondary | ICD-10-CM | POA: Diagnosis not present

## 2022-10-07 DIAGNOSIS — N186 End stage renal disease: Secondary | ICD-10-CM | POA: Diagnosis not present

## 2022-10-10 DIAGNOSIS — Z992 Dependence on renal dialysis: Secondary | ICD-10-CM | POA: Diagnosis not present

## 2022-10-10 DIAGNOSIS — N186 End stage renal disease: Secondary | ICD-10-CM | POA: Diagnosis not present

## 2022-10-10 DIAGNOSIS — N2581 Secondary hyperparathyroidism of renal origin: Secondary | ICD-10-CM | POA: Diagnosis not present

## 2022-10-12 DIAGNOSIS — N186 End stage renal disease: Secondary | ICD-10-CM | POA: Diagnosis not present

## 2022-10-12 DIAGNOSIS — Z992 Dependence on renal dialysis: Secondary | ICD-10-CM | POA: Diagnosis not present

## 2022-10-12 DIAGNOSIS — N2581 Secondary hyperparathyroidism of renal origin: Secondary | ICD-10-CM | POA: Diagnosis not present

## 2022-10-14 DIAGNOSIS — N186 End stage renal disease: Secondary | ICD-10-CM | POA: Diagnosis not present

## 2022-10-14 DIAGNOSIS — Z992 Dependence on renal dialysis: Secondary | ICD-10-CM | POA: Diagnosis not present

## 2022-10-14 DIAGNOSIS — N2581 Secondary hyperparathyroidism of renal origin: Secondary | ICD-10-CM | POA: Diagnosis not present

## 2022-10-16 ENCOUNTER — Encounter: Payer: Medicare HMO | Admitting: Thoracic Surgery (Cardiothoracic Vascular Surgery)

## 2022-10-17 DIAGNOSIS — Z992 Dependence on renal dialysis: Secondary | ICD-10-CM | POA: Diagnosis not present

## 2022-10-17 DIAGNOSIS — N186 End stage renal disease: Secondary | ICD-10-CM | POA: Diagnosis not present

## 2022-10-17 DIAGNOSIS — N2581 Secondary hyperparathyroidism of renal origin: Secondary | ICD-10-CM | POA: Diagnosis not present

## 2022-10-19 DIAGNOSIS — N186 End stage renal disease: Secondary | ICD-10-CM | POA: Diagnosis not present

## 2022-10-19 DIAGNOSIS — N2581 Secondary hyperparathyroidism of renal origin: Secondary | ICD-10-CM | POA: Diagnosis not present

## 2022-10-19 DIAGNOSIS — Z992 Dependence on renal dialysis: Secondary | ICD-10-CM | POA: Diagnosis not present

## 2022-10-20 ENCOUNTER — Other Ambulatory Visit (HOSPITAL_BASED_OUTPATIENT_CLINIC_OR_DEPARTMENT_OTHER): Payer: Self-pay | Admitting: Cardiology

## 2022-10-20 DIAGNOSIS — I350 Nonrheumatic aortic (valve) stenosis: Secondary | ICD-10-CM

## 2022-10-20 DIAGNOSIS — I25118 Atherosclerotic heart disease of native coronary artery with other forms of angina pectoris: Secondary | ICD-10-CM

## 2022-10-20 DIAGNOSIS — I10 Essential (primary) hypertension: Secondary | ICD-10-CM

## 2022-10-21 DIAGNOSIS — Z992 Dependence on renal dialysis: Secondary | ICD-10-CM | POA: Diagnosis not present

## 2022-10-21 DIAGNOSIS — N2581 Secondary hyperparathyroidism of renal origin: Secondary | ICD-10-CM | POA: Diagnosis not present

## 2022-10-21 DIAGNOSIS — N186 End stage renal disease: Secondary | ICD-10-CM | POA: Diagnosis not present

## 2022-10-24 DIAGNOSIS — Z992 Dependence on renal dialysis: Secondary | ICD-10-CM | POA: Diagnosis not present

## 2022-10-24 DIAGNOSIS — N186 End stage renal disease: Secondary | ICD-10-CM | POA: Diagnosis not present

## 2022-10-24 DIAGNOSIS — N2581 Secondary hyperparathyroidism of renal origin: Secondary | ICD-10-CM | POA: Diagnosis not present

## 2022-10-26 DIAGNOSIS — N2581 Secondary hyperparathyroidism of renal origin: Secondary | ICD-10-CM | POA: Diagnosis not present

## 2022-10-26 DIAGNOSIS — N186 End stage renal disease: Secondary | ICD-10-CM | POA: Diagnosis not present

## 2022-10-26 DIAGNOSIS — Z992 Dependence on renal dialysis: Secondary | ICD-10-CM | POA: Diagnosis not present

## 2022-10-28 DIAGNOSIS — N186 End stage renal disease: Secondary | ICD-10-CM | POA: Diagnosis not present

## 2022-10-28 DIAGNOSIS — N2581 Secondary hyperparathyroidism of renal origin: Secondary | ICD-10-CM | POA: Diagnosis not present

## 2022-10-28 DIAGNOSIS — Z992 Dependence on renal dialysis: Secondary | ICD-10-CM | POA: Diagnosis not present

## 2022-10-31 DIAGNOSIS — N2581 Secondary hyperparathyroidism of renal origin: Secondary | ICD-10-CM | POA: Diagnosis not present

## 2022-10-31 DIAGNOSIS — Z992 Dependence on renal dialysis: Secondary | ICD-10-CM | POA: Diagnosis not present

## 2022-10-31 DIAGNOSIS — N186 End stage renal disease: Secondary | ICD-10-CM | POA: Diagnosis not present

## 2022-11-02 DIAGNOSIS — Z992 Dependence on renal dialysis: Secondary | ICD-10-CM | POA: Diagnosis not present

## 2022-11-02 DIAGNOSIS — N186 End stage renal disease: Secondary | ICD-10-CM | POA: Diagnosis not present

## 2022-11-02 DIAGNOSIS — E1129 Type 2 diabetes mellitus with other diabetic kidney complication: Secondary | ICD-10-CM | POA: Diagnosis not present

## 2022-11-02 DIAGNOSIS — N2581 Secondary hyperparathyroidism of renal origin: Secondary | ICD-10-CM | POA: Diagnosis not present

## 2022-11-04 DIAGNOSIS — N186 End stage renal disease: Secondary | ICD-10-CM | POA: Diagnosis not present

## 2022-11-04 DIAGNOSIS — Z992 Dependence on renal dialysis: Secondary | ICD-10-CM | POA: Diagnosis not present

## 2022-11-04 DIAGNOSIS — N2581 Secondary hyperparathyroidism of renal origin: Secondary | ICD-10-CM | POA: Diagnosis not present

## 2022-11-07 DIAGNOSIS — N186 End stage renal disease: Secondary | ICD-10-CM | POA: Diagnosis not present

## 2022-11-07 DIAGNOSIS — Z992 Dependence on renal dialysis: Secondary | ICD-10-CM | POA: Diagnosis not present

## 2022-11-07 DIAGNOSIS — N2581 Secondary hyperparathyroidism of renal origin: Secondary | ICD-10-CM | POA: Diagnosis not present

## 2022-11-11 DIAGNOSIS — Z992 Dependence on renal dialysis: Secondary | ICD-10-CM | POA: Diagnosis not present

## 2022-11-11 DIAGNOSIS — N186 End stage renal disease: Secondary | ICD-10-CM | POA: Diagnosis not present

## 2022-11-11 DIAGNOSIS — N2581 Secondary hyperparathyroidism of renal origin: Secondary | ICD-10-CM | POA: Diagnosis not present

## 2022-11-12 ENCOUNTER — Other Ambulatory Visit: Payer: Self-pay | Admitting: Cardiology

## 2022-11-14 DIAGNOSIS — N186 End stage renal disease: Secondary | ICD-10-CM | POA: Diagnosis not present

## 2022-11-14 DIAGNOSIS — N2581 Secondary hyperparathyroidism of renal origin: Secondary | ICD-10-CM | POA: Diagnosis not present

## 2022-11-14 DIAGNOSIS — Z992 Dependence on renal dialysis: Secondary | ICD-10-CM | POA: Diagnosis not present

## 2022-11-16 DIAGNOSIS — N2581 Secondary hyperparathyroidism of renal origin: Secondary | ICD-10-CM | POA: Diagnosis not present

## 2022-11-16 DIAGNOSIS — Z992 Dependence on renal dialysis: Secondary | ICD-10-CM | POA: Diagnosis not present

## 2022-11-16 DIAGNOSIS — N186 End stage renal disease: Secondary | ICD-10-CM | POA: Diagnosis not present

## 2022-11-18 DIAGNOSIS — N186 End stage renal disease: Secondary | ICD-10-CM | POA: Diagnosis not present

## 2022-11-18 DIAGNOSIS — N2581 Secondary hyperparathyroidism of renal origin: Secondary | ICD-10-CM | POA: Diagnosis not present

## 2022-11-18 DIAGNOSIS — Z992 Dependence on renal dialysis: Secondary | ICD-10-CM | POA: Diagnosis not present

## 2022-11-21 DIAGNOSIS — N186 End stage renal disease: Secondary | ICD-10-CM | POA: Diagnosis not present

## 2022-11-21 DIAGNOSIS — Z992 Dependence on renal dialysis: Secondary | ICD-10-CM | POA: Diagnosis not present

## 2022-11-21 DIAGNOSIS — N2581 Secondary hyperparathyroidism of renal origin: Secondary | ICD-10-CM | POA: Diagnosis not present

## 2022-11-23 DIAGNOSIS — N186 End stage renal disease: Secondary | ICD-10-CM | POA: Diagnosis not present

## 2022-11-23 DIAGNOSIS — N2581 Secondary hyperparathyroidism of renal origin: Secondary | ICD-10-CM | POA: Diagnosis not present

## 2022-11-23 DIAGNOSIS — Z992 Dependence on renal dialysis: Secondary | ICD-10-CM | POA: Diagnosis not present

## 2022-11-25 DIAGNOSIS — N2581 Secondary hyperparathyroidism of renal origin: Secondary | ICD-10-CM | POA: Diagnosis not present

## 2022-11-25 DIAGNOSIS — Z992 Dependence on renal dialysis: Secondary | ICD-10-CM | POA: Diagnosis not present

## 2022-11-25 DIAGNOSIS — N186 End stage renal disease: Secondary | ICD-10-CM | POA: Diagnosis not present

## 2022-11-28 DIAGNOSIS — N2581 Secondary hyperparathyroidism of renal origin: Secondary | ICD-10-CM | POA: Diagnosis not present

## 2022-11-28 DIAGNOSIS — N186 End stage renal disease: Secondary | ICD-10-CM | POA: Diagnosis not present

## 2022-11-28 DIAGNOSIS — Z992 Dependence on renal dialysis: Secondary | ICD-10-CM | POA: Diagnosis not present

## 2022-12-02 DIAGNOSIS — Z992 Dependence on renal dialysis: Secondary | ICD-10-CM | POA: Diagnosis not present

## 2022-12-02 DIAGNOSIS — E1129 Type 2 diabetes mellitus with other diabetic kidney complication: Secondary | ICD-10-CM | POA: Diagnosis not present

## 2022-12-02 DIAGNOSIS — N186 End stage renal disease: Secondary | ICD-10-CM | POA: Diagnosis not present

## 2022-12-02 DIAGNOSIS — N2581 Secondary hyperparathyroidism of renal origin: Secondary | ICD-10-CM | POA: Diagnosis not present

## 2022-12-05 DIAGNOSIS — N2581 Secondary hyperparathyroidism of renal origin: Secondary | ICD-10-CM | POA: Diagnosis not present

## 2022-12-05 DIAGNOSIS — N186 End stage renal disease: Secondary | ICD-10-CM | POA: Diagnosis not present

## 2022-12-05 DIAGNOSIS — Z992 Dependence on renal dialysis: Secondary | ICD-10-CM | POA: Diagnosis not present

## 2022-12-06 ENCOUNTER — Ambulatory Visit: Payer: Medicare HMO | Attending: Cardiology | Admitting: Cardiology

## 2022-12-06 ENCOUNTER — Encounter: Payer: Self-pay | Admitting: Cardiology

## 2022-12-06 VITALS — BP 150/60 | HR 72 | Ht 69.0 in | Wt 184.0 lb

## 2022-12-06 DIAGNOSIS — Z992 Dependence on renal dialysis: Secondary | ICD-10-CM | POA: Diagnosis not present

## 2022-12-06 DIAGNOSIS — I350 Nonrheumatic aortic (valve) stenosis: Secondary | ICD-10-CM | POA: Diagnosis not present

## 2022-12-06 DIAGNOSIS — I2510 Atherosclerotic heart disease of native coronary artery without angina pectoris: Secondary | ICD-10-CM | POA: Diagnosis not present

## 2022-12-06 DIAGNOSIS — N186 End stage renal disease: Secondary | ICD-10-CM

## 2022-12-06 DIAGNOSIS — I10 Essential (primary) hypertension: Secondary | ICD-10-CM | POA: Diagnosis not present

## 2022-12-06 NOTE — Patient Instructions (Signed)

## 2022-12-06 NOTE — Progress Notes (Signed)
Cardiology Office Note:    Date:  12/06/2022   ID:  WILBUR WESSELMANN, DOB [birth year], MRN [id number]  PCP:  Adrian Prince, MD  Cardiologist:  Gypsy Balsam, MD    Referring MD: Adrian Prince, MD   Chief Complaint  Patient presents with   Follow-up  Status post cardiac catheterization  History of Present Illness:    Justin [name] is a 72 y.o. male past medical history significant for end-stage kidney disease he is on dialysis he has been on dialysis for about 4 years, coronary artery disease cardiac catheterization done just recently show moderate stenosis of the LAD, also 70% of diagonal branch, 90% of small circumflex, 90% PDA.  The reason for cardiac catheterization was also to assess his degree of aortic stenosis which appears to be moderate, what prompted investigation to begin with was the fact that he was potential candidate for kidney transplant.  Overall clinically he is doing very well.  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 doing things with no reservations and no limitations denies have any shortness of breath chest pain tightness squeezing pressure burning chest  Past Medical History:  Diagnosis Date   BPH (benign prostatic hyperplasia)    Chronic kidney disease (CKD), stage IV (severe) (HCC)    Diabetes (HCC)    Dysuria    Erectile dysfunction    Hyperlipemia    Hypertension    Pinna disorder, left    Renal disorder    Wears glasses     Past Surgical History:  Procedure Laterality Date   AV FISTULA PLACEMENT Left 04/30/2018   Procedure: CREATION OF RADIOCEPHALIC ARTERIOVENOUS FISTULA LEFT ARM;  Surgeon: Sherren Kerns, MD;  Location: [facility] OR;  Service: Vascular;  Laterality: Left;   AV FISTULA PLACEMENT Left 08/28/2018   Procedure: ARTERIOVENOUS (AV) BRACHIOCEPHALIC FISTULA CREATION LEFT UPPER ARM;  Surgeon: Cephus Shelling, MD;  Location: [facility] OR;  Service: Vascular;  Laterality: Left;   IR FLUORO GUIDE CV LINE RIGHT  04/29/2018   IR US GUIDE VASC  ACCESS RIGHT  04/29/2018   RIGHT/LEFT HEART CATH AND CORONARY ANGIOGRAPHY N/A 05/01/2018   Procedure: RIGHT/LEFT HEART CATH AND CORONARY ANGIOGRAPHY;  Surgeon: Lyn Records, MD;  Location: [facility] INVASIVE CV LAB;  Service: Cardiovascular;  Laterality: N/A;   RIGHT/LEFT HEART CATH AND CORONARY ANGIOGRAPHY N/A 09/29/2022   Procedure: RIGHT/LEFT HEART CATH AND CORONARY ANGIOGRAPHY;  Surgeon: Tonny Bollman, MD;  Location: [facility] INVASIVE CV LAB;  Service: Cardiovascular;  Laterality: N/A;   subdural hematoma Right 07/2020    Current Medications: Current Meds  Medication Sig   amLODipine (NORVASC) 10 MG tablet Take 1 tablet by mouth once daily   aspirin EC 81 MG tablet Take 81 mg by mouth daily. Swallow whole.   cyanocobalamin (VITAMIN B12) 1000 MCG tablet Take 1,000 mcg by mouth daily.   ferric citrate (AURYXIA) 1 GM 210 MG(Fe) tablet Take 630 mg by mouth 3 (three) times daily with meals.   hydrALAZINE (APRESOLINE) 25 MG tablet Take 50 mg by mouth See admin instructions. Take 50 mg 3 times daily on Tues, Thurs, Sat. (dialysis days) Take 50 mg twice daily on Sun, Mon, Wed, and Fri (non-dialysis days)   isosorbide mononitrate (IMDUR) 30 MG 24 hr tablet Take 1 tablet (30 mg total) by mouth 4 (four) times a week. Mon, Wed, Fri, Sun (non-dialysis days)   metoprolol succinate (TOPROL-XL) 50 MG 24 hr tablet Take 50 mg by mouth daily.   nitroGLYCERIN (NITROSTAT) 0.4 MG  SL tablet Place 0.4 mg under the tongue every 5 (five) minutes as needed for chest pain.   NOVOLIN 70/30 RELION (70-30) 100 UNIT/ML injection Inject 20 Units into the skin daily.   rosuvastatin (CRESTOR) 40 MG tablet Take 40 mg by mouth daily.     Allergies:   Patient has no known allergies.   Social History   Socioeconomic History   Marital status: Divorced    Spouse name: Not on file   Number of children: 1   Years of education: Not on file   Highest education level: Not on file  Occupational History   Not on file  Tobacco Use    Smoking status: Never   Smokeless tobacco: Never  Vaping Use   Vaping status: Never Used  Substance and Sexual Activity   Alcohol use: Not Currently   Drug use: Never   Sexual activity: Not Currently  Other Topics Concern   Not on file  Social History Narrative   Not on file   Social Determinants of Health   Financial Resource Strain: Low Risk  (07/13/2020)   Received [facility]   Overall Financial Resource Strain (CARDIA)    Difficulty of Paying Living Expenses: Not very hard  Food Insecurity: Patient Declined (06/05/2022)   Hunger Vital Sign    Worried About Running Out of Food in the Last Year: Patient declined    Ran Out of Food in the Last Year: Patient declined  Transportation Needs: No Transportation Needs (08/03/2021)   PRAPARE - Administrator, Civil Service (Medical): No    Lack of Transportation (Non-Medical): No  Physical Activity: Not on file  Stress: Not on file  Social Connections: Not on file     Family History: The patient's family history includes Diabetes in his father; Stroke in his mother. ROS:   Please see the history of present illness.    All 14 point review of systems negative except as described per history of present illness  EKGs/Labs/Other Studies Reviewed:         Recent Labs: 06/05/2022: ALT 27; B Natriuretic Peptide 1,835.7 09/25/2022: BUN 40; Creatinine, Ser 7.94; Platelets 155 09/29/2022: Hemoglobin 9.2; Hemoglobin 9.5; Potassium 4.3; Potassium 4.3; Sodium 133; Sodium 133  Recent Lipid Panel    Component Value Date/Time   CHOL 109 04/25/2018 0429   TRIG 73 04/25/2018 0429   HDL 34 (L) 04/25/2018 0429   CHOLHDL 3.2 04/25/2018 0429   VLDL 15 04/25/2018 0429   LDLCALC 60 04/25/2018 0429   LDLDIRECT 32 06/05/2019 1639    Physical Exam:    VS:  BP (!) 150/60 (BP Location: Right Arm, Patient Position: Sitting, Cuff Size: Normal)   Pulse 72   Ht 5\' 9"  (1.753 m)   Wt 184 lb (83.5 kg)   SpO2 95%    BMI 27.17 kg/m     Wt Readings from Last 3 Encounters:  12/06/22 184 lb (83.5 kg)  09/29/22 180 lb (81.6 kg)  09/20/22 184 lb (83.5 kg)     GEN:  Well nourished, well developed in no acute distress HEENT: Normal NECK: No JVD; No carotid bruits LYMPHATICS: No lymphadenopathy CARDIAC: RRR, systolic ejection murmur grade 2/6 to 3/6  right upper portion of the sternum, no rubs, no gallops RESPIRATORY:  Clear to auscultation without rales, wheezing or rhonchi  ABDOMEN: Soft, non-tender, non-distended MUSCULOSKELETAL:  No edema; No deformity  SKIN: Warm and dry LOWER EXTREMITIES: no swelling NEUROLOGIC:  Alert and oriented  x 3 PSYCHIATRIC:  Normal affect   ASSESSMENT:    1. Coronary artery disease involving native coronary artery of native heart without angina pectoris   2. Nonrheumatic aortic valve stenosis   3. Primary hypertension   4. ESRD on hemodialysis [facility])    PLAN:    In order of problems listed above:  Aortic stenosis appears to be moderate we will continue monitoring 1 more time I explained to him signs and symptoms of severe arctic stenosis and ask him to let me know if he develop any of those. Coronary disease stable on guideline directed medical therapy which I will continue that include antiplatelets therapy as well as statin. Dyslipidemia I did review his K PN which show me his LDL of 21 HDL 20 disease from the beginning of this year we will make arrangements for cholesterol to be repeated. End-stage renal disease on dialysis, doing well.  Disqualified as candidate for transplant.   Medication Adjustments/Labs and Tests Ordered: Current medicines are reviewed at length with the patient today.  Concerns regarding medicines are outlined above.  No orders of the defined types were placed in this encounter.  Medication changes: No orders of the defined types were placed in this encounter.   Signed, Georgeanna Lea, MD, [facility] 12/06/2022 8:19 AM    [facility]

## 2022-12-07 DIAGNOSIS — Z992 Dependence on renal dialysis: Secondary | ICD-10-CM | POA: Diagnosis not present

## 2022-12-07 DIAGNOSIS — N186 End stage renal disease: Secondary | ICD-10-CM | POA: Diagnosis not present

## 2022-12-07 DIAGNOSIS — N2581 Secondary hyperparathyroidism of renal origin: Secondary | ICD-10-CM | POA: Diagnosis not present

## 2022-12-09 DIAGNOSIS — N186 End stage renal disease: Secondary | ICD-10-CM | POA: Diagnosis not present

## 2022-12-09 DIAGNOSIS — N2581 Secondary hyperparathyroidism of renal origin: Secondary | ICD-10-CM | POA: Diagnosis not present

## 2022-12-09 DIAGNOSIS — Z992 Dependence on renal dialysis: Secondary | ICD-10-CM | POA: Diagnosis not present

## 2022-12-12 DIAGNOSIS — N2581 Secondary hyperparathyroidism of renal origin: Secondary | ICD-10-CM | POA: Diagnosis not present

## 2022-12-12 DIAGNOSIS — Z992 Dependence on renal dialysis: Secondary | ICD-10-CM | POA: Diagnosis not present

## 2022-12-12 DIAGNOSIS — N186 End stage renal disease: Secondary | ICD-10-CM | POA: Diagnosis not present

## 2022-12-14 DIAGNOSIS — N186 End stage renal disease: Secondary | ICD-10-CM | POA: Diagnosis not present

## 2022-12-14 DIAGNOSIS — N2581 Secondary hyperparathyroidism of renal origin: Secondary | ICD-10-CM | POA: Diagnosis not present

## 2022-12-14 DIAGNOSIS — Z992 Dependence on renal dialysis: Secondary | ICD-10-CM | POA: Diagnosis not present

## 2022-12-16 DIAGNOSIS — N2581 Secondary hyperparathyroidism of renal origin: Secondary | ICD-10-CM | POA: Diagnosis not present

## 2022-12-16 DIAGNOSIS — N186 End stage renal disease: Secondary | ICD-10-CM | POA: Diagnosis not present

## 2022-12-16 DIAGNOSIS — Z992 Dependence on renal dialysis: Secondary | ICD-10-CM | POA: Diagnosis not present

## 2022-12-19 DIAGNOSIS — Z992 Dependence on renal dialysis: Secondary | ICD-10-CM | POA: Diagnosis not present

## 2022-12-19 DIAGNOSIS — N2581 Secondary hyperparathyroidism of renal origin: Secondary | ICD-10-CM | POA: Diagnosis not present

## 2022-12-19 DIAGNOSIS — N186 End stage renal disease: Secondary | ICD-10-CM | POA: Diagnosis not present

## 2022-12-21 DIAGNOSIS — N2581 Secondary hyperparathyroidism of renal origin: Secondary | ICD-10-CM | POA: Diagnosis not present

## 2022-12-21 DIAGNOSIS — N186 End stage renal disease: Secondary | ICD-10-CM | POA: Diagnosis not present

## 2022-12-21 DIAGNOSIS — Z992 Dependence on renal dialysis: Secondary | ICD-10-CM | POA: Diagnosis not present

## 2022-12-23 DIAGNOSIS — N2581 Secondary hyperparathyroidism of renal origin: Secondary | ICD-10-CM | POA: Diagnosis not present

## 2022-12-23 DIAGNOSIS — Z992 Dependence on renal dialysis: Secondary | ICD-10-CM | POA: Diagnosis not present

## 2022-12-23 DIAGNOSIS — N186 End stage renal disease: Secondary | ICD-10-CM | POA: Diagnosis not present

## 2022-12-25 DIAGNOSIS — N2581 Secondary hyperparathyroidism of renal origin: Secondary | ICD-10-CM | POA: Diagnosis not present

## 2022-12-25 DIAGNOSIS — N186 End stage renal disease: Secondary | ICD-10-CM | POA: Diagnosis not present

## 2022-12-25 DIAGNOSIS — Z992 Dependence on renal dialysis: Secondary | ICD-10-CM | POA: Diagnosis not present

## 2022-12-28 DIAGNOSIS — N186 End stage renal disease: Secondary | ICD-10-CM | POA: Diagnosis not present

## 2022-12-28 DIAGNOSIS — Z992 Dependence on renal dialysis: Secondary | ICD-10-CM | POA: Diagnosis not present

## 2022-12-28 DIAGNOSIS — N2581 Secondary hyperparathyroidism of renal origin: Secondary | ICD-10-CM | POA: Diagnosis not present

## 2022-12-30 DIAGNOSIS — N2581 Secondary hyperparathyroidism of renal origin: Secondary | ICD-10-CM | POA: Diagnosis not present

## 2022-12-30 DIAGNOSIS — Z992 Dependence on renal dialysis: Secondary | ICD-10-CM | POA: Diagnosis not present

## 2022-12-30 DIAGNOSIS — N186 End stage renal disease: Secondary | ICD-10-CM | POA: Diagnosis not present

## 2023-01-01 DIAGNOSIS — N2581 Secondary hyperparathyroidism of renal origin: Secondary | ICD-10-CM | POA: Diagnosis not present

## 2023-01-01 DIAGNOSIS — N186 End stage renal disease: Secondary | ICD-10-CM | POA: Diagnosis not present

## 2023-01-01 DIAGNOSIS — Z992 Dependence on renal dialysis: Secondary | ICD-10-CM | POA: Diagnosis not present

## 2023-01-02 DIAGNOSIS — Z992 Dependence on renal dialysis: Secondary | ICD-10-CM | POA: Diagnosis not present

## 2023-01-02 DIAGNOSIS — E1129 Type 2 diabetes mellitus with other diabetic kidney complication: Secondary | ICD-10-CM | POA: Diagnosis not present

## 2023-01-02 DIAGNOSIS — N186 End stage renal disease: Secondary | ICD-10-CM | POA: Diagnosis not present

## 2023-01-04 DIAGNOSIS — Z992 Dependence on renal dialysis: Secondary | ICD-10-CM | POA: Diagnosis not present

## 2023-01-04 DIAGNOSIS — N2581 Secondary hyperparathyroidism of renal origin: Secondary | ICD-10-CM | POA: Diagnosis not present

## 2023-01-04 DIAGNOSIS — N186 End stage renal disease: Secondary | ICD-10-CM | POA: Diagnosis not present

## 2023-01-06 DIAGNOSIS — N2581 Secondary hyperparathyroidism of renal origin: Secondary | ICD-10-CM | POA: Diagnosis not present

## 2023-01-06 DIAGNOSIS — Z992 Dependence on renal dialysis: Secondary | ICD-10-CM | POA: Diagnosis not present

## 2023-01-06 DIAGNOSIS — N186 End stage renal disease: Secondary | ICD-10-CM | POA: Diagnosis not present

## 2023-01-09 DIAGNOSIS — Z992 Dependence on renal dialysis: Secondary | ICD-10-CM | POA: Diagnosis not present

## 2023-01-09 DIAGNOSIS — N2581 Secondary hyperparathyroidism of renal origin: Secondary | ICD-10-CM | POA: Diagnosis not present

## 2023-01-09 DIAGNOSIS — N186 End stage renal disease: Secondary | ICD-10-CM | POA: Diagnosis not present

## 2023-01-11 DIAGNOSIS — N2581 Secondary hyperparathyroidism of renal origin: Secondary | ICD-10-CM | POA: Diagnosis not present

## 2023-01-11 DIAGNOSIS — Z992 Dependence on renal dialysis: Secondary | ICD-10-CM | POA: Diagnosis not present

## 2023-01-11 DIAGNOSIS — N186 End stage renal disease: Secondary | ICD-10-CM | POA: Diagnosis not present

## 2023-01-13 DIAGNOSIS — Z992 Dependence on renal dialysis: Secondary | ICD-10-CM | POA: Diagnosis not present

## 2023-01-13 DIAGNOSIS — N186 End stage renal disease: Secondary | ICD-10-CM | POA: Diagnosis not present

## 2023-01-13 DIAGNOSIS — N2581 Secondary hyperparathyroidism of renal origin: Secondary | ICD-10-CM | POA: Diagnosis not present

## 2023-01-16 DIAGNOSIS — N2581 Secondary hyperparathyroidism of renal origin: Secondary | ICD-10-CM | POA: Diagnosis not present

## 2023-01-16 DIAGNOSIS — Z992 Dependence on renal dialysis: Secondary | ICD-10-CM | POA: Diagnosis not present

## 2023-01-16 DIAGNOSIS — N186 End stage renal disease: Secondary | ICD-10-CM | POA: Diagnosis not present

## 2023-01-18 DIAGNOSIS — N186 End stage renal disease: Secondary | ICD-10-CM | POA: Diagnosis not present

## 2023-01-18 DIAGNOSIS — Z992 Dependence on renal dialysis: Secondary | ICD-10-CM | POA: Diagnosis not present

## 2023-01-18 DIAGNOSIS — N2581 Secondary hyperparathyroidism of renal origin: Secondary | ICD-10-CM | POA: Diagnosis not present

## 2023-01-20 DIAGNOSIS — N186 End stage renal disease: Secondary | ICD-10-CM | POA: Diagnosis not present

## 2023-01-20 DIAGNOSIS — N2581 Secondary hyperparathyroidism of renal origin: Secondary | ICD-10-CM | POA: Diagnosis not present

## 2023-01-20 DIAGNOSIS — Z992 Dependence on renal dialysis: Secondary | ICD-10-CM | POA: Diagnosis not present

## 2023-01-23 DIAGNOSIS — N2581 Secondary hyperparathyroidism of renal origin: Secondary | ICD-10-CM | POA: Diagnosis not present

## 2023-01-23 DIAGNOSIS — Z992 Dependence on renal dialysis: Secondary | ICD-10-CM | POA: Diagnosis not present

## 2023-01-23 DIAGNOSIS — N186 End stage renal disease: Secondary | ICD-10-CM | POA: Diagnosis not present

## 2023-01-25 DIAGNOSIS — N186 End stage renal disease: Secondary | ICD-10-CM | POA: Diagnosis not present

## 2023-01-25 DIAGNOSIS — Z992 Dependence on renal dialysis: Secondary | ICD-10-CM | POA: Diagnosis not present

## 2023-01-25 DIAGNOSIS — N2581 Secondary hyperparathyroidism of renal origin: Secondary | ICD-10-CM | POA: Diagnosis not present

## 2023-01-30 DIAGNOSIS — Z992 Dependence on renal dialysis: Secondary | ICD-10-CM | POA: Diagnosis not present

## 2023-01-30 DIAGNOSIS — N2581 Secondary hyperparathyroidism of renal origin: Secondary | ICD-10-CM | POA: Diagnosis not present

## 2023-01-30 DIAGNOSIS — N186 End stage renal disease: Secondary | ICD-10-CM | POA: Diagnosis not present

## 2023-02-01 DIAGNOSIS — N2581 Secondary hyperparathyroidism of renal origin: Secondary | ICD-10-CM | POA: Diagnosis not present

## 2023-02-01 DIAGNOSIS — N186 End stage renal disease: Secondary | ICD-10-CM | POA: Diagnosis not present

## 2023-02-01 DIAGNOSIS — Z992 Dependence on renal dialysis: Secondary | ICD-10-CM | POA: Diagnosis not present

## 2023-02-02 DIAGNOSIS — N186 End stage renal disease: Secondary | ICD-10-CM | POA: Diagnosis not present

## 2023-02-02 DIAGNOSIS — Z992 Dependence on renal dialysis: Secondary | ICD-10-CM | POA: Diagnosis not present

## 2023-02-02 DIAGNOSIS — E1129 Type 2 diabetes mellitus with other diabetic kidney complication: Secondary | ICD-10-CM | POA: Diagnosis not present

## 2023-02-03 DIAGNOSIS — N186 End stage renal disease: Secondary | ICD-10-CM | POA: Diagnosis not present

## 2023-02-03 DIAGNOSIS — Z992 Dependence on renal dialysis: Secondary | ICD-10-CM | POA: Diagnosis not present

## 2023-02-03 DIAGNOSIS — N2581 Secondary hyperparathyroidism of renal origin: Secondary | ICD-10-CM | POA: Diagnosis not present

## 2023-02-06 DIAGNOSIS — N2581 Secondary hyperparathyroidism of renal origin: Secondary | ICD-10-CM | POA: Diagnosis not present

## 2023-02-06 DIAGNOSIS — N186 End stage renal disease: Secondary | ICD-10-CM | POA: Diagnosis not present

## 2023-02-06 DIAGNOSIS — Z992 Dependence on renal dialysis: Secondary | ICD-10-CM | POA: Diagnosis not present

## 2023-02-08 DIAGNOSIS — N186 End stage renal disease: Secondary | ICD-10-CM | POA: Diagnosis not present

## 2023-02-08 DIAGNOSIS — Z992 Dependence on renal dialysis: Secondary | ICD-10-CM | POA: Diagnosis not present

## 2023-02-08 DIAGNOSIS — N2581 Secondary hyperparathyroidism of renal origin: Secondary | ICD-10-CM | POA: Diagnosis not present

## 2023-02-10 DIAGNOSIS — N186 End stage renal disease: Secondary | ICD-10-CM | POA: Diagnosis not present

## 2023-02-10 DIAGNOSIS — N2581 Secondary hyperparathyroidism of renal origin: Secondary | ICD-10-CM | POA: Diagnosis not present

## 2023-02-10 DIAGNOSIS — Z992 Dependence on renal dialysis: Secondary | ICD-10-CM | POA: Diagnosis not present

## 2023-02-12 ENCOUNTER — Other Ambulatory Visit: Payer: Self-pay | Admitting: Cardiology

## 2023-02-13 DIAGNOSIS — N2581 Secondary hyperparathyroidism of renal origin: Secondary | ICD-10-CM | POA: Diagnosis not present

## 2023-02-13 DIAGNOSIS — Z992 Dependence on renal dialysis: Secondary | ICD-10-CM | POA: Diagnosis not present

## 2023-02-13 DIAGNOSIS — N186 End stage renal disease: Secondary | ICD-10-CM | POA: Diagnosis not present

## 2023-02-15 DIAGNOSIS — N2581 Secondary hyperparathyroidism of renal origin: Secondary | ICD-10-CM | POA: Diagnosis not present

## 2023-02-15 DIAGNOSIS — N186 End stage renal disease: Secondary | ICD-10-CM | POA: Diagnosis not present

## 2023-02-15 DIAGNOSIS — Z992 Dependence on renal dialysis: Secondary | ICD-10-CM | POA: Diagnosis not present

## 2023-02-16 ENCOUNTER — Other Ambulatory Visit: Payer: Self-pay

## 2023-02-16 ENCOUNTER — Emergency Department (HOSPITAL_COMMUNITY): Payer: Medicare HMO

## 2023-02-16 ENCOUNTER — Encounter (HOSPITAL_COMMUNITY): Payer: Self-pay

## 2023-02-16 ENCOUNTER — Inpatient Hospital Stay (HOSPITAL_COMMUNITY)
Admission: EM | Admit: 2023-02-16 | Discharge: 2023-02-26 | DRG: 480 | Disposition: A | Discharge status: Skilled Nursing Facility | Payer: Medicare HMO | Attending: Internal Medicine | Admitting: Internal Medicine

## 2023-02-16 ENCOUNTER — Inpatient Hospital Stay (HOSPITAL_COMMUNITY): Payer: Medicare HMO

## 2023-02-16 DIAGNOSIS — I509 Heart failure, unspecified: Secondary | ICD-10-CM | POA: Diagnosis not present

## 2023-02-16 DIAGNOSIS — R09A2 Foreign body sensation, throat: Secondary | ICD-10-CM | POA: Diagnosis not present

## 2023-02-16 DIAGNOSIS — R0602 Shortness of breath: Secondary | ICD-10-CM | POA: Diagnosis not present

## 2023-02-16 DIAGNOSIS — E785 Hyperlipidemia, unspecified: Secondary | ICD-10-CM | POA: Diagnosis present

## 2023-02-16 DIAGNOSIS — I350 Nonrheumatic aortic (valve) stenosis: Secondary | ICD-10-CM | POA: Diagnosis not present

## 2023-02-16 DIAGNOSIS — I10 Essential (primary) hypertension: Secondary | ICD-10-CM | POA: Diagnosis present

## 2023-02-16 DIAGNOSIS — W010XXA Fall on same level from slipping, tripping and stumbling without subsequent striking against object, initial encounter: Secondary | ICD-10-CM | POA: Diagnosis present

## 2023-02-16 DIAGNOSIS — K2970 Gastritis, unspecified, without bleeding: Secondary | ICD-10-CM | POA: Diagnosis not present

## 2023-02-16 DIAGNOSIS — R739 Hyperglycemia, unspecified: Secondary | ICD-10-CM | POA: Diagnosis not present

## 2023-02-16 DIAGNOSIS — Z8616 Personal history of COVID-19: Secondary | ICD-10-CM

## 2023-02-16 DIAGNOSIS — K2980 Duodenitis without bleeding: Secondary | ICD-10-CM | POA: Diagnosis not present

## 2023-02-16 DIAGNOSIS — R066 Hiccough: Secondary | ICD-10-CM | POA: Diagnosis not present

## 2023-02-16 DIAGNOSIS — D631 Anemia in chronic kidney disease: Secondary | ICD-10-CM | POA: Diagnosis not present

## 2023-02-16 DIAGNOSIS — Z833 Family history of diabetes mellitus: Secondary | ICD-10-CM

## 2023-02-16 DIAGNOSIS — Z4682 Encounter for fitting and adjustment of non-vascular catheter: Secondary | ICD-10-CM | POA: Diagnosis not present

## 2023-02-16 DIAGNOSIS — I129 Hypertensive chronic kidney disease with stage 1 through stage 4 chronic kidney disease, or unspecified chronic kidney disease: Secondary | ICD-10-CM | POA: Diagnosis not present

## 2023-02-16 DIAGNOSIS — K3189 Other diseases of stomach and duodenum: Secondary | ICD-10-CM | POA: Diagnosis present

## 2023-02-16 DIAGNOSIS — F05 Delirium due to known physiological condition: Secondary | ICD-10-CM

## 2023-02-16 DIAGNOSIS — Z743 Need for continuous supervision: Secondary | ICD-10-CM | POA: Diagnosis not present

## 2023-02-16 DIAGNOSIS — K2971 Gastritis, unspecified, with bleeding: Secondary | ICD-10-CM | POA: Diagnosis not present

## 2023-02-16 DIAGNOSIS — Z794 Long term (current) use of insulin: Secondary | ICD-10-CM

## 2023-02-16 DIAGNOSIS — Z8781 Personal history of (healed) traumatic fracture: Secondary | ICD-10-CM | POA: Diagnosis not present

## 2023-02-16 DIAGNOSIS — K5900 Constipation, unspecified: Secondary | ICD-10-CM | POA: Diagnosis present

## 2023-02-16 DIAGNOSIS — K209 Esophagitis, unspecified without bleeding: Secondary | ICD-10-CM

## 2023-02-16 DIAGNOSIS — N2581 Secondary hyperparathyroidism of renal origin: Secondary | ICD-10-CM | POA: Diagnosis present

## 2023-02-16 DIAGNOSIS — G4701 Insomnia due to medical condition: Secondary | ICD-10-CM | POA: Diagnosis not present

## 2023-02-16 DIAGNOSIS — G9389 Other specified disorders of brain: Secondary | ICD-10-CM | POA: Diagnosis not present

## 2023-02-16 DIAGNOSIS — Y92513 Shop (commercial) as the place of occurrence of the external cause: Secondary | ICD-10-CM | POA: Diagnosis not present

## 2023-02-16 DIAGNOSIS — Z9889 Other specified postprocedural states: Secondary | ICD-10-CM

## 2023-02-16 DIAGNOSIS — N400 Enlarged prostate without lower urinary tract symptoms: Secondary | ICD-10-CM | POA: Diagnosis present

## 2023-02-16 DIAGNOSIS — D649 Anemia, unspecified: Secondary | ICD-10-CM | POA: Diagnosis not present

## 2023-02-16 DIAGNOSIS — A048 Other specified bacterial intestinal infections: Secondary | ICD-10-CM | POA: Diagnosis not present

## 2023-02-16 DIAGNOSIS — D62 Acute posthemorrhagic anemia: Secondary | ICD-10-CM

## 2023-02-16 DIAGNOSIS — S0990XA Unspecified injury of head, initial encounter: Secondary | ICD-10-CM | POA: Diagnosis not present

## 2023-02-16 DIAGNOSIS — W19XXXA Unspecified fall, initial encounter: Secondary | ICD-10-CM | POA: Diagnosis not present

## 2023-02-16 DIAGNOSIS — N186 End stage renal disease: Secondary | ICD-10-CM | POA: Diagnosis not present

## 2023-02-16 DIAGNOSIS — Z992 Dependence on renal dialysis: Secondary | ICD-10-CM | POA: Diagnosis not present

## 2023-02-16 DIAGNOSIS — I5021 Acute systolic (congestive) heart failure: Secondary | ICD-10-CM | POA: Diagnosis not present

## 2023-02-16 DIAGNOSIS — Z01818 Encounter for other preprocedural examination: Secondary | ICD-10-CM | POA: Diagnosis not present

## 2023-02-16 DIAGNOSIS — E1165 Type 2 diabetes mellitus with hyperglycemia: Secondary | ICD-10-CM

## 2023-02-16 DIAGNOSIS — I700 Atherosclerosis of aorta: Secondary | ICD-10-CM | POA: Diagnosis not present

## 2023-02-16 DIAGNOSIS — S72141K Displaced intertrochanteric fracture of right femur, subsequent encounter for closed fracture with nonunion: Secondary | ICD-10-CM | POA: Diagnosis present

## 2023-02-16 DIAGNOSIS — E1122 Type 2 diabetes mellitus with diabetic chronic kidney disease: Secondary | ICD-10-CM | POA: Diagnosis not present

## 2023-02-16 DIAGNOSIS — M79604 Pain in right leg: Secondary | ICD-10-CM | POA: Diagnosis not present

## 2023-02-16 DIAGNOSIS — I5022 Chronic systolic (congestive) heart failure: Secondary | ICD-10-CM | POA: Diagnosis not present

## 2023-02-16 DIAGNOSIS — I5032 Chronic diastolic (congestive) heart failure: Secondary | ICD-10-CM | POA: Diagnosis present

## 2023-02-16 DIAGNOSIS — K219 Gastro-esophageal reflux disease without esophagitis: Secondary | ICD-10-CM | POA: Diagnosis not present

## 2023-02-16 DIAGNOSIS — I2510 Atherosclerotic heart disease of native coronary artery without angina pectoris: Secondary | ICD-10-CM | POA: Diagnosis present

## 2023-02-16 DIAGNOSIS — K2289 Other specified disease of esophagus: Secondary | ICD-10-CM | POA: Diagnosis not present

## 2023-02-16 DIAGNOSIS — Z79899 Other long term (current) drug therapy: Secondary | ICD-10-CM

## 2023-02-16 DIAGNOSIS — S72001A Fracture of unspecified part of neck of right femur, initial encounter for closed fracture: Principal | ICD-10-CM

## 2023-02-16 DIAGNOSIS — I3139 Other pericardial effusion (noninflammatory): Secondary | ICD-10-CM | POA: Diagnosis present

## 2023-02-16 DIAGNOSIS — S72141A Displaced intertrochanteric fracture of right femur, initial encounter for closed fracture: Principal | ICD-10-CM | POA: Diagnosis present

## 2023-02-16 DIAGNOSIS — M1711 Unilateral primary osteoarthritis, right knee: Secondary | ICD-10-CM | POA: Diagnosis not present

## 2023-02-16 DIAGNOSIS — M25551 Pain in right hip: Secondary | ICD-10-CM | POA: Diagnosis not present

## 2023-02-16 DIAGNOSIS — K449 Diaphragmatic hernia without obstruction or gangrene: Secondary | ICD-10-CM | POA: Diagnosis not present

## 2023-02-16 DIAGNOSIS — S72141D Displaced intertrochanteric fracture of right femur, subsequent encounter for closed fracture with routine healing: Secondary | ICD-10-CM | POA: Diagnosis not present

## 2023-02-16 DIAGNOSIS — Z823 Family history of stroke: Secondary | ICD-10-CM | POA: Diagnosis not present

## 2023-02-16 DIAGNOSIS — I132 Hypertensive heart and chronic kidney disease with heart failure and with stage 5 chronic kidney disease, or end stage renal disease: Secondary | ICD-10-CM | POA: Diagnosis not present

## 2023-02-16 DIAGNOSIS — W19XXXD Unspecified fall, subsequent encounter: Secondary | ICD-10-CM | POA: Diagnosis not present

## 2023-02-16 DIAGNOSIS — S7291XA Unspecified fracture of right femur, initial encounter for closed fracture: Secondary | ICD-10-CM | POA: Diagnosis not present

## 2023-02-16 DIAGNOSIS — R9089 Other abnormal findings on diagnostic imaging of central nervous system: Secondary | ICD-10-CM | POA: Diagnosis not present

## 2023-02-16 DIAGNOSIS — I517 Cardiomegaly: Secondary | ICD-10-CM | POA: Diagnosis not present

## 2023-02-16 DIAGNOSIS — I959 Hypotension, unspecified: Secondary | ICD-10-CM | POA: Diagnosis not present

## 2023-02-16 DIAGNOSIS — I120 Hypertensive chronic kidney disease with stage 5 chronic kidney disease or end stage renal disease: Secondary | ICD-10-CM | POA: Diagnosis not present

## 2023-02-16 DIAGNOSIS — I672 Cerebral atherosclerosis: Secondary | ICD-10-CM | POA: Diagnosis not present

## 2023-02-16 DIAGNOSIS — Z7982 Long term (current) use of aspirin: Secondary | ICD-10-CM

## 2023-02-16 HISTORY — DX: Type 2 diabetes mellitus with hyperglycemia: E11.65

## 2023-02-16 HISTORY — DX: Dependence on renal dialysis: N18.6

## 2023-02-16 HISTORY — DX: Displaced intertrochanteric fracture of right femur, subsequent encounter for closed fracture with nonunion: S72.141K

## 2023-02-16 HISTORY — DX: End stage renal disease: Z99.2

## 2023-02-16 LAB — CBC WITH DIFFERENTIAL/PLATELET
Abs Immature Granulocytes: 0.05 10*3/uL (ref 0.00–0.07)
Basophils Absolute: 0.1 10*3/uL (ref 0.0–0.1)
Basophils Relative: 1 %
Eosinophils Absolute: 0.2 10*3/uL (ref 0.0–0.5)
Eosinophils Relative: 2 %
HCT: 28.5 % — ABNORMAL LOW (ref 39.0–52.0)
Hemoglobin: 10.3 g/dL — ABNORMAL LOW (ref 13.0–17.0)
Immature Granulocytes: 1 %
Lymphocytes Relative: 16 %
Lymphs Abs: 1.3 10*3/uL (ref 0.7–4.0)
MCH: 31.7 pg (ref 26.0–34.0)
MCHC: 36.1 g/dL — ABNORMAL HIGH (ref 30.0–36.0)
MCV: 87.7 fL (ref 80.0–100.0)
Monocytes Absolute: 0.7 10*3/uL (ref 0.1–1.0)
Monocytes Relative: 8 %
Neutro Abs: 6.1 10*3/uL (ref 1.7–7.7)
Neutrophils Relative %: 72 %
Platelets: 190 10*3/uL (ref 150–400)
RBC: 3.25 MIL/uL — ABNORMAL LOW (ref 4.22–5.81)
RDW: 13.4 % (ref 11.5–15.5)
WBC: 8.3 10*3/uL (ref 4.0–10.5)
nRBC: 0 % (ref 0.0–0.2)

## 2023-02-16 LAB — BASIC METABOLIC PANEL
Anion gap: 17 — ABNORMAL HIGH (ref 5–15)
BUN: 30 mg/dL — ABNORMAL HIGH (ref 8–23)
CO2: 24 mmol/L (ref 22–32)
Calcium: 8.9 mg/dL (ref 8.9–10.3)
Chloride: 89 mmol/L — ABNORMAL LOW (ref 98–111)
Creatinine, Ser: 6.87 mg/dL — ABNORMAL HIGH (ref 0.61–1.24)
GFR, Estimated: 8 mL/min — ABNORMAL LOW (ref >60)
Glucose, Bld: 535 mg/dL (ref 70–99)
Potassium: 3.8 mmol/L (ref 3.5–5.1)
Sodium: 130 mmol/L — ABNORMAL LOW (ref 135–145)

## 2023-02-16 LAB — APTT: aPTT: 32 s (ref 24–36)

## 2023-02-16 LAB — CBG MONITORING, ED
Glucose-Capillary: 469 mg/dL — ABNORMAL HIGH (ref 70–99)
Glucose-Capillary: 369 mg/dL — ABNORMAL HIGH (ref 70–99)

## 2023-02-16 LAB — PROTIME-INR
INR: 1.1 (ref 0.8–1.2)
Prothrombin Time: 14.3 s (ref 11.4–15.2)

## 2023-02-16 MED ORDER — ACETAMINOPHEN 325 MG PO TABS
650.0000 mg | ORAL_TABLET | Freq: Four times a day (QID) | ORAL | Status: DC | PRN
Start: 2023-02-16 — End: 2023-02-26
  Administered 2023-02-20 – 2023-02-21 (×3): 650 mg via ORAL
  Filled 2023-02-16 (×4): qty 2

## 2023-02-16 MED ORDER — HYDROMORPHONE HCL 1 MG/ML IJ SOLN
1.0000 mg | Freq: Once | INTRAMUSCULAR | Status: AC
  Administered 2023-02-16: 1 mg via INTRAVENOUS
  Filled 2023-02-16: qty 1

## 2023-02-16 MED ORDER — HYDROMORPHONE HCL 1 MG/ML IJ SOLN
1.0000 mg | INTRAMUSCULAR | Status: DC | PRN
  Administered 2023-02-17 – 2023-02-20 (×10): 1 mg via INTRAVENOUS
  Filled 2023-02-16 (×11): qty 1

## 2023-02-16 MED ORDER — ONDANSETRON HCL 4 MG/2ML IJ SOLN: 4.0000 mg | Freq: Four times a day (QID) | INTRAMUSCULAR | Status: DC | PRN

## 2023-02-16 MED ORDER — HYDROCODONE-ACETAMINOPHEN 5-325 MG PO TABS
1.0000 | ORAL_TABLET | Freq: Four times a day (QID) | ORAL | Status: DC | PRN
  Administered 2023-02-18: 1 via ORAL
  Administered 2023-02-18 – 2023-02-20 (×5): 2 via ORAL
  Filled 2023-02-16 (×6): qty 2

## 2023-02-16 MED ORDER — AMLODIPINE BESYLATE 10 MG PO TABS
10.0000 mg | ORAL_TABLET | Freq: Every day | ORAL | Status: DC
  Administered 2023-02-18 – 2023-02-25 (×6): 10 mg via ORAL
  Filled 2023-02-16 (×7): qty 1

## 2023-02-16 MED ORDER — FENTANYL CITRATE PF 50 MCG/ML IJ SOSY
50.0000 ug | PREFILLED_SYRINGE | Freq: Once | INTRAMUSCULAR | Status: AC
  Administered 2023-02-16: 50 ug via INTRAVENOUS
  Filled 2023-02-16: qty 1

## 2023-02-16 MED ORDER — SENNOSIDES-DOCUSATE SODIUM 8.6-50 MG PO TABS: 1.0000 | ORAL_TABLET | Freq: Every evening | ORAL | Status: DC | PRN

## 2023-02-16 MED ORDER — INSULIN GLARGINE-YFGN 100 UNIT/ML ~~LOC~~ SOLN
10.0000 [IU] | Freq: Every day | SUBCUTANEOUS | Status: DC
  Administered 2023-02-16 – 2023-02-17 (×2): 10 [IU] via SUBCUTANEOUS
  Filled 2023-02-16 (×3): qty 0.1

## 2023-02-16 MED ORDER — INSULIN ASPART 100 UNIT/ML IJ SOLN
10.0000 [IU] | Freq: Once | INTRAMUSCULAR | Status: AC
  Administered 2023-02-16: 10 [IU] via SUBCUTANEOUS

## 2023-02-16 MED ORDER — ROSUVASTATIN CALCIUM 20 MG PO TABS
40.0000 mg | ORAL_TABLET | Freq: Every day | ORAL | Status: DC
  Administered 2023-02-18 – 2023-02-25 (×7): 40 mg via ORAL
  Filled 2023-02-16 (×8): qty 2

## 2023-02-16 MED ORDER — INSULIN ASPART 100 UNIT/ML IJ SOLN
0.0000 [IU] | INTRAMUSCULAR | Status: DC
  Administered 2023-02-17: 3 [IU] via SUBCUTANEOUS
  Administered 2023-02-17: 9 [IU] via SUBCUTANEOUS
  Administered 2023-02-17: 3 [IU] via SUBCUTANEOUS
  Administered 2023-02-17: 7 [IU] via SUBCUTANEOUS
  Administered 2023-02-17: 5 [IU] via SUBCUTANEOUS
  Administered 2023-02-18: 2 [IU] via SUBCUTANEOUS
  Administered 2023-02-18 (×2): 3 [IU] via SUBCUTANEOUS
  Administered 2023-02-18: 5 [IU] via SUBCUTANEOUS

## 2023-02-16 MED ORDER — INSULIN ASPART 100 UNIT/ML IJ SOLN: 0.0000 [IU] | Freq: Three times a day (TID) | INTRAMUSCULAR | Status: DC

## 2023-02-16 MED ORDER — ISOSORBIDE MONONITRATE ER 30 MG PO TB24
30.0000 mg | ORAL_TABLET | ORAL | Status: DC
Start: 2023-02-17 — End: 2023-02-26
  Administered 2023-02-18 – 2023-02-25 (×5): 30 mg via ORAL
  Filled 2023-02-16 (×7): qty 1

## 2023-02-16 MED ORDER — BISACODYL 5 MG PO TBEC: 5.0000 mg | DELAYED_RELEASE_TABLET | Freq: Every day | ORAL | Status: DC | PRN

## 2023-02-16 MED ORDER — INSULIN ASPART 100 UNIT/ML IJ SOLN
0.0000 [IU] | Freq: Every day | INTRAMUSCULAR | Status: DC
  Administered 2023-02-16: 5 [IU] via SUBCUTANEOUS

## 2023-02-16 MED ORDER — METOPROLOL SUCCINATE ER 50 MG PO TB24
50.0000 mg | ORAL_TABLET | Freq: Every day | ORAL | Status: DC
  Administered 2023-02-18 – 2023-02-25 (×6): 50 mg via ORAL
  Filled 2023-02-16 (×7): qty 1

## 2023-02-16 NOTE — ED Notes (Signed)
Dr. Craige Cotta notified of critical value face-to-face.

## 2023-02-16 NOTE — H&P (Signed)
History and Physical    Justin [name]:[id number] DOB: 27-Jun-1950 DOA: 02/16/2023  PCP: Adrian Prince, MD  Patient coming from: Home  I have personally briefly reviewed patient's old medical records in [facility]  Chief Complaint: Right hip pain  HPI: Justin [name] is a 73 y.o. male with medical history significant for ESRD on TTS HD, CAD, moderate aortic stenosis, T2DM, HTN, HLD who presented to the ED for evaluation of right hip pain after a fall.  Patient states that he was changing a bucket on a bobcat inside of a concrete building when he lost his balance and fell backwards landing on the ground.  He had immediate pain to his right hip.  He denies hitting his head or losing consciousness.  EMS were called.  He was noted to have gross deformity of the right hip.  He was brought to the ED for further evaluation.  Patient takes aspirin 81 mg daily, no other blood thinners.  He denies any recent chest pain, dyspnea.  He reports attending usual HD, last session was yesterday 2/13.  His primary nephrologist is Dr. Glenna Fellows.  ED Course  Labs/Imaging on admission: I have personally reviewed following labs and imaging studies.  Initial vitals showed BP 110/50, pulse 73, RR 15, temp 98.2 F, SpO2 100% on room air.  Labs show serum glucose 535, sodium 130 (140 when corrected for hyperglycemia), potassium 3.8, bicarb 24, BUN 30, creatinine 6.7, WBC 8.3, hemoglobin 10.3, platelets 190,000.  Pelvic/right femur x-ray showed a comminuted displaced intertrochanteric right proximal femur fracture.  CT head without contrast negative for acute intracranial normality.  CT cervical spine without contrast negative for acute displaced fracture or traumatic listhesis of the C-spine.  Patient was given 10 units subcutaneous NovoLog, IV Dilaudid and fentanyl.  Orthopedics (Dr. Hulda Humphrey) were consulted and recommended medical admission.  The hospitalist service was consulted to admit.  Review of  Systems: All systems reviewed and are negative except as documented in history of present illness above.   Past Medical History:  Diagnosis Date   BPH (benign prostatic hyperplasia)    Chronic kidney disease (CKD), stage IV (severe) (HCC)    Diabetes (HCC)    Dysuria    Erectile dysfunction    Hyperlipemia    Hypertension    Pinna disorder, left    Renal disorder    Wears glasses     Past Surgical History:  Procedure Laterality Date   AV FISTULA PLACEMENT Left 04/30/2018   Procedure: CREATION OF RADIOCEPHALIC ARTERIOVENOUS FISTULA LEFT ARM;  Surgeon: Sherren Kerns, MD;  Location: [facility] OR;  Service: Vascular;  Laterality: Left;   AV FISTULA PLACEMENT Left 08/28/2018   Procedure: ARTERIOVENOUS (AV) BRACHIOCEPHALIC FISTULA CREATION LEFT UPPER ARM;  Surgeon: Cephus Shelling, MD;  Location: [facility] OR;  Service: Vascular;  Laterality: Left;   IR FLUORO GUIDE CV LINE RIGHT  04/29/2018   IR US GUIDE VASC ACCESS RIGHT  04/29/2018   RIGHT/LEFT HEART CATH AND CORONARY ANGIOGRAPHY N/A 05/01/2018   Procedure: RIGHT/LEFT HEART CATH AND CORONARY ANGIOGRAPHY;  Surgeon: Lyn Records, MD;  Location: [facility] INVASIVE CV LAB;  Service: Cardiovascular;  Laterality: N/A;   RIGHT/LEFT HEART CATH AND CORONARY ANGIOGRAPHY N/A 09/29/2022   Procedure: RIGHT/LEFT HEART CATH AND CORONARY ANGIOGRAPHY;  Surgeon: Tonny Bollman, MD;  Location: [facility] INVASIVE CV LAB;  Service: Cardiovascular;  Laterality: N/A;   subdural hematoma Right 07/2020    Social History:  reports that he has never smoked. He has never  used smokeless tobacco. He reports that he does not currently use alcohol. He reports that he does not use drugs.  No Known Allergies  Family History  Problem Relation Age of Onset   Stroke Mother    Diabetes Father      Prior to Admission medications   Medication Sig Start Date End Date Taking? Authorizing Provider  amLODipine (NORVASC) 10 MG tablet Take 1 tablet by mouth once daily 02/12/23    Georgeanna Lea, MD  aspirin EC 81 MG tablet Take 81 mg by mouth daily. Swallow whole.    [provider]  cyanocobalamin (VITAMIN B12) 1000 MCG tablet Take 1,000 mcg by mouth daily.    [provider]  ferric citrate (AURYXIA) 1 GM 210 MG(Fe) tablet Take 630 mg by mouth 3 (three) times daily with meals.    [provider]  hydrALAZINE (APRESOLINE) 25 MG tablet Take 50 mg by mouth See admin instructions. Take 50 mg 3 times daily on Tues, Thurs, Sat. (dialysis days) Take 50 mg twice daily on Sun, Mon, Wed, and Fri (non-dialysis days)    [provider]  isosorbide mononitrate (IMDUR) 30 MG 24 hr tablet Take 1 tablet (30 mg total) by mouth 4 (four) times a week. Mon, Wed, Fri, Sun (non-dialysis days) 10/21/22   Georgeanna Lea, MD  Methoxy PEG-Epoetin Beta Molli Barrows IJ) Mircera Patient not taking: Reported on 12/06/2022 09/12/22 09/11/23  [provider]  metoprolol succinate (TOPROL-XL) 50 MG 24 hr tablet Take 50 mg by mouth daily.    [provider]  nitroGLYCERIN (NITROSTAT) 0.4 MG SL tablet Place 0.4 mg under the tongue every 5 (five) minutes as needed for chest pain.    [provider]  NOVOLIN 70/30 RELION (70-30) 100 UNIT/ML injection Inject 20 Units into the skin daily. 05/02/18   Albertine Grates, MD  rosuvastatin (CRESTOR) 40 MG tablet Take 40 mg by mouth daily. 01/22/22   [provider]    Physical Exam: Vitals:   02/16/23 1811 02/16/23 1812 02/16/23 2220  BP: (!) 110/50  (!) 130/56  Pulse: 73  83  Resp: 15  18  Temp: 98.2 F (36.8 C)  98.4 F (36.9 C)  TempSrc: Oral  Oral  SpO2: 100%  100%  Weight:  81.6 kg   Height:  5\' 9"  (1.753 m)    Constitutional: Resting in bed, NAD, calm, comfortable Eyes: EOMI, lids and conjunctivae normal ENMT: Mucous membranes are moist. Posterior pharynx clear of any exudate or lesions.Normal dentition.  Neck: normal, supple, no masses. Respiratory: clear to auscultation  bilaterally, no wheezing, no crackles. Normal respiratory effort. No accessory muscle use.  Cardiovascular: Regular rate and rhythm, no murmurs / rubs / gallops.  Trace lower extremity edema. 2+ pedal pulses.  LUE AVG with palpable thrill. Abdomen: no tenderness, no masses palpated.  Musculoskeletal: RLE externally rotated, ROM diminished RLE due to hip fracture Skin: no rashes, lesions, ulcers. No induration Neurologic: Sensation intact. Strength diminished RLE due to hip fracture otherwise 5/5 other extremities. Psychiatric: Normal judgment and insight. Alert and oriented x 3. Normal mood.   EKG: Personally reviewed. Sinus rhythm, rate 85, first-degree AV block, RBBB and LAFB.  Bradycardia no longer present when compared to previous.  Assessment/Plan Principal Problem:   Closed comminuted intertrochanteric fracture of proximal end of femur with nonunion, right Active Problems:   Type 2 diabetes mellitus with hyperglycemia (HCC)   Hyperlipidemia   Coronary artery disease involving native coronary artery of native heart without angina  pectoris   Essential hypertension   Anemia in chronic kidney disease   ESRD on hemodialysis (HCC)   Justin [name] is a 73 y.o. male with medical history significant for ESRD on TTS HD, CAD, moderate aortic stenosis, T2DM, HTN, HLD who is admitted with a right hip fracture.  Assessment and Plan: Right intertrochanteric hip fracture: Occurring after mechanical fall.  Orthopedics following with tentative plans for IM nail tomorrow morning.  Hyperglycemic otherwise other chronic comorbidities are stable. -N.p.o. after midnight -Continue analgesics as needed  Type 2 diabetes with hyperglycemia: Hyperglycemic without evidence of DKA/HHS.  Initial serum glucose 535, improving with subcutaneous insulin. -Semglee 10 units nightly -Placed on SSI q4h while NPO  ESRD on TTS HD: No emergent dialysis indication.  Will need nephrology consult in a.m. for routine HD  tomorrow.  Coronary artery disease: Stable, denies chest pain.  Continue Toprol-XL, Imdur, rosuvastatin.  Hypertension: Continue Toprol-XL, Imdur, amlodipine.  Anemia of CKD: Hemoglobin stable at 10.3.  Hyperlipidemia: Continue rosuvastatin.   DVT prophylaxis: SCDs Start: 02/16/23 2147 Full code Status: Full code, confirmed with patient on admission Family Communication: Significant other at bedside Disposition Plan: From home, dispo pending clinical progress Consults called: Orthopedics Severity of Illness: The appropriate patient status for this patient is INPATIENT. Inpatient status is judged to be reasonable and necessary in order to provide the required intensity of service to ensure the patient's safety. The patient's presenting symptoms, physical exam findings, and initial radiographic and laboratory data in the context of their chronic comorbidities is felt to place them at high risk for further clinical deterioration. Furthermore, it is not anticipated that the patient will be medically stable for discharge from the hospital within 2 midnights of admission.   * I certify that at the point of admission it is my clinical judgment that the patient will require inpatient hospital care spanning beyond 2 midnights from the point of admission due to high intensity of service, high risk for further deterioration and high frequency of surveillance required.Darreld Mclean MD [facility]  If 7PM-7AM, please contact night-coverage [email or web address]  02/16/2023, 11:38 PM

## 2023-02-16 NOTE — ED Triage Notes (Signed)
Pt BIB [facility] d/t falling off of a "skid steer" & landing on his Rt side. Pt denies hitting his head or LOC, not aware if he takes any thinners. A/Ox4, EMS reports his CBG was "high," no PIV or pain meds on board. 132/74 & 78 bpm. Upon arrival to ED Rt leg noticed to be rotated outward.

## 2023-02-16 NOTE — ED Provider Notes (Signed)
[place] EMERGENCY DEPARTMENT AT [facility] Provider Note  Arrival date/time:02/16/2023 8:48 PM  HPI/ROS   Justin [name] is a 73 y.o. male with PMH significant for ESRD, CHF (LVEF 60-65% 08/30/22), HTN, T2DM who presents for fall  History is provided by patient. Patient was at home working when he reached for something, lost his balance, and fell onto his right side.  He felt immediate pain in his right hip.   EMS was called.  They observed gross deformity of the right hip.  Patient was neurovascularly intact on their exam.  No medications prior to arrival.  Patient denies hitting his head or neck and denies pain anywhere other than his hip and right upper leg.  Denies blood thinner use.   A complete ROS was performed with pertinent positives/negatives noted above.   ED Course and Medical Decision Making   I personally reviewed the patient's vitals.  Assessment/Plan: This is a 73 year old patient with past medical history as above who is presenting for ground-level fall.  No blood thinner use. On exam, he has a gross deformity of his right lower extremity.  He was given IV pain medication for pain control. Plan to obtain x-rays of his right lower extremity as well as CT head and C-spine given his distracting injury.  Patient's lab workup does show elevated glucose of 535 as well as mildly elevated anion gap to 17. En route with EMS, patient sugar was in the 200s. Fever hyperglycemia is likely in the setting of stress response. Patient does not endorse feeling poorly recently, no symptoms of nausea, vomiting, shortness of breath to indicate concern for DKA at this time. Patient given 10U Novolog for his hyperglycemia. This resulted in mild improvement in blood sugar.  XR does show displaced intertrochanteric right proximal femur. Orthopedic surgery was consulted, spoke with Dr. Hulda Humphrey, who recommended admission with plan for surgical fixation  tomorrow.   Disposition:  I discussed the case with hospitalist team who graciously agreed to admit the patient to their service for continued care.   Clinical Impression:  1. Closed fracture of right hip, initial encounter (HCC)   2. Hyperglycemia     Rx / DC Orders ED Discharge Orders     None       The plan for this patient was discussed with Dr. Rhae Hammock, who voiced agreement and who oversaw evaluation and treatment of this patient.   Clinical Complexity A medically appropriate history, review of systems, and physical exam was performed.  If decision rules were used in this patient's evaluation, they are listed below.   Click here for ABCD2, HEART and other calculatorsREFRESH Note before signing   Patient's presentation is most consistent with acute presentation with potential threat to life or bodily function.  Medical Decision Making Amount and/or Complexity of Data Reviewed Labs: ordered. Radiology: ordered.  Risk Prescription drug management. Decision regarding hospitalization.    Physical Exam and Medical History   Vitals:   02/16/23 1811 02/16/23 1812  BP: (!) 110/50   Pulse: 73   Resp: 15   Temp: 98.2 F (36.8 C)   TempSrc: Oral   SpO2: 100%   Weight:  81.6 kg  Height:  5\' 9"  (1.753 m)     Physical Exam:  General: No distress, appears well hydrated and well nourished   Head: Normocephalic, atraumatic.  No skull depressions or lacerations.  No conjunctival hemorrhage No periorbital ecchymoses, Racoon Eyes, or Battle Sign bilaterally Ears atraumatic No nasal septal deviation or hematoma  PERRL, EOMI, sclera anicteric. Mucus membranes moist.    Neck: Supple, trachea midline No TTP over midline cervical spine, no step offs or deformities.    Cardiovascular: RATE: regular RHYTHM: regular 2+ radial, femoral, DP pulses bilaterally   Respiratory/Chest Wall: Respiratory: normal WOB, breath sounds CTAB Clavicles stable to compression Chest  stable to AP and Lateral Compression,  Chest non tender to palpation No crepitus   Extremities: Warm, well perfused.  Gross deformity of RLE at hip   Gastrointestinal: Abdomen soft, non tender, non distended   Neurologic: LOC: awake/alert EOM:  intact, conjugate   Genitourinary: Normal genitalia   Skin: Normal, no rash or lesions.   Glasgow Coma Scale: Eye opening: 4  Verbal:  5  Motor:  6  GCS Total: 15     Rectal: deferred   Spine: No TTP along midline C/T/L spine, no step offs or deformities    Other:       Medical History: No Known Allergies Past Medical History:  Diagnosis Date   BPH (benign prostatic hyperplasia)    Chronic kidney disease (CKD), stage IV (severe) (HCC)    Diabetes (HCC)    Dysuria    Erectile dysfunction    Hyperlipemia    Hypertension    Pinna disorder, left    Renal disorder    Wears glasses     Past Surgical History:  Procedure Laterality Date   AV FISTULA PLACEMENT Left 04/30/2018   Procedure: CREATION OF RADIOCEPHALIC ARTERIOVENOUS FISTULA LEFT ARM;  Surgeon: Sherren Kerns, MD;  Location: [facility] OR;  Service: Vascular;  Laterality: Left;   AV FISTULA PLACEMENT Left 08/28/2018   Procedure: ARTERIOVENOUS (AV) BRACHIOCEPHALIC FISTULA CREATION LEFT UPPER ARM;  Surgeon: Cephus Shelling, MD;  Location: [facility] OR;  Service: Vascular;  Laterality: Left;   IR FLUORO GUIDE CV LINE RIGHT  04/29/2018   IR US GUIDE VASC ACCESS RIGHT  04/29/2018   RIGHT/LEFT HEART CATH AND CORONARY ANGIOGRAPHY N/A 05/01/2018   Procedure: RIGHT/LEFT HEART CATH AND CORONARY ANGIOGRAPHY;  Surgeon: Lyn Records, MD;  Location: [facility] INVASIVE CV LAB;  Service: Cardiovascular;  Laterality: N/A;   RIGHT/LEFT HEART CATH AND CORONARY ANGIOGRAPHY N/A 09/29/2022   Procedure: RIGHT/LEFT HEART CATH AND CORONARY ANGIOGRAPHY;  Surgeon: Tonny Bollman, MD;  [facility] INVASIVE CV LAB;  Service: Cardiovascular;  Laterality: N/A;   subdural hematoma Right 07/2020   Family  History  Problem Relation Age of Onset   Stroke Mother    Diabetes Father     Social History   Tobacco Use   Smoking status: Never   Smokeless tobacco: Never  Vaping Use   Vaping status: Never Used  Substance Use Topics   Alcohol use: Not Currently   Drug use: Never    Procedures   If procedures were preformed on this patient, they are listed below:  Procedures   -------- HPI and MDM generated using voice dictation software and may contain dictation errors. Please contact me for any clarification or with any questions.   Cephus Slater, MD Emergency Medicine PGY-2    Caron Presume, MD 02/16/23 2049    Durwin Glaze, MD 02/16/23 [id number]

## 2023-02-16 NOTE — Hospital Course (Addendum)
 HPI: Justin [name] is a 73 y.o. male with medical history significant for ESRD on TTS HD, CAD, moderate aortic stenosis, T2DM, HTN, HLD who presented to the ED for evaluation of right hip pain after a fall.   Patient states that he was changing a bucket on a bobcat inside of a concrete building when he lost his balance and fell backwards landing on the ground.  He had immediate pain to his right hip.  He denies hitting his head or losing consciousness.  EMS were called.  He was noted to have gross deformity of the right hip.  He was brought to the ED for further evaluation.   Patient takes aspirin 81 mg daily, no other blood thinners.  He denies any recent chest pain, dyspnea.  He reports attending usual HD, last session was yesterday 2/13.  His primary nephrologist is Dr. Glenna Fellows.   ED Course  Labs/Imaging on admission: I have personally reviewed following labs and imaging studies.   Initial vitals showed BP 110/50, pulse 73, RR 15, temp 98.2 F, SpO2 100% on room air.   Labs show serum glucose 535, sodium 130 (140 when corrected for hyperglycemia), potassium 3.8, bicarb 24, BUN 30, creatinine 6.7, WBC 8.3, hemoglobin 10.3, platelets 190,000.   Pelvic/right femur x-ray showed a comminuted displaced intertrochanteric right proximal femur fracture.   CT head without contrast negative for acute intracranial normality.   CT cervical spine without contrast negative for acute displaced fracture or traumatic listhesis of the C-spine.   Patient was given 10 units subcutaneous NovoLog, IV Dilaudid and fentanyl.  Orthopedics (Dr. Hulda Humphrey) were consulted and recommended medical admission.  The hospitalist service was consulted to admit.   Significant Events: Admitted 02/16/2023 for right intertrochanteric femur fracture 02-17-2023 Right IM nail for intertrochanteric fracture 02/19/2023, 1 unit PRBC transfusion for hemoglobin 6.4. 02/20/2023, overnight with some delirium after hemodialysis.  Clinically  improving.  Needs a skilled nursing facility with dialysis transport.  Significant Labs: WBC 8.3, Hg 10.3, Plt 190 Na 133, K 3.3, Cl 94, BUN 34, Scr 7.83  Significant Imaging Studies: Right femur XR shows Comminuted displaced intertrochanteric right proximal femur fracture. CT head/c-spine shows No acute intracranial abnormality. 2. No acute displaced fracture or traumatic listhesis of the cervical spine. CXR shows Chronic cardiomegaly. No acute chest findings.   Antibiotic Therapy: Anti-infectives (From admission, onward)    Start     Dose/Rate Route Frequency Ordered Stop   02/17/23 0731  ceFAZolin (ANCEF) 2-4 GM/100ML-% IVPB       Note to Pharmacy: Eliott Nine K: cabinet override      02/17/23 0731 02/17/23 1944      Procedures: 02-17-2023 IM nail right femur 02-24-2023 EGD  Consultants: Nephrology Orthopedics GI

## 2023-02-16 NOTE — Consult Note (Signed)
[name]  ORTHOPAEDIC CONSULTATION  REQUESTING PHYSICIAN: Durwin Glaze, MD  ASSESSMENT AND PLAN: 73 y.o. male with the following: Right Hip Intertrochanteric femur fracture  This patient requires inpatient admission to the hospitalist, to include preoperative clearance and perioperative medical management  - Weight Bearing Status/Activity: NWB Right lower extremity  - Additional recommended labs/tests: Preop Labs: BMP, glucose on most recent 535, creatinine 6.87  -VTE Prophylaxis: Please hold prior to OR; to resume POD#1 at the discretion of the primary team  - Pain control: Recommend PO pain medications PRN; judicious use of narcotics  - Follow-up plan: F/u 14 days postop  -Procedures: Plan for OR once patient has been medically optimized  Plan for Right Cephalomedullary nail   Chief Complaint: Right hip pain  HPI: Justin [name] is a 73 y.o. male who presented to the ED for evaluation after sustaining a mechanical fall.  @he Justin [name] is complaining of Right hip pain.   Past Medical History:  Diagnosis Date   BPH (benign prostatic hyperplasia)    Chronic kidney disease (CKD), stage IV (severe) (HCC)    Diabetes (HCC)    Dysuria    Erectile dysfunction    Hyperlipemia    Hypertension    Pinna disorder, left    Renal disorder    Wears glasses    Past Surgical History:  Procedure Laterality Date   AV FISTULA PLACEMENT Left 04/30/2018   Procedure: CREATION OF RADIOCEPHALIC ARTERIOVENOUS FISTULA LEFT ARM;  Surgeon: Sherren Kerns, MD;  Location: [facility] OR;  Service: Vascular;  Laterality: Left;   AV FISTULA PLACEMENT Left 08/28/2018   Procedure: ARTERIOVENOUS (AV) BRACHIOCEPHALIC FISTULA CREATION LEFT UPPER ARM;  Surgeon: Cephus Shelling, MD;  Location: [facility] OR;  Service: Vascular;  Laterality: Left;   IR FLUORO GUIDE CV LINE RIGHT  04/29/2018   IR US GUIDE VASC ACCESS RIGHT  04/29/2018   RIGHT/LEFT HEART CATH AND CORONARY ANGIOGRAPHY N/A 05/01/2018   Procedure: RIGHT/LEFT  HEART CATH AND CORONARY ANGIOGRAPHY;  Surgeon: Lyn Records, MD;  Location: [facility] INVASIVE CV LAB;  Service: Cardiovascular;  Laterality: N/A;   RIGHT/LEFT HEART CATH AND CORONARY ANGIOGRAPHY N/A 09/29/2022   Procedure: RIGHT/LEFT HEART CATH AND CORONARY ANGIOGRAPHY;  Surgeon: Tonny Bollman, MD;  Location: [facility] INVASIVE CV LAB;  Service: Cardiovascular;  Laterality: N/A;   subdural hematoma Right 07/2020   Social History   Socioeconomic History   Marital status: Divorced    Spouse name: Not on file   Number of children: 1   Years of education: Not on file   Highest education level: Not on file  Occupational History   Not on file  Tobacco Use   Smoking status: Never   Smokeless tobacco: Never  Vaping Use   Vaping status: Never Used  Substance and Sexual Activity   Alcohol use: Not Currently   Drug use: Never   Sexual activity: Not Currently  Other Topics Concern   Not on file  Social History Narrative   Not on file   Social Drivers of Health   Financial Resource Strain: Low Risk  (07/13/2020)   Received from [facility]   Overall Financial Resource Strain (CARDIA)    Difficulty of Paying Living Expenses: Not very hard  Food Insecurity: Patient Declined (06/05/2022)   Hunger Vital Sign    Worried About Running Out of Food in the Last Year: Patient declined    Ran Out of Food in the Last Year: Patient declined  Transportation Needs: No Transportation  Needs (08/03/2021)   PRAPARE - Administrator, Civil Service (Medical): No    Lack of Transportation (Non-Medical): No  Physical Activity: Not on file  Stress: Not on file  Social Connections: Not on file   Family History  Problem Relation Age of Onset   Stroke Mother    Diabetes Father    No Known Allergies Prior to Admission medications   Medication Sig Start Date End Date Taking? Authorizing Provider  amLODipine (NORVASC) 10 MG tablet Take 1 tablet by mouth once daily 02/12/23   Georgeanna Lea, MD  aspirin EC 81 MG tablet Take 81 mg by mouth daily. Swallow whole.    [provider]  cyanocobalamin (VITAMIN B12) 1000 MCG tablet Take 1,000 mcg by mouth daily.    [provider]  ferric citrate (AURYXIA) 1 GM 210 MG(Fe) tablet Take 630 mg by mouth 3 (three) times daily with meals.    [provider]  hydrALAZINE (APRESOLINE) 25 MG tablet Take 50 mg by mouth See admin instructions. Take 50 mg 3 times daily on Tues, Thurs, Sat. (dialysis days) Take 50 mg twice daily on Sun, Mon, Wed, and Fri (non-dialysis days)    [provider]  isosorbide mononitrate (IMDUR) 30 MG 24 hr tablet Take 1 tablet (30 mg total) by mouth 4 (four) times a week. Mon, Wed, Fri, Sun (non-dialysis days) 10/21/22   Georgeanna Lea, MD  Methoxy PEG-Epoetin Beta Molli Barrows IJ) Mircera Patient not taking: Reported on 12/06/2022 09/12/22 09/11/23  [provider]  metoprolol succinate (TOPROL-XL) 50 MG 24 hr tablet Take 50 mg by mouth daily.    [provider]  nitroGLYCERIN (NITROSTAT) 0.4 MG SL tablet Place 0.4 mg under the tongue every 5 (five) minutes as needed for chest pain.    [provider]  NOVOLIN 70/30 RELION (70-30) 100 UNIT/ML injection Inject 20 Units into the skin daily. 05/02/18   Albertine Grates, MD  rosuvastatin (CRESTOR) 40 MG tablet Take 40 mg by mouth daily. 01/22/22   [provider]    Family History Reviewed and non-contributory, no pertinent history of problems with bleeding or anesthesia    Review of Systems No fevers or chills No numbness or tingling No chest pain No shortness of breath No bowel or bladder dysfunction No GI distress No headaches    OBJECTIVE  Vitals:Patient Vitals for the past 8 hrs:  BP Temp Temp src Pulse Resp SpO2 Height Weight  02/16/23 1812 -- -- -- -- -- -- 5\' 9"  (1.753 m) 81.6 kg  02/16/23 1811 (!) 110/50 98.2 F (36.8 C) Oral 73 15 100 % -- --   General: Alert, no acute  distress Cardiovascular: Extremities are warm Respiratory: No cyanosis, no use of accessory musculature Skin: No lesions in the area of chief complaint  Neurologic: Sensation intact distally  Psychiatric: Patient is competent for consent with normal mood and affect Lymphatic: No swelling obvious and reported other than the area involved in the exam below Extremities  Right LE: Extremity held in a fixed position.  ROM deferred due to known fracture.  Sensation is intact distally in the sural, saphenous, DP, SP, and plantar nerve distribution. 2+ DP pulse.  Toes are WWP.  Active motion intact in the TA/EHL/GS. Left LE: Sensation is intact distally in the sural, saphenous, DP, SP, and plantar nerve distribution. 2+ DP pulse.  Toes are WWP.  Active motion intact in the TA/EHL/GS. Tolerates gentle ROM of the hip.  No pain  with axial loading.     Test Results Imaging CT Head Wo Contrast Result Date: 02/16/2023 CLINICAL DATA:  fall, distracting injury EXAM: CT HEAD WITHOUT CONTRAST CT CERVICAL SPINE WITHOUT CONTRAST TECHNIQUE: Multidetector CT imaging of the head and cervical spine was performed following the standard protocol without intravenous contrast. Multiplanar CT image reconstructions of the cervical spine were also generated. RADIATION DOSE REDUCTION: This exam was performed according to the departmental dose-optimization program which includes automated exposure control, adjustment of the mA and/or kV according to patient size and/or use of iterative reconstruction technique. COMPARISON:  None Available. FINDINGS: CT HEAD FINDINGS Brain: Cerebral ventricle sizes are concordant with the degree of cerebral volume loss. Patchy and confluent areas of decreased attenuation are noted throughout the deep and periventricular white matter of the cerebral hemispheres bilaterally, compatible with chronic microvascular ischemic disease. Right temporal encephalomalacia. No evidence of large-territorial acute  infarction. No parenchymal hemorrhage. No mass lesion. No extra-axial collection. No mass effect or midline shift. No hydrocephalus. Basilar cisterns are patent. Vascular: No hyperdense vessel. Atherosclerotic calcifications are present within the cavernous internal carotid and vertebral arteries. Skull: No acute fracture or focal lesion.  Prior right burr hole. Sinuses/Orbits: Paranasal sinuses and mastoid air cells are clear. The orbits are unremarkable. Other: None. CT CERVICAL SPINE FINDINGS Alignment: Normal. Skull base and vertebrae: Multilevel degenerative changes of the spine most prominent at the C5-C6 level with associated posterior disc osteophyte complex formation. No severe osseous foraminal or central canal stenosis. No acute fracture. No aggressive appearing focal osseous lesion or focal pathologic process. Soft tissues and spinal canal: No prevertebral fluid or swelling. No visible canal hematoma. Upper chest: Unremarkable. Other: Left neck 3.1 x 1.8 cm fluid dense lesion likely a sebaceous cyst. IMPRESSION: 1. No acute intracranial abnormality. 2. No acute displaced fracture or traumatic listhesis of the cervical spine. Electronically Signed   By: Tish Frederickson M.D.   On: 02/16/2023 20:21   CT Cervical Spine Wo Contrast Result Date: 02/16/2023 CLINICAL DATA:  fall, distracting injury EXAM: CT HEAD WITHOUT CONTRAST CT CERVICAL SPINE WITHOUT CONTRAST TECHNIQUE: Multidetector CT imaging of the head and cervical spine was performed following the standard protocol without intravenous contrast. Multiplanar CT image reconstructions of the cervical spine were also generated. RADIATION DOSE REDUCTION: This exam was performed according to the departmental dose-optimization program which includes automated exposure control, adjustment of the mA and/or kV according to patient size and/or use of iterative reconstruction technique. COMPARISON:  None Available. FINDINGS: CT HEAD FINDINGS Brain: Cerebral  ventricle sizes are concordant with the degree of cerebral volume loss. Patchy and confluent areas of decreased attenuation are noted throughout the deep and periventricular white matter of the cerebral hemispheres bilaterally, compatible with chronic microvascular ischemic disease. Right temporal encephalomalacia. No evidence of large-territorial acute infarction. No parenchymal hemorrhage. No mass lesion. No extra-axial collection. No mass effect or midline shift. No hydrocephalus. Basilar cisterns are patent. Vascular: No hyperdense vessel. Atherosclerotic calcifications are present within the cavernous internal carotid and vertebral arteries. Skull: No acute fracture or focal lesion.  Prior right burr hole. Sinuses/Orbits: Paranasal sinuses and mastoid air cells are clear. The orbits are unremarkable. Other: None. CT CERVICAL SPINE FINDINGS Alignment: Normal. Skull base and vertebrae: Multilevel degenerative changes of the spine most prominent at the C5-C6 level with associated posterior disc osteophyte complex formation. No severe osseous foraminal or central canal stenosis. No acute fracture. No aggressive appearing focal osseous lesion or focal pathologic process. Soft tissues and spinal canal: No  prevertebral fluid or swelling. No visible canal hematoma. Upper chest: Unremarkable. Other: Left neck 3.1 x 1.8 cm fluid dense lesion likely a sebaceous cyst. IMPRESSION: 1. No acute intracranial abnormality. 2. No acute displaced fracture or traumatic listhesis of the cervical spine. Electronically Signed   By: Tish Frederickson M.D.   On: 02/16/2023 20:21   DG Femur Min 2 Views Right Result Date: 02/16/2023 CLINICAL DATA:  Pain after fall.  Right hip pain. EXAM: PELVIS - 1-2 VIEW; RIGHT FEMUR 2 VIEWS COMPARISON:  None Available. FINDINGS: Pelvis: Comminuted displaced intertrochanteric right proximal femur fracture. Femoral head is well seated, no hip dislocation. No additional fracture of the pelvis. The iliac  crests are excluded from the field of view. Pubic rami are intact. Pubic symphysis and sacroiliac joints are congruent. Femur: Distal femur is intact. No additional distal femur fracture. Moderate right knee osteoarthritis. Vascular calcifications are seen. IMPRESSION: Comminuted displaced intertrochanteric right proximal femur fracture. Electronically Signed   By: Narda Rutherford M.D.   On: 02/16/2023 20:08   DG Pelvis 1-2 Views Result Date: 02/16/2023 CLINICAL DATA:  Pain after fall.  Right hip pain. EXAM: PELVIS - 1-2 VIEW; RIGHT FEMUR 2 VIEWS COMPARISON:  None Available. FINDINGS: Pelvis: Comminuted displaced intertrochanteric right proximal femur fracture. Femoral head is well seated, no hip dislocation. No additional fracture of the pelvis. The iliac crests are excluded from the field of view. Pubic rami are intact. Pubic symphysis and sacroiliac joints are congruent. Femur: Distal femur is intact. No additional distal femur fracture. Moderate right knee osteoarthritis. Vascular calcifications are seen. IMPRESSION: Comminuted displaced intertrochanteric right proximal femur fracture. Electronically Signed   By: Narda Rutherford M.D.   On: 02/16/2023 20:08   Labs cbc Recent Labs    02/16/23 1813  WBC 8.3  HGB 10.3*  HCT 28.5*  PLT 190    Labs inflam No results for input(s): "CRP" in the last 72 hours.  Invalid input(s): "ESR"  Labs coag Recent Labs    02/16/23 1813  INR 1.1    Recent Labs    02/16/23 1813  NA 130*  K 3.8  CL 89*  CO2 24  GLUCOSE 535*  BUN 30*  CREATININE 6.87*  CALCIUM 8.9

## 2023-02-17 ENCOUNTER — Inpatient Hospital Stay (HOSPITAL_COMMUNITY): Payer: Medicare HMO

## 2023-02-17 ENCOUNTER — Encounter (HOSPITAL_COMMUNITY): Payer: Self-pay | Admitting: Internal Medicine

## 2023-02-17 ENCOUNTER — Encounter (HOSPITAL_COMMUNITY)
Admission: EM | Disposition: A | Discharge status: Skilled Nursing Facility | Payer: Self-pay | Source: Home / Self Care | Attending: Internal Medicine

## 2023-02-17 ENCOUNTER — Other Ambulatory Visit: Payer: Self-pay

## 2023-02-17 DIAGNOSIS — N186 End stage renal disease: Secondary | ICD-10-CM

## 2023-02-17 DIAGNOSIS — S72141A Displaced intertrochanteric fracture of right femur, initial encounter for closed fracture: Secondary | ICD-10-CM

## 2023-02-17 DIAGNOSIS — Z9889 Other specified postprocedural states: Secondary | ICD-10-CM

## 2023-02-17 DIAGNOSIS — I509 Heart failure, unspecified: Secondary | ICD-10-CM

## 2023-02-17 DIAGNOSIS — Z8781 Personal history of (healed) traumatic fracture: Secondary | ICD-10-CM

## 2023-02-17 DIAGNOSIS — I132 Hypertensive heart and chronic kidney disease with heart failure and with stage 5 chronic kidney disease, or end stage renal disease: Secondary | ICD-10-CM

## 2023-02-17 DIAGNOSIS — S72141K Displaced intertrochanteric fracture of right femur, subsequent encounter for closed fracture with nonunion: Secondary | ICD-10-CM | POA: Diagnosis not present

## 2023-02-17 HISTORY — PX: INTRAMEDULLARY (IM) NAIL INTERTROCHANTERIC: SHX5875

## 2023-02-17 HISTORY — DX: Personal history of (healed) traumatic fracture: Z87.81

## 2023-02-17 LAB — GLUCOSE, CAPILLARY
Glucose-Capillary: 189 mg/dL — ABNORMAL HIGH (ref 70–99)
Glucose-Capillary: 190 mg/dL — ABNORMAL HIGH (ref 70–99)
Glucose-Capillary: 237 mg/dL — ABNORMAL HIGH (ref 70–99)
Glucose-Capillary: 238 mg/dL — ABNORMAL HIGH (ref 70–99)
Glucose-Capillary: 274 mg/dL — ABNORMAL HIGH (ref 70–99)
Glucose-Capillary: 327 mg/dL — ABNORMAL HIGH (ref 70–99)
Glucose-Capillary: 366 mg/dL — ABNORMAL HIGH (ref 70–99)

## 2023-02-17 LAB — CBC
HCT: 23.9 % — ABNORMAL LOW (ref 39.0–52.0)
Hemoglobin: 8.6 g/dL — ABNORMAL LOW (ref 13.0–17.0)
MCH: 31 pg (ref 26.0–34.0)
MCHC: 36 g/dL (ref 30.0–36.0)
MCV: 86.3 fL (ref 80.0–100.0)
Platelets: 171 10*3/uL (ref 150–400)
RBC: 2.77 MIL/uL — ABNORMAL LOW (ref 4.22–5.81)
RDW: 13.2 % (ref 11.5–15.5)
WBC: 9.5 10*3/uL (ref 4.0–10.5)
nRBC: 0 % (ref 0.0–0.2)

## 2023-02-17 LAB — SURGICAL PCR SCREEN
MRSA, PCR: NEGATIVE
Staphylococcus aureus: NEGATIVE

## 2023-02-17 LAB — BASIC METABOLIC PANEL
Anion gap: 13 (ref 5–15)
BUN: 34 mg/dL — ABNORMAL HIGH (ref 8–23)
CO2: 26 mmol/L (ref 22–32)
Calcium: 8.5 mg/dL — ABNORMAL LOW (ref 8.9–10.3)
Chloride: 94 mmol/L — ABNORMAL LOW (ref 98–111)
Creatinine, Ser: 7.83 mg/dL — ABNORMAL HIGH (ref 0.61–1.24)
GFR, Estimated: 7 mL/min — ABNORMAL LOW (ref >60)
Glucose, Bld: 214 mg/dL — ABNORMAL HIGH (ref 70–99)
Potassium: 3.3 mmol/L — ABNORMAL LOW (ref 3.5–5.1)
Sodium: 133 mmol/L — ABNORMAL LOW (ref 135–145)

## 2023-02-17 LAB — HEMOGLOBIN A1C
Hgb A1c MFr Bld: 8.3 % — ABNORMAL HIGH (ref 4.8–5.6)
Mean Plasma Glucose: 191.51 mg/dL

## 2023-02-17 LAB — PHOSPHORUS: Phosphorus: 4.3 mg/dL (ref 2.5–4.6)

## 2023-02-17 LAB — ALBUMIN: Albumin: 3.1 g/dL — ABNORMAL LOW (ref 3.5–5.0)

## 2023-02-17 SURGERY — FIXATION, FRACTURE, INTERTROCHANTERIC, WITH INTRAMEDULLARY ROD
Anesthesia: General | Laterality: Right

## 2023-02-17 MED ORDER — LIDOCAINE 2% (20 MG/ML) 5 ML SYRINGE
INTRAMUSCULAR | Status: DC | PRN
  Administered 2023-02-17: 80 mg via INTRAVENOUS

## 2023-02-17 MED ORDER — SUGAMMADEX SODIUM 200 MG/2ML IV SOLN
INTRAVENOUS | Status: DC | PRN
  Administered 2023-02-17: 200 mg via INTRAVENOUS

## 2023-02-17 MED ORDER — CHLORHEXIDINE GLUCONATE 0.12 % MT SOLN
OROMUCOSAL | Status: AC
Start: 2023-02-17 — End: 2023-02-17
  Administered 2023-02-17: 15 mL via OROMUCOSAL
  Filled 2023-02-17: qty 15

## 2023-02-17 MED ORDER — ORAL CARE MOUTH RINSE: 15.0000 mL | Freq: Once | OROMUCOSAL | Status: AC

## 2023-02-17 MED ORDER — ONDANSETRON HCL 4 MG/2ML IJ SOLN: 4.0000 mg | Freq: Once | INTRAMUSCULAR | Status: DC | PRN

## 2023-02-17 MED ORDER — FENTANYL CITRATE (PF) 250 MCG/5ML IJ SOLN
INTRAMUSCULAR | Status: AC
Start: 2023-02-17 — End: ?
  Filled 2023-02-17: qty 5

## 2023-02-17 MED ORDER — ETOMIDATE 2 MG/ML IV SOLN
INTRAVENOUS | Status: DC | PRN
  Administered 2023-02-17: 16 mg via INTRAVENOUS

## 2023-02-17 MED ORDER — FENTANYL CITRATE (PF) 100 MCG/2ML IJ SOLN
25.0000 ug | INTRAMUSCULAR | Status: DC | PRN
  Administered 2023-02-17 (×2): 50 ug via INTRAVENOUS

## 2023-02-17 MED ORDER — ONDANSETRON HCL 4 MG/2ML IJ SOLN
INTRAMUSCULAR | Status: DC | PRN
Start: 2023-02-17 — End: 2023-02-17
  Administered 2023-02-17: 4 mg via INTRAVENOUS

## 2023-02-17 MED ORDER — CEFAZOLIN SODIUM-DEXTROSE 2-4 GM/100ML-% IV SOLN
INTRAVENOUS | Status: AC
  Filled 2023-02-17: qty 100

## 2023-02-17 MED ORDER — METHOCARBAMOL 500 MG PO TABS
500.0000 mg | ORAL_TABLET | Freq: Three times a day (TID) | ORAL | Status: DC | PRN
  Administered 2023-02-17 – 2023-02-20 (×7): 500 mg via ORAL
  Filled 2023-02-17 (×7): qty 1

## 2023-02-17 MED ORDER — SODIUM CHLORIDE 0.9 % IV SOLN: INTRAVENOUS | Status: DC

## 2023-02-17 MED ORDER — ALBUMIN HUMAN 5 % IV SOLN: 12.5000 g | Freq: Once | INTRAVENOUS | Status: AC

## 2023-02-17 MED ORDER — DEXAMETHASONE SODIUM PHOSPHATE 10 MG/ML IJ SOLN
INTRAMUSCULAR | Status: DC | PRN
  Administered 2023-02-17: 5 mg via INTRAVENOUS

## 2023-02-17 MED ORDER — ACETAMINOPHEN 10 MG/ML IV SOLN
INTRAVENOUS | Status: AC
  Filled 2023-02-17: qty 100

## 2023-02-17 MED ORDER — 0.9 % SODIUM CHLORIDE (POUR BTL) OPTIME
TOPICAL | Status: DC | PRN
  Administered 2023-02-17: 100 mL

## 2023-02-17 MED ORDER — ALBUMIN HUMAN 5 % IV SOLN
INTRAVENOUS | Status: AC
  Administered 2023-02-17: 12.5 g via INTRAVENOUS
  Filled 2023-02-17: qty 250

## 2023-02-17 MED ORDER — CEFAZOLIN SODIUM-DEXTROSE 2-3 GM-%(50ML) IV SOLR
INTRAVENOUS | Status: DC | PRN
  Administered 2023-02-17: 2 g via INTRAVENOUS

## 2023-02-17 MED ORDER — ROCURONIUM BROMIDE 10 MG/ML (PF) SYRINGE
PREFILLED_SYRINGE | INTRAVENOUS | Status: DC | PRN
  Administered 2023-02-17: 60 mg via INTRAVENOUS

## 2023-02-17 MED ORDER — CHLORHEXIDINE GLUCONATE 0.12 % MT SOLN: 15.0000 mL | Freq: Once | OROMUCOSAL | Status: AC

## 2023-02-17 MED ORDER — PHENYLEPHRINE HCL-NACL 20-0.9 MG/250ML-% IV SOLN
INTRAVENOUS | Status: DC | PRN
  Administered 2023-02-17: 30 ug/min via INTRAVENOUS

## 2023-02-17 MED ORDER — MUPIROCIN 2 % EX OINT
1.0000 | TOPICAL_OINTMENT | Freq: Two times a day (BID) | CUTANEOUS | Status: AC
  Administered 2023-02-17 – 2023-02-21 (×9): 1 via NASAL
  Filled 2023-02-17: qty 22

## 2023-02-17 MED ORDER — ACETAMINOPHEN 10 MG/ML IV SOLN
INTRAVENOUS | Status: DC | PRN
  Administered 2023-02-17: 1000 mg via INTRAVENOUS

## 2023-02-17 MED ORDER — PROPOFOL 500 MG/50ML IV EMUL
INTRAVENOUS | Status: DC | PRN
  Administered 2023-02-17: 100 ug/kg/min via INTRAVENOUS

## 2023-02-17 MED ORDER — PHENYLEPHRINE 80 MCG/ML (10ML) SYRINGE FOR IV PUSH (FOR BLOOD PRESSURE SUPPORT)
PREFILLED_SYRINGE | INTRAVENOUS | Status: DC | PRN
  Administered 2023-02-17 (×3): 160 ug via INTRAVENOUS

## 2023-02-17 MED ORDER — INSULIN ASPART 100 UNIT/ML IJ SOLN
0.0000 [IU] | INTRAMUSCULAR | Status: DC | PRN
  Administered 2023-02-17: 2 [IU] via SUBCUTANEOUS

## 2023-02-17 SURGICAL SUPPLY — 35 items
ALCOHOL 70% 16 OZ (MISCELLANEOUS) ×1 IMPLANT
BIT DRILL 4.3MMS DISTAL GRDTED (BIT) IMPLANT
BNDG COHESIVE 6X5 TAN ST LF (GAUZE/BANDAGES/DRESSINGS) ×2 IMPLANT
CANISTER SUCT 3000ML PPV (MISCELLANEOUS) ×1 IMPLANT
COVER PERINEAL POST (MISCELLANEOUS) ×1 IMPLANT
COVER SURGICAL LIGHT HANDLE (MISCELLANEOUS) ×1 IMPLANT
DRAPE C-ARM 42X72 X-RAY (DRAPES) ×1 IMPLANT
DRAPE STERI IOBAN 125X83 (DRAPES) ×1 IMPLANT
DRILL 4.3MMS DISTAL GRADUATED (BIT) ×1 IMPLANT
DRSG TEGADERM 4X4.75 (GAUZE/BANDAGES/DRESSINGS) IMPLANT
DRSG XEROFORM 1X8 (GAUZE/BANDAGES/DRESSINGS) IMPLANT
DURAPREP 26ML APPLICATOR (WOUND CARE) ×1 IMPLANT
ELECT CAUTERY BLADE 6.4 (BLADE) ×1 IMPLANT
ELECT REM PT RETURN 9FT ADLT (ELECTROSURGICAL) IMPLANT
ELECTRODE REM PT RTRN 9FT ADLT (ELECTROSURGICAL) ×1 IMPLANT
GAUZE SPONGE 4X4 12PLY STRL (GAUZE/BANDAGES/DRESSINGS) IMPLANT
GOWN STRL REUS W/ TWL XL LVL3 (GOWN DISPOSABLE) IMPLANT
GUIDEPIN VERSANAIL DSP 3.2X444 (ORTHOPEDIC DISPOSABLE SUPPLIES) IMPLANT
GUIDEWIRE BALL NOSE 100CM (WIRE) IMPLANT
HFN RH 130 DEG 11MM X 420MM (Nail) IMPLANT
KIT BASIN OR (CUSTOM PROCEDURE TRAY) ×1 IMPLANT
KIT TURNOVER KIT B (KITS) ×1 IMPLANT
NS IRRIG 1000ML POUR BTL (IV SOLUTION) ×1 IMPLANT
PACK GENERAL/GYN (CUSTOM PROCEDURE TRAY) ×1 IMPLANT
PAD ARMBOARD 7.5X6 YLW CONV (MISCELLANEOUS) ×2 IMPLANT
SCREW BONE CORTICAL 5.0X44 (Screw) IMPLANT
SCREW LAG 10.5MMX105MM HFN (Screw) IMPLANT
SCREWDRIVER HEX TIP 3.5MM (MISCELLANEOUS) IMPLANT
STAPLER SKIN PROX WIDE 3.9 (STAPLE) IMPLANT
STAPLER VISISTAT 35W (STAPLE) ×1 IMPLANT
SUT VIC AB 0 CT1 27XBRD ANBCTR (SUTURE) IMPLANT
SUT VIC AB 2-0 CT1 TAPERPNT 27 (SUTURE) IMPLANT
TOWEL GREEN STERILE (TOWEL DISPOSABLE) ×1 IMPLANT
TOWEL GREEN STERILE FF (TOWEL DISPOSABLE) ×1 IMPLANT
WATER STERILE IRR 1000ML POUR (IV SOLUTION) ×1 IMPLANT

## 2023-02-17 NOTE — Evaluation (Signed)
Physical Therapy Evaluation Patient Details Name: Justin [name] MRN: [id number] DOB: Feb 14, 1950 Today's Date: 02/17/2023  History of Present Illness  73 year old admitted 2/14 after fall at home with right hip fracture.  IM nail right femur 2/14.  PMH:  ESRD on TTS dialysis, CAD, type 2 diabetes on insulin, hypertension hyperlipidemia  Clinical Impression  Pt admitted with above diagnosis. Pt was able to stand to RW with mod assist but couldn't take steps. Pt needing mod assist for bed mobility as well due to pain.  Significant other is hopeful that she can take pt home and manage his care there.  Pt was independent prior to admit and significantly limited today by pain. Will follow acutely and progress as pt able.  Pt currently with functional limitations due to the deficits listed below (see PT Problem List). Pt will benefit from acute skilled PT to increase their independence and safety with mobility to allow discharge.           If plan is discharge home, recommend the following: A little help with walking and/or transfers;A little help with bathing/dressing/bathroom;Assistance with cooking/housework;Assist for transportation;Help with stairs or ramp for entrance   Can travel by private vehicle        Equipment Recommendations BSC/3in1  Recommendations for Other Services       Functional Status Assessment Patient has had a recent decline in their functional status and demonstrates the ability to make significant improvements in function in a reasonable and predictable amount of time.     Precautions / Restrictions Precautions Precautions: Fall Restrictions Weight Bearing Restrictions Per Provider Order: Yes RLE Weight Bearing Per Provider Order: Weight bearing as tolerated      Mobility  Bed Mobility Overal bed mobility: Needs Assistance Bed Mobility: Rolling, Sidelying to Sit, Sit to Supine Rolling: Mod assist, Used rails Sidelying to sit: Mod assist, Used rails, HOB  elevated   Sit to supine: Max assist, +2 for physical assistance, Used rails   General bed mobility comments: Pt needed mod assist to come to EOB with assist for right LE due to pain. Needed assist to elevate trunk as well.  Pt did assist minimally with scooting to EOB.    Transfers Overall transfer level: Needs assistance Equipment used: Rolling walker (2 wheels) Transfers: Sit to/from Stand, Bed to chair/wheelchair/BSC Sit to Stand: Mod assist, From elevated surface           General transfer comment: Pt able to stand to RW with mod assist to stand with pain reported in right LE. Pt unable to take steps forward but did take 2 steps to left to [facility].  Neeeded assist for stand to sit due to pain right LE.    Ambulation/Gait                  Stairs            Wheelchair Mobility     Tilt Bed    Modified Rankin (Stroke Patients Only)       Balance                                             Pertinent Vitals/Pain Pain Assessment Pain Assessment: No/denies pain    Home Living Family/patient expects to be discharged to:: Private residence Living Arrangements: Spouse/significant other Available Help at Discharge: Family;Available 24 hours/day (for 1 week) Type of Home:  House Home Access: Stairs to enter Entrance Stairs-Rails: Right Entrance Stairs-Number of Steps: 1   Home Layout: One level Home Equipment: Agricultural consultant (2 wheels);Shower seat;Transport chair;Grab bars - toilet;Grab bars - tub/shower;Hand held shower head      Prior Function Prior Level of Function : Independent/Modified Independent             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       Extremity/Trunk Assessment   Upper Extremity Assessment Upper Extremity Assessment: Defer to OT evaluation    Lower Extremity Assessment Lower Extremity Assessment: RLE deficits/detail RLE: Unable to fully assess due to pain    Cervical / Trunk  Assessment Cervical / Trunk Assessment: Normal  Communication   Communication Communication: No apparent difficulties    Cognition Arousal: Lethargic Behavior During Therapy: Flat affect   PT - Cognitive impairments: No apparent impairments                         Following commands: Intact       Cueing Cueing Techniques: Verbal cues, Tactile cues     General Comments General comments (skin integrity, edema, etc.): VSS    Exercises General Exercises - Lower Extremity Ankle Circles/Pumps: AROM, Both, 5 reps, Supine Long Arc Quad: AAROM, 5 reps, Right, Seated Heel Slides: AAROM, Both, 5 reps, Supine   Assessment/Plan    PT Assessment Patient needs continued PT services  PT Problem List Decreased activity tolerance;Decreased balance;Decreased mobility;Decreased knowledge of use of DME;Decreased safety awareness;Decreased knowledge of precautions;Pain;Decreased strength;Decreased range of motion       PT Treatment Interventions DME instruction;Gait training;Functional mobility training;Therapeutic activities;Therapeutic exercise;Balance training;Patient/family education    PT Goals (Current goals can be found in the Care Plan section)  Acute Rehab PT Goals Patient Stated Goal: to go home PT Goal Formulation: With patient Time For Goal Achievement: 03/03/23 Potential to Achieve Goals: Good    Frequency Min 1X/week     Co-evaluation               AM-PAC PT "6 Clicks" Mobility  Outcome Measure Help needed turning from your back to your side while in a flat bed without using bedrails?: A Lot Help needed moving from lying on your back to sitting on the side of a flat bed without using bedrails?: A Lot Help needed moving to and from a bed to a chair (including a wheelchair)?: Total Help needed standing up from a chair using your arms (e.g., wheelchair or bedside chair)?: A Lot Help needed to walk in hospital room?: Total Help needed climbing 3-5 steps  with a railing? : Total 6 Click Score: 9    End of Session Equipment Utilized During Treatment: Gait belt Activity Tolerance: Patient limited by fatigue;Patient limited by pain Patient left: in bed;with call bell/phone within reach;with bed alarm set;with family/visitor present Nurse Communication: Mobility status (Pt in pain and may need muscle relaxer) PT Visit Diagnosis: Unsteadiness on feet (R26.81);Muscle weakness (generalized) (M62.81);Pain Pain - Right/Left: Right Pain - part of body: Leg    Time: 1253-1330 PT Time Calculation (min) (ACUTE ONLY): 37 min   Charges:   PT Evaluation $PT Eval Moderate Complexity: 1 Mod PT Treatments $Therapeutic Activity: 8-22 mins PT General Charges $$ ACUTE PT VISIT: 1 Visit         Grisell Bissette M,PT Acute Rehab Services [phone]   Bevelyn Buckles 02/17/2023, 3:03 PM

## 2023-02-17 NOTE — Transfer of Care (Signed)
Immediate Anesthesia Transfer of Care Note  Patient: Justin [name]  Procedure(s) Performed: INTRAMEDULLARY (IM) NAIL INTERTROCHANTERIC (Right)  Patient Location: PACU  Anesthesia Type:General  Level of Consciousness: awake and sedated  Airway & Oxygen Therapy: Patient Spontanous Breathing and Patient connected to face mask oxygen  Post-op Assessment: Report given to RN and Post -op Vital signs reviewed and stable  Post vital signs: Reviewed and stable  Last Vitals:  Vitals Value Taken Time  BP 77/39 02/17/23 0915  Temp    Pulse 52 02/17/23 0917  Resp 20 02/17/23 0917  SpO2 100 % 02/17/23 0917  Vitals shown include unfiled device data.  Last Pain:  Vitals:   02/17/23 0637  TempSrc: Oral  PainSc:          Complications: No notable events documented.

## 2023-02-17 NOTE — Progress Notes (Signed)
PROGRESS NOTE    Justin [name]  QIO:[id number] DOB: 1950/10/07 DOA: 02/16/2023 PCP: Adrian Prince, MD    Brief Narrative:  73 year old with ESRD on TTS dialysis, CAD, type 2 diabetes on insulin, hypertension hyperlipidemia who was working at home, lost his balance and fell back landing on the ground, right hip pain.  In the emergency room hemodynamically stable.  Found to have right hip fracture.  Subjective: Patient seen and examined . Came back from surgery . Already walked with PT and it was draining to him. Underwent ORIF. Assessment & Plan:   Close traumatic right intertrochanteric hip fracture: Status post ORIF, IM nail right femur. Dr Hulda Humphrey 2/15 Weightbearing as tolerated Adequate pain control IV and oral opiates Start working with PT OT. DVT prophylaxis, SCDs today.  Will start heparin tomorrow.  ESRD on hemodialysis: Last dialysis 2/13.  Nephrology notified for dialysis need.  Type 2 diabetes with hyperglycemia: Blood sugars elevated.  Started back on long-acting insulin.  Keep every 4 hours insulin.  Chronic medical issues including Coronary artery disease, stable and currently on aspirin, Toprol-XL, Imdur and statin. Hypertension, as above.  Stable. Hyperlipidemia, on rosuvastatin.   DVT prophylaxis: SCDs Start: 02/16/23 2147   Code Status: Full code Family Communication: wife at the bedside  Disposition Plan: Status is: Inpatient Remains inpatient appropriate because: Immediate postop     Consultants:  Orthopedics  Procedures:  Right femoral IM nailing  Antimicrobials:  Perioperative     Objective: Vitals:   02/17/23 0925 02/17/23 0930 02/17/23 0945 02/17/23 1000  BP: (!) 84/36 (!) 101/43 (!) 122/47 (!) 123/47  Pulse: (!) 52 (!) 55 (!) 56 (!) 57  Resp: (!) 22 (!) 21 20 20   Temp:    98 F (36.7 C)  TempSrc:    Oral  SpO2: 100% 100% 99% 98%  Weight:      Height:       No intake or output data in the 24 hours ending 02/17/23 1244 Filed  Weights   02/16/23 1812 02/17/23 0633  Weight: 81.6 kg 81.6 kg    Examination:  Physical Exam Constitutional:      Appearance: Normal appearance.  HENT:     Head: Normocephalic.  Cardiovascular:     Rate and Rhythm: Regular rhythm.     Heart sounds: Normal heart sounds.  Pulmonary:     Breath sounds: Normal breath sounds.  Abdominal:     Palpations: Abdomen is soft.  Musculoskeletal:     Comments: Left UE AV fistula   Skin:    Comments: Post op dressing intact   Neurological:     Mental Status: He is alert.        Data Reviewed: I have personally reviewed following labs and imaging studies  CBC: Recent Labs  Lab 02/16/23 1813 02/17/23 0552  WBC 8.3 9.5  NEUTROABS 6.1  --   HGB 10.3* 8.6*  HCT 28.5* 23.9*  MCV 87.7 86.3  PLT 190 171   Basic Metabolic Panel: Recent Labs  Lab 02/16/23 1813 02/17/23 0559  NA 130* 133*  K 3.8 3.3*  CL 89* 94*  CO2 24 26  GLUCOSE 535* 214*  BUN 30* 34*  CREATININE 6.87* 7.83*  CALCIUM 8.9 8.5*   GFR: Estimated Creatinine Clearance: 8.5 mL/min (A) (by C-G formula based on SCr of 7.83 mg/dL (H)). Liver Function Tests: No results for input(s): "AST", "ALT", "ALKPHOS", "BILITOT", "PROT", "ALBUMIN" in the last 168 hours. No results for input(s): "LIPASE", "AMYLASE" in the last 168 hours.  No results for input(s): "AMMONIA" in the last 168 hours. Coagulation Profile: Recent Labs  Lab 02/16/23 1813  INR 1.1   Cardiac Enzymes: No results for input(s): "CKTOTAL", "CKMB", "CKMBINDEX", "TROPONINI" in the last 168 hours. BNP (last 3 results) No results for input(s): "PROBNP" in the last 8760 hours. HbA1C: Recent Labs    02/17/23 0559  HGBA1C 8.3*   CBG: Recent Labs  Lab 02/17/23 0030 02/17/23 0453 02/17/23 0643 02/17/23 0914 02/17/23 1153  GLUCAP 366* 238* 189* 190* 237*   Lipid Profile: No results for input(s): "CHOL", "HDL", "LDLCALC", "TRIG", "CHOLHDL", "LDLDIRECT" in the last 72 hours. Thyroid Function  Tests: No results for input(s): "TSH", "T4TOTAL", "FREET4", "T3FREE", "THYROIDAB" in the last 72 hours. Anemia Panel: No results for input(s): "VITAMINB12", "FOLATE", "FERRITIN", "TIBC", "IRON", "RETICCTPCT" in the last 72 hours. Sepsis Labs: No results for input(s): "PROCALCITON", "LATICACIDVEN" in the last 168 hours.  Recent Results (from the past 240 hours)  Surgical PCR screen     Status: None   Collection Time: 02/17/23  2:45 AM   Specimen: Nasal Mucosa; Nasal Swab  Result Value Ref Range Status   MRSA, PCR NEGATIVE NEGATIVE Final   Staphylococcus aureus NEGATIVE NEGATIVE Final    Comment: (NOTE) The Xpert SA Assay (FDA approved for NASAL specimens in patients 20 years of age and older), is one component of a comprehensive surveillance program. It is not intended to diagnose infection nor to guide or monitor treatment. Performed at [facility], [address], [place], [place] [address]          Radiology Studies: DG HIP UNILAT WITH PELVIS 2-3 VIEWS RIGHT Result Date: 02/17/2023 CLINICAL DATA:  Elective surgery. EXAM: DG HIP (WITH OR WITHOUT PELVIS) 2-3V RIGHT COMPARISON:  Preoperative imaging FINDINGS: Six fluoroscopic spot views of the right hip and femur submitted from the operating room. Femoral intramedullary nail with trans trochanteric and distal locking screw fixation traverse proximal femur fracture. Fluoroscopy time 1 minutes 27 seconds. Dose 13.22 mGy. IMPRESSION: Intraoperative fluoroscopy during proximal femur fracture fixation. Electronically Signed   By: Narda Rutherford M.D.   On: 02/17/2023 10:44   DG C-Arm 1-60 Min-No Report Result Date: 02/17/2023 Fluoroscopy was utilized by the requesting physician.  No radiographic interpretation.   DG C-Arm 1-60 Min-No Report Result Date: 02/17/2023 Fluoroscopy was utilized by the requesting physician.  No radiographic interpretation.   Chest Portable 1 View Result Date: 02/16/2023 CLINICAL DATA:  Preop exam.   Fall.  Right femur fracture. EXAM: PORTABLE CHEST 1 VIEW COMPARISON:  Radiograph 06/05/2022 FINDINGS: Similar cardiomegaly. Unchanged mediastinal contours. Aortic atherosclerosis. Left lung base and costophrenic angle are excluded from the field of view. No pulmonary edema or pneumothorax. No evidence of focal airspace disease. No large pleural effusion. Left axillary stent is partially included. On limited assessment, no acute osseous findings. IMPRESSION: Chronic cardiomegaly.  No acute chest findings. Electronically Signed   By: Narda Rutherford M.D.   On: 02/16/2023 22:48   CT Head Wo Contrast Result Date: 02/16/2023 CLINICAL DATA:  fall, distracting injury EXAM: CT HEAD WITHOUT CONTRAST CT CERVICAL SPINE WITHOUT CONTRAST TECHNIQUE: Multidetector CT imaging of the head and cervical spine was performed following the standard protocol without intravenous contrast. Multiplanar CT image reconstructions of the cervical spine were also generated. RADIATION DOSE REDUCTION: This exam was performed according to the departmental dose-optimization program which includes automated exposure control, adjustment of the mA and/or kV according to patient size and/or use of iterative reconstruction technique. COMPARISON:  None  Available. FINDINGS: CT HEAD FINDINGS Brain: Cerebral ventricle sizes are concordant with the degree of cerebral volume loss. Patchy and confluent areas of decreased attenuation are noted throughout the deep and periventricular white matter of the cerebral hemispheres bilaterally, compatible with chronic microvascular ischemic disease. Right temporal encephalomalacia. No evidence of large-territorial acute infarction. No parenchymal hemorrhage. No mass lesion. No extra-axial collection. No mass effect or midline shift. No hydrocephalus. Basilar cisterns are patent. Vascular: No hyperdense vessel. Atherosclerotic calcifications are present within the cavernous internal carotid and vertebral arteries.  Skull: No acute fracture or focal lesion.  Prior right burr hole. Sinuses/Orbits: Paranasal sinuses and mastoid air cells are clear. The orbits are unremarkable. Other: None. CT CERVICAL SPINE FINDINGS Alignment: Normal. Skull base and vertebrae: Multilevel degenerative changes of the spine most prominent at the C5-C6 level with associated posterior disc osteophyte complex formation. No severe osseous foraminal or central canal stenosis. No acute fracture. No aggressive appearing focal osseous lesion or focal pathologic process. Soft tissues and spinal canal: No prevertebral fluid or swelling. No visible canal hematoma. Upper chest: Unremarkable. Other: Left neck 3.1 x 1.8 cm fluid dense lesion likely a sebaceous cyst. IMPRESSION: 1. No acute intracranial abnormality. 2. No acute displaced fracture or traumatic listhesis of the cervical spine. Electronically Signed   By: Tish Frederickson M.D.   On: 02/16/2023 20:21   CT Cervical Spine Wo Contrast Result Date: 02/16/2023 CLINICAL DATA:  fall, distracting injury EXAM: CT HEAD WITHOUT CONTRAST CT CERVICAL SPINE WITHOUT CONTRAST TECHNIQUE: Multidetector CT imaging of the head and cervical spine was performed following the standard protocol without intravenous contrast. Multiplanar CT image reconstructions of the cervical spine were also generated. RADIATION DOSE REDUCTION: This exam was performed according to the departmental dose-optimization program which includes automated exposure control, adjustment of the mA and/or kV according to patient size and/or use of iterative reconstruction technique. COMPARISON:  None Available. FINDINGS: CT HEAD FINDINGS Brain: Cerebral ventricle sizes are concordant with the degree of cerebral volume loss. Patchy and confluent areas of decreased attenuation are noted throughout the deep and periventricular white matter of the cerebral hemispheres bilaterally, compatible with chronic microvascular ischemic disease. Right temporal  encephalomalacia. No evidence of large-territorial acute infarction. No parenchymal hemorrhage. No mass lesion. No extra-axial collection. No mass effect or midline shift. No hydrocephalus. Basilar cisterns are patent. Vascular: No hyperdense vessel. Atherosclerotic calcifications are present within the cavernous internal carotid and vertebral arteries. Skull: No acute fracture or focal lesion.  Prior right burr hole. Sinuses/Orbits: Paranasal sinuses and mastoid air cells are clear. The orbits are unremarkable. Other: None. CT CERVICAL SPINE FINDINGS Alignment: Normal. Skull base and vertebrae: Multilevel degenerative changes of the spine most prominent at the C5-C6 level with associated posterior disc osteophyte complex formation. No severe osseous foraminal or central canal stenosis. No acute fracture. No aggressive appearing focal osseous lesion or focal pathologic process. Soft tissues and spinal canal: No prevertebral fluid or swelling. No visible canal hematoma. Upper chest: Unremarkable. Other: Left neck 3.1 x 1.8 cm fluid dense lesion likely a sebaceous cyst. IMPRESSION: 1. No acute intracranial abnormality. 2. No acute displaced fracture or traumatic listhesis of the cervical spine. Electronically Signed   By: Tish Frederickson M.D.   On: 02/16/2023 20:21   DG Femur Min 2 Views Right Result Date: 02/16/2023 CLINICAL DATA:  Pain after fall.  Right hip pain. EXAM: PELVIS - 1-2 VIEW; RIGHT FEMUR 2 VIEWS COMPARISON:  None Available. FINDINGS: Pelvis: Comminuted displaced intertrochanteric right proximal femur fracture.  Femoral head is well seated, no hip dislocation. No additional fracture of the pelvis. The iliac crests are excluded from the field of view. Pubic rami are intact. Pubic symphysis and sacroiliac joints are congruent. Femur: Distal femur is intact. No additional distal femur fracture. Moderate right knee osteoarthritis. Vascular calcifications are seen. IMPRESSION: Comminuted displaced  intertrochanteric right proximal femur fracture. Electronically Signed   By: Narda Rutherford M.D.   On: 02/16/2023 20:08   DG Pelvis 1-2 Views Result Date: 02/16/2023 CLINICAL DATA:  Pain after fall.  Right hip pain. EXAM: PELVIS - 1-2 VIEW; RIGHT FEMUR 2 VIEWS COMPARISON:  None Available. FINDINGS: Pelvis: Comminuted displaced intertrochanteric right proximal femur fracture. Femoral head is well seated, no hip dislocation. No additional fracture of the pelvis. The iliac crests are excluded from the field of view. Pubic rami are intact. Pubic symphysis and sacroiliac joints are congruent. Femur: Distal femur is intact. No additional distal femur fracture. Moderate right knee osteoarthritis. Vascular calcifications are seen. IMPRESSION: Comminuted displaced intertrochanteric right proximal femur fracture. Electronically Signed   By: Narda Rutherford M.D.   On: 02/16/2023 20:08        Scheduled Meds:  amLODipine  10 mg Oral Daily   insulin aspart  0-9 Units Subcutaneous Q4H   insulin glargine-yfgn  10 Units Subcutaneous QHS   isosorbide mononitrate  30 mg Oral Once per day on Sunday Tuesday Thursday Saturday   metoprolol succinate  50 mg Oral Daily   mupirocin ointment  1 Application Nasal BID   rosuvastatin  40 mg Oral Daily   Continuous Infusions:  ceFAZolin       LOS: 1 day    Time spent: 35 minutes    Dorcas Carrow, MD [facility]

## 2023-02-17 NOTE — Anesthesia Postprocedure Evaluation (Signed)
Anesthesia Post Note  Patient: Justin [name]  Procedure(s) Performed: INTRAMEDULLARY (IM) NAIL INTERTROCHANTERIC (Right)     Patient location during evaluation: PACU Anesthesia Type: General Level of consciousness: awake and alert Pain management: pain level controlled Vital Signs Assessment: post-procedure vital signs reviewed and stable Respiratory status: spontaneous breathing, nonlabored ventilation, respiratory function stable and patient connected to nasal cannula oxygen Cardiovascular status: blood pressure returned to baseline and stable Postop Assessment: no apparent nausea or vomiting Anesthetic complications: no   No notable events documented.  Last Vitals:  Vitals:   02/17/23 0945 02/17/23 1000  BP: (!) 122/47 (!) 123/47  Pulse: (!) 56 (!) 57  Resp: 20 20  Temp:  36.7 C  SpO2: 99% 98%    Last Pain:  Vitals:   02/17/23 1000  TempSrc: Oral  PainSc:                  Collene Schlichter

## 2023-02-17 NOTE — Anesthesia Procedure Notes (Signed)
Procedure Name: Intubation Date/Time: 02/17/2023 7:48 AM  Performed by: Debbe Odea, CRNAPre-anesthesia Checklist: Patient identified, Emergency Drugs available, Suction available and Patient being monitored Patient Re-evaluated:Patient Re-evaluated prior to induction Oxygen Delivery Method: Circle System Utilized Preoxygenation: Pre-oxygenation with 100% oxygen Induction Type: IV induction Ventilation: Mask ventilation without difficulty Laryngoscope Size: Glidescope (hyperangulated S3) Grade View: Grade I Tube type: Oral Tube size: 7.5 mm Number of attempts: 1 Airway Equipment and Method: Stylet Placement Confirmation: ETT inserted through vocal cords under direct vision, positive ETCO2 and breath sounds checked- equal and bilateral Secured at: 24 cm Tube secured with: Tape Dental Injury: Teeth and Oropharynx as per pre-operative assessment

## 2023-02-17 NOTE — Op Note (Signed)
.Orthopaedic Surgery Operative Note (CSN: [id number])  Justin [name]  01/18/1950 Date of Surgery: 02/17/2023   Diagnoses:  RIGHT INTERTROCHANTERIC HIP FRACTURE  Procedure: RIGHT HIP CEPHALOMEDULLARY NAIL   Post-operative plan: The patient will be WBAT RLE.  The patient will be evaluated by PT and OT.  DVT prophylaxis per primary team, no orthopedic contraindications.   Pain control with PRN pain medication preferring oral medicines.  Follow up plan will be scheduled in approximately 14 days for incision check and XR Right femur.  Post-Op Diagnosis: Same Surgeons:Primary: Luci Bank, MD Assistants: None Location: [facility] OR ROOM 07 Anesthesia: General Antibiotics: Ancef 2 g Tourniquet time:  N/A Estimated Blood Loss: 150 cc Complications: None Specimens: None Implants: Implant Name Type Inv. Item Serial No. Manufacturer Lot No. LRB No. Used Action  HFN RH 130 DEG X - [id number] Nail HFN RH 130 DEG X  [employer](ORTH,TRAU,BIO,SG) [id number] Right 1 Implanted  SCREW LAG 10.5MMX105MM HFN - [id number] Screw SCREW LAG 10.5MMX105MM HFN  [employer](ORTH,TRAU,BIO,SG) [id number] Right 1 Implanted  SCREW BONE CORTICAL 5.0X44 - [id number] Screw SCREW BONE CORTICAL 5.0X44  [employer](ORTH,TRAU,BIO,SG) [id number] Right 1 Implanted    Indications for Surgery:   Justin [name] is a 73 y.o. male with with medical history significant for ESRD on TTS HD, CAD, moderate aortic stenosis, T2DM, HTN, HLD who presented to the ED for evaluation of right hip pain after a fall.  Found to have a right intertrochanteric hip fracture.  He is indicated for a right hip cephalomedullary nail.    Risks and benefits of surgery discussed with patient all questions answered. These risks include but are not limited to infection, bleeding, damage neurovascular structures, failure to heal, malunion, nonunion, prominent hardware, failure of hardware, need for additional surgery, DVT, need for assistive  device, prolonged rehab and cardiopulmonary complications from anesthesia. The patient understands these risks and wished to proceed with surgery    Procedure:   The patient was identified properly. Informed consent was obtained and the surgical site was marked. The patient was taken up to suite where general anesthesia was induced.  The patient was placed supine on a fracture table and appropriate reduction was obtained and visualized on fluoroscopy prior to the beginning of the procedure.  The right lower extremity was prepped and draped in the usual sterile fashion. We made an incision proximal to the greater trochanter and dissected down through the fascia.  We then carefully placed our starting wire localizing under fluoroscopy prior to advancing the wire into the bone. An Entry reamer was used to open the canal and a ball-tipped guidewire was passed into the femoral canal.  The wire was passed to an appropriate level past the isthmus.  We selected a length of nail noted above.  At this point we placed our nail localizing under fluoroscopy that it was at the appropriate level prior to using the outrigger device to pass a wire and then the cephalo-medullary screw.  The screw was locked proximally to avoid over collapse.  We took final shots at the proximal femur.  Using perfect circle technique 1 distal interlock screw was placed. Final pictures were obtained.  The wounds were thoroughly irrigated closed in a multilayer fashion with absorable sutures and skin staples.  A sterile dressing was placed.  The patient was awoken from general anesthesia and taken to the PACU in stable condition without complication.      Luci Bank 02/17/2023, 9:27 AM Orthopaedic Surgery

## 2023-02-17 NOTE — Progress Notes (Signed)
73 year old male with a right intertrochanteric hip fracture.  He is indicated for a right hip cephalomedullary nail  Risks and benefits of surgery discussed with patient all questions answered.  These risks include but are not limited to infection, bleeding, damage neurovascular structures, failure to heal, malunion, nonunion, prominent hardware, failure of hardware, need for additional surgery, DVT, need for assistive device, prolonged rehab and cardiopulmonary complications from anesthesia.  The patient understands these risks and wished to proceed with surgery

## 2023-02-17 NOTE — Anesthesia Preprocedure Evaluation (Addendum)
Anesthesia Evaluation  Patient identified by MRN, date of birth, ID band Patient awake    Reviewed: Allergy & Precautions, NPO status , Patient's Chart, lab work & pertinent test results, reviewed documented beta blocker date and time   Airway Mallampati: II  TM Distance: >3 FB Neck ROM: Full    Dental  (+) Teeth Intact, Dental Advisory Given   Pulmonary neg pulmonary ROS   Pulmonary exam normal breath sounds clear to auscultation       Cardiovascular hypertension, Pt. on home beta blockers and Pt. on medications + CAD and +CHF  + Valvular Problems/Murmurs AS  Rhythm:Regular Rate:Normal + Systolic murmurs Echo 08/30/22:  1. Technically difficult study.   2. Left ventricular ejection fraction, by estimation, is 60 to 65%.The  left ventricle has no regional wall motion abnormalities. There is  moderate concentric left ventricular hypertrophy. Left ventricular  diastolic parameters are consistent with Grade I   diastolic dysfunction (impaired relaxation). The average left ventricular  global longitudinal strain is -14.4 %   3. Right ventricular systolic function is normal. The right ventricular  size is normal.   4. LA volume index 45.4 mL/m. Left atrial size was severely dilated.   5. Trivial mitral valve regurgitation.   6. Low-flow low gradient aortic stenosis, paradoxical, severe by valve  area calculated, visually appears moderately stenotic. Aortic valve area,  by VTI measures 0.85 cm. Aortic valve mean gradient measures 19.0 mmHg.  Aortic valve Vmax measures 2.92 m/s   Stroke-volume index 29.4 mL/m. Mild aortic insufficiency.   7. The inferior vena cava is normal in size with greater than 50%  respiratory variability, suggesting right atrial pressure of 3 mmHg.   8. A small to moderate size pericardial effusion is present. The  pericardial effusion is circumferential. There is no evidence of cardiac  tamponade.      Neuro/Psych negative neurological ROS     GI/Hepatic negative GI ROS, Neg liver ROS,,,  Endo/Other  diabetes, Type 2, Insulin Dependent    Renal/GU ESRFRenal disease (TTS HD)     Musculoskeletal R HIP FRACTURE   Abdominal   Peds  Hematology  (+) Blood dyscrasia, anemia   Anesthesia Other Findings Day of surgery medications reviewed with the patient.  Reproductive/Obstetrics                              Anesthesia Physical Anesthesia Plan  ASA: 4  Anesthesia Plan: General   Post-op Pain Management: Ofirmev IV (intra-op)*   Induction: Intravenous  PONV Risk Score and Plan: 2 and Dexamethasone and Ondansetron  Airway Management Planned: Oral ETT  Additional Equipment: ClearSight  Intra-op Plan:   Post-operative Plan: Extubation in OR  Informed Consent: I have reviewed the patients History and Physical, chart, labs and discussed the procedure including the risks, benefits and alternatives for the proposed anesthesia with the patient or authorized representative who has indicated his/her understanding and acceptance.     Dental advisory given  Plan Discussed with: CRNA  Anesthesia Plan Comments:          Anesthesia Quick Evaluation

## 2023-02-17 NOTE — Progress Notes (Signed)
Orthopedic Tech Progress Note Patient Details:  Justin [name] 02-17-1950 [id number]  Patient ID: [name], male   DOB: Oct 13, 1950, 73 y.o.   MRN: [id number] Pt doesn't meet criteria for ohf. Pt must be under 70 to get ohf. Trinna Post 02/17/2023, 4:42 AM

## 2023-02-17 NOTE — Consult Note (Addendum)
Renal Service Consult Note [facility]  Justin [name] 02/17/2023 Justin Krabbe, MD Requesting Physician: Dr. Jerral Ralph  Reason for Consult: ESRD pt w/ hip fracture HPI: The patient is a 73 y.o. year-old w/ PMH as below who presented to ED after a fall w/ R hip pain. In ED VSS, temp 98, 100% RA. Creat 6.7, K 3.8.   Xrays showed comminuted displaced intertrochanteric R prox femur fracture. Pt admitted. Ortho team took to OR today for R hip medullary nail.  We are asked to see for ESRD.    Pt seen in room. Pt is in pain and not giving much history. Wife at bedside give hx of pt working a job in [employer] or something similar. Works hard and is quite strong. Doesn't like dialysis but goes to all his sessions. Lost his appetite about 6 mos ago and only usually eats 1 meal a day. Wonders if he is depressed. No other issues.   ROS - denies CP, no joint pain, no HA, no blurry vision, no rash, no diarrhea, no nausea/ vomiting, no dysuria, no difficulty voiding   Past Medical History  Past Medical History:  Diagnosis Date   BPH (benign prostatic hyperplasia)    Chronic kidney disease (CKD), stage IV (severe) (HCC)    Diabetes (HCC)    Dysuria    Erectile dysfunction    Hyperlipemia    Hypertension    Pinna disorder, left    Renal disorder    Wears glasses    Past Surgical History  Past Surgical History:  Procedure Laterality Date   AV FISTULA PLACEMENT Left 04/30/2018   Procedure: CREATION OF RADIOCEPHALIC ARTERIOVENOUS FISTULA LEFT ARM;  Surgeon: Sherren Kerns, MD;  Location: [facility] OR;  Service: Vascular;  Laterality: Left;   AV FISTULA PLACEMENT Left 08/28/2018   Procedure: ARTERIOVENOUS (AV) BRACHIOCEPHALIC FISTULA CREATION LEFT UPPER ARM;  Surgeon: Cephus Shelling, MD;  Location: [facility] OR;  Service: Vascular;  Laterality: Left;   IR FLUORO GUIDE CV LINE RIGHT  04/29/2018   IR US GUIDE VASC ACCESS RIGHT  04/29/2018   RIGHT/LEFT HEART CATH AND CORONARY ANGIOGRAPHY  N/A 05/01/2018   Procedure: RIGHT/LEFT HEART CATH AND CORONARY ANGIOGRAPHY;  Surgeon: Lyn Records, MD;  Location: [facility] INVASIVE CV LAB;  Service: Cardiovascular;  Laterality: N/A;   RIGHT/LEFT HEART CATH AND CORONARY ANGIOGRAPHY N/A 09/29/2022   Procedure: RIGHT/LEFT HEART CATH AND CORONARY ANGIOGRAPHY;  Surgeon: Tonny Bollman, MD;  Location: [facility] INVASIVE CV LAB;  Service: Cardiovascular;  Laterality: N/A;   subdural hematoma Right 07/2020   Family History  Family History  Problem Relation Age of Onset   Stroke Mother    Diabetes Father    Social History  reports that he has never smoked. He has never used smokeless tobacco. He reports that he does not currently use alcohol. He reports that he does not use drugs. Allergies No Known Allergies Home medications Prior to Admission medications   Medication Sig Start Date End Date Taking? Authorizing Provider  amLODipine (NORVASC) 10 MG tablet Take 1 tablet by mouth once daily 02/12/23  Yes Georgeanna Lea, MD  aspirin EC 81 MG tablet Take 81 mg by mouth daily. Swallow whole.   Yes [provider]  cyanocobalamin (VITAMIN B12) 1000 MCG tablet Take 1,000 mcg by mouth daily.   Yes [provider]  ferric citrate (AURYXIA) 1 GM 210 MG(Fe) tablet Take 630 mg by mouth 3 (three) times daily with meals.   Yes [provider]  hydrALAZINE (APRESOLINE) 25 MG tablet Take 50 mg by mouth See admin instructions. Take 50 mg 3 times daily on Tues, Thurs, Sat. (dialysis days) Take 50 mg twice daily on Sun, Mon, Wed, and Fri (non-dialysis days)   Yes [provider]  isosorbide mononitrate (IMDUR) 30 MG 24 hr tablet Take 1 tablet (30 mg total) by mouth 4 (four) times a week. Mon, Wed, Fri, Sun (non-dialysis days) 10/21/22  Yes Georgeanna Lea, MD  metoprolol succinate (TOPROL-XL) 50 MG 24 hr tablet Take 50 mg by mouth daily.   Yes [provider]  NOVOLIN 70/30 RELION (70-30) 100 UNIT/ML injection Inject 20 Units  into the skin daily. 05/02/18  Yes Albertine Grates, MD  rosuvastatin (CRESTOR) 40 MG tablet Take 40 mg by mouth daily. 01/22/22  Yes [provider]  Methoxy PEG-Epoetin Beta (MIRCERA IJ)  09/12/22 09/11/23  [provider]  nitroGLYCERIN (NITROSTAT) 0.4 MG SL tablet Place 0.4 mg under the tongue every 5 (five) minutes as needed for chest pain. Patient not taking: Reported on 02/16/2023    [provider]     Vitals:   02/17/23 0925 02/17/23 0930 02/17/23 0945 02/17/23 1000  BP: (!) 84/36 (!) 101/43 (!) 122/47 (!) 123/47  Pulse: (!) 52 (!) 55 (!) 56 (!) 57  Resp: (!) 22 (!) 21 20 20   Temp:    98 F (36.7 C)  TempSrc:    Oral  SpO2: 100% 100% 99% 98%  Weight:      Height:       Exam Gen alert, no distress No rash, cyanosis or gangrene Sclera anicteric, throat clear  No jvd or bruits Chest clear bilat to bases, no rales/ wheezing RRR no MRG Abd soft ntnd no mass or ascites +bs GU defer MS no joint effusions or deformity Ext no LE or UE edema, no other edema Neuro is alert, Ox 3 , nf    LUA AVF+bruit       Renal-related home meds: - ferric citrate 3 ac tid - hydralazine 50 tid  - norvasc 10 - metoprolol xl 50 every day - others: imdur, sl ntg prn, statin, novolin insulin, asa    OP HD: TTS [place] 4h  B450  81.1kg   2K bath  L AVF  Heparin none  - last HD 2/13, post wt 81.6kg, gets to dry wt, wt gain ~ 3kg - hectorol 2 mcg IV  - no esa - last Hb 10.5, ferritin 1605, pth 222   Assessment/ Plan: R hip fracture - after a fall. SP medullary nail procedure done today 2/15 by orthopedic surgery.  ESRD - on HD TTS. Pt is stable, no vol or lab issues. Will place on the list for dialysis today/ tonight.  HTN - BP's 80s on arrival, 120/80 now. Will lower BP meds for now.  Volume - no vol overload. Keep even on HD today.  Anemia of esrd - Hb 10 > 8.3. Not on esa at OP unit, lats Hb 10.5. transfuse prn.  Secondary hyperparathyroidism - Ca in range, add  alb/phos. Cont binders w/ meals and IV vdra.       [name]  MD [facility] 02/17/2023, 12:55 PM  Recent Labs  Lab 02/16/23 1813 02/17/23 0552 02/17/23 0559  HGB 10.3* 8.6*  --   CALCIUM 8.9  --  8.5*  CREATININE 6.87*  --  7.83*  K 3.8  --  3.3*   Inpatient medications:  amLODipine  10 mg Oral Daily  insulin aspart  0-9 Units Subcutaneous Q4H   insulin glargine-yfgn  10 Units Subcutaneous QHS   isosorbide mononitrate  30 mg Oral Once per day on Sunday Tuesday Thursday Saturday   metoprolol succinate  50 mg Oral Daily   mupirocin ointment  1 Application Nasal BID   rosuvastatin  40 mg Oral Daily    ceFAZolin     acetaminophen, bisacodyl, ceFAZolin, HYDROcodone-acetaminophen, HYDROmorphone (DILAUDID) injection, ondansetron (ZOFRAN) IV, senna-docusate

## 2023-02-18 DIAGNOSIS — S72141K Displaced intertrochanteric fracture of right femur, subsequent encounter for closed fracture with nonunion: Secondary | ICD-10-CM | POA: Diagnosis not present

## 2023-02-18 LAB — GLUCOSE, CAPILLARY
Glucose-Capillary: 200 mg/dL — ABNORMAL HIGH (ref 70–99)
Glucose-Capillary: 221 mg/dL — ABNORMAL HIGH (ref 70–99)
Glucose-Capillary: 227 mg/dL — ABNORMAL HIGH (ref 70–99)
Glucose-Capillary: 247 mg/dL — ABNORMAL HIGH (ref 70–99)
Glucose-Capillary: 260 mg/dL — ABNORMAL HIGH (ref 70–99)
Glucose-Capillary: 287 mg/dL — ABNORMAL HIGH (ref 70–99)

## 2023-02-18 LAB — CBC WITH DIFFERENTIAL/PLATELET
Abs Immature Granulocytes: 0.06 10*3/uL (ref 0.00–0.07)
Basophils Absolute: 0 10*3/uL (ref 0.0–0.1)
Basophils Relative: 0 %
Eosinophils Absolute: 0 10*3/uL (ref 0.0–0.5)
Eosinophils Relative: 0 %
HCT: 19.5 % — ABNORMAL LOW (ref 39.0–52.0)
Hemoglobin: 7.1 g/dL — ABNORMAL LOW (ref 13.0–17.0)
Immature Granulocytes: 1 %
Lymphocytes Relative: 9 %
Lymphs Abs: 1 10*3/uL (ref 0.7–4.0)
MCH: 31.3 pg (ref 26.0–34.0)
MCHC: 36.4 g/dL — ABNORMAL HIGH (ref 30.0–36.0)
MCV: 85.9 fL (ref 80.0–100.0)
Monocytes Absolute: 0.9 10*3/uL (ref 0.1–1.0)
Monocytes Relative: 9 %
Neutro Abs: 8.9 10*3/uL — ABNORMAL HIGH (ref 1.7–7.7)
Neutrophils Relative %: 81 %
Platelets: 152 10*3/uL (ref 150–400)
RBC: 2.27 MIL/uL — ABNORMAL LOW (ref 4.22–5.81)
RDW: 13.5 % (ref 11.5–15.5)
WBC: 10.9 10*3/uL — ABNORMAL HIGH (ref 4.0–10.5)
nRBC: 0 % (ref 0.0–0.2)

## 2023-02-18 LAB — HEPATITIS B SURFACE ANTIGEN: Hepatitis B Surface Ag: NONREACTIVE

## 2023-02-18 MED ORDER — INSULIN GLARGINE-YFGN 100 UNIT/ML ~~LOC~~ SOLN
12.0000 [IU] | Freq: Every day | SUBCUTANEOUS | Status: DC
  Administered 2023-02-18 – 2023-02-25 (×8): 12 [IU] via SUBCUTANEOUS
  Filled 2023-02-18 (×9): qty 0.12

## 2023-02-18 MED ORDER — CHLORHEXIDINE GLUCONATE CLOTH 2 % EX PADS
6.0000 | MEDICATED_PAD | Freq: Every day | CUTANEOUS | Status: DC
  Administered 2023-02-19: 6 via TOPICAL

## 2023-02-18 MED ORDER — NEPRO/CARBSTEADY PO LIQD
237.0000 mL | Freq: Three times a day (TID) | ORAL | Status: DC
  Administered 2023-02-18 – 2023-02-25 (×10): 237 mL via ORAL
  Filled 2023-02-18: qty 237

## 2023-02-18 MED ORDER — INSULIN ASPART 100 UNIT/ML IJ SOLN
0.0000 [IU] | Freq: Every day | INTRAMUSCULAR | Status: DC
  Administered 2023-02-18 – 2023-02-19 (×2): 3 [IU] via SUBCUTANEOUS
  Administered 2023-02-20 – 2023-02-25 (×6): 2 [IU] via SUBCUTANEOUS

## 2023-02-18 MED ORDER — INSULIN ASPART 100 UNIT/ML IJ SOLN
3.0000 [IU] | Freq: Three times a day (TID) | INTRAMUSCULAR | Status: DC
  Administered 2023-02-18 – 2023-02-25 (×17): 3 [IU] via SUBCUTANEOUS

## 2023-02-18 MED ORDER — INSULIN ASPART 100 UNIT/ML IJ SOLN
0.0000 [IU] | Freq: Three times a day (TID) | INTRAMUSCULAR | Status: DC
Start: 2023-02-18 — End: 2023-02-26
  Administered 2023-02-18: 2 [IU] via SUBCUTANEOUS
  Administered 2023-02-19: 5 [IU] via SUBCUTANEOUS
  Administered 2023-02-19: 3 [IU] via SUBCUTANEOUS
  Administered 2023-02-19: 2 [IU] via SUBCUTANEOUS
  Administered 2023-02-20: 1 [IU] via SUBCUTANEOUS
  Administered 2023-02-20: 2 [IU] via SUBCUTANEOUS
  Administered 2023-02-20: 1 [IU] via SUBCUTANEOUS
  Administered 2023-02-21: 2 [IU] via SUBCUTANEOUS
  Administered 2023-02-21 (×2): 1 [IU] via SUBCUTANEOUS
  Administered 2023-02-22: 2 [IU] via SUBCUTANEOUS
  Administered 2023-02-23: 3 [IU] via SUBCUTANEOUS
  Administered 2023-02-23: 1 [IU] via SUBCUTANEOUS
  Administered 2023-02-23: 3 [IU] via SUBCUTANEOUS
  Administered 2023-02-24 (×2): 1 [IU] via SUBCUTANEOUS
  Administered 2023-02-24 – 2023-02-25 (×3): 2 [IU] via SUBCUTANEOUS
  Administered 2023-02-25: 1 [IU] via SUBCUTANEOUS
  Administered 2023-02-26: 2 [IU] via SUBCUTANEOUS

## 2023-02-18 MED ORDER — RENA-VITE PO TABS
1.0000 | ORAL_TABLET | Freq: Every day | ORAL | Status: DC
  Administered 2023-02-18 – 2023-02-25 (×8): 1 via ORAL
  Filled 2023-02-18 (×8): qty 1

## 2023-02-18 NOTE — Progress Notes (Signed)
Initial Nutrition Assessment  DOCUMENTATION CODES:   Not applicable  INTERVENTION:   -Continue with carb modified diet -Renal MVI daily -Nepro Shake po TID, each supplement provides 425 kcal and 19 grams protein  -Sent page to DM coordinator for further recommendations due to persistent hyperglycemia (suspect inadequate insulin regimen PTA due to uncontrolled DM)  NUTRITION DIAGNOSIS:   Increased nutrient needs related to post-op healing as evidenced by estimated needs.  GOAL:   Patient will meet greater than or equal to 90% of their needs  MONITOR:   PO intake, Supplement acceptance  REASON FOR ASSESSMENT:   Consult Assessment of nutrition requirement/status, Hip fracture protocol  ASSESSMENT:   Pt with medical history significant for ESRD on TTS HD, CAD, moderate aortic stenosis, T2DM, HTN, HLD who presented for evaluation of right hip pain after a fall.  Pt admitted with rt hip fracture.   2/15- s/p RIGHT HIP CEPHALOMEDULLARY NAIL  Reviewed I/O's: -285 ml x 24 hours  UOP: 525 ml daily  Pt unavailable at time of visit. Attempted to speak with pt via call to hospital room phone, however, unable to reach. RD unable to obtain further nutrition-related history or complete nutrition-focused physical exam at this time.    Per nephrology notes, pt does not like HD, but complaint with sessions. Pt has had decreased appetite over the past 6 months and usually consumes only one meal per day. Noted EDW 81.1 kg. Pt on a TTS schedule for HD PTA. Wt has been stable over the past 6 months. Pt slightly under dry weight currently. Per nursing notes, pt also with rt lower extremity pitting edema, which may be masking true weight loss as well as fat and muscle depletions.  Pt currently on a carb modified diet. Noted meal completions 80%. RD agrees with liberalized diet given poor oral intake and HD labs WDL. Noted continued hyperglycemia; pt with poor glycemic control PTA and suspect  insulin regimen PTA may be inadequate.   Medications reviewed.   Lab Results  Component Value Date   HGBA1C 8.3 (H) 02/17/2023   PTA DM medications are 20 units novolin 70/30 daily.   Labs reviewed: Na: 133, K: 3.3, Phos and Mg WDL. CBGS: 221-274 (inpatient orders for glycemic control are 0-9 units insulin aspart every 4 hours and 10 units insulin glargine-yfgn daily).    Diet Order:   Diet Order             Diet Carb Modified Fluid consistency: Thin; Room service appropriate? Yes  Diet effective now                   EDUCATION NEEDS:   No education needs have been identified at this time  Skin:  Skin Assessment: Skin Integrity Issues: Skin Integrity Issues:: Incisions Incisions: closed rt hip  Last BM:  Unknown  Height:   Ht Readings from Last 1 Encounters:  02/17/23 5' 9.02" (1.753 m)    Weight:   Wt Readings from Last 1 Encounters:  02/17/23 81.6 kg    Ideal Body Weight:  72.7 kg  BMI:  Body mass index is 26.57 kg/m.  Estimated Nutritional Needs:   Kcal:  2050-2250  Protein:  105-120 grams  Fluid:  1000 ml + UOP    Levada Schilling, RD, LDN, CDCES Registered Dietitian III Certified Diabetes Care and Education Specialist If unable to reach this RD, please use "RD Inpatient" group chat on secure chat between hours of 8am-4 pm daily

## 2023-02-18 NOTE — TOC CAGE-AID Note (Signed)
Transition of Care [facility]) - CAGE-AID Screening  Patient Details  Name: Justin [name] MRN: [id number] Date of Birth: 31-Mar-1950  Clinical Narrative:  Patient denies any current alcohol or drug use. Denies need for substance abuse resources at this time.  CAGE-AID Screening:   Have You Ever Felt You Ought to Cut Down on Your Drinking or Drug Use?: No Have People Annoyed You By Critizing Your Drinking Or Drug Use?: No Have You Felt Bad Or Guilty About Your Drinking Or Drug Use?: No Have You Ever Had a Drink or Used Drugs First Thing In The Morning to Steady Your Nerves or to Get Rid of a Hangover?: No CAGE-AID Score: 0  Substance Abuse Education Offered: No

## 2023-02-18 NOTE — Evaluation (Signed)
Occupational Therapy Evaluation Patient Details Name: Justin [name] MRN: [id number] DOB: 1950-09-17 Today's Date: 02/18/2023   History of Present Illness   73 year old admitted 2/14 after fall at home with right hip fracture.  IM nail right femur 2/14.  PMH:  ESRD on TTS dialysis, CAD, type 2 diabetes on insulin, hypertension hyperlipidemia     Clinical Impressions Patient admitted for the diagnosis above.  PTA he lives at home with his spouse and was independent with all ADL,iADL and mobility.  Currently pain is his limiting factor.  He is needing up to Mod A for basic mobility at RW level and for lower body ADL.  OT will continue efforts in the acute setting and HH OT can be considered.       If plan is discharge home, recommend the following:   Assist for transportation;A lot of help with bathing/dressing/bathroom;A little help with walking and/or transfers;Assistance with cooking/housework     Functional Status Assessment   Patient has had a recent decline in their functional status and demonstrates the ability to make significant improvements in function in a reasonable and predictable amount of time.     Equipment Recommendations   BSC/3in1;Tub/shower seat     Recommendations for Other Services         Precautions/Restrictions   Precautions Precautions: Fall Restrictions Weight Bearing Restrictions Per Provider Order: Yes RLE Weight Bearing Per Provider Order: Weight bearing as tolerated     Mobility Bed Mobility Overal bed mobility: Needs Assistance Bed Mobility: Sit to Supine       Sit to supine: Max assist        Transfers Overall transfer level: Needs assistance Equipment used: Rolling walker (2 wheels) Transfers: Sit to/from Stand Sit to Stand: Min assist, Mod assist                  Balance Overall balance assessment: Needs assistance Sitting-balance support: Feet supported Sitting balance-Leahy Scale: Fair   Postural control:  Posterior lean Standing balance support: Reliant on assistive device for balance Standing balance-Leahy Scale: Poor                             ADL either performed or assessed with clinical judgement   ADL       Grooming: Wash/dry hands;Supervision/safety;Sitting               Lower Body Dressing: Maximal assistance;Sit to/from stand;Sitting/lateral leans   Toilet Transfer: Moderate assistance;BSC/3in1;Stand-pivot;Minimal assistance                   Vision Patient Visual Report: No change from baseline       Perception Perception: Not tested       Praxis Praxis: Not tested       Pertinent Vitals/Pain Pain Assessment Pain Assessment: Faces Faces Pain Scale: Hurts even more Pain Location: R hip with movement Pain Descriptors / Indicators: Grimacing, Guarding, Sharp, Restless Pain Intervention(s): Monitored during session     Extremity/Trunk Assessment Upper Extremity Assessment Upper Extremity Assessment: Overall WFL for tasks assessed   Lower Extremity Assessment Lower Extremity Assessment: Defer to PT evaluation   Cervical / Trunk Assessment Cervical / Trunk Assessment: Normal   Communication Communication Communication: No apparent difficulties   Cognition Arousal: Alert Behavior During Therapy: WFL for tasks assessed/performed Cognition: No apparent impairments  Cueing  General Comments       VSS on RA   Exercises     Shoulder Instructions      Home Living Family/patient expects to be discharged to:: Private residence Living Arrangements: Spouse/significant other Available Help at Discharge: Family;Available 24 hours/day Type of Home: House Home Access: Stairs to enter [employer] of Steps: 1 Entrance Stairs-Rails: Right Home Layout: One level     Bathroom Shower/Tub: Producer, television/film/video: Standard     Home Equipment: Clinical biochemist (2 wheels);Shower seat;Transport chair;Grab bars - toilet;Grab bars - tub/shower;Hand held shower head          Prior Functioning/Environment Prior Level of Function : Independent/Modified Independent             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 Comments: Ind    OT Problem List: Decreased strength;Decreased range of motion;Decreased activity tolerance;Impaired balance (sitting and/or standing);Decreased safety awareness;Pain;Decreased knowledge of use of DME or AE   OT Treatment/Interventions: Self-care/ADL training;Therapeutic activities;DME and/or AE instruction;Patient/family education;Balance training      OT Goals(Current goals can be found in the care plan section)   Acute Rehab OT Goals Patient Stated Goal: Hoping to return home OT Goal Formulation: With patient Time For Goal Achievement: 03/05/23 Potential to Achieve Goals: Good ADL Goals Pt Will Perform Grooming: with supervision;standing Pt Will Perform Lower Body Dressing: with min assist;sit to/from stand Pt Will Transfer to Toilet: with supervision;regular height toilet;ambulating   OT Frequency:  Min 1X/week    Co-evaluation              AM-PAC OT "6 Clicks" Daily Activity     Outcome Measure Help from another person eating meals?: None Help from another person taking care of personal grooming?: A Little Help from another person toileting, which includes using toliet, bedpan, or urinal?: A Lot Help from another person bathing (including washing, rinsing, drying)?: A Lot Help from another person to put on and taking off regular upper body clothing?: None Help from another person to put on and taking off regular lower body clothing?: A Lot 6 Click Score: 17   End of Session Equipment Utilized During Treatment: Gait belt;Rolling walker (2 wheels) Nurse Communication: Mobility status  Activity Tolerance: Patient limited by pain Patient left: in bed;with call bell/phone within  reach;with nursing/sitter in room  OT Visit Diagnosis: Unsteadiness on feet (R26.81);Muscle weakness (generalized) (M62.81);Pain Pain - Right/Left: Right Pain - part of body: Leg                Time: 4132-4401 OT Time Calculation (min): 19 min Charges:  OT General Charges $OT Visit: 1 Visit OT Evaluation $OT Eval Moderate Complexity: 1 Mod  02/18/2023  RP, OTR/L  Acute Rehabilitation Services  Office:  [phone]   Suzanna Obey 02/18/2023, 1:01 PM

## 2023-02-18 NOTE — Progress Notes (Signed)
Physical Therapy Treatment Patient Details Name: Justin [name] MRN: [id number] DOB: 16-Jan-1950 Today's Date: 02/18/2023   History of Present Illness 73 year old admitted 2/14 after fall at home with right hip fracture.  IM nail right femur 2/14.  PMH:  ESRD on TTS dialysis, CAD, type 2 diabetes on insulin, hypertension hyperlipidemia    PT Comments  Continuing work on functional mobility and activity tolerance;  Session focused on functional transfers and initial gait training, with progress noted; Still needing at least mod assist to sit up to EOB; tends to tense up, and valsalva; took it slowly with step by step cueing; Able to stand to RW with min assist; cues for hand placement as he tends to pull up on RW; Able to walk with RW about 8 feet, at first with NWB RLE (advancing bil LEs at the same time), progressing to taking on a little weight RLE in stance, and separating steps to approximate a more normal gait pattern;   Pt and significant other are wondering about getting home tomorrow, and considering pt is WBAT, that is not entirely unreasonable; Wil lneed to be able to get up to EOB, stand from bed, walk household distances, and manage a few steps consistently to be able to get home;   Discussed with Nephrology NP -- will try to get PT/OT sessions in before HD   If plan is discharge home, recommend the following: A little help with walking and/or transfers;A little help with bathing/dressing/bathroom;Assistance with cooking/housework;Assist for transportation;Help with stairs or ramp for entrance   Can travel by private vehicle        Equipment Recommendations  BSC/3in1    Recommendations for Other Services       Precautions / Restrictions Precautions Precautions: Fall Restrictions Weight Bearing Restrictions Per Provider Order: Yes RLE Weight Bearing Per Provider Order: Weight bearing as tolerated     Mobility  Bed Mobility Overal bed mobility: Needs Assistance Bed  Mobility: Supine to Sit     Supine to sit: Mod assist     General bed mobility comments: Step-by-step cues for technique; used half-bridge to position hip towards EOB; mod handheld assist to pull to sit, incr time once upright to get LLE more under center of mass; inititally very dependent on UE support in sitting    Transfers Overall transfer level: Needs assistance Equipment used: Rolling walker (2 wheels) Transfers: Sit to/from Stand Sit to Stand: Min assist, +2 safety/equipment           General transfer comment: Cues for hand placement, and incr time for Justin [name] to problem-solve through weight placement; good, solid rise once he found the movement pattern righ tfor him; decr control of descent to sit    Ambulation/Gait Ambulation/Gait assistance: Contact guard assist, +2 safety/equipment (chair follow) Gait Distance (Feet): 10 Feet Assistive device: Rolling walker (2 wheels) Gait Pattern/deviations: Step-to pattern       General Gait Details: Cues for gait sequence; Painful RLE in stance, and initailly NWB RLE, but progressed to a little bit of weight acceptance; used UE support on RW to offload RLE in stance   Stairs             Wheelchair Mobility     Tilt Bed    Modified Rankin (Stroke Patients Only)       Balance  Communication Communication Communication: No apparent difficulties  Cognition Arousal: Alert Behavior During Therapy: WFL for tasks assessed/performed   PT - Cognitive impairments: No apparent impairments                         Following commands: Intact      Cueing Cueing Techniques: Verbal cues, Tactile cues  Exercises General Exercises - Lower Extremity Ankle Circles/Pumps: AROM, Both, 5 reps, Supine Quad Sets: AROM, Right, 5 reps    General Comments General comments (skin integrity, edema, etc.): Significant other, Justin [name], present and helpful       Pertinent Vitals/Pain Pain Assessment Pain Assessment: Faces Faces Pain Scale: Hurts even more Pain Location: R hip with movement Pain Descriptors / Indicators: Grimacing, Guarding Pain Intervention(s): Premedicated before session, Monitored during session    Home Living                          Prior Function            PT Goals (current goals can now be found in the care plan section) Acute Rehab PT Goals Patient Stated Goal: to go home PT Goal Formulation: With patient Time For Goal Achievement: 03/03/23 Potential to Achieve Goals: Good Progress towards PT goals: Progressing toward goals    Frequency    Min 1X/week      PT Plan      Co-evaluation              AM-PAC PT "6 Clicks" Mobility   Outcome Measure  Help needed turning from your back to your side while in a flat bed without using bedrails?: A Lot Help needed moving from lying on your back to sitting on the side of a flat bed without using bedrails?: A Lot Help needed moving to and from a bed to a chair (including a wheelchair)?: A Lot Help needed standing up from a chair using your arms (e.g., wheelchair or bedside chair)?: A Lot Help needed to walk in hospital room?: A Lot Help needed climbing 3-5 steps with a railing? : Total 6 Click Score: 11    End of Session Equipment Utilized During Treatment: Gait belt Activity Tolerance: Patient tolerated treatment well Patient left: in chair;with call bell/phone within reach;with chair alarm set;with family/visitor present Nurse Communication: Mobility status PT Visit Diagnosis: Unsteadiness on feet (R26.81);Muscle weakness (generalized) (M62.81);Pain Pain - Right/Left: Right Pain - part of body: Leg     Time: 1610-9604 PT Time Calculation (min) (ACUTE ONLY): 18 min  Charges:    $Gait Training: 8-22 mins PT General Charges $$ ACUTE PT VISIT: 1 Visit                     Van Clines, PT  Acute Rehabilitation Services Office  [phone] Secure Chat welcomed    Justin [name] 02/18/2023, 11:02 AM

## 2023-02-18 NOTE — Progress Notes (Signed)
Leaving note here for Physical Therapy and Dialysis RN: Patient would like to try to co-ordinate PT and dialysis so he's not too tired from being dialyzed to participate in PT. If dialysis could happen tonight that would be optimal, if not patient and family have verbalized understanding that patient will go do dialysis whenever it's available and therapy will work around that as best they can.Thank you!

## 2023-02-18 NOTE — Progress Notes (Signed)
[facility] Progress Note   Subjective:    Seen and examined patient at bedside. Patient is sitting up in the recliner.  S/p right hip cephalomedullary nail placement by Dr. Hulda Humphrey yesterday. Last renal labs were stable. He denies SOB and CP. Next HD 2/17-plan to work around PT. Patient prefers to get his dialysis done after working with PT.  Objective Vitals:   02/17/23 1751 02/17/23 1937 02/18/23 0404 02/18/23 0736  BP: (!) 133/58 (!) 140/60 (!) 144/54 (!) 146/53  Pulse: 65 67 64 68  Resp: 14 17 16 16   Temp: 98.1 F (36.7 C) 99 F (37.2 C) 98.3 F (36.8 C) 98.6 F (37 C)  TempSrc: Oral Oral Oral Oral  SpO2: 97% 99% 97% 98%  Weight:      Height:       Physical Exam General: Awake, alert, NAD, on RA Heart: S1 and S2; No murmurs, gallops, or rales Lungs: Clear throughout. Unlabored breathing Abdomen: Soft and non-tender Extremities: No LE edema; Dressing R hip CDI Dialysis Access: L AVF (+) B/T   Filed Weights   02/16/23 1812 02/17/23 0981  Weight: 81.6 kg 81.6 kg    Intake/Output Summary (Last 24 hours) at 02/18/2023 1115 Last data filed at 02/18/2023 0413 Gross per 24 hour  Intake 240 ml  Output 525 ml  Net -285 ml    Additional Objective Labs: Basic Metabolic Panel: Recent Labs  Lab 02/16/23 1813 02/17/23 0559  NA 130* 133*  K 3.8 3.3*  CL 89* 94*  CO2 24 26  GLUCOSE 535* 214*  BUN 30* 34*  CREATININE 6.87* 7.83*  CALCIUM 8.9 8.5*  PHOS  --  4.3   Liver Function Tests: Recent Labs  Lab 02/17/23 0559  ALBUMIN 3.1*   No results for input(s): "LIPASE", "AMYLASE" in the last 168 hours. CBC: Recent Labs  Lab 02/16/23 1813 02/17/23 0552 02/18/23 0903  WBC 8.3 9.5 10.9*  NEUTROABS 6.1  --  8.9*  HGB 10.3* 8.6* 7.1*  HCT 28.5* 23.9* 19.5*  MCV 87.7 86.3 85.9  PLT 190 171 152   Blood Culture    Component Value Date/Time   SDES BLOOD RIGHT ARM 06/05/2022 1759   SPECREQUEST  06/05/2022 1759    BOTTLES DRAWN AEROBIC AND ANAEROBIC  Blood Culture results may not be optimal due to an excessive volume of blood received in culture bottles   CULT  06/05/2022 1759    NO GROWTH 5 DAYS Performed at [facility], [address] [place], [place] [address]    REPTSTATUS 06/10/2022 FINAL 06/05/2022 1759    Cardiac Enzymes: No results for input(s): "CKTOTAL", "CKMB", "CKMBINDEX", "TROPONINI" in the last 168 hours. CBG: Recent Labs  Lab 02/17/23 1752 02/17/23 2052 02/17/23 2359 02/18/23 0402 02/18/23 1024  GLUCAP 327* 274* 260* 221* 200*   Iron Studies: No results for input(s): "IRON", "TIBC", "TRANSFERRIN", "FERRITIN" in the last 72 hours. Lab Results  Component Value Date   INR 1.1 02/16/2023   INR 1.3 (H) 04/29/2018   Studies/Results: DG HIP UNILAT WITH PELVIS 2-3 VIEWS RIGHT Result Date: 02/17/2023 CLINICAL DATA:  Elective surgery. EXAM: DG HIP (WITH OR WITHOUT PELVIS) 2-3V RIGHT COMPARISON:  Preoperative imaging FINDINGS: Six fluoroscopic spot views of the right hip and femur submitted from the operating room. Femoral intramedullary nail with trans trochanteric and distal locking screw fixation traverse proximal femur fracture. Fluoroscopy time 1 minutes 27 seconds. Dose 13.22 mGy. IMPRESSION: Intraoperative fluoroscopy during proximal femur fracture fixation. Electronically Signed   By:  Narda Rutherford M.D.   On: 02/17/2023 10:44   DG C-Arm 1-60 Min-No Report Result Date: 02/17/2023 Fluoroscopy was utilized by the requesting physician.  No radiographic interpretation.   DG C-Arm 1-60 Min-No Report Result Date: 02/17/2023 Fluoroscopy was utilized by the requesting physician.  No radiographic interpretation.   Chest Portable 1 View Result Date: 02/16/2023 CLINICAL DATA:  Preop exam.  Fall.  Right femur fracture. EXAM: PORTABLE CHEST 1 VIEW COMPARISON:  Radiograph 06/05/2022 FINDINGS: Similar cardiomegaly. Unchanged mediastinal contours. Aortic atherosclerosis. Left lung base and costophrenic angle are  excluded from the field of view. No pulmonary edema or pneumothorax. No evidence of focal airspace disease. No large pleural effusion. Left axillary stent is partially included. On limited assessment, no acute osseous findings. IMPRESSION: Chronic cardiomegaly.  No acute chest findings. Electronically Signed   By: Narda Rutherford M.D.   On: 02/16/2023 22:48   CT Head Wo Contrast Result Date: 02/16/2023 CLINICAL DATA:  fall, distracting injury EXAM: CT HEAD WITHOUT CONTRAST CT CERVICAL SPINE WITHOUT CONTRAST TECHNIQUE: Multidetector CT imaging of the head and cervical spine was performed following the standard protocol without intravenous contrast. Multiplanar CT image reconstructions of the cervical spine were also generated. RADIATION DOSE REDUCTION: This exam was performed according to the departmental dose-optimization program which includes automated exposure control, adjustment of the mA and/or kV according to patient size and/or use of iterative reconstruction technique. COMPARISON:  None Available. FINDINGS: CT HEAD FINDINGS Brain: Cerebral ventricle sizes are concordant with the degree of cerebral volume loss. Patchy and confluent areas of decreased attenuation are noted throughout the deep and periventricular white matter of the cerebral hemispheres bilaterally, compatible with chronic microvascular ischemic disease. Right temporal encephalomalacia. No evidence of large-territorial acute infarction. No parenchymal hemorrhage. No mass lesion. No extra-axial collection. No mass effect or midline shift. No hydrocephalus. Basilar cisterns are patent. Vascular: No hyperdense vessel. Atherosclerotic calcifications are present within the cavernous internal carotid and vertebral arteries. Skull: No acute fracture or focal lesion.  Prior right burr hole. Sinuses/Orbits: Paranasal sinuses and mastoid air cells are clear. The orbits are unremarkable. Other: None. CT CERVICAL SPINE FINDINGS Alignment: Normal. Skull  base and vertebrae: Multilevel degenerative changes of the spine most prominent at the C5-C6 level with associated posterior disc osteophyte complex formation. No severe osseous foraminal or central canal stenosis. No acute fracture. No aggressive appearing focal osseous lesion or focal pathologic process. Soft tissues and spinal canal: No prevertebral fluid or swelling. No visible canal hematoma. Upper chest: Unremarkable. Other: Left neck 3.1 x 1.8 cm fluid dense lesion likely a sebaceous cyst. IMPRESSION: 1. No acute intracranial abnormality. 2. No acute displaced fracture or traumatic listhesis of the cervical spine. Electronically Signed   By: Tish Frederickson M.D.   On: 02/16/2023 20:21   CT Cervical Spine Wo Contrast Result Date: 02/16/2023 CLINICAL DATA:  fall, distracting injury EXAM: CT HEAD WITHOUT CONTRAST CT CERVICAL SPINE WITHOUT CONTRAST TECHNIQUE: Multidetector CT imaging of the head and cervical spine was performed following the standard protocol without intravenous contrast. Multiplanar CT image reconstructions of the cervical spine were also generated. RADIATION DOSE REDUCTION: This exam was performed according to the departmental dose-optimization program which includes automated exposure control, adjustment of the mA and/or kV according to patient size and/or use of iterative reconstruction technique. COMPARISON:  None Available. FINDINGS: CT HEAD FINDINGS Brain: Cerebral ventricle sizes are concordant with the degree of cerebral volume loss. Patchy and confluent areas of decreased attenuation are noted throughout the deep and  periventricular white matter of the cerebral hemispheres bilaterally, compatible with chronic microvascular ischemic disease. Right temporal encephalomalacia. No evidence of large-territorial acute infarction. No parenchymal hemorrhage. No mass lesion. No extra-axial collection. No mass effect or midline shift. No hydrocephalus. Basilar cisterns are patent. Vascular: No  hyperdense vessel. Atherosclerotic calcifications are present within the cavernous internal carotid and vertebral arteries. Skull: No acute fracture or focal lesion.  Prior right burr hole. Sinuses/Orbits: Paranasal sinuses and mastoid air cells are clear. The orbits are unremarkable. Other: None. CT CERVICAL SPINE FINDINGS Alignment: Normal. Skull base and vertebrae: Multilevel degenerative changes of the spine most prominent at the C5-C6 level with associated posterior disc osteophyte complex formation. No severe osseous foraminal or central canal stenosis. No acute fracture. No aggressive appearing focal osseous lesion or focal pathologic process. Soft tissues and spinal canal: No prevertebral fluid or swelling. No visible canal hematoma. Upper chest: Unremarkable. Other: Left neck 3.1 x 1.8 cm fluid dense lesion likely a sebaceous cyst. IMPRESSION: 1. No acute intracranial abnormality. 2. No acute displaced fracture or traumatic listhesis of the cervical spine. Electronically Signed   By: Tish Frederickson M.D.   On: 02/16/2023 20:21   DG Femur Min 2 Views Right Result Date: 02/16/2023 CLINICAL DATA:  Pain after fall.  Right hip pain. EXAM: PELVIS - 1-2 VIEW; RIGHT FEMUR 2 VIEWS COMPARISON:  None Available. FINDINGS: Pelvis: Comminuted displaced intertrochanteric right proximal femur fracture. Femoral head is well seated, no hip dislocation. No additional fracture of the pelvis. The iliac crests are excluded from the field of view. Pubic rami are intact. Pubic symphysis and sacroiliac joints are congruent. Femur: Distal femur is intact. No additional distal femur fracture. Moderate right knee osteoarthritis. Vascular calcifications are seen. IMPRESSION: Comminuted displaced intertrochanteric right proximal femur fracture. Electronically Signed   By: Narda Rutherford M.D.   On: 02/16/2023 20:08   DG Pelvis 1-2 Views Result Date: 02/16/2023 CLINICAL DATA:  Pain after fall.  Right hip pain. EXAM: PELVIS - 1-2  VIEW; RIGHT FEMUR 2 VIEWS COMPARISON:  None Available. FINDINGS: Pelvis: Comminuted displaced intertrochanteric right proximal femur fracture. Femoral head is well seated, no hip dislocation. No additional fracture of the pelvis. The iliac crests are excluded from the field of view. Pubic rami are intact. Pubic symphysis and sacroiliac joints are congruent. Femur: Distal femur is intact. No additional distal femur fracture. Moderate right knee osteoarthritis. Vascular calcifications are seen. IMPRESSION: Comminuted displaced intertrochanteric right proximal femur fracture. Electronically Signed   By: Narda Rutherford M.D.   On: 02/16/2023 20:08    Medications:   amLODipine  10 mg Oral Daily   feeding supplement (NEPRO CARB STEADY)  237 mL Oral TID BM   insulin aspart  0-9 Units Subcutaneous Q4H   insulin glargine-yfgn  10 Units Subcutaneous QHS   isosorbide mononitrate  30 mg Oral Once per day on Sunday Tuesday Thursday Saturday   metoprolol succinate  50 mg Oral Daily   multivitamin  1 tablet Oral QHS   mupirocin ointment  1 Application Nasal BID   rosuvastatin  40 mg Oral Daily    Dialysis Orders:  TTS [place] 4h  B450  81.1kg   2K bath  L AVF  Heparin none  - last HD 2/13, post wt 81.6kg, gets to dry wt, wt gain ~ 3kg - hectorol 2 mcg IV  - no esa - last Hb 10.5, ferritin 1605, pth 222   Renal-related home meds: - ferric citrate 3 ac tid - hydralazine 50 tid  -  norvasc 10 - metoprolol xl 50 every day - others: imdur, sl ntg prn, statin, novolin insulin, asa  Assessment/Plan: R hip fracture - after a fall. S/P medullary nail procedure done today 2/15 by orthopedic surgery. PT now working with patient. ESRD - on HD TTS. Pt is stable, no vol or lab issues. HD was not done yesterday d/t high patient census. Next HD 2/17. Patient prefers to have his treatment to be done in the afternoon so he's not worn out when working with PT. Will try to accommodate this but explained this is not  guaranteed. I discussed with PT. HTN - BP's currently acceptable. Continue current medications Volume - Euvolemic on exam. Plan to run even with HD tomorrow. Anemia of esrd - Hb 10 > 8.3. Not on esa at OP unit, lats Hb 10.5. transfuse prn.  Secondary hyperparathyroidism - Ca and phos in range but alb is low, on Nepro. Cont binders w/ meals and IV vdra.   Salome Holmes, NP [facility] 02/18/2023,11:15 AM  LOS: 2 days

## 2023-02-18 NOTE — Progress Notes (Signed)
.  Subjective: 1 Day Post-Op Procedure(s) (LRB): INTRAMEDULLARY (IM) NAIL INTERTROCHANTERIC (Right)  Doing well. No events overnight. Pain well controlled. Able to get to chair and walk to window with PT  Activity level:  WBAT RLE Diet tolerance:  as tolerated Patient reports pain as 3 on 0-10 scale.    Objective: Vital signs in last 24 hours: Temp:  [98.1 F (36.7 C)-99 F (37.2 C)] 98.6 F (37 C) (02/16 0736) Pulse Rate:  [64-68] 68 (02/16 0736) Resp:  [14-17] 16 (02/16 0736) BP: (133-146)/(53-60) 146/53 (02/16 0736) SpO2:  [97 %-99 %] 98 % (02/16 0736)  Labs: Recent Labs    02/16/23 1813 02/17/23 0552 02/18/23 0903  HGB 10.3* 8.6* 7.1*   Recent Labs    02/17/23 0552 02/18/23 0903  WBC 9.5 10.9*  RBC 2.77* 2.27*  HCT 23.9* 19.5*  PLT 171 152   Recent Labs    02/16/23 1813 02/17/23 0559  NA 130* 133*  K 3.8 3.3*  CL 89* 94*  CO2 24 26  BUN 30* 34*  CREATININE 6.87* 7.83*  GLUCOSE 535* 214*  CALCIUM 8.9 8.5*   Recent Labs    02/16/23 1813  INR 1.1    Physical Exam:  Neurologically intact Neurovascular intact Sensation intact distally Intact pulses distally Dorsiflexion/Plantar flexion intact Incision: dressing C/D/I Compartment soft  Assessment/Plan:  1 Day Post-Op Procedure(s) (LRB): INTRAMEDULLARY (IM) NAIL INTERTROCHANTERIC (Right)  WBAT RLE PT/OT ain control with PRN pain medication preferring oral medicines.  DVT prophylaxis: Recommend HSQ until mobilizing and then consider transition to aspirin 81mg  BID once better mobilization. Rest of medical management/dialysis per primary   Luci Bank 02/18/2023, 10:27 AM

## 2023-02-18 NOTE — Progress Notes (Signed)
PROGRESS NOTE    Justin [name]  WUJ:[id number] DOB: 1950/04/06 DOA: 02/16/2023 PCP: Adrian Prince, MD    Brief Narrative:  73 year old with ESRD on TTS dialysis, CAD, type 2 diabetes on insulin, hypertension hyperlipidemia who was working at home, lost his balance and fell back landing on the ground, right hip pain.  In the emergency room hemodynamically stable.  Found to have right hip fracture.  Subjective: Patient seen and examined .  Pain is controlled.  Not going to dialysis yet.  Hemoglobin 7.1.  He is anticipating to go home tomorrow if he feels better. Assessment & Plan:   Close traumatic right intertrochanteric hip fracture: Status post ORIF, IM nail right femur. Dr Hulda Humphrey 2/15 Weightbearing as tolerated Adequate pain control IV and oral opiates Continue to work with PT OT. DVT prophylaxis, SCDs today.  Ortho suggested aspirin on discharge.  ESRD on hemodialysis: Last dialysis 2/13.  Nephrology notified for dialysis need.  Type 2 diabetes with hyperglycemia: Blood sugars elevated.  Increase dose of Lantus to 12 units nightly, start prandial insulin and sliding scale insulin.  Anemia of chronic disease, acute on chronic anemia: Hemoglobin 7.1.  Recheck tomorrow morning.  May need transfusion if less than 7 and will arrange with hemodialysis.  Chronic medical issues including Coronary artery disease, stable and currently on aspirin, Toprol-XL, Imdur and statin. Hypertension, as above.  Stable. Hyperlipidemia, on rosuvastatin.   DVT prophylaxis: SCDs Start: 02/16/23 2147   Code Status: Full code Family Communication: None today. Disposition Plan: Status is: Inpatient Remains inpatient appropriate because: Immediate postop     Consultants:  Orthopedics  Procedures:  Right femoral IM nailing  Antimicrobials:  Perioperative     Objective: Vitals:   02/17/23 1751 02/17/23 1937 02/18/23 0404 02/18/23 0736  BP: (!) 133/58 (!) 140/60 (!) 144/54 (!) 146/53   Pulse: 65 67 64 68  Resp: 14 17 16 16   Temp: 98.1 F (36.7 C) 99 F (37.2 C) 98.3 F (36.8 C) 98.6 F (37 C)  TempSrc: Oral Oral Oral Oral  SpO2: 97% 99% 97% 98%  Weight:      Height:        Intake/Output Summary (Last 24 hours) at 02/18/2023 1302 Last data filed at 02/18/2023 0413 Gross per 24 hour  Intake 240 ml  Output 525 ml  Net -285 ml   Filed Weights   02/16/23 1812 02/17/23 0633  Weight: 81.6 kg 81.6 kg    Examination:  Physical Exam Constitutional:      Appearance: Normal appearance.  HENT:     Head: Normocephalic.  Cardiovascular:     Rate and Rhythm: Regular rhythm.     Heart sounds: Normal heart sounds.  Pulmonary:     Breath sounds: Normal breath sounds.  Abdominal:     Palpations: Abdomen is soft.  Musculoskeletal:     Comments: Left UE AV fistula   Skin:    Comments: Post op dressing intact   Neurological:     Mental Status: He is alert.        Data Reviewed: I have personally reviewed following labs and imaging studies  CBC: Recent Labs  Lab 02/16/23 1813 02/17/23 0552 02/18/23 0903  WBC 8.3 9.5 10.9*  NEUTROABS 6.1  --  8.9*  HGB 10.3* 8.6* 7.1*  HCT 28.5* 23.9* 19.5*  MCV 87.7 86.3 85.9  PLT 190 171 152   Basic Metabolic Panel: Recent Labs  Lab 02/16/23 1813 02/17/23 0559  NA 130* 133*  K 3.8 3.3*  CL 89* 94*  CO2 24 26  GLUCOSE 535* 214*  BUN 30* 34*  CREATININE 6.87* 7.83*  CALCIUM 8.9 8.5*  PHOS  --  4.3   GFR: Estimated Creatinine Clearance: 8.5 mL/min (A) (by C-G formula based on SCr of 7.83 mg/dL (H)). Liver Function Tests: Recent Labs  Lab 02/17/23 0559  ALBUMIN 3.1*   No results for input(s): "LIPASE", "AMYLASE" in the last 168 hours. No results for input(s): "AMMONIA" in the last 168 hours. Coagulation Profile: Recent Labs  Lab 02/16/23 1813  INR 1.1   Cardiac Enzymes: No results for input(s): "CKTOTAL", "CKMB", "CKMBINDEX", "TROPONINI" in the last 168 hours. BNP (last 3 results) No results  for input(s): "PROBNP" in the last 8760 hours. HbA1C: Recent Labs    02/17/23 0559  HGBA1C 8.3*   CBG: Recent Labs  Lab 02/17/23 2052 02/17/23 2359 02/18/23 0402 02/18/23 1024 02/18/23 1254  GLUCAP 274* 260* 221* 200* 227*   Lipid Profile: No results for input(s): "CHOL", "HDL", "LDLCALC", "TRIG", "CHOLHDL", "LDLDIRECT" in the last 72 hours. Thyroid Function Tests: No results for input(s): "TSH", "T4TOTAL", "FREET4", "T3FREE", "THYROIDAB" in the last 72 hours. Anemia Panel: No results for input(s): "VITAMINB12", "FOLATE", "FERRITIN", "TIBC", "IRON", "RETICCTPCT" in the last 72 hours. Sepsis Labs: No results for input(s): "PROCALCITON", "LATICACIDVEN" in the last 168 hours.  Recent Results (from the past 240 hours)  Surgical PCR screen     Status: None   Collection Time: 02/17/23  2:45 AM   Specimen: Nasal Mucosa; Nasal Swab  Result Value Ref Range Status   MRSA, PCR NEGATIVE NEGATIVE Final   Staphylococcus aureus NEGATIVE NEGATIVE Final    Comment: (NOTE) The Xpert SA Assay (FDA approved for NASAL specimens in patients 86 years of age and older), is one component of a comprehensive surveillance program. It is not intended to diagnose infection nor to guide or monitor treatment. Performed at [facility], [address], [place], [place] [address]          Radiology Studies: DG HIP UNILAT WITH PELVIS 2-3 VIEWS RIGHT Result Date: 02/17/2023 CLINICAL DATA:  Elective surgery. EXAM: DG HIP (WITH OR WITHOUT PELVIS) 2-3V RIGHT COMPARISON:  Preoperative imaging FINDINGS: Six fluoroscopic spot views of the right hip and femur submitted from the operating room. Femoral intramedullary nail with trans trochanteric and distal locking screw fixation traverse proximal femur fracture. Fluoroscopy time 1 minutes 27 seconds. Dose 13.22 mGy. IMPRESSION: Intraoperative fluoroscopy during proximal femur fracture fixation. Electronically Signed   By: Narda Rutherford M.D.   On:  02/17/2023 10:44   DG C-Arm 1-60 Min-No Report Result Date: 02/17/2023 Fluoroscopy was utilized by the requesting physician.  No radiographic interpretation.   DG C-Arm 1-60 Min-No Report Result Date: 02/17/2023 Fluoroscopy was utilized by the requesting physician.  No radiographic interpretation.   Chest Portable 1 View Result Date: 02/16/2023 CLINICAL DATA:  Preop exam.  Fall.  Right femur fracture. EXAM: PORTABLE CHEST 1 VIEW COMPARISON:  Radiograph 06/05/2022 FINDINGS: Similar cardiomegaly. Unchanged mediastinal contours. Aortic atherosclerosis. Left lung base and costophrenic angle are excluded from the field of view. No pulmonary edema or pneumothorax. No evidence of focal airspace disease. No large pleural effusion. Left axillary stent is partially included. On limited assessment, no acute osseous findings. IMPRESSION: Chronic cardiomegaly.  No acute chest findings. Electronically Signed   By: Narda Rutherford M.D.   On: 02/16/2023 22:48   CT Head Wo Contrast Result Date: 02/16/2023 CLINICAL DATA:  fall, distracting injury EXAM: CT HEAD WITHOUT  CONTRAST CT CERVICAL SPINE WITHOUT CONTRAST TECHNIQUE: Multidetector CT imaging of the head and cervical spine was performed following the standard protocol without intravenous contrast. Multiplanar CT image reconstructions of the cervical spine were also generated. RADIATION DOSE REDUCTION: This exam was performed according to the departmental dose-optimization program which includes automated exposure control, adjustment of the mA and/or kV according to patient size and/or use of iterative reconstruction technique. COMPARISON:  None Available. FINDINGS: CT HEAD FINDINGS Brain: Cerebral ventricle sizes are concordant with the degree of cerebral volume loss. Patchy and confluent areas of decreased attenuation are noted throughout the deep and periventricular white matter of the cerebral hemispheres bilaterally, compatible with chronic microvascular ischemic  disease. Right temporal encephalomalacia. No evidence of large-territorial acute infarction. No parenchymal hemorrhage. No mass lesion. No extra-axial collection. No mass effect or midline shift. No hydrocephalus. Basilar cisterns are patent. Vascular: No hyperdense vessel. Atherosclerotic calcifications are present within the cavernous internal carotid and vertebral arteries. Skull: No acute fracture or focal lesion.  Prior right burr hole. Sinuses/Orbits: Paranasal sinuses and mastoid air cells are clear. The orbits are unremarkable. Other: None. CT CERVICAL SPINE FINDINGS Alignment: Normal. Skull base and vertebrae: Multilevel degenerative changes of the spine most prominent at the C5-C6 level with associated posterior disc osteophyte complex formation. No severe osseous foraminal or central canal stenosis. No acute fracture. No aggressive appearing focal osseous lesion or focal pathologic process. Soft tissues and spinal canal: No prevertebral fluid or swelling. No visible canal hematoma. Upper chest: Unremarkable. Other: Left neck 3.1 x 1.8 cm fluid dense lesion likely a sebaceous cyst. IMPRESSION: 1. No acute intracranial abnormality. 2. No acute displaced fracture or traumatic listhesis of the cervical spine. Electronically Signed   By: Tish Frederickson M.D.   On: 02/16/2023 20:21   CT Cervical Spine Wo Contrast Result Date: 02/16/2023 CLINICAL DATA:  fall, distracting injury EXAM: CT HEAD WITHOUT CONTRAST CT CERVICAL SPINE WITHOUT CONTRAST TECHNIQUE: Multidetector CT imaging of the head and cervical spine was performed following the standard protocol without intravenous contrast. Multiplanar CT image reconstructions of the cervical spine were also generated. RADIATION DOSE REDUCTION: This exam was performed according to the departmental dose-optimization program which includes automated exposure control, adjustment of the mA and/or kV according to patient size and/or use of iterative reconstruction  technique. COMPARISON:  None Available. FINDINGS: CT HEAD FINDINGS Brain: Cerebral ventricle sizes are concordant with the degree of cerebral volume loss. Patchy and confluent areas of decreased attenuation are noted throughout the deep and periventricular white matter of the cerebral hemispheres bilaterally, compatible with chronic microvascular ischemic disease. Right temporal encephalomalacia. No evidence of large-territorial acute infarction. No parenchymal hemorrhage. No mass lesion. No extra-axial collection. No mass effect or midline shift. No hydrocephalus. Basilar cisterns are patent. Vascular: No hyperdense vessel. Atherosclerotic calcifications are present within the cavernous internal carotid and vertebral arteries. Skull: No acute fracture or focal lesion.  Prior right burr hole. Sinuses/Orbits: Paranasal sinuses and mastoid air cells are clear. The orbits are unremarkable. Other: None. CT CERVICAL SPINE FINDINGS Alignment: Normal. Skull base and vertebrae: Multilevel degenerative changes of the spine most prominent at the C5-C6 level with associated posterior disc osteophyte complex formation. No severe osseous foraminal or central canal stenosis. No acute fracture. No aggressive appearing focal osseous lesion or focal pathologic process. Soft tissues and spinal canal: No prevertebral fluid or swelling. No visible canal hematoma. Upper chest: Unremarkable. Other: Left neck 3.1 x 1.8 cm fluid dense lesion likely a sebaceous cyst. IMPRESSION: 1.  No acute intracranial abnormality. 2. No acute displaced fracture or traumatic listhesis of the cervical spine. Electronically Signed   By: Tish Frederickson M.D.   On: 02/16/2023 20:21   DG Femur Min 2 Views Right Result Date: 02/16/2023 CLINICAL DATA:  Pain after fall.  Right hip pain. EXAM: PELVIS - 1-2 VIEW; RIGHT FEMUR 2 VIEWS COMPARISON:  None Available. FINDINGS: Pelvis: Comminuted displaced intertrochanteric right proximal femur fracture. Femoral head is  well seated, no hip dislocation. No additional fracture of the pelvis. The iliac crests are excluded from the field of view. Pubic rami are intact. Pubic symphysis and sacroiliac joints are congruent. Femur: Distal femur is intact. No additional distal femur fracture. Moderate right knee osteoarthritis. Vascular calcifications are seen. IMPRESSION: Comminuted displaced intertrochanteric right proximal femur fracture. Electronically Signed   By: Narda Rutherford M.D.   On: 02/16/2023 20:08   DG Pelvis 1-2 Views Result Date: 02/16/2023 CLINICAL DATA:  Pain after fall.  Right hip pain. EXAM: PELVIS - 1-2 VIEW; RIGHT FEMUR 2 VIEWS COMPARISON:  None Available. FINDINGS: Pelvis: Comminuted displaced intertrochanteric right proximal femur fracture. Femoral head is well seated, no hip dislocation. No additional fracture of the pelvis. The iliac crests are excluded from the field of view. Pubic rami are intact. Pubic symphysis and sacroiliac joints are congruent. Femur: Distal femur is intact. No additional distal femur fracture. Moderate right knee osteoarthritis. Vascular calcifications are seen. IMPRESSION: Comminuted displaced intertrochanteric right proximal femur fracture. Electronically Signed   By: Narda Rutherford M.D.   On: 02/16/2023 20:08        Scheduled Meds:  amLODipine  10 mg Oral Daily   [START ON 02/19/2023] Chlorhexidine Gluconate Cloth  6 each Topical Q0600   feeding supplement (NEPRO CARB STEADY)  237 mL Oral TID BM   insulin aspart  0-5 Units Subcutaneous QHS   insulin aspart  0-6 Units Subcutaneous TID WC   insulin aspart  3 Units Subcutaneous TID WC   insulin glargine-yfgn  12 Units Subcutaneous QHS   isosorbide mononitrate  30 mg Oral Once per day on Sunday Tuesday Thursday Saturday   metoprolol succinate  50 mg Oral Daily   multivitamin  1 tablet Oral QHS   mupirocin ointment  1 Application Nasal BID   rosuvastatin  40 mg Oral Daily   Continuous Infusions:     LOS: 2 days     Time spent: 35 minutes    Dorcas Carrow, MD [facility]

## 2023-02-18 NOTE — Inpatient Diabetes Management (Signed)
Inpatient Diabetes Program Recommendations  AACE/ADA: New Consensus Statement on Inpatient Glycemic Control (2015)  Target Ranges:  Prepandial:   less than 140 mg/dL      Peak postprandial:   less than 180 mg/dL (1-2 hours)      Critically ill patients:  140 - 180 mg/dL   Lab Results  Component Value Date   GLUCAP 200 (H) 02/18/2023   HGBA1C 8.3 (H) 02/17/2023    Review of Glycemic Control  Latest Reference Range & Units 02/17/23 11:53 02/17/23 17:52 02/17/23 20:52 02/17/23 23:59 02/18/23 04:02 02/18/23 10:24  Glucose-Capillary 70 - 99 mg/dL 147 (H) 829 (H) 562 (H) 260 (H) 221 (H) 200 (H)  (H): Data is abnormally high  Diabetes history: DM2 Outpatient Diabetes medications: 70/30 20 units QD Current orders for Inpatient glycemic control: Semglee 10 units every day, Novolog 0-9 units Q4H  Inpatient Diabetes Program Recommendations:    Received Decadron 5 mg every day.  Takes 70/30 20 units every day at home.  Please consider:  Novolog 0-6 units TID and 0-5 units at bedtime  (GFR 7) Novolog 3 units TID with meals if he consumes at least 50% Semglee 12 units QHS  Will continue to follow while inpatient.  Thank you, Dulce Sellar, MSN, CDCES Diabetes Coordinator Inpatient Diabetes Program [phone] (team pager from 8a-5p)

## 2023-02-19 ENCOUNTER — Encounter (HOSPITAL_COMMUNITY): Payer: Self-pay | Admitting: Orthopedic Surgery

## 2023-02-19 DIAGNOSIS — S72141K Displaced intertrochanteric fracture of right femur, subsequent encounter for closed fracture with nonunion: Secondary | ICD-10-CM | POA: Diagnosis not present

## 2023-02-19 LAB — CBC WITH DIFFERENTIAL/PLATELET
Abs Immature Granulocytes: 0.09 10*3/uL — ABNORMAL HIGH (ref 0.00–0.07)
Basophils Absolute: 0 10*3/uL (ref 0.0–0.1)
Basophils Relative: 0 %
Eosinophils Absolute: 0.1 10*3/uL (ref 0.0–0.5)
Eosinophils Relative: 1 %
HCT: 18 % — ABNORMAL LOW (ref 39.0–52.0)
Hemoglobin: 6.4 g/dL — CL (ref 13.0–17.0)
Immature Granulocytes: 1 %
Lymphocytes Relative: 13 %
Lymphs Abs: 1.2 10*3/uL (ref 0.7–4.0)
MCH: 31.7 pg (ref 26.0–34.0)
MCHC: 35.6 g/dL (ref 30.0–36.0)
MCV: 89.1 fL (ref 80.0–100.0)
Monocytes Absolute: 1.1 10*3/uL — ABNORMAL HIGH (ref 0.1–1.0)
Monocytes Relative: 12 %
Neutro Abs: 6.8 10*3/uL (ref 1.7–7.7)
Neutrophils Relative %: 73 %
Platelets: 148 10*3/uL — ABNORMAL LOW (ref 150–400)
RBC: 2.02 MIL/uL — ABNORMAL LOW (ref 4.22–5.81)
RDW: 13.6 % (ref 11.5–15.5)
WBC: 9.4 10*3/uL (ref 4.0–10.5)
nRBC: 0 % (ref 0.0–0.2)

## 2023-02-19 LAB — RENAL FUNCTION PANEL
Albumin: 2.8 g/dL — ABNORMAL LOW (ref 3.5–5.0)
Anion gap: 19 — ABNORMAL HIGH (ref 5–15)
BUN: 79 mg/dL — ABNORMAL HIGH (ref 8–23)
CO2: 24 mmol/L (ref 22–32)
Calcium: 8.2 mg/dL — ABNORMAL LOW (ref 8.9–10.3)
Chloride: 89 mmol/L — ABNORMAL LOW (ref 98–111)
Creatinine, Ser: 11.25 mg/dL — ABNORMAL HIGH (ref 0.61–1.24)
GFR, Estimated: 4 mL/min — ABNORMAL LOW (ref >60)
Glucose, Bld: 371 mg/dL — ABNORMAL HIGH (ref 70–99)
Phosphorus: 7.8 mg/dL — ABNORMAL HIGH (ref 2.5–4.6)
Potassium: 4.4 mmol/L (ref 3.5–5.1)
Sodium: 132 mmol/L — ABNORMAL LOW (ref 135–145)

## 2023-02-19 LAB — GLUCOSE, CAPILLARY
Glucose-Capillary: 356 mg/dL — ABNORMAL HIGH (ref 70–99)
Glucose-Capillary: 281 mg/dL — ABNORMAL HIGH (ref 70–99)
Glucose-Capillary: 209 mg/dL — ABNORMAL HIGH (ref 70–99)
Glucose-Capillary: 287 mg/dL — ABNORMAL HIGH (ref 70–99)

## 2023-02-19 LAB — PREPARE RBC (CROSSMATCH)

## 2023-02-19 MED ORDER — LORAZEPAM 0.5 MG PO TABS
0.5000 mg | ORAL_TABLET | Freq: Four times a day (QID) | ORAL | Status: DC | PRN
  Administered 2023-02-19 – 2023-02-20 (×3): 0.5 mg via ORAL
  Filled 2023-02-19 (×3): qty 1

## 2023-02-19 MED ORDER — SODIUM CHLORIDE 0.9% IV SOLUTION: Freq: Once | INTRAVENOUS | Status: DC

## 2023-02-19 NOTE — Progress Notes (Signed)
Physical Therapy Treatment Patient Details Name: Justin [name] MRN: [id number] DOB: 05-19-1950 Today's Date: 02/19/2023   History of Present Illness 73 year old admitted 2/14 after fall at home with right hip fracture. IM nail right femur 2/14. PMH: ESRD on TTS dialysis, CAD, type 2 diabetes on insulin, hypertension hyperlipidemia.    PT Comments  Pt received in supine with HOB elevated after eating lunch and after further premedication due to pain, pt spouse present. Pt reports pain improved after more meds given and is agreeable to work on transfer training at bedside, but remains limited by pain during bed mobility and transfer trials. Pt needing up to +2 maxA for sit<>stand with RW and x2 shuffled lateral steps toward HOB and up to +2 totalA for bed mobility returning to supine due to pain/fatigue. Pt did report dizziness standing at bedside and has low Hgb, pending blood transfusion later in the day. Disposition updated below after discussion with pt and significant other and supervising PT Dawn M, case mgmt notified. DME also updated in case pt instead decides to go home.   If plan is discharge home, recommend the following: Assistance with cooking/housework;Assist for transportation;Help with stairs or ramp for entrance;Two people to help with walking and/or transfers;A lot of help with bathing/dressing/bathroom   Can travel by private vehicle     No  Equipment Recommendations  BSC/3in1;Wheelchair (measurements PT);Wheelchair cushion (measurements PT);Hospital bed (pt also has transport chair but would need manual WC if home by himself as his spouse works)    Recommendations for [employer]       Precautions / Restrictions Precautions Precautions: Fall Restrictions Weight Bearing Restrictions Per Provider Order: Yes RLE Weight Bearing Per Provider Order: Weight bearing as tolerated Other Position/Activity Restrictions: not able to tolerate full WB due to pain     Mobility  Bed  Mobility Overal bed mobility: Needs Assistance Bed Mobility: Supine to Sit, Sit to Supine     Supine to sit: Used rails, HOB elevated, Max assist, +2 for physical assistance Sit to supine: Total assist, +2 for physical assistance   General bed mobility comments: Pt received in same posture as previous session ended with hips angled toward R EOB so verbal discussion on safe technique to get to EOB and pt ulimately needing +2 maxA to achieve long sitting with BUE support and posterior lean, pt heavily reliant on rails on sides and end of bed, and BLE assist to lower safely to floor, along with +1-2 assist on bed pad to advance his hips.    Transfers Overall transfer level: Needs assistance Equipment used: Rolling walker (2 wheels) Transfers: Sit to/from Stand Sit to Stand: Max assist, +2 physical assistance, From elevated surface           General transfer comment: From elevated hospital bed surface, pt able to stand with +2 maxA but needing manual assist to slide RLE for lateral steps toward HOB x2. Pt not able to perform forward gait away from bed due to c/o dizziness and increasing fatigue, of note pt today with low Hgb and appears somewhat symptomatic of this in standing so defer OOB to chair for pt safety as he will be leaving soon for HD in bed.    Ambulation/Gait               General Gait Details: Pt not able to take forward steps, x2 shuffled steps toward [facility] with manual assist to slide RLE and minA for RW management and stabiltiy via gait belt (+2  assist) due to posterior lean and pt fatigue.   Stairs             Wheelchair Mobility     Tilt Bed    Modified Rankin (Stroke Patients Only)       Balance Overall balance assessment: Needs assistance Sitting-balance support: Feet supported, Bilateral upper extremity supported Sitting balance-Leahy Scale: Poor Sitting balance - Comments: reliant on BUE support due to pain and fatigue with Supervision, min to  modA when unsupported sitting. Postural control: Posterior lean Standing balance support: Bilateral upper extremity supported, Reliant on assistive device for balance Standing balance-Leahy Scale: Poor Standing balance comment: +1 min/modA at RW for static standing, +2 assist for sidesteps along EOB with RW due to pain                            Communication Communication Communication: No apparent difficulties  Cognition Arousal: Alert Behavior During Therapy: Anxious, Flat affect   PT - Cognitive impairments: No apparent impairments, Sequencing, Problem solving, Initiation                       PT - Cognition Comments: Slow to initiate due to pain/fear of pain, dense multimodal cues for sequencing and safety and for pursed-lip breathing as pt tends to hold his breath. Following commands: Intact      Cueing Cueing Techniques: Verbal cues, Tactile cues  Exercises General Exercises - Lower Extremity Ankle Circles/Pumps: AROM, Both, Supine, 10 reps Quad Sets: AROM, Right, 5 reps Heel Slides:  (pt not able to tolerate due to pain) Hip ABduction/ADduction: AAROM, Right, 5 reps, Supine (very small movements with towel under his calf for AA and pt has gait belt to use as a leg lifter) Other Exercises Other Exercises: single leg bridge (partial) x3 attempts, limited carryover Other Exercises: bed crunches x2 with UE support    General Comments General comments (skin integrity, edema, etc.): pt c/o dizziness with standing trial and has low Hgb but not able to stand long enough to check orthostatic BP; plan to check tomorrow if still lightheaded in standing after blood products given.      Pertinent Vitals/Pain Pain Assessment Pain Assessment: Faces Pain Score: 10-Worst pain ever Faces Pain Scale: Hurts whole lot Pain Location: R hip with movement and weight bearing. Pt reports minimal to no resting pain after more pain meds given and eating his lunch, but severe  with transfer training and bed mobility. Pain Descriptors / Indicators: Grimacing, Guarding, Sharp, Moaning, Pounding (screaming) Pain Intervention(s): Limited activity within patient's tolerance, Premedicated before session, Monitored during session, Repositioned, Ice applied    Home Living                          Prior Function            PT Goals (current goals can now be found in the care plan section) Acute Rehab PT Goals Patient Stated Goal: to go home PT Goal Formulation: With patient Time For Goal Achievement: 03/03/23 Progress towards PT goals: Progressing toward goals    Frequency    Min 1X/week      PT Plan      Co-evaluation              AM-PAC PT "6 Clicks" Mobility   Outcome Measure  Help needed turning from your back to your side while in a flat bed without  using bedrails?: A Lot Help needed moving from lying on your back to sitting on the side of a flat bed without using bedrails?: Total (to return to supine in flat bed/no rails) Help needed moving to and from a bed to a chair (including a wheelchair)?: Total (+2 assist and can't move his R leg without assist) Help needed standing up from a chair using your arms (e.g., wheelchair or bedside chair)?: Total Help needed to walk in hospital room?: Total Help needed climbing 3-5 steps with a railing? : Total 6 Click Score: 7    End of Session Equipment Utilized During Treatment: Gait belt Activity Tolerance: Patient limited by pain Patient left: in bed;with call bell/phone within reach;with family/visitor present;Other (comment) (spouse [name] in room with him, R heel floated, ice pack to R hip/thigh) Nurse Communication: Mobility status;Need for lift equipment;Other (comment) (+2 with Stedy or RW due to pain) PT Visit Diagnosis: Unsteadiness on feet (R26.81);Muscle weakness (generalized) (M62.81);Pain Pain - Right/Left: Right Pain - part of body: Leg     Time: [phone] (also IN at  12:37PM and OUT at 12:43pm to discuss disposition/DME) PT Time Calculation (min) (ACUTE ONLY): 24 min +5 mins (see above)  Charges:    $Therapeutic Activity: 23-37 mins PT General Charges $$ ACUTE PT VISIT: 1 Visit                     Versie Fleener P., PTA Acute Rehabilitation Services Secure Chat Preferred 9a-5:30pm Office: [phone]    Dorathy Kinsman [facility] 02/19/2023, 12:53 PM

## 2023-02-19 NOTE — Progress Notes (Signed)
Received patient in bed to unit.  Alert and oriented.  Informed consent signed and in chart.   TX duration:3   Patient tolerated well.  Transported back to the room  Alert, without acute distress.  Hand-off given to patient's nurse.   Access used: LAVF Access issues: none  Total UF removed: 2L - 1.5net Medication(s) given: none   02/19/23 1658  Vitals  Temp 98.4 F (36.9 C)  BP (!) 135/58  Pulse Rate 65  ECG Heart Rate 66  Resp (!) 22  Oxygen Therapy  SpO2 100 %  During Treatment Monitoring  Blood Flow Rate (mL/min) 399 mL/min  Arterial Pressure (mmHg) -182.41 mmHg  Venous Pressure (mmHg) 201.4 mmHg  TMP (mmHg) 16.16 mmHg  Ultrafiltration Rate (mL/min) 1125 mL/min  Dialysate Flow Rate (mL/min) 299 ml/min  Dialysate Potassium Concentration 3  Dialysate Calcium Concentration 2.5  Duration of HD Treatment -hour(s) 3 hour(s)  Cumulative Fluid Removed (mL) per Treatment  1999.53  HD Safety Checks Performed Yes  Intra-Hemodialysis Comments Tx completed  Dialysis Fluid Bolus Normal Saline  Bolus Amount (mL) 300 mL  Post Treatment  Dialyzer Clearance Lightly streaked  Hemodialysis Intake (mL) 500 mL  Liters Processed 72  Fluid Removed (mL) 2000 mL  Tolerated HD Treatment Yes  Post-Hemodialysis Comments UFG increased to accomodate PRBC vol  AVG/AVF Arterial Site Held (minutes) 5 minutes  AVG/AVF Venous Site Held (minutes) 5 minutes      Freddi Starr, RN [facility]

## 2023-02-19 NOTE — Progress Notes (Signed)
Physical Therapy Treatment Patient Details Name: Justin [name] MRN: [id number] DOB: November 20, 1950 Today's Date: 02/19/2023   History of Present Illness 73 year old admitted 2/14 after fall at home with right hip fracture.  IM nail right femur 2/14.  PMH: ESRD on TTS dialysis, CAD, type 2 diabetes on insulin, hypertension hyperlipidemia.    PT Comments  Pt received in supine ~20 mins after IV pain meds given, pt reporting continued severe RLE pain, despite use of ice and premedication. Pt needing maxA for AAROM to work on movement toward R EOB but ultimately states he is unable to fully reach edge of bed and cannot tolerate heel floating off mattress even with the rest of his limb fully supported by mattress, RN notified. Worked on supine RLE exercise instruction as tolerated with pt/spouse education, she reports they still have handout given in previous session. Pt agreeable to RN giving him more PO meds after session so he can try to mobilize OOB to chair after lunch and before HD session. Pt continues to benefit from PT services to progress toward functional mobility goals, pt not making progress toward goals this date and per pt/spouse has had more pain the past couple days, disposition pending progress acutely.     If plan is discharge home, recommend the following: A little help with bathing/dressing/bathroom;Assistance with cooking/housework;Assist for transportation;Help with stairs or ramp for entrance;A lot of help with walking and/or transfers   Can travel by private vehicle        Equipment Recommendations  BSC/3in1    Recommendations for Other Services       Precautions / Restrictions Precautions Precautions: Fall Restrictions Weight Bearing Restrictions Per Provider Order: Yes RLE Weight Bearing Per Provider Order: Weight bearing as tolerated     Mobility  Bed Mobility Overal bed mobility: Needs Assistance Bed Mobility: Supine to Sit     Supine to sit: Mod assist, Used  rails, HOB elevated (to long sitting briefly)     General bed mobility comments: Step-by-step cues for technique; used half-bridge to position hip towards EOB and towel under his RLE to assist with abducting RLE toward EOB, nearly reaching side of bed but once heel off side of bed, pt insists to return his RLE to mattress; mod handheld assist to pull to long sitting to adjust pillow under his back/neck but pt defers to sit on side of bed due to pain; RN called to room at this time to give more pain meds.    Transfers Overall transfer level: Needs assistance                 General transfer comment: not assessed this session, pain too severe despite IV pain meds given prior to attempt.    Ambulation/Gait                   Stairs             Wheelchair Mobility     Tilt Bed    Modified Rankin (Stroke Patients Only)       Balance Overall balance assessment: Needs assistance Sitting-balance support: Feet unsupported, Single extremity supported Sitting balance-Leahy Scale: Poor Sitting balance - Comments: likely due to hip posture in long sitting, pt not able to tolerate legs off EOB this session to work on dynamic seated tasks. Postural control: Posterior lean     Standing balance comment: pt not able this session.  Communication Communication Communication: No apparent difficulties  Cognition Arousal: Alert Behavior During Therapy: Anxious   PT - Cognitive impairments: No apparent impairments                       PT - Cognition Comments: Pt aware of need to mobilize but premedication was not sufficient for him to tolerate even small amount of mobility (moving his R leg to side of bed and hip/knee flexion/extension) Following commands: Intact      Cueing Cueing Techniques: Verbal cues, Tactile cues  Exercises General Exercises - Lower Extremity Ankle Circles/Pumps: AROM, Both, Supine, 10 reps Quad  Sets: AROM, Right, 5 reps Heel Slides:  (pt not able to tolerate due to pain) Hip ABduction/ADduction: AAROM, Right, 5 reps, Supine (very small movements with towel under his calf for AA and pt has gait belt to use as a leg lifter) Other Exercises Other Exercises: single leg bridge (partial) x3 attempts, limited carryover Other Exercises: bed crunches x2 with UE support    General Comments General comments (skin integrity, edema, etc.): VSS on RA per chart review, no acute s/sx distress other than c/o pain; Of note, pt blood sugar has been persistently high in AM, 281 taken just after session but had been >350 in AM.      Pertinent Vitals/Pain Pain Assessment Pain Assessment: 0-10 Pain Score: 10-Worst pain ever Faces Pain Scale: Hurts worst Pain Location: R hip with movement (even small amounts in the bed) Pain Descriptors / Indicators: Grimacing, Guarding, Sharp, Moaning, Pounding Pain Intervention(s): Limited activity within patient's tolerance, Monitored during session, Premedicated before session, Repositioned, Patient requesting pain meds-RN notified, Ice applied, Utilized relaxation techniques    Home Living                          Prior Function            PT Goals (current goals can now be found in the care plan section) Acute Rehab PT Goals Patient Stated Goal: to go home PT Goal Formulation: With patient Time For Goal Achievement: 03/03/23 Progress towards PT goals: Progressing toward goals    Frequency    Min 1X/week      PT Plan      Co-evaluation              AM-PAC PT "6 Clicks" Mobility   Outcome Measure  Help needed turning from your back to your side while in a flat bed without using bedrails?: A Lot Help needed moving from lying on your back to sitting on the side of a flat bed without using bedrails?: A Lot Help needed moving to and from a bed to a chair (including a wheelchair)?: A Lot Help needed standing up from a chair using  your arms (e.g., wheelchair or bedside chair)?: A Lot Help needed to walk in hospital room?: Total Help needed climbing 3-5 steps with a railing? : Total 6 Click Score: 10    End of Session Equipment Utilized During Treatment: Gait belt (as leg lifter) Activity Tolerance: Patient limited by pain Patient left: in bed;with call bell/phone within reach;with nursing/sitter in room;with family/visitor present (RN arriving to give pain meds, spouse in room, HOB elevated so pt can take PO meds) Nurse Communication: Mobility status;Patient requests pain meds PT Visit Diagnosis: Unsteadiness on feet (R26.81);Muscle weakness (generalized) (M62.81);Pain Pain - Right/Left: Right Pain - part of body: Leg     Time: 1043-1101 PT Time  Calculation (min) (ACUTE ONLY): 18 min  Charges:    $Therapeutic Exercise: 8-22 mins PT General Charges $$ ACUTE PT VISIT: 1 Visit                     Shauntelle Jamerson P., PTA Acute Rehabilitation Services Secure Chat Preferred 9a-5:30pm Office: [phone]    Dorathy Kinsman [facility] 02/19/2023, 11:19 AM

## 2023-02-19 NOTE — Progress Notes (Addendum)
[facility] Progress Note   Subjective: Seen in room. Hasn't been up in chair except when he was first admitted. Denies SOB. Low HGB transfuse during HD today.      Objective Vitals:   02/18/23 1634 02/18/23 1930 02/19/23 0451 02/19/23 0738  BP: (!) 126/59 (!) 120/50 (!) 147/56 (!) 141/62  Pulse: 88 66 69 73  Resp: 17 18 16 17   Temp: 97.8 F (36.6 C) 98 F (36.7 C) 98.2 F (36.8 C) 98 F (36.7 C)  TempSrc: Oral Oral Oral Oral  SpO2: 100% 95% 97% 96%  Weight:      Height:       Physical Exam General: Pleasant older male in NAD Heart:S1,S2 3/6 systolic M. No R/G Lungs: CTAB A/P Abdomen: NABS Extremities:Drsg R hip intact. No LE edema.  Dialysis Access: L AVF + T/B    Additional Objective Labs: Basic Metabolic Panel: Recent Labs  Lab 02/16/23 1813 02/17/23 0559 02/19/23 0533  NA 130* 133* 132*  K 3.8 3.3* 4.4  CL 89* 94* 89*  CO2 24 26 24   GLUCOSE 535* 214* 371*  BUN 30* 34* 79*  CREATININE 6.87* 7.83* 11.25*  CALCIUM 8.9 8.5* 8.2*  PHOS  --  4.3 7.8*   Liver Function Tests: Recent Labs  Lab 02/17/23 0559 02/19/23 0533  ALBUMIN 3.1* 2.8*   No results for input(s): "LIPASE", "AMYLASE" in the last 168 hours. CBC: Recent Labs  Lab 02/16/23 1813 02/17/23 0552 02/18/23 0903 02/19/23 0533  WBC 8.3 9.5 10.9* 9.4  NEUTROABS 6.1  --  8.9* 6.8  HGB 10.3* 8.6* 7.1* 6.4*  HCT 28.5* 23.9* 19.5* 18.0*  MCV 87.7 86.3 85.9 89.1  PLT 190 171 152 148*   Blood Culture    Component Value Date/Time   SDES BLOOD RIGHT ARM 06/05/2022 1759   SPECREQUEST  06/05/2022 1759    BOTTLES DRAWN AEROBIC AND ANAEROBIC Blood Culture results may not be optimal due to an excessive volume of blood received in culture bottles   CULT  06/05/2022 1759    NO GROWTH 5 DAYS Performed at [facility], [address], [place], [place] [address]    REPTSTATUS 06/10/2022 FINAL 06/05/2022 1759    Cardiac Enzymes: No results for input(s): "CKTOTAL", "CKMB",  "CKMBINDEX", "TROPONINI" in the last 168 hours. CBG: Recent Labs  Lab 02/18/23 1024 02/18/23 1254 02/18/23 1629 02/18/23 2132 02/19/23 0619  GLUCAP 200* 227* 247* 287* 356*   Iron Studies: No results for input(s): "IRON", "TIBC", "TRANSFERRIN", "FERRITIN" in the last 72 hours. @lablastinr3 @ Studies/Results: DG HIP UNILAT WITH PELVIS 2-3 VIEWS RIGHT Result Date: 02/17/2023 CLINICAL DATA:  Elective surgery. EXAM: DG HIP (WITH OR WITHOUT PELVIS) 2-3V RIGHT COMPARISON:  Preoperative imaging FINDINGS: Six fluoroscopic spot views of the right hip and femur submitted from the operating room. Femoral intramedullary nail with trans trochanteric and distal locking screw fixation traverse proximal femur fracture. Fluoroscopy time 1 minutes 27 seconds. Dose 13.22 mGy. IMPRESSION: Intraoperative fluoroscopy during proximal femur fracture fixation. Electronically Signed   By: Narda Rutherford M.D.   On: 02/17/2023 10:44   DG C-Arm 1-60 Min-No Report Result Date: 02/17/2023 Fluoroscopy was utilized by the requesting physician.  No radiographic interpretation.   DG C-Arm 1-60 Min-No Report Result Date: 02/17/2023 Fluoroscopy was utilized by the requesting physician.  No radiographic interpretation.   Medications:   sodium chloride   Intravenous Once   amLODipine  10 mg Oral Daily   Chlorhexidine Gluconate Cloth  6 each Topical Q0600  feeding supplement (NEPRO CARB STEADY)  237 mL Oral TID BM   insulin aspart  0-5 Units Subcutaneous QHS   insulin aspart  0-6 Units Subcutaneous TID WC   insulin aspart  3 Units Subcutaneous TID WC   insulin glargine-yfgn  12 Units Subcutaneous QHS   isosorbide mononitrate  30 mg Oral Once per day on Sunday Tuesday Thursday Saturday   metoprolol succinate  50 mg Oral Daily   multivitamin  1 tablet Oral QHS   mupirocin ointment  1 Application Nasal BID   rosuvastatin  40 mg Oral Daily      Dialysis Orders:   TTS [facility] 4h  B450  81.1kg   2K bath  L AVF   Heparin none  - last HD 2/13, post wt 81.6kg, gets to dry wt, wt gain ~ 3kg - hectorol 2 mcg IV  - no esa - last Hb 10.5, ferritin 1605, pth 222    Renal-related home meds: - ferric citrate 3 ac tid - hydralazine 50 tid  - norvasc 10 - metoprolol xl 50 every day - others: imdur, sl ntg prn, statin, novolin insulin, asa   Assessment/Plan: R hip fracture - after a fall. S/P medullary nail procedure done today 2/15 by orthopedic surgery. PT now working with patient. ESRD - on HD TTS. Pt is stable, no vol or lab issues. HD was not done yesterday d/t high patient census. Next HD 2/17 OFF SCHEDULE. Patient prefers to have his treatment to be done in the afternoon so he's not worn out when working with PT. Will try to accommodate this but explained this is not guaranteed. I discussed with PT. HTN - BP's currently acceptable. Continue current medications Volume - Euvolemic on exam. UF as tolerated. Anemia of esrd - hgb 6.4 this AM. Transfuse one unit PRBCs with HD. Check iron panel.  Secondary hyperparathyroidism - Ca and phos in range but alb is low, on Nepro. Cont binders w/ meals and IV vdra.     Jomo Forand H. Ashtan Laton NP-C 02/19/2023, 8:12 AM  [employer] [phone]

## 2023-02-19 NOTE — Progress Notes (Signed)
PROGRESS NOTE    Justin [name]  JXB:[id number] DOB: [birth year] DOA: 02/16/2023 PCP: Adrian Prince, MD    Brief Narrative:  73 year old with ESRD on TTS dialysis, CAD, type 2 diabetes on insulin, hypertension hyperlipidemia who was working at home, lost his balance and fell back landing on the ground, right hip pain.  In the emergency room hemodynamically stable.  Found to have right hip fracture. Underwent ORIF.   Subjective: Patient seen and examined.  Tired of all night intervention.  Blood draws.  Hemoglobin 6.4.  Consented for blood transfusion.  Significant pain on attempted mobility.   Assessment & Plan:   Close traumatic right intertrochanteric hip fracture: Status post ORIF, IM nail right femur. Dr Hulda Humphrey 2/15 Weightbearing as tolerated Adequate pain control IV and oral opiates Continue to work with PT OT. DVT prophylaxis, SCDs today.  Ortho suggested aspirin on discharge.  ESRD on hemodialysis: Last dialysis 2/13.  Nephrology notified for dialysis need. Dialysis office scheduled today.  Type 2 diabetes with hyperglycemia: Blood sugars elevated.  Increase dose of Lantus to 12 units nightly, start prandial insulin and sliding scale insulin.  Anemia of chronic disease, acute on chronic anemia: Hemoglobin 6.4.  1 unit PRBC transfusion today with dialysis.  Chronic medical issues including Coronary artery disease, stable and currently on aspirin, Toprol-XL, Imdur and statin. Hypertension, as above.  Stable. Hyperlipidemia, on rosuvastatin.   DVT prophylaxis: SCDs Start: 02/16/23 2147   Code Status: Full code Family Communication: None today. Disposition Plan: Status is: Inpatient Remains inpatient appropriate because: Immediate postop.  Will need a skilled nursing facility.     Consultants:  Orthopedics Nephrology for dialysis need  Procedures:  Right femoral IM nailing  Antimicrobials:  Perioperative     Objective: Vitals:   02/18/23 1634 02/18/23  1930 02/19/23 0451 02/19/23 0738  BP: (!) 126/59 (!) 120/50 (!) 147/56 (!) 141/62  Pulse: 88 66 69 73  Resp: 17 18 16 17   Temp: 97.8 F (36.6 C) 98 F (36.7 C) 98.2 F (36.8 C) 98 F (36.7 C)  TempSrc: Oral Oral Oral Oral  SpO2: 100% 95% 97% 96%  Weight:      Height:       No intake or output data in the 24 hours ending 02/19/23 1130  Filed Weights   02/16/23 1812 02/17/23 0633  Weight: 81.6 kg 81.6 kg    Examination:  Physical Exam Constitutional:      Appearance: Normal appearance.     Comments: Anxious today.  HENT:     Head: Normocephalic.  Cardiovascular:     Rate and Rhythm: Regular rhythm.     Heart sounds: Normal heart sounds.  Pulmonary:     Breath sounds: Normal breath sounds.  Abdominal:     Palpations: Abdomen is soft.  Musculoskeletal:     Comments: Left UE AV fistula   Skin:    Comments: Post op dressing intact   Neurological:     Mental Status: He is alert.        Data Reviewed: I have personally reviewed following labs and imaging studies  CBC: Recent Labs  Lab 02/16/23 1813 02/17/23 0552 02/18/23 0903 02/19/23 0533  WBC 8.3 9.5 10.9* 9.4  NEUTROABS 6.1  --  8.9* 6.8  HGB 10.3* 8.6* 7.1* 6.4*  HCT 28.5* 23.9* 19.5* 18.0*  MCV 87.7 86.3 85.9 89.1  PLT 190 171 152 148*   Basic Metabolic Panel: Recent Labs  Lab 02/16/23 1813 02/17/23 0559 02/19/23 0533  NA 130* 133*  132*  K 3.8 3.3* 4.4  CL 89* 94* 89*  CO2 24 26 24   GLUCOSE 535* 214* 371*  BUN 30* 34* 79*  CREATININE 6.87* 7.83* 11.25*  CALCIUM 8.9 8.5* 8.2*  PHOS  --  4.3 7.8*   GFR: Estimated Creatinine Clearance: 5.9 mL/min (A) (by C-G formula based on SCr of 11.25 mg/dL (H)). Liver Function Tests: Recent Labs  Lab 02/17/23 0559 02/19/23 0533  ALBUMIN 3.1* 2.8*   No results for input(s): "LIPASE", "AMYLASE" in the last 168 hours. No results for input(s): "AMMONIA" in the last 168 hours. Coagulation Profile: Recent Labs  Lab 02/16/23 1813  INR 1.1    Cardiac Enzymes: No results for input(s): "CKTOTAL", "CKMB", "CKMBINDEX", "TROPONINI" in the last 168 hours. BNP (last 3 results) No results for input(s): "PROBNP" in the last 8760 hours. HbA1C: Recent Labs    02/17/23 0559  HGBA1C 8.3*   CBG: Recent Labs  Lab 02/18/23 1254 02/18/23 1629 02/18/23 2132 02/19/23 0619 02/19/23 1107  GLUCAP 227* 247* 287* 356* 281*   Lipid Profile: No results for input(s): "CHOL", "HDL", "LDLCALC", "TRIG", "CHOLHDL", "LDLDIRECT" in the last 72 hours. Thyroid Function Tests: No results for input(s): "TSH", "T4TOTAL", "FREET4", "T3FREE", "THYROIDAB" in the last 72 hours. Anemia Panel: No results for input(s): "VITAMINB12", "FOLATE", "FERRITIN", "TIBC", "IRON", "RETICCTPCT" in the last 72 hours. Sepsis Labs: No results for input(s): "PROCALCITON", "LATICACIDVEN" in the last 168 hours.  Recent Results (from the past 240 hours)  Surgical PCR screen     Status: None   Collection Time: 02/17/23  2:45 AM   Specimen: Nasal Mucosa; Nasal Swab  Result Value Ref Range Status   MRSA, PCR NEGATIVE NEGATIVE Final   Staphylococcus aureus NEGATIVE NEGATIVE Final    Comment: (NOTE) The Xpert SA Assay (FDA approved for NASAL specimens in patients 22 years of age and older), is one component of a comprehensive surveillance program. It is not intended to diagnose infection nor to guide or monitor treatment. Performed at [facility], [address] [place], [place] [address]          Radiology Studies: No results found.       Scheduled Meds:  sodium chloride   Intravenous Once   amLODipine  10 mg Oral Daily   Chlorhexidine Gluconate Cloth  6 each Topical Q0600   feeding supplement (NEPRO CARB STEADY)  237 mL Oral TID BM   insulin aspart  0-5 Units Subcutaneous QHS   insulin aspart  0-6 Units Subcutaneous TID WC   insulin aspart  3 Units Subcutaneous TID WC   insulin glargine-yfgn  12 Units Subcutaneous QHS   isosorbide mononitrate   30 mg Oral Once per day on Sunday Tuesday Thursday Saturday   metoprolol succinate  50 mg Oral Daily   multivitamin  1 tablet Oral QHS   mupirocin ointment  1 Application Nasal BID   rosuvastatin  40 mg Oral Daily   Continuous Infusions:     LOS: 3 days    Time spent: 35 minutes    Dorcas Carrow, MD [facility]

## 2023-02-20 DIAGNOSIS — S72141K Displaced intertrochanteric fracture of right femur, subsequent encounter for closed fracture with nonunion: Secondary | ICD-10-CM | POA: Diagnosis not present

## 2023-02-20 LAB — TYPE AND SCREEN
ABO/RH(D): O POS
Antibody Screen: NEGATIVE
Unit division: 0

## 2023-02-20 LAB — BPAM RBC
Blood Product Expiration Date: 202502212359
ISSUE DATE / TIME: 202502171400
Unit Type and Rh: 9500

## 2023-02-20 LAB — HEPATITIS B SURFACE ANTIBODY, QUANTITATIVE: Hep B S AB Quant (Post): <3.5 m[IU]/mL — ABNORMAL LOW

## 2023-02-20 LAB — CBC WITH DIFFERENTIAL/PLATELET
Abs Immature Granulocytes: 0.08 10*3/uL — ABNORMAL HIGH (ref 0.00–0.07)
Basophils Absolute: 0 10*3/uL (ref 0.0–0.1)
Basophils Relative: 0 %
Eosinophils Absolute: 0.2 10*3/uL (ref 0.0–0.5)
Eosinophils Relative: 2 %
HCT: 22.8 % — ABNORMAL LOW (ref 39.0–52.0)
Hemoglobin: 8.2 g/dL — ABNORMAL LOW (ref 13.0–17.0)
Immature Granulocytes: 1 %
Lymphocytes Relative: 12 %
Lymphs Abs: 1.2 10*3/uL (ref 0.7–4.0)
MCH: 31.4 pg (ref 26.0–34.0)
MCHC: 36 g/dL (ref 30.0–36.0)
MCV: 87.4 fL (ref 80.0–100.0)
Monocytes Absolute: 1 10*3/uL (ref 0.1–1.0)
Monocytes Relative: 11 %
Neutro Abs: 7.4 10*3/uL (ref 1.7–7.7)
Neutrophils Relative %: 74 %
Platelets: 150 10*3/uL (ref 150–400)
RBC: 2.61 MIL/uL — ABNORMAL LOW (ref 4.22–5.81)
RDW: 14.4 % (ref 11.5–15.5)
WBC: 9.8 10*3/uL (ref 4.0–10.5)
nRBC: 0 % (ref 0.0–0.2)

## 2023-02-20 LAB — GLUCOSE, CAPILLARY
Glucose-Capillary: 196 mg/dL — ABNORMAL HIGH (ref 70–99)
Glucose-Capillary: 183 mg/dL — ABNORMAL HIGH (ref 70–99)
Glucose-Capillary: 223 mg/dL — ABNORMAL HIGH (ref 70–99)
Glucose-Capillary: 214 mg/dL — ABNORMAL HIGH (ref 70–99)

## 2023-02-20 MED ORDER — CHLORPROMAZINE HCL 25 MG PO TABS
25.0000 mg | ORAL_TABLET | Freq: Three times a day (TID) | ORAL | Status: DC | PRN
  Administered 2023-02-23 (×2): 25 mg via ORAL
  Filled 2023-02-20 (×4): qty 1

## 2023-02-20 MED ORDER — HYDROMORPHONE HCL 1 MG/ML IJ SOLN: 1.0000 mg | INTRAMUSCULAR | Status: DC | PRN

## 2023-02-20 MED ORDER — HEPARIN SODIUM (PORCINE) 5000 UNIT/ML IJ SOLN
5000.0000 [IU] | Freq: Three times a day (TID) | INTRAMUSCULAR | Status: DC
  Administered 2023-02-20 – 2023-02-23 (×9): 5000 [IU] via SUBCUTANEOUS
  Filled 2023-02-20 (×10): qty 1

## 2023-02-20 MED ORDER — TRAMADOL HCL 50 MG PO TABS
50.0000 mg | ORAL_TABLET | Freq: Two times a day (BID) | ORAL | Status: DC | PRN
  Administered 2023-02-20 – 2023-02-23 (×3): 50 mg via ORAL
  Filled 2023-02-20 (×4): qty 1

## 2023-02-20 MED ORDER — METHOCARBAMOL 500 MG PO TABS
500.0000 mg | ORAL_TABLET | Freq: Two times a day (BID) | ORAL | Status: DC
  Administered 2023-02-20 – 2023-02-25 (×10): 500 mg via ORAL
  Filled 2023-02-20 (×11): qty 1

## 2023-02-20 NOTE — [place] FL2 (Signed)
[place] MEDICAID FL2 LEVEL OF CARE FORM     IDENTIFICATION  Patient Name: Justin [name] Birthdate: 1950-02-13 Sex: male Admission Date (Current Location): 02/16/2023  [facility] and [place] Number:  Producer, television/film/video and Address:  [facility], [address] [place], [place] [address]      Provider Number: [id number]  Attending Physician Name and Address:  Dorcas Carrow, MD  Relative Name and Phone Number:  Lyla Glassing [phone]    Current Level of Care: Hospital Recommended Level of Care: Skilled Nursing Facility Prior Approval Number:    Date Approved/Denied:   PASRR Number: [id number]  Discharge Plan: SNF    Current Diagnoses: Patient Active Problem List   Diagnosis Date Noted   Closed comminuted intertrochanteric fracture of proximal end of femur with nonunion, right 02/16/2023   Type 2 diabetes mellitus with hyperglycemia (HCC) 02/16/2023   Acute hypoxic respiratory failure (HCC) 06/05/2022   Hypokalemia 11/22/2021   Chronic kidney disease (CKD), stage IV (severe) (HCC) 02/28/2021   Stage 5 chronic kidney disease on chronic dialysis (HCC) 10/27/2020   BPH (benign prostatic hyperplasia) 10/18/2020   Dysuria 10/18/2020   Pinna disorder, left 10/18/2020   Renal disorder 10/18/2020   Wears glasses 10/18/2020   Subdural hematoma (HCC) 07/13/2020   Coronary artery disease involving native coronary artery of native heart without angina pectoris 11/19/2019   ED (erectile dysfunction) 11/19/2019   Essential hypertension 11/19/2019   History of chronic CHF 11/19/2019   ESRD on hemodialysis (HCC) 11/19/2019   Personal history of COVID-19 11/19/2019   COVID-19 virus infection 01/06/2019   COVID-19 01/06/2019   Other disorders of phosphorus metabolism 06/28/2018   Mild protein-calorie malnutrition (HCC) 05/02/2018   Hypertensive emergency    Acute respiratory failure with hypoxia (HCC)    Hypertensive chronic kidney disease with  stage 1 through stage 4 chronic kidney disease, or unspecified chronic kidney disease 05/01/2018   Anemia in chronic kidney disease 05/01/2018   Encounter for fitting and adjustment of extracorporeal dialysis catheter (HCC) 05/01/2018   Diarrhea, unspecified 05/01/2018   Coagulation defect, unspecified (HCC) 05/01/2018   Iron deficiency anemia, unspecified 05/01/2018   Pruritus, unspecified 05/01/2018   Pain, unspecified 05/01/2018   Shortness of breath 05/01/2018   Secondary hyperparathyroidism of renal origin (HCC) 05/01/2018   Nonrheumatic aortic valve stenosis    ESRD (end stage renal disease) (HCC)    Acute systolic CHF (congestive heart failure) (HCC)    Unspecified systolic (congestive) heart failure (HCC) 04/24/2018   Acute respiratory distress 04/24/2018   Hypertensive heart disease with congestive heart failure and stage 5 kidney disease (HCC) 04/24/2018   End stage renal disease (HCC) 04/24/2018   Type 2 diabetes mellitus with chronic kidney disease on chronic dialysis, with long-term current use of insulin (HCC) 04/24/2018   Hyperlipidemia 04/24/2018    Orientation RESPIRATION BLADDER Height & Weight     Self, Time, Situation, Place  Normal Continent Weight: 182 lb 1.6 oz (82.6 kg) Height:  5' 9.02" (175.3 cm)  BEHAVIORAL SYMPTOMS/MOOD NEUROLOGICAL BOWEL NUTRITION STATUS      Continent Diet (see discharge summary)  AMBULATORY STATUS COMMUNICATION OF NEEDS Skin   Total Care Verbally Surgical wounds, Skin abrasions                       Personal Care Assistance Level of Assistance  Bathing, Feeding, Dressing Bathing Assistance: Maximum assistance Feeding assistance: Limited assistance Dressing Assistance: Maximum assistance     Functional  Limitations Info  Sight, Hearing, Speech Sight Info: Adequate Hearing Info: Adequate Speech Info: Adequate    SPECIAL CARE FACTORS FREQUENCY  PT (By licensed PT), OT (By licensed OT)     PT Frequency: 5x week OT  Frequency: 5x week            Contractures Contractures Info: Not present    Additional Factors Info  Code Status, Allergies, Insulin Sliding Scale Code Status Info: full Allergies Info: NKA   Insulin Sliding Scale Info: Novolog: see discharge summary       Current Medications (02/20/2023):  This is the current hospital active medication list Current Facility-Administered Medications  Medication Dose Route Frequency Provider Last Rate Last Admin   0.9 %  sodium chloride infusion (Manually program via Guardrails IV Fluids)   Intravenous Once Dorcas Carrow, MD       acetaminophen (TYLENOL) tablet 650 mg  650 mg Oral Q6H PRN Charlsie Quest, MD       amLODipine (NORVASC) tablet 10 mg  10 mg Oral Daily Darreld Mclean R, MD   10 mg at 02/18/23 1030   bisacodyl (DULCOLAX) EC tablet 5 mg  5 mg Oral Daily PRN Charlsie Quest, MD       Chlorhexidine Gluconate Cloth 2 % PADS 6 each  6 each Topical Q0600 Berenda Morale, NP   6 each at 02/19/23 0454   feeding supplement (NEPRO CARB STEADY) liquid 237 mL  237 mL Oral TID BM Dorcas Carrow, MD   237 mL at 02/19/23 2227   HYDROmorphone (DILAUDID) injection 1 mg  1 mg Intravenous Q2H PRN Darreld Mclean R, MD   1 mg at 02/20/23 0653   insulin aspart (novoLOG) injection 0-5 Units  0-5 Units Subcutaneous QHS Dorcas Carrow, MD   3 Units at 02/19/23 2208   insulin aspart (novoLOG) injection 0-6 Units  0-6 Units Subcutaneous TID WC Dorcas Carrow, MD   1 Units at 02/20/23 0726   insulin aspart (novoLOG) injection 3 Units  3 Units Subcutaneous TID WC Dorcas Carrow, MD   3 Units at 02/20/23 0726   insulin glargine-yfgn (SEMGLEE) injection 12 Units  12 Units Subcutaneous QHS Dorcas Carrow, MD   12 Units at 02/19/23 2158   isosorbide mononitrate (IMDUR) 24 hr tablet 30 mg  30 mg Oral Once per day on Sunday Tuesday Thursday Saturday Charlsie Quest, MD   30 mg at 02/18/23 1030   LORazepam (ATIVAN) tablet 0.5 mg  0.5 mg Oral Q6H PRN Dorcas Carrow, MD    0.5 mg at 02/20/23 0511   methocarbamol (ROBAXIN) tablet 500 mg  500 mg Oral Q8H PRN Dorcas Carrow, MD   500 mg at 02/20/23 0511   metoprolol succinate (TOPROL-XL) 24 hr tablet 50 mg  50 mg Oral Daily Darreld Mclean R, MD   50 mg at 02/18/23 1030   multivitamin (RENA-VIT) tablet 1 tablet  1 tablet Oral QHS Dorcas Carrow, MD   1 tablet at 02/19/23 2157   mupirocin ointment (BACTROBAN) 2 % 1 Application  1 Application Nasal BID Charlsie Quest, MD   1 Application at 02/19/23 2157   ondansetron (ZOFRAN) injection 4 mg  4 mg Intravenous Q6H PRN Charlsie Quest, MD       rosuvastatin (CRESTOR) tablet 40 mg  40 mg Oral Daily Darreld Mclean R, MD   40 mg at 02/19/23 1028   senna-docusate (Senokot-S) tablet 1 tablet  1 tablet Oral QHS PRN Charlsie Quest, MD  traMADol (ULTRAM) tablet 50 mg  50 mg Oral Q12H PRN Dorcas Carrow, MD         Discharge Medications: Please see discharge summary for a list of discharge medications.  Relevant Imaging Results:  Relevant Lab Results:   Additional Information SSN: [id number].  HD pt: TTS, [facility] 530am chair time.  Lorri Frederick, LCSW

## 2023-02-20 NOTE — Plan of Care (Signed)
  Problem: Education: Goal: Ability to describe self-care measures that may prevent or decrease complications (Diabetes Survival Skills Education) will improve Outcome: Progressing   Problem: Coping: Goal: Ability to adjust to condition or change in health will improve Outcome: Progressing   Problem: Health Behavior/Discharge Planning: Goal: Ability to identify and utilize available resources and services will improve Outcome: Progressing   Problem: Nutritional: Goal: Maintenance of adequate nutrition will improve Outcome: Progressing   Problem: Education: Goal: Knowledge of General Education information will improve Description: Including pain rating scale, medication(s)/side effects and non-pharmacologic comfort measures Outcome: Progressing

## 2023-02-20 NOTE — Progress Notes (Signed)
Occupational Therapy Treatment Patient Details Name: Justin [name] MRN: [id number] DOB: [birth year] Today's Date: 02/20/2023   History of present illness 73 year old admitted 2/14 after fall at home with right hip fracture. IM nail right femur 2/14. PMH: ESRD on TTS dialysis, CAD, type 2 diabetes on insulin, hypertension hyperlipidemia.   OT comments  Patient making progress with bed mobility and transfers but continues to require +2 total assist for bed mobility and transfers with [facility]. Patient able to perform grooming tasks and self feeding seated up in recliner with setup and cues for encouragement. Acute OT to continue to follow to address established goals.      If plan is discharge home, recommend the following:  Assist for transportation;A lot of help with bathing/dressing/bathroom;A little help with walking and/or transfers;Assistance with cooking/housework   Equipment Recommendations  BSC/3in1;Tub/shower seat    Recommendations for Other Services      Precautions / Restrictions Precautions Precautions: Fall Recall of Precautions/Restrictions: Impaired Precaution/Restrictions Comments: decreased recall of safety/sequencing cues Restrictions Weight Bearing Restrictions Per Provider Order: Yes RLE Weight Bearing Per Provider Order: Weight bearing as tolerated Other Position/Activity Restrictions: Not able to tolerate full WB due to pain       Mobility Bed Mobility Overal bed mobility: Needs Assistance Bed Mobility: Supine to Sit     Supine to sit: HOB elevated, +2 for physical assistance, Total assist (+3 for safety due to patient lethargy and difficulty following commands due to perseveration on his pain)     General bed mobility comments: totalA for BLE repositioning despite max cues and increased time given in supine, so ended up needing +3 for safety with BLE and trunk support to mobilize from supine to L EOB, pt significantly guarding due to pain.     Transfers Overall transfer level: Needs assistance Equipment used: Ambulation equipment used Transfers: Sit to/from Stand, Bed to chair/wheelchair/BSC Sit to Stand: Max assist, +2 physical assistance, From elevated surface, Via lift equipment (+2-3 for safety)           General transfer comment: From elevated hospital bed surface to [facility] with +2-3 maxA for his safety and from [facility] flaps with +2 maxA (extra third person for safety from [facility] but only +2 lift assist needed). Pt unable to weight shift in standing at [facility] so pt sat on Stedy flaps and totalA to pivot to chair. Transfer via Lift Equipment: Stedy   Balance Overall balance assessment: Needs assistance Sitting-balance support: Feet supported, Bilateral upper extremity supported Sitting balance-Leahy Scale: Poor Sitting balance - Comments: L lean in sitting, +1-2 external support at trunk/BLE placement for safety with dynamic seated tasks at EOB Postural control: Posterior lean, Left lateral lean Standing balance support: Bilateral upper extremity supported, Reliant on assistive device for balance Standing balance-Leahy Scale: Zero Standing balance comment: +2-3 lift assist to stand and reliant on Stedy and external support, not able to march or weight shift today due to pain/guarding.                           ADL either performed or assessed with clinical judgement   ADL Overall ADL's : Needs assistance/impaired Eating/Feeding: Set up;Sitting Eating/Feeding Details (indicate cue type and reason): encouragement to increase intake Grooming: Wash/dry hands;Wash/dry face;Oral care;Brushing hair;Supervision/safety;Set up;Sitting Grooming Details (indicate cue type and reason): cues for encouragement  Extremity/Trunk Assessment Upper Extremity Assessment Upper Extremity Assessment: Overall WFL for tasks assessed            Vision       Perception      Praxis     Communication Communication Communication: No apparent difficulties   Cognition Arousal: Lethargic Behavior During Therapy: Anxious, Flat affect                                 Following commands: Intact        Cueing   Cueing Techniques: Verbal cues, Tactile cues, Gestural cues, Visual cues  Exercises      Shoulder Instructions       General Comments no c/o dizziness today but pt perseverating on pain and very fatigued, per sig other he hasn't been eating well, pt set up for lunch and able to self-feed sitting in chair at end of session, chair alarm on for safety.    Pertinent Vitals/ Pain       Pain Assessment Pain Assessment: Faces Faces Pain Scale: Hurts whole lot Pain Location: R hip with movement and weight bearing. Pt drowsy at rest after premedication by RN but pain appears severe with transfer training and bed mobility. Pain Descriptors / Indicators: Grimacing, Guarding, Sharp, Moaning, Pounding Pain Intervention(s): Limited activity within patient's tolerance, Monitored during session, Premedicated before session, Repositioned  Home Living                                          Prior Functioning/Environment              Frequency  Min 1X/week        Progress Toward Goals  OT Goals(current goals can now be found in the care plan section)  Progress towards OT goals: Progressing toward goals  Acute Rehab OT Goals Patient Stated Goal: discharge home OT Goal Formulation: With patient Time For Goal Achievement: 03/05/23 Potential to Achieve Goals: Good ADL Goals Pt Will Perform Grooming: with supervision;standing Pt Will Perform Lower Body Dressing: with min assist;sit to/from stand Pt Will Transfer to Toilet: with supervision;regular height toilet;ambulating  Plan      Co-evaluation    PT/OT/SLP Co-Evaluation/Treatment: Yes Reason for Co-Treatment: Complexity of the patient's impairments  (multi-system involvement);Necessary to address cognition/behavior during functional activity;For patient/therapist safety;To address functional/ADL transfers PT goals addressed during session: Mobility/safety with mobility;Balance;Proper use of DME;Strengthening/ROM OT goals addressed during session: ADL's and self-care      AM-PAC OT "6 Clicks" Daily Activity     Outcome Measure   Help from another person eating meals?: None Help from another person taking care of personal grooming?: A Little Help from another person toileting, which includes using toliet, bedpan, or urinal?: A Lot Help from another person bathing (including washing, rinsing, drying)?: A Lot Help from another person to put on and taking off regular upper body clothing?: None Help from another person to put on and taking off regular lower body clothing?: A Lot 6 Click Score: 17    End of Session Equipment Utilized During Treatment: Gait belt;Other (comment) Antony Salmon)  OT Visit Diagnosis: Unsteadiness on feet (R26.81);Muscle weakness (generalized) (M62.81);Pain Pain - Right/Left: Right Pain - part of body: Leg   Activity Tolerance Patient limited by pain   Patient Left in chair;with call bell/phone within reach;with family/visitor present  Nurse Communication Mobility status;Need for lift equipment        Time: 2956-2130 OT Time Calculation (min): 25 min  Charges: OT General Charges $OT Visit: 1 Visit OT Treatments $Self Care/Home Management : 8-22 mins  Alfonse Flavors, OTA Acute Rehabilitation Services  Office [phone]   Dewain Penning 02/20/2023, 3:08 PM

## 2023-02-20 NOTE — Progress Notes (Signed)
[facility] Progress Note   Subjective: Seen in room with wife. Has been up in chair, very painful to move. Wife reporting confusion post HD last PM.   Objective Vitals:   02/19/23 1943 02/19/23 2211 02/20/23 0505 02/20/23 0803  BP: (!) 153/62 (!) 142/56 (!) 154/55 (!) 151/59  Pulse: 73 86 74 70  Resp: 20   16  Temp: 99.5 F (37.5 C) 99.6 F (37.6 C) 99.5 F (37.5 C) 98.7 F (37.1 C)  TempSrc: Oral Oral Oral Oral  SpO2: 96% 95% 97% 98%  Weight:      Height:       Physical Exam General: Pleasant older male in NAD Heart:S1,S2 3/6 systolic M. No R/G Lungs: CTAB A/P Abdomen: NABS Extremities:Drsg R hip intact. No LE edema.  Dialysis Access: L AVF + T/B  Additional Objective Labs: Basic Metabolic Panel: Recent Labs  Lab 02/16/23 1813 02/17/23 0559 02/19/23 0533  NA 130* 133* 132*  K 3.8 3.3* 4.4  CL 89* 94* 89*  CO2 24 26 24   GLUCOSE 535* 214* 371*  BUN 30* 34* 79*  CREATININE 6.87* 7.83* 11.25*  CALCIUM 8.9 8.5* 8.2*  PHOS  --  4.3 7.8*   Liver Function Tests: Recent Labs  Lab 02/17/23 0559 02/19/23 0533  ALBUMIN 3.1* 2.8*   No results for input(s): "LIPASE", "AMYLASE" in the last 168 hours. CBC: Recent Labs  Lab 02/16/23 1813 02/17/23 0552 02/18/23 0903 02/19/23 0533 02/20/23 0847  WBC 8.3 9.5 10.9* 9.4 9.8  NEUTROABS 6.1  --  8.9* 6.8 7.4  HGB 10.3* 8.6* 7.1* 6.4* 8.2*  HCT 28.5* 23.9* 19.5* 18.0* 22.8*  MCV 87.7 86.3 85.9 89.1 87.4  PLT 190 171 152 148* 150   Blood Culture    Component Value Date/Time   SDES BLOOD RIGHT ARM 06/05/2022 1759   SPECREQUEST  06/05/2022 1759    BOTTLES DRAWN AEROBIC AND ANAEROBIC Blood Culture results may not be optimal due to an excessive volume of blood received in culture bottles   CULT  06/05/2022 1759    NO GROWTH 5 DAYS Performed at [facility], [address], [place], [place] [address]    REPTSTATUS 06/10/2022 FINAL 06/05/2022 1759    Cardiac Enzymes: No results for  input(s): "CKTOTAL", "CKMB", "CKMBINDEX", "TROPONINI" in the last 168 hours. CBG: Recent Labs  Lab 02/19/23 1107 02/19/23 1811 02/19/23 2033 02/20/23 0545 02/20/23 1115  GLUCAP 281* 209* 287* 196* 183*   Iron Studies: No results for input(s): "IRON", "TIBC", "TRANSFERRIN", "FERRITIN" in the last 72 hours. @lablastinr3 @ Studies/Results: No results found. Medications:   sodium chloride   Intravenous Once   amLODipine  10 mg Oral Daily   Chlorhexidine Gluconate Cloth  6 each Topical Q0600   feeding supplement (NEPRO CARB STEADY)  237 mL Oral TID BM   insulin aspart  0-5 Units Subcutaneous QHS   insulin aspart  0-6 Units Subcutaneous TID WC   insulin aspart  3 Units Subcutaneous TID WC   insulin glargine-yfgn  12 Units Subcutaneous QHS   isosorbide mononitrate  30 mg Oral Once per day on Sunday Tuesday Thursday Saturday   metoprolol succinate  50 mg Oral Daily   multivitamin  1 tablet Oral QHS   mupirocin ointment  1 Application Nasal BID   rosuvastatin  40 mg Oral Daily     Dialysis Orders:   TTS [place] 4h  B450  81.1kg   2K bath  L AVF  Heparin none  - last HD 2/13,  post wt 81.6kg, gets to dry wt, wt gain ~ 3kg - hectorol 2 mcg IV  - no esa - last Hb 10.5, ferritin 1605, pth 222    Renal-related home meds: - ferric citrate 3 ac tid - hydralazine 50 tid  - norvasc 10 - metoprolol xl 50 every day - others: imdur, sl ntg prn, statin, novolin insulin, asa   Assessment/Plan: R hip fracture - after a fall. S/P medullary nail procedure done today 2/15 by orthopedic surgery. PT now working with patient. ESRD - on HD TTS. Pt is stable, no vol or lab issues. HD was not done yesterday d/t high patient census. Next HD 2/17 OFF SCHEDULE. Patient prefers to have his treatment to be done in the afternoon so he's not worn out when working with PT.Will attempt to schedule. He will resume T,Th,S Schedule 02/22/2023 HTN - BP's currently acceptable. Continue current  medications Volume - Euvolemic on exam. UF as tolerated. Anemia of esrd - hgb 6.4 this AM. S/P 1 unit PRBCs 02/19/2023. Repeat HGB 8.2. Check iron panel next HD Secondary hyperparathyroidism - Ca and phos in range but alb is low, on Nepro. Cont binders w/ meals and IV vdra.   Natika Geyer H. Haidee Stogsdill NP-C 02/20/2023, 1:07 PM  [employer] [phone]

## 2023-02-20 NOTE — Progress Notes (Signed)
PROGRESS NOTE    Justin [name]  BJY:[id number] DOB: [birth year] DOA: 02/16/2023 PCP: Adrian Prince, MD    Brief Narrative:  73 year old with ESRD on TTS dialysis, CAD, type 2 diabetes on insulin, hypertension hyperlipidemia who was working at home, lost his balance and fell back landing on the ground, right hip pain.  In the emergency room hemodynamically stable.  Found to have right hip fracture. Underwent ORIF. 2/17, 1 unit PRBC transfusion for hemoglobin 6.4. 2/18, overnight with some delirium after hemodialysis.   Subjective:  Patient seen and examined.  "I feel fine but others do not think I am fine".  Patient's friend [name] at the bedside.  She is his healthcare power of attorney.  She was very tearful with patient's impulsiveness and irregular sleeping behavior and confusion. Patient is mostly alert awake oriented.  He was noticed intermittently confused especially after hemodialysis. Patient asked to give him tramadol instead of oxycodone that makes him loopy.   Assessment & Plan:   Close traumatic right intertrochanteric hip fracture: Status post ORIF, IM nail right femur. Dr Hulda Humphrey 2/15 Weightbearing as tolerated Adequate pain control IV and oral opiates. Continue to work with PT OT. DVT prophylaxis,  Subcu heparin while in the hospital.  Aspirin 81 mg twice daily after starting mobilizing.  ESRD on hemodialysis: Last dialysis 2/13.  Nephrology notified for dialysis need. Received dialysis 2/17.  May need further dialysis.  Type 2 diabetes with hyperglycemia: Blood sugars elevated.  Increase dose of Lantus to 12 units nightly, start prandial insulin and sliding scale insulin.  Anemia of chronic disease, acute on chronic anemia: Hemoglobin 6.4.  1 unit PRBC transfusion with appropriate response.  Hemoglobin 8.2 today.  Chronic medical issues including Coronary artery disease, stable and currently on aspirin, Toprol-XL, Imdur and statin. Hypertension, as above.   Stable. Hyperlipidemia, on rosuvastatin.  Hospital-acquired delirium: Multifactorial.  Currently without neurological deficits.  May need additional dialysis. Fall precautions.  Delirium precautions.   DVT prophylaxis: heparin injection 5,000 Units Start: 02/20/23 1415 SCDs Start: 02/16/23 2147   Code Status: Full code Family Communication: Willaim Sheng at the bedside. Disposition Plan: Status is: Inpatient Remains inpatient appropriate because: Will need a skilled nursing facility.     Consultants:  Orthopedics Nephrology for dialysis need  Procedures:  Right femoral IM nailing  Antimicrobials:  Perioperative     Objective: Vitals:   02/19/23 1943 02/19/23 2211 02/20/23 0505 02/20/23 0803  BP: (!) 153/62 (!) 142/56 (!) 154/55 (!) 151/59  Pulse: 73 86 74 70  Resp: 20   16  Temp: 99.5 F (37.5 C) 99.6 F (37.6 C) 99.5 F (37.5 C) 98.7 F (37.1 C)  TempSrc: Oral Oral Oral Oral  SpO2: 96% 95% 97% 98%  Weight:      Height:        Intake/Output Summary (Last 24 hours) at 02/20/2023 1322 Last data filed at 02/19/2023 1658 Gross per 24 hour  Intake 300 ml  Output 2000 ml  Net -1700 ml    Filed Weights   02/17/23 0865 02/19/23 1340 02/19/23 1733  Weight: 81.6 kg 84.1 kg 82.6 kg    Examination:  Physical Exam Constitutional:      Comments: Alert awake and mostly oriented.  Intermittently sleepy.  HENT:     Head: Normocephalic.  Cardiovascular:     Rate and Rhythm: Regular rhythm.     Heart sounds: Normal heart sounds.  Pulmonary:     Breath sounds: Normal breath sounds.  Abdominal:  Palpations: Abdomen is soft.  Musculoskeletal:     Comments: Left UE AV fistula   Skin:    Comments: Post op dressing intact         Data Reviewed: I have personally reviewed following labs and imaging studies  CBC: Recent Labs  Lab 02/16/23 1813 02/17/23 0552 02/18/23 0903 02/19/23 0533 02/20/23 0847  WBC 8.3 9.5 10.9* 9.4 9.8  NEUTROABS 6.1  --  8.9*  6.8 7.4  HGB 10.3* 8.6* 7.1* 6.4* 8.2*  HCT 28.5* 23.9* 19.5* 18.0* 22.8*  MCV 87.7 86.3 85.9 89.1 87.4  PLT 190 171 152 148* 150   Basic Metabolic Panel: Recent Labs  Lab 02/16/23 1813 02/17/23 0559 02/19/23 0533  NA 130* 133* 132*  K 3.8 3.3* 4.4  CL 89* 94* 89*  CO2 24 26 24   GLUCOSE 535* 214* 371*  BUN 30* 34* 79*  CREATININE 6.87* 7.83* 11.25*  CALCIUM 8.9 8.5* 8.2*  PHOS  --  4.3 7.8*   GFR: Estimated Creatinine Clearance: 5.9 mL/min (A) (by C-G formula based on SCr of 11.25 mg/dL (H)). Liver Function Tests: Recent Labs  Lab 02/17/23 0559 02/19/23 0533  ALBUMIN 3.1* 2.8*   No results for input(s): "LIPASE", "AMYLASE" in the last 168 hours. No results for input(s): "AMMONIA" in the last 168 hours. Coagulation Profile: Recent Labs  Lab 02/16/23 1813  INR 1.1   Cardiac Enzymes: No results for input(s): "CKTOTAL", "CKMB", "CKMBINDEX", "TROPONINI" in the last 168 hours. BNP (last 3 results) No results for input(s): "PROBNP" in the last 8760 hours. HbA1C: No results for input(s): "HGBA1C" in the last 72 hours.  CBG: Recent Labs  Lab 02/19/23 1107 02/19/23 1811 02/19/23 2033 02/20/23 0545 02/20/23 1115  GLUCAP 281* 209* 287* 196* 183*   Lipid Profile: No results for input(s): "CHOL", "HDL", "LDLCALC", "TRIG", "CHOLHDL", "LDLDIRECT" in the last 72 hours. Thyroid Function Tests: No results for input(s): "TSH", "T4TOTAL", "FREET4", "T3FREE", "THYROIDAB" in the last 72 hours. Anemia Panel: No results for input(s): "VITAMINB12", "FOLATE", "FERRITIN", "TIBC", "IRON", "RETICCTPCT" in the last 72 hours. Sepsis Labs: No results for input(s): "PROCALCITON", "LATICACIDVEN" in the last 168 hours.  Recent Results (from the past 240 hours)  Surgical PCR screen     Status: None   Collection Time: 02/17/23  2:45 AM   Specimen: Nasal Mucosa; Nasal Swab  Result Value Ref Range Status   MRSA, PCR NEGATIVE NEGATIVE Final   Staphylococcus aureus NEGATIVE NEGATIVE  Final    Comment: (NOTE) The Xpert SA Assay (FDA approved for NASAL specimens in patients 37 years of age and older), is one component of a comprehensive surveillance program. It is not intended to diagnose infection nor to guide or monitor treatment. Performed at [facility], [address], [place], [place] [address]          Radiology Studies: No results found.       Scheduled Meds:  sodium chloride   Intravenous Once   amLODipine  10 mg Oral Daily   Chlorhexidine Gluconate Cloth  6 each Topical Q0600   feeding supplement (NEPRO CARB STEADY)  237 mL Oral TID BM   heparin injection (subcutaneous)  5,000 Units Subcutaneous Q8H   insulin aspart  0-5 Units Subcutaneous QHS   insulin aspart  0-6 Units Subcutaneous TID WC   insulin aspart  3 Units Subcutaneous TID WC   insulin glargine-yfgn  12 Units Subcutaneous QHS   isosorbide mononitrate  30 mg Oral Once per day on Sunday Tuesday Thursday  Saturday   methocarbamol  500 mg Oral Q12H   metoprolol succinate  50 mg Oral Daily   multivitamin  1 tablet Oral QHS   mupirocin ointment  1 Application Nasal BID   rosuvastatin  40 mg Oral Daily   Continuous Infusions:     LOS: 4 days    Time spent: 35 minutes    Dorcas Carrow, MD [facility]

## 2023-02-20 NOTE — TOC Initial Note (Signed)
Transition of Care [facility]) - Initial/Assessment Note    Patient Details  Name: Justin [name] MRN: [id number] Date of Birth: 1950-05-26  [facility]) CM/SW Contact:    Lorri Frederick, LCSW Phone Number: 02/20/2023, 10:03 AM  Clinical Narrative:       CSW met with pt and friend [name] regarding PT recommendation for SNF.  Permission given to speak with [name] present.  They are agreeable to SNF, requesting [facility].  If no [facility], would like SNF in [place].  HD pt TTS at [facility] with 0530 chair time, per [name].        Pt from home with [name], no current services.  Referral sent out to [facility], [facility], and [facility] SNFs, reached out to [facility] to review first.       Expected Discharge Plan: Skilled Nursing Facility Barriers to Discharge: SNF Pending bed offer, Continued Medical Work up   Patient Goals and CMS Choice Patient states their goals for this hospitalization and ongoing recovery are:: back to normal   Choice offered to / list presented to : Patient      Expected Discharge Plan and Services In-house Referral: Clinical Social Work   Post Acute Care Choice: Skilled Nursing Facility Living arrangements for the past 2 months: Single Family Home                                      Prior Living Arrangements/Services Living arrangements for the past 2 months: Single Family Home Lives with:: Significant Other Patient language and need for interpreter reviewed:: Yes Do you feel safe going back to the place where you live?: Yes      Need for Family Participation in Patient Care: No (Comment) Care giver support system in place?: Yes (comment) Current home services: Other (comment) (none) Criminal Activity/Legal Involvement Pertinent to Current Situation/Hospitalization: No - Comment as needed  Activities of Daily Living   ADL Screening (condition at time of admission) Independently performs ADLs?: Yes (appropriate  for developmental age) Is the patient deaf or have difficulty hearing?: No Does the patient have difficulty seeing, even when wearing glasses/contacts?: No Does the patient have difficulty concentrating, remembering, or making decisions?: No  Permission Sought/Granted Permission sought to share information with : Family Supports Permission granted to share information with : Yes, Verbal Permission Granted  Share Information with NAME: [name], sig other  Permission granted to share info w AGENCY: SNF        Emotional Assessment Appearance:: Appears stated age Attitude/Demeanor/Rapport: Engaged Affect (typically observed): Appropriate, Pleasant Orientation: : Oriented to Self, Oriented to Place, Oriented to  Time, Oriented to Situation      Admission diagnosis:  Hyperglycemia [R73.9] Closed fracture of right hip, initial encounter (HCC) [S72.001A] Closed comminuted intertrochanteric fracture of proximal end of femur with nonunion, right [S72.141K] Patient Active Problem List   Diagnosis Date Noted   Closed comminuted intertrochanteric fracture of proximal end of femur with nonunion, right 02/16/2023   Type 2 diabetes mellitus with hyperglycemia (HCC) 02/16/2023   Acute hypoxic respiratory failure (HCC) 06/05/2022   Hypokalemia 11/22/2021   Chronic kidney disease (CKD), stage IV (severe) (HCC) 02/28/2021   Stage 5 chronic kidney disease on chronic dialysis (HCC) 10/27/2020   BPH (benign prostatic hyperplasia) 10/18/2020   Dysuria 10/18/2020   Pinna disorder, left 10/18/2020   Renal disorder 10/18/2020   Wears glasses 10/18/2020   Subdural  hematoma (HCC) 07/13/2020   Coronary artery disease involving native coronary artery of native heart without angina pectoris 11/19/2019   ED (erectile dysfunction) 11/19/2019   Essential hypertension 11/19/2019   History of chronic CHF 11/19/2019   ESRD on hemodialysis (HCC) 11/19/2019   Personal history of COVID-19 11/19/2019   COVID-19  virus infection 01/06/2019   COVID-19 01/06/2019   Other disorders of phosphorus metabolism 06/28/2018   Mild protein-calorie malnutrition (HCC) 05/02/2018   Hypertensive emergency    Acute respiratory failure with hypoxia (HCC)    Hypertensive chronic kidney disease with stage 1 through stage 4 chronic kidney disease, or unspecified chronic kidney disease 05/01/2018   Anemia in chronic kidney disease 05/01/2018   Encounter for fitting and adjustment of extracorporeal dialysis catheter (HCC) 05/01/2018   Diarrhea, unspecified 05/01/2018   Coagulation defect, unspecified (HCC) 05/01/2018   Iron deficiency anemia, unspecified 05/01/2018   Pruritus, unspecified 05/01/2018   Pain, unspecified 05/01/2018   Shortness of breath 05/01/2018   Secondary hyperparathyroidism of renal origin (HCC) 05/01/2018   Nonrheumatic aortic valve stenosis    ESRD (end stage renal disease) (HCC)    Acute systolic CHF (congestive heart failure) (HCC)    Unspecified systolic (congestive) heart failure (HCC) 04/24/2018   Acute respiratory distress 04/24/2018   Hypertensive heart disease with congestive heart failure and stage 5 kidney disease (HCC) 04/24/2018   End stage renal disease (HCC) 04/24/2018   Type 2 diabetes mellitus with chronic kidney disease on chronic dialysis, with long-term current use of insulin (HCC) 04/24/2018   Hyperlipidemia 04/24/2018   PCP:  Adrian Prince, MD Pharmacy:   [facility] [address] [place], [place] - [address] [place] [address] Phone: [phone] Fax: [phone]     Social Drivers of Health (SDOH) Social History: SDOH Screenings   Food Insecurity: No Food Insecurity (02/17/2023)  Housing: Low Risk  (02/17/2023)  Transportation Needs: No Transportation Needs (02/17/2023)  Utilities: Not At Risk (02/17/2023)  Financial Resource Strain: Low Risk  (07/13/2020)   Received from [facility], [facility]  Social Connections:  Socially Integrated (02/17/2023)  Tobacco Use: Low Risk  (02/17/2023)   SDOH Interventions:     Readmission Risk Interventions     No data to display

## 2023-02-20 NOTE — Progress Notes (Signed)
.  Subjective: 3 Days Post-Op Procedure(s) (LRB): INTRAMEDULLARY (IM) NAIL INTERTROCHANTERIC (Right)  Patient seen and examined. Minimal pain at rest but significant pain with PT. Received 1u pRBCs during dialysis yesterday for Hgb 6.4. Hgb 8.2 today  Activity level:  WBAT RLE Diet tolerance:  as tolerated  Patient reports pain as severe when working with PT, minimal at rest  Objective: Vital signs in last 24 hours: Temp:  [98.1 F (36.7 C)-99.6 F (37.6 C)] 98.1 F (36.7 C) (02/18 1950) Pulse Rate:  [66-86] 66 (02/18 1950) Resp:  [16] 16 (02/18 1950) BP: (129-154)/(51-59) 142/55 (02/18 1950) SpO2:  [95 %-99 %] 95 % (02/18 1950)  Labs: Recent Labs    02/18/23 0903 02/19/23 0533 02/20/23 0847  HGB 7.1* 6.4* 8.2*   Recent Labs    02/19/23 0533 02/20/23 0847  WBC 9.4 9.8  RBC 2.02* 2.61*  HCT 18.0* 22.8*  PLT 148* 150   Recent Labs    02/19/23 0533  NA 132*  K 4.4  CL 89*  CO2 24  BUN 79*  CREATININE 11.25*  GLUCOSE 371*  CALCIUM 8.2*   No results for input(s): "LABPT", "INR" in the last 72 hours.  Physical Exam:  Neurologically intact Neurovascular intact Sensation intact distally Intact pulses distally Dorsiflexion/Plantar flexion intact Incision: dressing C/D/I and no drainage Compartment soft  Assessment/Plan:  3 Days Post-Op Procedure(s) (LRB): INTRAMEDULLARY (IM) NAIL INTERTROCHANTERIC (Right)  WBAT RLE PT/OT Multimodal pain control with PRN pain medication preferring oral medicines.  DVT prophylaxis: Recommend HSQ until mobilizing and then consider transition to aspirin 81mg  BID once better mobilization. S/p 1u pRBCs for Hgn 6.4. Now 8.2. Likely due to acute blood loss anemia in setting of chronic  anemia, Continue to monitor CBC and transfuse as needed Rest of medical management/dialysis per primary   Luci Bank 02/20/2023, 8:52 PM

## 2023-02-20 NOTE — Progress Notes (Signed)
Physical Therapy Treatment Patient Details Name: Justin [name] MRN: [id number] DOB: [birth year] Today's Date: 02/20/2023   History of Present Illness 73 year old admitted 2/14 after fall at home with right hip fracture. IM nail right femur 2/14. PMH: ESRD on TTS dialysis, CAD, type 2 diabetes on insulin, hypertension hyperlipidemia.    PT Comments  Pt received in supine, drowsy but agreeable to therapy session with encouragement from therapists and pt's significant other. Pt needing increased time and multimodal cues for bed mobility and transfers and needing increased physical assist this date to get to edge of bed due to increased drowsiness and continued severe pain which causes decreased attention to task. Pt needing up to +2 totalA for bed mobility and +2 max to totalA for transfer OOB to chair due to need for platform standing lift as pt unable to weight shift BLE to take steps or march due to pain. Pt agreeable to sit up in chair for lunch, sig other present to encourage him to eat and RN notified of safe technique for return transfer from chair to bed. Pt will continue to benefit from skilled rehab in a post acute setting < 3 hours per day to maximize functional gains before returning home.    If plan is discharge home, recommend the following: Assistance with cooking/housework;Assist for transportation;Help with stairs or ramp for entrance;Two people to help with walking and/or transfers;A lot of help with bathing/dressing/bathroom   Can travel by private vehicle     No  Equipment Recommendations  BSC/3in1;Wheelchair (measurements PT);Wheelchair cushion (measurements PT);Hospital bed (pt also has transport chair but would need manual WC if home by himself as his spouse works)    Recommendations for [employer]       Precautions / Restrictions Precautions Precautions: Fall Recall of Precautions/Restrictions: Impaired Precaution/Restrictions Comments: decreased recall of  safety/sequencing cues Restrictions Weight Bearing Restrictions Per Provider Order: Yes RLE Weight Bearing Per Provider Order: Weight bearing as tolerated Other Position/Activity Restrictions: Not able to tolerate full WB due to pain     Mobility  Bed Mobility Overal bed mobility: Needs Assistance Bed Mobility: Supine to Sit     Supine to sit: HOB elevated, +2 for physical assistance, Total assist (+3 for safety due to pt lethargy and difficulty following commands due to perseveration on his pain.)     General bed mobility comments: totalA for BLE repositioning despite max cues and increased time given in supine, so ended up needing +3 for safety with BLE and trunk support to mobilize from supine to L EOB, pt significantly guarding due to pain.    Transfers Overall transfer level: Needs assistance Equipment used: Ambulation equipment used Transfers: Sit to/from Stand, Bed to chair/wheelchair/BSC Sit to Stand: Max assist, +2 physical assistance, From elevated surface, Via lift equipment (+2-3 for safety)           General transfer comment: From elevated hospital bed surface to [facility] with +2-3 maxA for his safety and from [facility] flaps with +2 maxA (extra third person for safety from [facility] but only +2 lift assist needed). Pt unable to weight shift in standing at [facility] so pt sat on Stedy flaps and totalA to pivot to chair. Transfer via Lift Equipment: Stedy  Ambulation/Gait               General Gait Details: Pt unable to weight shift in stance, Stedy for OOB to Dealer  Tilt Bed    Modified Rankin (Stroke Patients Only)       Balance Overall balance assessment: Needs assistance Sitting-balance support: Feet supported, Bilateral upper extremity supported Sitting balance-Leahy Scale: Poor Sitting balance - Comments: L lean in sitting, +1-2 external support at trunk/BLE placement for safety with dynamic seated tasks  at EOB Postural control: Posterior lean, Left lateral lean Standing balance support: Bilateral upper extremity supported, Reliant on assistive device for balance Standing balance-Leahy Scale: Zero Standing balance comment: +2-3 lift assist to stand and reliant on Stedy and external support, not able to march or weight shift today due to pain/guarding.                            Communication Communication Communication: No apparent difficulties (just slow to respond initially to cues due to lethargy but able to verbalize his pain)  Cognition Arousal: Lethargic Behavior During Therapy: Anxious, Flat affect   PT - Cognitive impairments: Sequencing, Problem solving, Initiation, Safety/Judgement, Attention                       PT - Cognition Comments: More drowsy today after premedication and pt significant other reports he had overnight delirium. Slow to initiate due to pain/fear of pain and increased lethargy today. Dense multimodal cues for sequencing and safety and for pursed-lip breathing as pt tends to hold his breath. Pt appears more alert at EOB and up in chair. Following commands: Intact      Cueing Cueing Techniques: Verbal cues, Tactile cues, Gestural cues, Visual cues  Exercises General Exercises - Lower Extremity Ankle Circles/Pumps: AROM, Both, 10 reps, Supine Short Arc Quad: AROM, Both, Seated (a few reps with encouragement/cues) Hip ABduction/ADduction: PROM, Both, Supine (a couple reps to move toward [facility] and as warm-up)    General Comments General comments (skin integrity, edema, etc.): no c/o dizziness today but pt perseverating on pain and very fatigued, per sig other he hasn't been eating well, pt set up for lunch and able to self-feed sitting in chair at end of session, chair alarm on for safety.      Pertinent Vitals/Pain Pain Assessment Pain Assessment: Faces Faces Pain Scale: Hurts whole lot Pain Location: R hip with movement and weight  bearing. Pt drowsy at rest after premedication by RN but pain appears severe with transfer training and bed mobility. Pain Descriptors / Indicators: Grimacing, Guarding, Sharp, Moaning, Pounding (yelling) Pain Intervention(s): Limited activity within patient's tolerance, Monitored during session, Premedicated before session, Repositioned, Ice applied (kept pt in chair posture with feet on floor (chair alarm on for safety) once in recliner as he states BLE too painful to straighten his legs as they were in the bed.)    Home Living                          Prior Function            PT Goals (current goals can now be found in the care plan section) Acute Rehab PT Goals Patient Stated Goal: to go home (if safe, per long time gf he needs to be mobilizing well without assist as she works 2 jobs) PT Goal Formulation: With patient Time For Goal Achievement: 03/03/23 Progress towards PT goals: Progressing toward goals    Frequency    Min 1X/week      PT Plan      Co-evaluation PT/OT/SLP Co-Evaluation/Treatment:  Yes Reason for Co-Treatment: Complexity of the patient's impairments (multi-system involvement);Necessary to address cognition/behavior during functional activity;For patient/therapist safety;To address functional/ADL transfers PT goals addressed during session: Mobility/safety with mobility;Balance;Proper use of DME;Strengthening/ROM        AM-PAC PT "6 Clicks" Mobility   Outcome Measure  Help needed turning from your back to your side while in a flat bed without using bedrails?: A Lot Help needed moving from lying on your back to sitting on the side of a flat bed without using bedrails?: Total (to return to supine in flat bed/no rails) Help needed moving to and from a bed to a chair (including a wheelchair)?: Total (+2 assist and can't move his R leg without assist) Help needed standing up from a chair using your arms (e.g., wheelchair or bedside chair)?:  Total Help needed to walk in hospital room?: Total Help needed climbing 3-5 steps with a railing? : Total 6 Click Score: 7    End of Session Equipment Utilized During Treatment: Gait belt Activity Tolerance: Patient limited by pain;Patient limited by lethargy Patient left: with call bell/phone within reach;with family/visitor present;Other (comment);in chair;with chair alarm set (spouse [name] in room with him, up in chair to eat, ice packs over R shoulder and R hip) Nurse Communication: Mobility status;Need for lift equipment;Other (comment);Precautions (+2 with Stedy or hoyer due to pain) PT Visit Diagnosis: Unsteadiness on feet (R26.81);Muscle weakness (generalized) (M62.81);Pain Pain - Right/Left: Right Pain - part of body: Leg     Time: 1130-1155 PT Time Calculation (min) (ACUTE ONLY): 25 min  Charges:    $Therapeutic Activity: 8-22 mins PT General Charges $$ ACUTE PT VISIT: 1 Visit                     Zalman Hull P., PTA Acute Rehabilitation Services Secure Chat Preferred 9a-5:30pm Office: [phone]    Dorathy Kinsman [facility] 02/20/2023, 2:21 PM

## 2023-02-20 NOTE — Care Management Important Message (Signed)
Important Message  Patient Details  Name: WYMAN MESCHKE MRN: [id number] Date of Birth: 1950-12-12   Important Message Given:  Yes - Medicare IM     Sherilyn Banker 02/20/2023, 8:14 AM

## 2023-02-21 DIAGNOSIS — S72141K Displaced intertrochanteric fracture of right femur, subsequent encounter for closed fracture with nonunion: Secondary | ICD-10-CM | POA: Diagnosis not present

## 2023-02-21 LAB — GLUCOSE, CAPILLARY
Glucose-Capillary: 199 mg/dL — ABNORMAL HIGH (ref 70–99)
Glucose-Capillary: 189 mg/dL — ABNORMAL HIGH (ref 70–99)
Glucose-Capillary: 205 mg/dL — ABNORMAL HIGH (ref 70–99)
Glucose-Capillary: 208 mg/dL — ABNORMAL HIGH (ref 70–99)

## 2023-02-21 MED ORDER — DOCUSATE SODIUM 100 MG PO CAPS
100.0000 mg | ORAL_CAPSULE | Freq: Two times a day (BID) | ORAL | Status: DC
  Administered 2023-02-21 – 2023-02-25 (×9): 100 mg via ORAL
  Filled 2023-02-21 (×10): qty 1

## 2023-02-21 MED ORDER — FERRIC CITRATE 1 GM 210 MG(FE) PO TABS
630.0000 mg | ORAL_TABLET | Freq: Three times a day (TID) | ORAL | Status: DC
  Administered 2023-02-21 – 2023-02-26 (×9): 630 mg via ORAL
  Filled 2023-02-21 (×16): qty 3

## 2023-02-21 MED ORDER — DARBEPOETIN ALFA 100 MCG/0.5ML IJ SOSY
100.0000 ug | PREFILLED_SYRINGE | INTRAMUSCULAR | Status: DC
Start: 2023-02-22 — End: 2023-02-23
  Filled 2023-02-21: qty 0.5

## 2023-02-21 MED ORDER — POLYETHYLENE GLYCOL 3350 17 G PO PACK
17.0000 g | PACK | Freq: Every day | ORAL | Status: DC
  Administered 2023-02-21: 17 g via ORAL
  Filled 2023-02-21: qty 1

## 2023-02-21 MED ORDER — BISACODYL 10 MG RE SUPP
10.0000 mg | Freq: Once | RECTAL | Status: AC
  Administered 2023-02-21: 10 mg via RECTAL
  Filled 2023-02-21: qty 1

## 2023-02-21 NOTE — Progress Notes (Signed)
PROGRESS NOTE    Justin [name]  WUJ:[id number] DOB: Jan 20, 1950 DOA: 02/16/2023 PCP: Adrian Prince, MD    Brief Narrative:  73 year old with ESRD on TTS dialysis, CAD, type 2 diabetes on insulin, hypertension hyperlipidemia who was working at home, lost his balance and fell back landing on the ground, right hip pain.  In the emergency room hemodynamically stable.  Found to have right hip fracture. Underwent ORIF. 2/17, 1 unit PRBC transfusion for hemoglobin 6.4. 2/18, overnight with some delirium after hemodialysis.   Subjective:  Patient seen and examined.  Had some impulsiveness and according to patient's significant other, he was disoriented on the morning on the phone.  On overall exam, patient looks more comfortable and now able to keep abnormal conversation.  He was very unhappy about unable to move his right leg and nobody helped him.   Assessment & Plan:   Close traumatic right intertrochanteric hip fracture: Status post ORIF, IM nail right femur. Dr Hulda Humphrey 2/15 Weightbearing as tolerated Adequate pain control IV and oral opiates.  Prefers tramadol. Continue to work with PT OT. DVT prophylaxis, Subcu heparin while in the hospital.  Aspirin 81 mg twice daily after starting mobilizing.  ESRD on hemodialysis: TTS schedule.  Getting intermittent dialysis in the hospital.  Trying to schedule dialysis with skilled nursing facility.  Type 2 diabetes with hyperglycemia: Blood sugars elevated.  Increased dose of Lantus to 12 units nightly, started prandial insulin and sliding scale insulin.  Will monitor today.  Anemia of chronic disease, acute on chronic anemia: Hemoglobin 6.4.  1 unit PRBC transfusion with appropriate response.  Hemoglobin 8.2.  Recheck tomorrow.  Chronic medical issues including Coronary artery disease, stable and currently on aspirin, Toprol-XL, Imdur and statin. Hypertension, as above.  Stable. Hyperlipidemia, on rosuvastatin.  Hospital-acquired delirium:  Multifactorial.  Currently without neurological deficits.  May need additional dialysis. Fall precautions.  Delirium precautions. Symptomatic management.  Constipation: Dose of MiraLAX and Dulcolax suppository today.  Will need scheduled stool softener on discharge.   DVT prophylaxis: heparin injection 5,000 Units Start: 02/20/23 1415 SCDs Start: 02/16/23 2147   Code Status: Full code Family Communication: Willaim Sheng called and updated on the phone. Disposition Plan: Status is: Inpatient Remains inpatient appropriate because: Will need a skilled nursing facility.  Should expect discharge next 24 to 48 hours.     Consultants:  Orthopedics Nephrology for dialysis need  Procedures:  Right femoral IM nailing  Antimicrobials:  Perioperative     Objective: Vitals:   02/20/23 1341 02/20/23 1951 02/21/23 0524 02/21/23 0744  BP: (!) 129/51 (!) 142/55 (!) 157/40 (!) 148/50  Pulse: 67 66 67 69  Resp: 16 16 16 18   Temp: 98.4 F (36.9 C) 98.1 F (36.7 C) 98.3 F (36.8 C) 98.6 F (37 C)  TempSrc: Oral   Oral  SpO2: 99% 95% 97% 100%  Weight:      Height:       No intake or output data in the 24 hours ending 02/21/23 0929   Filed Weights   02/17/23 9562 02/19/23 1340 02/19/23 1733  Weight: 81.6 kg 84.1 kg 82.6 kg    Examination:  Physical Exam Constitutional:      Comments: Patient is alert awake and oriented.  Slightly anxious and unhappy today.  HENT:     Head: Normocephalic.  Cardiovascular:     Rate and Rhythm: Regular rhythm.     Heart sounds: Normal heart sounds.  Pulmonary:     Breath sounds: Normal breath sounds.  Abdominal:     Palpations: Abdomen is soft.  Musculoskeletal:     Comments: Left UE AV fistula   Skin:    Comments: Post op dressing intact         Data Reviewed: I have personally reviewed following labs and imaging studies  CBC: Recent Labs  Lab 02/16/23 1813 02/17/23 0552 02/18/23 0903 02/19/23 0533 02/20/23 0847  WBC 8.3  9.5 10.9* 9.4 9.8  NEUTROABS 6.1  --  8.9* 6.8 7.4  HGB 10.3* 8.6* 7.1* 6.4* 8.2*  HCT 28.5* 23.9* 19.5* 18.0* 22.8*  MCV 87.7 86.3 85.9 89.1 87.4  PLT 190 171 152 148* 150   Basic Metabolic Panel: Recent Labs  Lab 02/16/23 1813 02/17/23 0559 02/19/23 0533  NA 130* 133* 132*  K 3.8 3.3* 4.4  CL 89* 94* 89*  CO2 24 26 24   GLUCOSE 535* 214* 371*  BUN 30* 34* 79*  CREATININE 6.87* 7.83* 11.25*  CALCIUM 8.9 8.5* 8.2*  PHOS  --  4.3 7.8*   GFR: Estimated Creatinine Clearance: 5.9 mL/min (A) (by C-G formula based on SCr of 11.25 mg/dL (H)). Liver Function Tests: Recent Labs  Lab 02/17/23 0559 02/19/23 0533  ALBUMIN 3.1* 2.8*   No results for input(s): "LIPASE", "AMYLASE" in the last 168 hours. No results for input(s): "AMMONIA" in the last 168 hours. Coagulation Profile: Recent Labs  Lab 02/16/23 1813  INR 1.1   Cardiac Enzymes: No results for input(s): "CKTOTAL", "CKMB", "CKMBINDEX", "TROPONINI" in the last 168 hours. BNP (last 3 results) No results for input(s): "PROBNP" in the last 8760 hours. HbA1C: No results for input(s): "HGBA1C" in the last 72 hours.  CBG: Recent Labs  Lab 02/20/23 0545 02/20/23 1115 02/20/23 1621 02/20/23 2009 02/21/23 0617  GLUCAP 196* 183* 223* 214* 199*   Lipid Profile: No results for input(s): "CHOL", "HDL", "LDLCALC", "TRIG", "CHOLHDL", "LDLDIRECT" in the last 72 hours. Thyroid Function Tests: No results for input(s): "TSH", "T4TOTAL", "FREET4", "T3FREE", "THYROIDAB" in the last 72 hours. Anemia Panel: No results for input(s): "VITAMINB12", "FOLATE", "FERRITIN", "TIBC", "IRON", "RETICCTPCT" in the last 72 hours. Sepsis Labs: No results for input(s): "PROCALCITON", "LATICACIDVEN" in the last 168 hours.  Recent Results (from the past 240 hours)  Surgical PCR screen     Status: None   Collection Time: 02/17/23  2:45 AM   Specimen: Nasal Mucosa; Nasal Swab  Result Value Ref Range Status   MRSA, PCR NEGATIVE NEGATIVE Final    Staphylococcus aureus NEGATIVE NEGATIVE Final    Comment: (NOTE) The Xpert SA Assay (FDA approved for NASAL specimens in patients 51 years of age and older), is one component of a comprehensive surveillance program. It is not intended to diagnose infection nor to guide or monitor treatment. Performed at [facility], [address], [place], [place] [address]          Radiology Studies: No results found.       Scheduled Meds:  sodium chloride   Intravenous Once   amLODipine  10 mg Oral Daily   bisacodyl  10 mg Rectal Once   Chlorhexidine Gluconate Cloth  6 each Topical Q0600   [START ON 02/22/2023] darbepoetin (ARANESP) injection - DIALYSIS  100 mcg Subcutaneous Q Thu-1800   docusate sodium  100 mg Oral BID   feeding supplement (NEPRO CARB STEADY)  237 mL Oral TID BM   heparin injection (subcutaneous)  5,000 Units Subcutaneous Q8H   insulin aspart  0-5 Units Subcutaneous QHS   insulin aspart  0-6  Units Subcutaneous TID WC   insulin aspart  3 Units Subcutaneous TID WC   insulin glargine-yfgn  12 Units Subcutaneous QHS   isosorbide mononitrate  30 mg Oral Once per day on Sunday Tuesday Thursday Saturday   methocarbamol  500 mg Oral Q12H   metoprolol succinate  50 mg Oral Daily   multivitamin  1 tablet Oral QHS   mupirocin ointment  1 Application Nasal BID   polyethylene glycol  17 g Oral Daily   rosuvastatin  40 mg Oral Daily   Continuous Infusions:     LOS: 5 days    Time spent: 35 minutes    Dorcas Carrow, MD [facility]

## 2023-02-21 NOTE — Progress Notes (Addendum)
[name] Progress Note   Subjective: Adjusted pain meds and muscle relaxer 02/20/2023. He is alert, oriented and appropriate today. Has hiccoughs but no pain.  HD 02/17 off schedule. Will have HD in AM on regular T,Th,S schedule. He denies SOB.   Objective Vitals:   02/20/23 1341 02/20/23 1951 02/21/23 0524 02/21/23 0744  BP: (!) 129/51 (!) 142/55 (!) 157/40 (!) 148/50  Pulse: 67 66 67 69  Resp: 16 16 16 18   Temp: 98.4 F (36.9 C) 98.1 F (36.7 C) 98.3 F (36.8 C) 98.6 F (37 C)  TempSrc: Oral   Oral  SpO2: 99% 95% 97% 100%  Weight:      Height:       Physical Exam General: Pleasant older male in NAD Heart:S1,S2 3/6 systolic M. No R/G Lungs: CTAB A/P Abdomen: NABS Extremities:Drsg R hip intact. Trace edema RU Thigh. No LLE edema.  Dialysis Access: L AVF + T/B  Additional Objective Labs: Basic Metabolic Panel: Recent Labs  Lab 02/16/23 1813 02/17/23 0559 02/19/23 0533  NA 130* 133* 132*  K 3.8 3.3* 4.4  CL 89* 94* 89*  CO2 24 26 24   GLUCOSE 535* 214* 371*  BUN 30* 34* 79*  CREATININE 6.87* 7.83* 11.25*  CALCIUM 8.9 8.5* 8.2*  PHOS  --  4.3 7.8*   Liver Function Tests: Recent Labs  Lab 02/17/23 0559 02/19/23 0533  ALBUMIN 3.1* 2.8*   No results for input(s): "LIPASE", "AMYLASE" in the last 168 hours. CBC: Recent Labs  Lab 02/16/23 1813 02/17/23 0552 02/18/23 0903 02/19/23 0533 02/20/23 0847  WBC 8.3 9.5 10.9* 9.4 9.8  NEUTROABS 6.1  --  8.9* 6.8 7.4  HGB 10.3* 8.6* 7.1* 6.4* 8.2*  HCT 28.5* 23.9* 19.5* 18.0* 22.8*  MCV 87.7 86.3 85.9 89.1 87.4  PLT 190 171 152 148* 150   Blood Culture    Component Value Date/Time   SDES BLOOD RIGHT ARM 06/05/2022 1759   SPECREQUEST  06/05/2022 1759    BOTTLES DRAWN AEROBIC AND ANAEROBIC Blood Culture results may not be optimal due to an excessive volume of blood received in culture bottles   CULT  06/05/2022 1759    NO GROWTH 5 DAYS Performed at [facility], [address]  [place], [place] [address]    REPTSTATUS 06/10/2022 FINAL 06/05/2022 1759    Cardiac Enzymes: No results for input(s): "CKTOTAL", "CKMB", "CKMBINDEX", "TROPONINI" in the last 168 hours. CBG: Recent Labs  Lab 02/20/23 0545 02/20/23 1115 02/20/23 1621 02/20/23 2009 02/21/23 0617  GLUCAP 196* 183* 223* 214* 199*   Iron Studies: No results for input(s): "IRON", "TIBC", "TRANSFERRIN", "FERRITIN" in the last 72 hours. @lablastinr3 @ Studies/Results: No results found. Medications:   sodium chloride   Intravenous Once   amLODipine  10 mg Oral Daily   Chlorhexidine Gluconate Cloth  6 each Topical Q0600   feeding supplement (NEPRO CARB STEADY)  237 mL Oral TID BM   heparin injection (subcutaneous)  5,000 Units Subcutaneous Q8H   insulin aspart  0-5 Units Subcutaneous QHS   insulin aspart  0-6 Units Subcutaneous TID WC   insulin aspart  3 Units Subcutaneous TID WC   insulin glargine-yfgn  12 Units Subcutaneous QHS   isosorbide mononitrate  30 mg Oral Once per day on Sunday Tuesday Thursday Saturday   methocarbamol  500 mg Oral Q12H   metoprolol succinate  50 mg Oral Daily   multivitamin  1 tablet Oral QHS   mupirocin ointment  1 Application Nasal BID  rosuvastatin  40 mg Oral Daily     Dialysis Orders:   TTS [facility] 4h  B450  81.1kg   2K bath  L AVF  Heparin none  - last HD 2/13, post wt 81.6kg, gets to dry wt, wt gain ~ 3kg - hectorol 2 mcg IV  - no esa - last Hb 10.5, ferritin 1605, pth 222    Renal-related home meds: - ferric citrate 3 ac tid - hydralazine 50 tid  - norvasc 10 - metoprolol xl 50 every day - others: imdur, sl ntg prn, statin, novolin insulin, asa   Assessment/Plan: R hip fracture - after a fall. S/P medullary nail procedure done today 2/15 by orthopedic surgery. PT now working with patient. ESRD - on HD TTS. Pt is stable, no vol or lab issues. HD was not done yesterday d/t high patient census. Patient prefers to have his treatment to be done in the  afternoon so he's not worn out when working with PT.Will attempt to schedule. He will resume T,Th,S Schedule 02/22/2023-do 1st shift.  HTN - BP's currently acceptable. Continue current medications Volume - Euvolemic on exam. UF as tolerated. Anemia of esrd - hgb 6.4 this AM. S/P 1 unit PRBCs 02/19/2023. Repeat HGB 8.2. Check iron panel next HD. ESA with HD tomorrow.  Secondary hyperparathyroidism - Ca and phos in range but alb is low, on Nepro. Cont binders w/ meals and IV vdra.     Zebulin Siegel H. Trenise Turay NP-C 02/21/2023, 7:58 AM  [employer] [phone]

## 2023-02-21 NOTE — Progress Notes (Signed)
Pt receives out-pt HD at [facility] on TTS 5:15 am chair time. Will assist as needed.   Olivia Canter Renal Navigator [phone]

## 2023-02-21 NOTE — TOC Progression Note (Signed)
Transition of Care [facility]) - Progression Note    Patient Details  Name: Justin [name] MRN: [id number] Date of Birth: 01-04-1950  Transition of Care [facility]) CM/SW Contact  Lorri Frederick, LCSW Phone Number: 02/21/2023, 11:38 AM  Clinical Narrative:   CSW received update yesterday from [facility]: both full at this time.  No bed offers from [facility] or [facility].  CSW spoke with [name] and discussed widening the search, her first choice would be SNF towards [place].  Referral sent out to wider group of options.     Expected Discharge Plan: Skilled Nursing Facility Barriers to Discharge: SNF Pending bed offer, Continued Medical Work up  Expected Discharge Plan and Services In-house Referral: Clinical Social Work   Post Acute Care Choice: Skilled Nursing Facility Living arrangements for the past 2 months: Single Family Home                                       Social Determinants of Health (SDOH) Interventions SDOH Screenings   Food Insecurity: No Food Insecurity (02/17/2023)  Housing: Low Risk  (02/17/2023)  Transportation Needs: No Transportation Needs (02/17/2023)  Utilities: Not At Risk (02/17/2023)  Financial Resource Strain: Low Risk  (07/13/2020)   Received from [facility], [facility]  Social Connections: Socially Integrated (02/17/2023)  Tobacco Use: Low Risk  (02/17/2023)    Readmission Risk Interventions     No data to display

## 2023-02-21 NOTE — Progress Notes (Signed)
.  Subjective: 4 Days Post-Op Procedure(s) (LRB): INTRAMEDULLARY (IM) NAIL INTERTROCHANTERIC (Right)  Patient seen and examined. Minimal pain at rest but significant pain with PT.   Activity level:  WBAT RLE Diet tolerance:  as tolerated Patient reports pain as 7 on 0-10 scale.    Objective: Vital signs in last 24 hours: Temp:  [98.1 F (36.7 C)-98.6 F (37 C)] 98.6 F (37 C) (02/19 0744) Pulse Rate:  [66-69] 69 (02/19 0744) Resp:  [16-18] 18 (02/19 0744) BP: (129-157)/(40-55) 148/50 (02/19 0744) SpO2:  [95 %-100 %] 100 % (02/19 0744)  Labs: Recent Labs    02/19/23 0533 02/20/23 0847  HGB 6.4* 8.2*   Recent Labs    02/19/23 0533 02/20/23 0847  WBC 9.4 9.8  RBC 2.02* 2.61*  HCT 18.0* 22.8*  PLT 148* 150   Recent Labs    02/19/23 0533  NA 132*  K 4.4  CL 89*  CO2 24  BUN 79*  CREATININE 11.25*  GLUCOSE 371*  CALCIUM 8.2*   No results for input(s): "LABPT", "INR" in the last 72 hours.  Physical Exam:  Neurologically intact Neurovascular intact Sensation intact distally Intact pulses distally Dorsiflexion/Plantar flexion intact Incision: dressing C/D/I Compartment soft  Assessment/Plan:  4 Days Post-Op Procedure(s) (LRB): INTRAMEDULLARY (IM) NAIL INTERTROCHANTERIC (Right)  WBAT RLE PT/OT Multimodal pain control with PRN pain medication preferring oral medicines.  DVT prophylaxis: Recommend HSQ until mobilizing and then consider transition to aspirin 81mg  BID once better mobilization. S/p 1u pRBCs for Hgn 6.4 on 2/18. Most recent 8.2. Likely due to acute blood loss anemia in setting of chronic  anemia, Continue to monitor CBC and transfuse as needed Rest of medical management/dialysis per primary DC pending SNF   Luci Bank 02/21/2023, 1:12 PM

## 2023-02-22 ENCOUNTER — Inpatient Hospital Stay (HOSPITAL_COMMUNITY): Payer: Medicare HMO

## 2023-02-22 DIAGNOSIS — S72141K Displaced intertrochanteric fracture of right femur, subsequent encounter for closed fracture with nonunion: Secondary | ICD-10-CM | POA: Diagnosis not present

## 2023-02-22 LAB — GLUCOSE, CAPILLARY
Glucose-Capillary: 149 mg/dL — ABNORMAL HIGH (ref 70–99)
Glucose-Capillary: 244 mg/dL — ABNORMAL HIGH (ref 70–99)
Glucose-Capillary: 215 mg/dL — ABNORMAL HIGH (ref 70–99)

## 2023-02-22 MED ORDER — HYDROMORPHONE HCL 1 MG/ML IJ SOLN
0.5000 mg | INTRAMUSCULAR | Status: DC | PRN
  Administered 2023-02-22 – 2023-02-23 (×2): 0.5 mg via INTRAVENOUS
  Filled 2023-02-22 (×2): qty 0.5

## 2023-02-22 MED ORDER — LACTULOSE 10 GM/15ML PO SOLN
20.0000 g | Freq: Two times a day (BID) | ORAL | Status: DC
  Administered 2023-02-22 – 2023-02-25 (×6): 20 g via ORAL
  Filled 2023-02-22 (×9): qty 30

## 2023-02-22 MED ORDER — BISACODYL 10 MG RE SUPP: 10.0000 mg | Freq: Once | RECTAL | Status: DC

## 2023-02-22 NOTE — Progress Notes (Addendum)
Physical Therapy Treatment Patient Details Name: Justin [name] MRN: [id number] DOB: 05-17-1950 Today's Date: 02/22/2023   History of Present Illness 73 year old admitted 2/14 after fall at home with right hip fracture. IM nail right femur 2/14. PMH: ESRD on TTS dialysis, CAD, type 2 diabetes on insulin, hypertension hyperlipidemia.    PT Comments  PTA called back to room by pt significant other as RN and NT had not yet been able to respond to call bell to get him back to bed. Pt tolerated sitting in chair ~15 mins prior to requesting back to bed. Pt needing increased assist, up to +2 maxA for sit<>stand and max to totalA for sit>supine due to increased fatigue in second session, pain more severe and RN reminded he is still requesting pain meds (pt asking for Tylenol). Per Justin [name] (girlfriend), he only ate a couple bites from his lunch tray. Pt will continue to benefit from skilled rehab in a post acute setting to maximize functional gains before returning home.     If plan is discharge home, recommend the following: Assistance with cooking/housework;Assist for transportation;Help with stairs or ramp for entrance;Two people to help with walking and/or transfers;A lot of help with bathing/dressing/bathroom;Supervision due to cognitive status   Can travel by private vehicle     No  Equipment Recommendations  BSC/3in1;Wheelchair (measurements PT);Wheelchair cushion (measurements PT);Hospital bed;Other (comment) (pt also has transport chair but would need manual WC if home by himself as his spouse works)    Recommendations for [employer]       Precautions / Restrictions Precautions Precautions: Fall Recall of Precautions/Restrictions: Impaired Precaution/Restrictions Comments: Waxing/waning orientation Restrictions [employer] Bearing Restrictions Per Provider Order: Yes RLE Weight Bearing Per Provider Order: Weight bearing as tolerated Other Position/Activity Restrictions: limited by pain      Mobility  Bed Mobility Overal bed mobility: Needs Assistance Bed Mobility: Sit to Supine     Supine to sit: Max assist, HOB elevated, Used rails Sit to supine: Used rails, +2 for physical assistance, Total assist   General bed mobility comments: Pt more lethargic/fatigued upon return to bed and needs increased assist getting from EOB>supine, NT at shoulders and PTA assist with BLE    Transfers Overall transfer level: Needs assistance Equipment used: Ambulation equipment used Transfers: Sit to/from Stand, Bed to chair/wheelchair/BSC Sit to Stand: +2 physical assistance, From elevated surface, Via lift equipment, Max assist           General transfer comment: from chair>stedy>EOB with increased lift assist from low chair and to bed. Transfer via Lift Equipment: Stedy  Ambulation/Gait               General Gait Details: Pt unable to weight shift in stance, Stedy for chair>EOB   Stairs             Wheelchair Mobility     Tilt Bed    Modified Rankin (Stroke Patients Only)       Balance Overall balance assessment: Needs assistance Sitting-balance support: Feet supported, Bilateral upper extremity supported Sitting balance-Leahy Scale: Poor Sitting balance - Comments: improved seated tolerance but needs BUE support to maintain upright   Standing balance support: Bilateral upper extremity supported, Reliant on assistive device for balance Standing balance-Leahy Scale: Poor Standing balance comment: +2 and Stedy                            Communication Communication Communication: No apparent difficulties  Cognition Arousal: Lethargic Behavior  During Therapy: Anxious   PT - Cognitive impairments: Sequencing, Problem solving, Initiation, Safety/Judgement, Attention                       PT - Cognition Comments: Pt more lethargic due to fatigue after sitting in chair ~32mins and per girlfriend she was worried about his safety, so  asked PTA to return to help him back sooner than anticipated. Per significant other, he only took 1-2 bites of his meal. Following commands: Intact      Cueing Cueing Techniques: Verbal cues, Tactile cues, Gestural cues, Visual cues  Exercises General Exercises - Lower Extremity Ankle Circles/Pumps: AROM, Both, 10 reps, Supine Long Arc Quad: AAROM, 5 reps, Right, Seated Heel Slides: AAROM, Both, 5 reps, Supine, AROM (AROM on LLE with mild c/o LLE pain) Hip ABduction/ADduction: AAROM, AROM, Both, 5 reps, Supine (AA on RLE; AROM on LLE)    General Comments General comments (skin integrity, edema, etc.): pt RLE elevated, RN notified he requests pain meds still (has been waiting almost 2 hours on pain meds- now that he is agreeable)      Pertinent Vitals/Pain Pain Assessment Pain Assessment: Faces Pain Score: 7  Faces Pain Scale: Hurts whole lot Pain Location: R hip with movement and weight bearing. Pain Descriptors / Indicators: Grimacing, Guarding, Sharp, Moaning, Pounding Pain Intervention(s): Limited activity within patient's tolerance, Monitored during session, Patient requesting pain meds-RN notified, Repositioned (pt more anxious as pain meds were requested at 4:30pm and had not been given by 5:55, RN x2 asked to bring them to room but did not yet have time to bring them.)    Home Living                          Prior Function            PT Goals (current goals can now be found in the care plan section) Acute Rehab PT Goals Patient Stated Goal: to go home (if safe, per long time gf he needs to be mobilizing well without assist as she works 2 jobs) PT Goal Formulation: With patient Time For Goal Achievement: 03/03/23 Progress towards PT goals: Progressing toward goals    Frequency    Min 1X/week      PT Plan      Co-evaluation              AM-PAC PT "6 Clicks" Mobility   Outcome Measure  Help needed turning from your back to your side while in a  flat bed without using bedrails?: A Lot Help needed moving from lying on your back to sitting on the side of a flat bed without using bedrails?: A Lot Help needed moving to and from a bed to a chair (including a wheelchair)?: Total Help needed standing up from a chair using your arms (e.g., wheelchair or bedside chair)?: A Lot Help needed to walk in hospital room?: Total Help needed climbing 3-5 steps with a railing? : Total 6 Click Score: 9    End of Session Equipment Utilized During Treatment: Gait belt (vss on RA) Activity Tolerance: Patient limited by pain;Patient limited by lethargy Patient left: with family/visitor present;in bed;with call bell/phone within reach;with bed alarm set;Other (comment) (RLE elevated, sig other leaving to go home) Nurse Communication: Mobility status;Need for lift equipment;Patient requests pain meds;Other (comment) (pt asking for Tylenol still) PT Visit Diagnosis: Unsteadiness on feet (R26.81);Muscle weakness (generalized) (M62.81);Pain Pain - Right/Left: Right  Pain - part of body: Leg     Time: 6962-9528 PT Time Calculation (min) (ACUTE ONLY): 12 min  Charges:     $Therapeutic Activity: 8-22 mins  PT General Charges $$ ACUTE PT VISIT: 1 Visit                     Tamu Golz P., PTA Acute Rehabilitation Services Secure Chat Preferred 9a-5:30pm Office: [phone]    Angus Palms 02/22/2023, 6:39 PM

## 2023-02-22 NOTE — TOC Progression Note (Addendum)
Transition of Care [facility]) - Progression Note    Patient Details  Name: Justin [name] MRN: [id number] Date of Birth: 12-17-1950  [facility]) CM/SW Contact  Lorri Frederick, LCSW Phone Number: 02/22/2023, 11:13 AM  Clinical Narrative:   CSW spoke with [name], pt sig other, regarding SNF offers--she would like to accept offer at [facility], [place].  CSW confirmed with [facility]--they can accommodate HD, would prefer location in [place].  Preference is MWF but could do Saturday if needed.    [facility]al updated and will work on HD site for pt time at [facility].  1530: SNF auth request submitted in Navi and approved: [id number], 5 days: 2/21-2/25.   Expected Discharge Plan: Skilled Nursing Facility Barriers to Discharge: SNF Pending bed offer, Continued Medical Work up  Expected Discharge Plan and Services In-house Referral: Clinical Social Work   Post Acute Care Choice: Skilled Nursing Facility Living arrangements for the past 2 months: Single Family Home                                       Social Determinants of Health (SDOH) Interventions SDOH Screenings   Food Insecurity: No Food Insecurity (02/17/2023)  Housing: Low Risk  (02/17/2023)  Transportation Needs: No Transportation Needs (02/17/2023)  Utilities: Not At Risk (02/17/2023)  Financial Resource Strain: Low Risk  (07/13/2020)   Received from [facility], [facility]  Social Connections: Socially Integrated (02/17/2023)  Tobacco Use: Low Risk  (02/17/2023)    Readmission Risk Interventions     No data to display

## 2023-02-22 NOTE — Progress Notes (Signed)
[facility] Progress Note   Subjective: HD today. More alert, talking clearer. Looking for SNF placement for rehab. UF as tolerated    Objective Vitals:   02/22/23 1230 02/22/23 1302 02/22/23 1311 02/22/23 1428  BP: (!) 107/53 (!) 111/56 (!) 120/55 (!) 149/55  Pulse: 76 76 71 77  Resp: (!) 21 (!) 25 17 18   Temp:    98.8 F (37.1 C)  TempSrc:    Oral  SpO2: 100% 100% 100% 100%  Weight:      Height:       Physical Exam General: Pleasant older male in NAD Heart:S1,S2 3/6 systolic M. No R/G Lungs: CTAB A/P Abdomen: NABS Extremities:Drsg R hip intact. Trace edema RU Thigh. No LLE edema.  Dialysis Access: L AVF + T/B  Additional Objective Labs: Basic Metabolic Panel: Recent Labs  Lab 02/16/23 1813 02/17/23 0559 02/19/23 0533  NA 130* 133* 132*  K 3.8 3.3* 4.4  CL 89* 94* 89*  CO2 24 26 24   GLUCOSE 535* 214* 371*  BUN 30* 34* 79*  CREATININE 6.87* 7.83* 11.25*  CALCIUM 8.9 8.5* 8.2*  PHOS  --  4.3 7.8*   Liver Function Tests: Recent Labs  Lab 02/17/23 0559 02/19/23 0533  ALBUMIN 3.1* 2.8*   No results for input(s): "LIPASE", "AMYLASE" in the last 168 hours. CBC: Recent Labs  Lab 02/16/23 1813 02/17/23 0552 02/18/23 0903 02/19/23 0533 02/20/23 0847  WBC 8.3 9.5 10.9* 9.4 9.8  NEUTROABS 6.1  --  8.9* 6.8 7.4  HGB 10.3* 8.6* 7.1* 6.4* 8.2*  HCT 28.5* 23.9* 19.5* 18.0* 22.8*  MCV 87.7 86.3 85.9 89.1 87.4  PLT 190 171 152 148* 150   Blood Culture    Component Value Date/Time   SDES BLOOD RIGHT ARM 06/05/2022 1759   SPECREQUEST  06/05/2022 1759    BOTTLES DRAWN AEROBIC AND ANAEROBIC Blood Culture results may not be optimal due to an excessive volume of blood received in culture bottles   CULT  06/05/2022 1759    NO GROWTH 5 DAYS Performed at [facility], [address], [place], [place] [address]    REPTSTATUS 06/10/2022 FINAL 06/05/2022 1759    Cardiac Enzymes: No results for input(s): "CKTOTAL", "CKMB", "CKMBINDEX",  "TROPONINI" in the last 168 hours. CBG: Recent Labs  Lab 02/21/23 0617 02/21/23 1144 02/21/23 1637 02/21/23 2042 02/22/23 1428  GLUCAP 199* 189* 205* 208* 149*   Iron Studies: No results for input(s): "IRON", "TIBC", "TRANSFERRIN", "FERRITIN" in the last 72 hours. @lablastinr3 @ Studies/Results: DG CHEST PORT 1 VIEW Result Date: 02/22/2023 CLINICAL DATA:  Shortness of breath.  Hypertension. EXAM: PORTABLE CHEST 1 VIEW COMPARISON:  Chest radiograph dated 02/16/2023. FINDINGS: No focal consolidation, pleural effusion, or pneumothorax. Mild cardiomegaly. Atherosclerotic calcification of the aorta. No acute osseous pathology. IMPRESSION: 1. No active disease. 2. Mild cardiomegaly. Electronically Signed   By: Elgie Collard M.D.   On: 02/22/2023 14:52   Medications:   sodium chloride   Intravenous Once   amLODipine  10 mg Oral Daily   Chlorhexidine Gluconate Cloth  6 each Topical Q0600   darbepoetin (ARANESP) injection - DIALYSIS  100 mcg Subcutaneous Q Thu-1800   docusate sodium  100 mg Oral BID   feeding supplement (NEPRO CARB STEADY)  237 mL Oral TID BM   ferric citrate  630 mg Oral TID WC   heparin injection (subcutaneous)  5,000 Units Subcutaneous Q8H   insulin aspart  0-5 Units Subcutaneous QHS   insulin aspart  0-6 Units Subcutaneous  TID WC   insulin aspart  3 Units Subcutaneous TID WC   insulin glargine-yfgn  12 Units Subcutaneous QHS   isosorbide mononitrate  30 mg Oral Once per day on Sunday Tuesday Thursday Saturday   lactulose  20 g Oral BID   methocarbamol  500 mg Oral Q12H   metoprolol succinate  50 mg Oral Daily   multivitamin  1 tablet Oral QHS   rosuvastatin  40 mg Oral Daily     Dialysis Orders:   TTS [place] 4h  B450  81.1kg   2K bath  L AVF  Heparin none  - last HD 2/13, post wt 81.6kg, gets to dry wt, wt gain ~ 3kg - hectorol 2 mcg IV  - no esa - last Hb 10.5, ferritin 1605, pth 222    Renal-related home meds: - ferric citrate 3 ac tid -  hydralazine 50 tid  - norvasc 10 - metoprolol xl 50 every day - others: imdur, sl ntg prn, statin, novolin insulin, asa   Assessment/Plan: R hip fracture - after a fall. S/P medullary nail procedure done today 2/15 by orthopedic surgery. PT now working with patient. ESRD - on HD TTS. Pt is stable, no vol or lab issues. HD was not done yesterday d/t high patient census. Patient prefers to have his treatment to be done in the afternoon so he's not worn out when working with PT.Will attempt to schedule. He will resume T,Th,S Schedule 02/22/2023-do 1st shift. Next HD 02/24/2020 HTN - BP's currently acceptable. Continue current medications Volume - Euvolemic on exam. UF as tolerated. Anemia of esrd -  Repeat HGB 8.2. Check iron panel next HD. ESA with HD today Secondary hyperparathyroidism - Ca and phos in range but alb is low, on Nepro. Cont binders w/ meals and IV vdra.   Shuntay Everetts H. Lene Mckay NP-C 02/22/2023, 4:46 PM  [employer] [phone]

## 2023-02-22 NOTE — Progress Notes (Addendum)
   02/22/23 1311  Vitals  Pulse Rate 71  Resp 17  BP (!) 120/55  SpO2 100 %  O2 Device Nasal Cannula  Oxygen Therapy  O2 Flow Rate (L/min) 2 L/min  Patient Activity (if Appropriate) In bed  Pulse Oximetry Type Continuous  Post Treatment  Dialyzer Clearance Lightly streaked  Liters Processed 96  Fluid Removed (mL) 3500 mL  Tolerated HD Treatment Yes  AVG/AVF Arterial Site Held (minutes) 8 minutes  AVG/AVF Venous Site Held (minutes) 8 minutes   Received patient in bed to unit.  Alert and oriented.  Informed consent signed and in chart.   TX duration:4.0 HOURS Patient started to c/o SOB towards the end of HD tx, 2 liters N/C placed, no increase work of breathing noted, RR are unlabored, Spo2 100% at this time. NP Annawan Sink made aware and came to bedside, no new orders given at this this. Patient was reassessed and still c/o of SOB, RRT called at 1230. RR RN Council Mechanic came to bedside and assessed patient. Dr. Oralia Manis made aware and primary Nurse. Dr. Cathie Beams gave order for CXR, Primary RN aware. Transported back to the room with Clinical research associate and transporter. Alert, without acute distress.  Hand-off given to patient's nurse at bedside, patient denies SOB at this time.  Access used: LAVF Access issues: None  Total UF removed: Medication(s) given: See MAR    Laqueta Due, RN [facility]

## 2023-02-22 NOTE — Progress Notes (Signed)
Contacted by CSW regarding pt needing clinic placement in [place] while pt at snf for rehab. Since pt is a [facility] pt, referral submitted to [facility] admissions with request for [facility] [employer] clinic. Will await determination.   Olivia Canter Renal Navigator [phone]

## 2023-02-22 NOTE — Progress Notes (Signed)
Physical Therapy Treatment Patient Details Name: Justin [name] MRN: [id number] DOB: 1950-02-12 Today's Date: 02/22/2023   History of Present Illness 73 year old admitted 2/14 after fall at home with right hip fracture. IM nail right femur 2/14. PMH: ESRD on TTS dialysis, CAD, type 2 diabetes on insulin, hypertension hyperlipidemia.    PT Comments  Pt received in supine after return from HD unit, pt agreeable to therapy session with plan to work on transfer training due to pt needing BM after constipation meds given by RN. Pt with improved technique with transfers this date and improved initiation and slightly less guarding. Pt needing up to +2 modA for sit<>stand safety from elevated bed and BSC surfaces with Stedy platform lift. Pt still c/o pain with 50% weightbearing and not able to perform pre-gait tasks but able to stand fully upright today while nurse tech assisting with posterior peri-care after toileting. Increased time needed due to bowel urgency. Pt with large BM on BSC, RN notified, pt up in chair (feet down per his preference) with chair alarm on for safety at end of session, sig other present to encourage him to eat his lunch. Pt will continue to benefit from skilled rehab in a post acute setting to maximize functional gains before returning home.    If plan is discharge home, recommend the following: Assistance with cooking/housework;Assist for transportation;Help with stairs or ramp for entrance;Two people to help with walking and/or transfers;A lot of help with bathing/dressing/bathroom;Supervision due to cognitive status   Can travel by private vehicle     No  Equipment Recommendations  BSC/3in1;Wheelchair (measurements PT);Wheelchair cushion (measurements PT);Hospital bed;Other (comment) (pt also has transport chair but would need manual WC if home by himself as his spouse works)    Recommendations for [employer]       Precautions / Restrictions Precautions Precautions:  Fall Recall of Precautions/Restrictions: Impaired Precaution/Restrictions Comments: Waxing/waning orientation Restrictions [employer] Bearing Restrictions Per Provider Order: Yes RLE Weight Bearing Per Provider Order: Weight bearing as tolerated Other Position/Activity Restrictions: limited by pain     Mobility  Bed Mobility Overal bed mobility: Needs Assistance Bed Mobility: Supine to Sit     Supine to sit: Max assist, HOB elevated, Used rails     General bed mobility comments: pt with improved initiation today getting to L EOB, able to move LLE without cues but needs a lot of assist to adduct RLE toward L EOB; cues for pursed-lip breathing. maxA for trunk elevation with pt assisting well with bed rail.    Transfers Overall transfer level: Needs assistance Equipment used: Ambulation equipment used Transfers: Sit to/from Stand, Bed to chair/wheelchair/BSC Sit to Stand: +2 physical assistance, From elevated surface, Via lift equipment, Mod assist           General transfer comment: From elevated hospital bed surface to [facility] with +2 modA and from Stedy flaps to [facility] with modA (+2 more for safety than lift assist from elevated surfaces) Pt able to WB well through both limbs with some attempt at weight shifting but unable to tolerate single leg stance today on RLE so totalA to transfer to [place] and EOB with Stedy. Transfer via Lift Equipment: Stedy  Ambulation/Gait               General Gait Details: Pt unable to weight shift in stance, Stedy for OOB to Dealer     Tilt Bed  Modified Rankin (Stroke Patients Only)       Balance Overall balance assessment: Needs assistance Sitting-balance support: Feet supported, Bilateral upper extremity supported Sitting balance-Leahy Scale: Poor Sitting balance - Comments: improved seated tolerance but needs BUE support to maintain upright   Standing balance support: Bilateral upper  extremity supported, Reliant on assistive device for balance Standing balance-Leahy Scale: Poor Standing balance comment: +2 safety to static stand in [place], not able to march in standing due to RLE pain                            Communication Communication Communication: No apparent difficulties  Cognition Arousal: Alert Behavior During Therapy: Flat affect   PT - Cognitive impairments: Sequencing, Problem solving, Initiation, Safety/Judgement, Attention                       PT - Cognition Comments: Per girlfriend he was a bit more confused when he got back from dialysis but now appropriate and following 1-step commands well. Pt able to initiate movements better today and eager to work on OOB to [employer]. Pt with some anticipatory pain but less labile when expressing discomfort today. Following commands: Intact      Cueing Cueing Techniques: Verbal cues, Tactile cues, Gestural cues, Visual cues  Exercises General Exercises - Lower Extremity Ankle Circles/Pumps: AROM, Both, 10 reps, Supine Long Arc Quad: AAROM, 5 reps, Right, Seated Heel Slides: AAROM, Both, 5 reps, Supine, AROM (AROM on LLE with mild c/o LLE pain) Hip ABduction/ADduction: AAROM, AROM, Both, 5 reps, Supine (AA on RLE; AROM on LLE)    General Comments General comments (skin integrity, edema, etc.): pt with very large soft BM, dark in color (per girlfriend this is normal due to his medication), RN notified.  SpO2 98% on RA in supine prior to OOB and 100% sitting on BSC. small mepilex over his L elbow due to chronic L elbow pain (per pt more than a decade it has been painful)      Pertinent Vitals/Pain Pain Assessment Pain Assessment: 0-10 Pain Score: 7  Faces Pain Scale: Hurts whole lot Pain Location: R hip with movement and weight bearing. Pain Descriptors / Indicators: Grimacing, Guarding, Sharp, Moaning, Pounding Pain Intervention(s): Limited activity within patient's tolerance, Monitored  during session, Repositioned, Patient requesting pain meds-RN notified    Home Living                          Prior Function            PT Goals (current goals can now be found in the care plan section) Acute Rehab PT Goals Patient Stated Goal: to go home (if safe, per long time gf he needs to be mobilizing well without assist as she works 2 jobs) PT Goal Formulation: With patient Time For Goal Achievement: 03/03/23 Progress towards PT goals: Progressing toward goals    Frequency    Min 1X/week      PT Plan      Co-evaluation              AM-PAC PT "6 Clicks" Mobility   Outcome Measure  Help needed turning from your back to your side while in a flat bed without using bedrails?: A Lot Help needed moving from lying on your back to sitting on the side of a flat bed without using bedrails?: A Lot Help needed moving to  and from a bed to a chair (including a wheelchair)?: Total Help needed standing up from a chair using your arms (e.g., wheelchair or bedside chair)?: A Lot Help needed to walk in hospital room?: Total Help needed climbing 3-5 steps with a railing? : Total 6 Click Score: 9    End of Session Equipment Utilized During Treatment: Gait belt;Oxygen (weaned to RA prior to EOB/OOB) Activity Tolerance: Patient tolerated treatment well;Patient limited by pain Patient left: in chair;with chair alarm set;with family/visitor present;with call bell/phone within reach Nurse Communication: Mobility status;Need for lift equipment;Patient requests pain meds;Other (comment) (pt asking for Tylenol) PT Visit Diagnosis: Unsteadiness on feet (R26.81);Muscle weakness (generalized) (M62.81);Pain Pain - Right/Left: Right Pain - part of body: Leg     Time: 4098-1191 PT Time Calculation (min) (ACUTE ONLY): 61 min  Charges:    $Therapeutic Exercise: 8-22 mins $Therapeutic Activity: 23-37 mins $Self Care/Home Management: 8-22 PT General Charges $$ ACUTE PT  VISIT: 1 Visit                     Arthur Aydelotte P., PTA Acute Rehabilitation Services Secure Chat Preferred 9a-5:30pm Office: [phone]    Dorathy Kinsman [facility] 02/22/2023, 6:24 PM

## 2023-02-22 NOTE — Progress Notes (Signed)
HD RN called because patient complained of shortness of breath.   BP 107/53  HR 76  RR 21  O2 sat 100% on 2L [place].  Patient is alert and answering questions easily.  He does complain of some shortness of breath when asked.  No increase work of breathing, no distress.  Lung sounds decreased bases.  Recommended PCXR & IS

## 2023-02-22 NOTE — Progress Notes (Signed)
PROGRESS NOTE    Justin [name]  ZOX:[id number] DOB: Oct 15, 1950 DOA: 02/16/2023 PCP: Adrian Prince, MD    Brief Narrative:  73 year old with ESRD on TTS dialysis, CAD, type 2 diabetes on insulin, hypertension hyperlipidemia who was working at home, lost his balance and fell back landing on the ground, right hip pain.  In the emergency room hemodynamically stable.  Found to have right hip fracture. Underwent ORIF. 2/17, 1 unit PRBC transfusion for hemoglobin 6.4. 2/18, overnight with some delirium after hemodialysis.  Clinically improving.  Needs a skilled nursing facility with dialysis transport.   Subjective:  Patient seen and examined.  He is getting hemodialysis now.  Sleepy.  Not used any ativan since yesterday .  He still reported some confusion at middle of the night but patient is mostly quiet composed other times. Nursing reported no meaningful bowel movement even after Dulcolax suppository and MiraLAX yesterday.  Will give him lactulose once he comes back from dialysis.   Assessment & Plan:   Close traumatic right intertrochanteric hip fracture: Status post ORIF, IM nail right femur. Dr Hulda Humphrey 2/15 Weightbearing as tolerated Adequate pain control with oral opiates.  Prefers tramadol. Continue to work with PT OT. DVT prophylaxis, Subcu heparin while in the hospital.  Aspirin 81 mg twice daily after starting mobilizing.  ESRD on hemodialysis: TTS schedule.  Getting intermittent dialysis in the hospital.  Trying to schedule dialysis with skilled nursing facility. Dialysis today.   Type 2 diabetes with hyperglycemia: Blood sugars elevated.  Increased dose of Lantus to 12 units nightly, started prandial insulin and sliding scale insulin.  Will monitor today and keep on same doses.   Anemia of chronic disease, acute on chronic anemia: Hemoglobin 6.4.  1 unit PRBC transfusion with appropriate response.  Hemoglobin 8.2.  Recheck tomorrow.  Chronic medical issues including Coronary  artery disease, stable and currently on aspirin, Toprol-XL, Imdur and statin. Hypertension, as above.  Stable. Hyperlipidemia, on rosuvastatin.  Hospital-acquired delirium: Multifactorial.  Currently without neurological deficits.  May need additional dialysis. Fall precautions.  Delirium precautions. Symptomatic management. Bowel management.  Constipation: Dose of MiraLAX and Dulcolax suppository did not help mush. Start lactulose. scheduled stool softener on discharge.   DVT prophylaxis: heparin injection 5,000 Units Start: 02/20/23 1415 SCDs Start: 02/16/23 2147   Code Status: Full code Family Communication: None today. Disposition Plan: Status is: Inpatient Remains inpatient appropriate because: Will need a skilled nursing facility.  Should expect discharge next 24 to 48 hours.     Consultants:  Orthopedics Nephrology for dialysis need  Procedures:  Right femoral IM nailing  Antimicrobials:  Perioperative     Objective: Vitals:   02/22/23 0829 02/22/23 0859 02/22/23 0930 02/22/23 1000  BP: (!) 157/51 (!) 148/53 (!) 156/51 128/60  Pulse: 71 67 69 68  Resp: 20 17 20 19   Temp:      TempSrc:      SpO2: 95% 97% 99% 99%  Weight:      Height:        Intake/Output Summary (Last 24 hours) at 02/22/2023 1010 Last data filed at 02/21/2023 2000 Gross per 24 hour  Intake 60 ml  Output 50 ml  Net 10 ml     Filed Weights   02/19/23 1340 02/19/23 1733 02/22/23 0817  Weight: 84.1 kg 82.6 kg 84 kg    Examination:  General: Getting hemodialysis.  Looks comfortable.  Sleepy today. Cardiovascular: S1-S2 normal.  Regular rate rhythm. Respiratory: Bilateral clear.  On room air. Gastrointestinal: Soft  and nontender.  Bowel sound present. Ext: No swelling or edema.  Left arm AV fistula getting hemodialysis. Right lateral thigh incisions clean and dry.      Data Reviewed: I have personally reviewed following labs and imaging studies  CBC: Recent Labs  Lab  02/16/23 1813 02/17/23 0552 02/18/23 0903 02/19/23 0533 02/20/23 0847  WBC 8.3 9.5 10.9* 9.4 9.8  NEUTROABS 6.1  --  8.9* 6.8 7.4  HGB 10.3* 8.6* 7.1* 6.4* 8.2*  HCT 28.5* 23.9* 19.5* 18.0* 22.8*  MCV 87.7 86.3 85.9 89.1 87.4  PLT 190 171 152 148* 150   Basic Metabolic Panel: Recent Labs  Lab 02/16/23 1813 02/17/23 0559 02/19/23 0533  NA 130* 133* 132*  K 3.8 3.3* 4.4  CL 89* 94* 89*  CO2 24 26 24   GLUCOSE 535* 214* 371*  BUN 30* 34* 79*  CREATININE 6.87* 7.83* 11.25*  CALCIUM 8.9 8.5* 8.2*  PHOS  --  4.3 7.8*   GFR: Estimated Creatinine Clearance: 5.9 mL/min (A) (by C-G formula based on SCr of 11.25 mg/dL (H)). Liver Function Tests: Recent Labs  Lab 02/17/23 0559 02/19/23 0533  ALBUMIN 3.1* 2.8*   No results for input(s): "LIPASE", "AMYLASE" in the last 168 hours. No results for input(s): "AMMONIA" in the last 168 hours. Coagulation Profile: Recent Labs  Lab 02/16/23 1813  INR 1.1   Cardiac Enzymes: No results for input(s): "CKTOTAL", "CKMB", "CKMBINDEX", "TROPONINI" in the last 168 hours. BNP (last 3 results) No results for input(s): "PROBNP" in the last 8760 hours. HbA1C: No results for input(s): "HGBA1C" in the last 72 hours.  CBG: Recent Labs  Lab 02/20/23 2009 02/21/23 0617 02/21/23 1144 02/21/23 1637 02/21/23 2042  GLUCAP 214* 199* 189* 205* 208*   Lipid Profile: No results for input(s): "CHOL", "HDL", "LDLCALC", "TRIG", "CHOLHDL", "LDLDIRECT" in the last 72 hours. Thyroid Function Tests: No results for input(s): "TSH", "T4TOTAL", "FREET4", "T3FREE", "THYROIDAB" in the last 72 hours. Anemia Panel: No results for input(s): "VITAMINB12", "FOLATE", "FERRITIN", "TIBC", "IRON", "RETICCTPCT" in the last 72 hours. Sepsis Labs: No results for input(s): "PROCALCITON", "LATICACIDVEN" in the last 168 hours.  Recent Results (from the past 240 hours)  Surgical PCR screen     Status: None   Collection Time: 02/17/23  2:45 AM   Specimen: Nasal  Mucosa; Nasal Swab  Result Value Ref Range Status   MRSA, PCR NEGATIVE NEGATIVE Final   Staphylococcus aureus NEGATIVE NEGATIVE Final    Comment: (NOTE) The Xpert SA Assay (FDA approved for NASAL specimens in patients 50 years of age and older), is one component of a comprehensive surveillance program. It is not intended to diagnose infection nor to guide or monitor treatment. Performed at [facility], [address] [place], [place] [address]          Radiology Studies: No results found.       Scheduled Meds:  sodium chloride   Intravenous Once   amLODipine  10 mg Oral Daily   Chlorhexidine Gluconate Cloth  6 each Topical Q0600   darbepoetin (ARANESP) injection - DIALYSIS  100 mcg Subcutaneous Q Thu-1800   docusate sodium  100 mg Oral BID   feeding supplement (NEPRO CARB STEADY)  237 mL Oral TID BM   ferric citrate  630 mg Oral TID WC   heparin injection (subcutaneous)  5,000 Units Subcutaneous Q8H   insulin aspart  0-5 Units Subcutaneous QHS   insulin aspart  0-6 Units Subcutaneous TID WC   insulin aspart  3  Units Subcutaneous TID WC   insulin glargine-yfgn  12 Units Subcutaneous QHS   isosorbide mononitrate  30 mg Oral Once per day on Sunday Tuesday Thursday Saturday   methocarbamol  500 mg Oral Q12H   metoprolol succinate  50 mg Oral Daily   multivitamin  1 tablet Oral QHS   polyethylene glycol  17 g Oral Daily   rosuvastatin  40 mg Oral Daily   Continuous Infusions:     LOS: 6 days    Time spent: 35 minutes    Dorcas Carrow, MD [facility]

## 2023-02-23 ENCOUNTER — Inpatient Hospital Stay (HOSPITAL_COMMUNITY): Payer: Medicare HMO

## 2023-02-23 ENCOUNTER — Encounter (HOSPITAL_COMMUNITY): Payer: Self-pay | Admitting: Internal Medicine

## 2023-02-23 DIAGNOSIS — F05 Delirium due to known physiological condition: Secondary | ICD-10-CM

## 2023-02-23 DIAGNOSIS — S72141K Displaced intertrochanteric fracture of right femur, subsequent encounter for closed fracture with nonunion: Secondary | ICD-10-CM | POA: Diagnosis not present

## 2023-02-23 DIAGNOSIS — D62 Acute posthemorrhagic anemia: Secondary | ICD-10-CM

## 2023-02-23 DIAGNOSIS — Z992 Dependence on renal dialysis: Secondary | ICD-10-CM

## 2023-02-23 DIAGNOSIS — R09A2 Foreign body sensation, throat: Secondary | ICD-10-CM

## 2023-02-23 DIAGNOSIS — I10 Essential (primary) hypertension: Secondary | ICD-10-CM | POA: Diagnosis not present

## 2023-02-23 DIAGNOSIS — N186 End stage renal disease: Secondary | ICD-10-CM | POA: Diagnosis not present

## 2023-02-23 DIAGNOSIS — I350 Nonrheumatic aortic (valve) stenosis: Secondary | ICD-10-CM

## 2023-02-23 HISTORY — DX: Delirium due to known physiological condition: F05

## 2023-02-23 HISTORY — DX: Foreign body sensation, throat: R09.A2

## 2023-02-23 HISTORY — DX: Acute posthemorrhagic anemia: D62

## 2023-02-23 LAB — GLUCOSE, CAPILLARY
Glucose-Capillary: 167 mg/dL — ABNORMAL HIGH (ref 70–99)
Glucose-Capillary: 275 mg/dL — ABNORMAL HIGH (ref 70–99)
Glucose-Capillary: 275 mg/dL — ABNORMAL HIGH (ref 70–99)
Glucose-Capillary: 224 mg/dL — ABNORMAL HIGH (ref 70–99)

## 2023-02-23 MED ORDER — METOCLOPRAMIDE HCL 10 MG PO TABS: 10.0000 mg | ORAL_TABLET | Freq: Four times a day (QID) | ORAL | Status: DC | PRN

## 2023-02-23 MED ORDER — HYDRALAZINE HCL 25 MG PO TABS: 50.0000 mg | ORAL_TABLET | ORAL | Status: DC

## 2023-02-23 MED ORDER — INSULIN GLARGINE-YFGN 100 UNIT/ML ~~LOC~~ SOLN: 12.0000 [IU] | Freq: Every day | SUBCUTANEOUS | Status: DC

## 2023-02-23 MED ORDER — SIMETHICONE 80 MG PO CHEW: 160.0000 mg | CHEWABLE_TABLET | Freq: Four times a day (QID) | ORAL | 0 refills | Status: DC | PRN

## 2023-02-23 MED ORDER — INSULIN ASPART 100 UNIT/ML IJ SOLN
0.0000 [IU] | Freq: Three times a day (TID) | INTRAMUSCULAR | Status: DC
Start: 2023-02-23 — End: 2023-07-19

## 2023-02-23 MED ORDER — METHOCARBAMOL 500 MG PO TABS: 500.0000 mg | ORAL_TABLET | Freq: Three times a day (TID) | ORAL | Status: AC | PRN

## 2023-02-23 MED ORDER — DOCUSATE SODIUM 100 MG PO CAPS: 100.0000 mg | ORAL_CAPSULE | Freq: Two times a day (BID) | ORAL | Status: DC

## 2023-02-23 MED ORDER — INSULIN ASPART 100 UNIT/ML IJ SOLN: 3.0000 [IU] | Freq: Three times a day (TID) | INTRAMUSCULAR | Status: DC

## 2023-02-23 MED ORDER — LACTULOSE 10 GM/15ML PO SOLN: 20.0000 g | Freq: Two times a day (BID) | ORAL | Status: DC

## 2023-02-23 MED ORDER — ACETAMINOPHEN 500 MG PO TABS: 1000.0000 mg | ORAL_TABLET | Freq: Four times a day (QID) | ORAL | Status: AC | PRN

## 2023-02-23 MED ORDER — ASPIRIN EC 81 MG PO TBEC: DELAYED_RELEASE_TABLET | ORAL | Status: AC

## 2023-02-23 MED ORDER — ONDANSETRON HCL 4 MG PO TABS: 4.0000 mg | ORAL_TABLET | Freq: Four times a day (QID) | ORAL | Status: DC | PRN

## 2023-02-23 MED ORDER — CHLORHEXIDINE GLUCONATE CLOTH 2 % EX PADS
6.0000 | MEDICATED_PAD | Freq: Every day | CUTANEOUS | Status: DC
  Administered 2023-02-24 – 2023-02-25 (×2): 6 via TOPICAL

## 2023-02-23 MED ORDER — HYDROMORPHONE HCL 2 MG PO TABS: 1.0000 mg | ORAL_TABLET | Freq: Four times a day (QID) | ORAL | 0 refills | Status: AC | PRN

## 2023-02-23 MED ORDER — DARBEPOETIN ALFA 100 MCG/0.5ML IJ SOSY
100.0000 ug | PREFILLED_SYRINGE | INTRAMUSCULAR | Status: DC
  Administered 2023-02-24: 100 ug via SUBCUTANEOUS
  Filled 2023-02-23: qty 0.5

## 2023-02-23 MED ORDER — SIMETHICONE 80 MG PO CHEW
160.0000 mg | CHEWABLE_TABLET | Freq: Four times a day (QID) | ORAL | Status: DC | PRN
  Administered 2023-02-23: 160 mg via ORAL
  Filled 2023-02-23: qty 2

## 2023-02-23 MED ORDER — RENA-VITE PO TABS: 1.0000 | ORAL_TABLET | Freq: Every day | ORAL | Status: DC

## 2023-02-23 MED ORDER — HYDROMORPHONE HCL 2 MG PO TABS
1.0000 mg | ORAL_TABLET | Freq: Four times a day (QID) | ORAL | Status: DC | PRN
  Administered 2023-02-26: 1 mg via ORAL
  Filled 2023-02-23 (×2): qty 1

## 2023-02-23 MED ORDER — BISACODYL 5 MG PO TBEC: 5.0000 mg | DELAYED_RELEASE_TABLET | Freq: Every day | ORAL | Status: DC | PRN

## 2023-02-23 MED ORDER — METOCLOPRAMIDE HCL 5 MG/ML IJ SOLN
5.0000 mg | Freq: Four times a day (QID) | INTRAMUSCULAR | Status: DC | PRN
  Administered 2023-02-23: 5 mg via INTRAVENOUS
  Filled 2023-02-23: qty 2

## 2023-02-23 NOTE — Progress Notes (Signed)
[facility] Progress Note   Subjective:    Seen and examined patient at bedside. Tolerated yesterday's HD with net UF 3.5L. Plan for him to go to a SNF in [place] for rehab. Awaiting approval to transfer to [facility]. Next HF 2/22.  Objective Vitals:   02/22/23 1510 02/22/23 1900 02/23/23 0600 02/23/23 0735  BP: (!) 123/58 (!) 151/55 (!) 158/57 (!) 151/59  Pulse: 86 71 77 71  Resp:   18 15  Temp: 98.6 F (37 C) 98.3 F (36.8 C) 98.6 F (37 C) 98.3 F (36.8 C)  TempSrc: Oral Oral Oral Oral  SpO2: 98% 100% 96% 100%  Weight:      Height:       Physical Exam General: Pleasant older male in NAD Heart:S1,S2 3/6 systolic M. No R/G Lungs: CTAB A/P Abdomen: NABS Extremities:Drsg R hip intact. Trace edema RU Thigh. No LLE edema.  Dialysis Access: L AVF + T/B  Filed Weights   02/19/23 1340 02/19/23 1733 02/22/23 0817  Weight: 84.1 kg 82.6 kg 84 kg    Intake/Output Summary (Last 24 hours) at 02/23/2023 1245 Last data filed at 02/22/2023 2000 Gross per 24 hour  Intake --  Output 3600 ml  Net -3600 ml    Additional Objective Labs: Basic Metabolic Panel: Recent Labs  Lab 02/16/23 1813 02/17/23 0559 02/19/23 0533  NA 130* 133* 132*  K 3.8 3.3* 4.4  CL 89* 94* 89*  CO2 24 26 24   GLUCOSE 535* 214* 371*  BUN 30* 34* 79*  CREATININE 6.87* 7.83* 11.25*  CALCIUM 8.9 8.5* 8.2*  PHOS  --  4.3 7.8*   Liver Function Tests: Recent Labs  Lab 02/17/23 0559 02/19/23 0533  ALBUMIN 3.1* 2.8*   No results for input(s): "LIPASE", "AMYLASE" in the last 168 hours. CBC: Recent Labs  Lab 02/16/23 1813 02/17/23 0552 02/18/23 0903 02/19/23 0533 02/20/23 0847  WBC 8.3 9.5 10.9* 9.4 9.8  NEUTROABS 6.1  --  8.9* 6.8 7.4  HGB 10.3* 8.6* 7.1* 6.4* 8.2*  HCT 28.5* 23.9* 19.5* 18.0* 22.8*  MCV 87.7 86.3 85.9 89.1 87.4  PLT 190 171 152 148* 150   Blood Culture    Component Value Date/Time   SDES BLOOD RIGHT ARM 06/05/2022 1759   SPECREQUEST  06/05/2022  1759    BOTTLES DRAWN AEROBIC AND ANAEROBIC Blood Culture results may not be optimal due to an excessive volume of blood received in culture bottles   CULT  06/05/2022 1759    NO GROWTH 5 DAYS Performed at [facility], [address] [place], [place] [address]    REPTSTATUS 06/10/2022 FINAL 06/05/2022 1759    Cardiac Enzymes: No results for input(s): "CKTOTAL", "CKMB", "CKMBINDEX", "TROPONINI" in the last 168 hours. CBG: Recent Labs  Lab 02/22/23 1428 02/22/23 1730 02/22/23 2132 02/23/23 0748 02/23/23 1119  GLUCAP 149* 244* 215* 167* 275*   Iron Studies: No results for input(s): "IRON", "TIBC", "TRANSFERRIN", "FERRITIN" in the last 72 hours. Lab Results  Component Value Date   INR 1.1 02/16/2023   INR 1.3 (H) 04/29/2018   Studies/Results: DG CHEST PORT 1 VIEW Result Date: 02/22/2023 CLINICAL DATA:  Shortness of breath.  Hypertension. EXAM: PORTABLE CHEST 1 VIEW COMPARISON:  Chest radiograph dated 02/16/2023. FINDINGS: No focal consolidation, pleural effusion, or pneumothorax. Mild cardiomegaly. Atherosclerotic calcification of the aorta. No acute osseous pathology. IMPRESSION: 1. No active disease. 2. Mild cardiomegaly. Electronically Signed   By: Elgie Collard M.D.   On: 02/22/2023 14:52  Medications:   amLODipine  10 mg Oral Daily   bisacodyl  10 mg Rectal Once   Chlorhexidine Gluconate Cloth  6 each Topical Q0600   [START ON 02/24/2023] darbepoetin (ARANESP) injection - DIALYSIS  100 mcg Subcutaneous Q Sat-1800   docusate sodium  100 mg Oral BID   feeding supplement (NEPRO CARB STEADY)  237 mL Oral TID BM   ferric citrate  630 mg Oral TID WC   heparin injection (subcutaneous)  5,000 Units Subcutaneous Q8H   insulin aspart  0-5 Units Subcutaneous QHS   insulin aspart  0-6 Units Subcutaneous TID WC   insulin aspart  3 Units Subcutaneous TID WC   insulin glargine-yfgn  12 Units Subcutaneous QHS   isosorbide mononitrate  30 mg Oral Once per day on Sunday  Tuesday Thursday Saturday   lactulose  20 g Oral BID   methocarbamol  500 mg Oral Q12H   metoprolol succinate  50 mg Oral Daily   multivitamin  1 tablet Oral QHS   rosuvastatin  40 mg Oral Daily    Dialysis Orders: TTS  4h  B450  81.1kg   2K bath  L AVF  Heparin none  - last HD 2/13, post wt 81.6kg, gets to dry wt, wt gain ~ 3kg - hectorol 2 mcg IV  - no esa - last Hb 10.5, ferritin 1605, pth 222    Renal-related home meds: - ferric citrate 3 ac tid - hydralazine 50 tid  - norvasc 10 - metoprolol xl 50 every day - others: imdur, sl ntg prn, statin, novolin insulin, asa  Assessment/Plan: R hip fracture - after a fall. S/P medullary nail procedure done today 2/15 by orthopedic surgery. PT now working with patient. ESRD - on HD TTS. Pt is stable, no vol or lab issues. Patient prefers to have his treatment to be done in the afternoon so he's not worn out when working with PT. Will attempt to schedule but also advised him that this is not always guaranteed. Next HD 02/24/2020 HTN - BP's currently acceptable. Continue current medications Volume - Euvolemic on exam. UF as tolerated. Anemia of esrd -  Last repeat HGB 8.2. Check iron panel next HD. Doesn't appear ESA was NOT given yesterday. Secondary hyperparathyroidism - Ca and phos in range but alb is low, on Nepro. Cont binders w/ meals and IV vdra.   Salome Holmes, NP [facility] 02/23/2023,12:45 PM  LOS: 7 days

## 2023-02-23 NOTE — Progress Notes (Signed)
Occupational Therapy Treatment Patient Details Name: TYRIAN PEART MRN: [id number] DOB: 1950/07/29 Today's Date: 02/23/2023   History of present illness 73 year old admitted 2/14 after fall at home with right hip fracture. IM nail right femur 2/14. PMH: ESRD on TTS dialysis, CAD, type 2 diabetes on insulin, hypertension hyperlipidemia.   OT comments  Patient demonstrating good gain this week with OT. Patient demonstrating gains with bed mobility and sit to stands. Patient able to stand from EOB and BSC with mod assist +2 and min assist +2 from pads on Stedy. Discharge recommendations continue to be appropriate. Acute OT to continue to follow to address established goals to facilitate DC to next venue of care.       If plan is discharge home, recommend the following:  Assist for transportation;A lot of help with bathing/dressing/bathroom;A little help with walking and/or transfers;Assistance with cooking/housework   Equipment Recommendations  BSC/3in1;Tub/shower seat    Recommendations for Other Services      Precautions / Restrictions Precautions Precautions: Fall Recall of Precautions/Restrictions: Impaired Restrictions Weight Bearing Restrictions Per Provider Order: Yes RLE Weight Bearing Per Provider Order: Weight bearing as tolerated Other Position/Activity Restrictions: limited by pain       Mobility Bed Mobility Overal bed mobility: Needs Assistance Bed Mobility: Sit to Supine     Supine to sit: Mod assist, HOB elevated, Used rails     General bed mobility comments: assistance with BLEs and patient able to raise trunk with use of bed rails. assistance to scoot to EOB with bed pad    Transfers Overall transfer level: Needs assistance Equipment used: Ambulation equipment used Transfers: Sit to/from Stand, Bed to chair/wheelchair/BSC Sit to Stand: Mod assist, +2 physical assistance           General transfer comment: mod assist +2 to stand from EOB and BSC and  min assist +2 from [employer] pads Transfer via Lift Equipment: Stedy   Balance Overall balance assessment: Needs assistance Sitting-balance support: Feet supported, Bilateral upper extremity supported Sitting balance-Leahy Scale: Poor Sitting balance - Comments: supervision to CGA for sitting on EOB Postural control: Posterior lean, Left lateral lean Standing balance support: Bilateral upper extremity supported, Reliant on assistive device for balance Standing balance-Leahy Scale: Poor Standing balance comment: reliant on Stedy for support                           ADL either performed or assessed with clinical judgement   ADL Overall ADL's : Needs assistance/impaired     Grooming: Set up;Sitting   Upper Body Bathing: Set up;Sitting   Lower Body Bathing: Maximal assistance;Sit to/from stand   Upper Body Dressing : Set up;Sitting       Toilet Transfer: Moderate assistance;+2 for physical assistance;BSC/3in1 Toilet Transfer Details (indicate cue type and reason): Transfer to [facility] with use of stedy and +2 assist, patient able to stand from stedy pads with min assist Toileting- Clothing Manipulation and Hygiene: Maximal assistance;Sit to/from stand Toileting - Clothing Manipulation Details (indicate cue type and reason): while standing in [place]            Extremity/Trunk Assessment Upper Extremity Assessment Upper Extremity Assessment: Overall WFL for tasks assessed            Vision       Perception     Praxis     Communication Communication Communication: No apparent difficulties   Cognition Arousal: Alert Behavior During Therapy: Anxious Cognition: No apparent impairments  Following commands: Intact        Cueing   Cueing Techniques: Verbal cues, Tactile cues, Gestural cues, Visual cues  Exercises      Shoulder Instructions       General Comments possible blister on right hip, nursing notified     Pertinent Vitals/ Pain       Pain Assessment Pain Assessment: Faces Faces Pain Scale: Hurts even more Pain Location: R hip with movement and weight bearing. Pain Descriptors / Indicators: Grimacing, Guarding, Sharp, Moaning, Pounding Pain Intervention(s): Limited activity within patient's tolerance, Monitored during session, Repositioned  Home Living                                          Prior Functioning/Environment              Frequency  Min 1X/week        Progress Toward Goals  OT Goals(current goals can now be found in the care plan section)  Progress towards OT goals: Progressing toward goals  Acute Rehab OT Goals Patient Stated Goal: less pain OT Goal Formulation: With patient Time For Goal Achievement: 03/05/23 Potential to Achieve Goals: Good ADL Goals Pt Will Perform Grooming: with supervision;standing Pt Will Perform Lower Body Dressing: with min assist;sit to/from stand Pt Will Transfer to Toilet: with supervision;regular height toilet;ambulating  Plan      Co-evaluation                 AM-PAC OT "6 Clicks" Daily Activity     Outcome Measure   Help from another person eating meals?: None Help from another person taking care of personal grooming?: A Little Help from another person toileting, which includes using toliet, bedpan, or urinal?: A Lot Help from another person bathing (including washing, rinsing, drying)?: A Lot Help from another person to put on and taking off regular upper body clothing?: None Help from another person to put on and taking off regular lower body clothing?: A Lot 6 Click Score: 17    End of Session Equipment Utilized During Treatment: Gait belt;Other (comment) Antony Salmon)  OT Visit Diagnosis: Unsteadiness on feet (R26.81);Muscle weakness (generalized) (M62.81);Pain Pain - Right/Left: Right Pain - part of body: Leg   Activity Tolerance Patient tolerated treatment well;Patient limited by  pain   Patient Left in chair;with call bell/phone within reach;with family/visitor present   Nurse Communication Mobility status;Need for lift equipment        Time: 1610-9604 OT Time Calculation (min): 30 min  Charges: OT General Charges $OT Visit: 1 Visit OT Treatments $Self Care/Home Management : 23-37 mins  Alfonse Flavors, OTA Acute Rehabilitation Services  Office [phone]   Dewain Penning 02/23/2023, 1:39 PM

## 2023-02-23 NOTE — Assessment & Plan Note (Addendum)
 02-23-2023 on lantus 12 u at bedtime, mealtime novolog 3 units plus SSI. 02-24-2023 stable.  02-25-2023 stable. CBG acceptable ranges

## 2023-02-23 NOTE — Progress Notes (Addendum)
PROGRESS NOTE    Justin [name]  ZOX:[id number] DOB: 28-Sep-1950 DOA: 02/16/2023 PCP: Adrian Prince, MD  Subjective: Pt seen and examined. Awaiting transfer to SNF. Has insurance authorization. Family/pt has chosen [facility] in [place], [place] for rehab.  Awaiting renal SW to confirm pt has outpatient HD slot  Pt without any complaints.   Hospital Course: HPI: Justin [name] is a 73 y.o. male with medical history significant for ESRD on TTS HD, CAD, moderate aortic stenosis, T2DM, HTN, HLD who presented to the ED for evaluation of right hip pain after a fall.   Patient states that he was changing a bucket on a bobcat inside of a concrete building when he lost his balance and fell backwards landing on the ground.  He had immediate pain to his right hip.  He denies hitting his head or losing consciousness.  EMS were called.  He was noted to have gross deformity of the right hip.  He was brought to the ED for further evaluation.   Patient takes aspirin 81 mg daily, no other blood thinners.  He denies any recent chest pain, dyspnea.  He reports attending usual HD, last session was yesterday 2/13.  His primary nephrologist is Dr. Glenna Fellows.   ED Course  Labs/Imaging on admission: I have personally reviewed following labs and imaging studies.   Initial vitals showed BP 110/50, pulse 73, RR 15, temp 98.2 F, SpO2 100% on room air.   Labs show serum glucose 535, sodium 130 (140 when corrected for hyperglycemia), potassium 3.8, bicarb 24, BUN 30, creatinine 6.7, WBC 8.3, hemoglobin 10.3, platelets 190,000.   Pelvic/right femur x-ray showed a comminuted displaced intertrochanteric right proximal femur fracture.   CT head without contrast negative for acute intracranial normality.   CT cervical spine without contrast negative for acute displaced fracture or traumatic listhesis of the C-spine.   Patient was given 10 units subcutaneous NovoLog, IV Dilaudid and fentanyl.  Orthopedics (Dr. Hulda Humphrey)  were consulted and recommended medical admission.  The hospitalist service was consulted to admit.   Significant Events: Admitted 02/16/2023 for right intertrochanteric femur fracture 02-17-2023 Right IM nail for intertrochanteric fracture 02/19/2023, 1 unit PRBC transfusion for hemoglobin 6.4. 02/20/2023, overnight with some delirium after hemodialysis.  Clinically improving.  Needs a skilled nursing facility with dialysis transport.  Significant Labs: WBC 8.3, Hg 10.3, Plt 190 Na 133, K 3.3, Cl 94, BUN 34, Scr 7.83  Significant Imaging Studies: Right femur XR shows Comminuted displaced intertrochanteric right proximal femur fracture. CT head/c-spine shows No acute intracranial abnormality. 2. No acute displaced fracture or traumatic listhesis of the cervical spine. CXR shows Chronic cardiomegaly. No acute chest findings.   Antibiotic Therapy: Anti-infectives (From admission, onward)    Start     Dose/Rate Route Frequency Ordered Stop   02/17/23 0731  ceFAZolin (ANCEF) 2-4 GM/100ML-% IVPB       Note to Pharmacy: Eliott Nine K: cabinet override      02/17/23 0731 02/17/23 1944      Procedures: 02-17-2023 IM nail right femur  Consultants: Nephrology Orthopedics    Assessment and Plan: * Closed comminuted intertrochanteric fracture of proximal end of femur with nonunion, right 02-16-2023 through 02-22-2023 Status post ORIF, IM nail right femur. Dr Hulda Humphrey 2/15.  Weightbearing as tolerated.  Adequate pain control with oral opiates.  Prefers tramadol. Continue to work with PT OT. DVT prophylaxis, Subcu heparin while in the hospital.  Aspirin 81 mg twice daily after starting mobilizing.  02-23-2023 s/p ORIF on  02-17-2023. Preferred opiate therapy in ESRD patients is either fentanyl or dilaudid. No use for IV dilaudid at this time. Pt is POD#6. Change to po dilaudid 1 mg q4h prn pain. Medically stable for DC to SNF  S/P ORIF (open reduction internal fixation) fracture - right  intertrochanteric fracture 02-23-2023 DVT prophylaxis SQ heparin while in the hospital. @ discharge, pt will be changed to ASA 81 mg bid per ortho. Pt is weight bearing as tolerated.  Globus sensation 02-23-2023 pt's POA [name] mentioned pt having globus sensation. Will check esophagram to make sure there isn't a foreign body in his esophagus or a retained pill fragment.  Acute postoperative anemia due to expected blood loss 02-19-2023 received 1 unit PRBC for HGB of 6.4 g/dl. Had surgery on 02-17-2023  02-23-2023 post-transfusion Hgb 7.4 on 02-20-2023.  ESRD on hemodialysis (HCC) - out-pt HD at [facility] on TTS 5:15 am chair time 02-23-2023 pt seen by nephrology and has had routine HD via left UE AVF/AVG.  CM looking for outpatient HD bed near [facility] in [place].  Pt has insurance authorization and SNF bed offer.  Type 2 diabetes mellitus with hyperglycemia (HCC) 02-23-2023 on lantus 12 u at bedtime, mealtime novolog 3 units plus SSI.  Anemia in chronic kidney disease 02-23-2023 stable. On Aranesp qweekly.  Essential hypertension 02-23-2023. Continue with Toprol-xl 50 mg qday, imdur 30 mg qSun, Tues, Thurs, Sat, norvasc 10 mg daily.  Coronary artery disease involving native coronary artery of native heart without angina pectoris 02-23-2023 stable. On crestor 40 mg every day, Toprol-XL 50 mg qday, imdur 30 mg q Sun, Tues, Thurs, Sat.  Nonrheumatic aortic valve stenosis 02-23-2023 moderate per cardiology noted 12-06-2022.  Managed by Dr. Bing Matter.  Hyperlipidemia 02-23-2023 stable. On crestor 40 mg daily.       DVT prophylaxis: heparin injection 5,000 Units Start: 02/20/23 1415 SCDs Start: 02/16/23 2147    Code Status: Full Code Family Communication: no family at bedside this AM. Pt is decisional Disposition Plan: SNF Reason for continuing need for hospitalization:  medically stable for DC. Awaiting for outpatient HD appointment.  Objective: Vitals:   02/22/23  1510 02/22/23 1900 02/23/23 0600 02/23/23 0735  BP: (!) 123/58 (!) 151/55 (!) 158/57 (!) 151/59  Pulse: 86 71 77 71  Resp:   18 15  Temp: 98.6 F (37 C) 98.3 F (36.8 C) 98.6 F (37 C) 98.3 F (36.8 C)  TempSrc: Oral Oral Oral Oral  SpO2: 98% 100% 96% 100%  Weight:      Height:        Intake/Output Summary (Last 24 hours) at 02/23/2023 1339 Last data filed at 02/22/2023 2000 Gross per 24 hour  Intake --  Output 100 ml  Net -100 ml   Filed Weights   02/19/23 1340 02/19/23 1733 02/22/23 0817  Weight: 84.1 kg 82.6 kg 84 kg    Examination:  Physical Exam Vitals and nursing note reviewed.  Constitutional:      General: He is not in acute distress.    Appearance: He is not toxic-appearing.  HENT:     Head: Normocephalic and atraumatic.     Nose: Nose normal.  Eyes:     General: No scleral icterus. Cardiovascular:     Rate and Rhythm: Normal rate and regular rhythm.     Heart sounds: Murmur heard.     Systolic murmur is present with a grade of 3/6.     Comments: Consistent with his known aortic stenosis. Pulmonary:  Effort: Pulmonary effort is normal.     Breath sounds: Normal breath sounds.  Abdominal:     General: Abdomen is flat. Bowel sounds are normal. There is no distension.     Palpations: Abdomen is soft.  Musculoskeletal:     Comments: Left UE AVG/AVF  Skin:    General: Skin is warm and dry.     Capillary Refill: Capillary refill takes less than 2 seconds.  Neurological:     Mental Status: He is alert and oriented to person, place, and time.     Data Reviewed: I have personally reviewed following labs and imaging studies  CBC: Recent Labs  Lab 02/16/23 1813 02/17/23 0552 02/18/23 0903 02/19/23 0533 02/20/23 0847  WBC 8.3 9.5 10.9* 9.4 9.8  NEUTROABS 6.1  --  8.9* 6.8 7.4  HGB 10.3* 8.6* 7.1* 6.4* 8.2*  HCT 28.5* 23.9* 19.5* 18.0* 22.8*  MCV 87.7 86.3 85.9 89.1 87.4  PLT 190 171 152 148* 150   Basic Metabolic Panel: Recent Labs  Lab  02/16/23 1813 02/17/23 0559 02/19/23 0533  NA 130* 133* 132*  K 3.8 3.3* 4.4  CL 89* 94* 89*  CO2 24 26 24   GLUCOSE 535* 214* 371*  BUN 30* 34* 79*  CREATININE 6.87* 7.83* 11.25*  CALCIUM 8.9 8.5* 8.2*  PHOS  --  4.3 7.8*   GFR: Estimated Creatinine Clearance: 5.9 mL/min (A) (by C-G formula based on SCr of 11.25 mg/dL (H)). Liver Function Tests: Recent Labs  Lab 02/17/23 0559 02/19/23 0533  ALBUMIN 3.1* 2.8*   Coagulation Profile: Recent Labs  Lab 02/16/23 1813  INR 1.1   BNP (last 3 results) Recent Labs    06/05/22 1010  BNP 1,835.7*   CBG: Recent Labs  Lab 02/22/23 1428 02/22/23 1730 02/22/23 2132 02/23/23 0748 02/23/23 1119  GLUCAP 149* 244* 215* 167* 275*   Recent Results (from the past 240 hours)  Surgical PCR screen     Status: None   Collection Time: 02/17/23  2:45 AM   Specimen: Nasal Mucosa; Nasal Swab  Result Value Ref Range Status   MRSA, PCR NEGATIVE NEGATIVE Final   Staphylococcus aureus NEGATIVE NEGATIVE Final    Comment: (NOTE) The Xpert SA Assay (FDA approved for NASAL specimens in patients 61 years of age and older), is one component of a comprehensive surveillance program. It is not intended to diagnose infection nor to guide or monitor treatment. Performed at [facility], [address], [place], [place] [address]     Radiology Studies: DG CHEST PORT 1 VIEW Result Date: 02/22/2023 CLINICAL DATA:  Shortness of breath.  Hypertension. EXAM: PORTABLE CHEST 1 VIEW COMPARISON:  Chest radiograph dated 02/16/2023. FINDINGS: No focal consolidation, pleural effusion, or pneumothorax. Mild cardiomegaly. Atherosclerotic calcification of the aorta. No acute osseous pathology. IMPRESSION: 1. No active disease. 2. Mild cardiomegaly. Electronically Signed   By: Elgie Collard M.D.   On: 02/22/2023 14:52    Scheduled Meds:  amLODipine  10 mg Oral Daily   bisacodyl  10 mg Rectal Once   [START ON 02/24/2023] Chlorhexidine Gluconate Cloth  6  each Topical Q0600   [START ON 02/24/2023] darbepoetin (ARANESP) injection - DIALYSIS  100 mcg Subcutaneous Q Sat-1800   docusate sodium  100 mg Oral BID   feeding supplement (NEPRO CARB STEADY)  237 mL Oral TID BM   ferric citrate  630 mg Oral TID WC   heparin injection (subcutaneous)  5,000 Units Subcutaneous Q8H   insulin aspart  0-5 Units  Subcutaneous QHS   insulin aspart  0-6 Units Subcutaneous TID WC   insulin aspart  3 Units Subcutaneous TID WC   insulin glargine-yfgn  12 Units Subcutaneous QHS   isosorbide mononitrate  30 mg Oral Once per day on Sunday Tuesday Thursday Saturday   lactulose  20 g Oral BID   methocarbamol  500 mg Oral Q12H   metoprolol succinate  50 mg Oral Daily   multivitamin  1 tablet Oral QHS   rosuvastatin  40 mg Oral Daily   Continuous Infusions:   LOS: 7 days   Time spent: 45 minutes  Carollee Herter, DO  [facility]  02/23/2023, 1:39 PM

## 2023-02-23 NOTE — Assessment & Plan Note (Addendum)
 02-19-2023 received 1 unit PRBC for HGB of 6.4 g/dl. Had surgery on 02-17-2023 02-23-2023 post-transfusion Hgb 7.4 on 02-20-2023.  02-25-2023 plan on giving him 2 unit of PRBC during early AM on 02-26-2023. Hopefully he can get HD during AM on 02-26-2023 and then DC to SNF.

## 2023-02-23 NOTE — Assessment & Plan Note (Signed)
02-23-2023 stable. On Aranesp qweekly.

## 2023-02-23 NOTE — Discharge Summary (Signed)
[facility] Physician Discharge Summary   Patient name: Justin [name]  Admit date:     02/16/2023  Discharge date: 02/23/2023  Attending Physician: Charlsie Quest [[id number]]  Discharge Physician: Carollee Herter   PCP: Adrian Prince, MD  Admitted From: Home  Disposition:   [facility]  Recommendations for Outpatient Follow-up:  Follow up with PCP in 1-2 weeks Follow up with Dr. Thornell Mule with [facility]. Call for appointment to be seen in 4-6 weeks. Office number is [phone]  Home Health:No Equipment/Devices: None  Discharge Condition:Stable CODE STATUS:FULL Diet recommendation: Renal/Diabetic Fluid Restriction:  2000 ml/day  Hospital Summary: HPI: Justin [name] is a 73 y.o. male with medical history significant for ESRD on TTS HD, CAD, moderate aortic stenosis, T2DM, HTN, HLD who presented to the ED for evaluation of right hip pain after a fall.   Patient states that he was changing a bucket on a bobcat inside of a concrete building when he lost his balance and fell backwards landing on the ground.  He had immediate pain to his right hip.  He denies hitting his head or losing consciousness.  EMS were called.  He was noted to have gross deformity of the right hip.  He was brought to the ED for further evaluation.   Patient takes aspirin 81 mg daily, no other blood thinners.  He denies any recent chest pain, dyspnea.  He reports attending usual HD, last session was yesterday 2/13.  His primary nephrologist is Dr. Glenna Fellows.   ED Course  Labs/Imaging on admission: I have personally reviewed following labs and imaging studies.   Initial vitals showed BP 110/50, pulse 73, RR 15, temp 98.2 F, SpO2 100% on room air.   Labs show serum glucose 535, sodium 130 (140 when corrected for hyperglycemia), potassium 3.8, bicarb 24, BUN 30, creatinine 6.7, WBC 8.3, hemoglobin 10.3, platelets 190,000.   Pelvic/right femur x-ray showed a comminuted displaced  intertrochanteric right proximal femur fracture.   CT head without contrast negative for acute intracranial normality.   CT cervical spine without contrast negative for acute displaced fracture or traumatic listhesis of the C-spine.   Patient was given 10 units subcutaneous NovoLog, IV Dilaudid and fentanyl.  Orthopedics (Dr. Hulda Humphrey) were consulted and recommended medical admission.  The hospitalist service was consulted to admit.   Significant Events: Admitted 02/16/2023 for right intertrochanteric femur fracture 02-17-2023 Right IM nail for intertrochanteric fracture 02/19/2023, 1 unit PRBC transfusion for hemoglobin 6.4. 02/20/2023, overnight with some delirium after hemodialysis.  Clinically improving.  Needs a skilled nursing facility with dialysis transport.  Significant Labs: WBC 8.3, Hg 10.3, Plt 190 Na 133, K 3.3, Cl 94, BUN 34, Scr 7.83  Significant Imaging Studies: Right femur XR shows Comminuted displaced intertrochanteric right proximal femur fracture. CT head/c-spine shows No acute intracranial abnormality. 2. No acute displaced fracture or traumatic listhesis of the cervical spine. CXR shows Chronic cardiomegaly. No acute chest findings.   Antibiotic Therapy: Anti-infectives (From admission, onward)    Start     Dose/Rate Route Frequency Ordered Stop   02/17/23 0731  ceFAZolin (ANCEF) 2-4 GM/100ML-% IVPB       Note to Pharmacy: Eliott Nine K: cabinet override      02/17/23 0731 02/17/23 1944      Procedures: 02-17-2023 IM nail right femur  Consultants: Nephrology Orthopedics   Hospital Course by Problem: * Closed comminuted intertrochanteric fracture of proximal end of femur with nonunion, right 02-16-2023 through 02-22-2023 Status post ORIF, IM  nail right femur. Dr Hulda Humphrey 2/15.  Weightbearing as tolerated.  Adequate pain control with oral opiates.  Prefers tramadol. Continue to work with PT OT. DVT prophylaxis, Subcu heparin while in the hospital.  Aspirin 81 mg  twice daily after starting mobilizing.  02-23-2023 s/p ORIF on 02-17-2023. Preferred opiate therapy in ESRD patients is either fentanyl or dilaudid. No use for IV dilaudid at this time. Pt is POD#6. Change to po dilaudid 1 mg q4h prn pain. Medically stable for DC to SNF  S/P ORIF (open reduction internal fixation) fracture - right intertrochanteric fracture 02-23-2023 DVT prophylaxis SQ heparin while in the hospital. @ discharge, pt will be changed to ASA 81 mg bid per ortho. Pt is weight bearing as tolerated.  Delirium due to multiple etiologies Pt had hospital-acquired delirium. Multiple etiologies.  Pt mentation finally cleared and he was AxOx3 by the time he was discharged.   Globus sensation 02-23-2023 pt's POA [name] mentioned pt having globus sensation. Will check esophagram to make sure there isn't a foreign body in his esophagus or a retained pill fragment.  Acute postoperative anemia due to expected blood loss 02-19-2023 received 1 unit PRBC for HGB of 6.4 g/dl. Had surgery on 02-17-2023  02-23-2023 post-transfusion Hgb 7.4 on 02-20-2023.  ESRD on hemodialysis (HCC) - out-pt HD at [facility] on TTS 5:15 am chair time 02-23-2023 pt seen by nephrology and has had routine HD via left UE AVF/AVG.  CM looking for outpatient HD bed near [facility] in [place].  Pt has insurance authorization and SNF bed offer.  Type 2 diabetes mellitus with hyperglycemia (HCC) 02-23-2023 on lantus 12 u at bedtime, mealtime novolog 3 units plus SSI.  Anemia in chronic kidney disease 02-23-2023 stable. On Aranesp qweekly.  Essential hypertension 02-23-2023. Continue with Toprol-xl 50 mg qday, imdur 30 mg qSun, Tues, Thurs, Sat, norvasc 10 mg daily.  Coronary artery disease involving native coronary artery of native heart without angina pectoris 02-23-2023 stable. On crestor 40 mg every day, Toprol-XL 50 mg qday, imdur 30 mg q Sun, Tues, Thurs, Sat.  Nonrheumatic aortic valve stenosis 02-23-2023  moderate per cardiology noted 12-06-2022.  Managed by Dr. Bing Matter.  Hyperlipidemia 02-23-2023 stable. On crestor 40 mg daily.    Discharge Diagnoses:  Principal Problem:   Closed comminuted intertrochanteric fracture of proximal end of femur with nonunion, right Active Problems:   S/P ORIF (open reduction internal fixation) fracture - right intertrochanteric fracture   ESRD on hemodialysis (HCC) - out-pt HD at [facility] on TTS 5:15 am chair time   Acute postoperative anemia due to expected blood loss   Globus sensation   Delirium due to multiple etiologies   Hyperlipidemia   Nonrheumatic aortic valve stenosis   Coronary artery disease involving native coronary artery of native heart without angina pectoris   Essential hypertension   Anemia in chronic kidney disease   Type 2 diabetes mellitus with hyperglycemia [facility])   Discharge Instructions  Discharge Instructions     Call MD for:  difficulty breathing, headache or visual disturbances   Complete by: As directed    Call MD for:  extreme fatigue   Complete by: As directed    Call MD for:  hives   Complete by: As directed    Call MD for:  persistant dizziness or light-headedness   Complete by: As directed    Call MD for:  persistant nausea and vomiting   Complete by: As directed    Call MD for:  redness, tenderness,  or signs of infection (pain, swelling, redness, odor or green/yellow discharge around incision site)   Complete by: As directed    Call MD for:  severe uncontrolled pain   Complete by: As directed    Call MD for:  temperature >100.4   Complete by: As directed    Discharge instructions   Complete by: As directed    1. Follow up with Orthopedics Dr.Bradley Wham in 4-6 weeks. SNF to all for appointment. Office # is [phone] 2. Outpatient Hemodialysis will be on M, W, F while at SNF/rehab.   [facility] on MWF 11:30 am chair time. 3. Follow up with Primary Care Provider in 1-2 weeks after discharge  from SNF   Increase activity slowly   Complete by: As directed    Weight bearing as tolerated   Complete by: As directed       Allergies as of 02/23/2023   No Known Allergies      Medication List     STOP taking these medications    NovoLIN 70/30 ReliOn (70-30) 100 UNIT/ML injection Generic drug: insulin NPH-regular Human       TAKE these medications    acetaminophen 500 MG tablet Commonly known as: TYLENOL Take 2 tablets (1,000 mg total) by mouth every 6 (six) hours as needed for mild pain (pain score 1-3), fever or headache.   amLODipine 10 MG tablet Commonly known as: NORVASC Take 1 tablet by mouth once daily   aspirin EC 81 MG tablet Take 1 tablet (81 mg total) by mouth in the morning and at bedtime for 42 days, THEN 1 tablet (81 mg total) daily. Swallow whole.. Start taking on: February 23, 2023 What changed: See the new instructions.   bisacodyl 5 MG EC tablet Commonly known as: DULCOLAX Take 1 tablet (5 mg total) by mouth daily as needed for moderate constipation.   cyanocobalamin 1000 MCG tablet Commonly known as: VITAMIN B12 Take 1,000 mcg by mouth daily.   docusate sodium 100 MG capsule Commonly known as: COLACE Take 1 capsule (100 mg total) by mouth 2 (two) times daily.   ferric citrate 1 GM 210 MG(Fe) tablet Commonly known as: AURYXIA Take 630 mg by mouth 3 (three) times daily with meals.   hydrALAZINE 25 MG tablet Commonly known as: APRESOLINE Take 2 tablets (50 mg total) by mouth See admin instructions. Take 50 mg 3 times daily on M, W, F(dialysis days) Take 50 mg twice daily on Tu, Th, Sat, Sun(non-dialysis days) What changed: additional instructions   HYDROmorphone 2 MG tablet Commonly known as: DILAUDID Take 0.5 tablets (1 mg total) by mouth every 6 (six) hours as needed for severe pain (pain score 7-10) or moderate pain (pain score 4-6).   insulin aspart 100 UNIT/ML injection Commonly known as: novoLOG Inject 0-6 Units into the skin 3  (three) times daily with meals. CBG 70 - 120: 0 units CBG 121 - 150: 0 units CBG 151 - 200: 1 unit CBG 201-250: 2 units CBG 251-300: 3 units CBG 301-350: 4 units CBG 351-400: 5 units CBG > 400: Give 6 units and call MD   insulin aspart 100 UNIT/ML injection Commonly known as: novoLOG Inject 3 Units into the skin 3 (three) times daily with meals.   insulin glargine-yfgn 100 UNIT/ML injection Commonly known as: SEMGLEE Inject 0.12 mLs (12 Units total) into the skin at bedtime.   isosorbide mononitrate 30 MG 24 hr tablet Commonly known as: IMDUR Take 1 tablet (30 mg total) by  mouth 4 (four) times a week. Mon, Wed, Fri, Sun (non-dialysis days)   lactulose 10 GM/15ML solution Commonly known as: CHRONULAC Take 30 mLs (20 g total) by mouth 2 (two) times daily.   methocarbamol 500 MG tablet Commonly known as: ROBAXIN Take 1 tablet (500 mg total) by mouth every 8 (eight) hours as needed for muscle spasms.   metoCLOPramide 10 MG tablet Commonly known as: REGLAN Take 1 tablet (10 mg total) by mouth every 6 (six) hours as needed for nausea or vomiting (hiccups).   metoprolol succinate 50 MG 24 hr tablet Commonly known as: TOPROL-XL Take 50 mg by mouth daily.   MIRCERA IJ   multivitamin Tabs tablet Take 1 tablet by mouth at bedtime.   nitroGLYCERIN 0.4 MG SL tablet Commonly known as: NITROSTAT Place 0.4 mg under the tongue every 5 (five) minutes as needed for chest pain.   ondansetron 4 MG tablet Commonly known as: Zofran Take 1 tablet (4 mg total) by mouth every 6 (six) hours as needed for nausea or vomiting.   rosuvastatin 40 MG tablet Commonly known as: CRESTOR Take 40 mg by mouth daily.   simethicone 80 MG chewable tablet Commonly known as: MYLICON Chew 2 tablets (160 mg total) by mouth every 6 (six) hours as needed for flatulence.               Discharge Care Instructions  (From admission, onward)           Start     Ordered   02/23/23 0000  Weight  bearing as tolerated        02/23/23 1337            No Known Allergies  Discharge Exam: Vitals:   02/23/23 0600 02/23/23 0735  BP: (!) 158/57 (!) 151/59  Pulse: 77 71  Resp: 18 15  Temp: 98.6 F (37 C) 98.3 F (36.8 C)  SpO2: 96% 100%    Physical Exam Vitals and nursing note reviewed.  Constitutional:      General: He is not in acute distress.    Appearance: He is not toxic-appearing.  HENT:     Head: Normocephalic and atraumatic.     Nose: Nose normal.  Eyes:     General: No scleral icterus. Cardiovascular:     Rate and Rhythm: Normal rate and regular rhythm.     Heart sounds: Murmur heard.     Systolic murmur is present with a grade of 3/6.     Comments: Consistent with his known aortic stenosis. Pulmonary:     Effort: Pulmonary effort is normal.     Breath sounds: Normal breath sounds.  Abdominal:     General: Abdomen is flat. Bowel sounds are normal. There is no distension.     Palpations: Abdomen is soft.  Musculoskeletal:     Comments: Left UE AVG/AVF  Skin:    General: Skin is warm and dry.     Capillary Refill: Capillary refill takes less than 2 seconds.  Neurological:     Mental Status: He is alert and oriented to person, place, and time.     The results of significant diagnostics from this hospitalization (including imaging, microbiology, ancillary and laboratory) are listed below for reference.    Microbiology: Recent Results (from the past 240 hours)  Surgical PCR screen     Status: None   Collection Time: 02/17/23  2:45 AM   Specimen: Nasal Mucosa; Nasal Swab  Result Value Ref Range Status   MRSA, PCR  NEGATIVE NEGATIVE Final   Staphylococcus aureus NEGATIVE NEGATIVE Final    Comment: (NOTE) The Xpert SA Assay (FDA approved for NASAL specimens in patients 33 years of age and older), is one component of a comprehensive surveillance program. It is not intended to diagnose infection nor to guide or monitor treatment. Performed at [facility], [address], [place], [place] [address]      Labs: BNP (last 3 results) Recent Labs    06/05/22 1010  BNP 1,835.7*   Basic Metabolic Panel: Recent Labs  Lab 02/16/23 1813 02/17/23 0559 02/19/23 0533  NA 130* 133* 132*  K 3.8 3.3* 4.4  CL 89* 94* 89*  CO2 24 26 24   GLUCOSE 535* 214* 371*  BUN 30* 34* 79*  CREATININE 6.87* 7.83* 11.25*  CALCIUM 8.9 8.5* 8.2*  PHOS  --  4.3 7.8*   Liver Function Tests: Recent Labs  Lab 02/17/23 0559 02/19/23 0533  ALBUMIN 3.1* 2.8*    CBC: Recent Labs  Lab 02/16/23 1813 02/17/23 0552 02/18/23 0903 02/19/23 0533 02/20/23 0847  WBC 8.3 9.5 10.9* 9.4 9.8  NEUTROABS 6.1  --  8.9* 6.8 7.4  HGB 10.3* 8.6* 7.1* 6.4* 8.2*  HCT 28.5* 23.9* 19.5* 18.0* 22.8*  MCV 87.7 86.3 85.9 89.1 87.4  PLT 190 171 152 148* 150   CBG: Recent Labs  Lab 02/22/23 1428 02/22/23 1730 02/22/23 2132 02/23/23 0748 02/23/23 1119  GLUCAP 149* 244* 215* 167* 275*   Sepsis Labs Recent Labs  Lab 02/17/23 0552 02/18/23 0903 02/19/23 0533 02/20/23 0847  WBC 9.5 10.9* 9.4 9.8    Procedures/Studies: DG CHEST PORT 1 VIEW Result Date: 02/22/2023 CLINICAL DATA:  Shortness of breath.  Hypertension. EXAM: PORTABLE CHEST 1 VIEW COMPARISON:  Chest radiograph dated 02/16/2023. FINDINGS: No focal consolidation, pleural effusion, or pneumothorax. Mild cardiomegaly. Atherosclerotic calcification of the aorta. No acute osseous pathology. IMPRESSION: 1. No active disease. 2. Mild cardiomegaly. Electronically Signed   By: Elgie Collard M.D.   On: 02/22/2023 14:52   DG HIP UNILAT WITH PELVIS 2-3 VIEWS RIGHT Result Date: 02/17/2023 CLINICAL DATA:  Elective surgery. EXAM: DG HIP (WITH OR WITHOUT PELVIS) 2-3V RIGHT COMPARISON:  Preoperative imaging FINDINGS: Six fluoroscopic spot views of the right hip and femur submitted from the operating room. Femoral intramedullary nail with trans trochanteric and distal locking screw fixation traverse proximal  femur fracture. Fluoroscopy time 1 minutes 27 seconds. Dose 13.22 mGy. IMPRESSION: Intraoperative fluoroscopy during proximal femur fracture fixation. Electronically Signed   By: Narda Rutherford M.D.   On: 02/17/2023 10:44   DG C-Arm 1-60 Min-No Report Result Date: 02/17/2023 Fluoroscopy was utilized by the requesting physician.  No radiographic interpretation.   DG C-Arm 1-60 Min-No Report Result Date: 02/17/2023 Fluoroscopy was utilized by the requesting physician.  No radiographic interpretation.   Chest Portable 1 View Result Date: 02/16/2023 CLINICAL DATA:  Preop exam.  Fall.  Right femur fracture. EXAM: PORTABLE CHEST 1 VIEW COMPARISON:  Radiograph 06/05/2022 FINDINGS: Similar cardiomegaly. Unchanged mediastinal contours. Aortic atherosclerosis. Left lung base and costophrenic angle are excluded from the field of view. No pulmonary edema or pneumothorax. No evidence of focal airspace disease. No large pleural effusion. Left axillary stent is partially included. On limited assessment, no acute osseous findings. IMPRESSION: Chronic cardiomegaly.  No acute chest findings. Electronically Signed   By: Narda Rutherford M.D.   On: 02/16/2023 22:48   CT Head Wo Contrast Result Date: 02/16/2023 CLINICAL DATA:  fall, distracting injury EXAM: CT HEAD  WITHOUT CONTRAST CT CERVICAL SPINE WITHOUT CONTRAST TECHNIQUE: Multidetector CT imaging of the head and cervical spine was performed following the standard protocol without intravenous contrast. Multiplanar CT image reconstructions of the cervical spine were also generated. RADIATION DOSE REDUCTION: This exam was performed according to the departmental dose-optimization program which includes automated exposure control, adjustment of the mA and/or kV according to patient size and/or use of iterative reconstruction technique. COMPARISON:  None Available. FINDINGS: CT HEAD FINDINGS Brain: Cerebral ventricle sizes are concordant with the degree of cerebral volume  loss. Patchy and confluent areas of decreased attenuation are noted throughout the deep and periventricular white matter of the cerebral hemispheres bilaterally, compatible with chronic microvascular ischemic disease. Right temporal encephalomalacia. No evidence of large-territorial acute infarction. No parenchymal hemorrhage. No mass lesion. No extra-axial collection. No mass effect or midline shift. No hydrocephalus. Basilar cisterns are patent. Vascular: No hyperdense vessel. Atherosclerotic calcifications are present within the cavernous internal carotid and vertebral arteries. Skull: No acute fracture or focal lesion.  Prior right burr hole. Sinuses/Orbits: Paranasal sinuses and mastoid air cells are clear. The orbits are unremarkable. Other: None. CT CERVICAL SPINE FINDINGS Alignment: Normal. Skull base and vertebrae: Multilevel degenerative changes of the spine most prominent at the C5-C6 level with associated posterior disc osteophyte complex formation. No severe osseous foraminal or central canal stenosis. No acute fracture. No aggressive appearing focal osseous lesion or focal pathologic process. Soft tissues and spinal canal: No prevertebral fluid or swelling. No visible canal hematoma. Upper chest: Unremarkable. Other: Left neck 3.1 x 1.8 cm fluid dense lesion likely a sebaceous cyst. IMPRESSION: 1. No acute intracranial abnormality. 2. No acute displaced fracture or traumatic listhesis of the cervical spine. Electronically Signed   By: Tish Frederickson M.D.   On: 02/16/2023 20:21   CT Cervical Spine Wo Contrast Result Date: 02/16/2023 CLINICAL DATA:  fall, distracting injury EXAM: CT HEAD WITHOUT CONTRAST CT CERVICAL SPINE WITHOUT CONTRAST TECHNIQUE: Multidetector CT imaging of the head and cervical spine was performed following the standard protocol without intravenous contrast. Multiplanar CT image reconstructions of the cervical spine were also generated. RADIATION DOSE REDUCTION: This exam was  performed according to the departmental dose-optimization program which includes automated exposure control, adjustment of the mA and/or kV according to patient size and/or use of iterative reconstruction technique. COMPARISON:  None Available. FINDINGS: CT HEAD FINDINGS Brain: Cerebral ventricle sizes are concordant with the degree of cerebral volume loss. Patchy and confluent areas of decreased attenuation are noted throughout the deep and periventricular white matter of the cerebral hemispheres bilaterally, compatible with chronic microvascular ischemic disease. Right temporal encephalomalacia. No evidence of large-territorial acute infarction. No parenchymal hemorrhage. No mass lesion. No extra-axial collection. No mass effect or midline shift. No hydrocephalus. Basilar cisterns are patent. Vascular: No hyperdense vessel. Atherosclerotic calcifications are present within the cavernous internal carotid and vertebral arteries. Skull: No acute fracture or focal lesion.  Prior right burr hole. Sinuses/Orbits: Paranasal sinuses and mastoid air cells are clear. The orbits are unremarkable. Other: None. CT CERVICAL SPINE FINDINGS Alignment: Normal. Skull base and vertebrae: Multilevel degenerative changes of the spine most prominent at the C5-C6 level with associated posterior disc osteophyte complex formation. No severe osseous foraminal or central canal stenosis. No acute fracture. No aggressive appearing focal osseous lesion or focal pathologic process. Soft tissues and spinal canal: No prevertebral fluid or swelling. No visible canal hematoma. Upper chest: Unremarkable. Other: Left neck 3.1 x 1.8 cm fluid dense lesion likely a sebaceous cyst. IMPRESSION:  1. No acute intracranial abnormality. 2. No acute displaced fracture or traumatic listhesis of the cervical spine. Electronically Signed   By: Tish Frederickson M.D.   On: 02/16/2023 20:21   DG Femur Min 2 Views Right Result Date: 02/16/2023 CLINICAL DATA:  Pain  after fall.  Right hip pain. EXAM: PELVIS - 1-2 VIEW; RIGHT FEMUR 2 VIEWS COMPARISON:  None Available. FINDINGS: Pelvis: Comminuted displaced intertrochanteric right proximal femur fracture. Femoral head is well seated, no hip dislocation. No additional fracture of the pelvis. The iliac crests are excluded from the field of view. Pubic rami are intact. Pubic symphysis and sacroiliac joints are congruent. Femur: Distal femur is intact. No additional distal femur fracture. Moderate right knee osteoarthritis. Vascular calcifications are seen. IMPRESSION: Comminuted displaced intertrochanteric right proximal femur fracture. Electronically Signed   By: Narda Rutherford M.D.   On: 02/16/2023 20:08   DG Pelvis 1-2 Views Result Date: 02/16/2023 CLINICAL DATA:  Pain after fall.  Right hip pain. EXAM: PELVIS - 1-2 VIEW; RIGHT FEMUR 2 VIEWS COMPARISON:  None Available. FINDINGS: Pelvis: Comminuted displaced intertrochanteric right proximal femur fracture. Femoral head is well seated, no hip dislocation. No additional fracture of the pelvis. The iliac crests are excluded from the field of view. Pubic rami are intact. Pubic symphysis and sacroiliac joints are congruent. Femur: Distal femur is intact. No additional distal femur fracture. Moderate right knee osteoarthritis. Vascular calcifications are seen. IMPRESSION: Comminuted displaced intertrochanteric right proximal femur fracture. Electronically Signed   By: Narda Rutherford M.D.   On: 02/16/2023 20:08    Time coordinating discharge: 45 mins  SIGNED:  Carollee Herter, DO [facility] 02/23/23, 1:44 PM

## 2023-02-23 NOTE — Subjective & Objective (Addendum)
 Pt seen and examined. No SNF beds open until Monday or Tuesday. Esophagram performed yesterday due to globus sensation showed barium tablet got stuck at GE junction.  GI consulted for this AM. Pt has been NPO since midnight.  Discussed with pt and HCPOA Kathy at bedside

## 2023-02-23 NOTE — Progress Notes (Signed)
   Esophogram shows stuck barium tablet at GE junction. GI consulted for AM consult. NPO after MN in case he needs EGD with dilatation. SQ heparin on hold.  Carollee Herter, DO [facility]

## 2023-02-23 NOTE — Assessment & Plan Note (Addendum)
 02-23-2023. Continue with Toprol-xl 50 mg qday, imdur 30 mg qSun, Tues, Thurs, Sat, norvasc 10 mg daily.  02-24-2023. Stable. Should be on Imdur 30 mg daily on Non-HD days. Since he will be getting HD at [facility] on M, W, F. He should get the Imdur on T, Th, Sat, Sun  02-25-2023 stable.

## 2023-02-23 NOTE — Progress Notes (Signed)
   Spoke with pt's POA [employer]. She states pt has been having hiccups and complaints of a globus sensation in throat.  Will order some IV reglan and get upper GI series.  Pt without any N/V.  Carollee Herter, DO [facility]

## 2023-02-23 NOTE — Assessment & Plan Note (Addendum)
 02-23-2023 DVT prophylaxis SQ heparin while in the hospital. @ discharge, pt will be changed to ASA 81 mg bid per ortho. Pt is weight bearing as tolerated. 02-24-2023 discussed with ortho surgery yesterday. Keep dressing on for the next 2 days. And Then can removed and keep it open to air. When he goes to SNF next week. Wound should be kept open to air.   02-25-2023 stable. Surgical dressing can be removed tomorrow and kept open to air

## 2023-02-23 NOTE — Discharge Planning (Signed)
[facility] Patient Discharge Orders- [facility] CLINIC: [facility] (while patient is getting rehab. Originally from [facility]). Going to SNF-[facility]  Patient's name: Justin [name] Admit/DC Dates: 02/16/2023 -   Discharge Diagnoses: R hip fracture after fall -  S/P medullary nail procedure 2/15 by orthopedic surgery.   Aranesp: Given: Doesn't appear it was given here as of today (02/23/23)    Last Hgb: 8.2 PRBC's Given: Yes Date/# of units: 1 unit PRBCs 02/19/23 ESA dose for discharge: mircera 150 mcg IV q 2 weeks   Heparin change: No heparin  EDW Change: No  Bath Change: No  Access intervention/Change: No  Hectorol  change: Continue with HD  Discharge Labs: Results from 2/17:  Calcium: 8.2  Phosphorus 7.8  Albumin 2.8  K+ 4.4  IV Antibiotics: No  On Coumadin?: No   OTHER/APPTS/LAB ORDERS:    D/C Meds to be reconciled by nurse after every discharge.  Completed By: Salome Holmes, NP   Reviewed by: MD:______ RN_______

## 2023-02-23 NOTE — Progress Notes (Addendum)
Pt has been accepted at [facility] on MWF 11:30 am chair time. Pt can start on Monday and will need to arrive at 10:45 am to complete paperwork prior to treatment. Spoke to pt's emergency contact, [name], to make her aware of arrangements. [name] plans to attend pt's first HD treatment to assist with paperwork if needed. Update provided to attending, nephrologist, renal NP, and CSW. Arrangements added to AVS and CSW to provide to snf. Requested that CSW please advise snf to send hoyer pad under pt in w/c to each HD treatment so pt can be tx from w/c to HD chair. Renal NP has sent orders to clinic today. Clinic advised pt for possible d/c today or weekend and should start on Monday. Will assist as needed.   Olivia Canter Renal Navigator [phone]  Addendum at 2:25 pm: Per CSW, snf will not have a bed until Monday. Clinic made aware pt will not start Monday but possibly Wednesday.

## 2023-02-23 NOTE — Assessment & Plan Note (Addendum)
 02-16-2023 through 02-22-2023 Status post ORIF, IM nail right femur. Dr Hulda Humphrey 2/15.  Weightbearing as tolerated.  Adequate pain control with oral opiates.  Prefers tramadol. Continue to work with PT OT. DVT prophylaxis, Subcu heparin while in the hospital.  Aspirin 81 mg twice daily after starting mobilizing. 02-23-2023 s/p ORIF on 02-17-2023. Preferred opiate therapy in ESRD patients is either fentanyl or dilaudid. No use for IV dilaudid at this time. Pt is POD#6. Change to po dilaudid 1 mg q4h prn pain. Medically stable for DC to SNF 02-24-2023 discussed with ortho surgery yesterday. Keep dressing on for the next 2 days. And Then can removed and keep it open to air. When he goes to SNF next week. Wound should be kept open to air.  02-25-2023 stable. Surgical dressing can be removed tomorrow and kept open to air

## 2023-02-23 NOTE — Progress Notes (Signed)
Mobility Specialist Progress Note:    02/23/23 1200  Mobility  Activity Transferred from chair to bed  Level of Assistance +2 (takes two people)  Assistive Device Stedy  RLE Weight Bearing Per Provider Order WBAT  Activity Response Tolerated well  Mobility Referral Yes (+2)  Mobility visit 1 Mobility  Mobility Specialist Start Time (ACUTE ONLY) 1207  Mobility Specialist Stop Time (ACUTE ONLY) 1215  Mobility Specialist Time Calculation (min) (ACUTE ONLY) 8 min   Pt received in chair, requesting assistance back to bed. No complaints throughout. Pt left in bed with call bell. RN and family present.  Justin [name] Mobility Specialist Please contact via Special educational needs teacher or Rehab office at [phone]

## 2023-02-23 NOTE — TOC Progression Note (Signed)
Transition of Care [facility]) - Progression Note    Patient Details  Name: Justin [name] MRN: [id number] Date of Birth: [birth year]  Transition of Care [facility]) CM/SW Contact  Lorri Frederick, LCSW Phone Number: 02/23/2023, 2:20 PM  Clinical Narrative:   CSW spoke with [facility].  No bed available until Monday, possibly Tuesday.  Team updated. Pt and [name] updated.    Expected Discharge Plan: Skilled Nursing Facility Barriers to Discharge: SNF Pending bed offer, Continued Medical Work up  Expected Discharge Plan and Services In-house Referral: Clinical Social Work   Post Acute Care Choice: Skilled Nursing Facility Living arrangements for the past 2 months: Single Family Home                                       Social Determinants of Health (SDOH) Interventions SDOH Screenings   Food Insecurity: No Food Insecurity (02/17/2023)  Housing: Low Risk  (02/17/2023)  Transportation Needs: No Transportation Needs (02/17/2023)  Utilities: Not At Risk (02/17/2023)  Financial Resource Strain: Low Risk  (07/13/2020)   Received from [facility], [facility]  Social Connections: Socially Integrated (02/17/2023)  Tobacco Use: Low Risk  (02/17/2023)    Readmission Risk Interventions     No data to display

## 2023-02-23 NOTE — Plan of Care (Signed)
   Problem: Education: Goal: Ability to describe self-care measures that may prevent or decrease complications (Diabetes Survival Skills Education) will improve Outcome: Progressing Goal: Individualized Educational Video(s) Outcome: Progressing

## 2023-02-23 NOTE — Assessment & Plan Note (Addendum)
 02-23-2023 pt seen by nephrology and has had routine HD via left UE AVF/AVG.  CM looking for outpatient HD bed near [facility] in [place].  Pt has insurance authorization and SNF bed offer.  02-24-2023 Hemodialysis coordinator has secured spot for outpatient HD for patient. Will be M, W, F @ [facility] on MWF 11:30 am chair time. Note, this is different from his home HD scheduled which is at [facility] on TTS 5:15 am chair time. Some of his outpatient medications such as HTN will need to be adjusted to accommodate change in HD session days-of-the-week.  02-25-2023 should get HD tomorrow. Pt refused HD yesterday.  Outpatient HD arranged for SNF @  [facility] on MWF 11:30 am chair time.

## 2023-02-23 NOTE — Assessment & Plan Note (Addendum)
 02-23-2023 stable. On crestor 40 mg every day, Toprol-XL 50 mg qday, imdur 30 mg q Sun, Tues, Thurs, Sat.  02-24-2023 stable. Last [facility] 09-29-2022. Plan on PRBC transfusion on Monday, Feb 26, 2023 with/before HD.  LHC Showed:  1.  Normal right heart hemodynamics with preserved cardiac output of 8.96 L/min and cardiac index of 4.5 L/min/m (likely high flow with hemodialysis/AV fistula) 2.  Moderate aortic stenosis with mean gradient 20 mmHg, calculated valve area 2.2 cm likely overestimated with high flow state.  Valve crossed with a J-wire. 3.  Patent left main with no stenosis 4.  Patent LAD with moderate nonobstructive stenosis stable from the previous study, moderately severe first diagonal stenosis of 70% 5.  Severe proximal left circumflex stenosis, supplies very small territory myocardium 6.  Patent, large dominant RCA with mild nonobstructive plaquing and severe stenosis of the PDA branch stable from the previous study   Recommendations: Favor continued Risk analyst.  The patient has moderate aortic stenosis at most.  His coronary artery disease should be managed medically and in absence of angina.  He has branch vessel disease with significant stenoses in a diagonal branch, AV circumflex that supplies a very small vascular territory, and right PDA.

## 2023-02-23 NOTE — Assessment & Plan Note (Addendum)
 02-23-2023 stable. On crestor 40 mg daily. 02-24-2023 continue crestor 40 mg daily.

## 2023-02-23 NOTE — Assessment & Plan Note (Addendum)
Pt had hospital-acquired delirium. Multiple etiologies.  Pt mentation finally cleared and he was AxOx3 by the time he was discharged.

## 2023-02-23 NOTE — Assessment & Plan Note (Addendum)
 02-23-2023 moderate per cardiology noted 12-06-2022.  Managed by Dr. Bing Matter. 02-24-2023 stable. Last LHC/RHC 09-29-2022.  "Moderate aortic stenosis with mean gradient 20 mmHg, calculated valve area 2.2 cm likely overestimated with high flow state."

## 2023-02-23 NOTE — Assessment & Plan Note (Addendum)
 02-23-2023 pt's POA [name] mentioned pt having globus sensation. Will check esophagram to make sure there isn't a foreign body in his esophagus or a retained pill fragment. 02-24-2023 GI consult. Has been NPO since MN. Potential for EGD today +/- esophageal dilatation.  02-25-2023 has esophagitis seen on EGD.  See "Esophagitis determined by endoscopy"

## 2023-02-24 ENCOUNTER — Inpatient Hospital Stay (HOSPITAL_COMMUNITY): Payer: Medicare HMO | Admitting: Anesthesiology

## 2023-02-24 ENCOUNTER — Encounter (HOSPITAL_COMMUNITY)
Admission: EM | Disposition: A | Discharge status: Skilled Nursing Facility | Payer: Self-pay | Source: Home / Self Care | Attending: Internal Medicine

## 2023-02-24 DIAGNOSIS — A048 Other specified bacterial intestinal infections: Secondary | ICD-10-CM

## 2023-02-24 DIAGNOSIS — R09A2 Foreign body sensation, throat: Secondary | ICD-10-CM | POA: Diagnosis not present

## 2023-02-24 DIAGNOSIS — S72141K Displaced intertrochanteric fracture of right femur, subsequent encounter for closed fracture with nonunion: Secondary | ICD-10-CM | POA: Diagnosis not present

## 2023-02-24 DIAGNOSIS — R066 Hiccough: Secondary | ICD-10-CM

## 2023-02-24 DIAGNOSIS — I2510 Atherosclerotic heart disease of native coronary artery without angina pectoris: Secondary | ICD-10-CM | POA: Diagnosis not present

## 2023-02-24 DIAGNOSIS — N186 End stage renal disease: Secondary | ICD-10-CM | POA: Diagnosis not present

## 2023-02-24 DIAGNOSIS — K2970 Gastritis, unspecified, without bleeding: Secondary | ICD-10-CM

## 2023-02-24 DIAGNOSIS — K3189 Other diseases of stomach and duodenum: Secondary | ICD-10-CM

## 2023-02-24 DIAGNOSIS — D631 Anemia in chronic kidney disease: Secondary | ICD-10-CM

## 2023-02-24 DIAGNOSIS — D649 Anemia, unspecified: Secondary | ICD-10-CM | POA: Diagnosis not present

## 2023-02-24 DIAGNOSIS — K209 Esophagitis, unspecified without bleeding: Secondary | ICD-10-CM

## 2023-02-24 DIAGNOSIS — K2971 Gastritis, unspecified, with bleeding: Secondary | ICD-10-CM

## 2023-02-24 DIAGNOSIS — K2980 Duodenitis without bleeding: Secondary | ICD-10-CM

## 2023-02-24 HISTORY — PX: BIOPSY: SHX5522

## 2023-02-24 HISTORY — PX: ESOPHAGOGASTRODUODENOSCOPY (EGD) WITH PROPOFOL: SHX5813

## 2023-02-24 HISTORY — DX: Hiccough: R06.6

## 2023-02-24 LAB — RENAL FUNCTION PANEL
Albumin: 2.6 g/dL — ABNORMAL LOW (ref 3.5–5.0)
Anion gap: 19 — ABNORMAL HIGH (ref 5–15)
BUN: 83 mg/dL — ABNORMAL HIGH (ref 8–23)
CO2: 23 mmol/L (ref 22–32)
Calcium: 8.4 mg/dL — ABNORMAL LOW (ref 8.9–10.3)
Chloride: 92 mmol/L — ABNORMAL LOW (ref 98–111)
Creatinine, Ser: 9.63 mg/dL — ABNORMAL HIGH (ref 0.61–1.24)
GFR, Estimated: 5 mL/min — ABNORMAL LOW (ref >60)
Glucose, Bld: 196 mg/dL — ABNORMAL HIGH (ref 70–99)
Phosphorus: 7 mg/dL — ABNORMAL HIGH (ref 2.5–4.6)
Potassium: 4.3 mmol/L (ref 3.5–5.1)
Sodium: 134 mmol/L — ABNORMAL LOW (ref 135–145)

## 2023-02-24 LAB — CBC WITH DIFFERENTIAL/PLATELET
Abs Immature Granulocytes: 0.14 10*3/uL — ABNORMAL HIGH (ref 0.00–0.07)
Basophils Absolute: 0 10*3/uL (ref 0.0–0.1)
Basophils Relative: 0 %
Eosinophils Absolute: 0.2 10*3/uL (ref 0.0–0.5)
Eosinophils Relative: 3 %
HCT: 22 % — ABNORMAL LOW (ref 39.0–52.0)
Hemoglobin: 7.7 g/dL — ABNORMAL LOW (ref 13.0–17.0)
Immature Granulocytes: 2 %
Lymphocytes Relative: 13 %
Lymphs Abs: 1.1 10*3/uL (ref 0.7–4.0)
MCH: 30.7 pg (ref 26.0–34.0)
MCHC: 35 g/dL (ref 30.0–36.0)
MCV: 87.6 fL (ref 80.0–100.0)
Monocytes Absolute: 1 10*3/uL (ref 0.1–1.0)
Monocytes Relative: 12 %
Neutro Abs: 5.7 10*3/uL (ref 1.7–7.7)
Neutrophils Relative %: 70 %
Platelets: 271 10*3/uL (ref 150–400)
RBC: 2.51 MIL/uL — ABNORMAL LOW (ref 4.22–5.81)
RDW: 13.2 % (ref 11.5–15.5)
WBC: 8.2 10*3/uL (ref 4.0–10.5)
nRBC: 0 % (ref 0.0–0.2)

## 2023-02-24 LAB — GLUCOSE, CAPILLARY
Glucose-Capillary: 166 mg/dL — ABNORMAL HIGH (ref 70–99)
Glucose-Capillary: 155 mg/dL — ABNORMAL HIGH (ref 70–99)
Glucose-Capillary: 158 mg/dL — ABNORMAL HIGH (ref 70–99)
Glucose-Capillary: 207 mg/dL — ABNORMAL HIGH (ref 70–99)
Glucose-Capillary: 202 mg/dL — ABNORMAL HIGH (ref 70–99)

## 2023-02-24 LAB — FERRITIN: Ferritin: 5822 ng/mL — ABNORMAL HIGH (ref 24–336)

## 2023-02-24 LAB — IRON AND TIBC
Iron: 57 ug/dL (ref 45–182)
Saturation Ratios: 33 % (ref 17.9–39.5)
TIBC: 171 ug/dL — ABNORMAL LOW (ref 250–450)
UIBC: 114 ug/dL

## 2023-02-24 SURGERY — ESOPHAGOGASTRODUODENOSCOPY (EGD) WITH PROPOFOL
Anesthesia: Monitor Anesthesia Care

## 2023-02-24 MED ORDER — BUTAMBEN-TETRACAINE-BENZOCAINE 2-2-14 % EX AERO
INHALATION_SPRAY | CUTANEOUS | Status: AC
  Filled 2023-02-24: qty 20

## 2023-02-24 MED ORDER — PANTOPRAZOLE SODIUM 40 MG PO TBEC
40.0000 mg | DELAYED_RELEASE_TABLET | Freq: Two times a day (BID) | ORAL | Status: DC
  Administered 2023-02-24 – 2023-02-25 (×4): 40 mg via ORAL
  Filled 2023-02-24 (×4): qty 1

## 2023-02-24 MED ORDER — PROPOFOL 10 MG/ML IV BOLUS
INTRAVENOUS | Status: DC | PRN
  Administered 2023-02-24: 20 mg via INTRAVENOUS

## 2023-02-24 MED ORDER — SODIUM CHLORIDE 0.9 % IV SOLN: INTRAVENOUS | Status: DC | PRN

## 2023-02-24 MED ORDER — BUTAMBEN-TETRACAINE-BENZOCAINE 2-2-14 % EX AERO
INHALATION_SPRAY | CUTANEOUS | Status: DC | PRN
  Administered 2023-02-24: 1 via TOPICAL

## 2023-02-24 MED ORDER — PROPOFOL 500 MG/50ML IV EMUL
INTRAVENOUS | Status: DC | PRN
  Administered 2023-02-24: 75 ug/kg/min via INTRAVENOUS

## 2023-02-24 SURGICAL SUPPLY — 14 items

## 2023-02-24 NOTE — Interval H&P Note (Signed)
 History and Physical Interval Note:  02/24/2023 1:37 PM  Justin [name]  has presented today for surgery, with the diagnosis of Globus sensation, abnormal barium swallow study.  The various methods of treatment have been discussed with the patient and family. After consideration of risks, benefits and other options for treatment, the patient has consented to  Procedure(s) with comments: ESOPHAGOGASTRODUODENOSCOPY (EGD) WITH PROPOFOL (N/A) - Patint is s/p right hip ORIF as a surgical intervention.  The patient's history has been reviewed, patient examined, no change in status, stable for surgery.  I have reviewed the patient's chart and labs.  Questions were answered to the patient's satisfaction.     Imogene Burn

## 2023-02-24 NOTE — Progress Notes (Signed)
 [facility] Progress Note   Subjective:    Seen and examined patient at bedside. Noted he underwent a barium swallow yesterday to evaluate globus sensation. Plan for EGD on 2/24. Plan for HD this afternoon.  Objective Vitals:   02/23/23 1603 02/23/23 2003 02/24/23 0401 02/24/23 0842  BP: (!) 120/54 (!) 125/53 (!) 133/52 139/60  Pulse: 82 74 69 76  Resp: 17 20 18 17   Temp: 98.6 F (37 C) 98.4 F (36.9 C) 98.3 F (36.8 C) 98.7 F (37.1 C)  TempSrc: Oral Oral Oral Oral  SpO2: 98% 100% 100% 99%  Weight:      Height:       Physical Exam  General: Pleasant older male in NAD Heart: S1,S2 3/6 systolic M. No R/G Lungs: CTAB A/P Abdomen: NABS Extremities:Drsg R hip intact. Trace edema RU Thigh. No LLE edema.  Dialysis Access: L AVF + T/B  Filed Weights   02/19/23 1340 02/19/23 1733 02/22/23 0817  Weight: 84.1 kg 82.6 kg 84 kg    Intake/Output Summary (Last 24 hours) at 02/24/2023 1254 Last data filed at 02/23/2023 2200 Gross per 24 hour  Intake 580 ml  Output --  Net 580 ml    Additional Objective Labs: Basic Metabolic Panel: Recent Labs  Lab 02/19/23 0533 02/24/23 0449  NA 132* 134*  K 4.4 4.3  CL 89* 92*  CO2 24 23  GLUCOSE 371* 196*  BUN 79* 83*  CREATININE 11.25* 9.63*  CALCIUM 8.2* 8.4*  PHOS 7.8* 7.0*   Liver Function Tests: Recent Labs  Lab 02/19/23 0533 02/24/23 0449  ALBUMIN 2.8* 2.6*   No results for input(s): "LIPASE", "AMYLASE" in the last 168 hours. CBC: Recent Labs  Lab 02/18/23 0903 02/19/23 0533 02/20/23 0847 02/24/23 0449  WBC 10.9* 9.4 9.8 8.2  NEUTROABS 8.9* 6.8 7.4 5.7  HGB 7.1* 6.4* 8.2* 7.7*  HCT 19.5* 18.0* 22.8* 22.0*  MCV 85.9 89.1 87.4 87.6  PLT 152 148* 150 271   Blood Culture    Component Value Date/Time   SDES BLOOD RIGHT ARM 06/05/2022 1759   SPECREQUEST  06/05/2022 1759    BOTTLES DRAWN AEROBIC AND ANAEROBIC Blood Culture results may not be optimal due to an excessive volume of blood received  in culture bottles   CULT  06/05/2022 1759    NO GROWTH 5 DAYS Performed at [facility], [address], [place], [place] [address]    REPTSTATUS 06/10/2022 FINAL 06/05/2022 1759    Cardiac Enzymes: No results for input(s): "CKTOTAL", "CKMB", "CKMBINDEX", "TROPONINI" in the last 168 hours. CBG: Recent Labs  Lab 02/23/23 1119 02/23/23 1607 02/23/23 2059 02/24/23 0847 02/24/23 1123  GLUCAP 275* 275* 224* 166* 155*   Iron Studies:  Recent Labs    02/24/23 0449  IRON 57  TIBC 171*  FERRITIN 5,822*   Lab Results  Component Value Date   INR 1.1 02/16/2023   INR 1.3 (H) 04/29/2018   Studies/Results: DG ESOPHAGUS W SINGLE CM (SOL OR THIN BA) Result Date: 02/23/2023 CLINICAL DATA:  73 year old male with chronic globus sensation. Request for esophagram. EXAM: ESOPHAGUS/BARIUM SWALLOW/TABLET STUDY TECHNIQUE: Single contrast examination was performed using thin liquid barium. This exam was performed by Lawernce Ion, PA-C, and was supervised and interpreted by Richarda Overlie,. The exam was extremely limited due to patient's limited mobility and right hip. All images were taken in supine LPO position. FLUOROSCOPY: Radiation Exposure Index (as provided by the fluoroscopic device): 14.9 mGy Kerma COMPARISON:  None Available. FINDINGS: Swallowing:  No aspiration or penetration seen during this limited exam. Pharynx: Unremarkable. Esophagus: Fluoroscopic evaluation demonstrates normal caliber and smooth contour of the esophagus. No evidence of fixed stricture, mass or mucosal abnormality. Esophageal motility: Decreased.  Few tertiary contractions. Hiatal Hernia: Probable small hiatal hernia Gastroesophageal reflux: Episode of spontaneous reflux noted into the upper esophagus. Ingested 13mm barium tablet: The 13 mm barium tablet stuck in the distal esophagus above the GE junction. The tablet did not move distally with additional swallows of thin. No focal mucosal abnormality or stricture identified.  Other: None. IMPRESSION: 1. 13 mm barium tablet got stuck in the distal esophagus of uncertain etiology. There is not a gross mucosal abnormality or stricture in this area. Findings may be associated with dysmotility but consider further evaluation with endoscopy. 2. Probable small hiatal hernia. 3. Evidence for spontaneous gastroesophageal reflux. Electronically Signed   By: Richarda Overlie M.D.   On: 02/23/2023 16:43   DG CHEST PORT 1 VIEW Result Date: 02/22/2023 CLINICAL DATA:  Shortness of breath.  Hypertension. EXAM: PORTABLE CHEST 1 VIEW COMPARISON:  Chest radiograph dated 02/16/2023. FINDINGS: No focal consolidation, pleural effusion, or pneumothorax. Mild cardiomegaly. Atherosclerotic calcification of the aorta. No acute osseous pathology. IMPRESSION: 1. No active disease. 2. Mild cardiomegaly. Electronically Signed   By: Elgie Collard M.D.   On: 02/22/2023 14:52    Medications:   amLODipine  10 mg Oral Daily   bisacodyl  10 mg Rectal Once   Chlorhexidine Gluconate Cloth  6 each Topical Q0600   darbepoetin (ARANESP) injection - DIALYSIS  100 mcg Subcutaneous Q Sat-1800   docusate sodium  100 mg Oral BID   feeding supplement (NEPRO CARB STEADY)  237 mL Oral TID BM   ferric citrate  630 mg Oral TID WC   insulin aspart  0-5 Units Subcutaneous QHS   insulin aspart  0-6 Units Subcutaneous TID WC   insulin aspart  3 Units Subcutaneous TID WC   insulin glargine-yfgn  12 Units Subcutaneous QHS   isosorbide mononitrate  30 mg Oral Once per day on Sunday Tuesday Thursday Saturday   lactulose  20 g Oral BID   methocarbamol  500 mg Oral Q12H   metoprolol succinate  50 mg Oral Daily   multivitamin  1 tablet Oral QHS   rosuvastatin  40 mg Oral Daily    Dialysis Orders: TTS [place] 4h  B450  81.1kg   2K bath  L AVF  Heparin none  - last HD 2/13, post wt 81.6kg, gets to dry wt, wt gain ~ 3kg - hectorol 2 mcg IV  - no esa - last Hb 10.5, ferritin 1605, pth 222    Renal-related home meds: -  ferric citrate 3 ac tid - hydralazine 50 tid  - norvasc 10 - metoprolol xl 50 every day - others: imdur, sl ntg prn, statin, novolin insulin, asa  Assessment/Plan: R hip fracture - after a fall. S/P medullary nail procedure done today 2/15 by orthopedic surgery. PT now working with patient. Globus sensation - apparently had an abnormal barium swallow yesterday. GI consulted and plan for an EGD on 2/24.  ESRD - on HD TTS. Pt is stable, no vol or lab issues. Patient prefers to have his treatment to be done in the afternoon so he's not worn out when working with PT. Will attempt to schedule but also advised him that this is not always guaranteed. Next HD this afternoon. HTN - BP's currently acceptable. Continue current medications Volume - Euvolemic on exam.  UF as tolerated. Anemia of esrd -  Hgb 7.7. Iron studies from 2/22: Iron 57, Tsat 33%, and Ferritin 5822. ESA scheduled to be given today. Titrate up dose if needed. Transfuse for Hgb < 7. Secondary hyperparathyroidism - Ca and phos in range but alb is low, on Nepro. Cont binders w/ meals and IV vdra.   Salome Holmes, NP [facility] 02/24/2023,12:54 PM  LOS: 8 days

## 2023-02-24 NOTE — Assessment & Plan Note (Signed)
 02-24-2023 on IV reglan. May be due to GE stricture or what ever is causing barium tablet obstruction. GI to evaluate this today with EGD.

## 2023-02-24 NOTE — Transfer of Care (Signed)
 Immediate Anesthesia Transfer of Care Note  Patient: Justin [name]  Procedure(s) Performed: ESOPHAGOGASTRODUODENOSCOPY (EGD) WITH PROPOFOL BIOPSY  Patient Location: PACU  Anesthesia Type:MAC  Level of Consciousness: drowsy and patient cooperative  Airway & Oxygen Therapy: Patient Spontanous Breathing  Post-op Assessment: Report given to RN and Post -op Vital signs reviewed and stable  Post vital signs: Reviewed and stable  Last Vitals:  Vitals Value Taken Time  BP 142/64 02/24/23 1456  Temp    Pulse 66 02/24/23 1459  Resp 18 02/24/23 1459  SpO2 99 % 02/24/23 1459  Vitals shown include unfiled device data.  Last Pain:  Vitals:   02/24/23 1409  TempSrc: Temporal  PainSc: 0-No pain      Patients Stated Pain Goal: 0 (02/23/23 0600)  Complications: No notable events documented.

## 2023-02-24 NOTE — Anesthesia Postprocedure Evaluation (Signed)
 Anesthesia Post Note  Patient: Justin [name]  Procedure(s) Performed: ESOPHAGOGASTRODUODENOSCOPY (EGD) WITH PROPOFOL BIOPSY     Patient location during evaluation: PACU Anesthesia Type: MAC Level of consciousness: awake and alert Pain management: pain level controlled Vital Signs Assessment: post-procedure vital signs reviewed and stable Respiratory status: spontaneous breathing Cardiovascular status: stable Anesthetic complications: no   No notable events documented.  Last Vitals:  Vitals:   02/24/23 1500 02/24/23 1515  BP: (!) 142/53 (!) 130/53  Pulse: 65 64  Resp: (!) 26 (!) 23  Temp:  37 C  SpO2: 98% 93%    Last Pain:  Vitals:   02/24/23 1515  TempSrc:   PainSc: 0-No pain                 Lewie Loron

## 2023-02-24 NOTE — Op Note (Signed)
 [facility] Patient Name: Justin [name] Procedure Date : 02/24/2023 MRN: [id number] Attending MD: Particia Lather , , [id number] Date of Birth: 04/21/1950 CSN: [id number] Age: 73 Admit Type: Inpatient Procedure:                Upper GI endoscopy Indications:              Abnormal barium swallow, Globus sensation Providers:                Madelyn Brunner" Francine Graven, RN, Alan Ripper,                            Technician Referring MD:             Hospitalist team Medicines:                Monitored Anesthesia Care Complications:            No immediate complications. Estimated Blood Loss:     Estimated blood loss was minimal. Procedure:                Pre-Anesthesia Assessment:                           - Prior to the procedure, a History and Physical                            was performed, and patient medications and                            allergies were reviewed. The patient's tolerance of                            previous anesthesia was also reviewed. The risks                            and benefits of the procedure and the sedation                            options and risks were discussed with the patient.                            All questions were answered, and informed consent                            was obtained. Prior Anticoagulants: The patient has                            taken no anticoagulant or antiplatelet agents. ASA                            Grade Assessment: III - A patient with severe                            systemic disease. After reviewing the risks and  benefits, the patient was deemed in satisfactory                            condition to undergo the procedure.                           After obtaining informed consent, the endoscope was                            passed under direct vision. Throughout the                            procedure, the patient's blood pressure, pulse, and                             oxygen saturations were monitored continuously. The                            [id number]-[id number] ([id number]) Olympus endoscope was introduced                            through the mouth, and advanced to the second part                            of duodenum. The upper GI endoscopy was                            accomplished without difficulty. The patient                            tolerated the procedure well. Scope In: Scope Out: Findings:      LA Grade D (one or more mucosal breaks involving at least 75% of       esophageal circumference) esophagitis with no bleeding was found in the       distal esophagus. Biopsies were taken with a cold forceps for histology.      Localized inflammation with hemorrhage characterized by congestion       (edema), erosions and erythema was found in the gastric body. Biopsies       were taken with a cold forceps for histology.      A single 10 mm submucosal nodule with a localized distribution was found       in the second portion of the duodenum. Biopsies were taken with a cold       forceps for histology. Impression:               - LA Grade D esophagitis with no bleeding. Biopsied.                           - Gastritis with hemorrhage. Biopsied.                           - Submucosal nodule found in the duodenum. Biopsied. Recommendation:           - Return patient to hospital ward for ongoing care.                           -  Await pathology results.                           - PPI BID for 12 weeks, then QD.                           - Repeat upper endoscopy in 2-3 months to check                            healing.                           - We will arrange for outpatient GI follow up.                           - The findings and recommendations were discussed                            with the patient. Procedure Code(s):        --- Professional ---                           [phone], Esophagogastroduodenoscopy, flexible,                             transoral; with biopsy, single or multiple Diagnosis Code(s):        --- Professional ---                           K20.90, Esophagitis, unspecified without bleeding                           K29.71, Gastritis, unspecified, with bleeding                           K31.89, Other diseases of stomach and duodenum                           F45.8, Other somatoform disorders                           R93.3, Abnormal findings on diagnostic imaging of                            other parts of digestive tract CPT copyright 2022 [employer]. All rights reserved. The codes documented in this report are preliminary and upon coder review may  be revised to meet current compliance requirements. Dr Particia Lather [address] [place],  02/24/2023 2:58:47 PM Number of Addenda: 0

## 2023-02-24 NOTE — Anesthesia Preprocedure Evaluation (Addendum)
 Anesthesia Evaluation  Patient identified by MRN, date of birth, ID band Patient awake    Reviewed: Allergy & Precautions, NPO status , Patient's Chart, lab work & pertinent test results  Airway Mallampati: II  TM Distance: >3 FB Neck ROM: Full    Dental  (+) Dental Advisory Given, Teeth Intact   Pulmonary shortness of breath   Pulmonary exam normal breath sounds clear to auscultation       Cardiovascular hypertension, Pt. on home beta blockers and Pt. on medications + CAD and +CHF  + Valvular Problems/Murmurs AS and AI  Rhythm:Regular Rate:Normal + Systolic murmurs Cath 09/2022 1.  Normal right heart hemodynamics with preserved cardiac output of 8.96 L/min and cardiac index of 4.5 L/min/m (likely high flow with hemodialysis/AV fistula) 2.  Moderate aortic stenosis with mean gradient 20 mmHg, calculated valve area 2.2 cm likely overestimated with high flow state.  Valve crossed with a J-wire. 3.  Patent left main with no stenosis 4.  Patent LAD with moderate nonobstructive stenosis stable from the previous study, moderately severe first diagonal stenosis of 70% 5.  Severe proximal left circumflex stenosis, supplies very small territory myocardium 6.  Patent, large dominant RCA with mild nonobstructive plaquing and severe stenosis of the PDA branch stable from the previous study   Recommendations: Favor continued Risk analyst.  The patient has moderate aortic stenosis at most.  His coronary artery disease should be managed medically and in absence of angina.  He has branch vessel disease with significant stenoses in a diagonal branch, AV circumflex that supplies a very small vascular territory, and right PDA.   Echo 08/30/22:  1. Technically difficult study.   2. Left ventricular ejection fraction, by estimation, is 60 to 65%.The left ventricle has no regional wall motion abnormalities. There is moderate concentric left  ventricular hypertrophy. Left ventricular diastolic parameters are consistent with Grade I diastolic dysfunction (impaired relaxation). The average left ventricular global longitudinal strain is -14.4%   3. Right ventricular systolic function is normal. The right ventricular size is normal.   4. LA volume index 45.4 mL/m. Left atrial size was severely dilated.   5. Trivial mitral valve regurgitation.   6. Low-flow low gradient aortic stenosis, paradoxical, severe by valve area calculated, visually appears moderately stenotic. Aortic valve area, by VTI measures 0.85 cm. Aortic valve mean gradient measures 19.0 mmHg. Aortic valve Vmax measures 2.92 m/s Stroke-volume index 29.4 mL/m. Mild aortic insufficiency.   7. The inferior vena cava is normal in size with greater than 50% respiratory variability, suggesting right atrial pressure of 3 mmHg.   8. A small to moderate size pericardial effusion is present. The pericardial effusion is circumferential. There is no evidence of cardiac tamponade.     Neuro/Psych  PSYCHIATRIC DISORDERS      negative neurological ROS     GI/Hepatic negative GI ROS, Neg liver ROS,,,  Endo/Other  diabetes, Type 2, Insulin Dependent    Renal/GU ESRFRenal disease (TTS HD)     Musculoskeletal R HIP FRACTURE   Abdominal   Peds  Hematology  (+) Blood dyscrasia, anemia   Anesthesia Other Findings Day of surgery medications reviewed with the patient.  Reproductive/Obstetrics                             Anesthesia Physical Anesthesia Plan  ASA: 4  Anesthesia Plan: MAC   Post-op Pain Management:    Induction: Intravenous  PONV Risk Score and Plan:  1 and Ondansetron, Treatment may vary due to age or medical condition, Propofol infusion and TIVA  Airway Management Planned: Natural Airway  Additional Equipment:   Intra-op Plan:   Post-operative Plan:   Informed Consent: I have reviewed the patients History and Physical, chart,  labs and discussed the procedure including the risks, benefits and alternatives for the proposed anesthesia with the patient or authorized representative who has indicated his/her understanding and acceptance.     Dental advisory given  Plan Discussed with: CRNA  Anesthesia Plan Comments:         Anesthesia Quick Evaluation

## 2023-02-24 NOTE — Progress Notes (Signed)
 Per unit RN nephrology ok for pt to skip HD due to not feeling well after procedure

## 2023-02-24 NOTE — Consult Note (Signed)
 Referring Provider: Dr. Carollee Herter Primary Care Physician:  Adrian Prince, MD Primary Gastroenterologist: Gentry Fitz  Reason for Consultation: Globus sensation, abnormal barium swallow study  HPI: Justin [name] is a 73 y.o. male with a past medical history of hypertension, hyperlipidemia, CHF, CAD, moderate AS, diabetes mellitus type 2, ESRD on HD every TTS, BPH and colon polyps who was admitted to the hospital for evaluation of right hip pain after a fall diagnosed with a closed comminuted intertrochanteric fracture of proximal end of the right femur with nonunion s/p ORIF 02/17/2023.  He noted having globus sensation one day post-op without dysphagia. A barium swallow study 02/23/2023 is abnormal, the barium tablet got stuck in the distal esophagus which may be due to dysmotility, a stricture was not identified and there was evidence of a small hiatal hernia and GERD.  A GI consult was requested for further evaluation and for consideration for an EGD during this hospitalization.  He denies having any acid reflux symptoms prior or during this hospitalization.  No difficulty swallowing solid food, liquids or pills.  He describes feeling a lump sensation in his throat, feels like something is stuck which started 1 day status post right hip ORIF surgery.  He also endorses having frequent hiccups for the past few days.  No nausea or vomiting.  No upper or lower abdominal pain.  Prior to admission, he typically passes a normal brown bowel movement most days.  No bloody stools.  His stools are typically dark since taking ferric citrate.  He takes ASA 81 mg daily at home.  No other NSAIDs.  No recent steroids or antibiotics. Never had an EGD.  Non-smoker.  No alcohol use.  He noted decreased appetite for the past month, no noticeable weight loss.  No fevers or night sweats.  Last dialysis session was Thursday 02/22/2023.  His wife is at the bedside.  Admission Hg 10.3 -> 8.6 -> 7.1 -> 6.4 on 2/17 ->  transfused one unit of PRBCs -> 8.2 -> today Hg 7.7.  No overt GI bleeding.  ECHO 08/30/2022: IMPRESSIONS 1. Technically difficult study. Left ventricular ejection fraction, by estimation, is 60 to 65%.The left ventricle has no regional wall motion abnormalities. There is moderate concentric left ventricular hypertrophy. Left ventricular diastolic parameters are consistent with Grade I diastolic dysfunction (impaired relaxation). The average left ventricular global longitudinal strain is -14.4 % 2. 3. Right ventricular systolic function is normal. The right ventricular size is normal. 4. LA volume index 45.4 mL/m. Left atrial size was severely dilated. 5. Trivial mitral valve regurgitation. Low-flow low gradient aortic stenosis, paradoxical, severe by valve area calculated, visually appears moderately stenotic. Aortic valve area, by VTI measures 0.85 cm. Aortic valve mean gradient measures 19.0 mmHg. Aortic valve Vmax measures 2.92 m/s Stroke-volume index 29.4 mL/m. Mild aortic insufficiency. 6. The inferior vena cava is normal in size with greater than 50% respiratory variability, suggesting right atrial pressure of 3 mmHg. 7. A small to moderate size pericardial effusion is present. The pericardial effusion is circumferential. There is no evidence of cardiac tamponade.  PAST GI PROCEDURES:  Colonoscopy 11/24/2019 at [facility]:   Past Medical History:  Diagnosis Date   BPH (benign prostatic hyperplasia)    Diabetes (HCC)    Dysuria    Erectile dysfunction    ESRD on hemodialysis (HCC)    History of chronic CHF 11/19/2019   Hyperlipemia    Hypertension    Personal history of COVID-19 11/19/2019   Pinna disorder, left  Wears glasses     Past Surgical History:  Procedure Laterality Date   AV FISTULA PLACEMENT Left 04/30/2018   Procedure: CREATION OF RADIOCEPHALIC ARTERIOVENOUS FISTULA LEFT ARM;  Surgeon: Sherren Kerns, MD;  Location: [facility] OR;  Service: Vascular;  Laterality:  Left;   AV FISTULA PLACEMENT Left 08/28/2018   Procedure: ARTERIOVENOUS (AV) BRACHIOCEPHALIC FISTULA CREATION LEFT UPPER ARM;  Surgeon: Cephus Shelling, MD;  Location: [facility] OR;  Service: Vascular;  Laterality: Left;   INTRAMEDULLARY (IM) NAIL INTERTROCHANTERIC Right 02/17/2023   Procedure: INTRAMEDULLARY (IM) NAIL INTERTROCHANTERIC;  Surgeon: Luci Bank, MD;  Location: [facility] OR;  Service: Orthopedics;  Laterality: Right;   IR FLUORO GUIDE CV LINE RIGHT  04/29/2018   IR US GUIDE VASC ACCESS RIGHT  04/29/2018   RIGHT/LEFT HEART CATH AND CORONARY ANGIOGRAPHY N/A 05/01/2018   Procedure: RIGHT/LEFT HEART CATH AND CORONARY ANGIOGRAPHY;  Surgeon: Lyn Records, MD;  Location: [facility] INVASIVE CV LAB;  Service: Cardiovascular;  Laterality: N/A;   RIGHT/LEFT HEART CATH AND CORONARY ANGIOGRAPHY N/A 09/29/2022   Procedure: RIGHT/LEFT HEART CATH AND CORONARY ANGIOGRAPHY;  Surgeon: Tonny Bollman, MD;  Location: [facility] INVASIVE CV LAB;  Service: Cardiovascular;  Laterality: N/A;   subdural hematoma Right 07/2020    Prior to Admission medications   Medication Sig Start Date End Date Taking? Authorizing Provider  amLODipine (NORVASC) 10 MG tablet Take 1 tablet by mouth once daily 02/12/23  Yes Georgeanna Lea, MD  cyanocobalamin (VITAMIN B12) 1000 MCG tablet Take 1,000 mcg by mouth daily.   Yes [provider]  ferric citrate (AURYXIA) 1 GM 210 MG(Fe) tablet Take 630 mg by mouth 3 (three) times daily with meals.   Yes [provider]  isosorbide mononitrate (IMDUR) 30 MG 24 hr tablet Take 1 tablet (30 mg total) by mouth 4 (four) times a week. Mon, Wed, Fri, Sun (non-dialysis days) 10/21/22  Yes Georgeanna Lea, MD  metoCLOPramide (REGLAN) 10 MG tablet Take 1 tablet (10 mg total) by mouth every 6 (six) hours as needed for nausea or vomiting (hiccups). 02/23/23 03/25/23 Yes Carollee Herter, DO  metoprolol succinate (TOPROL-XL) 50 MG 24 hr tablet Take 50 mg by mouth daily.   Yes [provider]  NOVOLIN 70/30 RELION (70-30) 100 UNIT/ML injection Inject 20 Units into the skin daily. 05/02/18  Yes Albertine Grates, MD  ondansetron (ZOFRAN) 4 MG tablet Take 1 tablet (4 mg total) by mouth every 6 (six) hours as needed for nausea or vomiting. 02/23/23 02/23/24 Yes Carollee Herter, DO  rosuvastatin (CRESTOR) 40 MG tablet Take 40 mg by mouth daily. 01/22/22  Yes [provider]  acetaminophen (TYLENOL) 500 MG tablet Take 2 tablets (1,000 mg total) by mouth every 6 (six) hours as needed for mild pain (pain score 1-3), fever or headache. 02/23/23   Carollee Herter, DO  aspirin EC 81 MG tablet Take 1 tablet (81 mg total) by mouth in the morning and at bedtime for 42 days, THEN 1 tablet (81 mg total) daily. Swallow whole.. 02/23/23 07/05/23  Carollee Herter, DO  bisacodyl (DULCOLAX) 5 MG EC tablet Take 1 tablet (5 mg total) by mouth daily as needed for moderate constipation. 02/23/23   Carollee Herter, DO  docusate sodium (COLACE) 100 MG capsule Take 1 capsule (100 mg total) by mouth 2 (two) times daily. 02/23/23   Carollee Herter, DO  hydrALAZINE (APRESOLINE) 25 MG tablet Take 2 tablets (50 mg total) by mouth See admin instructions. Take 50 mg 3 times daily on  M, W, F(dialysis days) Take 50 mg twice daily on Tu, Th, Sat, Sun(non-dialysis days) 02/23/23   Carollee Herter, DO  HYDROmorphone (DILAUDID) 2 MG tablet Take 0.5 tablets (1 mg total) by mouth every 6 (six) hours as needed for severe pain (pain score 7-10) or moderate pain (pain score 4-6). 02/23/23 03/25/23  Carollee Herter, DO  insulin aspart (NOVOLOG) 100 UNIT/ML injection Inject 0-6 Units into the skin 3 (three) times daily with meals. CBG 70 - 120: 0 units CBG 121 - 150: 0 units CBG 151 - 200: 1 unit CBG 201-250: 2 units CBG 251-300: 3 units CBG 301-350: 4 units CBG 351-400: 5 units CBG > 400: Give 6 units and call MD 02/23/23   Carollee Herter, DO  insulin aspart (NOVOLOG) 100 UNIT/ML injection Inject 3 Units into the skin 3 (three) times daily with meals. 02/23/23   Carollee Herter, DO  insulin glargine-yfgn (SEMGLEE) 100 UNIT/ML injection Inject 0.12 mLs (12 Units total) into the skin at bedtime. 02/23/23   Carollee Herter, DO  lactulose (CHRONULAC) 10 GM/15ML solution Take 30 mLs (20 g total) by mouth 2 (two) times daily. 02/23/23   Carollee Herter, DO  methocarbamol (ROBAXIN) 500 MG tablet Take 1 tablet (500 mg total) by mouth every 8 (eight) hours as needed for muscle spasms. 02/23/23 03/25/23  Carollee Herter, DO  Methoxy PEG-Epoetin Beta (MIRCERA IJ)  09/12/22 09/11/23  [provider]  multivitamin (RENA-VIT) TABS tablet Take 1 tablet by mouth at bedtime. 02/23/23   Carollee Herter, DO  nitroGLYCERIN (NITROSTAT) 0.4 MG SL tablet Place 0.4 mg under the tongue every 5 (five) minutes as needed for chest pain. Patient not taking: Reported on 02/16/2023    [provider]  simethicone (MYLICON) 80 MG chewable tablet Chew 2 tablets (160 mg total) by mouth every 6 (six) hours as needed for flatulence. 02/23/23   Carollee Herter, DO    Current Facility-Administered Medications  Medication Dose Route Frequency Provider Last Rate Last Admin   acetaminophen (TYLENOL) tablet 650 mg  650 mg Oral Q6H PRN Charlsie Quest, MD   650 mg at 02/21/23 1218   amLODipine (NORVASC) tablet 10 mg  10 mg Oral Daily Charlsie Quest, MD   10 mg at 02/24/23 1610   bisacodyl (DULCOLAX) EC tablet 5 mg  5 mg Oral Daily PRN Charlsie Quest, MD       bisacodyl (DULCOLAX) suppository 10 mg  10 mg Rectal Once Dahal, Melina Schools, MD       Chlorhexidine Gluconate Cloth 2 % PADS 6 each  6 each Topical Q0600 Berenda Morale, NP   6 each at 02/24/23 9604   chlorproMAZINE (THORAZINE) tablet 25 mg  25 mg Oral TID PRN Dorcas Carrow, MD   25 mg at 02/23/23 1635   Darbepoetin Alfa (ARANESP) injection 100 mcg  100 mcg Subcutaneous Q Sat-1800 Estill Batten W, RPH       docusate sodium (COLACE) capsule 100 mg  100 mg Oral BID Dorcas Carrow, MD   100 mg at 02/24/23 0904   feeding supplement (NEPRO CARB STEADY) liquid 237 mL   237 mL Oral TID BM Dorcas Carrow, MD   237 mL at 02/23/23 2127   ferric citrate (AURYXIA) tablet 630 mg  630 mg Oral TID WC Dorcas Carrow, MD   630 mg at 02/23/23 1635   HYDROmorphone (DILAUDID) tablet 1 mg  1 mg Oral Q6H PRN Carollee Herter, DO       insulin aspart (novoLOG) injection 0-5  Units  0-5 Units Subcutaneous QHS Dorcas Carrow, MD   2 Units at 02/23/23 2125   insulin aspart (novoLOG) injection 0-6 Units  0-6 Units Subcutaneous TID WC Dorcas Carrow, MD   1 Units at 02/24/23 0848   insulin aspart (novoLOG) injection 3 Units  3 Units Subcutaneous TID WC Dorcas Carrow, MD   3 Units at 02/24/23 0849   insulin glargine-yfgn [facility]) injection 12 Units  12 Units Subcutaneous QHS Dorcas Carrow, MD   12 Units at 02/23/23 2125   isosorbide mononitrate (IMDUR) 24 hr tablet 30 mg  30 mg Oral Once per day on Sunday Tuesday Thursday Saturday Charlsie Quest, MD   30 mg at 02/24/23 8413   lactulose (CHRONULAC) 10 GM/15ML solution 20 g  20 g Oral BID Dorcas Carrow, MD   20 g at 02/23/23 2121   LORazepam (ATIVAN) tablet 0.5 mg  0.5 mg Oral Q6H PRN Dorcas Carrow, MD   0.5 mg at 02/20/23 2205   methocarbamol (ROBAXIN) tablet 500 mg  500 mg Oral Q12H Pola Corn, NP   500 mg at 02/23/23 2121   metoCLOPramide (REGLAN) injection 5 mg  5 mg Intravenous QID PRN Carollee Herter, DO   5 mg at 02/23/23 1638   metoprolol succinate (TOPROL-XL) 24 hr tablet 50 mg  50 mg Oral Daily Charlsie Quest, MD   50 mg at 02/24/23 2440   multivitamin (RENA-VIT) tablet 1 tablet  1 tablet Oral QHS Dorcas Carrow, MD   1 tablet at 02/23/23 2121   ondansetron (ZOFRAN) injection 4 mg  4 mg Intravenous Q6H PRN Charlsie Quest, MD       rosuvastatin (CRESTOR) tablet 40 mg  40 mg Oral Daily Charlsie Quest, MD   40 mg at 02/24/23 1027   senna-docusate (Senokot-S) tablet 1 tablet  1 tablet Oral QHS PRN Charlsie Quest, MD       simethicone (MYLICON) chewable tablet 160 mg  160 mg Oral Q6H PRN Carollee Herter, DO   160 mg at 02/23/23  1635    Allergies as of 02/16/2023   (No Known Allergies)    Family History  Problem Relation Age of Onset   Stroke Mother    Diabetes Father     Social History   Socioeconomic History   Marital status: Divorced    Spouse name: Not on file   Number of children: 1   Years of education: Not on file   Highest education level: Not on file  Occupational History   Not on file  Tobacco Use   Smoking status: Never   Smokeless tobacco: Never  Vaping Use   Vaping status: Never Used  Substance and Sexual Activity   Alcohol use: Not Currently   Drug use: Never   Sexual activity: Not Currently  Other Topics Concern   Not on file  Social History Narrative   Not on file   Social Drivers of Health   Financial Resource Strain: Low Risk  (07/13/2020)   Received from [facility], [facility]   Overall Financial Resource Strain (CARDIA)    Difficulty of Paying Living Expenses: Not very hard  Food Insecurity: No Food Insecurity (02/17/2023)   Hunger Vital Sign    Worried About Running Out of Food in the Last Year: Never true    Ran Out of Food in the Last Year: Never true  Transportation Needs: No Transportation Needs (02/17/2023)   PRAPARE - Administrator, Civil Service (Medical):  No    Lack of Transportation (Non-Medical): No  Physical Activity: Not on file  Stress: Not on file  Social Connections: Socially Integrated (02/17/2023)   Social Connection and Isolation Panel [NHANES]    Frequency of Communication with Friends and Family: More than three times a week    Frequency of Social Gatherings with Friends and Family: More than three times a week    Attends Religious Services: More than 4 times per year    Active Member of [employer] or Organizations: Yes    Attends Banker Meetings: 1 to 4 times per year    Marital Status: Living with partner  Intimate Partner Violence: Not At Risk (02/17/2023)   Humiliation, Afraid, Rape, and Kick questionnaire     Fear of Current or Ex-Partner: No    Emotionally Abused: No    Physically Abused: No    Sexually Abused: No   Review of Systems: Gen: Denies fever, sweats or chills. No weight loss.  CV: Denies chest pain, palpitations or edema. Resp: Denies cough, shortness of breath of hemoptysis.  GI:See HPI. GU : Denies urinary burning, blood in urine, increased urinary frequency or incontinence. MS:+ Right hip fracture/pain. Derm: RLE bruising. Psych: Denies depression, anxiety, memory loss or confusion. Heme: Denies easy bruising, bleeding. Neuro:  Denies headaches, dizziness or paresthesias. Endo:  Denies any problems with DM, thyroid or adrenal function.  Physical Exam: Vital signs in last 24 hours: Temp:  [98.3 F (36.8 C)-98.7 F (37.1 C)] 98.7 F (37.1 C) (02/22 0842) Pulse Rate:  [69-82] 76 (02/22 0842) Resp:  [17-20] 17 (02/22 0842) BP: (120-139)/(52-60) 139/60 (02/22 0842) SpO2:  [98 %-100 %] 99 % (02/22 0842) Last BM Date :  (PTA) General:  Alert 73 year old male fatigued.  In no acute distress. Head:  Normocephalic and atraumatic. Eyes:  No scleral icterus. Conjunctiva pink. Ears:  Normal auditory acuity. Nose:  No deformity, discharge or lesions. Mouth:  Dentition intact. No ulcers or lesions.  Neck:  Supple. No lymphadenopathy or thyromegaly.  Lungs: Breath sounds clear throughout. No wheezes, rhonchi or crackles.  Heart: Regular rate and rhythm, no murmurs. Abdomen: Soft, nondistended.  Nontender.  Positive bowel sounds all 4 quadrants. Rectal: Deferred. Musculoskeletal: Right hip dressing dry and intact. Pulses:  Normal pulses noted. Extremities: No edema, moderate ecchymosis to the distal left lower extremity. Neurologic:  Alert and  oriented x 4. No focal deficits.  Skin:  Intact without significant lesions or rashes. Psych:  Alert and cooperative. Normal mood and affect.  Intake/Output from previous day: 02/21 0701 - 02/22 0700 In: 820 [P.O.:820] Out: -   Intake/Output this shift: No intake/output data recorded.  Lab Results: Recent Labs    02/24/23 0449  WBC 8.2  HGB 7.7*  HCT 22.0*  PLT 271   BMET Recent Labs    02/24/23 0449  NA 134*  K 4.3  CL 92*  CO2 23  GLUCOSE 196*  BUN 83*  CREATININE 9.63*  CALCIUM 8.4*   LFT Recent Labs    02/24/23 0449  ALBUMIN 2.6*   PT/INR No results for input(s): "LABPROT", "INR" in the last 72 hours. Hepatitis Panel No results for input(s): "HEPBSAG", "HCVAB", "HEPAIGM", "HEPBIGM" in the last 72 hours.    Studies/Results: DG ESOPHAGUS W SINGLE CM (SOL OR THIN BA) Result Date: 02/23/2023 CLINICAL DATA:  73 year old male with chronic globus sensation. Request for esophagram. EXAM: ESOPHAGUS/BARIUM SWALLOW/TABLET STUDY TECHNIQUE: Single contrast examination was performed using thin liquid barium. This exam was performed  by Lawernce Ion, PA-C, and was supervised and interpreted by Richarda Overlie,. The exam was extremely limited due to patient's limited mobility and right hip. All images were taken in supine LPO position. FLUOROSCOPY: Radiation Exposure Index (as provided by the fluoroscopic device): 14.9 mGy Kerma COMPARISON:  None Available. FINDINGS: Swallowing: No aspiration or penetration seen during this limited exam. Pharynx: Unremarkable. Esophagus: Fluoroscopic evaluation demonstrates normal caliber and smooth contour of the esophagus. No evidence of fixed stricture, mass or mucosal abnormality. Esophageal motility: Decreased.  Few tertiary contractions. Hiatal Hernia: Probable small hiatal hernia Gastroesophageal reflux: Episode of spontaneous reflux noted into the upper esophagus. Ingested 13mm barium tablet: The 13 mm barium tablet stuck in the distal esophagus above the GE junction. The tablet did not move distally with additional swallows of thin. No focal mucosal abnormality or stricture identified. Other: None. IMPRESSION: 1. 13 mm barium tablet got stuck in the distal esophagus of  uncertain etiology. There is not a gross mucosal abnormality or stricture in this area. Findings may be associated with dysmotility but consider further evaluation with endoscopy. 2. Probable small hiatal hernia. 3. Evidence for spontaneous gastroesophageal reflux. Electronically Signed   By: Richarda Overlie M.D.   On: 02/23/2023 16:43   DG CHEST PORT 1 VIEW Result Date: 02/22/2023 CLINICAL DATA:  Shortness of breath.  Hypertension. EXAM: PORTABLE CHEST 1 VIEW COMPARISON:  Chest radiograph dated 02/16/2023. FINDINGS: No focal consolidation, pleural effusion, or pneumothorax. Mild cardiomegaly. Atherosclerotic calcification of the aorta. No acute osseous pathology. IMPRESSION: 1. No active disease. 2. Mild cardiomegaly. Electronically Signed   By: Elgie Collard M.D.   On: 02/22/2023 14:52    IMPRESSION/PLAN:  73 year old male admitted to the hospital 02/16/2023 with a right hip injury, closed comminuted intertrochanteric fracture of proximal end of the right femur with nonunion s/p  ORIF 02/17/2023. -Discharge to SNF when bed available  New onset globus sensation. Barium swallow study 2/21 showed the barium tablet got stuck in the distal esophagus which may be due to dysmotility, a stricture was not identified and there was evidence of a small hiatal hernia and GERD.  -NPO -EGD benefits and risks discussed including risk with sedation, risk of bleeding, perforation and infection  -Further recommendations to be determined after EGD completed  Acute on chronic anemia in setting of s/p ORIF and ESRD. Admission Hg 10.3 -> 8.6 -> 7.1 -> 6.4 on 2/17 -> transfused one unit of PRBCs -> 8.2 -> today Hg 7.7.  Iron 57.  TIBC 171.  Ferritin 5,822. -Recommend PRBC transfusion to maintain hemoglobin > 8 secondary to history of CAD  History of CAD and AS. No angina.  ESRD on HD, last dialysis session was on Thursday 2/20.  Due for dialysis today. Cr 9.63. Na= 134. K+ 4.3.  DM type II    Arnaldo Natal  02/24/2023, 110:07 AM

## 2023-02-24 NOTE — H&P (View-Only) (Signed)
 Referring Provider: Dr. Carollee Herter Primary Care Physician:  Adrian Prince, MD Primary Gastroenterologist: Gentry Fitz  Reason for Consultation: Globus sensation, abnormal barium swallow study  HPI: Justin [name] is a 73 y.o. male with a past medical history of hypertension, hyperlipidemia, CHF, CAD, moderate AS, diabetes mellitus type 2, ESRD on HD every TTS, BPH and colon polyps who was admitted to the hospital for evaluation of right hip pain after a fall diagnosed with a closed comminuted intertrochanteric fracture of proximal end of the right femur with nonunion s/p ORIF 02/17/2023.  He noted having globus sensation one day post-op without dysphagia. A barium swallow study 02/23/2023 is abnormal, the barium tablet got stuck in the distal esophagus which may be due to dysmotility, a stricture was not identified and there was evidence of a small hiatal hernia and GERD.  A GI consult was requested for further evaluation and for consideration for an EGD during this hospitalization.  He denies having any acid reflux symptoms prior or during this hospitalization.  No difficulty swallowing solid food, liquids or pills.  He describes feeling a lump sensation in his throat, feels like something is stuck which started 1 day status post right hip ORIF surgery.  He also endorses having frequent hiccups for the past few days.  No nausea or vomiting.  No upper or lower abdominal pain.  Prior to admission, he typically passes a normal brown bowel movement most days.  No bloody stools.  His stools are typically dark since taking ferric citrate.  He takes ASA 81 mg daily at home.  No other NSAIDs.  No recent steroids or antibiotics. Never had an EGD.  Non-smoker.  No alcohol use.  He noted decreased appetite for the past month, no noticeable weight loss.  No fevers or night sweats.  Last dialysis session was Thursday 02/22/2023.  His wife is at the bedside.  Admission Hg 10.3 -> 8.6 -> 7.1 -> 6.4 on 2/17 ->  transfused one unit of PRBCs -> 8.2 -> today Hg 7.7.  No overt GI bleeding.  ECHO 08/30/2022: IMPRESSIONS 1. Technically difficult study. Left ventricular ejection fraction, by estimation, is 60 to 65%.The left ventricle has no regional wall motion abnormalities. There is moderate concentric left ventricular hypertrophy. Left ventricular diastolic parameters are consistent with Grade I diastolic dysfunction (impaired relaxation). The average left ventricular global longitudinal strain is -14.4 % 2. 3. Right ventricular systolic function is normal. The right ventricular size is normal. 4. LA volume index 45.4 mL/m. Left atrial size was severely dilated. 5. Trivial mitral valve regurgitation. Low-flow low gradient aortic stenosis, paradoxical, severe by valve area calculated, visually appears moderately stenotic. Aortic valve area, by VTI measures 0.85 cm. Aortic valve mean gradient measures 19.0 mmHg. Aortic valve Vmax measures 2.92 m/s Stroke-volume index 29.4 mL/m. Mild aortic insufficiency. 6. The inferior vena cava is normal in size with greater than 50% respiratory variability, suggesting right atrial pressure of 3 mmHg. 7. A small to moderate size pericardial effusion is present. The pericardial effusion is circumferential. There is no evidence of cardiac tamponade.  PAST GI PROCEDURES:  Colonoscopy 11/24/2019 at [facility]:   Past Medical History:  Diagnosis Date   BPH (benign prostatic hyperplasia)    Diabetes (HCC)    Dysuria    Erectile dysfunction    ESRD on hemodialysis (HCC)    History of chronic CHF 11/19/2019   Hyperlipemia    Hypertension    Personal history of COVID-19 11/19/2019   Pinna disorder, left  Wears glasses     Past Surgical History:  Procedure Laterality Date   AV FISTULA PLACEMENT Left 04/30/2018   Procedure: CREATION OF RADIOCEPHALIC ARTERIOVENOUS FISTULA LEFT ARM;  Surgeon: Sherren Kerns, MD;  Location: [facility] OR;  Service: Vascular;  Laterality:  Left;   AV FISTULA PLACEMENT Left 08/28/2018   Procedure: ARTERIOVENOUS (AV) BRACHIOCEPHALIC FISTULA CREATION LEFT UPPER ARM;  Surgeon: Cephus Shelling, MD;  Location: [facility] OR;  Service: Vascular;  Laterality: Left;   INTRAMEDULLARY (IM) NAIL INTERTROCHANTERIC Right 02/17/2023   Procedure: INTRAMEDULLARY (IM) NAIL INTERTROCHANTERIC;  Surgeon: Luci Bank, MD;  Location: [facility] OR;  Service: Orthopedics;  Laterality: Right;   IR FLUORO GUIDE CV LINE RIGHT  04/29/2018   IR US GUIDE VASC ACCESS RIGHT  04/29/2018   RIGHT/LEFT HEART CATH AND CORONARY ANGIOGRAPHY N/A 05/01/2018   Procedure: RIGHT/LEFT HEART CATH AND CORONARY ANGIOGRAPHY;  Surgeon: Lyn Records, MD;  Location: [facility] INVASIVE CV LAB;  Service: Cardiovascular;  Laterality: N/A;   RIGHT/LEFT HEART CATH AND CORONARY ANGIOGRAPHY N/A 09/29/2022   Procedure: RIGHT/LEFT HEART CATH AND CORONARY ANGIOGRAPHY;  Surgeon: Tonny Bollman, MD;  Location: [facility] INVASIVE CV LAB;  Service: Cardiovascular;  Laterality: N/A;   subdural hematoma Right 07/2020    Prior to Admission medications   Medication Sig Start Date End Date Taking? Authorizing Provider  amLODipine (NORVASC) 10 MG tablet Take 1 tablet by mouth once daily 02/12/23  Yes Georgeanna Lea, MD  cyanocobalamin (VITAMIN B12) 1000 MCG tablet Take 1,000 mcg by mouth daily.   Yes [provider]  ferric citrate (AURYXIA) 1 GM 210 MG(Fe) tablet Take 630 mg by mouth 3 (three) times daily with meals.   Yes [provider]  isosorbide mononitrate (IMDUR) 30 MG 24 hr tablet Take 1 tablet (30 mg total) by mouth 4 (four) times a week. Mon, Wed, Fri, Sun (non-dialysis days) 10/21/22  Yes Georgeanna Lea, MD  metoCLOPramide (REGLAN) 10 MG tablet Take 1 tablet (10 mg total) by mouth every 6 (six) hours as needed for nausea or vomiting (hiccups). 02/23/23 03/25/23 Yes Carollee Herter, DO  metoprolol succinate (TOPROL-XL) 50 MG 24 hr tablet Take 50 mg by mouth daily.   Yes [provider]  NOVOLIN 70/30 RELION (70-30) 100 UNIT/ML injection Inject 20 Units into the skin daily. 05/02/18  Yes Albertine Grates, MD  ondansetron (ZOFRAN) 4 MG tablet Take 1 tablet (4 mg total) by mouth every 6 (six) hours as needed for nausea or vomiting. 02/23/23 02/23/24 Yes Carollee Herter, DO  rosuvastatin (CRESTOR) 40 MG tablet Take 40 mg by mouth daily. 01/22/22  Yes [provider]  acetaminophen (TYLENOL) 500 MG tablet Take 2 tablets (1,000 mg total) by mouth every 6 (six) hours as needed for mild pain (pain score 1-3), fever or headache. 02/23/23   Carollee Herter, DO  aspirin EC 81 MG tablet Take 1 tablet (81 mg total) by mouth in the morning and at bedtime for 42 days, THEN 1 tablet (81 mg total) daily. Swallow whole.. 02/23/23 07/05/23  Carollee Herter, DO  bisacodyl (DULCOLAX) 5 MG EC tablet Take 1 tablet (5 mg total) by mouth daily as needed for moderate constipation. 02/23/23   Carollee Herter, DO  docusate sodium (COLACE) 100 MG capsule Take 1 capsule (100 mg total) by mouth 2 (two) times daily. 02/23/23   Carollee Herter, DO  hydrALAZINE (APRESOLINE) 25 MG tablet Take 2 tablets (50 mg total) by mouth See admin instructions. Take 50 mg 3 times daily on  M, W, F(dialysis days) Take 50 mg twice daily on Tu, Th, Sat, Sun(non-dialysis days) 02/23/23   Carollee Herter, DO  HYDROmorphone (DILAUDID) 2 MG tablet Take 0.5 tablets (1 mg total) by mouth every 6 (six) hours as needed for severe pain (pain score 7-10) or moderate pain (pain score 4-6). 02/23/23 03/25/23  Carollee Herter, DO  insulin aspart (NOVOLOG) 100 UNIT/ML injection Inject 0-6 Units into the skin 3 (three) times daily with meals. CBG 70 - 120: 0 units CBG 121 - 150: 0 units CBG 151 - 200: 1 unit CBG 201-250: 2 units CBG 251-300: 3 units CBG 301-350: 4 units CBG 351-400: 5 units CBG > 400: Give 6 units and call MD 02/23/23   Carollee Herter, DO  insulin aspart (NOVOLOG) 100 UNIT/ML injection Inject 3 Units into the skin 3 (three) times daily with meals. 02/23/23   Carollee Herter, DO  insulin glargine-yfgn (SEMGLEE) 100 UNIT/ML injection Inject 0.12 mLs (12 Units total) into the skin at bedtime. 02/23/23   Carollee Herter, DO  lactulose (CHRONULAC) 10 GM/15ML solution Take 30 mLs (20 g total) by mouth 2 (two) times daily. 02/23/23   Carollee Herter, DO  methocarbamol (ROBAXIN) 500 MG tablet Take 1 tablet (500 mg total) by mouth every 8 (eight) hours as needed for muscle spasms. 02/23/23 03/25/23  Carollee Herter, DO  Methoxy PEG-Epoetin Beta (MIRCERA IJ)  09/12/22 09/11/23  [provider]  multivitamin (RENA-VIT) TABS tablet Take 1 tablet by mouth at bedtime. 02/23/23   Carollee Herter, DO  nitroGLYCERIN (NITROSTAT) 0.4 MG SL tablet Place 0.4 mg under the tongue every 5 (five) minutes as needed for chest pain. Patient not taking: Reported on 02/16/2023    [provider]  simethicone (MYLICON) 80 MG chewable tablet Chew 2 tablets (160 mg total) by mouth every 6 (six) hours as needed for flatulence. 02/23/23   Carollee Herter, DO    Current Facility-Administered Medications  Medication Dose Route Frequency Provider Last Rate Last Admin   acetaminophen (TYLENOL) tablet 650 mg  650 mg Oral Q6H PRN Charlsie Quest, MD   650 mg at 02/21/23 1218   amLODipine (NORVASC) tablet 10 mg  10 mg Oral Daily Charlsie Quest, MD   10 mg at 02/24/23 1610   bisacodyl (DULCOLAX) EC tablet 5 mg  5 mg Oral Daily PRN Charlsie Quest, MD       bisacodyl (DULCOLAX) suppository 10 mg  10 mg Rectal Once Dahal, Melina Schools, MD       Chlorhexidine Gluconate Cloth 2 % PADS 6 each  6 each Topical Q0600 Berenda Morale, NP   6 each at 02/24/23 9604   chlorproMAZINE (THORAZINE) tablet 25 mg  25 mg Oral TID PRN Dorcas Carrow, MD   25 mg at 02/23/23 1635   Darbepoetin Alfa (ARANESP) injection 100 mcg  100 mcg Subcutaneous Q Sat-1800 Estill Batten W, RPH       docusate sodium (COLACE) capsule 100 mg  100 mg Oral BID Dorcas Carrow, MD   100 mg at 02/24/23 0904   feeding supplement (NEPRO CARB STEADY) liquid 237 mL   237 mL Oral TID BM Dorcas Carrow, MD   237 mL at 02/23/23 2127   ferric citrate (AURYXIA) tablet 630 mg  630 mg Oral TID WC Dorcas Carrow, MD   630 mg at 02/23/23 1635   HYDROmorphone (DILAUDID) tablet 1 mg  1 mg Oral Q6H PRN Carollee Herter, DO       insulin aspart (novoLOG) injection 0-5  Units  0-5 Units Subcutaneous QHS Dorcas Carrow, MD   2 Units at 02/23/23 2125   insulin aspart (novoLOG) injection 0-6 Units  0-6 Units Subcutaneous TID WC Dorcas Carrow, MD   1 Units at 02/24/23 0848   insulin aspart (novoLOG) injection 3 Units  3 Units Subcutaneous TID WC Dorcas Carrow, MD   3 Units at 02/24/23 0849   insulin glargine-yfgn [facility]) injection 12 Units  12 Units Subcutaneous QHS Dorcas Carrow, MD   12 Units at 02/23/23 2125   isosorbide mononitrate (IMDUR) 24 hr tablet 30 mg  30 mg Oral Once per day on Sunday Tuesday Thursday Saturday Charlsie Quest, MD   30 mg at 02/24/23 8413   lactulose (CHRONULAC) 10 GM/15ML solution 20 g  20 g Oral BID Dorcas Carrow, MD   20 g at 02/23/23 2121   LORazepam (ATIVAN) tablet 0.5 mg  0.5 mg Oral Q6H PRN Dorcas Carrow, MD   0.5 mg at 02/20/23 2205   methocarbamol (ROBAXIN) tablet 500 mg  500 mg Oral Q12H Pola Corn, NP   500 mg at 02/23/23 2121   metoCLOPramide (REGLAN) injection 5 mg  5 mg Intravenous QID PRN Carollee Herter, DO   5 mg at 02/23/23 1638   metoprolol succinate (TOPROL-XL) 24 hr tablet 50 mg  50 mg Oral Daily Charlsie Quest, MD   50 mg at 02/24/23 2440   multivitamin (RENA-VIT) tablet 1 tablet  1 tablet Oral QHS Dorcas Carrow, MD   1 tablet at 02/23/23 2121   ondansetron (ZOFRAN) injection 4 mg  4 mg Intravenous Q6H PRN Charlsie Quest, MD       rosuvastatin (CRESTOR) tablet 40 mg  40 mg Oral Daily Charlsie Quest, MD   40 mg at 02/24/23 1027   senna-docusate (Senokot-S) tablet 1 tablet  1 tablet Oral QHS PRN Charlsie Quest, MD       simethicone (MYLICON) chewable tablet 160 mg  160 mg Oral Q6H PRN Carollee Herter, DO   160 mg at 02/23/23  1635    Allergies as of 02/16/2023   (No Known Allergies)    Family History  Problem Relation Age of Onset   Stroke Mother    Diabetes Father     Social History   Socioeconomic History   Marital status: Divorced    Spouse name: Not on file   Number of children: 1   Years of education: Not on file   Highest education level: Not on file  Occupational History   Not on file  Tobacco Use   Smoking status: Never   Smokeless tobacco: Never  Vaping Use   Vaping status: Never Used  Substance and Sexual Activity   Alcohol use: Not Currently   Drug use: Never   Sexual activity: Not Currently  Other Topics Concern   Not on file  Social History Narrative   Not on file   Social Drivers of Health   Financial Resource Strain: Low Risk  (07/13/2020)   Received from [facility], [facility]   Overall Financial Resource Strain (CARDIA)    Difficulty of Paying Living Expenses: Not very hard  Food Insecurity: No Food Insecurity (02/17/2023)   Hunger Vital Sign    Worried About Running Out of Food in the Last Year: Never true    Ran Out of Food in the Last Year: Never true  Transportation Needs: No Transportation Needs (02/17/2023)   PRAPARE - Administrator, Civil Service (Medical):  No    Lack of Transportation (Non-Medical): No  Physical Activity: Not on file  Stress: Not on file  Social Connections: Socially Integrated (02/17/2023)   Social Connection and Isolation Panel [NHANES]    Frequency of Communication with Friends and Family: More than three times a week    Frequency of Social Gatherings with Friends and Family: More than three times a week    Attends Religious Services: More than 4 times per year    Active Member of [employer] or Organizations: Yes    Attends Banker Meetings: 1 to 4 times per year    Marital Status: Living with partner  Intimate Partner Violence: Not At Risk (02/17/2023)   Humiliation, Afraid, Rape, and Kick questionnaire     Fear of Current or Ex-Partner: No    Emotionally Abused: No    Physically Abused: No    Sexually Abused: No   Review of Systems: Gen: Denies fever, sweats or chills. No weight loss.  CV: Denies chest pain, palpitations or edema. Resp: Denies cough, shortness of breath of hemoptysis.  GI:See HPI. GU : Denies urinary burning, blood in urine, increased urinary frequency or incontinence. MS:+ Right hip fracture/pain. Derm: RLE bruising. Psych: Denies depression, anxiety, memory loss or confusion. Heme: Denies easy bruising, bleeding. Neuro:  Denies headaches, dizziness or paresthesias. Endo:  Denies any problems with DM, thyroid or adrenal function.  Physical Exam: Vital signs in last 24 hours: Temp:  [98.3 F (36.8 C)-98.7 F (37.1 C)] 98.7 F (37.1 C) (02/22 0842) Pulse Rate:  [69-82] 76 (02/22 0842) Resp:  [17-20] 17 (02/22 0842) BP: (120-139)/(52-60) 139/60 (02/22 0842) SpO2:  [98 %-100 %] 99 % (02/22 0842) Last BM Date :  (PTA) General:  Alert 73 year old male fatigued.  In no acute distress. Head:  Normocephalic and atraumatic. Eyes:  No scleral icterus. Conjunctiva pink. Ears:  Normal auditory acuity. Nose:  No deformity, discharge or lesions. Mouth:  Dentition intact. No ulcers or lesions.  Neck:  Supple. No lymphadenopathy or thyromegaly.  Lungs: Breath sounds clear throughout. No wheezes, rhonchi or crackles.  Heart: Regular rate and rhythm, no murmurs. Abdomen: Soft, nondistended.  Nontender.  Positive bowel sounds all 4 quadrants. Rectal: Deferred. Musculoskeletal: Right hip dressing dry and intact. Pulses:  Normal pulses noted. Extremities: No edema, moderate ecchymosis to the distal left lower extremity. Neurologic:  Alert and  oriented x 4. No focal deficits.  Skin:  Intact without significant lesions or rashes. Psych:  Alert and cooperative. Normal mood and affect.  Intake/Output from previous day: 02/21 0701 - 02/22 0700 In: 820 [P.O.:820] Out: -   Intake/Output this shift: No intake/output data recorded.  Lab Results: Recent Labs    02/24/23 0449  WBC 8.2  HGB 7.7*  HCT 22.0*  PLT 271   BMET Recent Labs    02/24/23 0449  NA 134*  K 4.3  CL 92*  CO2 23  GLUCOSE 196*  BUN 83*  CREATININE 9.63*  CALCIUM 8.4*   LFT Recent Labs    02/24/23 0449  ALBUMIN 2.6*   PT/INR No results for input(s): "LABPROT", "INR" in the last 72 hours. Hepatitis Panel No results for input(s): "HEPBSAG", "HCVAB", "HEPAIGM", "HEPBIGM" in the last 72 hours.    Studies/Results: DG ESOPHAGUS W SINGLE CM (SOL OR THIN BA) Result Date: 02/23/2023 CLINICAL DATA:  73 year old male with chronic globus sensation. Request for esophagram. EXAM: ESOPHAGUS/BARIUM SWALLOW/TABLET STUDY TECHNIQUE: Single contrast examination was performed using thin liquid barium. This exam was performed  by Lawernce Ion, PA-C, and was supervised and interpreted by Richarda Overlie,. The exam was extremely limited due to patient's limited mobility and right hip. All images were taken in supine LPO position. FLUOROSCOPY: Radiation Exposure Index (as provided by the fluoroscopic device): 14.9 mGy Kerma COMPARISON:  None Available. FINDINGS: Swallowing: No aspiration or penetration seen during this limited exam. Pharynx: Unremarkable. Esophagus: Fluoroscopic evaluation demonstrates normal caliber and smooth contour of the esophagus. No evidence of fixed stricture, mass or mucosal abnormality. Esophageal motility: Decreased.  Few tertiary contractions. Hiatal Hernia: Probable small hiatal hernia Gastroesophageal reflux: Episode of spontaneous reflux noted into the upper esophagus. Ingested 13mm barium tablet: The 13 mm barium tablet stuck in the distal esophagus above the GE junction. The tablet did not move distally with additional swallows of thin. No focal mucosal abnormality or stricture identified. Other: None. IMPRESSION: 1. 13 mm barium tablet got stuck in the distal esophagus of  uncertain etiology. There is not a gross mucosal abnormality or stricture in this area. Findings may be associated with dysmotility but consider further evaluation with endoscopy. 2. Probable small hiatal hernia. 3. Evidence for spontaneous gastroesophageal reflux. Electronically Signed   By: Richarda Overlie M.D.   On: 02/23/2023 16:43   DG CHEST PORT 1 VIEW Result Date: 02/22/2023 CLINICAL DATA:  Shortness of breath.  Hypertension. EXAM: PORTABLE CHEST 1 VIEW COMPARISON:  Chest radiograph dated 02/16/2023. FINDINGS: No focal consolidation, pleural effusion, or pneumothorax. Mild cardiomegaly. Atherosclerotic calcification of the aorta. No acute osseous pathology. IMPRESSION: 1. No active disease. 2. Mild cardiomegaly. Electronically Signed   By: Elgie Collard M.D.   On: 02/22/2023 14:52    IMPRESSION/PLAN:  73 year old male admitted to the hospital 02/16/2023 with a right hip injury, closed comminuted intertrochanteric fracture of proximal end of the right femur with nonunion s/p  ORIF 02/17/2023. -Discharge to SNF when bed available  New onset globus sensation. Barium swallow study 2/21 showed the barium tablet got stuck in the distal esophagus which may be due to dysmotility, a stricture was not identified and there was evidence of a small hiatal hernia and GERD.  -NPO -EGD benefits and risks discussed including risk with sedation, risk of bleeding, perforation and infection  -Further recommendations to be determined after EGD completed  Acute on chronic anemia in setting of s/p ORIF and ESRD. Admission Hg 10.3 -> 8.6 -> 7.1 -> 6.4 on 2/17 -> transfused one unit of PRBCs -> 8.2 -> today Hg 7.7.  Iron 57.  TIBC 171.  Ferritin 5,822. -Recommend PRBC transfusion to maintain hemoglobin > 8 secondary to history of CAD  History of CAD and AS. No angina.  ESRD on HD, last dialysis session was on Thursday 2/20.  Due for dialysis today. Cr 9.63. Na= 134. K+ 4.3.  DM type II    Arnaldo Natal  02/24/2023, 110:07 AM

## 2023-02-24 NOTE — Plan of Care (Signed)
  Problem: Nutrition: Goal: Adequate nutrition will be maintained Outcome: Progressing   Problem: Safety: Goal: Ability to remain free from injury will improve Outcome: Progressing   

## 2023-02-24 NOTE — Progress Notes (Signed)
 Patient returned from EGD procedure and stated that he doesn't feel very well. He requested to have dialysis cancelled for the day. RN consulted Dr.Schertz who confirmed that it is ok for the patient to skip today's treatment.

## 2023-02-24 NOTE — Progress Notes (Signed)
 PROGRESS NOTE    Justin [name]  ZOX:[id number] DOB: 11-06-1950 DOA: 02/16/2023 PCP: Adrian Prince, MD  Subjective: Pt seen and examined. No SNF beds open until Monday or Tuesday. Esophagram performed yesterday due to globus sensation showed barium tablet got stuck at GE junction.  GI consulted for this AM. Pt has been NPO since midnight.  Discussed with pt and HCPOA Kathy at bedside   Hospital Course: HPI: Justin [name] is a 73 y.o. male with medical history significant for ESRD on TTS HD, CAD, moderate aortic stenosis, T2DM, HTN, HLD who presented to the ED for evaluation of right hip pain after a fall.   Patient states that he was changing a bucket on a bobcat inside of a concrete building when he lost his balance and fell backwards landing on the ground.  He had immediate pain to his right hip.  He denies hitting his head or losing consciousness.  EMS were called.  He was noted to have gross deformity of the right hip.  He was brought to the ED for further evaluation.   Patient takes aspirin 81 mg daily, no other blood thinners.  He denies any recent chest pain, dyspnea.  He reports attending usual HD, last session was yesterday 2/13.  His primary nephrologist is Dr. Glenna Fellows.   ED Course  Labs/Imaging on admission: I have personally reviewed following labs and imaging studies.   Initial vitals showed BP 110/50, pulse 73, RR 15, temp 98.2 F, SpO2 100% on room air.   Labs show serum glucose 535, sodium 130 (140 when corrected for hyperglycemia), potassium 3.8, bicarb 24, BUN 30, creatinine 6.7, WBC 8.3, hemoglobin 10.3, platelets 190,000.   Pelvic/right femur x-ray showed a comminuted displaced intertrochanteric right proximal femur fracture.   CT head without contrast negative for acute intracranial normality.   CT cervical spine without contrast negative for acute displaced fracture or traumatic listhesis of the C-spine.   Patient was given 10 units subcutaneous NovoLog, IV  Dilaudid and fentanyl.  Orthopedics (Dr. Hulda Humphrey) were consulted and recommended medical admission.  The hospitalist service was consulted to admit.   Significant Events: Admitted 02/16/2023 for right intertrochanteric femur fracture 02-17-2023 Right IM nail for intertrochanteric fracture 02/19/2023, 1 unit PRBC transfusion for hemoglobin 6.4. 02/20/2023, overnight with some delirium after hemodialysis.  Clinically improving.  Needs a skilled nursing facility with dialysis transport.  Significant Labs: WBC 8.3, Hg 10.3, Plt 190 Na 133, K 3.3, Cl 94, BUN 34, Scr 7.83  Significant Imaging Studies: Right femur XR shows Comminuted displaced intertrochanteric right proximal femur fracture. CT head/c-spine shows No acute intracranial abnormality. 2. No acute displaced fracture or traumatic listhesis of the cervical spine. CXR shows Chronic cardiomegaly. No acute chest findings.   Antibiotic Therapy: Anti-infectives (From admission, onward)    Start     Dose/Rate Route Frequency Ordered Stop   02/17/23 0731  ceFAZolin (ANCEF) 2-4 GM/100ML-% IVPB       Note to Pharmacy: Eliott Nine K: cabinet override      02/17/23 0731 02/17/23 1944      Procedures: 02-17-2023 IM nail right femur  Consultants: Nephrology Orthopedics    Assessment and Plan: * Closed comminuted intertrochanteric fracture of proximal end of femur with nonunion, right 02-16-2023 through 02-22-2023 Status post ORIF, IM nail right femur. Dr Hulda Humphrey 2/15.  Weightbearing as tolerated.  Adequate pain control with oral opiates.  Prefers tramadol. Continue to work with PT OT. DVT prophylaxis, Subcu heparin while in the hospital.  Aspirin 81  mg twice daily after starting mobilizing.  02-23-2023 s/p ORIF on 02-17-2023. Preferred opiate therapy in ESRD patients is either fentanyl or dilaudid. No use for IV dilaudid at this time. Pt is POD#6. Change to po dilaudid 1 mg q4h prn pain. Medically stable for DC to SNF  02-24-2023 discussed with  ortho surgery yesterday. Keep dressing on for the next 2 days. And Then can removed and keep it open to air. When he goes to SNF next week. Wound should be kept open to air.  Globus sensation 02-23-2023 pt's POA [name] mentioned pt having globus sensation. Will check esophagram to make sure there isn't a foreign body in his esophagus or a retained pill fragment.  02-24-2023 GI consult. Has been NPO since MN. Potential for EGD today +/- esophageal dilatation.  S/P ORIF (open reduction internal fixation) fracture - right intertrochanteric fracture 02-23-2023 DVT prophylaxis SQ heparin while in the hospital. @ discharge, pt will be changed to ASA 81 mg bid per ortho. Pt is weight bearing as tolerated.  02-24-2023 discussed with ortho surgery yesterday. Keep dressing on for the next 2 days. And Then can removed and keep it open to air. When he goes to SNF next week. Wound should be kept open to air.   Hiccups 02-24-2023 on IV reglan. May be due to GE stricture or what ever is causing barium tablet obstruction. GI to evaluate this today with EGD.  Delirium due to multiple etiologies Pt had hospital-acquired delirium. Multiple etiologies.  Pt mentation finally cleared and he was AxOx3 by the time he was discharged.  Acute postoperative anemia due to expected blood loss 02-19-2023 received 1 unit PRBC for HGB of 6.4 g/dl. Had surgery on 02-17-2023 02-23-2023 post-transfusion Hgb 7.4 on 02-20-2023.  02-24-2023 plan on giving him 1 unit of PRBC during HD on Monday, Feb 26, 2023.  ESRD on hemodialysis (HCC) - out-pt HD at [facility] on TTS 5:15 am chair time 02-23-2023 pt seen by nephrology and has had routine HD via left UE AVF/AVG.  CM looking for outpatient HD bed near [facility] in [place].  Pt has insurance authorization and SNF bed offer.  02-24-2023 Hemodialysis coordinator has secured spot for outpatient HD for patient. Will be M, W, F @ [facility] on MWF 11:30 am chair time. Note,  this is different from his home HD scheduled which is at [facility] on TTS 5:15 am chair time. Some of his outpatient medications such as HTN will need to be adjusted to accommodate change in HD session days-of-the-week.  Type 2 diabetes mellitus with hyperglycemia (HCC) 02-23-2023 on lantus 12 u at bedtime, mealtime novolog 3 units plus SSI. 02-24-2023 stable.  Anemia in chronic kidney disease 02-23-2023 stable. On Aranesp qweekly.  Essential hypertension 02-23-2023. Continue with Toprol-xl 50 mg qday, imdur 30 mg qSun, Tues, Thurs, Sat, norvasc 10 mg daily.  02-24-2023. Stable. Should be on Imdur 30 mg daily on Non-HD days. Since he will be getting HD at [facility] on M, W, F. He should get the Imdur on T, Th, Sat, Sun  Coronary artery disease involving native coronary artery of native heart without angina pectoris 02-23-2023 stable. On crestor 40 mg every day, Toprol-XL 50 mg qday, imdur 30 mg q Sun, Tues, Thurs, Sat.  02-24-2023 stable. Last [facility] 09-29-2022. Plan on PRBC transfusion on Monday, Feb 26, 2023 with/before HD.  LHC Showed:  1.  Normal right heart hemodynamics with preserved cardiac output of 8.96 L/min and cardiac index of 4.5 L/min/m (likely  high flow with hemodialysis/AV fistula) 2.  Moderate aortic stenosis with mean gradient 20 mmHg, calculated valve area 2.2 cm likely overestimated with high flow state.  Valve crossed with a J-wire. 3.  Patent left main with no stenosis 4.  Patent LAD with moderate nonobstructive stenosis stable from the previous study, moderately severe first diagonal stenosis of 70% 5.  Severe proximal left circumflex stenosis, supplies very small territory myocardium 6.  Patent, large dominant RCA with mild nonobstructive plaquing and severe stenosis of the PDA branch stable from the previous study   Recommendations: Favor continued Risk analyst.  The patient has moderate aortic stenosis at most.  His coronary artery disease should be managed  medically and in absence of angina.  He has branch vessel disease with significant stenoses in a diagonal branch, AV circumflex that supplies a very small vascular territory, and right PDA.  Nonrheumatic aortic valve stenosis 02-23-2023 moderate per cardiology noted 12-06-2022.  Managed by Dr. Bing Matter. 02-24-2023 stable. Last LHC/RHC 09-29-2022.  "Moderate aortic stenosis with mean gradient 20 mmHg, calculated valve area 2.2 cm likely overestimated with high flow state."   Hyperlipidemia 02-23-2023 stable. On crestor 40 mg daily. 02-24-2023 continue crestor 40 mg daily.   DVT prophylaxis: SCDs Start: 02/16/23 2147    Code Status: Full Code Family Communication: discussed with pt and HCPOA Kathy at bedside Disposition Plan: SNF next week Reason for continuing need for hospitalization: GI consult to see pt today. PRBC transfusion early Monday AM before HD.  Objective: Vitals:   02/23/23 1603 02/23/23 2003 02/24/23 0401 02/24/23 0842  BP: (!) 120/54 (!) 125/53 (!) 133/52 139/60  Pulse: 82 74 69 76  Resp: 17 20 18 17   Temp: 98.6 F (37 C) 98.4 F (36.9 C) 98.3 F (36.8 C) 98.7 F (37.1 C)  TempSrc: Oral Oral Oral Oral  SpO2: 98% 100% 100% 99%  Weight:      Height:        Intake/Output Summary (Last 24 hours) at 02/24/2023 0932 Last data filed at 02/23/2023 2200 Gross per 24 hour  Intake 580 ml  Output --  Net 580 ml   Filed Weights   02/19/23 1340 02/19/23 1733 02/22/23 0817  Weight: 84.1 kg 82.6 kg 84 kg    Examination:  Physical Exam Vitals and nursing note reviewed.  Constitutional:      Appearance: He is not toxic-appearing or diaphoretic.  HENT:     Head: Normocephalic and atraumatic.  Eyes:     General: No scleral icterus. Cardiovascular:     Rate and Rhythm: Normal rate and regular rhythm.     Pulses: Normal pulses.     Heart sounds: Murmur heard.  Pulmonary:     Effort: Pulmonary effort is normal.     Breath sounds: Normal breath sounds.  Abdominal:      General: Bowel sounds are normal. There is no distension.     Palpations: Abdomen is soft.     Tenderness: There is no abdominal tenderness.     Comments: +hiccups  Musculoskeletal:     Comments: Left UE AVF/AVG  Skin:    General: Skin is warm and dry.     Capillary Refill: Capillary refill takes less than 2 seconds.  Neurological:     Mental Status: He is alert and oriented to person, place, and time.    Data Reviewed: I have personally reviewed following labs and imaging studies  CBC: Recent Labs  Lab 02/18/23 0903 02/19/23 0533 02/20/23 0847 02/24/23 0449  WBC  10.9* 9.4 9.8 8.2  NEUTROABS 8.9* 6.8 7.4 5.7  HGB 7.1* 6.4* 8.2* 7.7*  HCT 19.5* 18.0* 22.8* 22.0*  MCV 85.9 89.1 87.4 87.6  PLT 152 148* 150 271   Basic Metabolic Panel: Recent Labs  Lab 02/19/23 0533 02/24/23 0449  NA 132* 134*  K 4.4 4.3  CL 89* 92*  CO2 24 23  GLUCOSE 371* 196*  BUN 79* 83*  CREATININE 11.25* 9.63*  CALCIUM 8.2* 8.4*  PHOS 7.8* 7.0*   GFR: Estimated Creatinine Clearance: 6.9 mL/min (A) (by C-G formula based on SCr of 9.63 mg/dL (H)). Liver Function Tests: Recent Labs  Lab 02/19/23 0533 02/24/23 0449  ALBUMIN 2.8* 2.6*    BNP (last 3 results) Recent Labs    06/05/22 1010  BNP 1,835.7*    CBG: Recent Labs  Lab 02/23/23 0748 02/23/23 1119 02/23/23 1607 02/23/23 2059 02/24/23 0847  GLUCAP 167* 275* 275* 224* 166*   Anemia Panel: Recent Labs    02/24/23 0449  FERRITIN 5,822*  TIBC 171*  IRON 57   Recent Results (from the past 240 hours)  Surgical PCR screen     Status: None   Collection Time: 02/17/23  2:45 AM   Specimen: Nasal Mucosa; Nasal Swab  Result Value Ref Range Status   MRSA, PCR NEGATIVE NEGATIVE Final   Staphylococcus aureus NEGATIVE NEGATIVE Final    Comment: (NOTE) The Xpert SA Assay (FDA approved for NASAL specimens in patients 72 years of age and older), is one component of a comprehensive surveillance program. It is not intended to  diagnose infection nor to guide or monitor treatment. Performed at [facility], [address] [place], [place] [address]      Radiology Studies: DG ESOPHAGUS W SINGLE CM (SOL OR THIN BA) Result Date: 02/23/2023 CLINICAL DATA:  73 year old male with chronic globus sensation. Request for esophagram. EXAM: ESOPHAGUS/BARIUM SWALLOW/TABLET STUDY TECHNIQUE: Single contrast examination was performed using thin liquid barium. This exam was performed by Lawernce Ion, PA-C, and was supervised and interpreted by Richarda Overlie,. The exam was extremely limited due to patient's limited mobility and right hip. All images were taken in supine LPO position. FLUOROSCOPY: Radiation Exposure Index (as provided by the fluoroscopic device): 14.9 mGy Kerma COMPARISON:  None Available. FINDINGS: Swallowing: No aspiration or penetration seen during this limited exam. Pharynx: Unremarkable. Esophagus: Fluoroscopic evaluation demonstrates normal caliber and smooth contour of the esophagus. No evidence of fixed stricture, mass or mucosal abnormality. Esophageal motility: Decreased.  Few tertiary contractions. Hiatal Hernia: Probable small hiatal hernia Gastroesophageal reflux: Episode of spontaneous reflux noted into the upper esophagus. Ingested 13mm barium tablet: The 13 mm barium tablet stuck in the distal esophagus above the GE junction. The tablet did not move distally with additional swallows of thin. No focal mucosal abnormality or stricture identified. Other: None. IMPRESSION: 1. 13 mm barium tablet got stuck in the distal esophagus of uncertain etiology. There is not a gross mucosal abnormality or stricture in this area. Findings may be associated with dysmotility but consider further evaluation with endoscopy. 2. Probable small hiatal hernia. 3. Evidence for spontaneous gastroesophageal reflux. Electronically Signed   By: Richarda Overlie M.D.   On: 02/23/2023 16:43   DG CHEST PORT 1 VIEW Result Date: 02/22/2023 CLINICAL  DATA:  Shortness of breath.  Hypertension. EXAM: PORTABLE CHEST 1 VIEW COMPARISON:  Chest radiograph dated 02/16/2023. FINDINGS: No focal consolidation, pleural effusion, or pneumothorax. Mild cardiomegaly. Atherosclerotic calcification of the aorta. No acute osseous pathology. IMPRESSION: 1.  No active disease. 2. Mild cardiomegaly. Electronically Signed   By: Elgie Collard M.D.   On: 02/22/2023 14:52    Scheduled Meds:  amLODipine  10 mg Oral Daily   bisacodyl  10 mg Rectal Once   Chlorhexidine Gluconate Cloth  6 each Topical Q0600   darbepoetin (ARANESP) injection - DIALYSIS  100 mcg Subcutaneous Q Sat-1800   docusate sodium  100 mg Oral BID   feeding supplement (NEPRO CARB STEADY)  237 mL Oral TID BM   ferric citrate  630 mg Oral TID WC   insulin aspart  0-5 Units Subcutaneous QHS   insulin aspart  0-6 Units Subcutaneous TID WC   insulin aspart  3 Units Subcutaneous TID WC   insulin glargine-yfgn  12 Units Subcutaneous QHS   isosorbide mononitrate  30 mg Oral Once per day on Sunday Tuesday Thursday Saturday   lactulose  20 g Oral BID   methocarbamol  500 mg Oral Q12H   metoprolol succinate  50 mg Oral Daily   multivitamin  1 tablet Oral QHS   rosuvastatin  40 mg Oral Daily   Continuous Infusions:   LOS: 8 days   Time spent: 45 minutes  Carollee Herter, DO  [facility]  02/24/2023, 9:32 AM

## 2023-02-25 DIAGNOSIS — E1165 Type 2 diabetes mellitus with hyperglycemia: Secondary | ICD-10-CM

## 2023-02-25 DIAGNOSIS — Z794 Long term (current) use of insulin: Secondary | ICD-10-CM

## 2023-02-25 DIAGNOSIS — Z9889 Other specified postprocedural states: Secondary | ICD-10-CM

## 2023-02-25 DIAGNOSIS — K209 Esophagitis, unspecified without bleeding: Secondary | ICD-10-CM

## 2023-02-25 DIAGNOSIS — G4701 Insomnia due to medical condition: Secondary | ICD-10-CM

## 2023-02-25 DIAGNOSIS — S72141K Displaced intertrochanteric fracture of right femur, subsequent encounter for closed fracture with nonunion: Secondary | ICD-10-CM | POA: Diagnosis not present

## 2023-02-25 DIAGNOSIS — D62 Acute posthemorrhagic anemia: Secondary | ICD-10-CM | POA: Diagnosis not present

## 2023-02-25 DIAGNOSIS — Z8781 Personal history of (healed) traumatic fracture: Secondary | ICD-10-CM

## 2023-02-25 HISTORY — DX: Insomnia due to medical condition: G47.01

## 2023-02-25 HISTORY — DX: Esophagitis, unspecified without bleeding: K20.90

## 2023-02-25 LAB — GLUCOSE, CAPILLARY
Glucose-Capillary: 136 mg/dL — ABNORMAL HIGH (ref 70–99)
Glucose-Capillary: 237 mg/dL — ABNORMAL HIGH (ref 70–99)
Glucose-Capillary: 224 mg/dL — ABNORMAL HIGH (ref 70–99)
Glucose-Capillary: 238 mg/dL — ABNORMAL HIGH (ref 70–99)

## 2023-02-25 LAB — PREPARE RBC (CROSSMATCH)

## 2023-02-25 MED ORDER — MELATONIN 5 MG PO TABS
10.0000 mg | ORAL_TABLET | Freq: Every day | ORAL | Status: DC
  Administered 2023-02-25: 10 mg via ORAL
  Filled 2023-02-25: qty 2

## 2023-02-25 MED ORDER — CHLORHEXIDINE GLUCONATE CLOTH 2 % EX PADS
6.0000 | MEDICATED_PAD | Freq: Every day | CUTANEOUS | Status: DC
  Administered 2023-02-26: 6 via TOPICAL

## 2023-02-25 MED ORDER — TRAZODONE HCL 50 MG PO TABS: 100.0000 mg | ORAL_TABLET | Freq: Every evening | ORAL | Status: DC | PRN

## 2023-02-25 MED ORDER — SODIUM CHLORIDE 0.9% IV SOLUTION: Freq: Once | INTRAVENOUS | Status: AC

## 2023-02-25 NOTE — Plan of Care (Signed)
  Problem: Coping: Goal: Ability to adjust to condition or change in health will improve Outcome: Progressing   Problem: Education: Goal: Knowledge of General Education information will improve Description: Including pain rating scale, medication(s)/side effects and non-pharmacologic comfort measures Outcome: Progressing   

## 2023-02-25 NOTE — Assessment & Plan Note (Signed)
 02-25-2020 seen on EGD yesterday. GI recommended for pt to stay on protonix 40 mg bid x 12 weeks then qday afterwards. Will need repeat EGD in 2-3 months. Will send ambulatory referral to [facility] for repeat endo in 3 months.

## 2023-02-25 NOTE — Assessment & Plan Note (Signed)
 02-25-2023 pt c/o of insomnia. Has never taken any sleeping pills before. Will order 10 mg melatonin at bedtime and some prn trazodone if melatonin doesn't work.

## 2023-02-25 NOTE — Progress Notes (Addendum)
 [facility] Progress Note   Subjective:    Seen and examined patient at bedside. S/p EGD yesterday which showed esophagitis. Now on PPI BID. Informed he refused HD yesterday b/c he was tired after the procedure. Plan for HD tomorrow. Discussed with Hospitalist: patient scheduled to receive 2 units PRBCs tonight. Appears his Hgb threshold is to transfuse if < 8 given recent ortho procedure.  Objective Vitals:   02/24/23 2031 02/25/23 0605 02/25/23 0722 02/25/23 1346  BP: (!) 155/65 (!) 169/65 (!) 146/61 128/65  Pulse: 73 76 79 73  Resp: 17 17 17 17   Temp: 98.4 F (36.9 C) 98.4 F (36.9 C) 98.4 F (36.9 C) 98.7 F (37.1 C)  TempSrc: Oral Oral Oral Oral  SpO2: 99% 99% 98% 99%  Weight:      Height:       Physical Exam General: Pleasant older male in NAD Heart: S1,S2 3/6 systolic M. No R/G Lungs: CTAB A/P Abdomen: NABS Extremities:Drsg R hip intact. Trace edema RU Thigh. No LLE edema.  Dialysis Access: L AVF + T/B  Filed Weights   02/19/23 1340 02/19/23 1733 02/22/23 0817  Weight: 84.1 kg 82.6 kg 84 kg    Intake/Output Summary (Last 24 hours) at 02/25/2023 1432 Last data filed at 02/25/2023 0254 Gross per 24 hour  Intake 100 ml  Output 75 ml  Net 25 ml    Additional Objective Labs: Basic Metabolic Panel: Recent Labs  Lab 02/19/23 0533 02/24/23 0449  NA 132* 134*  K 4.4 4.3  CL 89* 92*  CO2 24 23  GLUCOSE 371* 196*  BUN 79* 83*  CREATININE 11.25* 9.63*  CALCIUM 8.2* 8.4*  PHOS 7.8* 7.0*   Liver Function Tests: Recent Labs  Lab 02/19/23 0533 02/24/23 0449  ALBUMIN 2.8* 2.6*   No results for input(s): "LIPASE", "AMYLASE" in the last 168 hours. CBC: Recent Labs  Lab 02/19/23 0533 02/20/23 0847 02/24/23 0449  WBC 9.4 9.8 8.2  NEUTROABS 6.8 7.4 5.7  HGB 6.4* 8.2* 7.7*  HCT 18.0* 22.8* 22.0*  MCV 89.1 87.4 87.6  PLT 148* 150 271   Blood Culture    Component Value Date/Time   SDES BLOOD RIGHT ARM 06/05/2022 1759   SPECREQUEST   06/05/2022 1759    BOTTLES DRAWN AEROBIC AND ANAEROBIC Blood Culture results may not be optimal due to an excessive volume of blood received in culture bottles   CULT  06/05/2022 1759    NO GROWTH 5 DAYS Performed at [facility], [address] [place], [place] [address]    REPTSTATUS 06/10/2022 FINAL 06/05/2022 1759    Cardiac Enzymes: No results for input(s): "CKTOTAL", "CKMB", "CKMBINDEX", "TROPONINI" in the last 168 hours. CBG: Recent Labs  Lab 02/24/23 1501 02/24/23 1652 02/24/23 2029 02/25/23 0626 02/25/23 1103  GLUCAP 158* 207* 202* 136* 237*   Iron Studies:  Recent Labs    02/24/23 0449  IRON 57  TIBC 171*  FERRITIN 5,822*   Lab Results  Component Value Date   INR 1.1 02/16/2023   INR 1.3 (H) 04/29/2018   Studies/Results: DG ESOPHAGUS W SINGLE CM (SOL OR THIN BA) Result Date: 02/23/2023 CLINICAL DATA:  73 year old male with chronic globus sensation. Request for esophagram. EXAM: ESOPHAGUS/BARIUM SWALLOW/TABLET STUDY TECHNIQUE: Single contrast examination was performed using thin liquid barium. This exam was performed by Lawernce Ion, PA-C, and was supervised and interpreted by Richarda Overlie,. The exam was extremely limited due to patient's limited mobility and right hip. All images were taken in  supine LPO position. FLUOROSCOPY: Radiation Exposure Index (as provided by the fluoroscopic device): 14.9 mGy Kerma COMPARISON:  None Available. FINDINGS: Swallowing: No aspiration or penetration seen during this limited exam. Pharynx: Unremarkable. Esophagus: Fluoroscopic evaluation demonstrates normal caliber and smooth contour of the esophagus. No evidence of fixed stricture, mass or mucosal abnormality. Esophageal motility: Decreased.  Few tertiary contractions. Hiatal Hernia: Probable small hiatal hernia Gastroesophageal reflux: Episode of spontaneous reflux noted into the upper esophagus. Ingested 13mm barium tablet: The 13 mm barium tablet stuck in the distal esophagus  above the GE junction. The tablet did not move distally with additional swallows of thin. No focal mucosal abnormality or stricture identified. Other: None. IMPRESSION: 1. 13 mm barium tablet got stuck in the distal esophagus of uncertain etiology. There is not a gross mucosal abnormality or stricture in this area. Findings may be associated with dysmotility but consider further evaluation with endoscopy. 2. Probable small hiatal hernia. 3. Evidence for spontaneous gastroesophageal reflux. Electronically Signed   By: Richarda Overlie M.D.   On: 02/23/2023 16:43    Medications:   sodium chloride   Intravenous Once   amLODipine  10 mg Oral Daily   Chlorhexidine Gluconate Cloth  6 each Topical Q0600   darbepoetin (ARANESP) injection - DIALYSIS  100 mcg Subcutaneous Q Sat-1800   docusate sodium  100 mg Oral BID   feeding supplement (NEPRO CARB STEADY)  237 mL Oral TID BM   ferric citrate  630 mg Oral TID WC   insulin aspart  0-5 Units Subcutaneous QHS   insulin aspart  0-6 Units Subcutaneous TID WC   insulin aspart  3 Units Subcutaneous TID WC   insulin glargine-yfgn  12 Units Subcutaneous QHS   isosorbide mononitrate  30 mg Oral Once per day on Sunday Tuesday Thursday Saturday   lactulose  20 g Oral BID   melatonin  10 mg Oral QHS   methocarbamol  500 mg Oral Q12H   metoprolol succinate  50 mg Oral Daily   multivitamin  1 tablet Oral QHS   pantoprazole  40 mg Oral BID   rosuvastatin  40 mg Oral Daily    Dialysis Orders: TTS [place] 4h  B450  81.1kg   2K bath  L AVF  Heparin none  - last HD 2/13, post wt 81.6kg, gets to dry wt, wt gain ~ 3kg - hectorol 2 mcg IV  - no esa - last Hb 10.5, ferritin 1605, pth 222    Renal-related home meds: - ferric citrate 3 ac tid - hydralazine 50 tid  - norvasc 10 - metoprolol xl 50 every day - others: imdur, sl ntg prn, statin, novolin insulin, asa  Assessment/Plan: R hip fracture - after a fall. S/P medullary nail procedure done today 2/15 by  orthopedic surgery. PT now working with patient. Globus sensation - had an abnormal barium swallow 2/21. GI consulted and s/p EGD 2/22 which showed esophagitis, now on PPI BID.  ESRD - original HD schedule was TTS. Patient now will be transferred to [facility] MWF while he is receiving rehab. Next HD tomorrow (2/24) AM and likely will be discharged afterwards.  HTN - BP's currently acceptable. Continue current medications Volume - Euvolemic on exam. UF as tolerated. Anemia of esrd -  Last Hgb 7.7. Iron studies from 2/22: Iron 57, Tsat 33%, and Ferritin 5822. ESA given 2/22. Titrate up dose if needed. He is scheduled to receive 2 unit PRBCs tonight. Hgb threshold is < 8 given recent ortho procedure.  CBC already ordered to be re-check at 12p on 2/24 per Primary. Secondary hyperparathyroidism - Ca and phos in range but alb is low, on Nepro. Cont binders w/ meals and IV vdra.  Dispo - Plan for SNF placement for rehab. He will get HD at [facility] MWF.   Salome Holmes, NP [facility] 02/25/2023,2:32 PM  LOS: 9 days

## 2023-02-25 NOTE — Progress Notes (Signed)
 PROGRESS NOTE    TORIBIO SEIBER  HYQ:[id number] DOB: Apr 16, 1950 DOA: 02/16/2023 PCP: Adrian Prince, MD  Subjective: Pt seen and examined. EGD yesterday showed Grade D Esophagitis. Placed on BID protonix. Pt ate his entire breakfast tray. No issues with odynophagia or regurgitation.  Discussed PRBC transfusion. He is agreeable to PRBC transfusion early tomorrow morning. Nephrology to order HD for Monday so that pt can get HD and then be discharged to SNF.  Outpatient HD has been arranged while he is at [facility]. [facility] on MWF 11:30 am chair time.   Pt c/o insomnia. Having difficulty sleeping in the hospital. Normally doesn't take any sleeping pills at home.   Hospital Course: HPI: ZEPLIN ALESHIRE is a 73 y.o. male with medical history significant for ESRD on TTS HD, CAD, moderate aortic stenosis, T2DM, HTN, HLD who presented to the ED for evaluation of right hip pain after a fall.   Patient states that he was changing a bucket on a bobcat inside of a concrete building when he lost his balance and fell backwards landing on the ground.  He had immediate pain to his right hip.  He denies hitting his head or losing consciousness.  EMS were called.  He was noted to have gross deformity of the right hip.  He was brought to the ED for further evaluation.   Patient takes aspirin 81 mg daily, no other blood thinners.  He denies any recent chest pain, dyspnea.  He reports attending usual HD, last session was yesterday 2/13.  His primary nephrologist is Dr. Glenna Fellows.   ED Course  Labs/Imaging on admission: I have personally reviewed following labs and imaging studies.   Initial vitals showed BP 110/50, pulse 73, RR 15, temp 98.2 F, SpO2 100% on room air.   Labs show serum glucose 535, sodium 130 (140 when corrected for hyperglycemia), potassium 3.8, bicarb 24, BUN 30, creatinine 6.7, WBC 8.3, hemoglobin 10.3, platelets 190,000.   Pelvic/right femur x-ray showed a comminuted displaced  intertrochanteric right proximal femur fracture.   CT head without contrast negative for acute intracranial normality.   CT cervical spine without contrast negative for acute displaced fracture or traumatic listhesis of the C-spine.   Patient was given 10 units subcutaneous NovoLog, IV Dilaudid and fentanyl.  Orthopedics (Dr. Hulda Humphrey) were consulted and recommended medical admission.  The hospitalist service was consulted to admit.   Significant Events: Admitted 02/16/2023 for right intertrochanteric femur fracture 02-17-2023 Right IM nail for intertrochanteric fracture 02/19/2023, 1 unit PRBC transfusion for hemoglobin 6.4. 02/20/2023, overnight with some delirium after hemodialysis.  Clinically improving.  Needs a skilled nursing facility with dialysis transport.  Significant Labs: WBC 8.3, Hg 10.3, Plt 190 Na 133, K 3.3, Cl 94, BUN 34, Scr 7.83  Significant Imaging Studies: Right femur XR shows Comminuted displaced intertrochanteric right proximal femur fracture. CT head/c-spine shows No acute intracranial abnormality. 2. No acute displaced fracture or traumatic listhesis of the cervical spine. CXR shows Chronic cardiomegaly. No acute chest findings.   Antibiotic Therapy: Anti-infectives (From admission, onward)    Start     Dose/Rate Route Frequency Ordered Stop   02/17/23 0731  ceFAZolin (ANCEF) 2-4 GM/100ML-% IVPB       Note to Pharmacy: Eliott Nine K: cabinet override      02/17/23 0731 02/17/23 1944      Procedures: 02-17-2023 IM nail right femur 02-24-2023 EGD showed Grade D Esophagitis.  Consultants: Nephrology Orthopedics GI    Assessment and Plan: * Closed comminuted  intertrochanteric fracture of proximal end of femur with nonunion, right 02-16-2023 through 02-22-2023 Status post ORIF, IM nail right femur. Dr Hulda Humphrey 2/15.  Weightbearing as tolerated.  Adequate pain control with oral opiates.  Prefers tramadol. Continue to work with PT OT. DVT prophylaxis, Subcu heparin  while in the hospital.  Aspirin 81 mg twice daily after starting mobilizing. 02-23-2023 s/p ORIF on 02-17-2023. Preferred opiate therapy in ESRD patients is either fentanyl or dilaudid. No use for IV dilaudid at this time. Pt is POD#6. Change to po dilaudid 1 mg q4h prn pain. Medically stable for DC to SNF 02-24-2023 discussed with ortho surgery yesterday. Keep dressing on for the next 2 days. And Then can removed and keep it open to air. When he goes to SNF next week. Wound should be kept open to air.  02-25-2023 stable. Surgical dressing can be removed tomorrow and kept open to air  Esophagitis determined by endoscopy - 02-24-2023 02-25-2020 seen on EGD yesterday. GI recommended for pt to stay on protonix 40 mg bid x 12 weeks then qday afterwards. Will need repeat EGD in 2-3 months. Will send ambulatory referral to [facility] for repeat endo in 3 months.  S/P ORIF (open reduction internal fixation) fracture - right intertrochanteric fracture 02-23-2023 DVT prophylaxis SQ heparin while in the hospital. @ discharge, pt will be changed to ASA 81 mg bid per ortho. Pt is weight bearing as tolerated. 02-24-2023 discussed with ortho surgery yesterday. Keep dressing on for the next 2 days. And Then can removed and keep it open to air. When he goes to SNF next week. Wound should be kept open to air.   02-25-2023 stable. Surgical dressing can be removed tomorrow and kept open to air   Insomnia due to medical condition 02-25-2023 pt c/o of insomnia. Has never taken any sleeping pills before. Will order 10 mg melatonin at bedtime and some prn trazodone if melatonin doesn't work.  Hiccups 02-24-2023 on IV reglan prn. May be due to GE stricture or what ever is causing barium tablet obstruction. GI to evaluate this today with EGD.  02-25-2023 continue prn reglan.  Delirium due to multiple etiologies Pt had hospital-acquired delirium. Multiple etiologies.  Pt mentation finally cleared and he was AxOx3 by the  time he was discharged.  Globus sensation 02-23-2023 pt's POA [name] mentioned pt having globus sensation. Will check esophagram to make sure there isn't a foreign body in his esophagus or a retained pill fragment. 02-24-2023 GI consult. Has been NPO since MN. Potential for EGD today +/- esophageal dilatation.  02-25-2023 has esophagitis seen on EGD.  See "Esophagitis determined by endoscopy"  Acute postoperative anemia due to expected blood loss 02-19-2023 received 1 unit PRBC for HGB of 6.4 g/dl. Had surgery on 02-17-2023 02-23-2023 post-transfusion Hgb 7.4 on 02-20-2023.  02-25-2023 plan on giving him 2 unit of PRBC during early AM on 02-26-2023. Hopefully he can get HD during AM on 02-26-2023 and then DC to SNF.  ESRD on hemodialysis (HCC) - out-pt HD at [facility] on TTS 5:15 am chair time 02-23-2023 pt seen by nephrology and has had routine HD via left UE AVF/AVG.  CM looking for outpatient HD bed near [facility] in [place].  Pt has insurance authorization and SNF bed offer.  02-24-2023 Hemodialysis coordinator has secured spot for outpatient HD for patient. Will be M, W, F @ [facility] on MWF 11:30 am chair time. Note, this is different from his home HD scheduled which is at [facility]  [place] on TTS 5:15 am chair time. Some of his outpatient medications such as HTN will need to be adjusted to accommodate change in HD session days-of-the-week.  02-25-2023 should get HD tomorrow. Pt refused HD yesterday.  Outpatient HD arranged for SNF @  [facility] on MWF 11:30 am chair time.   Type 2 diabetes mellitus with hyperglycemia (HCC) 02-23-2023 on lantus 12 u at bedtime, mealtime novolog 3 units plus SSI. 02-24-2023 stable.  02-25-2023 stable. CBG acceptable ranges  Anemia in chronic kidney disease 02-23-2023 stable. On Aranesp qweekly.  Essential hypertension 02-23-2023. Continue with Toprol-xl 50 mg qday, imdur 30 mg qSun, Tues, Thurs, Sat, norvasc 10 mg daily.  02-24-2023.  Stable. Should be on Imdur 30 mg daily on Non-HD days. Since he will be getting HD at [facility] on M, W, F. He should get the Imdur on T, Th, Sat, Sun  02-25-2023 stable.  Coronary artery disease involving native coronary artery of native heart without angina pectoris 02-23-2023 stable. On crestor 40 mg every day, Toprol-XL 50 mg qday, imdur 30 mg q Sun, Tues, Thurs, Sat.  02-24-2023 stable. Last [facility] 09-29-2022. Plan on PRBC transfusion on Monday, Feb 26, 2023 with/before HD.  LHC Showed:  1.  Normal right heart hemodynamics with preserved cardiac output of 8.96 L/min and cardiac index of 4.5 L/min/m (likely high flow with hemodialysis/AV fistula) 2.  Moderate aortic stenosis with mean gradient 20 mmHg, calculated valve area 2.2 cm likely overestimated with high flow state.  Valve crossed with a J-wire. 3.  Patent left main with no stenosis 4.  Patent LAD with moderate nonobstructive stenosis stable from the previous study, moderately severe first diagonal stenosis of 70% 5.  Severe proximal left circumflex stenosis, supplies very small territory myocardium 6.  Patent, large dominant RCA with mild nonobstructive plaquing and severe stenosis of the PDA branch stable from the previous study   Recommendations: Favor continued Risk analyst.  The patient has moderate aortic stenosis at most.  His coronary artery disease should be managed medically and in absence of angina.  He has branch vessel disease with significant stenoses in a diagonal branch, AV circumflex that supplies a very small vascular territory, and right PDA.  Nonrheumatic aortic valve stenosis 02-23-2023 moderate per cardiology noted 12-06-2022.  Managed by Dr. Bing Matter. 02-24-2023 stable. Last LHC/RHC 09-29-2022.  "Moderate aortic stenosis with mean gradient 20 mmHg, calculated valve area 2.2 cm likely overestimated with high flow state."   Hyperlipidemia 02-23-2023 stable. On crestor 40 mg daily. 02-24-2023 continue crestor 40  mg daily.   DVT prophylaxis: Place and maintain sequential compression device Start: 02/24/23 1837 SCDs Start: 02/16/23 2147    Code Status: Full Code Family Communication: no family at bedside. Pt is decisional. Disposition Plan: SNF Reason for continuing need for hospitalization: getting PRBC transfusion today. Should be ready for DC to SNF tomorrow PM after HD(AM of 02-26-23)  Objective: Vitals:   02/24/23 1515 02/24/23 2031 02/25/23 0605 02/25/23 0722  BP: (!) 130/53 (!) 155/65 (!) 169/65 (!) 146/61  Pulse: 64 73 76 79  Resp: (!) 23 17 17 17   Temp: 98.6 F (37 C) 98.4 F (36.9 C) 98.4 F (36.9 C) 98.4 F (36.9 C)  TempSrc:  Oral Oral Oral  SpO2: 93% 99% 99% 98%  Weight:      Height:        Intake/Output Summary (Last 24 hours) at 02/25/2023 0956 Last data filed at 02/25/2023 0254 Gross per 24 hour  Intake 100 ml  Output  75 ml  Net 25 ml   Filed Weights   02/19/23 1340 02/19/23 1733 02/22/23 0817  Weight: 84.1 kg 82.6 kg 84 kg    Examination:  Physical Exam Vitals and nursing note reviewed.  Constitutional:      General: He is not in acute distress.    Appearance: He is normal weight. He is not toxic-appearing or diaphoretic.  HENT:     Head: Normocephalic and atraumatic.     Nose: Nose normal.  Eyes:     General: No scleral icterus. Cardiovascular:     Rate and Rhythm: Normal rate and regular rhythm.     Pulses: Normal pulses.     Heart sounds: Murmur heard.     Comments: Murmur consistent with known aortic stenosis. Pulmonary:     Effort: Pulmonary effort is normal.  Abdominal:     General: Abdomen is flat. Bowel sounds are normal. There is no distension.     Palpations: Abdomen is soft.  Musculoskeletal:     Right lower leg: No edema.     Left lower leg: No edema.  Skin:    General: Skin is warm and dry.     Capillary Refill: Capillary refill takes less than 2 seconds.  Neurological:     General: No focal deficit present.     Mental Status: He  is alert and oriented to person, place, and time.    Data Reviewed: I have personally reviewed following labs and imaging studies  CBC: Recent Labs  Lab 02/19/23 0533 02/20/23 0847 02/24/23 0449  WBC 9.4 9.8 8.2  NEUTROABS 6.8 7.4 5.7  HGB 6.4* 8.2* 7.7*  HCT 18.0* 22.8* 22.0*  MCV 89.1 87.4 87.6  PLT 148* 150 271   Basic Metabolic Panel: Recent Labs  Lab 02/19/23 0533 02/24/23 0449  NA 132* 134*  K 4.4 4.3  CL 89* 92*  CO2 24 23  GLUCOSE 371* 196*  BUN 79* 83*  CREATININE 11.25* 9.63*  CALCIUM 8.2* 8.4*  PHOS 7.8* 7.0*   GFR: Estimated Creatinine Clearance: 6.9 mL/min (A) (by C-G formula based on SCr of 9.63 mg/dL (H)). Liver Function Tests: Recent Labs  Lab 02/19/23 0533 02/24/23 0449  ALBUMIN 2.8* 2.6*   CBG: Recent Labs  Lab 02/24/23 1123 02/24/23 1501 02/24/23 1652 02/24/23 2029 02/25/23 0626  GLUCAP 155* 158* 207* 202* 136*   Anemia Panel: Recent Labs    02/24/23 0449  FERRITIN 5,822*  TIBC 171*  IRON 57   Recent Results (from the past 240 hours)  Surgical PCR screen     Status: None   Collection Time: 02/17/23  2:45 AM   Specimen: Nasal Mucosa; Nasal Swab  Result Value Ref Range Status   MRSA, PCR NEGATIVE NEGATIVE Final   Staphylococcus aureus NEGATIVE NEGATIVE Final    Comment: (NOTE) The Xpert SA Assay (FDA approved for NASAL specimens in patients 58 years of age and older), is one component of a comprehensive surveillance program. It is not intended to diagnose infection nor to guide or monitor treatment. Performed at [facility], [address], [place], [place] [address]    Radiology Studies: DG ESOPHAGUS W SINGLE CM (SOL OR THIN BA) Result Date: 02/23/2023 CLINICAL DATA:  73 year old male with chronic globus sensation. Request for esophagram. EXAM: ESOPHAGUS/BARIUM SWALLOW/TABLET STUDY TECHNIQUE: Single contrast examination was performed using thin liquid barium. This exam was performed by Lawernce Ion, PA-C, and was  supervised and interpreted by Richarda Overlie,. The exam was extremely limited due to  patient's limited mobility and right hip. All images were taken in supine LPO position. FLUOROSCOPY: Radiation Exposure Index (as provided by the fluoroscopic device): 14.9 mGy Kerma COMPARISON:  None Available. FINDINGS: Swallowing: No aspiration or penetration seen during this limited exam. Pharynx: Unremarkable. Esophagus: Fluoroscopic evaluation demonstrates normal caliber and smooth contour of the esophagus. No evidence of fixed stricture, mass or mucosal abnormality. Esophageal motility: Decreased.  Few tertiary contractions. Hiatal Hernia: Probable small hiatal hernia Gastroesophageal reflux: Episode of spontaneous reflux noted into the upper esophagus. Ingested 13mm barium tablet: The 13 mm barium tablet stuck in the distal esophagus above the GE junction. The tablet did not move distally with additional swallows of thin. No focal mucosal abnormality or stricture identified. Other: None. IMPRESSION: 1. 13 mm barium tablet got stuck in the distal esophagus of uncertain etiology. There is not a gross mucosal abnormality or stricture in this area. Findings may be associated with dysmotility but consider further evaluation with endoscopy. 2. Probable small hiatal hernia. 3. Evidence for spontaneous gastroesophageal reflux. Electronically Signed   By: Richarda Overlie M.D.   On: 02/23/2023 16:43    Scheduled Meds:  sodium chloride   Intravenous Once   amLODipine  10 mg Oral Daily   Chlorhexidine Gluconate Cloth  6 each Topical Q0600   darbepoetin (ARANESP) injection - DIALYSIS  100 mcg Subcutaneous Q Sat-1800   docusate sodium  100 mg Oral BID   feeding supplement (NEPRO CARB STEADY)  237 mL Oral TID BM   ferric citrate  630 mg Oral TID WC   insulin aspart  0-5 Units Subcutaneous QHS   insulin aspart  0-6 Units Subcutaneous TID WC   insulin aspart  3 Units Subcutaneous TID WC   insulin glargine-yfgn  12 Units Subcutaneous QHS    isosorbide mononitrate  30 mg Oral Once per day on Sunday Tuesday Thursday Saturday   lactulose  20 g Oral BID   melatonin  10 mg Oral QHS   methocarbamol  500 mg Oral Q12H   metoprolol succinate  50 mg Oral Daily   multivitamin  1 tablet Oral QHS   pantoprazole  40 mg Oral BID   rosuvastatin  40 mg Oral Daily   Continuous Infusions:   LOS: 9 days   Time spent: 45 minutes  Carollee Herter, DO  [facility]  02/25/2023, 9:56 AM

## 2023-02-26 ENCOUNTER — Telehealth: Payer: Self-pay

## 2023-02-26 DIAGNOSIS — E1129 Type 2 diabetes mellitus with other diabetic kidney complication: Secondary | ICD-10-CM | POA: Diagnosis not present

## 2023-02-26 DIAGNOSIS — I350 Nonrheumatic aortic (valve) stenosis: Secondary | ICD-10-CM | POA: Diagnosis not present

## 2023-02-26 DIAGNOSIS — E8779 Other fluid overload: Secondary | ICD-10-CM | POA: Insufficient documentation

## 2023-02-26 DIAGNOSIS — Z743 Need for continuous supervision: Secondary | ICD-10-CM | POA: Diagnosis not present

## 2023-02-26 DIAGNOSIS — N2581 Secondary hyperparathyroidism of renal origin: Secondary | ICD-10-CM | POA: Diagnosis not present

## 2023-02-26 DIAGNOSIS — W19XXXA Unspecified fall, initial encounter: Secondary | ICD-10-CM

## 2023-02-26 DIAGNOSIS — Z992 Dependence on renal dialysis: Secondary | ICD-10-CM | POA: Insufficient documentation

## 2023-02-26 DIAGNOSIS — Z794 Long term (current) use of insulin: Secondary | ICD-10-CM | POA: Diagnosis not present

## 2023-02-26 DIAGNOSIS — E7800 Pure hypercholesterolemia, unspecified: Secondary | ICD-10-CM | POA: Insufficient documentation

## 2023-02-26 DIAGNOSIS — E785 Hyperlipidemia, unspecified: Secondary | ICD-10-CM | POA: Diagnosis not present

## 2023-02-26 DIAGNOSIS — D631 Anemia in chronic kidney disease: Secondary | ICD-10-CM | POA: Diagnosis not present

## 2023-02-26 DIAGNOSIS — K219 Gastro-esophageal reflux disease without esophagitis: Secondary | ICD-10-CM

## 2023-02-26 DIAGNOSIS — D62 Acute posthemorrhagic anemia: Secondary | ICD-10-CM | POA: Diagnosis not present

## 2023-02-26 DIAGNOSIS — R509 Fever, unspecified: Secondary | ICD-10-CM | POA: Insufficient documentation

## 2023-02-26 DIAGNOSIS — G4700 Insomnia, unspecified: Secondary | ICD-10-CM | POA: Diagnosis not present

## 2023-02-26 DIAGNOSIS — R519 Headache, unspecified: Secondary | ICD-10-CM

## 2023-02-26 DIAGNOSIS — W19XXXD Unspecified fall, subsequent encounter: Secondary | ICD-10-CM | POA: Diagnosis not present

## 2023-02-26 DIAGNOSIS — N186 End stage renal disease: Secondary | ICD-10-CM | POA: Diagnosis not present

## 2023-02-26 DIAGNOSIS — E1122 Type 2 diabetes mellitus with diabetic chronic kidney disease: Secondary | ICD-10-CM | POA: Diagnosis not present

## 2023-02-26 DIAGNOSIS — I2510 Atherosclerotic heart disease of native coronary artery without angina pectoris: Secondary | ICD-10-CM | POA: Diagnosis not present

## 2023-02-26 DIAGNOSIS — I129 Hypertensive chronic kidney disease with stage 1 through stage 4 chronic kidney disease, or unspecified chronic kidney disease: Secondary | ICD-10-CM | POA: Diagnosis not present

## 2023-02-26 DIAGNOSIS — I959 Hypotension, unspecified: Secondary | ICD-10-CM | POA: Diagnosis not present

## 2023-02-26 DIAGNOSIS — S72141K Displaced intertrochanteric fracture of right femur, subsequent encounter for closed fracture with nonunion: Secondary | ICD-10-CM | POA: Diagnosis not present

## 2023-02-26 DIAGNOSIS — I1311 Hypertensive heart and chronic kidney disease without heart failure, with stage 5 chronic kidney disease, or end stage renal disease: Secondary | ICD-10-CM | POA: Diagnosis not present

## 2023-02-26 DIAGNOSIS — S72141D Displaced intertrochanteric fracture of right femur, subsequent encounter for closed fracture with routine healing: Secondary | ICD-10-CM | POA: Diagnosis not present

## 2023-02-26 DIAGNOSIS — R601 Generalized edema: Secondary | ICD-10-CM | POA: Diagnosis not present

## 2023-02-26 DIAGNOSIS — I120 Hypertensive chronic kidney disease with stage 5 chronic kidney disease or end stage renal disease: Secondary | ICD-10-CM | POA: Diagnosis not present

## 2023-02-26 HISTORY — DX: Gastro-esophageal reflux disease without esophagitis: K21.9

## 2023-02-26 HISTORY — DX: Pure hypercholesterolemia, unspecified: E78.00

## 2023-02-26 HISTORY — DX: Fever, unspecified: R50.9

## 2023-02-26 HISTORY — DX: Other fluid overload: E87.79

## 2023-02-26 HISTORY — DX: Headache, unspecified: R51.9

## 2023-02-26 HISTORY — DX: Unspecified fall, initial encounter: W19.XXXA

## 2023-02-26 HISTORY — DX: Dependence on renal dialysis: Z99.2

## 2023-02-26 LAB — BASIC METABOLIC PANEL
Anion gap: 20 — ABNORMAL HIGH (ref 5–15)
BUN: 138 mg/dL — ABNORMAL HIGH (ref 8–23)
CO2: 20 mmol/L — ABNORMAL LOW (ref 22–32)
Calcium: 8.8 mg/dL — ABNORMAL LOW (ref 8.9–10.3)
Chloride: 90 mmol/L — ABNORMAL LOW (ref 98–111)
Creatinine, Ser: 13.53 mg/dL — ABNORMAL HIGH (ref 0.61–1.24)
GFR, Estimated: 4 mL/min — ABNORMAL LOW (ref >60)
Glucose, Bld: 254 mg/dL — ABNORMAL HIGH (ref 70–99)
Potassium: 4.9 mmol/L (ref 3.5–5.1)
Sodium: 130 mmol/L — ABNORMAL LOW (ref 135–145)

## 2023-02-26 LAB — CBC WITH DIFFERENTIAL/PLATELET
Abs Immature Granulocytes: 0.44 10*3/uL — ABNORMAL HIGH (ref 0.00–0.07)
Basophils Absolute: 0.1 10*3/uL (ref 0.0–0.1)
Basophils Relative: 1 %
Eosinophils Absolute: 0.4 10*3/uL (ref 0.0–0.5)
Eosinophils Relative: 3 %
HCT: 27.7 % — ABNORMAL LOW (ref 39.0–52.0)
Hemoglobin: 10.1 g/dL — ABNORMAL LOW (ref 13.0–17.0)
Immature Granulocytes: 4 %
Lymphocytes Relative: 12 %
Lymphs Abs: 1.3 10*3/uL (ref 0.7–4.0)
MCH: 30.7 pg (ref 26.0–34.0)
MCHC: 36.5 g/dL — ABNORMAL HIGH (ref 30.0–36.0)
MCV: 84.2 fL (ref 80.0–100.0)
Monocytes Absolute: 1 10*3/uL (ref 0.1–1.0)
Monocytes Relative: 9 %
Neutro Abs: 7.9 10*3/uL — ABNORMAL HIGH (ref 1.7–7.7)
Neutrophils Relative %: 71 %
Platelets: 328 10*3/uL (ref 150–400)
RBC: 3.29 MIL/uL — ABNORMAL LOW (ref 4.22–5.81)
RDW: 14.2 % (ref 11.5–15.5)
WBC: 11.1 10*3/uL — ABNORMAL HIGH (ref 4.0–10.5)
nRBC: 0 % (ref 0.0–0.2)

## 2023-02-26 LAB — GLUCOSE, CAPILLARY: Glucose-Capillary: 245 mg/dL — ABNORMAL HIGH (ref 70–99)

## 2023-02-26 MED ORDER — TRAZODONE HCL 100 MG PO TABS: 100.0000 mg | ORAL_TABLET | Freq: Every evening | ORAL | Status: DC | PRN

## 2023-02-26 MED ORDER — PANTOPRAZOLE SODIUM 40 MG PO TBEC: 40.0000 mg | DELAYED_RELEASE_TABLET | Freq: Two times a day (BID) | ORAL | Status: DC

## 2023-02-26 NOTE — Telephone Encounter (Signed)
 Called patient per provider request to schedule a follow up visit in office. Patient stated he is in the hospital and to call him back.

## 2023-02-26 NOTE — Progress Notes (Signed)
 In preparation for 2 units of blood. Blood bank called to clarify blood bank bracelet is still good. Last type screen noted 2/22 date on bracelet 2/15. Consent in chart 2/17. Pt education on signs and symptoms of blood transfusion. Pensions consultant for blood.

## 2023-02-26 NOTE — Progress Notes (Signed)
 Advised by CSW that pt will d/c to snf today. Contacted [facility] to be advised that pt will d/c to snf today and that pt should start on Wednesday. Renal NP sent orders to clinic on Friday. Arrangements added to AVS as well.   Olivia Canter Renal Navigator [phone]

## 2023-02-26 NOTE — Progress Notes (Signed)
 PT Cancellation Note  Patient Details Name: Justin [name] MRN: [id number] DOB: [birth year]   Cancelled Treatment:    Reason Eval/Treat Not Completed: Patient at procedure or test/unavailable.  Pt is in HD.  PT to check back later today or tomorrow as time allows.  Thanks,  Corinna Capra, PT, DPT  Acute Rehabilitation Secure chat is best for contact #[phone] office      Dimas Aguas 02/26/2023, 10:53 AM

## 2023-02-26 NOTE — Progress Notes (Signed)
 DISCHARGE NOTE SNF [name] to be discharged Rehab per MD order. Patient verbalized understanding.  Going to [employer] clean, dry and intact without evidence of skin break down, no evidence of skin tears noted. IV catheter discontinued intact. Site without signs and symptoms of complications. Dressing and pressure applied. Pt denies pain at the site currently. No complaints noted.  Patient free of lines, drains, and wounds Other than on [facility]  Discharge packet assembled. An After Visit Summary (AVS) was printed and given to the EMS personnel. Patient escorted via stretcher and discharged to [facility] via ambulance. Report called to accepting facility by floor nurse ; all questions and concerns addressed.  Taken to Discharge lounge to await transport Velia Meyer, RN

## 2023-02-26 NOTE — Plan of Care (Addendum)
 Pt received 1 unit of blood this shift. Currently 2nd unit infusing. Pt becoming agitated this am. Refused chg wipes said he might do it later but not now he wants to try to sleep a little Problem: Education: Goal: Ability to describe self-care measures that may prevent or decrease complications (Diabetes Survival Skills Education) will improve Outcome: Progressing Goal: Individualized Educational Video(s) Outcome: Progressing   Problem: Coping: Goal: Ability to adjust to condition or change in health will improve Outcome: Progressing   Problem: Fluid Volume: Goal: Ability to maintain a balanced intake and output will improve Outcome: Progressing   Problem: Health Behavior/Discharge Planning: Goal: Ability to identify and utilize available resources and services will improve Outcome: Progressing Goal: Ability to manage health-related needs will improve Outcome: Progressing   Problem: Metabolic: Goal: Ability to maintain appropriate glucose levels will improve Outcome: Progressing   Problem: Nutritional: Goal: Maintenance of adequate nutrition will improve Outcome: Progressing Goal: Progress toward achieving an optimal weight will improve Outcome: Progressing   Problem: Skin Integrity: Goal: Risk for impaired skin integrity will decrease Outcome: Progressing   Problem: Tissue Perfusion: Goal: Adequacy of tissue perfusion will improve Outcome: Progressing   Problem: Education: Goal: Knowledge of General Education information will improve Description: Including pain rating scale, medication(s)/side effects and non-pharmacologic comfort measures Outcome: Progressing   Problem: Health Behavior/Discharge Planning: Goal: Ability to manage health-related needs will improve Outcome: Progressing   Problem: Clinical Measurements: Goal: Ability to maintain clinical measurements within normal limits will improve Outcome: Progressing Goal: Will remain free from  infection Outcome: Progressing Goal: Diagnostic test results will improve Outcome: Progressing Goal: Respiratory complications will improve Outcome: Progressing Goal: Cardiovascular complication will be avoided Outcome: Progressing   Problem: Activity: Goal: Risk for activity intolerance will decrease Outcome: Progressing   Problem: Nutrition: Goal: Adequate nutrition will be maintained Outcome: Progressing   Problem: Coping: Goal: Level of anxiety will decrease Outcome: Progressing   Problem: Elimination: Goal: Will not experience complications related to bowel motility Outcome: Progressing Goal: Will not experience complications related to urinary retention Outcome: Progressing   Problem: Pain Managment: Goal: General experience of comfort will improve and/or be controlled Outcome: Progressing   Problem: Safety: Goal: Ability to remain free from injury will improve Outcome: Progressing   Problem: Skin Integrity: Goal: Risk for impaired skin integrity will decrease Outcome: Progressing

## 2023-02-26 NOTE — Discharge Planning (Signed)
 Patient alert. IV access removed.  Discharge teaching given to [facility] RN at [facility]. Discharge summary placed in discharge packet. Patient will be transported via personal vehicle via his friend [name].

## 2023-02-26 NOTE — Progress Notes (Signed)
   02/26/23 1342  Vitals  Temp 97.6 F (36.4 C)  Pulse Rate 69  Resp 16  BP (!) 110/48  SpO2 100 %  O2 Device Room Air  Weight (S)  78.3 kg (Bed Scale)  Type of Weight Post-Dialysis  Oxygen Therapy  Patient Activity (if Appropriate) In bed  Pulse Oximetry Type Continuous  Post Treatment  Dialyzer Clearance Clear  Hemodialysis Intake (mL) 0 mL  Liters Processed 72  Fluid Removed (mL) 2000 mL  Tolerated HD Treatment Yes  Post-Hemodialysis Comments Pt. tolerated HD without difficulties. UF goal met and Report call to 5N bedside LPN  AVG/AVF Arterial Site Held (minutes) 5 minutes  AVG/AVF Venous Site Held (minutes) 5 minutes   Received patient in bed to unit.  Alert and oriented.  Informed consent signed and in chart.   TX duration: 3  Patient tolerated well.  Transported back to the room  Alert, without acute distress.  Hand-off given to patient's nurse.   Access used: Yes Access issues: No   Total UF removed: 2000 Medication(s) given: See MAR Post HD VS: See Above Grid Post HD weight: 78.3 kg   [facility]

## 2023-02-26 NOTE — Discharge Summary (Signed)
 [facility] Phys[facility] Discharge Summary   Patient name: Justin [name]  Admit date:     02/16/2023  Discharge date: 02/26/2023  Attending Physician: Charlsie Quest [[id number]]  Discharge Physician: Dorcas Carrow   PCP: Adrian Prince, MD  Admitted From: Home  Disposition:   [facility]  Recommendations for Outpatient Follow-up:  Follow up with PCP in 1-2 weeks Follow up with Dr. Thornell Mule with [facility]. Call for appointment to be seen in 4-6 weeks. Office number is [phone] Resume dialysis 2/26.   Home Health:No Equipment/Devices: None  Discharge Condition:Stable CODE STATUS:FULL Diet recommendation: Renal/Diabetic Fluid Restriction:  2000 ml/day  Hospital Summary: HPI: Justin [name] is a 73 y.o. male with medical history significant for ESRD on TTS HD, CAD, moderate aortic stenosis, T2DM, HTN, HLD who presented to the ED for evaluation of right hip pain after a fall.   Patient states that he was changing a bucket on a bobcat inside of a concrete building when he lost his balance and fell backwards landing on the ground.  He had immediate pain to his right hip.  He denies hitting his head or losing consciousness.  EMS were called.  He was noted to have gross deformity of the right hip.  He was brought to the ED for further evaluation.   ED Course  Labs/Imaging on admission: I have personally reviewed following labs and imaging studies.   Initial vitals showed BP 110/50, pulse 73, RR 15, temp 98.2 F, SpO2 100% on room air.   Labs show serum glucose 535, sodium 130 (140 when corrected for hyperglycemia), potassium 3.8, bicarb 24, BUN 30, creatinine 6.7, WBC 8.3, hemoglobin 10.3, platelets 190,000.   Pelvic/right femur x-ray showed a comminuted displaced intertrochanteric right proximal femur fracture.   CT head without contrast negative for acute intracranial normality.   CT cervical spine without contrast negative for acute displaced  fracture or traumatic listhesis of the C-spine.   Patient was given 10 units subcutaneous NovoLog, IV Dilaudid and fentanyl.  Orthopedics (Dr. Hulda Humphrey) were consulted and recommended medical admission.  The hospitalist service was consulted to admit.   Significant Events: Admitted 02/16/2023 for right intertrochanteric femur fracture 02-17-2023 Right IM nail for intertrochanteric fracture 02/19/2023, 1 unit PRBC transfusion for hemoglobin 6.4. 02/20/2023, overnight with some delirium after hemodialysis.  Clinically improving.    Significant Imaging Studies: Right femur XR shows Comminuted displaced intertrochanteric right proximal femur fracture. CT head/c-spine shows No acute intracranial abnormality. 2. No acute displaced fracture or traumatic listhesis of the cervical spine. CXR shows Chronic cardiomegaly. No acute chest findings.      Procedures: 02-17-2023 IM nail right femur 02-24-2023 EGD showed Grade D Esophagitis.  Consultants: Nephrology Orthopedics GI  Hospital Course by Problem: Closed comminuted intertrochanteric fracture of proximal end of femur with nonunion, right Status post ORIF, IM nail right femur. Dr Hulda Humphrey 2/15.   Weightbearing as tolerated.  Adequate pain control with oral opiates.  Continue to work with PT OT.  DVT prophylaxis, Subcu heparin while in the hospital.  Aspirin 81 mg twice daily after starting mobilizing. Change to po dilaudid 1 mg q4h prn pain. Medically stable for DC to SNF Dressing can be removed and leave open to air.   Esophagitis determined by endoscopy - 02-24-2023 02-25-2020 seen on EGD. GI recommended for pt to stay on protonix 40 mg bid x 12 weeks then qday afterwards. Will need repeat EGD in 2-3 months. Will send ambulatory referral to  GI for repeat  endo in 3 months.  Insomnia due to medical condition 02-25-2023 pt c/o of insomnia. Has never taken any sleeping pills before. Will order some prn trazodone   Hiccups Due to  esophagitis. GERD precautions. continue prn reglan.  Delirium due to multiple etiologies Pt had hospital-acquired delirium. Multiple etiologies.  Pt mentation finally cleared and he was AxOx3 by the time he was discharged.  Globus sensation "Esophagitis determined by endoscopy". PPI on discharge.  Acute postoperative anemia due to expected blood loss 02-19-2023 received 1 unit PRBC for HGB of 6.4 g/dl. Had surgery on 02-17-2023 02-23-2023 post-transfusion Hgb 7.4 on 02-20-2023. 02/25/2023, 2 units of PRBC before dialysis. 02/26/2023, posttransfusion hemoglobin 10.  ESRD on hemodialysis (HCC) - out-pt HD at [facility] on TTS 5:15 am chair time 02-23-2023 pt seen by nephrology and has had routine HD via left UE AVF/AVG.  CM looking for outpatient HD bed near [facility] in [place].  Pt has insurance authorization and SNF bed offer.  02-24-2023 Hemodialysis coordinator has secured spot for outpatient HD for patient. Will be M, W, F @ [facility] on MWF 11:30 am chair time. Note, this is different from his home HD scheduled which is at [facility] on TTS 5:15 am chair time. Some of his outpatient medications such as HTN will need to be adjusted to accommodate change in HD session days-of-the-week.  02-25-2023 should get HD today.  Outpatient HD arranged for SNF @  [facility] [place] on MWF 11:30 am chair time.   Type 2 diabetes mellitus with hyperglycemia (HCC) 02-23-2023 on lantus 12 u at bedtime, mealtime novolog 3 units plus SSI. 02-24-2023 stable. 02-25-2023 stable. CBG acceptable ranges  Essential hypertension 02-23-2023. Continue with Toprol-xl 50 mg qday, imdur 30 mg qSun, Tues, Thurs, Sat, norvasc 10 mg daily.  Coronary artery disease involving native coronary artery of native heart without angina pectoris 02-23-2023 stable. On crestor 40 mg every day, Toprol-XL 50 mg qday, imdur 30 mg q Sun, Tues, Thurs, Sat.  Nonrheumatic aortic valve stenosis 02-23-2023 moderate per  cardiology noted 12-06-2022.  Managed by Dr. Bing Matter. 02-24-2023 stable. Last LHC/RHC 09-29-2022.  "Moderate aortic stenosis with mean gradient 20 mmHg, calculated valve area 2.2 cm likely overestimated with high flow state."   Hyperlipidemia On crestor 40 mg daily.  Patient is medically stable today.  He is due to get dialysis and can be transferred to a skilled nursing facility after dialysis today.    Discharge Diagnoses:  Principal Problem:   Closed comminuted intertrochanteric fracture of proximal end of femur with nonunion, right Active Problems:   S/P ORIF (open reduction internal fixation) fracture - right intertrochanteric fracture   Esophagitis determined by endoscopy - 02-24-2023   ESRD on hemodialysis (HCC) - out-pt HD at [facility] on TTS 5:15 am chair time   Acute postoperative anemia due to expected blood loss   Globus sensation   Delirium due to multiple etiologies   Hiccups   Insomnia due to medical condition   Hyperlipidemia   Nonrheumatic aortic valve stenosis   Coronary artery disease involving native coronary artery of native heart without angina pectoris   Essential hypertension   Anemia in chronic kidney disease   Type 2 diabetes mellitus with hyperglycemia [facility])   Discharge Instructions  Discharge Instructions     Ambulatory referral to Gastroenterology   Complete by: As directed    What is the reason for referral?: Other Comment - repeat EGD in 2-3 months due to Grade D esophagitis seen on EGD on  02-24-2023.   Call MD for:  difficulty breathing, headache or visual disturbances   Complete by: As directed    Call MD for:  extreme fatigue   Complete by: As directed    Call MD for:  hives   Complete by: As directed    Call MD for:  persistant dizziness or light-headedness   Complete by: As directed    Call MD for:  persistant nausea and vomiting   Complete by: As directed    Call MD for:  redness, tenderness, or signs of infection (pain, swelling,  redness, odor or green/yellow discharge around incision site)   Complete by: As directed    Call MD for:  severe uncontrolled pain   Complete by: As directed    Call MD for:  temperature >100.4   Complete by: As directed    Diet - low sodium heart healthy   Complete by: As directed    Discharge instructions   Complete by: As directed    1. Follow up with Orthopedics Dr.Bradley Wham in 4-6 weeks. SNF to all for appointment. Office # is [phone] 2. Outpatient Hemodialysis will be on M, W, F while at SNF/rehab.   [facility] on MWF 11:30 am chair time. 3. Follow up with Primary Care Provider in 1-2 weeks after discharge from SNF   Increase activity slowly   Complete by: As directed    Increase activity slowly   Complete by: As directed    Weight bearing as tolerated   Complete by: As directed       Allergies as of 02/26/2023   No Known Allergies      Medication List     STOP taking these medications    NovoLIN 70/30 ReliOn (70-30) 100 UNIT/ML injection Generic drug: insulin NPH-regular Human       TAKE these medications    acetaminophen 500 MG tablet Commonly known as: TYLENOL Take 2 tablets (1,000 mg total) by mouth every 6 (six) hours as needed for mild pain (pain score 1-3), fever or headache.   amLODipine 10 MG tablet Commonly known as: NORVASC Take 1 tablet by mouth once daily   aspirin EC 81 MG tablet Take 1 tablet (81 mg total) by mouth in the morning and at bedtime for 42 days, THEN 1 tablet (81 mg total) daily. Swallow whole.. Start taking on: February 23, 2023 What changed: See the new instructions.   bisacodyl 5 MG EC tablet Commonly known as: DULCOLAX Take 1 tablet (5 mg total) by mouth daily as needed for moderate constipation.   cyanocobalamin 1000 MCG tablet Commonly known as: VITAMIN B12 Take 1,000 mcg by mouth daily.   docusate sodium 100 MG capsule Commonly known as: COLACE Take 1 capsule (100 mg total) by mouth 2 (two) times  daily.   ferric citrate 1 GM 210 MG(Fe) tablet Commonly known as: AURYXIA Take 630 mg by mouth 3 (three) times daily with meals.   hydrALAZINE 25 MG tablet Commonly known as: APRESOLINE Take 2 tablets (50 mg total) by mouth See admin instructions. Take 50 mg 3 times daily on M, W, F(dialysis days) Take 50 mg twice daily on Tu, Th, Sat, Sun(non-dialysis days) What changed: additional instructions   HYDROmorphone 2 MG tablet Commonly known as: DILAUDID Take 0.5 tablets (1 mg total) by mouth every 6 (six) hours as needed for severe pain (pain score 7-10) or moderate pain (pain score 4-6).   insulin aspart 100 UNIT/ML injection Commonly known as: novoLOG Inject 0-6  Units into the skin 3 (three) times daily with meals. CBG 70 - 120: 0 units CBG 121 - 150: 0 units CBG 151 - 200: 1 unit CBG 201-250: 2 units CBG 251-300: 3 units CBG 301-350: 4 units CBG 351-400: 5 units CBG > 400: Give 6 units and call MD   insulin aspart 100 UNIT/ML injection Commonly known as: novoLOG Inject 3 Units into the skin 3 (three) times daily with meals.   insulin glargine-yfgn 100 UNIT/ML injection Commonly known as: SEMGLEE Inject 0.12 mLs (12 Units total) into the skin at bedtime.   isosorbide mononitrate 30 MG 24 hr tablet Commonly known as: IMDUR Take 1 tablet (30 mg total) by mouth 4 (four) times a week. Mon, Wed, Fri, Sun (non-dialysis days)   lactulose 10 GM/15ML solution Commonly known as: CHRONULAC Take 30 mLs (20 g total) by mouth 2 (two) times daily.   methocarbamol 500 MG tablet Commonly known as: ROBAXIN Take 1 tablet (500 mg total) by mouth every 8 (eight) hours as needed for muscle spasms.   metoCLOPramide 10 MG tablet Commonly known as: REGLAN Take 1 tablet (10 mg total) by mouth every 6 (six) hours as needed for nausea or vomiting (hiccups).   metoprolol succinate 50 MG 24 hr tablet Commonly known as: TOPROL-XL Take 50 mg by mouth daily.   MIRCERA IJ   multivitamin Tabs  tablet Take 1 tablet by mouth at bedtime.   nitroGLYCERIN 0.4 MG SL tablet Commonly known as: NITROSTAT Place 0.4 mg under the tongue every 5 (five) minutes as needed for chest pain.   ondansetron 4 MG tablet Commonly known as: Zofran Take 1 tablet (4 mg total) by mouth every 6 (six) hours as needed for nausea or vomiting.   pantoprazole 40 MG tablet Commonly known as: PROTONIX Take 1 tablet (40 mg total) by mouth 2 (two) times daily.   rosuvastatin 40 MG tablet Commonly known as: CRESTOR Take 40 mg by mouth daily.   simethicone 80 MG chewable tablet Commonly known as: MYLICON Chew 2 tablets (160 mg total) by mouth every 6 (six) hours as needed for flatulence.   traZODone 100 MG tablet Commonly known as: DESYREL Take 1 tablet (100 mg total) by mouth at bedtime as needed for sleep.               Discharge Care Instructions  (From admission, onward)           Start     Ordered   02/23/23 0000  Weight bearing as tolerated        02/23/23 1337            Follow-up Information     [facility]. Go on 02/28/2023.   Why: Schedule is Monday, Wednesday, Friday with 11:30 am chair time.  On Wednesday (2/26), please arrive at 10:45 am to complete paperwork prior to treatment.  Please send hoyer pad to each HD appointment. Contact information: [address] [place] [address] [phone]                No Known Allergies  Discharge Exam: Vitals:   02/26/23 0945 02/26/23 1024  BP:  (!) 118/52  Pulse:  70  Resp:  19  Temp: 98 F (36.7 C)   SpO2:  99%    General: Looks fairly comfortable today.  Is still having some hiccups.  On room air. Cardiovascular: S1-S2 normal.  Regular rate rhythm. Respiratory: Bilateral clear.  No added sounds. Gastrointestinal: Soft and nontender.  Bowel sound present. Ext: Left upper extremity AV fistula with thrill present. Right lateral thigh incisions clean and dry.  Distal neurovascular status  intact. Neuro: Intact.    The results of significant diagnostics from this hospitalization (including imaging, microbiology, ancillary and laboratory) are listed below for reference.    Microbiology: Recent Results (from the past 240 hours)  Surgical PCR screen     Status: None   Collection Time: 02/17/23  2:45 AM   Specimen: Nasal Mucosa; Nasal Swab  Result Value Ref Range Status   MRSA, PCR NEGATIVE NEGATIVE Final   Staphylococcus aureus NEGATIVE NEGATIVE Final    Comment: (NOTE) The Xpert SA Assay (FDA approved for NASAL specimens in patients 30 years of age and older), is one component of a comprehensive surveillance program. It is not intended to diagnose infection nor to guide or monitor treatment. Performed at [facility], [address], [place], [place] [address]      Labs: BNP (last 3 results) Recent Labs    06/05/22 1010  BNP 1,835.7*   Basic Metabolic Panel: Recent Labs  Lab 02/24/23 0449  NA 134*  K 4.3  CL 92*  CO2 23  GLUCOSE 196*  BUN 83*  CREATININE 9.63*  CALCIUM 8.4*  PHOS 7.0*   Liver Function Tests: Recent Labs  Lab 02/24/23 0449  ALBUMIN 2.6*    CBC: Recent Labs  Lab 02/20/23 0847 02/24/23 0449 02/26/23 1020  WBC 9.8 8.2 11.1*  NEUTROABS 7.4 5.7 7.9*  HGB 8.2* 7.7* 10.1*  HCT 22.8* 22.0* 27.7*  MCV 87.4 87.6 84.2  PLT 150 271 328   CBG: Recent Labs  Lab 02/25/23 0626 02/25/23 1103 02/25/23 1609 02/25/23 2024 02/26/23 0534  GLUCAP 136* 237* 224* 238* 245*   Sepsis Labs Recent Labs  Lab 02/20/23 0847 02/24/23 0449 02/26/23 1020  WBC 9.8 8.2 11.1*    Procedures/Studies: DG ESOPHAGUS W SINGLE CM (SOL OR THIN BA) Result Date: 02/23/2023 CLINICAL DATA:  73 year old male with chronic globus sensation. Request for esophagram. EXAM: ESOPHAGUS/BARIUM SWALLOW/TABLET STUDY TECHNIQUE: Single contrast examination was performed using thin liquid barium. This exam was performed by Lawernce Ion, PA-C, and was supervised  and interpreted by Richarda Overlie,. The exam was extremely limited due to patient's limited mobility and right hip. All images were taken in supine LPO position. FLUOROSCOPY: Radiation Exposure Index (as provided by the fluoroscopic device): 14.9 mGy Kerma COMPARISON:  None Available. FINDINGS: Swallowing: No aspiration or penetration seen during this limited exam. Pharynx: Unremarkable. Esophagus: Fluoroscopic evaluation demonstrates normal caliber and smooth contour of the esophagus. No evidence of fixed stricture, mass or mucosal abnormality. Esophageal motility: Decreased.  Few tertiary contractions. Hiatal Hernia: Probable small hiatal hernia Gastroesophageal reflux: Episode of spontaneous reflux noted into the upper esophagus. Ingested 13mm barium tablet: The 13 mm barium tablet stuck in the distal esophagus above the GE junction. The tablet did not move distally with additional swallows of thin. No focal mucosal abnormality or stricture identified. Other: None. IMPRESSION: 1. 13 mm barium tablet got stuck in the distal esophagus of uncertain etiology. There is not a gross mucosal abnormality or stricture in this area. Findings may be associated with dysmotility but consider further evaluation with endoscopy. 2. Probable small hiatal hernia. 3. Evidence for spontaneous gastroesophageal reflux. Electronically Signed   By: Richarda Overlie M.D.   On: 02/23/2023 16:43   DG CHEST PORT 1 VIEW Result Date: 02/22/2023 CLINICAL DATA:  Shortness of breath.  Hypertension. EXAM: PORTABLE CHEST 1 VIEW  COMPARISON:  Chest radiograph dated 02/16/2023. FINDINGS: No focal consolidation, pleural effusion, or pneumothorax. Mild cardiomegaly. Atherosclerotic calcification of the aorta. No acute osseous pathology. IMPRESSION: 1. No active disease. 2. Mild cardiomegaly. Electronically Signed   By: Elgie Collard M.D.   On: 02/22/2023 14:52   DG HIP UNILAT WITH PELVIS 2-3 VIEWS RIGHT Result Date: 02/17/2023 CLINICAL DATA:  Elective  surgery. EXAM: DG HIP (WITH OR WITHOUT PELVIS) 2-3V RIGHT COMPARISON:  Preoperative imaging FINDINGS: Six fluoroscopic spot views of the right hip and femur submitted from the operating room. Femoral intramedullary nail with trans trochanteric and distal locking screw fixation traverse proximal femur fracture. Fluoroscopy time 1 minutes 27 seconds. Dose 13.22 mGy. IMPRESSION: Intraoperative fluoroscopy during proximal femur fracture fixation. Electronically Signed   By: Narda Rutherford M.D.   On: 02/17/2023 10:44   DG C-Arm 1-60 Min-No Report Result Date: 02/17/2023 Fluoroscopy was utilized by the requesting physician.  No radiographic interpretation.   DG C-Arm 1-60 Min-No Report Result Date: 02/17/2023 Fluoroscopy was utilized by the requesting physician.  No radiographic interpretation.   Chest Portable 1 View Result Date: 02/16/2023 CLINICAL DATA:  Preop exam.  Fall.  Right femur fracture. EXAM: PORTABLE CHEST 1 VIEW COMPARISON:  Radiograph 06/05/2022 FINDINGS: Similar cardiomegaly. Unchanged mediastinal contours. Aortic atherosclerosis. Left lung base and costophrenic angle are excluded from the field of view. No pulmonary edema or pneumothorax. No evidence of focal airspace disease. No large pleural effusion. Left axillary stent is partially included. On limited assessment, no acute osseous findings. IMPRESSION: Chronic cardiomegaly.  No acute chest findings. Electronically Signed   By: Narda Rutherford M.D.   On: 02/16/2023 22:48   CT Head Wo Contrast Result Date: 02/16/2023 CLINICAL DATA:  fall, distracting injury EXAM: CT HEAD WITHOUT CONTRAST CT CERVICAL SPINE WITHOUT CONTRAST TECHNIQUE: Multidetector CT imaging of the head and cervical spine was performed following the standard protocol without intravenous contrast. Multiplanar CT image reconstructions of the cervical spine were also generated. RADIATION DOSE REDUCTION: This exam was performed according to the departmental dose-optimization  program which includes automated exposure control, adjustment of the mA and/or kV according to patient size and/or use of iterative reconstruction technique. COMPARISON:  None Available. FINDINGS: CT HEAD FINDINGS Brain: Cerebral ventricle sizes are concordant with the degree of cerebral volume loss. Patchy and confluent areas of decreased attenuation are noted throughout the deep and periventricular white matter of the cerebral hemispheres bilaterally, compatible with chronic microvascular ischemic disease. Right temporal encephalomalacia. No evidence of large-territorial acute infarction. No parenchymal hemorrhage. No mass lesion. No extra-axial collection. No mass effect or midline shift. No hydrocephalus. Basilar cisterns are patent. Vascular: No hyperdense vessel. Atherosclerotic calcifications are present within the cavernous internal carotid and vertebral arteries. Skull: No acute fracture or focal lesion.  Prior right burr hole. Sinuses/Orbits: Paranasal sinuses and mastoid air cells are clear. The orbits are unremarkable. Other: None. CT CERVICAL SPINE FINDINGS Alignment: Normal. Skull base and vertebrae: Multilevel degenerative changes of the spine most prominent at the C5-C6 level with associated posterior disc osteophyte complex formation. No severe osseous foraminal or central canal stenosis. No acute fracture. No aggressive appearing focal osseous lesion or focal pathologic process. Soft tissues and spinal canal: No prevertebral fluid or swelling. No visible canal hematoma. Upper chest: Unremarkable. Other: Left neck 3.1 x 1.8 cm fluid dense lesion likely a sebaceous cyst. IMPRESSION: 1. No acute intracranial abnormality. 2. No acute displaced fracture or traumatic listhesis of the cervical spine. Electronically Signed   By: Blanchie Serve  Tessie Fass M.D.   On: 02/16/2023 20:21   CT Cervical Spine Wo Contrast Result Date: 02/16/2023 CLINICAL DATA:  fall, distracting injury EXAM: CT HEAD WITHOUT CONTRAST CT  CERVICAL SPINE WITHOUT CONTRAST TECHNIQUE: Multidetector CT imaging of the head and cervical spine was performed following the standard protocol without intravenous contrast. Multiplanar CT image reconstructions of the cervical spine were also generated. RADIATION DOSE REDUCTION: This exam was performed according to the departmental dose-optimization program which includes automated exposure control, adjustment of the mA and/or kV according to patient size and/or use of iterative reconstruction technique. COMPARISON:  None Available. FINDINGS: CT HEAD FINDINGS Brain: Cerebral ventricle sizes are concordant with the degree of cerebral volume loss. Patchy and confluent areas of decreased attenuation are noted throughout the deep and periventricular white matter of the cerebral hemispheres bilaterally, compatible with chronic microvascular ischemic disease. Right temporal encephalomalacia. No evidence of large-territorial acute infarction. No parenchymal hemorrhage. No mass lesion. No extra-axial collection. No mass effect or midline shift. No hydrocephalus. Basilar cisterns are patent. Vascular: No hyperdense vessel. Atherosclerotic calcifications are present within the cavernous internal carotid and vertebral arteries. Skull: No acute fracture or focal lesion.  Prior right burr hole. Sinuses/Orbits: Paranasal sinuses and mastoid air cells are clear. The orbits are unremarkable. Other: None. CT CERVICAL SPINE FINDINGS Alignment: Normal. Skull base and vertebrae: Multilevel degenerative changes of the spine most prominent at the C5-C6 level with associated posterior disc osteophyte complex formation. No severe osseous foraminal or central canal stenosis. No acute fracture. No aggressive appearing focal osseous lesion or focal pathologic process. Soft tissues and spinal canal: No prevertebral fluid or swelling. No visible canal hematoma. Upper chest: Unremarkable. Other: Left neck 3.1 x 1.8 cm fluid dense lesion likely  a sebaceous cyst. IMPRESSION: 1. No acute intracranial abnormality. 2. No acute displaced fracture or traumatic listhesis of the cervical spine. Electronically Signed   By: Tish Frederickson M.D.   On: 02/16/2023 20:21   DG Femur Min 2 Views Right Result Date: 02/16/2023 CLINICAL DATA:  Pain after fall.  Right hip pain. EXAM: PELVIS - 1-2 VIEW; RIGHT FEMUR 2 VIEWS COMPARISON:  None Available. FINDINGS: Pelvis: Comminuted displaced intertrochanteric right proximal femur fracture. Femoral head is well seated, no hip dislocation. No additional fracture of the pelvis. The iliac crests are excluded from the field of view. Pubic rami are intact. Pubic symphysis and sacroiliac joints are congruent. Femur: Distal femur is intact. No additional distal femur fracture. Moderate right knee osteoarthritis. Vascular calcifications are seen. IMPRESSION: Comminuted displaced intertrochanteric right proximal femur fracture. Electronically Signed   By: Narda Rutherford M.D.   On: 02/16/2023 20:08   DG Pelvis 1-2 Views Result Date: 02/16/2023 CLINICAL DATA:  Pain after fall.  Right hip pain. EXAM: PELVIS - 1-2 VIEW; RIGHT FEMUR 2 VIEWS COMPARISON:  None Available. FINDINGS: Pelvis: Comminuted displaced intertrochanteric right proximal femur fracture. Femoral head is well seated, no hip dislocation. No additional fracture of the pelvis. The iliac crests are excluded from the field of view. Pubic rami are intact. Pubic symphysis and sacroiliac joints are congruent. Femur: Distal femur is intact. No additional distal femur fracture. Moderate right knee osteoarthritis. Vascular calcifications are seen. IMPRESSION: Comminuted displaced intertrochanteric right proximal femur fracture. Electronically Signed   By: Narda Rutherford M.D.   On: 02/16/2023 20:08    Time coordinating discharge: 40 minutes  SIGNED:  Dorcas Carrow, MD [facility] 02/26/23, 10:40 AM

## 2023-02-26 NOTE — TOC Transition Note (Signed)
 Transition of Care [facility]) - Discharge Note   Patient Details  Name: Justin [name] MRN: [id number] Date of Birth: 05-26-1950  [facility]) CM/SW Contact:  Lorri Frederick, LCSW Phone Number: 02/26/2023, 3:21 PM   Clinical Narrative:   Pt discharging to [facility].  RN call report to [phone].      Final next level of care: Skilled Nursing Facility Barriers to Discharge: Barriers Resolved   Patient Goals and CMS Choice Patient states their goals for this hospitalization and ongoing recovery are:: back to normal   Choice offered to / list presented to : Patient      Discharge Placement              Patient chooses bed at: [facility] Patient to be transferred to facility by: ptar Name of family member notified: friend [name] in room Patient and family notified of of transfer: 02/26/23  Discharge Plan and Services Additional resources added to the After Visit Summary for   In-house Referral: Clinical Social Work   Post Acute Care Choice: Skilled Nursing Facility                               Social Drivers of Health (SDOH) Interventions SDOH Screenings   Food Insecurity: No Food Insecurity (02/17/2023)  Housing: Low Risk  (02/17/2023)  Transportation Needs: No Transportation Needs (02/17/2023)  Utilities: Not At Risk (02/17/2023)  Financial Resource Strain: Low Risk  (07/13/2020)   Received from [facility]  Social Connections: Socially Integrated (02/17/2023)  Tobacco Use: Low Risk  (02/17/2023)     Readmission Risk Interventions     No data to display

## 2023-02-26 NOTE — TOC Progression Note (Addendum)
 Transition of Care [facility]) - Progression Note    Patient Details  Name: Justin [name] MRN: [id number] Date of Birth: [birth year]  Transition [facility] Care [facility]) CM/SW Contact  Lorri Frederick, LCSW Phone Number: 02/26/2023, 10:08 AM  Clinical Narrative:   CSW confirmed with [facility] that they can receive pt today.   Tracy/[facility] notified    Expected Discharge Plan: Skilled Nursing Facility Barriers to Discharge: SNF Pending bed offer, Continued Medical Work up  Expected Discharge Plan and Services In-house Referral: Clinical Social Work   Post Acute Care Choice: Skilled Nursing Facility Living arrangements for the past 2 months: Single Family Home                                       Social Determinants of Health (SDOH) Interventions SDOH Screenings   Food Insecurity: No Food Insecurity (02/17/2023)  Housing: Low Risk  (02/17/2023)  Transportation Needs: No Transportation Needs (02/17/2023)  Utilities: Not At Risk (02/17/2023)  Financial Resource Strain: Low Risk  (07/13/2020)   Received from [facility]  Social Connections: Socially Integrated (02/17/2023)  Tobacco Use: Low Risk  (02/17/2023)    Readmission Risk Interventions     No data to display

## 2023-02-26 NOTE — Progress Notes (Addendum)
 [facility] Progress Note   Subjective: Seen on HD. Hopefully he will DC to SNF post HD today. He will be transient pt at [facility] while in SNF.      Objective Vitals:   02/26/23 0425 02/26/23 0723 02/26/23 0908 02/26/23 0945  BP: (!) 164/65 (!) 155/62 (!) 164/58   Pulse: 71 69 71   Resp: 20 17 18    Temp: 98.2 F (36.8 C) 98.3 F (36.8 C) 98.3 F (36.8 C) 98 F (36.7 C)  TempSrc:  Oral Oral Oral  SpO2:  100% 99%   Weight:      Height:       Physical Exam General: Pleasant older male in NAD Heart: S1,S2 3/6 systolic M. No R/G Lungs: CTAB A/P Abdomen: NABS Extremities:Drsg R hip intact. Trace edema RU Thigh. No LLE edema.  Dialysis Access: L AVF + T/B    Additional Objective Labs: Basic Metabolic Panel: Recent Labs  Lab 02/24/23 0449  NA 134*  K 4.3  CL 92*  CO2 23  GLUCOSE 196*  BUN 83*  CREATININE 9.63*  CALCIUM 8.4*  PHOS 7.0*   Liver Function Tests: Recent Labs  Lab 02/24/23 0449  ALBUMIN 2.6*   No results for input(s): "LIPASE", "AMYLASE" in the last 168 hours. CBC: Recent Labs  Lab 02/20/23 0847 02/24/23 0449  WBC 9.8 8.2  NEUTROABS 7.4 5.7  HGB 8.2* 7.7*  HCT 22.8* 22.0*  MCV 87.4 87.6  PLT 150 271   Blood Culture    Component Value Date/Time   SDES BLOOD RIGHT ARM 06/05/2022 1759   SPECREQUEST  06/05/2022 1759    BOTTLES DRAWN AEROBIC AND ANAEROBIC Blood Culture results may not be optimal due to an excessive volume of blood received in culture bottles   CULT  06/05/2022 1759    NO GROWTH 5 DAYS Performed at [facility], [address] [place], [place] [address]    REPTSTATUS 06/10/2022 FINAL 06/05/2022 1759    Cardiac Enzymes: No results for input(s): "CKTOTAL", "CKMB", "CKMBINDEX", "TROPONINI" in the last 168 hours. CBG: Recent Labs  Lab 02/25/23 0626 02/25/23 1103 02/25/23 1609 02/25/23 2024 02/26/23 0534  GLUCAP 136* 237* 224* 238* 245*   Iron Studies:  Recent Labs    02/24/23 0449   IRON 57  TIBC 171*  FERRITIN 5,822*   @lablastinr3 @ Studies/Results: No results found. Medications:   amLODipine  10 mg Oral Daily   Chlorhexidine Gluconate Cloth  6 each Topical Q0600   darbepoetin (ARANESP) injection - DIALYSIS  100 mcg Subcutaneous Q Sat-1800   docusate sodium  100 mg Oral BID   feeding supplement (NEPRO CARB STEADY)  237 mL Oral TID BM   ferric citrate  630 mg Oral TID WC   insulin aspart  0-5 Units Subcutaneous QHS   insulin aspart  0-6 Units Subcutaneous TID WC   insulin aspart  3 Units Subcutaneous TID WC   insulin glargine-yfgn  12 Units Subcutaneous QHS   isosorbide mononitrate  30 mg Oral Once per day on Sunday Tuesday Thursday Saturday   lactulose  20 g Oral BID   melatonin  10 mg Oral QHS   methocarbamol  500 mg Oral Q12H   metoprolol succinate  50 mg Oral Daily   multivitamin  1 tablet Oral QHS   pantoprazole  40 mg Oral BID   rosuvastatin  40 mg Oral Daily     Dialysis Orders: TTS [place] 4h  B450  81.1kg   2K bath  L  AVF  Heparin none  - last HD 2/13, post wt 81.6kg, gets to dry wt, wt gain ~ 3kg - hectorol 2 mcg IV  - no esa - last Hb 10.5, ferritin 1605, pth 222    Renal-related home meds: - ferric citrate 3 ac tid - hydralazine 50 tid  - norvasc 10 - metoprolol xl 50 every day - others: imdur, sl ntg prn, statin, novolin insulin, asa   Assessment/Plan: R hip fracture - after a fall. S/P medullary nail procedure done today 2/15 by orthopedic surgery. PT now working with patient. Globus sensation - had an abnormal barium swallow 2/21. GI consulted and s/p EGD 2/22 which showed esophagitis, now on PPI BID.  ESRD - original HD schedule was TTS. Patient now will be transferred to [facility] MWF while he is receiving rehab. Next HD today (2/24) AM and likely will be discharged afterwards.  HTN - BP's currently acceptable. Continue current medications Volume - Euvolemic on exam. UF as tolerated. Anemia of esrd -  Last Hgb 7.7  02/24/2023 S/P 2 units PRBCS Follow labs.  Secondary hyperparathyroidism - Ca and phos in range but alb is low, on Nepro. Cont binders w/ meals and IV vdra.  Dispo - Plan for SNF placement for rehab. He will get HD at [facility] M[facility].   Rita H. Brown NP-C 02/26/2023, 9:54 AM  [employer] [phone]     Seen and examined independently.  Agree with note and exam as documented above by physician extender and as noted here.  Seen and examined on dialysis.  Blood pressure 106/46 and HR 68.  Procedure supervised. Tolerating goal.  Left AVF in use.  Hard to get comfortable.   Anemia - improved s/p PRBC's  Noted discharge order in place  Estanislado Emms, MD 02/26/2023  12:47 PM

## 2023-02-27 ENCOUNTER — Encounter (HOSPITAL_COMMUNITY): Payer: Self-pay | Admitting: Internal Medicine

## 2023-02-27 LAB — TYPE AND SCREEN
ABO/RH(D): O POS
Antibody Screen: NEGATIVE
Unit division: 0
Unit division: 0

## 2023-02-27 LAB — BPAM RBC
Blood Product Expiration Date: 202503132359
Blood Product Unit Number: 202502262359
ISSUE DATE / TIME: 202502240016
PRODUCT CODE: 202502240402
PRODUCT CODE: 202503132359
Unit Type and Rh: 202502262359
Unit Type and Rh: 5100
Unit Type and Rh: 5100
Unit Type and Rh: 5100

## 2023-02-28 DIAGNOSIS — Z992 Dependence on renal dialysis: Secondary | ICD-10-CM | POA: Diagnosis not present

## 2023-02-28 DIAGNOSIS — N186 End stage renal disease: Secondary | ICD-10-CM | POA: Diagnosis not present

## 2023-03-01 ENCOUNTER — Encounter: Payer: Self-pay | Admitting: Internal Medicine

## 2023-03-01 LAB — SURGICAL PATHOLOGY

## 2023-03-02 DIAGNOSIS — D62 Acute posthemorrhagic anemia: Secondary | ICD-10-CM | POA: Diagnosis not present

## 2023-03-02 DIAGNOSIS — N186 End stage renal disease: Secondary | ICD-10-CM | POA: Diagnosis not present

## 2023-03-02 DIAGNOSIS — W19XXXD Unspecified fall, subsequent encounter: Secondary | ICD-10-CM | POA: Diagnosis not present

## 2023-03-02 DIAGNOSIS — I2510 Atherosclerotic heart disease of native coronary artery without angina pectoris: Secondary | ICD-10-CM | POA: Diagnosis not present

## 2023-03-02 DIAGNOSIS — E1129 Type 2 diabetes mellitus with other diabetic kidney complication: Secondary | ICD-10-CM | POA: Diagnosis not present

## 2023-03-02 DIAGNOSIS — E1122 Type 2 diabetes mellitus with diabetic chronic kidney disease: Secondary | ICD-10-CM | POA: Diagnosis not present

## 2023-03-02 DIAGNOSIS — E785 Hyperlipidemia, unspecified: Secondary | ICD-10-CM | POA: Diagnosis not present

## 2023-03-02 DIAGNOSIS — I1311 Hypertensive heart and chronic kidney disease without heart failure, with stage 5 chronic kidney disease, or end stage renal disease: Secondary | ICD-10-CM | POA: Diagnosis not present

## 2023-03-02 DIAGNOSIS — S72141D Displaced intertrochanteric fracture of right femur, subsequent encounter for closed fracture with routine healing: Secondary | ICD-10-CM | POA: Diagnosis not present

## 2023-03-02 DIAGNOSIS — Z992 Dependence on renal dialysis: Secondary | ICD-10-CM | POA: Diagnosis not present

## 2023-03-02 DIAGNOSIS — I350 Nonrheumatic aortic (valve) stenosis: Secondary | ICD-10-CM | POA: Diagnosis not present

## 2023-03-05 DIAGNOSIS — I1311 Hypertensive heart and chronic kidney disease without heart failure, with stage 5 chronic kidney disease, or end stage renal disease: Secondary | ICD-10-CM | POA: Diagnosis not present

## 2023-03-05 DIAGNOSIS — I2510 Atherosclerotic heart disease of native coronary artery without angina pectoris: Secondary | ICD-10-CM | POA: Diagnosis not present

## 2023-03-05 DIAGNOSIS — N186 End stage renal disease: Secondary | ICD-10-CM | POA: Diagnosis not present

## 2023-03-05 DIAGNOSIS — S72141D Displaced intertrochanteric fracture of right femur, subsequent encounter for closed fracture with routine healing: Secondary | ICD-10-CM | POA: Diagnosis not present

## 2023-03-05 DIAGNOSIS — Z794 Long term (current) use of insulin: Secondary | ICD-10-CM | POA: Diagnosis not present

## 2023-03-05 DIAGNOSIS — W19XXXD Unspecified fall, subsequent encounter: Secondary | ICD-10-CM | POA: Diagnosis not present

## 2023-03-05 DIAGNOSIS — Z992 Dependence on renal dialysis: Secondary | ICD-10-CM | POA: Diagnosis not present

## 2023-03-05 DIAGNOSIS — D62 Acute posthemorrhagic anemia: Secondary | ICD-10-CM | POA: Diagnosis not present

## 2023-03-05 DIAGNOSIS — E1122 Type 2 diabetes mellitus with diabetic chronic kidney disease: Secondary | ICD-10-CM | POA: Diagnosis not present

## 2023-03-05 DIAGNOSIS — G4700 Insomnia, unspecified: Secondary | ICD-10-CM | POA: Diagnosis not present

## 2023-03-06 DIAGNOSIS — S72141D Displaced intertrochanteric fracture of right femur, subsequent encounter for closed fracture with routine healing: Secondary | ICD-10-CM | POA: Diagnosis not present

## 2023-03-06 DIAGNOSIS — Z794 Long term (current) use of insulin: Secondary | ICD-10-CM | POA: Diagnosis not present

## 2023-03-06 DIAGNOSIS — I1311 Hypertensive heart and chronic kidney disease without heart failure, with stage 5 chronic kidney disease, or end stage renal disease: Secondary | ICD-10-CM | POA: Diagnosis not present

## 2023-03-06 DIAGNOSIS — I2510 Atherosclerotic heart disease of native coronary artery without angina pectoris: Secondary | ICD-10-CM | POA: Diagnosis not present

## 2023-03-06 DIAGNOSIS — G4700 Insomnia, unspecified: Secondary | ICD-10-CM | POA: Diagnosis not present

## 2023-03-06 DIAGNOSIS — E1122 Type 2 diabetes mellitus with diabetic chronic kidney disease: Secondary | ICD-10-CM | POA: Diagnosis not present

## 2023-03-06 DIAGNOSIS — N186 End stage renal disease: Secondary | ICD-10-CM | POA: Diagnosis not present

## 2023-03-06 DIAGNOSIS — D62 Acute posthemorrhagic anemia: Secondary | ICD-10-CM | POA: Diagnosis not present

## 2023-03-06 DIAGNOSIS — Z992 Dependence on renal dialysis: Secondary | ICD-10-CM | POA: Diagnosis not present

## 2023-03-07 DIAGNOSIS — I2510 Atherosclerotic heart disease of native coronary artery without angina pectoris: Secondary | ICD-10-CM | POA: Diagnosis not present

## 2023-03-07 DIAGNOSIS — S72141D Displaced intertrochanteric fracture of right femur, subsequent encounter for closed fracture with routine healing: Secondary | ICD-10-CM | POA: Diagnosis not present

## 2023-03-07 DIAGNOSIS — E1122 Type 2 diabetes mellitus with diabetic chronic kidney disease: Secondary | ICD-10-CM | POA: Diagnosis not present

## 2023-03-07 DIAGNOSIS — Z794 Long term (current) use of insulin: Secondary | ICD-10-CM | POA: Diagnosis not present

## 2023-03-07 DIAGNOSIS — G4700 Insomnia, unspecified: Secondary | ICD-10-CM | POA: Diagnosis not present

## 2023-03-07 DIAGNOSIS — I1311 Hypertensive heart and chronic kidney disease without heart failure, with stage 5 chronic kidney disease, or end stage renal disease: Secondary | ICD-10-CM | POA: Diagnosis not present

## 2023-03-07 DIAGNOSIS — N186 End stage renal disease: Secondary | ICD-10-CM | POA: Diagnosis not present

## 2023-03-07 DIAGNOSIS — D62 Acute posthemorrhagic anemia: Secondary | ICD-10-CM | POA: Diagnosis not present

## 2023-03-07 DIAGNOSIS — Z992 Dependence on renal dialysis: Secondary | ICD-10-CM | POA: Diagnosis not present

## 2023-03-08 DIAGNOSIS — S72141D Displaced intertrochanteric fracture of right femur, subsequent encounter for closed fracture with routine healing: Secondary | ICD-10-CM | POA: Diagnosis not present

## 2023-03-09 DIAGNOSIS — G4700 Insomnia, unspecified: Secondary | ICD-10-CM | POA: Diagnosis not present

## 2023-03-09 DIAGNOSIS — E1122 Type 2 diabetes mellitus with diabetic chronic kidney disease: Secondary | ICD-10-CM | POA: Diagnosis not present

## 2023-03-09 DIAGNOSIS — Z794 Long term (current) use of insulin: Secondary | ICD-10-CM | POA: Diagnosis not present

## 2023-03-09 DIAGNOSIS — S72141D Displaced intertrochanteric fracture of right femur, subsequent encounter for closed fracture with routine healing: Secondary | ICD-10-CM | POA: Diagnosis not present

## 2023-03-09 DIAGNOSIS — I2510 Atherosclerotic heart disease of native coronary artery without angina pectoris: Secondary | ICD-10-CM | POA: Diagnosis not present

## 2023-03-09 DIAGNOSIS — D62 Acute posthemorrhagic anemia: Secondary | ICD-10-CM | POA: Diagnosis not present

## 2023-03-09 DIAGNOSIS — N186 End stage renal disease: Secondary | ICD-10-CM | POA: Diagnosis not present

## 2023-03-09 DIAGNOSIS — Z992 Dependence on renal dialysis: Secondary | ICD-10-CM | POA: Diagnosis not present

## 2023-03-09 DIAGNOSIS — I1311 Hypertensive heart and chronic kidney disease without heart failure, with stage 5 chronic kidney disease, or end stage renal disease: Secondary | ICD-10-CM | POA: Diagnosis not present

## 2023-03-12 DIAGNOSIS — N186 End stage renal disease: Secondary | ICD-10-CM | POA: Diagnosis not present

## 2023-03-12 DIAGNOSIS — Z992 Dependence on renal dialysis: Secondary | ICD-10-CM | POA: Diagnosis not present

## 2023-03-14 ENCOUNTER — Telehealth: Payer: Self-pay

## 2023-03-14 DIAGNOSIS — S72141D Displaced intertrochanteric fracture of right femur, subsequent encounter for closed fracture with routine healing: Secondary | ICD-10-CM | POA: Diagnosis not present

## 2023-03-14 DIAGNOSIS — Z992 Dependence on renal dialysis: Secondary | ICD-10-CM | POA: Diagnosis not present

## 2023-03-14 DIAGNOSIS — N186 End stage renal disease: Secondary | ICD-10-CM | POA: Diagnosis not present

## 2023-03-14 DIAGNOSIS — G4700 Insomnia, unspecified: Secondary | ICD-10-CM | POA: Diagnosis not present

## 2023-03-14 DIAGNOSIS — D62 Acute posthemorrhagic anemia: Secondary | ICD-10-CM | POA: Diagnosis not present

## 2023-03-14 DIAGNOSIS — I1311 Hypertensive heart and chronic kidney disease without heart failure, with stage 5 chronic kidney disease, or end stage renal disease: Secondary | ICD-10-CM | POA: Diagnosis not present

## 2023-03-14 DIAGNOSIS — Z794 Long term (current) use of insulin: Secondary | ICD-10-CM | POA: Diagnosis not present

## 2023-03-14 DIAGNOSIS — I2510 Atherosclerotic heart disease of native coronary artery without angina pectoris: Secondary | ICD-10-CM | POA: Diagnosis not present

## 2023-03-14 DIAGNOSIS — E1122 Type 2 diabetes mellitus with diabetic chronic kidney disease: Secondary | ICD-10-CM | POA: Diagnosis not present

## 2023-03-14 NOTE — Telephone Encounter (Signed)
 Called patient to schedule a follow up visit in office no answer left message on voice mail to call office back

## 2023-03-16 DIAGNOSIS — I1311 Hypertensive heart and chronic kidney disease without heart failure, with stage 5 chronic kidney disease, or end stage renal disease: Secondary | ICD-10-CM | POA: Diagnosis not present

## 2023-03-16 DIAGNOSIS — G4700 Insomnia, unspecified: Secondary | ICD-10-CM | POA: Diagnosis not present

## 2023-03-16 DIAGNOSIS — D62 Acute posthemorrhagic anemia: Secondary | ICD-10-CM | POA: Diagnosis not present

## 2023-03-16 DIAGNOSIS — Z992 Dependence on renal dialysis: Secondary | ICD-10-CM | POA: Diagnosis not present

## 2023-03-16 DIAGNOSIS — I2510 Atherosclerotic heart disease of native coronary artery without angina pectoris: Secondary | ICD-10-CM | POA: Diagnosis not present

## 2023-03-16 DIAGNOSIS — S72141D Displaced intertrochanteric fracture of right femur, subsequent encounter for closed fracture with routine healing: Secondary | ICD-10-CM | POA: Diagnosis not present

## 2023-03-16 DIAGNOSIS — N186 End stage renal disease: Secondary | ICD-10-CM | POA: Diagnosis not present

## 2023-03-16 DIAGNOSIS — E1122 Type 2 diabetes mellitus with diabetic chronic kidney disease: Secondary | ICD-10-CM | POA: Diagnosis not present

## 2023-03-16 DIAGNOSIS — Z794 Long term (current) use of insulin: Secondary | ICD-10-CM | POA: Diagnosis not present

## 2023-03-19 DIAGNOSIS — N186 End stage renal disease: Secondary | ICD-10-CM | POA: Diagnosis not present

## 2023-03-19 DIAGNOSIS — E785 Hyperlipidemia, unspecified: Secondary | ICD-10-CM | POA: Diagnosis not present

## 2023-03-19 DIAGNOSIS — I2510 Atherosclerotic heart disease of native coronary artery without angina pectoris: Secondary | ICD-10-CM | POA: Diagnosis not present

## 2023-03-19 DIAGNOSIS — I120 Hypertensive chronic kidney disease with stage 5 chronic kidney disease or end stage renal disease: Secondary | ICD-10-CM | POA: Diagnosis not present

## 2023-03-19 DIAGNOSIS — I350 Nonrheumatic aortic (valve) stenosis: Secondary | ICD-10-CM | POA: Diagnosis not present

## 2023-03-19 DIAGNOSIS — S72141D Displaced intertrochanteric fracture of right femur, subsequent encounter for closed fracture with routine healing: Secondary | ICD-10-CM | POA: Diagnosis not present

## 2023-03-19 DIAGNOSIS — E1122 Type 2 diabetes mellitus with diabetic chronic kidney disease: Secondary | ICD-10-CM | POA: Diagnosis not present

## 2023-03-19 DIAGNOSIS — D631 Anemia in chronic kidney disease: Secondary | ICD-10-CM | POA: Diagnosis not present

## 2023-03-19 DIAGNOSIS — Z992 Dependence on renal dialysis: Secondary | ICD-10-CM | POA: Diagnosis not present

## 2023-03-20 DIAGNOSIS — Z992 Dependence on renal dialysis: Secondary | ICD-10-CM | POA: Diagnosis not present

## 2023-03-20 DIAGNOSIS — N186 End stage renal disease: Secondary | ICD-10-CM | POA: Diagnosis not present

## 2023-03-20 DIAGNOSIS — N2581 Secondary hyperparathyroidism of renal origin: Secondary | ICD-10-CM | POA: Diagnosis not present

## 2023-03-21 DIAGNOSIS — S72141D Displaced intertrochanteric fracture of right femur, subsequent encounter for closed fracture with routine healing: Secondary | ICD-10-CM | POA: Diagnosis not present

## 2023-03-21 DIAGNOSIS — E785 Hyperlipidemia, unspecified: Secondary | ICD-10-CM | POA: Diagnosis not present

## 2023-03-21 DIAGNOSIS — I120 Hypertensive chronic kidney disease with stage 5 chronic kidney disease or end stage renal disease: Secondary | ICD-10-CM | POA: Diagnosis not present

## 2023-03-21 DIAGNOSIS — I350 Nonrheumatic aortic (valve) stenosis: Secondary | ICD-10-CM | POA: Diagnosis not present

## 2023-03-21 DIAGNOSIS — N186 End stage renal disease: Secondary | ICD-10-CM | POA: Diagnosis not present

## 2023-03-21 DIAGNOSIS — Z992 Dependence on renal dialysis: Secondary | ICD-10-CM | POA: Diagnosis not present

## 2023-03-21 DIAGNOSIS — E1122 Type 2 diabetes mellitus with diabetic chronic kidney disease: Secondary | ICD-10-CM | POA: Diagnosis not present

## 2023-03-21 DIAGNOSIS — I2510 Atherosclerotic heart disease of native coronary artery without angina pectoris: Secondary | ICD-10-CM | POA: Diagnosis not present

## 2023-03-21 DIAGNOSIS — D631 Anemia in chronic kidney disease: Secondary | ICD-10-CM | POA: Diagnosis not present

## 2023-03-22 DIAGNOSIS — Z992 Dependence on renal dialysis: Secondary | ICD-10-CM | POA: Diagnosis not present

## 2023-03-22 DIAGNOSIS — N186 End stage renal disease: Secondary | ICD-10-CM | POA: Diagnosis not present

## 2023-03-22 DIAGNOSIS — N2581 Secondary hyperparathyroidism of renal origin: Secondary | ICD-10-CM | POA: Diagnosis not present

## 2023-03-23 DIAGNOSIS — R0981 Nasal congestion: Secondary | ICD-10-CM | POA: Diagnosis not present

## 2023-03-23 DIAGNOSIS — E1129 Type 2 diabetes mellitus with other diabetic kidney complication: Secondary | ICD-10-CM | POA: Diagnosis not present

## 2023-03-23 DIAGNOSIS — I120 Hypertensive chronic kidney disease with stage 5 chronic kidney disease or end stage renal disease: Secondary | ICD-10-CM | POA: Diagnosis not present

## 2023-03-23 DIAGNOSIS — N186 End stage renal disease: Secondary | ICD-10-CM | POA: Diagnosis not present

## 2023-03-23 DIAGNOSIS — Z8781 Personal history of (healed) traumatic fracture: Secondary | ICD-10-CM | POA: Diagnosis not present

## 2023-03-24 DIAGNOSIS — Z992 Dependence on renal dialysis: Secondary | ICD-10-CM | POA: Diagnosis not present

## 2023-03-24 DIAGNOSIS — N2581 Secondary hyperparathyroidism of renal origin: Secondary | ICD-10-CM | POA: Diagnosis not present

## 2023-03-24 DIAGNOSIS — N186 End stage renal disease: Secondary | ICD-10-CM | POA: Diagnosis not present

## 2023-03-26 DIAGNOSIS — E1122 Type 2 diabetes mellitus with diabetic chronic kidney disease: Secondary | ICD-10-CM | POA: Diagnosis not present

## 2023-03-26 DIAGNOSIS — N186 End stage renal disease: Secondary | ICD-10-CM | POA: Diagnosis not present

## 2023-03-26 DIAGNOSIS — D631 Anemia in chronic kidney disease: Secondary | ICD-10-CM | POA: Diagnosis not present

## 2023-03-26 DIAGNOSIS — S72141D Displaced intertrochanteric fracture of right femur, subsequent encounter for closed fracture with routine healing: Secondary | ICD-10-CM | POA: Diagnosis not present

## 2023-03-26 DIAGNOSIS — I2510 Atherosclerotic heart disease of native coronary artery without angina pectoris: Secondary | ICD-10-CM | POA: Diagnosis not present

## 2023-03-26 DIAGNOSIS — I350 Nonrheumatic aortic (valve) stenosis: Secondary | ICD-10-CM | POA: Diagnosis not present

## 2023-03-26 DIAGNOSIS — I120 Hypertensive chronic kidney disease with stage 5 chronic kidney disease or end stage renal disease: Secondary | ICD-10-CM | POA: Diagnosis not present

## 2023-03-26 DIAGNOSIS — E785 Hyperlipidemia, unspecified: Secondary | ICD-10-CM | POA: Diagnosis not present

## 2023-03-26 DIAGNOSIS — Z992 Dependence on renal dialysis: Secondary | ICD-10-CM | POA: Diagnosis not present

## 2023-03-27 DIAGNOSIS — N2581 Secondary hyperparathyroidism of renal origin: Secondary | ICD-10-CM | POA: Diagnosis not present

## 2023-03-27 DIAGNOSIS — Z992 Dependence on renal dialysis: Secondary | ICD-10-CM | POA: Diagnosis not present

## 2023-03-27 DIAGNOSIS — N186 End stage renal disease: Secondary | ICD-10-CM | POA: Diagnosis not present

## 2023-03-28 DIAGNOSIS — E1122 Type 2 diabetes mellitus with diabetic chronic kidney disease: Secondary | ICD-10-CM | POA: Diagnosis not present

## 2023-03-28 DIAGNOSIS — N186 End stage renal disease: Secondary | ICD-10-CM | POA: Diagnosis not present

## 2023-03-28 DIAGNOSIS — I2510 Atherosclerotic heart disease of native coronary artery without angina pectoris: Secondary | ICD-10-CM | POA: Diagnosis not present

## 2023-03-28 DIAGNOSIS — I120 Hypertensive chronic kidney disease with stage 5 chronic kidney disease or end stage renal disease: Secondary | ICD-10-CM | POA: Diagnosis not present

## 2023-03-28 DIAGNOSIS — Z992 Dependence on renal dialysis: Secondary | ICD-10-CM | POA: Diagnosis not present

## 2023-03-28 DIAGNOSIS — S72141D Displaced intertrochanteric fracture of right femur, subsequent encounter for closed fracture with routine healing: Secondary | ICD-10-CM | POA: Diagnosis not present

## 2023-03-28 DIAGNOSIS — D631 Anemia in chronic kidney disease: Secondary | ICD-10-CM | POA: Diagnosis not present

## 2023-03-28 DIAGNOSIS — E785 Hyperlipidemia, unspecified: Secondary | ICD-10-CM | POA: Diagnosis not present

## 2023-03-28 DIAGNOSIS — I350 Nonrheumatic aortic (valve) stenosis: Secondary | ICD-10-CM | POA: Diagnosis not present

## 2023-03-29 DIAGNOSIS — N2581 Secondary hyperparathyroidism of renal origin: Secondary | ICD-10-CM | POA: Diagnosis not present

## 2023-03-29 DIAGNOSIS — Z992 Dependence on renal dialysis: Secondary | ICD-10-CM | POA: Diagnosis not present

## 2023-03-29 DIAGNOSIS — N186 End stage renal disease: Secondary | ICD-10-CM | POA: Diagnosis not present

## 2023-03-30 DIAGNOSIS — E785 Hyperlipidemia, unspecified: Secondary | ICD-10-CM | POA: Diagnosis not present

## 2023-03-30 DIAGNOSIS — N186 End stage renal disease: Secondary | ICD-10-CM | POA: Diagnosis not present

## 2023-03-30 DIAGNOSIS — Z992 Dependence on renal dialysis: Secondary | ICD-10-CM | POA: Diagnosis not present

## 2023-03-30 DIAGNOSIS — I2510 Atherosclerotic heart disease of native coronary artery without angina pectoris: Secondary | ICD-10-CM | POA: Diagnosis not present

## 2023-03-30 DIAGNOSIS — S72141D Displaced intertrochanteric fracture of right femur, subsequent encounter for closed fracture with routine healing: Secondary | ICD-10-CM | POA: Diagnosis not present

## 2023-03-30 DIAGNOSIS — I350 Nonrheumatic aortic (valve) stenosis: Secondary | ICD-10-CM | POA: Diagnosis not present

## 2023-03-30 DIAGNOSIS — D631 Anemia in chronic kidney disease: Secondary | ICD-10-CM | POA: Diagnosis not present

## 2023-03-30 DIAGNOSIS — E1122 Type 2 diabetes mellitus with diabetic chronic kidney disease: Secondary | ICD-10-CM | POA: Diagnosis not present

## 2023-03-30 DIAGNOSIS — I120 Hypertensive chronic kidney disease with stage 5 chronic kidney disease or end stage renal disease: Secondary | ICD-10-CM | POA: Diagnosis not present

## 2023-03-31 DIAGNOSIS — Z992 Dependence on renal dialysis: Secondary | ICD-10-CM | POA: Diagnosis not present

## 2023-03-31 DIAGNOSIS — N2581 Secondary hyperparathyroidism of renal origin: Secondary | ICD-10-CM | POA: Diagnosis not present

## 2023-03-31 DIAGNOSIS — N186 End stage renal disease: Secondary | ICD-10-CM | POA: Diagnosis not present

## 2023-04-02 DIAGNOSIS — I350 Nonrheumatic aortic (valve) stenosis: Secondary | ICD-10-CM | POA: Diagnosis not present

## 2023-04-02 DIAGNOSIS — D631 Anemia in chronic kidney disease: Secondary | ICD-10-CM | POA: Diagnosis not present

## 2023-04-02 DIAGNOSIS — S72141D Displaced intertrochanteric fracture of right femur, subsequent encounter for closed fracture with routine healing: Secondary | ICD-10-CM | POA: Diagnosis not present

## 2023-04-02 DIAGNOSIS — E785 Hyperlipidemia, unspecified: Secondary | ICD-10-CM | POA: Diagnosis not present

## 2023-04-02 DIAGNOSIS — E1122 Type 2 diabetes mellitus with diabetic chronic kidney disease: Secondary | ICD-10-CM | POA: Diagnosis not present

## 2023-04-02 DIAGNOSIS — I120 Hypertensive chronic kidney disease with stage 5 chronic kidney disease or end stage renal disease: Secondary | ICD-10-CM | POA: Diagnosis not present

## 2023-04-02 DIAGNOSIS — E1129 Type 2 diabetes mellitus with other diabetic kidney complication: Secondary | ICD-10-CM | POA: Diagnosis not present

## 2023-04-02 DIAGNOSIS — I2510 Atherosclerotic heart disease of native coronary artery without angina pectoris: Secondary | ICD-10-CM | POA: Diagnosis not present

## 2023-04-02 DIAGNOSIS — N186 End stage renal disease: Secondary | ICD-10-CM | POA: Diagnosis not present

## 2023-04-02 DIAGNOSIS — Z992 Dependence on renal dialysis: Secondary | ICD-10-CM | POA: Diagnosis not present

## 2023-04-03 DIAGNOSIS — Z992 Dependence on renal dialysis: Secondary | ICD-10-CM | POA: Diagnosis not present

## 2023-04-03 DIAGNOSIS — N2581 Secondary hyperparathyroidism of renal origin: Secondary | ICD-10-CM | POA: Diagnosis not present

## 2023-04-03 DIAGNOSIS — N186 End stage renal disease: Secondary | ICD-10-CM | POA: Diagnosis not present

## 2023-04-05 DIAGNOSIS — N186 End stage renal disease: Secondary | ICD-10-CM | POA: Diagnosis not present

## 2023-04-05 DIAGNOSIS — N2581 Secondary hyperparathyroidism of renal origin: Secondary | ICD-10-CM | POA: Diagnosis not present

## 2023-04-05 DIAGNOSIS — Z992 Dependence on renal dialysis: Secondary | ICD-10-CM | POA: Diagnosis not present

## 2023-04-06 DIAGNOSIS — I2510 Atherosclerotic heart disease of native coronary artery without angina pectoris: Secondary | ICD-10-CM | POA: Diagnosis not present

## 2023-04-06 DIAGNOSIS — D631 Anemia in chronic kidney disease: Secondary | ICD-10-CM | POA: Diagnosis not present

## 2023-04-06 DIAGNOSIS — E1122 Type 2 diabetes mellitus with diabetic chronic kidney disease: Secondary | ICD-10-CM | POA: Diagnosis not present

## 2023-04-06 DIAGNOSIS — S72141D Displaced intertrochanteric fracture of right femur, subsequent encounter for closed fracture with routine healing: Secondary | ICD-10-CM | POA: Diagnosis not present

## 2023-04-06 DIAGNOSIS — N186 End stage renal disease: Secondary | ICD-10-CM | POA: Diagnosis not present

## 2023-04-06 DIAGNOSIS — I120 Hypertensive chronic kidney disease with stage 5 chronic kidney disease or end stage renal disease: Secondary | ICD-10-CM | POA: Diagnosis not present

## 2023-04-06 DIAGNOSIS — Z992 Dependence on renal dialysis: Secondary | ICD-10-CM | POA: Diagnosis not present

## 2023-04-06 DIAGNOSIS — E785 Hyperlipidemia, unspecified: Secondary | ICD-10-CM | POA: Diagnosis not present

## 2023-04-06 DIAGNOSIS — I350 Nonrheumatic aortic (valve) stenosis: Secondary | ICD-10-CM | POA: Diagnosis not present

## 2023-04-07 DIAGNOSIS — Z992 Dependence on renal dialysis: Secondary | ICD-10-CM | POA: Diagnosis not present

## 2023-04-07 DIAGNOSIS — N2581 Secondary hyperparathyroidism of renal origin: Secondary | ICD-10-CM | POA: Diagnosis not present

## 2023-04-07 DIAGNOSIS — N186 End stage renal disease: Secondary | ICD-10-CM | POA: Diagnosis not present

## 2023-04-10 DIAGNOSIS — Z992 Dependence on renal dialysis: Secondary | ICD-10-CM | POA: Diagnosis not present

## 2023-04-10 DIAGNOSIS — N186 End stage renal disease: Secondary | ICD-10-CM | POA: Diagnosis not present

## 2023-04-10 DIAGNOSIS — N2581 Secondary hyperparathyroidism of renal origin: Secondary | ICD-10-CM | POA: Diagnosis not present

## 2023-04-11 DIAGNOSIS — Z992 Dependence on renal dialysis: Secondary | ICD-10-CM | POA: Diagnosis not present

## 2023-04-11 DIAGNOSIS — D631 Anemia in chronic kidney disease: Secondary | ICD-10-CM | POA: Diagnosis not present

## 2023-04-11 DIAGNOSIS — S72141D Displaced intertrochanteric fracture of right femur, subsequent encounter for closed fracture with routine healing: Secondary | ICD-10-CM | POA: Diagnosis not present

## 2023-04-11 DIAGNOSIS — I350 Nonrheumatic aortic (valve) stenosis: Secondary | ICD-10-CM | POA: Diagnosis not present

## 2023-04-11 DIAGNOSIS — I120 Hypertensive chronic kidney disease with stage 5 chronic kidney disease or end stage renal disease: Secondary | ICD-10-CM | POA: Diagnosis not present

## 2023-04-11 DIAGNOSIS — N186 End stage renal disease: Secondary | ICD-10-CM | POA: Diagnosis not present

## 2023-04-11 DIAGNOSIS — E1122 Type 2 diabetes mellitus with diabetic chronic kidney disease: Secondary | ICD-10-CM | POA: Diagnosis not present

## 2023-04-11 DIAGNOSIS — E785 Hyperlipidemia, unspecified: Secondary | ICD-10-CM | POA: Diagnosis not present

## 2023-04-11 DIAGNOSIS — I2510 Atherosclerotic heart disease of native coronary artery without angina pectoris: Secondary | ICD-10-CM | POA: Diagnosis not present

## 2023-04-12 DIAGNOSIS — N186 End stage renal disease: Secondary | ICD-10-CM | POA: Diagnosis not present

## 2023-04-12 DIAGNOSIS — Z992 Dependence on renal dialysis: Secondary | ICD-10-CM | POA: Diagnosis not present

## 2023-04-12 DIAGNOSIS — N2581 Secondary hyperparathyroidism of renal origin: Secondary | ICD-10-CM | POA: Diagnosis not present

## 2023-04-14 DIAGNOSIS — N186 End stage renal disease: Secondary | ICD-10-CM | POA: Diagnosis not present

## 2023-04-14 DIAGNOSIS — Z992 Dependence on renal dialysis: Secondary | ICD-10-CM | POA: Diagnosis not present

## 2023-04-14 DIAGNOSIS — N2581 Secondary hyperparathyroidism of renal origin: Secondary | ICD-10-CM | POA: Diagnosis not present

## 2023-04-16 ENCOUNTER — Encounter (HOSPITAL_COMMUNITY): Payer: Self-pay | Admitting: Internal Medicine

## 2023-04-16 ENCOUNTER — Ambulatory Visit (HOSPITAL_COMMUNITY)
Admission: RE | Admit: 2023-04-16 | Discharge: 2023-04-16 | Disposition: A | Discharge status: Home / Self Care | Attending: Internal Medicine | Admitting: Internal Medicine

## 2023-04-16 ENCOUNTER — Other Ambulatory Visit: Payer: Self-pay

## 2023-04-16 ENCOUNTER — Encounter (HOSPITAL_COMMUNITY)
Admission: RE | Disposition: A | Discharge status: Home / Self Care | Payer: Self-pay | Source: Home / Self Care | Attending: Internal Medicine

## 2023-04-16 DIAGNOSIS — Z833 Family history of diabetes mellitus: Secondary | ICD-10-CM | POA: Diagnosis not present

## 2023-04-16 DIAGNOSIS — I132 Hypertensive heart and chronic kidney disease with heart failure and with stage 5 chronic kidney disease, or end stage renal disease: Secondary | ICD-10-CM | POA: Insufficient documentation

## 2023-04-16 DIAGNOSIS — I871 Compression of vein: Secondary | ICD-10-CM | POA: Diagnosis not present

## 2023-04-16 DIAGNOSIS — T82858A Stenosis of vascular prosthetic devices, implants and grafts, initial encounter: Secondary | ICD-10-CM | POA: Insufficient documentation

## 2023-04-16 DIAGNOSIS — I509 Heart failure, unspecified: Secondary | ICD-10-CM | POA: Insufficient documentation

## 2023-04-16 DIAGNOSIS — E1122 Type 2 diabetes mellitus with diabetic chronic kidney disease: Secondary | ICD-10-CM | POA: Insufficient documentation

## 2023-04-16 DIAGNOSIS — Y832 Surgical operation with anastomosis, bypass or graft as the cause of abnormal reaction of the patient, or of later complication, without mention of misadventure at the time of the procedure: Secondary | ICD-10-CM | POA: Insufficient documentation

## 2023-04-16 DIAGNOSIS — Z992 Dependence on renal dialysis: Secondary | ICD-10-CM | POA: Diagnosis not present

## 2023-04-16 DIAGNOSIS — Z794 Long term (current) use of insulin: Secondary | ICD-10-CM | POA: Diagnosis not present

## 2023-04-16 DIAGNOSIS — N186 End stage renal disease: Secondary | ICD-10-CM | POA: Insufficient documentation

## 2023-04-16 DIAGNOSIS — T82510A Breakdown (mechanical) of surgically created arteriovenous fistula, initial encounter: Secondary | ICD-10-CM | POA: Diagnosis not present

## 2023-04-16 HISTORY — PX: A/V SHUNT INTERVENTION: CATH118220

## 2023-04-16 SURGERY — A/V SHUNT INTERVENTION
Anesthesia: LOCAL

## 2023-04-16 MED ORDER — SODIUM CHLORIDE 0.9 % IV SOLN: INTRAVENOUS | Status: DC

## 2023-04-16 MED ORDER — MIDAZOLAM HCL 2 MG/2ML IJ SOLN
INTRAMUSCULAR | Status: AC
  Filled 2023-04-16: qty 2

## 2023-04-16 MED ORDER — ONDANSETRON HCL 4 MG/2ML IJ SOLN: 4.0000 mg | Freq: Four times a day (QID) | INTRAMUSCULAR | Status: DC | PRN

## 2023-04-16 MED ORDER — FENTANYL CITRATE (PF) 100 MCG/2ML IJ SOLN
INTRAMUSCULAR | Status: AC
  Filled 2023-04-16: qty 2

## 2023-04-16 MED ORDER — FENTANYL CITRATE (PF) 100 MCG/2ML IJ SOLN
INTRAMUSCULAR | Status: DC | PRN
  Administered 2023-04-16: 25 ug via INTRAVENOUS

## 2023-04-16 MED ORDER — IODIXANOL 320 MG/ML IV SOLN
INTRAVENOUS | Status: DC | PRN
Start: 2023-04-16 — End: 2023-04-16
  Administered 2023-04-16: 9 mL

## 2023-04-16 MED ORDER — MIDAZOLAM HCL 2 MG/2ML IJ SOLN
INTRAMUSCULAR | Status: DC | PRN
  Administered 2023-04-16: 1 mg via INTRAVENOUS

## 2023-04-16 MED ORDER — LIDOCAINE HCL (PF) 1 % IJ SOLN
INTRAMUSCULAR | Status: AC
  Filled 2023-04-16: qty 30

## 2023-04-16 MED ORDER — ACETAMINOPHEN 325 MG PO TABS: 650.0000 mg | ORAL_TABLET | ORAL | Status: DC | PRN

## 2023-04-16 MED ORDER — LIDOCAINE HCL (PF) 1 % IJ SOLN
INTRAMUSCULAR | Status: DC | PRN
  Administered 2023-04-16: 2 mL via INTRADERMAL

## 2023-04-16 SURGICAL SUPPLY — 9 items
BALLN MUSTANG 9X40X75 (BALLOONS) ×1 IMPLANT
BALLOON MUSTANG 9X40X75 (BALLOONS) IMPLANT
COVER DOME SNAP 22 D (MISCELLANEOUS) ×1 IMPLANT
GUIDEWIRE ANGLED .035 180CM (WIRE) IMPLANT
SHEATH PINNACLE R/O II 6F 4CM (SHEATH) IMPLANT
SYR MEDALLION 10ML (SYRINGE) IMPLANT
TRAY PV CATH (CUSTOM PROCEDURE TRAY) ×1 IMPLANT
TUBING CIL FLEX 10 FLL-RA (TUBING) ×1 IMPLANT
WIRE MICRO SET SILHO 5FR 7 (SHEATH) IMPLANT

## 2023-04-16 NOTE — H&P (Signed)
 [facility]  Vascular Access H&P  Reason for Consultation: infiltration and pain Requesting Provider: Dr. Lanier [facility]  HPI: Justin [name] is an 73 y.o. male with ESRD on HD, CHF, HL, DM who presents for evaluation of LUE AVF.    He has a R BC AVF created 2020 after a failed R RC AVF.  Recently he had an infiltration at dialysis after a difficult cannulation.  He isn't able to provide much history but says prior to that issue last week his AVF hadn't had problems.  He's not sure if he's had intervention on it previously.   He confirms NPO, no contrast allergy, has a driver.   Denies anticoagulation use.   PMH: Past Medical History:  Diagnosis Date   BPH (benign prostatic hyperplasia)    Diabetes (HCC)    Dysuria    Erectile dysfunction    ESRD on hemodialysis (HCC)    History of chronic CHF 11/19/2019   Hyperlipemia    Hypertension    Personal history of COVID-19 11/19/2019   Pinna disorder, left    Wears glasses    PSH: Past Surgical History:  Procedure Laterality Date   AV FISTULA PLACEMENT Left 04/30/2018   Procedure: CREATION OF RADIOCEPHALIC ARTERIOVENOUS FISTULA LEFT ARM;  Surgeon: Sherren Kerns, MD;  Location: [facility] OR;  Service: Vascular;  Laterality: Left;   AV FISTULA PLACEMENT Left 08/28/2018   Procedure: ARTERIOVENOUS (AV) BRACHIOCEPHALIC FISTULA CREATION LEFT UPPER ARM;  Surgeon: Cephus Shelling, MD;  Location: [facility] OR;  Service: Vascular;  Laterality: Left;   BIOPSY  02/24/2023   Procedure: BIOPSY;  Surgeon: Imogene Burn, MD;  Location: [facility] ENDOSCOPY;  Service: Gastroenterology;;   ESOPHAGOGASTRODUODENOSCOPY (EGD) WITH PROPOFOL N/A 02/24/2023   Procedure: ESOPHAGOGASTRODUODENOSCOPY (EGD) WITH PROPOFOL;  Surgeon: Imogene Burn, MD;  Location: [facility] ENDOSCOPY;  Service: Gastroenterology;  Laterality: N/A;  Patint is s/p right hip ORIF   INTRAMEDULLARY (IM) NAIL INTERTROCHANTERIC Right 02/17/2023   Procedure: INTRAMEDULLARY (IM) NAIL  INTERTROCHANTERIC;  Surgeon: Luci Bank, MD;  Location: [facility] OR;  Service: Orthopedics;  Laterality: Right;   IR FLUORO GUIDE CV LINE RIGHT  04/29/2018   IR US GUIDE VASC ACCESS RIGHT  04/29/2018   RIGHT/LEFT HEART CATH AND CORONARY ANGIOGRAPHY N/A 05/01/2018   Procedure: RIGHT/LEFT HEART CATH AND CORONARY ANGIOGRAPHY;  Surgeon: Lyn Records, MD;  Location: [facility] INVASIVE CV LAB;  Service: Cardiovascular;  Laterality: N/A;   RIGHT/LEFT HEART CATH AND CORONARY ANGIOGRAPHY N/A 09/29/2022   Procedure: RIGHT/LEFT HEART CATH AND CORONARY ANGIOGRAPHY;  Surgeon: Tonny Bollman, MD;  Location: [facility] INVASIVE CV LAB;  Service: Cardiovascular;  Laterality: N/A;   subdural hematoma Right 07/2020    Past Medical History:  Diagnosis Date   BPH (benign prostatic hyperplasia)    Diabetes (HCC)    Dysuria    Erectile dysfunction    ESRD on hemodialysis (HCC)    History of chronic CHF 11/19/2019   Hyperlipemia    Hypertension    Personal history of COVID-19 11/19/2019   Pinna disorder, left    Wears glasses     Medications:  I have reviewed the patient's current medications.  Medications Prior to Admission  Medication Sig Dispense Refill   acetaminophen (TYLENOL) 500 MG tablet Take 2 tablets (1,000 mg total) by mouth every 6 (six) hours as needed for mild pain (pain score 1-3), fever or headache.     amLODipine (NORVASC) 10 MG tablet Take 1 tablet by mouth once daily 90 tablet 0  aspirin EC 81 MG tablet Take 1 tablet (81 mg total) by mouth in the morning and at bedtime for 42 days, THEN 1 tablet (81 mg total) daily. Swallow whole..     bisacodyl (DULCOLAX) 5 MG EC tablet Take 1 tablet (5 mg total) by mouth daily as needed for moderate constipation.     cyanocobalamin (VITAMIN B12) 1000 MCG tablet Take 1,000 mcg by mouth daily.     docusate sodium (COLACE) 100 MG capsule Take 1 capsule (100 mg total) by mouth 2 (two) times daily.     ferric citrate (AURYXIA) 1 GM 210 MG(Fe) tablet Take 630 mg by  mouth 3 (three) times daily with meals.     hydrALAZINE (APRESOLINE) 25 MG tablet Take 2 tablets (50 mg total) by mouth See admin instructions. Take 50 mg 3 times daily on M, W, F(dialysis days) Take 50 mg twice daily on Tu, Th, Sat, Sun(non-dialysis days)     insulin aspart (NOVOLOG) 100 UNIT/ML injection Inject 0-6 Units into the skin 3 (three) times daily with meals. CBG 70 - 120: 0 units CBG 121 - 150: 0 units CBG 151 - 200: 1 unit CBG 201-250: 2 units CBG 251-300: 3 units CBG 301-350: 4 units CBG 351-400: 5 units CBG > 400: Give 6 units and call MD     insulin glargine-yfgn (SEMGLEE) 100 UNIT/ML injection Inject 0.12 mLs (12 Units total) into the skin at bedtime.     isosorbide mononitrate (IMDUR) 30 MG 24 hr tablet Take 1 tablet (30 mg total) by mouth 4 (four) times a week. Mon, Wed, Fri, Sun (non-dialysis days) 64 tablet 2   lactulose (CHRONULAC) 10 GM/15ML solution Take 30 mLs (20 g total) by mouth 2 (two) times daily.     metoCLOPramide (REGLAN) 10 MG tablet Take 10 mg by mouth every 6 (six) hours as needed for nausea or vomiting.     metoprolol succinate (TOPROL-XL) 50 MG 24 hr tablet Take 50 mg by mouth daily.     multivitamin (RENA-VIT) TABS tablet Take 1 tablet by mouth at bedtime.     nitroGLYCERIN (NITROSTAT) 0.4 MG SL tablet Place 0.4 mg under the tongue every 5 (five) minutes as needed for chest pain.     ondansetron (ZOFRAN) 4 MG tablet Take 1 tablet (4 mg total) by mouth every 6 (six) hours as needed for nausea or vomiting.     pantoprazole (PROTONIX) 40 MG tablet Take 1 tablet (40 mg total) by mouth 2 (two) times daily.     rosuvastatin (CRESTOR) 40 MG tablet Take 40 mg by mouth daily.     simethicone (MYLICON) 80 MG chewable tablet Chew 2 tablets (160 mg total) by mouth every 6 (six) hours as needed for flatulence. 30 tablet 0   traZODone (DESYREL) 100 MG tablet Take 1 tablet (100 mg total) by mouth at bedtime as needed for sleep.     Methoxy PEG-Epoetin Beta (MIRCERA IJ)        ALLERGIES:  No Known Allergies  FAM HX: Family History  Problem Relation Age of Onset   Stroke Mother    Diabetes Father     Social History:   reports that he has never smoked. He has never used smokeless tobacco. He reports that he does not currently use alcohol. He reports that he does not use drugs.  ROS: 12 system ROS neg except per HIP  There were no vitals taken for this visit. PHYSICAL EXAM: Gen: well appearing on stretcher  Eyes: EOMI ENT:  MMM, class 3 airway Neck: supple CV: RRR on monitor Back: clear on RA Extr:  LUE AVF with t/b, infiltration on back of arm, no erythema or TTP.  Augmentation is fair but appears limited but some distal collaterals, collapse is incomplete Neuro: nonfocal   No results found for this or any previous visit (from the past 48 hours).  No results found.  Assessment/PlanGary Dyana Glade [name] is an 73 y.o. male with ESRD on HD, CHF, HL, DM who presents for evaluation of LUE AVF due to recent difficult cannulation and infiltration.  **ESRD on HD with access dysfunction:  recent infiltration and difficult cannulation.  Some evidence for outflow stenosis on exam today.  He is consented for fistulogram with angioplasty if stenosis identified.  He is agreeable to proceed with use of conscious sedation with appropriate monitoring.   Baron Border 04/16/2023, 9:08 AM

## 2023-04-16 NOTE — Discharge Instructions (Addendum)

## 2023-04-16 NOTE — Op Note (Signed)
 Patient presents for evaluation of R BC AVF for recent infiltration and difficult cannulation.  BC AVF was placed in 2020 and his prior intervention reports are not immediately available.  On exam there is a large infiltration that is resolving.  There is a bruit and weak thrill with incomplete collapse with elevation of the arm.   Summary:  1)      The patient had successful angioplasty (9mm Gladiator 99% effaced ~20 atm) of significant stenosis in the cephalic vein arch at the proximal edge of a previously placed stent.  2)      The body of the cephalic vein fistula was patent with good flows. 3)      The centrals were widely patent. 4)      This left BCF remains amenable to future percutaneous intervention.  Description of procedure: The arm was prepped and draped in the usual sterile fashion. The left upper arm brachial cephalic fistula was cannulated (29528) with an 21G micropuncture needle directed in an antegrade direction. A guidewire was inserted and exchanged for a 6Fr sheath. Contrast [id number] injection via the side port of the sheath was performed. The angiogram of the fistula (40102) showed a mildly aneurysmal body of the left BCF, patent stented outflow cephalic vein and a 70% cephalic vein arch stenosis at the proximal edge of a previously placed stent.  The inflow anastomosis and left centrals were patent.  The wire was advanced centrally without any difficulty. An 9 mm Gladiator angioplasty balloon was inserted over a glide wire and positioned at the cephalic vein arch stenosis. Venous angioplasty (72536) was carried out to 20 ATM with 99% effacement of the waist on the balloon at the arch lesion.  It was not effaced due to intimal hyperplasia  Repeat angiogram showed <30% residual at the site of angioplasty with no evidence of extravasation.  The flow of contrast was quicker and the fistula was markedly less pulsatile with a great thril.  Hemostasis: A 3-0 ethilon purse string suture  was placed at the cannulation site on removal of the sheath.  Sedation: 1mg  Versed, 25mcg Fentanyl. Sedation time. 5 minutes  Contrast. 9 mL  Monitoring: Because of the patient's comorbid conditions and sedation during the procedure, continuous EKG monitoring and O2 saturation monitoring was performed throughout the procedure by the RN. There were no abnormal arrhythmias encountered.  Complications: None.   Diagnoses: I87.1 Stricture of vein  N18.6 ESRD T82.858A Stricture of access  Procedure Coding:  [phone] Cannulation and angiogram of fistula, venous angioplasty (cephalic vein arch) K7425 Contrast  Recommendations:  1. Continue to cannulate the fistula with 15G needles.  2. Refer back for problems with flows. 3. Remove the suture next treatment.   Discharge: The patient was discharged home in stable condition. The patient was given education regarding the care of the dialysis access AVF and specific instructions in case of any problems.

## 2023-04-17 DIAGNOSIS — Z992 Dependence on renal dialysis: Secondary | ICD-10-CM | POA: Diagnosis not present

## 2023-04-17 DIAGNOSIS — N2581 Secondary hyperparathyroidism of renal origin: Secondary | ICD-10-CM | POA: Diagnosis not present

## 2023-04-17 DIAGNOSIS — N186 End stage renal disease: Secondary | ICD-10-CM | POA: Diagnosis not present

## 2023-04-18 DIAGNOSIS — E785 Hyperlipidemia, unspecified: Secondary | ICD-10-CM | POA: Diagnosis not present

## 2023-04-18 DIAGNOSIS — N186 End stage renal disease: Secondary | ICD-10-CM | POA: Diagnosis not present

## 2023-04-18 DIAGNOSIS — Z992 Dependence on renal dialysis: Secondary | ICD-10-CM | POA: Diagnosis not present

## 2023-04-18 DIAGNOSIS — I2510 Atherosclerotic heart disease of native coronary artery without angina pectoris: Secondary | ICD-10-CM | POA: Diagnosis not present

## 2023-04-18 DIAGNOSIS — S72141D Displaced intertrochanteric fracture of right femur, subsequent encounter for closed fracture with routine healing: Secondary | ICD-10-CM | POA: Diagnosis not present

## 2023-04-18 DIAGNOSIS — E1122 Type 2 diabetes mellitus with diabetic chronic kidney disease: Secondary | ICD-10-CM | POA: Diagnosis not present

## 2023-04-18 DIAGNOSIS — I120 Hypertensive chronic kidney disease with stage 5 chronic kidney disease or end stage renal disease: Secondary | ICD-10-CM | POA: Diagnosis not present

## 2023-04-18 DIAGNOSIS — D631 Anemia in chronic kidney disease: Secondary | ICD-10-CM | POA: Diagnosis not present

## 2023-04-18 DIAGNOSIS — I350 Nonrheumatic aortic (valve) stenosis: Secondary | ICD-10-CM | POA: Diagnosis not present

## 2023-04-19 DIAGNOSIS — N186 End stage renal disease: Secondary | ICD-10-CM | POA: Diagnosis not present

## 2023-04-19 DIAGNOSIS — Z992 Dependence on renal dialysis: Secondary | ICD-10-CM | POA: Diagnosis not present

## 2023-04-19 DIAGNOSIS — N2581 Secondary hyperparathyroidism of renal origin: Secondary | ICD-10-CM | POA: Diagnosis not present

## 2023-04-21 DIAGNOSIS — N2581 Secondary hyperparathyroidism of renal origin: Secondary | ICD-10-CM | POA: Diagnosis not present

## 2023-04-21 DIAGNOSIS — N186 End stage renal disease: Secondary | ICD-10-CM | POA: Diagnosis not present

## 2023-04-21 DIAGNOSIS — Z992 Dependence on renal dialysis: Secondary | ICD-10-CM | POA: Diagnosis not present

## 2023-04-24 DIAGNOSIS — N2581 Secondary hyperparathyroidism of renal origin: Secondary | ICD-10-CM | POA: Diagnosis not present

## 2023-04-24 DIAGNOSIS — E1122 Type 2 diabetes mellitus with diabetic chronic kidney disease: Secondary | ICD-10-CM | POA: Diagnosis not present

## 2023-04-24 DIAGNOSIS — N186 End stage renal disease: Secondary | ICD-10-CM | POA: Diagnosis not present

## 2023-04-24 DIAGNOSIS — Z992 Dependence on renal dialysis: Secondary | ICD-10-CM | POA: Diagnosis not present

## 2023-04-24 DIAGNOSIS — S72141D Displaced intertrochanteric fracture of right femur, subsequent encounter for closed fracture with routine healing: Secondary | ICD-10-CM | POA: Diagnosis not present

## 2023-04-24 DIAGNOSIS — D631 Anemia in chronic kidney disease: Secondary | ICD-10-CM | POA: Diagnosis not present

## 2023-04-24 DIAGNOSIS — I120 Hypertensive chronic kidney disease with stage 5 chronic kidney disease or end stage renal disease: Secondary | ICD-10-CM | POA: Diagnosis not present

## 2023-04-24 DIAGNOSIS — I350 Nonrheumatic aortic (valve) stenosis: Secondary | ICD-10-CM | POA: Diagnosis not present

## 2023-04-24 DIAGNOSIS — Z794 Long term (current) use of insulin: Secondary | ICD-10-CM | POA: Diagnosis not present

## 2023-04-26 DIAGNOSIS — Z992 Dependence on renal dialysis: Secondary | ICD-10-CM | POA: Diagnosis not present

## 2023-04-26 DIAGNOSIS — N186 End stage renal disease: Secondary | ICD-10-CM | POA: Diagnosis not present

## 2023-04-26 DIAGNOSIS — N2581 Secondary hyperparathyroidism of renal origin: Secondary | ICD-10-CM | POA: Diagnosis not present

## 2023-04-27 DIAGNOSIS — N401 Enlarged prostate with lower urinary tract symptoms: Secondary | ICD-10-CM | POA: Diagnosis not present

## 2023-04-27 DIAGNOSIS — E785 Hyperlipidemia, unspecified: Secondary | ICD-10-CM | POA: Diagnosis not present

## 2023-04-27 DIAGNOSIS — N186 End stage renal disease: Secondary | ICD-10-CM | POA: Diagnosis not present

## 2023-04-27 DIAGNOSIS — I120 Hypertensive chronic kidney disease with stage 5 chronic kidney disease or end stage renal disease: Secondary | ICD-10-CM | POA: Diagnosis not present

## 2023-04-27 DIAGNOSIS — E1129 Type 2 diabetes mellitus with other diabetic kidney complication: Secondary | ICD-10-CM | POA: Diagnosis not present

## 2023-04-28 DIAGNOSIS — N2581 Secondary hyperparathyroidism of renal origin: Secondary | ICD-10-CM | POA: Diagnosis not present

## 2023-04-28 DIAGNOSIS — Z992 Dependence on renal dialysis: Secondary | ICD-10-CM | POA: Diagnosis not present

## 2023-04-28 DIAGNOSIS — N186 End stage renal disease: Secondary | ICD-10-CM | POA: Diagnosis not present

## 2023-04-30 DIAGNOSIS — Z992 Dependence on renal dialysis: Secondary | ICD-10-CM | POA: Diagnosis not present

## 2023-04-30 DIAGNOSIS — D631 Anemia in chronic kidney disease: Secondary | ICD-10-CM | POA: Diagnosis not present

## 2023-04-30 DIAGNOSIS — R82998 Other abnormal findings in urine: Secondary | ICD-10-CM | POA: Diagnosis not present

## 2023-04-30 DIAGNOSIS — N186 End stage renal disease: Secondary | ICD-10-CM | POA: Diagnosis not present

## 2023-04-30 DIAGNOSIS — I10 Essential (primary) hypertension: Secondary | ICD-10-CM | POA: Diagnosis not present

## 2023-04-30 DIAGNOSIS — K2210 Ulcer of esophagus without bleeding: Secondary | ICD-10-CM | POA: Diagnosis not present

## 2023-04-30 DIAGNOSIS — E1129 Type 2 diabetes mellitus with other diabetic kidney complication: Secondary | ICD-10-CM | POA: Diagnosis not present

## 2023-04-30 DIAGNOSIS — I700 Atherosclerosis of aorta: Secondary | ICD-10-CM | POA: Diagnosis not present

## 2023-04-30 DIAGNOSIS — I2510 Atherosclerotic heart disease of native coronary artery without angina pectoris: Secondary | ICD-10-CM | POA: Diagnosis not present

## 2023-04-30 DIAGNOSIS — I350 Nonrheumatic aortic (valve) stenosis: Secondary | ICD-10-CM | POA: Diagnosis not present

## 2023-04-30 DIAGNOSIS — Z1339 Encounter for screening examination for other mental health and behavioral disorders: Secondary | ICD-10-CM | POA: Diagnosis not present

## 2023-04-30 DIAGNOSIS — Z0000 Encounter for general adult medical examination without abnormal findings: Secondary | ICD-10-CM | POA: Diagnosis not present

## 2023-04-30 DIAGNOSIS — I120 Hypertensive chronic kidney disease with stage 5 chronic kidney disease or end stage renal disease: Secondary | ICD-10-CM | POA: Diagnosis not present

## 2023-04-30 DIAGNOSIS — Z794 Long term (current) use of insulin: Secondary | ICD-10-CM | POA: Diagnosis not present

## 2023-04-30 DIAGNOSIS — Z1331 Encounter for screening for depression: Secondary | ICD-10-CM | POA: Diagnosis not present

## 2023-05-01 DIAGNOSIS — N186 End stage renal disease: Secondary | ICD-10-CM | POA: Diagnosis not present

## 2023-05-01 DIAGNOSIS — Z992 Dependence on renal dialysis: Secondary | ICD-10-CM | POA: Diagnosis not present

## 2023-05-01 DIAGNOSIS — N2581 Secondary hyperparathyroidism of renal origin: Secondary | ICD-10-CM | POA: Diagnosis not present

## 2023-05-02 DIAGNOSIS — E1129 Type 2 diabetes mellitus with other diabetic kidney complication: Secondary | ICD-10-CM | POA: Diagnosis not present

## 2023-05-02 DIAGNOSIS — D631 Anemia in chronic kidney disease: Secondary | ICD-10-CM | POA: Diagnosis not present

## 2023-05-02 DIAGNOSIS — N186 End stage renal disease: Secondary | ICD-10-CM | POA: Diagnosis not present

## 2023-05-02 DIAGNOSIS — I350 Nonrheumatic aortic (valve) stenosis: Secondary | ICD-10-CM | POA: Diagnosis not present

## 2023-05-02 DIAGNOSIS — I120 Hypertensive chronic kidney disease with stage 5 chronic kidney disease or end stage renal disease: Secondary | ICD-10-CM | POA: Diagnosis not present

## 2023-05-02 DIAGNOSIS — Z992 Dependence on renal dialysis: Secondary | ICD-10-CM | POA: Diagnosis not present

## 2023-05-02 DIAGNOSIS — I2510 Atherosclerotic heart disease of native coronary artery without angina pectoris: Secondary | ICD-10-CM | POA: Diagnosis not present

## 2023-05-02 DIAGNOSIS — E785 Hyperlipidemia, unspecified: Secondary | ICD-10-CM | POA: Diagnosis not present

## 2023-05-02 DIAGNOSIS — E1122 Type 2 diabetes mellitus with diabetic chronic kidney disease: Secondary | ICD-10-CM | POA: Diagnosis not present

## 2023-05-02 DIAGNOSIS — S72141D Displaced intertrochanteric fracture of right femur, subsequent encounter for closed fracture with routine healing: Secondary | ICD-10-CM | POA: Diagnosis not present

## 2023-05-03 DIAGNOSIS — N186 End stage renal disease: Secondary | ICD-10-CM | POA: Diagnosis not present

## 2023-05-03 DIAGNOSIS — N2581 Secondary hyperparathyroidism of renal origin: Secondary | ICD-10-CM | POA: Diagnosis not present

## 2023-05-03 DIAGNOSIS — Z992 Dependence on renal dialysis: Secondary | ICD-10-CM | POA: Diagnosis not present

## 2023-05-05 DIAGNOSIS — N186 End stage renal disease: Secondary | ICD-10-CM | POA: Diagnosis not present

## 2023-05-05 DIAGNOSIS — N2581 Secondary hyperparathyroidism of renal origin: Secondary | ICD-10-CM | POA: Diagnosis not present

## 2023-05-05 DIAGNOSIS — Z992 Dependence on renal dialysis: Secondary | ICD-10-CM | POA: Diagnosis not present

## 2023-05-08 DIAGNOSIS — N2581 Secondary hyperparathyroidism of renal origin: Secondary | ICD-10-CM | POA: Diagnosis not present

## 2023-05-08 DIAGNOSIS — N186 End stage renal disease: Secondary | ICD-10-CM | POA: Diagnosis not present

## 2023-05-08 DIAGNOSIS — Z992 Dependence on renal dialysis: Secondary | ICD-10-CM | POA: Diagnosis not present

## 2023-05-10 DIAGNOSIS — N186 End stage renal disease: Secondary | ICD-10-CM | POA: Diagnosis not present

## 2023-05-10 DIAGNOSIS — N2581 Secondary hyperparathyroidism of renal origin: Secondary | ICD-10-CM | POA: Diagnosis not present

## 2023-05-10 DIAGNOSIS — Z992 Dependence on renal dialysis: Secondary | ICD-10-CM | POA: Diagnosis not present

## 2023-05-15 ENCOUNTER — Other Ambulatory Visit: Payer: Self-pay | Admitting: Cardiology

## 2023-05-15 DIAGNOSIS — N186 End stage renal disease: Secondary | ICD-10-CM | POA: Diagnosis not present

## 2023-05-15 DIAGNOSIS — Z992 Dependence on renal dialysis: Secondary | ICD-10-CM | POA: Diagnosis not present

## 2023-05-15 DIAGNOSIS — N2581 Secondary hyperparathyroidism of renal origin: Secondary | ICD-10-CM | POA: Diagnosis not present

## 2023-05-16 DIAGNOSIS — I2510 Atherosclerotic heart disease of native coronary artery without angina pectoris: Secondary | ICD-10-CM | POA: Diagnosis not present

## 2023-05-16 DIAGNOSIS — I120 Hypertensive chronic kidney disease with stage 5 chronic kidney disease or end stage renal disease: Secondary | ICD-10-CM | POA: Diagnosis not present

## 2023-05-16 DIAGNOSIS — I350 Nonrheumatic aortic (valve) stenosis: Secondary | ICD-10-CM | POA: Diagnosis not present

## 2023-05-16 DIAGNOSIS — Z992 Dependence on renal dialysis: Secondary | ICD-10-CM | POA: Diagnosis not present

## 2023-05-16 DIAGNOSIS — S72141D Displaced intertrochanteric fracture of right femur, subsequent encounter for closed fracture with routine healing: Secondary | ICD-10-CM | POA: Diagnosis not present

## 2023-05-16 DIAGNOSIS — E785 Hyperlipidemia, unspecified: Secondary | ICD-10-CM | POA: Diagnosis not present

## 2023-05-16 DIAGNOSIS — E1122 Type 2 diabetes mellitus with diabetic chronic kidney disease: Secondary | ICD-10-CM | POA: Diagnosis not present

## 2023-05-16 DIAGNOSIS — N186 End stage renal disease: Secondary | ICD-10-CM | POA: Diagnosis not present

## 2023-05-16 DIAGNOSIS — D631 Anemia in chronic kidney disease: Secondary | ICD-10-CM | POA: Diagnosis not present

## 2023-05-17 DIAGNOSIS — Z992 Dependence on renal dialysis: Secondary | ICD-10-CM | POA: Diagnosis not present

## 2023-05-17 DIAGNOSIS — N186 End stage renal disease: Secondary | ICD-10-CM | POA: Diagnosis not present

## 2023-05-17 DIAGNOSIS — N2581 Secondary hyperparathyroidism of renal origin: Secondary | ICD-10-CM | POA: Diagnosis not present

## 2023-05-19 DIAGNOSIS — N186 End stage renal disease: Secondary | ICD-10-CM | POA: Diagnosis not present

## 2023-05-19 DIAGNOSIS — N2581 Secondary hyperparathyroidism of renal origin: Secondary | ICD-10-CM | POA: Diagnosis not present

## 2023-05-19 DIAGNOSIS — Z992 Dependence on renal dialysis: Secondary | ICD-10-CM | POA: Diagnosis not present

## 2023-05-21 DIAGNOSIS — Z992 Dependence on renal dialysis: Secondary | ICD-10-CM | POA: Diagnosis not present

## 2023-05-21 DIAGNOSIS — N2581 Secondary hyperparathyroidism of renal origin: Secondary | ICD-10-CM | POA: Diagnosis not present

## 2023-05-21 DIAGNOSIS — N186 End stage renal disease: Secondary | ICD-10-CM | POA: Diagnosis not present

## 2023-05-22 DIAGNOSIS — N2581 Secondary hyperparathyroidism of renal origin: Secondary | ICD-10-CM | POA: Diagnosis not present

## 2023-05-22 DIAGNOSIS — N186 End stage renal disease: Secondary | ICD-10-CM | POA: Diagnosis not present

## 2023-05-22 DIAGNOSIS — Z992 Dependence on renal dialysis: Secondary | ICD-10-CM | POA: Diagnosis not present

## 2023-05-23 DIAGNOSIS — E1122 Type 2 diabetes mellitus with diabetic chronic kidney disease: Secondary | ICD-10-CM | POA: Diagnosis not present

## 2023-05-23 DIAGNOSIS — N186 End stage renal disease: Secondary | ICD-10-CM | POA: Diagnosis not present

## 2023-05-23 DIAGNOSIS — I120 Hypertensive chronic kidney disease with stage 5 chronic kidney disease or end stage renal disease: Secondary | ICD-10-CM | POA: Diagnosis not present

## 2023-05-23 DIAGNOSIS — Z992 Dependence on renal dialysis: Secondary | ICD-10-CM | POA: Diagnosis not present

## 2023-05-23 DIAGNOSIS — S72141D Displaced intertrochanteric fracture of right femur, subsequent encounter for closed fracture with routine healing: Secondary | ICD-10-CM | POA: Diagnosis not present

## 2023-05-23 DIAGNOSIS — E785 Hyperlipidemia, unspecified: Secondary | ICD-10-CM | POA: Diagnosis not present

## 2023-05-23 DIAGNOSIS — I2510 Atherosclerotic heart disease of native coronary artery without angina pectoris: Secondary | ICD-10-CM | POA: Diagnosis not present

## 2023-05-23 DIAGNOSIS — I350 Nonrheumatic aortic (valve) stenosis: Secondary | ICD-10-CM | POA: Diagnosis not present

## 2023-05-23 DIAGNOSIS — D631 Anemia in chronic kidney disease: Secondary | ICD-10-CM | POA: Diagnosis not present

## 2023-05-24 DIAGNOSIS — Z992 Dependence on renal dialysis: Secondary | ICD-10-CM | POA: Diagnosis not present

## 2023-05-24 DIAGNOSIS — N186 End stage renal disease: Secondary | ICD-10-CM | POA: Diagnosis not present

## 2023-05-24 DIAGNOSIS — N2581 Secondary hyperparathyroidism of renal origin: Secondary | ICD-10-CM | POA: Diagnosis not present

## 2023-05-26 DIAGNOSIS — Z992 Dependence on renal dialysis: Secondary | ICD-10-CM | POA: Diagnosis not present

## 2023-05-26 DIAGNOSIS — N186 End stage renal disease: Secondary | ICD-10-CM | POA: Diagnosis not present

## 2023-05-26 DIAGNOSIS — N2581 Secondary hyperparathyroidism of renal origin: Secondary | ICD-10-CM | POA: Diagnosis not present

## 2023-05-29 DIAGNOSIS — Z992 Dependence on renal dialysis: Secondary | ICD-10-CM | POA: Diagnosis not present

## 2023-05-29 DIAGNOSIS — N2581 Secondary hyperparathyroidism of renal origin: Secondary | ICD-10-CM | POA: Diagnosis not present

## 2023-05-29 DIAGNOSIS — N186 End stage renal disease: Secondary | ICD-10-CM | POA: Diagnosis not present

## 2023-05-31 DIAGNOSIS — E1122 Type 2 diabetes mellitus with diabetic chronic kidney disease: Secondary | ICD-10-CM | POA: Diagnosis not present

## 2023-05-31 DIAGNOSIS — N186 End stage renal disease: Secondary | ICD-10-CM | POA: Diagnosis not present

## 2023-05-31 DIAGNOSIS — D631 Anemia in chronic kidney disease: Secondary | ICD-10-CM | POA: Diagnosis not present

## 2023-05-31 DIAGNOSIS — N2581 Secondary hyperparathyroidism of renal origin: Secondary | ICD-10-CM | POA: Diagnosis not present

## 2023-05-31 DIAGNOSIS — E785 Hyperlipidemia, unspecified: Secondary | ICD-10-CM | POA: Diagnosis not present

## 2023-05-31 DIAGNOSIS — I120 Hypertensive chronic kidney disease with stage 5 chronic kidney disease or end stage renal disease: Secondary | ICD-10-CM | POA: Diagnosis not present

## 2023-05-31 DIAGNOSIS — Z794 Long term (current) use of insulin: Secondary | ICD-10-CM | POA: Diagnosis not present

## 2023-05-31 DIAGNOSIS — I2510 Atherosclerotic heart disease of native coronary artery without angina pectoris: Secondary | ICD-10-CM | POA: Diagnosis not present

## 2023-05-31 DIAGNOSIS — Z992 Dependence on renal dialysis: Secondary | ICD-10-CM | POA: Diagnosis not present

## 2023-06-02 DIAGNOSIS — E1129 Type 2 diabetes mellitus with other diabetic kidney complication: Secondary | ICD-10-CM | POA: Diagnosis not present

## 2023-06-02 DIAGNOSIS — N186 End stage renal disease: Secondary | ICD-10-CM | POA: Diagnosis not present

## 2023-06-02 DIAGNOSIS — N2581 Secondary hyperparathyroidism of renal origin: Secondary | ICD-10-CM | POA: Diagnosis not present

## 2023-06-02 DIAGNOSIS — Z992 Dependence on renal dialysis: Secondary | ICD-10-CM | POA: Diagnosis not present

## 2023-06-05 DIAGNOSIS — N2581 Secondary hyperparathyroidism of renal origin: Secondary | ICD-10-CM | POA: Diagnosis not present

## 2023-06-05 DIAGNOSIS — N186 End stage renal disease: Secondary | ICD-10-CM | POA: Diagnosis not present

## 2023-06-05 DIAGNOSIS — Z992 Dependence on renal dialysis: Secondary | ICD-10-CM | POA: Diagnosis not present

## 2023-06-06 DIAGNOSIS — I6523 Occlusion and stenosis of bilateral carotid arteries: Secondary | ICD-10-CM | POA: Insufficient documentation

## 2023-06-06 DIAGNOSIS — I1311 Hypertensive heart and chronic kidney disease without heart failure, with stage 5 chronic kidney disease, or end stage renal disease: Secondary | ICD-10-CM | POA: Insufficient documentation

## 2023-06-06 DIAGNOSIS — E538 Deficiency of other specified B group vitamins: Secondary | ICD-10-CM | POA: Insufficient documentation

## 2023-06-06 DIAGNOSIS — K2210 Ulcer of esophagus without bleeding: Secondary | ICD-10-CM

## 2023-06-06 HISTORY — DX: Hypertensive heart and chronic kidney disease without heart failure, with stage 5 chronic kidney disease, or end stage renal disease: I13.11

## 2023-06-06 HISTORY — DX: Occlusion and stenosis of bilateral carotid arteries: I65.23

## 2023-06-06 HISTORY — DX: Deficiency of other specified B group vitamins: E53.8

## 2023-06-06 HISTORY — DX: Ulcer of esophagus without bleeding: K22.10

## 2023-06-07 ENCOUNTER — Encounter: Payer: Self-pay | Admitting: Cardiology

## 2023-06-07 ENCOUNTER — Ambulatory Visit: Attending: Cardiology | Admitting: Cardiology

## 2023-06-07 VITALS — BP 140/58 | HR 67 | Ht 68.0 in | Wt 173.8 lb

## 2023-06-07 DIAGNOSIS — E1122 Type 2 diabetes mellitus with diabetic chronic kidney disease: Secondary | ICD-10-CM | POA: Diagnosis not present

## 2023-06-07 DIAGNOSIS — Z992 Dependence on renal dialysis: Secondary | ICD-10-CM | POA: Diagnosis not present

## 2023-06-07 DIAGNOSIS — I359 Nonrheumatic aortic valve disorder, unspecified: Secondary | ICD-10-CM

## 2023-06-07 DIAGNOSIS — E7800 Pure hypercholesterolemia, unspecified: Secondary | ICD-10-CM

## 2023-06-07 DIAGNOSIS — I2510 Atherosclerotic heart disease of native coronary artery without angina pectoris: Secondary | ICD-10-CM

## 2023-06-07 DIAGNOSIS — N2581 Secondary hyperparathyroidism of renal origin: Secondary | ICD-10-CM | POA: Diagnosis not present

## 2023-06-07 DIAGNOSIS — I5022 Chronic systolic (congestive) heart failure: Secondary | ICD-10-CM

## 2023-06-07 DIAGNOSIS — R0609 Other forms of dyspnea: Secondary | ICD-10-CM

## 2023-06-07 DIAGNOSIS — N186 End stage renal disease: Secondary | ICD-10-CM

## 2023-06-07 DIAGNOSIS — I10 Essential (primary) hypertension: Secondary | ICD-10-CM | POA: Diagnosis not present

## 2023-06-07 DIAGNOSIS — Z794 Long term (current) use of insulin: Secondary | ICD-10-CM

## 2023-06-07 NOTE — Progress Notes (Signed)
 Cardiology Office Note:    Date:  06/07/2023   ID:  Justin [name], DOB [birth year], MRN [id number]  PCP:  Rosslyn Coons, MD  Cardiologist:  Ralene Burger, MD    Referring MD: Rosslyn Coons, MD   Chief Complaint  Patient presents with   Follow-up    History of Present Illness:    Justin [name] is a 73 y.o. male  past medical history significant for end-stage kidney disease he is on dialysis he has been on dialysis for about 4 years, coronary artery disease cardiac catheterization done just recently show moderate stenosis of the LAD, also 70% of diagonal branch, 90% of small circumflex, 90% PDA. The reason for cardiac catheterization was also to assess his degree of aortic stenosis which appears to be moderate, what prompted investigation to begin with was the fact that he was potential candidate for kidney transplant.  Comes today to months for follow-up.  Since I seen him last time he fell down and broke his right hip.  That required surgical intervention.  I have no difficulty after that and seems to be doing quite well walk still with a walker around no chest pain tightness squeezing pressure mid chest no shortness of breath no passing out.  Past Medical History:  Diagnosis Date   BPH (benign prostatic hyperplasia)    Diabetes (HCC)    Dysuria    Erectile dysfunction    ESRD on hemodialysis (HCC)    History of chronic CHF 11/19/2019   Hyperlipemia    Hypertension    Personal history of COVID-19 11/19/2019   Pinna disorder, left    Wears glasses     Past Surgical History:  Procedure Laterality Date   A/V SHUNT INTERVENTION N/A 04/16/2023   Procedure: A/V SHUNT INTERVENTION;  Surgeon: Baron Border, MD;  [facility]: [facility] INV[facility] CV LAB;  Service: Cardiovascular;  Laterality: N/A;   AV FISTULA PLACEMENT Left 04/30/2018   Procedure: CREATION OF RADIOCEPHALIC ARTERIOVENOUS FISTULA LEFT ARM;  Surgeon: Richrd Char, MD;  Location: [facility] OR;  Service: Vascular;  Laterality:  Left;   AV FISTULA PLACEMENT Left 08/28/2018   Procedure: ARTERIOVENOUS (AV) BRACHIOCEPHALIC FISTULA CREATION LEFT UPPER ARM;  Surgeon: Young Hensen, MD;  Location: [facility] OR;  Service: Vascular;  Laterality: Left;   BIOPSY  02/24/2023   Procedure: BIOPSY;  Surgeon: Daina Drum, MD;  Location: [facility] ENDOSCOPY;  Service: Gastroenterology;;   ESOPHAGOGASTRODUODENOSCOPY (EGD) WITH PROPOFOL  N/A 02/24/2023   Procedure: ESOPHAGOGASTRODUODENOSCOPY (EGD) WITH PROPOFOL ;  Surgeon: Daina Drum, MD;  Location: [facility] ENDOSCOPY;  Service: Gastroenterology;  Laterality: N/A;  Patint is s/p right hip ORIF   INTRAMEDULLARY (IM) NAIL INTERTROCHANTERIC Right 02/17/2023   Procedure: INTRAMEDULLARY (IM) NAIL INTERTROCHANTERIC;  Surgeon: Gaylon Kea, MD;  Location: [facility] OR;  Service: Orthopedics;  Laterality: Right;   IR FLUORO GUIDE CV LINE RIGHT  04/29/2018   IR US  GUIDE VASC ACCESS RIGHT  04/29/2018   RIGHT/LEFT HEART CATH AND CORONARY ANGIOGRAPHY N/A 05/01/2018   Procedure: RIGHT/LEFT HEART CATH AND CORONARY ANGIOGRAPHY;  Surgeon: Arty Binning, MD;  [facility]: [facility];  Service: Cardiovascular;  Laterality: N/A;   RIGHT/LEFT HEART CATH AND CORONARY ANGIOGRAPHY N/A 09/29/2022   Procedure: RIGHT/LEFT HEART CATH AND CORONARY ANGIOGRAPHY;  Surgeon: Arnoldo Lapping, MD;  [facility] CV [facility];  Service: Cardiovascular;  Laterality: N/A;   subdural hematoma Right 07/2020    Current Medications: Current Meds  Medication Sig   acetaminophen  (TYLENOL ) 500 MG tablet Take 2  tablets (1,000 mg total) by mouth every 6 (six) hours as needed for mild pain (pain score 1-3), fever or headache.   amLODipine  (NORVASC ) 10 MG tablet Take 1 tablet (10 mg total) by mouth daily.   aspirin  EC 81 MG tablet Take 1 tablet (81 mg total) by mouth in the morning and at bedtime for 42 days, THEN 1 tablet (81 mg total) daily. Swallow whole..   bisacodyl  (DULCOLAX) 5 MG EC tablet Take 1 tablet (5 mg total) by mouth daily as  needed for moderate constipation.   cyanocobalamin  (VITAMIN B12) 1000 MCG tablet Take 1,000 mcg by mouth daily.   docusate sodium  (COLACE) 100 MG capsule Take 1 capsule (100 mg total) by mouth 2 (two) times daily.   ferric citrate  (AURYXIA ) 1 GM 210 MG(Fe) tablet Take 630 mg by mouth 3 (three) times daily with meals.   hydrALAZINE  (APRESOLINE ) 25 MG tablet Take 2 tablets (50 mg total) by mouth See admin instructions. Take 50 mg 3 times daily on M, W, F(dialysis days) Take 50 mg twice daily on Tu, Th, Sat, Sun(non-dialysis days)   insulin  aspart (NOVOLOG ) 100 UNIT/ML injection Inject 0-6 Units into the skin 3 (three) times daily with meals. CBG 70 - 120: 0 units CBG 121 - 150: 0 units CBG 151 - 200: 1 unit CBG 201-250: 2 units CBG 251-300: 3 units CBG 301-350: 4 units CBG 351-400: 5 units CBG > 400: Give 6 units and call MD   insulin  glargine-yfgn (SEMGLEE ) 100 UNIT/ML injection Inject 0.12 mLs (12 Units total) into the skin at bedtime.   isosorbide  mononitrate (IMDUR ) 30 MG 24 hr tablet Take 1 tablet (30 mg total) by mouth 4 (four) times a week. Mon, Wed, Fri, Sun (non-dialysis days)   lactulose  (CHRONULAC ) 10 GM/15ML solution Take 30 mLs (20 g total) by mouth 2 (two) times daily.   Methoxy PEG-Epoetin Beta (MIRCERA IJ) Inject 1 Dose into the skin as directed.   metoCLOPramide  (REGLAN ) 10 MG tablet Take 10 mg by mouth every 6 (six) hours as needed for nausea or vomiting.   metoprolol  succinate (TOPROL -XL) 50 MG 24 hr tablet Take 50 mg by mouth daily.   multivitamin (RENA-VIT) TABS tablet Take 1 tablet by mouth at bedtime.   nitroGLYCERIN  (NITROSTAT ) 0.4 MG SL tablet Place 0.4 mg under the tongue every 5 (five) minutes as needed for chest pain.   ondansetron  (ZOFRAN ) 4 MG tablet Take 1 tablet (4 mg total) by mouth every 6 (six) hours as needed for nausea or vomiting.   pantoprazole  (PROTONIX ) 40 MG tablet Take 1 tablet (40 mg total) by mouth 2 (two) times daily.   rosuvastatin  (CRESTOR ) 40 MG  tablet Take 40 mg by mouth daily.   simethicone  (MYLICON) 80 MG chewable tablet Chew 2 tablets (160 mg total) by mouth every 6 (six) hours as needed for flatulence.   traZODone  (DESYREL ) 100 MG tablet Take 1 tablet (100 mg total) by mouth at bedtime as needed for sleep.     Allergies:   Patient has no known allergies.   Social History   Socioeconomic History   Marital status: Divorced    Spouse name: Not on file   Number of children: 1   Years of education: Not on file   Highest education level: Not on file  Occupational History   Not on file  Tobacco Use   Smoking status: Never   Smokeless tobacco: Never  Vaping Use   Vaping status: Never Used  Substance and Sexual Activity  Alcohol use: Not Currently   Drug use: Never   Sexual activity: Not Currently  Other Topics Concern   Not on file  Social History Narrative   Not on file   Social Drivers of Health   Financial Resource Strain: Low Risk  (07/13/2020)   Received from [facility], [facility]   Overall Financial Resource Strain (CARDIA)    Difficulty of Paying Living Expenses: Not very hard  Food Insecurity: No Food Insecurity (02/17/2023)   Hunger Vital Sign    Worried About Running Out of Food in the Last Year: Never true    Ran Out of Food in the Last Year: Never true  Transportation Needs: No Transportation Needs (02/17/2023)   PRAPARE - Administrator, Civil Service (Medical): No    Lack of Transportation (Non-Medical): No  Physical Activity: Not on file  Stress: Not on file  Social Connections: Socially Integrated (02/17/2023)   Social Connection and Isolation Panel [NHANES]    Frequency of Communication with Friends and Family: More than three times a week    Frequency of Social Gatherings with Friends and Family: More than three times a week    Attends Religious Services: More than 4 times per year    Active Member of [employer] or Organizations: Yes    Attends Banker Meetings: 1  to 4 times per year    Marital Status: Living with partner     Family History: The patient's family history includes Diabetes in his father; Stroke in his mother. ROS:   Please see the history of present illness.    All 14 point review of systems negative except as described per history of present illness  EKGs/Labs/Other Studies Reviewed:         Recent Labs: 02/26/2023: BUN 138; Creatinine, Ser 13.53; Hemoglobin 10.1; Platelets 328; Potassium 4.9; Sodium 130  Recent Lipid Panel    Component Value Date/Time   CHOL 109 04/25/2018 0429   TRIG 73 04/25/2018 0429   HDL 34 (L) 04/25/2018 0429   CHOLHDL 3.2 04/25/2018 0429   VLDL 15 04/25/2018 0429   LDLCALC 60 04/25/2018 0429   LDLDIRECT 32 06/05/2019 1639    Physical Exam:    VS:  BP (!) 140/58 (BP Location: Right Arm, Patient Position: Sitting)   Pulse 67   Ht 5\' 8"  (1.727 m)   Wt 173 lb 12.8 oz (78.8 kg)   SpO2 91%   BMI 26.43 kg/m     Wt Readings from Last 3 Encounters:  06/07/23 173 lb 12.8 oz (78.8 kg)  02/26/23 (S) 172 lb 9.9 oz (78.3 kg)  12/06/22 184 lb (83.5 kg)     GEN:  Well nourished, well developed in no acute distress HEENT: Normal NECK: No JVD; No carotid bruits LYMPHATICS: No lymphadenopathy CARDIAC: RRR, no murmurs, no rubs, no gallops RESPIRATORY:  Clear to auscultation without rales, wheezing or rhonchi  ABDOMEN: Soft, non-tender, non-distended MUSCULOSKELETAL:  No edema; No deformity  SKIN: Warm and dry LOWER EXTREMITIES: no swelling NEUROLOGIC:  Alert and oriented x 3 PSYCHIATRIC:  Normal affect   ASSESSMENT:    1. Aortic valve disorder   2. Chronic systolic CHF (congestive heart failure) (HCC) - now with recovered LVEF   3. Coronary artery disease involving native coronary artery of native heart without angina pectoris   4. Essential hypertension   5. Type 2 diabetes mellitus with chronic kidney disease on chronic dialysis, with long-term current use of insulin  (HCC)  6. Pure  hypercholesterolemia    PLAN:    In order of problems listed above:  Aortic valve stenosis, time to reassess the valve.  I am concerned he is a dialysis patient and typically aortic stenosis have a tendency to progress more rapidly and this clinical scenario, echocardiogram will be done.  Likely he does not have any dramatic symptoms there is no shortness of breath there is no chest pain no passing out. Chronic systolic congestive heart failure.  Will wait for echocardiogram to reassess his level ejection fraction. Coronary disease does have any signs and symptoms that will indicate worsening of the condition continue monitoring. Type 2 diabetes followed by antimedicine team. Dyslipidemia I did review K PN show me LDL 33 HDL 26 continue present management. End-stage kidney disease on dialysis   Medication Adjustments/Labs and Tests Ordered: Current medicines are reviewed at length with the patient today.  Concerns regarding medicines are outlined above.  No orders of the defined types were placed in this encounter.  Medication changes: No orders of the defined types were placed in this encounter.   Signed, Manfred Seed, MD, [facility] 06/07/2023 2:44 PM    [facility]

## 2023-06-07 NOTE — Addendum Note (Signed)
 Addended by: Shawnee Dellen D on: 06/07/2023 02:48 PM   Modules accepted: Orders

## 2023-06-07 NOTE — Patient Instructions (Signed)
 Medication Instructions:  Your physician recommends that you continue on your current medications as directed. Please refer to the Current Medication list given to you today.  *If you need a refill on your cardiac medications before your next appointment, please call your pharmacy*   Lab Work: None Ordered If you have labs (blood work) drawn today and your tests are completely normal, you will receive your results only by: MyChart Message (if you have MyChart) OR A paper copy in the mail If you have any lab test that is abnormal or we need to change your treatment, we will call you to review the results.   Testing/Procedures: Your physician has requested that you have an echocardiogram. Echocardiography is a painless test that uses sound waves to create images of your heart. It provides your doctor with information about the size and shape of your heart and how well your heart's chambers and valves are working. This procedure takes approximately one hour. There are no restrictions for this procedure. Please do NOT wear cologne, perfume, aftershave, or lotions (deodorant is allowed). Please arrive 15 minutes prior to your appointment time.  Please note: We ask at that you not bring children with you during ultrasound (echo/ vascular) testing. Due to room size and safety concerns, children are not allowed in the ultrasound rooms during exams. Our front office staff cannot provide observation of children in our lobby area while testing is being conducted. An adult accompanying a patient to their appointment will only be allowed in the ultrasound room at the discretion of the ultrasound technician under special circumstances. We apologize for any inconvenience.    Follow-Up: At [facility], you and your health needs are our priority.  As part of our continuing mission to provide you with exceptional heart care, we have created designated Provider Care Teams.  These Care Teams include your  primary Cardiologist (physician) and Advanced Practice Providers (APPs -  Physician Assistants and Nurse Practitioners) who all work together to provide you with the care you need, when you need it.  We recommend signing up for the patient portal called "MyChart".  Sign up information is provided on this After Visit Summary.  MyChart is used to connect with patients for Virtual Visits (Telemedicine).  Patients are able to view lab/test results, encounter notes, upcoming appointments, etc.  Non-urgent messages can be sent to your provider as well.   To learn more about what you can do with MyChart, go to [email or web address].    Your next appointment:   3 month(s)  The format for your next appointment:   In Person  Provider:   Gypsy Balsam, MD    Other Instructions NA

## 2023-06-09 DIAGNOSIS — Z992 Dependence on renal dialysis: Secondary | ICD-10-CM | POA: Diagnosis not present

## 2023-06-09 DIAGNOSIS — N2581 Secondary hyperparathyroidism of renal origin: Secondary | ICD-10-CM | POA: Diagnosis not present

## 2023-06-09 DIAGNOSIS — N186 End stage renal disease: Secondary | ICD-10-CM | POA: Diagnosis not present

## 2023-06-12 DIAGNOSIS — N186 End stage renal disease: Secondary | ICD-10-CM | POA: Diagnosis not present

## 2023-06-12 DIAGNOSIS — N2581 Secondary hyperparathyroidism of renal origin: Secondary | ICD-10-CM | POA: Diagnosis not present

## 2023-06-12 DIAGNOSIS — Z992 Dependence on renal dialysis: Secondary | ICD-10-CM | POA: Diagnosis not present

## 2023-06-14 DIAGNOSIS — Z992 Dependence on renal dialysis: Secondary | ICD-10-CM | POA: Diagnosis not present

## 2023-06-14 DIAGNOSIS — N2581 Secondary hyperparathyroidism of renal origin: Secondary | ICD-10-CM | POA: Diagnosis not present

## 2023-06-14 DIAGNOSIS — N186 End stage renal disease: Secondary | ICD-10-CM | POA: Diagnosis not present

## 2023-06-16 DIAGNOSIS — N186 End stage renal disease: Secondary | ICD-10-CM | POA: Diagnosis not present

## 2023-06-16 DIAGNOSIS — N2581 Secondary hyperparathyroidism of renal origin: Secondary | ICD-10-CM | POA: Diagnosis not present

## 2023-06-16 DIAGNOSIS — Z992 Dependence on renal dialysis: Secondary | ICD-10-CM | POA: Diagnosis not present

## 2023-06-19 DIAGNOSIS — N186 End stage renal disease: Secondary | ICD-10-CM | POA: Diagnosis not present

## 2023-06-19 DIAGNOSIS — N2581 Secondary hyperparathyroidism of renal origin: Secondary | ICD-10-CM | POA: Diagnosis not present

## 2023-06-19 DIAGNOSIS — Z992 Dependence on renal dialysis: Secondary | ICD-10-CM | POA: Diagnosis not present

## 2023-06-21 DIAGNOSIS — N186 End stage renal disease: Secondary | ICD-10-CM | POA: Diagnosis not present

## 2023-06-21 DIAGNOSIS — Z992 Dependence on renal dialysis: Secondary | ICD-10-CM | POA: Diagnosis not present

## 2023-06-21 DIAGNOSIS — N2581 Secondary hyperparathyroidism of renal origin: Secondary | ICD-10-CM | POA: Diagnosis not present

## 2023-06-23 DIAGNOSIS — Z992 Dependence on renal dialysis: Secondary | ICD-10-CM | POA: Diagnosis not present

## 2023-06-23 DIAGNOSIS — N2581 Secondary hyperparathyroidism of renal origin: Secondary | ICD-10-CM | POA: Diagnosis not present

## 2023-06-23 DIAGNOSIS — N186 End stage renal disease: Secondary | ICD-10-CM | POA: Diagnosis not present

## 2023-06-26 DIAGNOSIS — N186 End stage renal disease: Secondary | ICD-10-CM | POA: Diagnosis not present

## 2023-06-26 DIAGNOSIS — Z992 Dependence on renal dialysis: Secondary | ICD-10-CM | POA: Diagnosis not present

## 2023-06-26 DIAGNOSIS — N2581 Secondary hyperparathyroidism of renal origin: Secondary | ICD-10-CM | POA: Diagnosis not present

## 2023-06-27 DIAGNOSIS — I10 Essential (primary) hypertension: Secondary | ICD-10-CM | POA: Diagnosis not present

## 2023-06-27 DIAGNOSIS — R58 Hemorrhage, not elsewhere classified: Secondary | ICD-10-CM | POA: Diagnosis not present

## 2023-06-28 DIAGNOSIS — N2581 Secondary hyperparathyroidism of renal origin: Secondary | ICD-10-CM | POA: Diagnosis not present

## 2023-06-28 DIAGNOSIS — Z992 Dependence on renal dialysis: Secondary | ICD-10-CM | POA: Diagnosis not present

## 2023-06-28 DIAGNOSIS — N186 End stage renal disease: Secondary | ICD-10-CM | POA: Diagnosis not present

## 2023-06-30 DIAGNOSIS — N186 End stage renal disease: Secondary | ICD-10-CM | POA: Diagnosis not present

## 2023-06-30 DIAGNOSIS — N2581 Secondary hyperparathyroidism of renal origin: Secondary | ICD-10-CM | POA: Diagnosis not present

## 2023-06-30 DIAGNOSIS — Z992 Dependence on renal dialysis: Secondary | ICD-10-CM | POA: Diagnosis not present

## 2023-07-02 DIAGNOSIS — Z992 Dependence on renal dialysis: Secondary | ICD-10-CM | POA: Diagnosis not present

## 2023-07-02 DIAGNOSIS — N186 End stage renal disease: Secondary | ICD-10-CM | POA: Diagnosis not present

## 2023-07-02 DIAGNOSIS — E1129 Type 2 diabetes mellitus with other diabetic kidney complication: Secondary | ICD-10-CM | POA: Diagnosis not present

## 2023-07-03 DIAGNOSIS — N2581 Secondary hyperparathyroidism of renal origin: Secondary | ICD-10-CM | POA: Diagnosis not present

## 2023-07-03 DIAGNOSIS — Z992 Dependence on renal dialysis: Secondary | ICD-10-CM | POA: Diagnosis not present

## 2023-07-03 DIAGNOSIS — N186 End stage renal disease: Secondary | ICD-10-CM | POA: Diagnosis not present

## 2023-07-05 DIAGNOSIS — Z992 Dependence on renal dialysis: Secondary | ICD-10-CM | POA: Diagnosis not present

## 2023-07-05 DIAGNOSIS — N2581 Secondary hyperparathyroidism of renal origin: Secondary | ICD-10-CM | POA: Diagnosis not present

## 2023-07-05 DIAGNOSIS — N186 End stage renal disease: Secondary | ICD-10-CM | POA: Diagnosis not present

## 2023-07-07 DIAGNOSIS — N2581 Secondary hyperparathyroidism of renal origin: Secondary | ICD-10-CM | POA: Diagnosis not present

## 2023-07-07 DIAGNOSIS — N186 End stage renal disease: Secondary | ICD-10-CM | POA: Diagnosis not present

## 2023-07-07 DIAGNOSIS — Z992 Dependence on renal dialysis: Secondary | ICD-10-CM | POA: Diagnosis not present

## 2023-07-10 DIAGNOSIS — Z992 Dependence on renal dialysis: Secondary | ICD-10-CM | POA: Diagnosis not present

## 2023-07-10 DIAGNOSIS — N2581 Secondary hyperparathyroidism of renal origin: Secondary | ICD-10-CM | POA: Diagnosis not present

## 2023-07-10 DIAGNOSIS — N186 End stage renal disease: Secondary | ICD-10-CM | POA: Diagnosis not present

## 2023-07-11 ENCOUNTER — Ambulatory Visit: Attending: Cardiology

## 2023-07-11 DIAGNOSIS — R0609 Other forms of dyspnea: Secondary | ICD-10-CM

## 2023-07-12 LAB — ECHOCARDIOGRAM COMPLETE
AR max vel: 0.86 cm2
AV Area VTI: 0.79 cm2
AV Area mean vel: 0.77 cm2
AV Mean grad: 23.8 mmHg
AV Peak grad: 41.3 mmHg
Ao pk vel: 3.21 m/s
Area-P 1/2: 3.78 cm2
MV M vel: 3.47 m/s
MV Peak grad: 48.2 mmHg
S' Lateral: 3.1 cm

## 2023-07-13 ENCOUNTER — Encounter (HOSPITAL_COMMUNITY): Payer: Self-pay

## 2023-07-13 ENCOUNTER — Inpatient Hospital Stay (HOSPITAL_COMMUNITY)
Admission: AD | Admit: 2023-07-13 | Discharge: 2023-07-18 | DRG: 252 | Disposition: A | Discharge status: Home / Self Care | Source: Other Acute Inpatient Hospital | Attending: Internal Medicine | Admitting: Internal Medicine

## 2023-07-13 DIAGNOSIS — R4182 Altered mental status, unspecified: Secondary | ICD-10-CM | POA: Diagnosis not present

## 2023-07-13 DIAGNOSIS — Z992 Dependence on renal dialysis: Secondary | ICD-10-CM | POA: Diagnosis not present

## 2023-07-13 DIAGNOSIS — R58 Hemorrhage, not elsewhere classified: Secondary | ICD-10-CM | POA: Diagnosis not present

## 2023-07-13 DIAGNOSIS — R571 Hypovolemic shock: Secondary | ICD-10-CM | POA: Diagnosis not present

## 2023-07-13 DIAGNOSIS — Z95820 Peripheral vascular angioplasty status with implants and grafts: Secondary | ICD-10-CM | POA: Diagnosis not present

## 2023-07-13 DIAGNOSIS — R578 Other shock: Secondary | ICD-10-CM | POA: Diagnosis not present

## 2023-07-13 DIAGNOSIS — Z860100 Personal history of colon polyps, unspecified: Secondary | ICD-10-CM | POA: Diagnosis not present

## 2023-07-13 DIAGNOSIS — Z79899 Other long term (current) drug therapy: Secondary | ICD-10-CM | POA: Diagnosis not present

## 2023-07-13 DIAGNOSIS — E7800 Pure hypercholesterolemia, unspecified: Secondary | ICD-10-CM | POA: Diagnosis present

## 2023-07-13 DIAGNOSIS — I350 Nonrheumatic aortic (valve) stenosis: Secondary | ICD-10-CM | POA: Diagnosis present

## 2023-07-13 DIAGNOSIS — Z833 Family history of diabetes mellitus: Secondary | ICD-10-CM

## 2023-07-13 DIAGNOSIS — E1122 Type 2 diabetes mellitus with diabetic chronic kidney disease: Secondary | ICD-10-CM | POA: Diagnosis present

## 2023-07-13 DIAGNOSIS — I132 Hypertensive heart and chronic kidney disease with heart failure and with stage 5 chronic kidney disease, or end stage renal disease: Secondary | ICD-10-CM | POA: Diagnosis not present

## 2023-07-13 DIAGNOSIS — D631 Anemia in chronic kidney disease: Secondary | ICD-10-CM | POA: Diagnosis not present

## 2023-07-13 DIAGNOSIS — I959 Hypotension, unspecified: Secondary | ICD-10-CM | POA: Diagnosis not present

## 2023-07-13 DIAGNOSIS — E872 Acidosis, unspecified: Secondary | ICD-10-CM | POA: Diagnosis present

## 2023-07-13 DIAGNOSIS — I10 Essential (primary) hypertension: Secondary | ICD-10-CM | POA: Diagnosis not present

## 2023-07-13 DIAGNOSIS — T82838A Hemorrhage of vascular prosthetic devices, implants and grafts, initial encounter: Secondary | ICD-10-CM | POA: Diagnosis not present

## 2023-07-13 DIAGNOSIS — Z794 Long term (current) use of insulin: Secondary | ICD-10-CM

## 2023-07-13 DIAGNOSIS — Z7982 Long term (current) use of aspirin: Secondary | ICD-10-CM | POA: Diagnosis not present

## 2023-07-13 DIAGNOSIS — R109 Unspecified abdominal pain: Secondary | ICD-10-CM | POA: Diagnosis not present

## 2023-07-13 DIAGNOSIS — N400 Enlarged prostate without lower urinary tract symptoms: Secondary | ICD-10-CM | POA: Diagnosis present

## 2023-07-13 DIAGNOSIS — I2510 Atherosclerotic heart disease of native coronary artery without angina pectoris: Secondary | ICD-10-CM | POA: Diagnosis present

## 2023-07-13 DIAGNOSIS — R197 Diarrhea, unspecified: Secondary | ICD-10-CM | POA: Diagnosis not present

## 2023-07-13 DIAGNOSIS — E119 Type 2 diabetes mellitus without complications: Secondary | ICD-10-CM | POA: Diagnosis not present

## 2023-07-13 DIAGNOSIS — N186 End stage renal disease: Secondary | ICD-10-CM | POA: Diagnosis present

## 2023-07-13 DIAGNOSIS — I5032 Chronic diastolic (congestive) heart failure: Secondary | ICD-10-CM | POA: Diagnosis present

## 2023-07-13 DIAGNOSIS — Z823 Family history of stroke: Secondary | ICD-10-CM

## 2023-07-13 DIAGNOSIS — E1165 Type 2 diabetes mellitus with hyperglycemia: Secondary | ICD-10-CM | POA: Diagnosis present

## 2023-07-13 DIAGNOSIS — I5030 Unspecified diastolic (congestive) heart failure: Secondary | ICD-10-CM | POA: Diagnosis not present

## 2023-07-13 DIAGNOSIS — T82858A Stenosis of vascular prosthetic devices, implants and grafts, initial encounter: Secondary | ICD-10-CM | POA: Diagnosis not present

## 2023-07-13 DIAGNOSIS — Z8616 Personal history of COVID-19: Secondary | ICD-10-CM

## 2023-07-13 DIAGNOSIS — D62 Acute posthemorrhagic anemia: Secondary | ICD-10-CM | POA: Diagnosis not present

## 2023-07-13 DIAGNOSIS — Y841 Kidney dialysis as the cause of abnormal reaction of the patient, or of later complication, without mention of misadventure at the time of the procedure: Secondary | ICD-10-CM | POA: Diagnosis present

## 2023-07-13 DIAGNOSIS — I700 Atherosclerosis of aorta: Secondary | ICD-10-CM | POA: Diagnosis not present

## 2023-07-13 DIAGNOSIS — R0902 Hypoxemia: Secondary | ICD-10-CM | POA: Diagnosis not present

## 2023-07-13 DIAGNOSIS — J9690 Respiratory failure, unspecified, unspecified whether with hypoxia or hypercapnia: Secondary | ICD-10-CM | POA: Diagnosis not present

## 2023-07-13 DIAGNOSIS — E8721 Acute metabolic acidosis: Secondary | ICD-10-CM | POA: Diagnosis not present

## 2023-07-13 DIAGNOSIS — K8020 Calculus of gallbladder without cholecystitis without obstruction: Secondary | ICD-10-CM | POA: Diagnosis not present

## 2023-07-13 DIAGNOSIS — I5022 Chronic systolic (congestive) heart failure: Secondary | ICD-10-CM | POA: Diagnosis not present

## 2023-07-13 HISTORY — DX: Other shock: R57.8

## 2023-07-13 LAB — CBC
HCT: 24.1 % — ABNORMAL LOW (ref 39.0–52.0)
Hemoglobin: 8 g/dL — ABNORMAL LOW (ref 13.0–17.0)
MCH: 29 pg (ref 26.0–34.0)
MCHC: 33.2 g/dL (ref 30.0–36.0)
MCV: 87.3 fL (ref 80.0–100.0)
Platelets: 231 K/uL (ref 150–400)
RBC: 2.76 MIL/uL — ABNORMAL LOW (ref 4.22–5.81)
RDW: 16.4 % — ABNORMAL HIGH (ref 11.5–15.5)
WBC: 19.2 K/uL — ABNORMAL HIGH (ref 4.0–10.5)
nRBC: 0 % (ref 0.0–0.2)

## 2023-07-13 LAB — COMPREHENSIVE METABOLIC PANEL WITH GFR
ALT: 121 U/L — ABNORMAL HIGH (ref 0–44)
AST: 122 U/L — ABNORMAL HIGH (ref 15–41)
Albumin: 2.4 g/dL — ABNORMAL LOW (ref 3.5–5.0)
Alkaline Phosphatase: 69 U/L (ref 38–126)
Anion gap: 19 — ABNORMAL HIGH (ref 5–15)
BUN: 58 mg/dL — ABNORMAL HIGH (ref 8–23)
CO2: 15 mmol/L — ABNORMAL LOW (ref 22–32)
Calcium: 7.3 mg/dL — ABNORMAL LOW (ref 8.9–10.3)
Chloride: 94 mmol/L — ABNORMAL LOW (ref 98–111)
Creatinine, Ser: 10.58 mg/dL — ABNORMAL HIGH (ref 0.61–1.24)
GFR, Estimated: 5 mL/min — ABNORMAL LOW (ref >60)
Glucose, Bld: 320 mg/dL — ABNORMAL HIGH (ref 70–99)
Potassium: 4.4 mmol/L (ref 3.5–5.1)
Sodium: 128 mmol/L — ABNORMAL LOW (ref 135–145)
Total Bilirubin: 0.7 mg/dL (ref 0.0–1.2)
Total Protein: 5.2 g/dL — ABNORMAL LOW (ref 6.5–8.1)

## 2023-07-13 LAB — PROTIME-INR
INR: 1.6 — ABNORMAL HIGH (ref 0.8–1.2)
Prothrombin Time: 20 s — ABNORMAL HIGH (ref 11.4–15.2)

## 2023-07-13 LAB — GLUCOSE, CAPILLARY
Glucose-Capillary: 310 mg/dL — ABNORMAL HIGH (ref 70–99)
Glucose-Capillary: 305 mg/dL — ABNORMAL HIGH (ref 70–99)

## 2023-07-13 LAB — MAGNESIUM: Magnesium: 2 mg/dL (ref 1.7–2.4)

## 2023-07-13 LAB — PHOSPHORUS: Phosphorus: 10.3 mg/dL — ABNORMAL HIGH (ref 2.5–4.6)

## 2023-07-13 LAB — MRSA NEXT GEN BY PCR, NASAL: MRSA by PCR Next Gen: NOT DETECTED

## 2023-07-13 LAB — LACTIC ACID, PLASMA: Lactic Acid, Venous: 3.3 mmol/L (ref 0.5–1.9)

## 2023-07-13 LAB — APTT: aPTT: 40 s — ABNORMAL HIGH (ref 24–36)

## 2023-07-13 MED ORDER — INSULIN ASPART 100 UNIT/ML IJ SOLN
0.0000 [IU] | INTRAMUSCULAR | Status: DC
  Administered 2023-07-14: 4 [IU] via SUBCUTANEOUS
  Administered 2023-07-14: 2 [IU] via SUBCUTANEOUS

## 2023-07-13 MED ORDER — SODIUM BICARBONATE 8.4 % IV SOLN
200.0000 meq | Freq: Once | INTRAVENOUS | Status: AC
  Administered 2023-07-14: 200 meq via INTRAVENOUS
  Filled 2023-07-13: qty 50

## 2023-07-13 MED ORDER — NOREPINEPHRINE 4 MG/250ML-% IV SOLN
0.0000 ug/min | INTRAVENOUS | Status: DC
  Administered 2023-07-13 – 2023-07-14 (×2): 7 ug/min via INTRAVENOUS
  Filled 2023-07-13 (×2): qty 250

## 2023-07-13 MED ORDER — POLYETHYLENE GLYCOL 3350 17 G PO PACK
17.0000 g | PACK | Freq: Every day | ORAL | Status: DC | PRN
Start: 2023-07-13 — End: 2023-07-18

## 2023-07-13 MED ORDER — STERILE WATER FOR INJECTION IV SOLN
INTRAVENOUS | Status: AC
  Filled 2023-07-13: qty 1000

## 2023-07-13 MED ORDER — DOCUSATE SODIUM 100 MG PO CAPS: 100.0000 mg | ORAL_CAPSULE | Freq: Two times a day (BID) | ORAL | Status: DC | PRN

## 2023-07-13 MED ORDER — CHLORHEXIDINE GLUCONATE CLOTH 2 % EX PADS
6.0000 | MEDICATED_PAD | Freq: Every day | CUTANEOUS | Status: DC
  Administered 2023-07-13 – 2023-07-18 (×5): 6 via TOPICAL

## 2023-07-13 NOTE — Consult Note (Incomplete)
 NAME:  Justin [name], MRN:  [id number], DOB:  06-21-1950, LOS: 0 ADMISSION DATE:  (Not on file), CONSULTATION DATE:  *** REFERRING MD:  ***, CHIEF COMPLAINT:  ***   History of Present Illness:  73 year old male transferred from [facility] for vascular surgery consultation after presenting w/ acute hemorrhagic shock due to bleeding from his dialysis fistula ** initial Hgb: **, the fistula was sutured by EDP and bleeding was controlled. Received ** units of blood. Transferred to [facility] on ** mcg/min norepi.   Pertinent  Medical History  Chronic systolic HF w recovered EF, CAD, type II DM w/ insulin  dependence, ESRD on iHD, hyperlipidemia, aortic stenosis, prior right hip fracture, BPH, HTN  Significant Hospital Events: Including procedures, antibiotic start and stop dates in addition to other pertinent events     Interim History / Subjective:  ***  Objective    There were no vitals taken for this visit. PAP: ()/()      No intake or output data in the 24 hours ending 07/13/23 1712 There were no vitals filed for this visit.  Examination: General: *** HENT: *** Lungs: *** Cardiovascular: *** Abdomen: *** Extremities: *** Neuro: *** GU: ***  Resolved problem list   Assessment and Plan  Hemorrhagic shock w/ acute blood loss anemia 2/2 bleeding AVF -received ** blood -hemostasis achieved by EDP placing suture Plan Vascular surgery consult  Repeat CBC Transfuse for Hgb < 7 or evidence of on-going blood loss  ESRD  -last hd: ** Plan Nephro consult.  F/u chem  Prob will need temp cath depending on how acutely surgery can be done assuming it is needed  CAD w/ HFpEF and moderate aortic stenosis Plan Tele  Hold antihypertensives  Transfuse for blood loss Hold home Norvasc , imdur , toprol  XL,  Resume crestor  when able   Insulin  dep diabetes  Plan Ssi Goal 140-180   Best Practice (right click and Reselect all SmartList Selections daily)   Diet/type: {diet  type:25684} DVT prophylaxis {anticoagulation:25687} Pressure ulcer(s): {pressure ulcer(s):31683} GI prophylaxis: {HP:73065} Lines: {Central Venous Access:25771} Foley:  {Central Venous Access:25691} Code Status:  {Code Status:26939} Last date of multidisciplinary goals of care discussion [***]  Labs   CBC: No results for input(s): WBC, NEUTROABS, HGB, HCT, MCV, PLT in the last 168 hours.  Basic Metabolic Panel: No results for input(s): NA, K, CL, CO2, GLUCOSE, BUN, CREATININE, CALCIUM , MG, PHOS in the last 168 hours. GFR: CrCl cannot be calculated (Patient's most recent lab result is older than the maximum 21 days allowed.). No results for input(s): PROCALCITON, WBC, LATICACIDVEN in the last 168 hours.  Liver Function Tests: No results for input(s): AST, ALT, ALKPHOS, BILITOT, PROT, ALBUMIN  in the last 168 hours. No results for input(s): LIPASE, AMYLASE in the last 168 hours. No results for input(s): AMMONIA in the last 168 hours.  ABG    Component Value Date/Time   PHART 7.389 09/29/2022 1027   PCO2ART 48.3 (H) 09/29/2022 1027   PO2ART 61 (L) 09/29/2022 1027   HCO3 29.2 (H) 09/29/2022 1027   HCO3 29.4 (H) 09/29/2022 1027   TCO2 31 09/29/2022 1027   TCO2 31 09/29/2022 1027   O2SAT 90 09/29/2022 1027   O2SAT 70 09/29/2022 1027     Coagulation Profile: No results for input(s): INR, PROTIME in the last 168 hours.  Cardiac Enzymes: No results for input(s): CKTOTAL, CKMB, CKMBINDEX, TROPONINI in the last 168 hours.  HbA1C: Hgb A1c MFr Bld  Date/Time Value Ref Range Status  02/17/2023  05:59 AM 8.3 (H) 4.8 - 5.6 % Final    Comment:    (NOTE) Pre diabetes:          5.7%-6.4%  Diabetes:              >6.4%  Glycemic control for   <7.0% adults with diabetes   06/05/2022 05:58 PM 6.4 (H) 4.8 - 5.6 % Final    Comment:    (NOTE)         Prediabetes: 5.7 - 6.4         Diabetes: >6.4         Glycemic  control for adults with diabetes: <7.0     CBG: No results for input(s): GLUCAP in the last 168 hours.  Review of Systems:   ***  Past Medical History:  He,  has a past medical history of BPH (benign prostatic hyperplasia), Diabetes (HCC), Dysuria, Erectile dysfunction, ESRD on hemodialysis (HCC), History of chronic CHF (11/19/2019), Hyperlipemia, Hypertension, Personal history of COVID-19 (11/19/2019), Pinna disorder, left, and Wears glasses.   Surgical History:   Past Surgical History:  Procedure Laterality Date   A/V SHUNT INTERVENTION N/A 04/16/2023   Procedure: A/V SHUNT INTERVENTION;  Surgeon: Norine Manuelita LABOR, MD;  [facility]: [facility] INVASIVE CV LAB;  Service: Cardiovascular;  Laterality: N/A;   AV FISTULA PLACEMENT Left 04/30/2018   Procedure: CREATION OF RADIOCEPHALIC ARTERIOVENOUS FISTULA LEFT ARM;  Surgeon: Harvey Carlin BRAVO, MD;  Location: [facility] OR;  Service: Vascular;  Laterality: Left;   AV FISTULA PLACEMENT Left 08/28/2018   Procedure: ARTERIOVENOUS (AV) BRACHIOCEPHALIC FISTULA CREATION LEFT UPPER ARM;  Surgeon: Gretta Lonni PARAS, MD;  Location: [facility] OR;  Service: Vascular;  Laterality: Left;   BIOPSY  02/24/2023   Procedure: BIOPSY;  Surgeon: Federico Rosario BROCKS, MD;  Location: [facility] ENDOSCOPY;  Service: Gastroenterology;;   ESOPHAGOGASTRODUODENOSCOPY (EGD) WITH PROPOFOL  N/A 02/24/2023   Procedure: ESOPHAGOGASTRODUODENOSCOPY (EGD) WITH PROPOFOL ;  Surgeon: Federico Rosario BROCKS, MD;  Location: [facility] ENDOSCOPY;  Service: Gastroenterology;  Laterality: N/A;  Patint is s/p right hip ORIF   INTRAMEDULLARY (IM) NAIL INTERTROCHANTERIC Right 02/17/2023   Procedure: INTRAMEDULLARY (IM) NAIL INTERTROCHANTERIC;  Surgeon: Sherida Adine BROCKS, MD;  Location: [facility] OR;  Service: Orthopedics;  Laterality: Right;   IR FLUORO GUIDE CV LINE RIGHT  04/29/2018   IR US  GUIDE VASC ACCESS RIGHT  04/29/2018   RIGHT/LEFT HEART CATH AND CORONARY ANGIOGRAPHY N/A 05/01/2018   Procedure: RIGHT/LEFT HEART CATH AND CORONARY  ANGIOGRAPHY;  Surgeon: Claudene Victory ORN, MD;  [facility]: [facility] INVASIVE CV LAB;  Service: Cardiovascular;  Laterality: N/A;   RIGHT/LEFT HEART CATH AND CORONARY ANGIOGRAPHY N/A 09/29/2022   Procedure: RIGHT/LEFT HEART CATH AND CORONARY ANGIOGRAPHY;  Surgeon: Wonda Sharper, MD;  Location: [facility] INVASIVE CV LAB;  Service: Cardiovascular;  Laterality: N/A;   subdural hematoma Right 07/2020     Social History:   reports that he has never smoked. He has never used smokeless tobacco. He reports that he does not currently use alcohol . He reports that he does not use drugs.   Family History:  His family history includes Diabetes in his father; Stroke in his mother.   Allergies No Known Allergies   Home Medications  Prior to Admission medications   Medication Sig Start Date End Date Taking? Authorizing Provider  acetaminophen  (TYLENOL ) 500 MG tablet Take 2 tablets (1,000 mg total) by mouth every 6 (six) hours as needed for mild pain (pain score 1-3), fever or headache. 02/23/23   Laurence Locus, DO  amLODipine  (NORVASC ) 10  MG tablet Take 1 tablet (10 mg total) by mouth daily. 05/15/23   [name], [name], MD  bisacodyl  (DULCOLAX) 5 MG EC tablet Take 1 tablet (5 mg total) by mouth daily as needed for moderate constipation. 02/23/23   Laurence Locus, DO  cyanocobalamin  (VITAMIN B12) 1000 MCG tablet Take 1,000 mcg by mouth daily.    [provider]  docusate sodium  (COLACE) 100 MG capsule Take 1 capsule (100 mg total) by mouth 2 (two) times daily. 02/23/23   Laurence Locus, DO  ferric citrate  (AURYXIA ) 1 GM 210 MG(Fe) tablet Take 630 mg by mouth 3 (three) times daily with meals.    [provider]  hydrALAZINE  (APRESOLINE ) 25 MG tablet Take 2 tablets (50 mg total) by mouth See admin instructions. Take 50 mg 3 times daily on M, W, F(dialysis days) Take 50 mg twice daily on Tu, Th, Sat, Sun(non-dialysis days) 02/23/23   Laurence Locus, DO  insulin  aspart (NOVOLOG ) 100 UNIT/ML injection Inject 0-6 Units into the  skin 3 (three) times daily with meals. CBG 70 - 120: 0 units CBG 121 - 150: 0 units CBG 151 - 200: 1 unit CBG 201-250: 2 units CBG 251-300: 3 units CBG 301-350: 4 units CBG 351-400: 5 units CBG > 400: Give 6 units and call MD 02/23/23   Laurence Locus, DO  insulin  glargine-yfgn (SEMGLEE ) 100 UNIT/ML injection Inject 0.12 mLs (12 Units total) into the skin at bedtime. 02/23/23   Laurence Locus, DO  isosorbide  mononitrate (IMDUR ) 30 MG 24 hr tablet Take 1 tablet (30 mg total) by mouth 4 (four) times a week. Mon, Wed, Fri, Sun (non-dialysis days) 10/21/22   Bernie Lamar PARAS, MD  lactulose  (CHRONULAC ) 10 GM/15ML solution Take 30 mLs (20 g total) by mouth 2 (two) times daily. 02/23/23   Laurence Locus, DO  Methoxy PEG-Epoetin Beta (MIRCERA IJ) Inject 1 Dose into the skin as directed. 09/12/22 09/11/23  [provider]  metoCLOPramide  (REGLAN ) 10 MG tablet Take 10 mg by mouth every 6 (six) hours as needed for nausea or vomiting.    [provider]  metoprolol  succinate (TOPROL -XL) 50 MG 24 hr tablet Take 50 mg by mouth daily.    [provider]  multivitamin (RENA-VIT) TABS tablet Take 1 tablet by mouth at bedtime. 02/23/23   Laurence Locus, DO  nitroGLYCERIN  (NITROSTAT ) 0.4 MG SL tablet Place 0.4 mg under the tongue every 5 (five) minutes as needed for chest pain.    [provider]  ondansetron  (ZOFRAN ) 4 MG tablet Take 1 tablet (4 mg total) by mouth every 6 (six) hours as needed for nausea or vomiting. 02/23/23 02/23/24  Laurence Locus, DO  pantoprazole  (PROTONIX ) 40 MG tablet Take 1 tablet (40 mg total) by mouth 2 (two) times daily. 02/26/23   Raenelle Coria, MD  rosuvastatin  (CRESTOR ) 40 MG tablet Take 40 mg by mouth daily. 01/22/22   [provider]  simethicone  (MYLICON) 80 MG chewable tablet Chew 2 tablets (160 mg total) by mouth every 6 (six) hours as needed for flatulence. 02/23/23   Laurence Locus, DO  traZODone  (DESYREL ) 100 MG tablet Take 1 tablet (100 mg total) by mouth at  bedtime as needed for sleep. 02/26/23   Raenelle Coria, MD     Critical care time: ***

## 2023-07-13 NOTE — Consult Note (Signed)
 Vascular and Vein Specialist of   Patient name: Justin [name] MRN: [id number] DOB: 09-16-1950 Sex: male   REQUESTING PROVIDER:   ICU   REASON FOR CONSULT:    Bleeding from dialysis access  HISTORY OF PRESENT ILLNESS:   Justin [name] is a 73 y.o. male, who is status post left brachiocephalic fistula by Dr. Gretta in August 2020.  He recently underwent a fistulogram by Dr. Norine in April 2025 and was found to have a stent edge stenosis that was treated with a 9 mm balloon.  The patient had significant bleeding from his access site earlier today and presented to [facility].  This was sutured closed by the ED and the bleeding was controlled.  The patient suffers from chronic systolic heart failure.  He has coronary artery disease and type 2 diabetes.  He is on dialysis Tuesday Thursday Saturday.  He has aortic stenosis.  He takes a statin for hypercholesterolemia.  He is a non-smoker.  PAST MEDICAL HISTORY    Past Medical History:  Diagnosis Date   BPH (benign prostatic hyperplasia)    Diabetes (HCC)    Dysuria    Erectile dysfunction    ESRD on hemodialysis (HCC)    History of chronic CHF 11/19/2019   Hyperlipemia    Hypertension    Personal history of COVID-19 11/19/2019   Pinna disorder, left    Wears glasses      FAMILY HISTORY   Family History  Problem Relation Age of Onset   Stroke Mother    Diabetes Father     SOCIAL HISTORY:   Social History   Socioeconomic History   Marital status: Divorced    Spouse name: Not on file   Number of children: 1   Years of education: Not on file   Highest education level: Not on file  Occupational History   Not on file  Tobacco Use   Smoking status: Never   Smokeless tobacco: Never  Vaping Use   Vaping status: Never Used  Substance and Sexual Activity   Alcohol  use: Not Currently   Drug use: Never   Sexual activity: Not Currently  Other Topics Concern   Not on  file  Social History Narrative   Not on file   Social Drivers of Health   Financial Resource Strain: Low Risk  (07/13/2020)   Received from [facility]   Overall Financial Resource Strain (CARDIA)    Difficulty of Paying Living Expenses: Not very hard  Food Insecurity: No Food Insecurity (02/17/2023)   Hunger Vital Sign    Worried About Running Out of Food in the Last Year: Never true    Ran Out of Food in the Last Year: Never true  Transportation Needs: No Transportation Needs (02/17/2023)   PRAPARE - Administrator, Civil Service (Medical): No    Lack of Transportation (Non-Medical): No  Physical Activity: Not on file  Stress: Not on file  Social Connections: Socially Integrated (02/17/2023)   Social Connection and Isolation Panel    Frequency of Communication with Friends and Family: More than three times a week    Frequency of Social Gatherings with Friends and Family: More than three times a week    Attends Religious Services: More than 4 times per year    Active Member of [employer] or Organizations: Yes    Attends Banker Meetings: 1 to 4 times per year    Marital Status: Living with partner  Intimate Partner  Violence: Not At Risk (02/17/2023)   Humiliation, Afraid, Rape, and Kick questionnaire    Fear of Current or Ex-Partner: No    Emotionally Abused: No    Physically Abused: No    Sexually Abused: No    ALLERGIES:    No Known Allergies  CURRENT MEDICATIONS:    Current Facility-Administered Medications  Medication Dose Route Frequency Provider Last Rate Last Admin   Chlorhexidine  Gluconate Cloth 2 % PADS 6 each  6 each Topical Daily Dahal, Binaya, MD   6 each at 07/13/23 1915   docusate sodium  (COLACE) capsule 100 mg  100 mg Oral BID PRN Gleason, Leita SAUNDERS, PA-C       insulin  aspart (novoLOG ) injection 0-6 Units  0-6 Units Subcutaneous Q4H Gleason, Leita SAUNDERS, PA-C       norepinephrine  (LEVOPHED ) 4mg  in (0.016 mg/mL) premix infusion  0-40  mcg/min Intravenous Titrated Gleason, Leita SAUNDERS, PA-C 26.3 mL/hr at 07/13/23 2101 7 mcg/min at 07/13/23 2101   polyethylene glycol (MIRALAX  / GLYCOLAX ) packet 17 g  17 g Oral Daily PRN Gleason, Leita SAUNDERS, PA-C        REVIEW OF SYSTEMS:   [X]  denotes positive finding, [ ]  denotes negative finding Cardiac  Comments:  Chest pain or chest pressure:    Shortness of breath upon exertion:    Short of breath when lying flat:    Irregular heart rhythm:        Vascular    Pain in calf, thigh, or hip brought on by ambulation:    Pain in feet at night that wakes you up from your sleep:     Blood clot in your veins:    Leg swelling:         Pulmonary    Oxygen  at home:    Productive cough:     Wheezing:         Neurologic    Sudden weakness in arms or legs:     Sudden numbness in arms or legs:     Sudden onset of difficulty speaking or slurred speech:    Temporary loss of vision in one eye:     Problems with dizziness:         Gastrointestinal    Blood in stool:      Vomited blood:         Genitourinary    Burning when urinating:     Blood in urine:        Psychiatric    Major depression:         Hematologic    Bleeding problems:    Problems with blood clotting too easily:        Skin    Rashes or ulcers:        Constitutional    Fever or chills:     PHYSICAL EXAM:   Vitals:   07/13/23 1945 07/13/23 2000 07/13/23 2015 07/13/23 2030  BP: (!) 108/45 (!) 116/47  (!) 108/47  Pulse: (!) 52 (!) 54 (!) 52 (!) 54  Resp: 12 11 14 13   Temp: (!) 97.4 F (36.3 C)     TempSrc: Axillary     SpO2: 100% 100% 99% 100%    GENERAL: The patient is a well-nourished male, in no acute distress. The vital signs are documented above. CARDIAC: There is a regular rate and rhythm.  VASCULAR: Palpable thrill within left brachiocephalic fistula.  Suture site looks good.  There is no ulcer.  He does not have any upper  extremity edema PULMONARY: Nonlabored respirations MUSCULOSKELETAL: There are  no major deformities or cyanosis. NEUROLOGIC: No focal weakness or paresthesias are detected. SKIN: There are no ulcers or rashes noted. PSYCHIATRIC: The patient has a normal affect.  STUDIES:     ASSESSMENT and PLAN   ESRD: The patient initially presented with hemorrhagic shock from his cannulation site of his fistula.  He states this has happened before.  He has a history of stenting of his fistula and recent angioplasty of a stent edge stenosis.  On exam there is a good thrill within the fistula.  He is scheduled to go to dialysis tomorrow.  There is no current bleeding.  I suspect that he has developed a recurrent stenosis.  This would warrant a fistulogram which would be scheduled for early next week.  His access can be utilized in the interim.   Malvina Serene CLORE, MD, FACS Vascular and Vein Specialists of [facility] [phone] Pager [phone]

## 2023-07-13 NOTE — Progress Notes (Signed)
 eLink Physician-Brief Progress Note Patient Name: Justin [name] DOB: [birth year] MRN: [id number]   Date of Service  07/13/2023  HPI/Events of Note  [name] is a 73 y.o. M with ESRD on dialysis, CHF, AS, type 2 DM who has presented to [facility] with significant bleeding from his LUE AV fistula.  Over the last three weeks he has had bleeding at every dialysis session.  It began spontaneously bleeding around 5 am.   Pt's significant other had to hold pressure and patient became lethargic, therefore she called EMS.  In the ED he was transfused 2 units PRBC's his Hgb was 9, a stitch was placed in the fistula with cessation of bleeding. He remained hypotensive despite transfusion, so was transferred to the ICU for critical care and vascular surgery evaluation.   He was noted recently to have diarrhea and his WBC count was elevated.  Concern raised also for presence of septic shock.    eICU Interventions  Chart reviewed     Intervention Category Evaluation Type: New Patient Evaluation  CLAUDENE AGENT, P 07/13/2023, 11:19 PM

## 2023-07-13 NOTE — H&P (Signed)
 NAME:  Justin [name], MRN:  [id number], DOB:  1950-12-05, LOS: 0 ADMISSION DATE:  07/13/2023, CONSULTATION DATE:  07/13/23 REFERRING MD:  Raynaldo ED, CHIEF COMPLAINT:  hemorrhagic shock   History of Present Illness:  Justin [name] is a 73 y.o. M with PMH significant for ESRD on dialysis T/Th/Sat, CHF, TCAD, AS, ype 2 DM who has presented to [facility] with significant bleeding from his LUE AV fistula.  He states over the last three weeks he has had bleeding at every dialysis session.  He had a fall today, but says this was a slide from sitting and he did not hit his arm.  It began spontaneously bleeding around 5 am, Pt's significant other had to hold pressure and patient became lethargic, therefore she called EMS.   In the ED he was transfused prior to labs, but after 2 units PRBC's his Hgb was 9, a stitch was placed in the fistula with cessation of bleeding.  He noted the onset of diarrhea today as well, this is dark, but he has had dark stools since starting iron  supplementation. He had a WBC of 21k, denies any recent fevers, chills, nausea or vomiting.  He had some mild abdominal pain today.  A CT head and CTA abd/pelvis did not show any signs of trauma or source of bleeding.  He remained hypotensive despite transfusion, so was transferred to the ICU for critical care and vascular surgery evaluation.  Of note he was admitted in 04/2023 and had a fistulogram and angioplasty of the fistula due to stenosis.   Pertinent  Medical History   has a past medical history of BPH (benign prostatic hyperplasia), Diabetes (HCC), Dysuria, Erectile dysfunction, ESRD on hemodialysis (HCC), History of chronic CHF (11/19/2019), Hyperlipemia, Hypertension, Personal history of COVID-19 (11/19/2019), Pinna disorder, left, and Wears glasses.   Significant Hospital Events: Including procedures, antibiotic start and stop dates in addition to other pertinent events   7/11 transfer from [place] after AV fistula  bleed  Interim History / Subjective:  Arrived to the ICU awake and alert on Levophed   Objective    Blood pressure (!) 108/45, pulse (!) 52, temperature (!) 97.4 F (36.3 C), temperature source Axillary, resp. rate 12, SpO2 100%.       No intake or output data in the 24 hours ending 07/13/23 2035 There were no vitals filed for this visit.  General:  chronically ill-appearing M resting in bed in NAD HEENT: MM pink/moist Neuro: alert and oriented, following commands  CV: s1s2 rrr, no m/r/g PULM:  clear bilaterally on RA GI: soft, non-tender and non-distended  Extremities: cool/dry, no edema or skin mottling the AV fistula in the LUE is dressed and not bleeding     Resolved problem list   Assessment and Plan   Hemorrhagic Shock with possible concurrent septic shock Acute Diarrhea Bleeding AV fistula Angioplasty of the L AV fistula in April due to significant stenosis He was transfused and Hgb 9, but is still requiring pressor support and has a leukocytosis and new onset diarrhea  -check labs now and trend Hgb in the AM -type and screen and coags -Norepi to maintain MAP >65 -check lactic acid and Bcx2, send C. Diff and GI pathogen panel -Vascular will evaluate, NPO at midnight   ESRD T/Th/Sat dialysis -dialyzed 7/10 -nephrology consult in the AM   HTN HFpEF CAD Severe AS -hold home bb and hydralazine   -echo 08/2022 with severe AS, LVH and preserved EF    Type 2  DM -SSI for now as NPO, may need Semglee       Best Practice (right click and Reselect all SmartList Selections daily)   Diet/type: NPO DVT prophylaxis SCD Pressure ulcer(s): N/A GI prophylaxis: N/A Lines: Central line Foley:  N/A Code Status:  full code Last date of multidisciplinary goals of care discussion [Pt and significant other updated at the bedside]  Labs   CBC: No results for input(s): WBC, NEUTROABS, HGB, HCT, MCV, PLT in the last 168 hours.  Basic Metabolic  Panel: No results for input(s): NA, K, CL, CO2, GLUCOSE, BUN, CREATININE, CALCIUM , MG, PHOS in the last 168 hours. GFR: CrCl cannot be calculated (Patient's most recent lab result is older than the maximum 21 days allowed.). No results for input(s): PROCALCITON, WBC, LATICACIDVEN in the last 168 hours.  Liver Function Tests: No results for input(s): AST, ALT, ALKPHOS, BILITOT, PROT, ALBUMIN  in the last 168 hours. No results for input(s): LIPASE, AMYLASE in the last 168 hours. No results for input(s): AMMONIA in the last 168 hours.  ABG    Component Value Date/Time   PHART 7.389 09/29/2022 1027   PCO2ART 48.3 (H) 09/29/2022 1027   PO2ART 61 (L) 09/29/2022 1027   HCO3 29.2 (H) 09/29/2022 1027   HCO3 29.4 (H) 09/29/2022 1027   TCO2 31 09/29/2022 1027   TCO2 31 09/29/2022 1027   O2SAT 90 09/29/2022 1027   O2SAT 70 09/29/2022 1027     Coagulation Profile: No results for input(s): INR, PROTIME in the last 168 hours.  Cardiac Enzymes: No results for input(s): CKTOTAL, CKMB, CKMBINDEX, TROPONINI in the last 168 hours.  HbA1C: Hgb A1c MFr Bld  Date/Time Value Ref Range Status  02/17/2023 05:59 AM 8.3 (H) 4.8 - 5.6 % Final    Comment:    (NOTE) Pre diabetes:          5.7%-6.4%  Diabetes:              >6.4%  Glycemic control for   <7.0% adults with diabetes   06/05/2022 05:58 PM 6.4 (H) 4.8 - 5.6 % Final    Comment:    (NOTE)         Prediabetes: 5.7 - 6.4         Diabetes: >6.4         Glycemic control for adults with diabetes: <7.0     CBG: Recent Labs  Lab 07/13/23 1929  GLUCAP 310*    Review of Systems:   Please see the history of present illness. All other systems reviewed and are negative    Past Medical History:  He,  has a past medical history of BPH (benign prostatic hyperplasia), Diabetes (HCC), Dysuria, Erectile dysfunction, ESRD on hemodialysis (HCC), History of chronic CHF (11/19/2019),  Hyperlipemia, Hypertension, Personal history of COVID-19 (11/19/2019), Pinna disorder, left, and Wears glasses.   Surgical History:   Past Surgical History:  Procedure Laterality Date   A/V SHUNT INTERVENTION N/A 04/16/2023   Procedure: A/V SHUNT INTERVENTION;  Surgeon: Norine Manuelita LABOR, MD;  Location: [facility] INVASIVE CV LAB;  Service: Cardiovascular;  Laterality: N/A;   AV FISTULA PLACEMENT Left 04/30/2018   Procedure: CREATION OF RADIOCEPHALIC ARTERIOVENOUS FISTULA LEFT ARM;  Surgeon: Harvey Carlin BRAVO, MD;  Location: [facility] OR;  Service: Vascular;  Laterality: Left;   AV FISTULA PLACEMENT Left 08/28/2018   Procedure: ARTERIOVENOUS (AV) BRACHIOCEPHALIC FISTULA CREATION LEFT UPPER ARM;  Surgeon: Gretta Lonni PARAS, MD;  Location: [facility] OR;  Service: Vascular;  Laterality: Left;   BIOPSY  02/24/2023   Procedure: BIOPSY;  Surgeon: Federico Rosario BROCKS, MD;  Location: [facility] ENDOSCOPY;  Service: Gastroenterology;;   ESOPHAGOGASTRODUODENOSCOPY (EGD) WITH PROPOFOL  N/A 02/24/2023   Procedure: ESOPHAGOGASTRODUODENOSCOPY (EGD) WITH PROPOFOL ;  Surgeon: Federico Rosario BROCKS, MD;  Location: [facility] ENDOSCOPY;  Service: Gastroenterology;  Laterality: N/A;  Patint is s/p right hip ORIF   INTRAMEDULLARY (IM) NAIL INTERTROCHANTERIC Right 02/17/2023   Procedure: INTRAMEDULLARY (IM) NAIL INTERTROCHANTERIC;  Surgeon: Sherida Adine BROCKS, MD;  Location: [facility] OR;  Service: Orthopedics;  Laterality: Right;   IR FLUORO GUIDE CV LINE RIGHT  04/29/2018   IR US  GUIDE VASC ACCESS RIGHT  04/29/2018   RIGHT/LEFT HEART CATH AND CORONARY ANGIOGRAPHY N/A 05/01/2018   Procedure: RIGHT/LEFT HEART CATH AND CORONARY ANGIOGRAPHY;  Surgeon: Claudene Victory ORN, MD;  Location: [facility] INVASIVE CV LAB;  Service: Cardiovascular;  Laterality: N/A;   RIGHT/LEFT HEART CATH AND CORONARY ANGIOGRAPHY N/A 09/29/2022   Procedure: RIGHT/LEFT HEART CATH AND CORONARY ANGIOGRAPHY;  Surgeon: Wonda Sharper, MD;  Location: [facility] INVASIVE CV LAB;  Service: Cardiovascular;  Laterality: N/A;    subdural hematoma Right 07/2020     Social History:   reports that he has never smoked. He has never used smokeless tobacco. He reports that he does not currently use alcohol . He reports that he does not use drugs.   Family History:  His family history includes Diabetes in his father; Stroke in his mother.   Allergies No Known Allergies   Home Medications  Prior to Admission medications   Medication Sig Start Date End Date Taking? Authorizing Provider  acetaminophen  (TYLENOL ) 500 MG tablet Take 2 tablets (1,000 mg total) by mouth every 6 (six) hours as needed for mild pain (pain score 1-3), fever or headache. 02/23/23   Laurence Locus, DO  amLODipine  (NORVASC ) 10 MG tablet Take 1 tablet (10 mg total) by mouth daily. 05/15/23   [name], [name], MD  bisacodyl  (DULCOLAX) 5 MG EC tablet Take 1 tablet (5 mg total) by mouth daily as needed for moderate constipation. 02/23/23   Laurence Locus, DO  cyanocobalamin  (VITAMIN B12) 1000 MCG tablet Take 1,000 mcg by mouth daily.    [provider]  docusate sodium  (COLACE) 100 MG capsule Take 1 capsule (100 mg total) by mouth 2 (two) times daily. 02/23/23   Laurence Locus, DO  ferric citrate  (AURYXIA ) 1 GM 210 MG(Fe) tablet Take 630 mg by mouth 3 (three) times daily with meals.    [provider]  hydrALAZINE  (APRESOLINE ) 25 MG tablet Take 2 tablets (50 mg total) by mouth See admin instructions. Take 50 mg 3 times daily on M, W, F(dialysis days) Take 50 mg twice daily on Tu, Th, Sat, Sun(non-dialysis days) 02/23/23   Laurence Locus, DO  insulin  aspart (NOVOLOG ) 100 UNIT/ML injection Inject 0-6 Units into the skin 3 (three) times daily with meals. CBG 70 - 120: 0 units CBG 121 - 150: 0 units CBG 151 - 200: 1 unit CBG 201-250: 2 units CBG 251-300: 3 units CBG 301-350: 4 units CBG 351-400: 5 units CBG > 400: Give 6 units and call MD 02/23/23   Laurence Locus, DO  insulin  glargine-yfgn (SEMGLEE ) 100 UNIT/ML injection Inject 0.12 mLs (12 Units total) into the  skin at bedtime. 02/23/23   Laurence Locus, DO  isosorbide  mononitrate (IMDUR ) 30 MG 24 hr tablet Take 1 tablet (30 mg total) by mouth 4 (four) times a week. Mon, Wed, Fri, Sun (non-dialysis days) 10/21/22   [name], [name], MD  lactulose  (CHRONULAC ) 10 GM/15ML solution  Take 30 mLs (20 g total) by mouth 2 (two) times daily. 02/23/23   Laurence Locus, DO  Methoxy PEG-Epoetin Beta (MIRCERA IJ) Inject 1 Dose into the skin as directed. 09/12/22 09/11/23  [provider]  metoCLOPramide  (REGLAN ) 10 MG tablet Take 10 mg by mouth every 6 (six) hours as needed for nausea or vomiting.    [provider]  metoprolol  succinate (TOPROL -XL) 50 MG 24 hr tablet Take 50 mg by mouth daily.    [provider]  multivitamin (RENA-VIT) TABS tablet Take 1 tablet by mouth at bedtime. 02/23/23   Laurence Locus, DO  nitroGLYCERIN  (NITROSTAT ) 0.4 MG SL tablet Place 0.4 mg under the tongue every 5 (five) minutes as needed for chest pain.    [provider]  ondansetron  (ZOFRAN ) 4 MG tablet Take 1 tablet (4 mg total) by mouth every 6 (six) hours as needed for nausea or vomiting. 02/23/23 02/23/24  Laurence Locus, DO  pantoprazole  (PROTONIX ) 40 MG tablet Take 1 tablet (40 mg total) by mouth 2 (two) times daily. 02/26/23   Raenelle Coria, MD  rosuvastatin  (CRESTOR ) 40 MG tablet Take 40 mg by mouth daily. 01/22/22   [provider]  simethicone  (MYLICON) 80 MG chewable tablet Chew 2 tablets (160 mg total) by mouth every 6 (six) hours as needed for flatulence. 02/23/23   Laurence Locus, DO  traZODone  (DESYREL ) 100 MG tablet Take 1 tablet (100 mg total) by mouth at bedtime as needed for sleep. 02/26/23   Raenelle Coria, MD     Critical care time:  55 minutes       CRITICAL CARE Performed by: Leita SAUNDERS Apollos Tenbrink   Total critical care time: 55 minutes  Critical care time was exclusive of separately billable procedures and treating other patients.  Critical care was necessary to treat or prevent imminent or  life-threatening deterioration.  Critical care was time spent personally by me on the following activities: development of treatment plan with patient and/or surrogate as well as nursing, discussions with consultants, evaluation of patient's response to treatment, examination of patient, obtaining history from patient or surrogate, ordering and performing treatments and interventions, ordering and review of laboratory studies, ordering and review of radiographic studies, pulse oximetry and re-evaluation of patient's condition.  Leita SAUNDERS Kikue Gerhart, PA-C [facility] See Amion for pager If no response to pager , please call [phone] until 7pm After 7:00 pm call Elink  [phone]

## 2023-07-14 DIAGNOSIS — Z992 Dependence on renal dialysis: Secondary | ICD-10-CM | POA: Diagnosis not present

## 2023-07-14 DIAGNOSIS — R571 Hypovolemic shock: Secondary | ICD-10-CM | POA: Diagnosis not present

## 2023-07-14 DIAGNOSIS — E8721 Acute metabolic acidosis: Secondary | ICD-10-CM | POA: Diagnosis not present

## 2023-07-14 DIAGNOSIS — N186 End stage renal disease: Secondary | ICD-10-CM | POA: Diagnosis not present

## 2023-07-14 LAB — CBC
HCT: 23.1 % — ABNORMAL LOW (ref 39.0–52.0)
Hemoglobin: 7.9 g/dL — ABNORMAL LOW (ref 13.0–17.0)
MCH: 29.3 pg (ref 26.0–34.0)
MCHC: 34.2 g/dL (ref 30.0–36.0)
MCV: 85.6 fL (ref 80.0–100.0)
Platelets: 206 K/uL (ref 150–400)
RBC: 2.7 MIL/uL — ABNORMAL LOW (ref 4.22–5.81)
RDW: 16.2 % — ABNORMAL HIGH (ref 11.5–15.5)
WBC: 14.8 K/uL — ABNORMAL HIGH (ref 4.0–10.5)
nRBC: 0 % (ref 0.0–0.2)

## 2023-07-14 LAB — COMPREHENSIVE METABOLIC PANEL WITH GFR
ALT: 135 U/L — ABNORMAL HIGH (ref 0–44)
AST: 145 U/L — ABNORMAL HIGH (ref 15–41)
Albumin: 2.2 g/dL — ABNORMAL LOW (ref 3.5–5.0)
Alkaline Phosphatase: 61 U/L (ref 38–126)
Anion gap: 12 (ref 5–15)
BUN: 58 mg/dL — ABNORMAL HIGH (ref 8–23)
CO2: 30 mmol/L (ref 22–32)
Calcium: 7.5 mg/dL — ABNORMAL LOW (ref 8.9–10.3)
Chloride: 92 mmol/L — ABNORMAL LOW (ref 98–111)
Creatinine, Ser: 10.38 mg/dL — ABNORMAL HIGH (ref 0.61–1.24)
GFR, Estimated: 5 mL/min — ABNORMAL LOW (ref >60)
Glucose, Bld: 274 mg/dL — ABNORMAL HIGH (ref 70–99)
Potassium: 3.8 mmol/L (ref 3.5–5.1)
Sodium: 134 mmol/L — ABNORMAL LOW (ref 135–145)
Total Bilirubin: 0.6 mg/dL (ref 0.0–1.2)
Total Protein: 4.9 g/dL — ABNORMAL LOW (ref 6.5–8.1)

## 2023-07-14 LAB — GLUCOSE, CAPILLARY
Glucose-Capillary: 243 mg/dL — ABNORMAL HIGH (ref 70–99)
Glucose-Capillary: 184 mg/dL — ABNORMAL HIGH (ref 70–99)
Glucose-Capillary: 160 mg/dL — ABNORMAL HIGH (ref 70–99)
Glucose-Capillary: 133 mg/dL — ABNORMAL HIGH (ref 70–99)
Glucose-Capillary: 127 mg/dL — ABNORMAL HIGH (ref 70–99)
Glucose-Capillary: 130 mg/dL — ABNORMAL HIGH (ref 70–99)
Glucose-Capillary: 127 mg/dL — ABNORMAL HIGH (ref 70–99)
Glucose-Capillary: 128 mg/dL — ABNORMAL HIGH (ref 70–99)
Glucose-Capillary: 125 mg/dL — ABNORMAL HIGH (ref 70–99)
Glucose-Capillary: 131 mg/dL — ABNORMAL HIGH (ref 70–99)

## 2023-07-14 LAB — MAGNESIUM: Magnesium: 2.1 mg/dL (ref 1.7–2.4)

## 2023-07-14 LAB — LACTIC ACID, PLASMA: Lactic Acid, Venous: 3.1 mmol/L (ref 0.5–1.9)

## 2023-07-14 LAB — BETA-HYDROXYBUTYRIC ACID: Beta-Hydroxybutyric Acid: 0.15 mmol/L (ref 0.05–0.27)

## 2023-07-14 LAB — PHOSPHORUS: Phosphorus: 7.3 mg/dL — ABNORMAL HIGH (ref 2.5–4.6)

## 2023-07-14 MED ORDER — SODIUM BICARBONATE 8.4 % IV SOLN
INTRAVENOUS | Status: AC
  Filled 2023-07-14: qty 200

## 2023-07-14 MED ORDER — ACETAMINOPHEN 325 MG PO TABS
650.0000 mg | ORAL_TABLET | Freq: Four times a day (QID) | ORAL | Status: DC | PRN
  Administered 2023-07-14 – 2023-07-18 (×2): 650 mg via ORAL
  Filled 2023-07-14 (×2): qty 2

## 2023-07-14 MED ORDER — CHLORHEXIDINE GLUCONATE CLOTH 2 % EX PADS
6.0000 | MEDICATED_PAD | Freq: Every day | CUTANEOUS | Status: DC
  Administered 2023-07-15: 6 via TOPICAL

## 2023-07-14 MED ORDER — INSULIN ASPART 100 UNIT/ML IJ SOLN
2.0000 [IU] | Freq: Three times a day (TID) | INTRAMUSCULAR | Status: DC
  Administered 2023-07-14 – 2023-07-18 (×10): 2 [IU] via SUBCUTANEOUS

## 2023-07-14 MED ORDER — DEXTROSE 50 % IV SOLN: 0.0000 mL | INTRAVENOUS | Status: DC | PRN

## 2023-07-14 MED ORDER — INSULIN REGULAR(HUMAN) IN NACL 100-0.9 UT/100ML-% IV SOLN
INTRAVENOUS | Status: DC
  Administered 2023-07-14: 3.8 [IU]/h via INTRAVENOUS
  Filled 2023-07-14: qty 100

## 2023-07-14 MED ORDER — INSULIN ASPART 100 UNIT/ML IJ SOLN
0.0000 [IU] | Freq: Three times a day (TID) | INTRAMUSCULAR | Status: DC
  Administered 2023-07-14 – 2023-07-16 (×6): 2 [IU] via SUBCUTANEOUS
  Administered 2023-07-17: 5 [IU] via SUBCUTANEOUS
  Administered 2023-07-17: 3 [IU] via SUBCUTANEOUS
  Administered 2023-07-18: 2 [IU] via SUBCUTANEOUS
  Administered 2023-07-18: 3 [IU] via SUBCUTANEOUS

## 2023-07-14 MED ORDER — OXYCODONE-ACETAMINOPHEN 5-325 MG PO TABS
1.0000 | ORAL_TABLET | Freq: Four times a day (QID) | ORAL | Status: DC | PRN
  Administered 2023-07-14 – 2023-07-18 (×10): 1 via ORAL
  Filled 2023-07-14 (×10): qty 1

## 2023-07-14 MED ORDER — INSULIN ASPART 100 UNIT/ML IJ SOLN: 0.0000 [IU] | Freq: Every day | INTRAMUSCULAR | Status: DC

## 2023-07-14 MED ORDER — INSULIN GLARGINE-YFGN 100 UNIT/ML ~~LOC~~ SOLN
6.0000 [IU] | Freq: Every day | SUBCUTANEOUS | Status: DC
  Administered 2023-07-14 – 2023-07-17 (×4): 6 [IU] via SUBCUTANEOUS
  Filled 2023-07-14 (×5): qty 0.06

## 2023-07-14 NOTE — Progress Notes (Signed)
 NAME:  Justin [name], MRN:  [id number], DOB:  1950-10-14, LOS: 1 ADMISSION DATE:  07/13/2023, CONSULTATION DATE:  07/14/23 REFERRING MD:  [facility], CHIEF COMPLAINT:  hemorrhagic shock   History of Present Illness:  Justin [name] is a 73 y.o. M with PMH significant for ESRD on dialysis T/Th/Sat, CHF, TCAD, AS, ype 2 DM who has presented to [facility] with significant bleeding from his LUE AV fistula.  He states over the last three weeks he has had bleeding at every dialysis session.  He had a fall today, but says this was a slide from sitting and he did not hit his arm.  It began spontaneously bleeding around 5 am, Pt's significant other had to hold pressure and patient became lethargic, therefore she called EMS.   In the ED he was transfused prior to labs, but after 2 units PRBC's his Hgb was 9, a stitch was placed in the fistula with cessation of bleeding.  He noted the onset of diarrhea today as well, this is dark, but he has had dark stools since starting iron  supplementation. He had a WBC of 21k, denies any recent fevers, chills, nausea or vomiting.  He had some mild abdominal pain today.  A CT head and CTA abd/pelvis did not show any signs of trauma or source of bleeding.  He remained hypotensive despite transfusion, so was transferred to the ICU for critical care and vascular surgery evaluation.  Of note he was admitted in 04/2023 and had a fistulogram and angioplasty of the fistula due to stenosis.   Pertinent  Medical History   has a past medical history of BPH (benign prostatic hyperplasia), Diabetes (HCC), Dysuria, Erectile dysfunction, ESRD on hemodialysis (HCC), History of chronic CHF (11/19/2019), Hyperlipemia, Hypertension, Personal history of COVID-19 (11/19/2019), Pinna disorder, left, and Wears glasses.   Significant Hospital Events: Including procedures, antibiotic start and stop dates in addition to other pertinent events   7/11 transfer from [place] after AV fistula  bleed  Interim History / Subjective:  Arrived to the ICU awake and alert on Levophed   Objective    Blood pressure (!) 124/50, pulse 63, temperature 98.3 F (36.8 C), temperature source Oral, resp. rate 13, SpO2 99%.        Intake/Output Summary (Last 24 hours) at 07/14/2023 0932 Last data filed at 07/14/2023 0800 Gross per 24 hour  Intake 857.85 ml  Output --  Net 857.85 ml   There were no vitals filed for this visit.  General:  chronically ill-appearing M resting in bed in NAD HEENT: MM pink/moist Neuro: alert and oriented, following commands  CV: s1s2 rrr, no m/r/g PULM:  clear bilaterally on RA GI: soft, non-tender and non-distended  Extremities: cool/dry, no edema or skin mottling the AV fistula in the LUE is dressed and not bleeding     Resolved problem list   Assessment and Plan   Hemorrhagic Shock with possible concurrent septic shock, no clear septic source Bleeding AV fistula Dark stools, on iron  supplementation, unclear significance Angioplasty of the L AV fistula in April due to significant stenosis He was transfused and Hgb 9, but requiring pressor support and has a leukocytosis and new onset diarrhea  -Follow CBC, fistula status closely.  No active bleeding evident, Hgb 8.0 > 7.9 -INR 1.6, follow intermittently - Wean norepinephrine  as able, MAP goal 65 - Follow lactic acid trend, 3.3 > 3.1 - Blood cultures sent, pending - C. difficile and GI panel ordered but no stools yet to  send, should be able to discontinue the C. difficile testing -Appreciate VVS evaluation.  Suspect recurrent stenosis and planning for fistulogram beginning of the week.  No acute intervention planned so should be able to start a diet.  Okay to use the fistula for HD in the interim  ESRD Anion gap metabolic acidosis, likely principally due to his renal status.  Considered also contribution mild DKA but ketones negative T/Th/Sat dialysis, missed his HD 7/10 -Notified nephrology  of admission.  They will assess and decide timing of next HD session - Probably HD on 7/12 if hemodynamically stable to do so - Follow BMP closely - Will allow bicarbonate drip to expire this morning.  No clear indication for this unless we have to defer his HD - Check beta hydroxybutyrate for completeness  Acute Diarrhea Acute LLQ pain Question GI bleeding, dark stools (on iron ) -No overt bleeding noted by patient, will continue surveillance but suspicion for GI bleeding as explanation for his hemorrhagic shock low given the issues with his fistula -Diarrhea appears to have improved or at least has not happened since he has been here.  Stool studies pending.  Will discontinue C. difficile as it appears diarrhea has improved.  Reconsider if recurs -Consider CT abdomen and setting of on explained left lower quadrant/left flank pain -Possible GI bleeding, dark stools reported.  Will discuss with GI once acute issues stabilize, determine appropriate workup  Diabetes type 2 on outpatient insulin  with hyperglycemia -Insulin  infusion started overnight, better control and weaning.  Goal to off with long-acting coverage. -May need to supplement his usual home insulin  dosing in the setting of critical illness -Beta hydroxybutyrate pending  HTN HFpEF CAD Severe AS (08/2022 TTE) -Home hydralazine  and beta-blocker on hold    Best Practice (right click and Reselect all SmartList Selections daily)   Diet/type: Regular consistency (see orders) DVT prophylaxis SCD Pressure ulcer(s): N/A GI prophylaxis: N/A Lines: Central line Foley:  N/A Code Status:  full code Last date of multidisciplinary goals of care discussion [Pt and significant other updated at the bedside 7/12]  Labs   CBC: Recent Labs  Lab 07/13/23 2308 07/14/23 0259  WBC 19.2* 14.8*  HGB 8.0* 7.9*  HCT 24.1* 23.1*  MCV 87.3 85.6  PLT 231 206    Basic Metabolic Panel: Recent Labs  Lab 07/13/23 2308 07/14/23 0259  NA  128* 134*  K 4.4 3.8  CL 94* 92*  CO2 15* 30  GLUCOSE 320* 274*  BUN 58* 58*  CREATININE 10.58* 10.38*  CALCIUM  7.3* 7.5*  MG 2.0 2.1  PHOS 10.3* 7.3*   GFR: CrCl cannot be calculated (Unknown ideal weight.). Recent Labs  Lab 07/13/23 2308 07/14/23 0259  WBC 19.2* 14.8*  LATICACIDVEN 3.3* 3.1*    Liver Function Tests: Recent Labs  Lab 07/13/23 2308 07/14/23 0259  AST 122* 145*  ALT 121* 135*  ALKPHOS 69 61  BILITOT 0.7 0.6  PROT 5.2* 4.9*  ALBUMIN  2.4* 2.2*   No results for input(s): LIPASE, AMYLASE in the last 168 hours. No results for input(s): AMMONIA in the last 168 hours.  ABG    Component Value Date/Time   PHART 7.389 09/29/2022 1027   PCO2ART 48.3 (H) 09/29/2022 1027   PO2ART 61 (L) 09/29/2022 1027   HCO3 29.2 (H) 09/29/2022 1027   HCO3 29.4 (H) 09/29/2022 1027   TCO2 31 09/29/2022 1027   TCO2 31 09/29/2022 1027   O2SAT 90 09/29/2022 1027   O2SAT 70 09/29/2022 1027  Coagulation Profile: Recent Labs  Lab 07/13/23 2308  INR 1.6*    Cardiac Enzymes: No results for input(s): CKTOTAL, CKMB, CKMBINDEX, TROPONINI in the last 168 hours.  HbA1C: Hgb A1c MFr Bld  Date/Time Value Ref Range Status  02/17/2023 05:59 AM 8.3 (H) 4.8 - 5.6 % Final    Comment:    (NOTE) Pre diabetes:          5.7%-6.4%  Diabetes:              >6.4%  Glycemic control for   <7.0% adults with diabetes   06/05/2022 05:58 PM 6.4 (H) 4.8 - 5.6 % Final    Comment:    (NOTE)         Prediabetes: 5.7 - 6.4         Diabetes: >6.4         Glycemic control for adults with diabetes: <7.0     CBG: Recent Labs  Lab 07/13/23 1929 07/13/23 2329 07/14/23 0309 07/14/23 0731 07/14/23 0902  GLUCAP 310* 305* 243* 184* 160*     Critical care time:  33 minutes       CRITICAL CARE Performed by: Lamar GORMAN Chris   Total critical care time: 33 minutes  Critical care time was exclusive of separately billable procedures and treating other  patients.  Critical care was necessary to treat or prevent imminent or life-threatening deterioration.  Critical care was time spent personally by me on the following activities: development of treatment plan with patient and/or surrogate as well as nursing, discussions with consultants, evaluation of patient's response to treatment, examination of patient, obtaining history from patient or surrogate, ordering and performing treatments and interventions, ordering and review of laboratory studies, ordering and review of radiographic studies, pulse oximetry and re-evaluation of patient's condition.  Lamar Chris, MD, PhD 07/14/2023, 9:32 AM [facility] [phone] or if no answer before 7:00PM call [phone] For any issues after 7:00PM please call eLink [phone]

## 2023-07-14 NOTE — Plan of Care (Incomplete)
 Wean norepi off. Bathed.

## 2023-07-14 NOTE — Consult Note (Addendum)
 Renal Service Consult Note [facility]  Justin [name] 07/14/2023 Justin JONETTA Fret, MD Requesting Physician: Dr. Shelah  Reason for Consult: ESRD patient with anemia and bleeding from fistula arm HPI: The patient is a 73 y.o. year-old w/ PMH as below who presented to [facility] yesterday due to bleeding from his left arm AV fistula.  Patient stated he had been having bleeding after every dialysis session over the last 3 weeks.  This episode happened spontaneously around 5 AM yesterday according to the H&P.  Patient's significant other held pressure but the patient became lethargic and EMS was called.  In the ED patient was transfused prior to labs and after 2 units PRBCs his Hgb was 9.  A stitch was placed in the bleeding area of his fistula.  White blood count was 21,000 but patient denied any recent fevers chills nausea or vomiting.  CT head and CTA abdomen pelvis was done (at OSH) without any significant findings.  Patient did have some diarrhea and mild abdominal pain recently.  Of note patient was admitted in April and had a fistulogram w/ angioplasty of a stenotic area.  Patient was transferred to [facility] for admission in the ICU.  Blood pressure was 116/47, HR 54-57, RR 11-14, temp 97.4, 100% sats on room air.  Labs showed sodium 128, BUN 58, creatinine 10.5, calcium  7.3, albumin  2.4, AST 122, ALT 121, total bilirubin 0.7.  Lactic acid was 3.3, repeat was 3.1.  Hemoglobin was 8.0 repeat this morning was 7.9.  White blood count was 19K last evening and 14.8 this morning.  INR was 1.6.  Beta hydroxybutyric acid was 0.15.  This morning and this afternoon blood pressures have been stable in the low 115-140/45-55 range.  Heart rate is 70 and respiratory rate 18.  On room air.  Patient's last dialysis was Thursday.  We are asked to see patient for ESRD   Pt seen in ICU bed.  Patient is pleasant and not in distress.  Patient had dialysis 2 days ago on Thursday, 07/12/2023.  He did not have  any bleeding problems after the dialysis other than slightly prolonged bleeding at the distal aspect of the fistula that he has been having for several weeks (see below).  The following morning the fistula started bleeding about 5 AM.  The patient had said that the bleeding was from the scab coming off, possibly he rubbed his arm on bed causing the scab to come off.  This is the only severe bleeding he has had from the fistula recently.  He states that he has had bleeding problems for the last several weeks but what he means by this is where normally both needle holes will stop bleeding after 5 minutes of pressure, the last 3 weeks, in the more distal aspect of the fistula there has been longer bleeding in the range of 10 to 15 minutes instead of 5 minutes.  There have been no episodes of 20 or 30 or 40 minutes of bleeding from either needle hole.  Otherwise overall patient feels good, goes to all his dialysis sessions, does not over drink fluids.  He feels fine today, no swelling and no shortness of breath.   ROS - denies CP, no joint pain, no HA, no blurry vision, no rash, no diarrhea, no nausea/ vomiting   Past Medical History  Past Medical History:  Diagnosis Date   BPH (benign prostatic hyperplasia)    Diabetes (HCC)    Dysuria    Erectile  dysfunction    ESRD on hemodialysis [facility])    History of chronic CHF 11/19/2019   Hyperlipemia    Hypertension    Personal history of COVID-19 11/19/2019   Pinna disorder, left    Wears glasses    Past Surgical History  Past Surgical History:  Procedure Laterality Date   A/V SHUNT INTERVENTION N/A 04/16/2023   Procedure: A/V SHUNT INTERVENTION;  Surgeon: Norine Manuelita LABOR, MD;  Location: [facility] INVASIVE CV LAB;  Service: Cardiovascular;  Laterality: N/A;   AV FISTULA PLACEMENT Left 04/30/2018   Procedure: CREATION OF RADIOCEPHALIC ARTERIOVENOUS FISTULA LEFT ARM;  Surgeon: Harvey Carlin BRAVO, MD;  Location: [facility] OR;  Service: Vascular;  Laterality: Left;    AV FISTULA PLACEMENT Left 08/28/2018   Procedure: ARTERIOVENOUS (AV) BRACHIOCEPHALIC FISTULA CREATION LEFT UPPER ARM;  Surgeon: Gretta Lonni PARAS, MD;  Location: [facility] OR;  Service: Vascular;  Laterality: Left;   BIOPSY  02/24/2023   Procedure: BIOPSY;  Surgeon: Federico Rosario BROCKS, MD;  Location: [facility] ENDOSCOPY;  Service: Gastroenterology;;   ESOPHAGOGASTRODUODENOSCOPY (EGD) WITH PROPOFOL  N/A 02/24/2023   Procedure: ESOPHAGOGASTRODUODENOSCOPY (EGD) WITH PROPOFOL ;  Surgeon: Federico Rosario BROCKS, MD;  Location: [facility] ENDOSCOPY;  Service: Gastroenterology;  Laterality: N/A;  Patint is s/p right hip ORIF   INTRAMEDULLARY (IM) NAIL INTERTROCHANTERIC Right 02/17/2023   Procedure: INTRAMEDULLARY (IM) NAIL INTERTROCHANTERIC;  Surgeon: Sherida Adine BROCKS, MD;  Location: [facility] OR;  Service: Orthopedics;  Laterality: Right;   IR FLUORO GUIDE CV LINE RIGHT  04/29/2018   IR US  GUIDE VASC ACCESS RIGHT  04/29/2018   RIGHT/LEFT HEART CATH AND CORONARY ANGIOGRAPHY N/A 05/01/2018   Procedure: RIGHT/LEFT HEART CATH AND CORONARY ANGIOGRAPHY;  Surgeon: Claudene Victory ORN, MD;  Location: [facility] INVASIVE CV LAB;  Service: Cardiovascular;  Laterality: N/A;   RIGHT/LEFT HEART CATH AND CORONARY ANGIOGRAPHY N/A 09/29/2022   Procedure: RIGHT/LEFT HEART CATH AND CORONARY ANGIOGRAPHY;  Surgeon: Wonda Sharper, MD;  Location: [facility] INVASIVE CV LAB;  Service: Cardiovascular;  Laterality: N/A;   subdural hematoma Right 07/2020   Family History  Family History  Problem Relation Age of Onset   Stroke Mother    Diabetes Father    Social History  reports that he has never smoked. He has never used smokeless tobacco. He reports that he does not currently use alcohol . He reports that he does not use drugs. Allergies No Known Allergies Home medications Prior to Admission medications   Medication Sig Start Date End Date Taking? Authorizing Provider  acetaminophen  (TYLENOL ) 500 MG tablet Take 2 tablets (1,000 mg total) by mouth every 6 (six) hours as needed for  mild pain (pain score 1-3), fever or headache. Patient taking differently: Take 1,000 mg by mouth every 4 (four) hours as needed for mild pain (pain score 1-3), fever, headache or moderate pain (pain score 4-6). 02/23/23  Yes Laurence Locus, DO  amLODipine  (NORVASC ) 10 MG tablet Take 1 tablet (10 mg total) by mouth daily. Patient taking differently: Take 10 mg by mouth at bedtime. 05/15/23  Yes [name], Analese Sovine J, MD  aspirin  EC 81 MG tablet Take 81 mg by mouth daily. Swallow whole.   Yes [provider]  cyanocobalamin  (VITAMIN B12) 1000 MCG tablet Take 1,000 mcg by mouth daily.   Yes [provider]  ferric citrate  (AURYXIA ) 1 GM 210 MG(Fe) tablet Take 630 mg by mouth 3 (three) times daily with meals.   Yes [provider]  hydrALAZINE  (APRESOLINE ) 25 MG tablet Take 2 tablets (50 mg total) by mouth See admin instructions.  Take 50 mg 3 times daily on M, W, F(dialysis days) Take 50 mg twice daily on Tu, Th, Sat, Sun(non-dialysis days) Patient taking differently: Take 50 mg by mouth See admin instructions. Take 50 mg by mouth three times daily on Sunday, Monday, Wednesday, and Friday. Take 50 mg by mouth twice daily on Tuesday, Thursday, and Saturday. 02/23/23  Yes Laurence Locus, DO  insulin  aspart protamine  - aspart (NOVOLOG  70/30 FLEXPEN) (70-30) 100 UNIT/ML FlexPen Inject 20 Units into the skin at bedtime.   Yes [provider]  isosorbide  mononitrate (IMDUR ) 30 MG 24 hr tablet Take 1 tablet (30 mg total) by mouth 4 (four) times a week. Mon, Wed, Fri, Sun (non-dialysis days) Patient taking differently: Take 30 mg by mouth See admin instructions. Take 30 mg once daily on Sunday, Monday, Wednesday, and Friday. 10/21/22  Yes [name], Render Marley J, MD  metoprolol  succinate (TOPROL -XL) 50 MG 24 hr tablet Take 50 mg by mouth daily.   Yes [provider]  nitroGLYCERIN  (NITROSTAT ) 0.4 MG SL tablet Place 0.4 mg under the tongue every 5 (five) minutes as needed for chest pain.    Yes [provider]  rosuvastatin  (CRESTOR ) 40 MG tablet Take 40 mg by mouth daily. 01/22/22  Yes [provider]  insulin  aspart (NOVOLOG ) 100 UNIT/ML injection Inject 0-6 Units into the skin 3 (three) times daily with meals. CBG 70 - 120: 0 units CBG 121 - 150: 0 units CBG 151 - 200: 1 unit CBG 201-250: 2 units CBG 251-300: 3 units CBG 301-350: 4 units CBG 351-400: 5 units CBG > 400: Give 6 units and call MD Patient not taking: Reported on 07/14/2023 02/23/23   Laurence Locus, DO  Methoxy PEG-Epoetin Beta (MIRCERA IJ) Inject 1 Dose into the skin as directed. 09/12/22 09/11/23  [provider]  ondansetron  (ZOFRAN -ODT) 4 MG disintegrating tablet Take 1 tablet by mouth. Patient not taking: Reported on 07/14/2023 07/12/23   [provider]     Vitals:   07/14/23 1600 07/14/23 1700 07/14/23 1800 07/14/23 1900  BP: (!) 91/44 (!) 125/50 (!) 115/46 (!) 131/55  Pulse: 71 73 72 73  Resp: 20 18 19 14   Temp:      TempSrc:      SpO2: 97% 98% 95% 99%  Weight:       Exam Gen alert, no distress No rash, cyanosis or gangrene Sclera anicteric, throat clear  No jvd or bruits Chest clear bilat to bases, no rales/ wheezing RRR no MRG Abd soft ntnd no mass or ascites +bs GU deferred MS no joint effusions or deformity Ext no LE or UE edema, no other edema Neuro is alert, Ox 3 , nf    Left upper arm AV fistula good bruit, no bleeding, some light bandaging over the distal needle hole, was not removed  Home bp meds: Norvasc  10 mg daily at bedtime Hydralazine  50 mg twice daily, no am dose on hd days Imdur  30 mg once non-hd days Toprol  xl 50mg  every day    OP HD: TTS [place] 4h  B450   76.5kg  2K bath  L AVF   Heparin  none Last HD 7/8, post wt 76.9kg Gets to his dry wt, 2-3 L UF is average Mircera 200 mcg q 2 wks, last 7/01, due 7/15    Assessment/ Plan: Hypotension/ shock: d/t AVF blood loss, not sure if GI losses, possible sepsis. [facility] improving w/o  antibiotics here. W/u in progress per CCM.  ESRD: on HD TTS. Last HD  7/10. Volume and labs are stable, no urgent need for HD. Will plan HD on 7/14 Monday 1st shift (in case of bleeding will have more staff here).  HTN: bp's mostly stable here, holding bp lowering meds.  Volume: euvolemic on exam. Up 1kg by wts.  Anemia of esrd: Hb 7.9-8 here. Tranfuse prn, follow.        Myer Fret  MD [facility] 07/14/2023, 7:24 PM  Recent Labs  Lab 07/13/23 2308 07/14/23 0259  HGB 8.0* 7.9*  ALBUMIN  2.4* 2.2*  CALCIUM  7.3* 7.5*  PHOS 10.3* 7.3*  CREATININE 10.58* 10.38*  K 4.4 3.8   Inpatient medications:  Chlorhexidine  Gluconate Cloth  6 each Topical Daily   insulin  aspart  0-15 Units Subcutaneous TID WC   insulin  aspart  0-5 Units Subcutaneous QHS   insulin  aspart  2 Units Subcutaneous TID WC   insulin  glargine-yfgn  6 Units Subcutaneous QHS    norepinephrine  (LEVOPHED ) Adult infusion Stopped (07/14/23 1050)   acetaminophen , docusate sodium , oxyCODONE -acetaminophen , polyethylene glycol

## 2023-07-15 DIAGNOSIS — E119 Type 2 diabetes mellitus without complications: Secondary | ICD-10-CM | POA: Diagnosis not present

## 2023-07-15 DIAGNOSIS — Z992 Dependence on renal dialysis: Secondary | ICD-10-CM | POA: Diagnosis not present

## 2023-07-15 DIAGNOSIS — N186 End stage renal disease: Secondary | ICD-10-CM

## 2023-07-15 DIAGNOSIS — D62 Acute posthemorrhagic anemia: Secondary | ICD-10-CM

## 2023-07-15 DIAGNOSIS — R571 Hypovolemic shock: Secondary | ICD-10-CM | POA: Diagnosis not present

## 2023-07-15 DIAGNOSIS — D631 Anemia in chronic kidney disease: Secondary | ICD-10-CM

## 2023-07-15 DIAGNOSIS — Z95820 Peripheral vascular angioplasty status with implants and grafts: Secondary | ICD-10-CM

## 2023-07-15 DIAGNOSIS — T82838A Hemorrhage of vascular prosthetic devices, implants and grafts, initial encounter: Principal | ICD-10-CM

## 2023-07-15 LAB — CBC
HCT: 16.8 % — ABNORMAL LOW (ref 39.0–52.0)
Hemoglobin: 5.6 g/dL — CL (ref 13.0–17.0)
MCH: 28.7 pg (ref 26.0–34.0)
MCHC: 33.3 g/dL (ref 30.0–36.0)
MCV: 86.2 fL (ref 80.0–100.0)
Platelets: 176 K/uL (ref 150–400)
RBC: 1.95 MIL/uL — ABNORMAL LOW (ref 4.22–5.81)
RDW: 15.9 % — ABNORMAL HIGH (ref 11.5–15.5)
WBC: 7.8 K/uL (ref 4.0–10.5)
nRBC: 0 % (ref 0.0–0.2)

## 2023-07-15 LAB — BASIC METABOLIC PANEL WITH GFR
Anion gap: 17 — ABNORMAL HIGH (ref 5–15)
BUN: 66 mg/dL — ABNORMAL HIGH (ref 8–23)
CO2: 25 mmol/L (ref 22–32)
Calcium: 6.9 mg/dL — ABNORMAL LOW (ref 8.9–10.3)
Chloride: 90 mmol/L — ABNORMAL LOW (ref 98–111)
Creatinine, Ser: 11.76 mg/dL — ABNORMAL HIGH (ref 0.61–1.24)
GFR, Estimated: 4 mL/min — ABNORMAL LOW (ref >60)
Glucose, Bld: 132 mg/dL — ABNORMAL HIGH (ref 70–99)
Potassium: 3.9 mmol/L (ref 3.5–5.1)
Sodium: 132 mmol/L — ABNORMAL LOW (ref 135–145)

## 2023-07-15 LAB — IRON AND TIBC
Iron: 79 ug/dL (ref 45–182)
Saturation Ratios: 64 % — ABNORMAL HIGH (ref 17.9–39.5)
TIBC: 123 ug/dL — ABNORMAL LOW (ref 250–450)
UIBC: 44 ug/dL

## 2023-07-15 LAB — FERRITIN: Ferritin: >7500 ng/mL — ABNORMAL HIGH (ref 24–336)

## 2023-07-15 LAB — HEPATITIS B SURFACE ANTIGEN: Hepatitis B Surface Ag: NONREACTIVE

## 2023-07-15 LAB — MAGNESIUM: Magnesium: 1.9 mg/dL (ref 1.7–2.4)

## 2023-07-15 LAB — GLUCOSE, CAPILLARY
Glucose-Capillary: 137 mg/dL — ABNORMAL HIGH (ref 70–99)
Glucose-Capillary: 144 mg/dL — ABNORMAL HIGH (ref 70–99)
Glucose-Capillary: 135 mg/dL — ABNORMAL HIGH (ref 70–99)
Glucose-Capillary: 146 mg/dL — ABNORMAL HIGH (ref 70–99)

## 2023-07-15 LAB — PHOSPHORUS: Phosphorus: 7.3 mg/dL — ABNORMAL HIGH (ref 2.5–4.6)

## 2023-07-15 LAB — PREPARE RBC (CROSSMATCH)

## 2023-07-15 MED ORDER — ROSUVASTATIN CALCIUM 20 MG PO TABS
40.0000 mg | ORAL_TABLET | Freq: Every day | ORAL | Status: DC
  Administered 2023-07-15 – 2023-07-18 (×4): 40 mg via ORAL
  Filled 2023-07-15 (×4): qty 2

## 2023-07-15 MED ORDER — VITAMIN B-12 1000 MCG PO TABS
1000.0000 ug | ORAL_TABLET | Freq: Every day | ORAL | Status: DC
  Administered 2023-07-15 – 2023-07-18 (×4): 1000 ug via ORAL
  Filled 2023-07-15 (×4): qty 1

## 2023-07-15 MED ORDER — CHLORHEXIDINE GLUCONATE CLOTH 2 % EX PADS
6.0000 | MEDICATED_PAD | Freq: Every day | CUTANEOUS | Status: DC
  Administered 2023-07-16 – 2023-07-17 (×2): 6 via TOPICAL

## 2023-07-15 MED ORDER — PANTOPRAZOLE SODIUM 40 MG IV SOLR
40.0000 mg | Freq: Two times a day (BID) | INTRAVENOUS | Status: DC
  Administered 2023-07-15 – 2023-07-18 (×7): 40 mg via INTRAVENOUS
  Filled 2023-07-15 (×7): qty 10

## 2023-07-15 MED ORDER — ALBUMIN HUMAN 25 % IV SOLN: 25.0000 g | INTRAVENOUS | Status: DC | PRN

## 2023-07-15 MED ORDER — SODIUM CHLORIDE 0.9% IV SOLUTION: Freq: Once | INTRAVENOUS | Status: AC

## 2023-07-15 MED ORDER — DARBEPOETIN ALFA 200 MCG/0.4ML IJ SOSY
200.0000 ug | PREFILLED_SYRINGE | INTRAMUSCULAR | Status: DC
  Administered 2023-07-15: 200 ug via SUBCUTANEOUS
  Filled 2023-07-15: qty 0.4

## 2023-07-15 NOTE — Progress Notes (Signed)
   07/15/23 0341  Provider Notification  Provider Name/Title [facility]  Date Provider Notified 07/15/23  Time Provider Notified [phone]  Method of Notification Call  Notification Reason Critical Result  Test performed and critical result Hgb 5.6  Date Critical Result Received 07/15/23  Time Critical Result Received 0341  Provider response Evaluate remotely

## 2023-07-15 NOTE — Progress Notes (Signed)
 Received patient from previous nurse. Patient resting in bed. Patient not in acute distress. Safety measures in place. Wife at the bedside. Will continue to follow plan of care.

## 2023-07-15 NOTE — Progress Notes (Signed)
 eLink Physician-Brief Progress Note Patient Name: Justin [name] DOB: 1950/04/22 MRN: [id number]   Date of Service  07/15/2023  HPI/Events of Note  Hgb 5.6 No signs of active bleeding  eICU Interventions  Transfuse 2 units of PRBC     Intervention Category Intermediate Interventions: Bleeding - evaluation and treatment with blood products  CLAUDENE AGENT, P 07/15/2023, 4:10 AM

## 2023-07-15 NOTE — Progress Notes (Signed)
 NAME:  Justin [name], MRN:  [id number], DOB:  03/28/1950, LOS: 2 ADMISSION DATE:  07/13/2023, CONSULTATION DATE:  07/15/23 REFERRING MD:  [facility], CHIEF COMPLAINT:  hemorrhagic shock   History of Present Illness:  Justin [name] is a 73 y.o. M with PMH significant for ESRD on dialysis T/Th/Sat, CHF, TCAD, AS, ype 2 DM who has presented to [facility] with significant bleeding from his LUE AV fistula.  He states over the last three weeks he has had bleeding at every dialysis session.  He had a fall today, but says this was a slide from sitting and he did not hit his arm.  It began spontaneously bleeding around 5 am, Pt's significant other had to hold pressure and patient became lethargic, therefore she called EMS.   In the ED he was transfused prior to labs, but after 2 units PRBC's his Hgb was 9, a stitch was placed in the fistula with cessation of bleeding.  He noted the onset of diarrhea today as well, this is dark, but he has had dark stools since starting iron  supplementation. He had a WBC of 21k, denies any recent fevers, chills, nausea or vomiting.  He had some mild abdominal pain today.  A CT head and CTA abd/pelvis did not show any signs of trauma or source of bleeding.  He remained hypotensive despite transfusion, so was transferred to the ICU for critical care and vascular surgery evaluation.  Of note he was admitted in 04/2023 and had a fistulogram and angioplasty of the fistula due to stenosis.   Pertinent  Medical History   has a past medical history of BPH (benign prostatic hyperplasia), Diabetes (HCC), Dysuria, Erectile dysfunction, ESRD on hemodialysis (HCC), History of chronic CHF (11/19/2019), Hyperlipemia, Hypertension, Personal history of COVID-19 (11/19/2019), Pinna disorder, left, and Wears glasses.   Significant Hospital Events: Including procedures, antibiotic start and stop dates in addition to other pertinent events   7/11 transfer from [facility] after AV fistula  bleed  Interim History / Subjective:  Has successfully weaned off Levophed , insulin  Hemoglobin dropped from 7.9 > 5.6 without any evidence of bleeding from this patient (HD was deferred yesterday)  Objective    Blood pressure (!) 127/57, pulse 74, temperature 98.5 F (36.9 C), temperature source Oral, resp. rate (!) 23, weight 80.1 kg, SpO2 94%.        Intake/Output Summary (Last 24 hours) at 07/15/2023 0705 Last data filed at 07/15/2023 9364 Gross per 24 hour  Intake 913.56 ml  Output 50 ml  Net 863.56 ml   Filed Weights   07/14/23 0702 07/15/23 0500  Weight: 77.4 kg 80.1 kg    General: Comfortable, laying in bed HEENT: Oropharynx clear, pupils equal.  Strong voice, no secretions Neuro: Awake alert, well-oriented, follows commands with good strength CV: Regular, no murmur PULM: Clear bilaterally GI: Nondistended, nontender Extremities: Left upper extremity fistula site without any evidence of bleeding.  Good thrill    Resolved problem list   Assessment and Plan   Hemorrhagic Shock with possible concurrent septic shock, no clear septic source Bleeding AV fistula Dark stools, on iron  supplementation, question occult GI blood loss Angioplasty of the L AV fistula in April due to significant stenosis Further drop in hemoglobin.  Even in the absence of evidence for blood loss visualized - Continue to follow CBC-patient with closely - Transfusion for goal hemoglobin 7.0 - Will involve gastroenterology today 7/13 - Follow blood cultures, no clear indication for antibiotics at this time - Deferred  C. difficile testing, following for stool studies - Appreciate VVS evaluation.  Suspect recurrent stenosis and planning for fistulogram beginning of the week.  No acute intervention planned . Okay to use the fistula for HD in the interim  Acute Diarrhea prior to presentation, resolved Acute LLQ pain Question GI bleeding, dark stools (on iron ) -Dark stools but no overt bleeding  noted by the patient. - Persistent drop in hemoglobin, question a component of dilution since he has not gotten HD.  Will ask GI to see him and plan workup -Could consider CT abdomen given his left lower quadrant pain.  Will discuss with GI  ESRD Anion gap metabolic acidosis, likely principally due to his renal status.  Considered also contribution mild DKA but ketones negative T/Th/Sat dialysis, missed his HD 7/10 -Appreciate nephrology assistance - No urgent indication for HD on 7/12 or 13, planning for HD 7/14 - Follow BMP closely - Bicarbonate drip off - Insulin  drip off as below.  Beta hydroxybutyrate was negative  Diabetes type 2 on outpatient insulin  with hyperglycemia -Long-acting insulin  adjusted.  Will plan appropriate home regimen prior to discharge.  Suspect need to supplement usual home dosing under stress circumstances  HTN HFpEF CAD Severe AS (08/2022 TTE) -Home hydralazine  and beta-blocker both on hold currently, restart when hemodynamics will allow    Best Practice (right click and Reselect all SmartList Selections daily)   Diet/type: Regular consistency (see orders) DVT prophylaxis SCD Pressure ulcer(s): N/A GI prophylaxis: N/A Lines: Central line Foley:  N/A Code Status:  full code Last date of multidisciplinary goals of care discussion [Pt and significant other updated at the bedside 7/12]  Labs   CBC: Recent Labs  Lab 07/13/23 2308 07/14/23 0259 07/15/23 0322  WBC 19.2* 14.8* 7.8  HGB 8.0* 7.9* 5.6*  HCT 24.1* 23.1* 16.8*  MCV 87.3 85.6 86.2  PLT 231 206 176    Basic Metabolic Panel: Recent Labs  Lab 07/13/23 2308 07/14/23 0259 07/15/23 0322  NA 128* 134* 132*  K 4.4 3.8 3.9  CL 94* 92* 90*  CO2 15* 30 25  GLUCOSE 320* 274* 132*  BUN 58* 58* 66*  CREATININE 10.58* 10.38* 11.76*  CALCIUM  7.3* 7.5* 6.9*  MG 2.0 2.1 1.9  PHOS 10.3* 7.3* 7.3*   GFR: Estimated Creatinine Clearance: 5.5 mL/min (A) (by C-G formula based on SCr of 11.76  mg/dL (H)). Recent Labs  Lab 07/13/23 2308 07/14/23 0259 07/15/23 0322  WBC 19.2* 14.8* 7.8  LATICACIDVEN 3.3* 3.1*  --     Liver Function Tests: Recent Labs  Lab 07/13/23 2308 07/14/23 0259  AST 122* 145*  ALT 121* 135*  ALKPHOS 69 61  BILITOT 0.7 0.6  PROT 5.2* 4.9*  ALBUMIN  2.4* 2.2*   No results for input(s): LIPASE, AMYLASE in the last 168 hours. No results for input(s): AMMONIA in the last 168 hours.  ABG    Component Value Date/Time   PHART 7.389 09/29/2022 1027   PCO2ART 48.3 (H) 09/29/2022 1027   PO2ART 61 (L) 09/29/2022 1027   HCO3 29.2 (H) 09/29/2022 1027   HCO3 29.4 (H) 09/29/2022 1027   TCO2 31 09/29/2022 1027   TCO2 31 09/29/2022 1027   O2SAT 90 09/29/2022 1027   O2SAT 70 09/29/2022 1027     Coagulation Profile: Recent Labs  Lab 07/13/23 2308  INR 1.6*    Cardiac Enzymes: No results for input(s): CKTOTAL, CKMB, CKMBINDEX, TROPONINI in the last 168 hours.  HbA1C: Hgb A1c MFr Bld  Date/Time Value Ref  Range Status  02/17/2023 05:59 AM 8.3 (H) 4.8 - 5.6 % Final    Comment:    (NOTE) Pre diabetes:          5.7%-6.4%  Diabetes:              >6.4%  Glycemic control for   <7.0% adults with diabetes   06/05/2022 05:58 PM 6.4 (H) 4.8 - 5.6 % Final    Comment:    (NOTE)         Prediabetes: 5.7 - 6.4         Diabetes: >6.4         Glycemic control for adults with diabetes: <7.0     CBG: Recent Labs  Lab 07/14/23 1201 07/14/23 1329 07/14/23 1529 07/14/23 1934 07/14/23 2209  GLUCAP 130* 127* 128* 125* 131*     Critical care time:   NA       Lamar Chris, MD, PhD 07/15/2023, 7:05 AM [facility] [phone] or if no answer before 7:00PM call [phone] For any issues after 7:00PM please call eLink [phone]

## 2023-07-15 NOTE — Progress Notes (Signed)
 [facility] Progress Note  Subjective:  Seen in ICU room C/o some L flank ('kidney) pain Still voids, no UTI symptoms  Vitals:   07/15/23 0730 07/15/23 0800 07/15/23 0900 07/15/23 1000  BP:  (!) 138/59 (!) 141/55 (!) 153/60  Pulse:  79 78 76  Resp:  20 18 (!) 22  Temp: 98.6 F (37 C)  98.6 F (37 C)   TempSrc: Oral  Oral   SpO2:  94% 94% 98%  Weight:        Exam: Gen alert, no distress No jvd or bruits Chest clear bilat to bases RRR no MRG Abd soft ntnd no mass or ascites +bs No flank tenderness Ext no LE edema Neuro is alert, Ox 3 , nf    LUA AVF+bruit, distal AVF w/ 1 stitch   Home bp meds: Norvasc  10 mg daily at bedtime Hydralazine  50 mg twice daily, no am dose on hd days Imdur  30 mg once non-hd days Toprol  xl 50mg  every day      OP HD: TTS [facility] 4h  B450   76.5kg  2K bath  L AVF   Heparin  none Last HD 7/8, post wt 76.9kg Gets to his dry wt, 2-3 L UF is average Mircera 200 mcg q 2 wks, last 7/01, due 7/15       Assessment/ Plan: Hypotension/ shock: d/t AVF blood loss, not sure if GI losses, possible sepsis. Hypotension resolved. [facility] improving w/o antibiotics here. W/u in progress per CCM. Anemia/ possible GIB: s/p 2u prbc's overnight. Per pmd, GI consulting. Getting PPI.  ESRD: on HD TTS. Last HD 7/10. Wt's are up, will plan HD later today in the evening.  HTN: bp's mostly stable here, holding bp lowering meds.  Volume: vol excess by wts and d/t missed HD. Plan HD as above Anemia of esrd: Hb 7.9-8 here, then down to 5.6 this am. Getting prbc's. Esa due shortly, will order darbe 200mcg sq weekly now to start this evening. Check iron  stores.       Myer Fret MD  [facility] 07/15/2023, 10:48 AM  Recent Labs  Lab 07/13/23 2308 07/14/23 0259 07/15/23 0322  HGB 8.0* 7.9* 5.6*  ALBUMIN  2.4* 2.2*  --   CALCIUM  7.3* 7.5* 6.9*  PHOS 10.3* 7.3* 7.3*  CREATININE 10.58* 10.38* 11.76*  K 4.4 3.8 3.9   No results for input(s): IRON , TIBC,  FERRITIN in the last 168 hours. Inpatient medications:  Chlorhexidine  Gluconate Cloth  6 each Topical Daily   Chlorhexidine  Gluconate Cloth  6 each Topical Q0600   insulin  aspart  0-15 Units Subcutaneous TID WC   insulin  aspart  0-5 Units Subcutaneous QHS   insulin  aspart  2 Units Subcutaneous TID WC   insulin  glargine-yfgn  6 Units Subcutaneous QHS   pantoprazole  (PROTONIX ) IV  40 mg Intravenous Q12H    acetaminophen , docusate sodium , oxyCODONE -acetaminophen , polyethylene glycol

## 2023-07-15 NOTE — Consult Note (Addendum)
 Consultation Note   Referring Provider:  PCCM PCP: Nichole Senior, MD Primary Gastroenterologist:   [facility] ( Dr. Freddrick) . Maybe [facility]     Reason for Consultation:  ? GI bleed DOA: 07/13/2023         Hospital Day: 3   ASSESSMENT    73 year old male with ESRD on HD admitted with hemorrhagic shock , bleeding from AV fistula and concern for a GI bleed.   Suspect acute on chronic anemia is multifactorial (bleeding from AV fistula, possibly a dilutional component having not dialyzed in a few days, and possibly GI bleed.  Stools are chronically dark on iron .  However he did have  loose stools a couple days ago which has since resolved ( stool now formed).  At this point it is unclear whether he has had a GI bleed but does not appear to be actively bleeding.  He is hemodynamically stable.   History of Grade D esophagitis and gastroduodenitis Feb 2025 He has not been on any acid reducing medication at home  Severe aortic stenosis  History of colon polyps Colon polyps removed at time of colonoscopy at [facility] in 2021.  Nature polyps unknown  See PMH for additional history  Principal Problem:   Hemorrhagic shock (HCC)     PLAN:   --Continue twice daily PPI --Trend H&H --May need upper endoscopy at some point, not urgent as he is hemodynamically stable without evidence for active GI bleeding at this time . He should undergo dialysis first prior to endoscopic evaluation  --Okay for diet from GI standpoint --Given need for daily asa and severe esophagitis / gastroduodenitis he should probably been on daily PPI indefinitely -- Outpatient polyp surveillance colonoscopy per [facility] medical GI   HPI   73 y.o. year old male with a medical history including but not limited to ESRD on HD TTS, CAD, HFpEF, and severe aortic stenosis, severe esophagitis, gastroduodenitis  Brief GI History:  Future when patient  was seen by our practice in the hospital in February for evaluation of anemia .  He underwent inpatient EGD with findings of grade D esophagitis, gastritis with hemorrhage, and a submucosal duodenal nodule ( see full report below)  Interval history Patient was admitted 07/13/2023 with hemorrhagic shock.  He has had some recent bleeding from AV fistula following a couple of dialysis sessions.  Hgb 8, down from 10 in late February.a stitch was placed to the fistula in the ED with cessation of fistula bleeding.  However hemoglobin continued to drop over the following couple days and today was down to 5.6.  He has received 2 units of RBCs, follow-up H&H is pending.  We were called for concern of GI bleed .  Patient has chronically dark stools on oral iron .  On Friday, 2 days ago, he had several loose dark stools.  However yesterday stools became more solid.  Last BM was last night and it was solid.  There was mention of abdominal pain and some of the notes this admission.  Patient is adamant that he has not had any abdominal pain.  His pain is in his left lower back.  He keeps referring to it as kidney pain.  No history of kidney  stones.  Pain is not positional/related to movement.  He has not had any nausea nor vomiting.  He takes a daily baby aspirin , no other NSAIDs.  He is not on any acid reducing medications at home.   Patient has not received dialysis in a few days . Vascular medicine has evaluated, plan is for fistula revision next week.  His creatinine is 11.6 today   Labs and Imaging:  Recent Labs    07/13/23 2308 07/14/23 0259  PROT 5.2* 4.9*  ALBUMIN  2.4* 2.2*  AST 122* 145*  ALT 121* 135*  ALKPHOS 69 61  BILITOT 0.7 0.6   Recent Labs    07/13/23 2308 07/14/23 0259 07/15/23 0322  WBC 19.2* 14.8* 7.8  HGB 8.0* 7.9* 5.6*  HCT 24.1* 23.1* 16.8*  MCV 87.3 85.6 86.2  PLT 231 206 176   Recent Labs    07/13/23 2308 07/14/23 0259 07/15/23 0322  NA 128* 134* 132*  K 4.4 3.8 3.9   CL 94* 92* 90*  CO2 15* 30 25  GLUCOSE 320* 274* 132*  BUN 58* 58* 66*  CREATININE 10.58* 10.38* 11.76*  CALCIUM  7.3* 7.5* 6.9*     ECHOCARDIOGRAM COMPLETE    ECHOCARDIOGRAM REPORT       Patient Name:   Justin [name] Date of Exam: 07/11/2023 Medical Rec #:  [id number]     Height:       68.0 in Accession #:    [id number]    Weight:       173.8 lb Date of Birth:  08-29-1950      BSA:          1.925 m Patient Age:    73 years      BP:           140/58 mmHg Patient Gender: M             HR:           62 bpm. Exam Location:  [place]  Procedure: 2D Echo, Cardiac Doppler, Color Doppler and Strain Analysis (Both            Spectral and Color Flow Doppler were utilized during procedure).  Indications:    Dyspnea on exertion [R06.09 (ICD-10-CM)]   History:        Patient has prior history of Echocardiogram examinations, most                 recent 08/30/2022. CHF, CAD, Aortic Valve Disease; Risk                 Factors:Diabetes, Dyslipidemia and Hypertension. End-stage                 kidney disease on dialysis.   Sonographer:    Charlie Jointer RDCS Referring Phys: [id number] [name] [name]  IMPRESSIONS   1. Left ventricular ejection fraction, by estimation, is 60 to 65%. The left ventricle has normal function. The left ventricle has no regional wall motion abnormalities. There is severe left ventricular hypertrophy. Left ventricular diastolic parameters  are consistent with Grade II diastolic dysfunction (pseudonormalization).  2. Right ventricular systolic function is normal. The right ventricular size is mildly enlarged.  3. Left atrial size was severely dilated.  4. A small pericardial effusion is present. The pericardial effusion is circumferential. There is no evidence of cardiac tamponade.  5. The mitral valve is degenerative. Mild mitral valve regurgitation. No evidence of mitral stenosis.  6. The aortic valve is tricuspid. There is moderate calcification  of the aortic valve.  Aortic valve regurgitation is mild. Moderate to severe aortic valve stenosis. Aortic valve area, by VTI measures 0.79 cm. Aortic valve mean gradient measures 23.8  mmHg. Aortic valve Vmax measures 3.21 m/s.  7. The inferior vena cava is normal in size with greater than 50% respiratory variability, suggesting right atrial pressure of 3 mmHg.  Comparison(s): In comparison prior TTE 8/8/82024 LVEF 60-65%, grade 1DD, trace MR, AS-paradoxical low gradient severe, small-moderate pericardial effusion.  FINDINGS  Left Ventricle: Left ventricular ejection fraction, by estimation, is 60 to 65%. The left ventricle has normal function. The left ventricle has no regional wall motion abnormalities. Global longitudinal strain performed but not reported based on  interpreter judgement due to suboptimal tracking. The left ventricular internal cavity size was normal in size. There is severe left ventricular hypertrophy. Left ventricular diastolic parameters are consistent with Grade II diastolic dysfunction  (pseudonormalization).  Right Ventricle: The right ventricular size is mildly enlarged. Right vetricular wall thickness was not well visualized. Right ventricular systolic function is normal.  Left Atrium: Left atrial size was severely dilated.  Right Atrium: Right atrial size was normal in size.  Pericardium: A small pericardial effusion is present. The pericardial effusion is circumferential. There is no evidence of cardiac tamponade.  Mitral Valve: The mitral valve is degenerative in appearance. Mild mitral annular calcification. Mild mitral valve regurgitation. No evidence of mitral valve stenosis.  Tricuspid Valve: The tricuspid valve is not well visualized. Tricuspid valve regurgitation is mild . No evidence of tricuspid stenosis.  Aortic Valve: The aortic valve is tricuspid. There is moderate calcification of the aortic valve. Aortic valve regurgitation is mild. Moderate to severe aortic stenosis is  present. Aortic valve mean gradient measures 23.8 mmHg. Aortic valve peak gradient  measures 41.3 mmHg. Aortic valve area, by VTI measures 0.79 cm.  Pulmonic Valve: The pulmonic valve was grossly normal. Pulmonic valve regurgitation is trivial. No evidence of pulmonic stenosis.  Aorta: The aortic root and ascending aorta are structurally normal, with no evidence of dilitation.  Venous: The inferior vena cava is normal in size with greater than 50% respiratory variability, suggesting right atrial pressure of 3 mmHg.  IAS/Shunts: The interatrial septum was not well visualized.    LEFT VENTRICLE PLAX 2D LVIDd:         4.70 cm   Diastology LVIDs:         3.10 cm   LV e' medial:    4.68 cm/s LV PW:         1.60 cm   LV E/e' medial:  15.8 LV IVS:        1.70 cm   LV e' lateral:   6.60 cm/s LVOT diam:     2.10 cm   LV E/e' lateral: 11.2 LV SV:         63 LV SV Index:   33 LVOT Area:     3.46 cm    RIGHT VENTRICLE             IVC RV Basal diam:  4.10 cm     IVC diam: 2.00 cm RV Mid diam:    3.30 cm RV S prime:     12.43 cm/s TAPSE (M-mode): 2.3 cm  LEFT ATRIUM            Index        RIGHT ATRIUM           Index LA diam:      4.50 cm  2.34 cm/m   RA Area:     14.80 cm LA Vol (A2C): 106.0 ml 55.06 ml/m  RA Volume:   37.50 ml  19.48 ml/m LA Vol (A4C): 102.0 ml 52.98 ml/m  AORTIC VALVE AV Area (Vmax):    0.86 cm AV Area (Vmean):   0.77 cm AV Area (VTI):     0.79 cm AV Vmax:           321.40 cm/s AV Vmean:          229.200 cm/s AV VTI:            0.792 m AV Peak Grad:      41.3 mmHg AV Mean Grad:      23.8 mmHg LVOT Vmax:         79.93 cm/s LVOT Vmean:        50.800 cm/s LVOT VTI:          0.182 m LVOT/AV VTI ratio: 0.23   AORTA Ao Root diam: 3.70 cm Ao Asc diam:  3.60 cm Ao Desc diam: 2.10 cm  MITRAL VALVE MV Area (PHT): 3.78 cm    SHUNTS MV Decel Time: 201 msec    Systemic VTI:  0.18 m MR Peak grad: 48.2 mmHg    Systemic Diam: 2.10 cm MR Vmax:      347.00  cm/s MV E velocity: 73.90 cm/s MV A velocity: 73.55 cm/s MV E/A ratio:  1.00  Sreedhar reddy Madireddy Electronically signed by Alean reddy Madireddy Signature Date/Time: 07/12/2023/8:26:22 PM      Final     Pertinent GI Studies   February 2025 EGD for dysphagia, abnormal barium swallow and anemia - LA Grade D esophagitis with no bleeding. Biopsied. - Gastritis with hemorrhage. Biopsied. - Submucosal nodule found in the duodenum. Biopsied. FINAL MICROSCOPIC DIAGNOSIS:  A. DUODENUM, BIOPSY:      Duodenal mucosa with foveolar metaplasia and Brunner gland hyperplasia consistent with chronic peptic duodenitis.      Negative for dysplasia.  B. STOMACH, BIOPSY:      Gastric antral mucosa with reactive gastropathy.      Gastric oxyntic mucosa without significant diagnostic alteration.      No H. pylori identified on HE stain.      Negative for intestinal metaplasia or dysplasia.  C. ESOPHAGUS, BIOPSY:      Active esophagitis with focal necrosis and marked reactive epithelial changes.Bacterial colonization with mixed bacilli and cocci identified.      Reflective foreign material suggestive for food or medication component.      Immunohistochemical stain for HSV and special stain for GMS will be reported in an addendum.      Negative for intestinal metaplasia or dysplasia.   November 2021 colonoscopy at [facility] medical Moderate diverticulosis in the left colon.  A single polyp measuring 7 mm in size was removed from the descending colon.  A single polyp measuring 10 mm in size was removed from the sigmoid colon.  Internal hemorrhoids.  Path: Descending colon polyp was a tubular adenoma.  Sigmoid colon polyp was a tubular adenoma.  Past Medical History:  Diagnosis Date   BPH (benign prostatic hyperplasia)    Diabetes (HCC)    Dysuria    Erectile dysfunction    ESRD on hemodialysis (HCC)    History of chronic CHF 11/19/2019   Hyperlipemia    Hypertension    Personal  history of COVID-19 11/19/2019   Pinna disorder, left    Wears glasses     Past Surgical  History:  Procedure Laterality Date   A/V SHUNT INTERVENTION N/A 04/16/2023   Procedure: A/V SHUNT INTERVENTION;  Surgeon: Norine Manuelita LABOR, MD;  Location: [facility] INVASIVE CV LAB;  Service: Cardiovascular;  Laterality: N/A;   AV FISTULA PLACEMENT Left 04/30/2018   Procedure: CREATION OF RADIOCEPHALIC ARTERIOVENOUS FISTULA LEFT ARM;  Surgeon: Harvey Carlin BRAVO, MD;  Location: [facility] OR;  Service: Vascular;  Laterality: Left;   AV FISTULA PLACEMENT Left 08/28/2018   Procedure: ARTERIOVENOUS (AV) BRACHIOCEPHALIC FISTULA CREATION LEFT UPPER ARM;  Surgeon: Gretta Lonni PARAS, MD;  Location: [facility] OR;  Service: Vascular;  Laterality: Left;   BIOPSY  02/24/2023   Procedure: BIOPSY;  Surgeon: Federico Rosario BROCKS, MD;  Location: [facility] ENDOSCOPY;  Service: Gastroenterology;;   ESOPHAGOGASTRODUODENOSCOPY (EGD) WITH PROPOFOL  N/A 02/24/2023   Procedure: ESOPHAGOGASTRODUODENOSCOPY (EGD) WITH PROPOFOL ;  Surgeon: Federico Rosario BROCKS, MD;  Location: [facility] ENDOSCOPY;  Service: Gastroenterology;  Laterality: N/A;  Patint is s/p right hip ORIF   INTRAMEDULLARY (IM) NAIL INTERTROCHANTERIC Right 02/17/2023   Procedure: INTRAMEDULLARY (IM) NAIL INTERTROCHANTERIC;  Surgeon: Sherida Adine BROCKS, MD;  Location: [facility] OR;  Service: Orthopedics;  Laterality: Right;   IR FLUORO GUIDE CV LINE RIGHT  04/29/2018   IR US  GUIDE VASC ACCESS RIGHT  04/29/2018   RIGHT/LEFT HEART CATH AND CORONARY ANGIOGRAPHY N/A 05/01/2018   Procedure: RIGHT/LEFT HEART CATH AND CORONARY ANGIOGRAPHY;  Surgeon: Claudene Victory ORN, MD;  Location: [facility] INVASIVE CV LAB;  Service: Cardiovascular;  Laterality: N/A;   RIGHT/LEFT HEART CATH AND CORONARY ANGIOGRAPHY N/A 09/29/2022   Procedure: RIGHT/LEFT HEART CATH AND CORONARY ANGIOGRAPHY;  Surgeon: Wonda Sharper, MD;  Location: [facility] INVASIVE CV LAB;  Service: Cardiovascular;  Laterality: N/A;   subdural hematoma Right 07/2020    Family History  Problem  Relation Age of Onset   Stroke Mother    Diabetes Father     Prior to Admission medications   Medication Sig Start Date End Date Taking? Authorizing Provider  acetaminophen  (TYLENOL ) 500 MG tablet Take 2 tablets (1,000 mg total) by mouth every 6 (six) hours as needed for mild pain (pain score 1-3), fever or headache. Patient taking differently: Take 1,000 mg by mouth every 4 (four) hours as needed for mild pain (pain score 1-3), fever, headache or moderate pain (pain score 4-6). 02/23/23  Yes Laurence Locus, DO  amLODipine  (NORVASC ) 10 MG tablet Take 1 tablet (10 mg total) by mouth daily. Patient taking differently: Take 10 mg by mouth at bedtime. 05/15/23  Yes [name], [name], MD  aspirin  EC 81 MG tablet Take 81 mg by mouth daily. Swallow whole.   Yes [provider]  cyanocobalamin  (VITAMIN B12) 1000 MCG tablet Take 1,000 mcg by mouth daily.   Yes [provider]  ferric citrate  (AURYXIA ) 1 GM 210 MG(Fe) tablet Take 630 mg by mouth 3 (three) times daily with meals.   Yes [provider]  hydrALAZINE  (APRESOLINE ) 25 MG tablet Take 2 tablets (50 mg total) by mouth See admin instructions. Take 50 mg 3 times daily on M, W, F(dialysis days) Take 50 mg twice daily on Tu, Th, Sat, Sun(non-dialysis days) Patient taking differently: Take 50 mg by mouth See admin instructions. Take 50 mg by mouth three times daily on Sunday, Monday, Wednesday, and Friday. Take 50 mg by mouth twice daily on Tuesday, Thursday, and Saturday. 02/23/23  Yes Laurence Locus, DO  insulin  aspart protamine  - aspart (NOVOLOG  70/30 FLEXPEN) (70-30) 100 UNIT/ML FlexPen Inject 20 Units into the skin at bedtime.   Yes [provider]  isosorbide  mononitrate (IMDUR ) 30 MG 24 hr tablet Take 1 tablet (30 mg total) by mouth 4 (four) times a week. Mon, Wed, Fri, Sun (non-dialysis days) Patient taking differently: Take 30 mg by mouth See admin instructions. Take 30 mg once daily on Sunday, Monday, Wednesday, and  Friday. 10/21/22  Yes [name], [name], MD  metoprolol  succinate (TOPROL -XL) 50 MG 24 hr tablet Take 50 mg by mouth daily.   Yes [provider]  nitroGLYCERIN  (NITROSTAT ) 0.4 MG SL tablet Place 0.4 mg under the tongue every 5 (five) minutes as needed for chest pain.   Yes [provider]  rosuvastatin  (CRESTOR ) 40 MG tablet Take 40 mg by mouth daily. 01/22/22  Yes [provider]  insulin  aspart (NOVOLOG ) 100 UNIT/ML injection Inject 0-6 Units into the skin 3 (three) times daily with meals. CBG 70 - 120: 0 units CBG 121 - 150: 0 units CBG 151 - 200: 1 unit CBG 201-250: 2 units CBG 251-300: 3 units CBG 301-350: 4 units CBG 351-400: 5 units CBG > 400: Give 6 units and call MD Patient not taking: Reported on 07/14/2023 02/23/23   Laurence Locus, DO  Methoxy PEG-Epoetin Beta (MIRCERA IJ) Inject 1 Dose into the skin as directed. 09/12/22 09/11/23  [provider]  ondansetron  (ZOFRAN -ODT) 4 MG disintegrating tablet Take 1 tablet by mouth. Patient not taking: Reported on 07/14/2023 07/12/23   [provider]    Current Facility-Administered Medications  Medication Dose Route Frequency Provider Last Rate Last Admin   acetaminophen  (TYLENOL ) tablet 650 mg  650 mg Oral Q6H PRN Claudene Agent, MD   650 mg at 07/14/23 0329   Chlorhexidine  Gluconate Cloth 2 % PADS 6 each  6 each Topical Daily Dahal, Chapman, MD   6 each at 07/14/23 0953   Chlorhexidine  Gluconate Cloth 2 % PADS 6 each  6 each Topical Q0600 Geralynn Charleston, MD   6 each at 07/15/23 0748   docusate sodium  (COLACE) capsule 100 mg  100 mg Oral BID PRN Gleason, Laura R, PA-C       insulin  aspart (novoLOG ) injection 0-15 Units  0-15 Units Subcutaneous TID WC Byrum, Robert S, MD   2 Units at 07/15/23 9251   insulin  aspart (novoLOG ) injection 0-5 Units  0-5 Units Subcutaneous QHS Byrum, Robert S, MD       insulin  aspart (novoLOG ) injection 2 Units  2 Units Subcutaneous TID WC Byrum, Robert S, MD   2 Units at  07/15/23 9251   insulin  glargine-yfgn (SEMGLEE ) injection 6 Units  6 Units Subcutaneous QHS Byrum, Robert S, MD   6 Units at 07/14/23 1101   oxyCODONE -acetaminophen  (PERCOCET/ROXICET) 5-325 MG per tablet 1 tablet  1 tablet Oral Q6H PRN Byrum, Robert S, MD   1 tablet at 07/15/23 0750   polyethylene glycol (MIRALAX  / GLYCOLAX ) packet 17 g  17 g Oral Daily PRN Gleason, Leita SAUNDERS, PA-C        Allergies as of 07/13/2023   (No Known Allergies)    Social History   Socioeconomic History   Marital status: Divorced    Spouse name: Not on file   Number of children: 1   Years of education: Not on file   Highest education level: Not on file  Occupational History   Not on file  Tobacco Use   Smoking status: Never   Smokeless tobacco: Never  Vaping Use   Vaping status: Never Used  Substance and Sexual Activity   Alcohol  use: Not Currently  Drug use: Never   Sexual activity: Not Currently  Other Topics Concern   Not on file  Social History Narrative   Not on file   Social Drivers of Health   Financial Resource Strain: Low Risk  (07/13/2020)   Received from [facility]   Overall Financial Resource Strain (CARDIA)    Difficulty of Paying Living Expenses: Not very hard  Food Insecurity: No Food Insecurity (07/14/2023)   Hunger Vital Sign    Worried About Running Out of Food in the Last Year: Never true    Ran Out of Food in the Last Year: Never true  Transportation Needs: No Transportation Needs (07/14/2023)   PRAPARE - Administrator, Civil Service (Medical): No    Lack of Transportation (Non-Medical): No  Physical Activity: Not on file  Stress: Not on file  Social Connections: Socially Integrated (07/14/2023)   Social Connection and Isolation Panel    Frequency of Communication with Friends and Family: More than three times a week    Frequency of Social Gatherings with Friends and Family: More than three times a week    Attends Religious Services: More than 4 times per  year    Active Member of [employer] or Organizations: Yes    Attends Banker Meetings: 1 to 4 times per year    Marital Status: Living with partner  Intimate Partner Violence: Not At Risk (07/14/2023)   Humiliation, Afraid, Rape, and Kick questionnaire    Fear of Current or Ex-Partner: No    Emotionally Abused: No    Physically Abused: No    Sexually Abused: No     Code Status   Code Status: Full Code  Review of Systems: All systems reviewed and negative except where noted in HPI.  Physical Exam: Vital signs in last 24 hours: Temp:  [98.4 F (36.9 C)-99 F (37.2 C)] 98.6 F (37 C) (07/13 0730) Pulse Rate:  [64-85] 79 (07/13 0800) Resp:  [11-23] 20 (07/13 0800) BP: (91-146)/(42-73) 138/59 (07/13 0800) SpO2:  [89 %-100 %] 94 % (07/13 0800) Weight:  [80.1 kg] 80.1 kg (07/13 0500) Last BM Date : 07/14/23  General:  Pleasant male in NAD Psych:  Cooperative. Normal mood and affect Eyes: Pupils equal Ears:  Normal auditory acuity Nose: No deformity, discharge or lesions Neck:  Supple, no masses felt Lungs:  Clear to auscultation.  Heart:  Regular rate, regular rhythm.  Abdomen:  Soft, nondistended, nontender, active bowel sounds, no masses felt Rectal :  Deferred Msk: Symmetrical without gross deformities.  Neurologic:  Alert, oriented, grossly normal neurologically Extremities : No edema Skin:  Intact without significant lesions.    Intake/Output from previous day: 07/12 0701 - 07/13 0700 In: 913.6 [I.V.:591.5; Blood:322.1] Out: 50 [Urine:50] Intake/Output this shift:  Total I/O In: 435 [P.O.:120; Blood:315] Out: -    Vina Dasen, NP-C   07/15/2023, 8:49 AM  I have taken an interval history, thoroughly reviewed the chart and examined the patient. I agree with the Advanced Practitioner's note, impression and recommendations, and have recorded additional findings, impressions and recommendations below. I performed a substantive portion of this encounter  (>50% time spent), including a complete performance of the medical decision making.  My additional thoughts are as follows:  Acute on chronic anemia.  Most likely underlying anemia chronic kidney disease with ESRD on HD, then acute blood loss from his AV fistula bleeding with an additional component of hemodilution since he is overdue for dialysis.  Other than  some diminished appetite since being on dialysis, he has no other clear localizing GI symptoms and certainly has not had overt GI bleeding.  As such, I suspect the risks of an upper endoscopy outweigh the expected benefits and I would not recommend that at this point.  Regular diet  Our service would be happy to come back and see him as the need arises.   Victory LITTIE [facility] Office:[phone]

## 2023-07-15 NOTE — Progress Notes (Signed)
    Subjective  -   No complaints   Physical Exam:  Fistula remains functional.  No active or further bleeding.  No ulcers.       Assessment/Plan:    ESRD: The patient has had no further bleeding from his fistula.  He has a history of stenting and subsequent venoplasty of a stenosis.  I suspect that this has contributed to his bleeding episodes.  I have him scheduled for fistulogram tomorrow  Anemia: Ongoing workup for anemia   Justin [name] 07/15/2023 1:17 PM --  Vitals:   07/15/23 1124 07/15/23 1240  BP:  (!) 105/52  Pulse:  75  Resp:  19  Temp: 98.4 F (36.9 C) 98.4 F (36.9 C)  SpO2:  98%    Intake/Output Summary (Last 24 hours) at 07/15/2023 1317 Last data filed at 07/15/2023 1100 Gross per 24 hour  Intake 832.81 ml  Output 125 ml  Net 707.81 ml     Laboratory CBC    Component Value Date/Time   WBC 7.8 07/15/2023 0322   HGB 5.6 (LL) 07/15/2023 0322   HGB 10.8 (L) 09/25/2022 0948   HCT 16.8 (L) 07/15/2023 0322   HCT 32.8 (L) 09/25/2022 0948   PLT 176 07/15/2023 0322   PLT 155 09/25/2022 0948    BMET    Component Value Date/Time   NA 132 (L) 07/15/2023 0322   NA 135 09/25/2022 0945   K 3.9 07/15/2023 0322   CL 90 (L) 07/15/2023 0322   CO2 25 07/15/2023 0322   GLUCOSE 132 (H) 07/15/2023 0322   BUN 66 (H) 07/15/2023 0322   BUN 40 (H) 09/25/2022 0945   CREATININE 11.76 (H) 07/15/2023 0322   CALCIUM  6.9 (L) 07/15/2023 0322   CALCIUM  7.9 (L) 04/29/2018 1350   GFRNONAA 4 (L) 07/15/2023 0322   GFRAA 6 (L) 01/13/2019 0324    COAG Lab Results  Component Value Date   INR 1.6 (H) 07/13/2023   INR 1.1 02/16/2023   INR 1.3 (H) 04/29/2018   No results found for: PTT  Antibiotics Anti-infectives (From admission, onward)    None        V. Malvina Serene CLORE, M.D., [facility] [place] Office: [phone] Pager:  [phone]

## 2023-07-16 ENCOUNTER — Encounter (HOSPITAL_COMMUNITY)
Admission: AD | Disposition: A | Discharge status: Home / Self Care | Payer: Self-pay | Source: Other Acute Inpatient Hospital | Attending: Internal Medicine

## 2023-07-16 DIAGNOSIS — T82858A Stenosis of vascular prosthetic devices, implants and grafts, initial encounter: Secondary | ICD-10-CM

## 2023-07-16 DIAGNOSIS — R578 Other shock: Secondary | ICD-10-CM | POA: Diagnosis not present

## 2023-07-16 HISTORY — PX: A/V FISTULAGRAM: CATH118298

## 2023-07-16 LAB — BPAM RBC
Blood Product Expiration Date: 202508102359
Blood Product Expiration Date: 202508102359
ISSUE DATE / TIME: 202507130429
ISSUE DATE / TIME: 202507130613
Unit Type and Rh: 5100
Unit Type and Rh: 5100

## 2023-07-16 LAB — CBC
HCT: 25.7 % — ABNORMAL LOW (ref 39.0–52.0)
Hemoglobin: 8.8 g/dL — ABNORMAL LOW (ref 13.0–17.0)
MCH: 29.3 pg (ref 26.0–34.0)
MCHC: 34.2 g/dL (ref 30.0–36.0)
MCV: 85.7 fL (ref 80.0–100.0)
Platelets: 204 K/uL (ref 150–400)
RBC: 3 MIL/uL — ABNORMAL LOW (ref 4.22–5.81)
RDW: 14.7 % (ref 11.5–15.5)
WBC: 10.3 K/uL (ref 4.0–10.5)
nRBC: 0 % (ref 0.0–0.2)

## 2023-07-16 LAB — GLUCOSE, CAPILLARY
Glucose-Capillary: 141 mg/dL — ABNORMAL HIGH (ref 70–99)
Glucose-Capillary: 146 mg/dL — ABNORMAL HIGH (ref 70–99)
Glucose-Capillary: 146 mg/dL — ABNORMAL HIGH (ref 70–99)
Glucose-Capillary: 145 mg/dL — ABNORMAL HIGH (ref 70–99)

## 2023-07-16 LAB — TYPE AND SCREEN
ABO/RH(D): O POS
Antibody Screen: NEGATIVE
Unit division: 0
Unit division: 0

## 2023-07-16 SURGERY — A/V FISTULAGRAM
Anesthesia: LOCAL | Laterality: Left

## 2023-07-16 MED ORDER — MIDAZOLAM HCL 2 MG/2ML IJ SOLN
INTRAMUSCULAR | Status: AC
  Filled 2023-07-16: qty 2

## 2023-07-16 MED ORDER — HEPARIN SODIUM (PORCINE) 1000 UNIT/ML DIALYSIS
1000.0000 [IU] | INTRAMUSCULAR | Status: DC | PRN
Start: 2023-07-16 — End: 2023-07-17

## 2023-07-16 MED ORDER — ALTEPLASE 2 MG IJ SOLR
2.0000 mg | Freq: Once | INTRAMUSCULAR | Status: DC | PRN
  Filled 2023-07-16: qty 2

## 2023-07-16 MED ORDER — ANTICOAGULANT SODIUM CITRATE 4% (200MG/5ML) IV SOLN
5.0000 mL | Status: DC | PRN
  Filled 2023-07-16: qty 5

## 2023-07-16 MED ORDER — HEPARIN (PORCINE) IN NACL 1000-0.9 UT/500ML-% IV SOLN
INTRAVENOUS | Status: DC | PRN
  Administered 2023-07-16: 500 mL

## 2023-07-16 MED ORDER — MIDAZOLAM HCL 2 MG/2ML IJ SOLN
INTRAMUSCULAR | Status: DC | PRN
  Administered 2023-07-16: 1 mg via INTRAVENOUS

## 2023-07-16 MED ORDER — LIDOCAINE HCL (PF) 1 % IJ SOLN: 5.0000 mL | INTRAMUSCULAR | Status: DC | PRN

## 2023-07-16 MED ORDER — LIDOCAINE-PRILOCAINE 2.5-2.5 % EX CREA: 1.0000 | TOPICAL_CREAM | CUTANEOUS | Status: DC | PRN

## 2023-07-16 MED ORDER — ALTEPLASE 2 MG IJ SOLR
2.0000 mg | Freq: Once | INTRAMUSCULAR | Status: DC | PRN
Start: 2023-07-16 — End: 2023-07-16

## 2023-07-16 MED ORDER — FENTANYL CITRATE (PF) 100 MCG/2ML IJ SOLN
INTRAMUSCULAR | Status: DC | PRN
  Administered 2023-07-16: 25 ug via INTRAVENOUS

## 2023-07-16 MED ORDER — METHOCARBAMOL 500 MG PO TABS: 500.0000 mg | ORAL_TABLET | Freq: Three times a day (TID) | ORAL | Status: DC | PRN

## 2023-07-16 MED ORDER — LIDOCAINE-PRILOCAINE 2.5-2.5 % EX CREA
1.0000 | TOPICAL_CREAM | CUTANEOUS | Status: DC | PRN
  Filled 2023-07-16: qty 5

## 2023-07-16 MED ORDER — LIDOCAINE 5 % EX PTCH
1.0000 | MEDICATED_PATCH | CUTANEOUS | Status: DC
  Administered 2023-07-16: 1 via TRANSDERMAL
  Filled 2023-07-16: qty 1

## 2023-07-16 MED ORDER — HEPARIN SODIUM (PORCINE) 1000 UNIT/ML DIALYSIS
1000.0000 [IU] | INTRAMUSCULAR | Status: DC | PRN
  Filled 2023-07-16: qty 1

## 2023-07-16 MED ORDER — NEPRO/CARBSTEADY PO LIQD: 237.0000 mL | ORAL | Status: DC | PRN

## 2023-07-16 MED ORDER — IODIXANOL 320 MG/ML IV SOLN
INTRAVENOUS | Status: DC | PRN
  Administered 2023-07-16: 50 mL via INTRAVENOUS

## 2023-07-16 MED ORDER — CHLORHEXIDINE GLUCONATE CLOTH 2 % EX PADS
6.0000 | MEDICATED_PAD | Freq: Every day | CUTANEOUS | Status: DC
  Administered 2023-07-17: 6 via TOPICAL

## 2023-07-16 MED ORDER — PENTAFLUOROPROP-TETRAFLUOROETH EX AERO
1.0000 | INHALATION_SPRAY | CUTANEOUS | Status: DC | PRN
Start: 2023-07-16 — End: 2023-07-17

## 2023-07-16 MED ORDER — LIDOCAINE HCL (PF) 1 % IJ SOLN
INTRAMUSCULAR | Status: AC
  Filled 2023-07-16: qty 30

## 2023-07-16 MED ORDER — FENTANYL CITRATE (PF) 100 MCG/2ML IJ SOLN
INTRAMUSCULAR | Status: AC
  Filled 2023-07-16: qty 2

## 2023-07-16 MED ORDER — ANTICOAGULANT SODIUM CITRATE 4% (200MG/5ML) IV SOLN: 5.0000 mL | Status: DC | PRN

## 2023-07-16 MED ORDER — PENTAFLUOROPROP-TETRAFLUOROETH EX AERO: 1.0000 | INHALATION_SPRAY | CUTANEOUS | Status: DC | PRN

## 2023-07-16 SURGICAL SUPPLY — 12 items
BAG SNAP BAND KOVER 36X36 (MISCELLANEOUS) ×1 IMPLANT
BALLOON MUSTANG 9X40X75 (BALLOONS) IMPLANT
COVER DOME SNAP 22 D (MISCELLANEOUS) ×1 IMPLANT
KIT ENCORE 26 ADVANTAGE (KITS) IMPLANT
KIT MICROPUNCTURE NIT STIFF (SHEATH) IMPLANT
SHEATH PINNACLE 6F 10CM (SHEATH) IMPLANT
SHEATH PROBE COVER 6X72 (BAG) ×1 IMPLANT
STATION PROTECTION PRESSURIZED (MISCELLANEOUS) ×1 IMPLANT
STOPCOCK MORSE 400PSI 3WAY (MISCELLANEOUS) ×1 IMPLANT
TRAY PV CATH (CUSTOM PROCEDURE TRAY) ×1 IMPLANT
TUBING CIL FLEX 10 FLL-RA (TUBING) ×1 IMPLANT
WIRE BENTSON .035X145CM (WIRE) IMPLANT

## 2023-07-16 NOTE — Op Note (Signed)
    Patient name: Justin [name] MRN: [id number] DOB: 1950-12-17 Sex: male  07/16/2023 Pre-operative Diagnosis: Bleeding from left arm fistula Post-operative diagnosis:  Same Surgeon:  Malvina New Procedure Performed:  1.  Ultrasound-guided access, left cephalic vein  2.  Fistulogram  3.  Peripheral balloon venoplasty  4.  Conscious sedation, 12 minutes   Indications: This is a 73 year old gentleman who was brought into the hospital for hemorrhagic shock likely secondary to bleeding from his dialysis cannulation sites.  He comes in today for further evaluation  Procedure:  The patient was identified in the holding area and taken to room 8.  The patient was then placed supine on the table and prepped and draped in the usual sterile fashion.  A time out was called.  Conscious sedation was administered with the use of IV fentanyl  and Versed  under continuous physician and nurse monitoring.  Heart rate, blood pressure, and oxygen  saturations were continuously monitored.  Total sedation time was 12 minutes ultrasound was used to evaluate the fistula.  The vein was patent and compressible.  A digital ultrasound image was acquired.  The fistula was then accessed under ultrasound guidance using a micropuncture needle.  An 018 wire was then asvanced without resistance and a micropuncture sheath was placed.  Contrast injections were then performed through the sheath.  Findings: No evidence of central venous stenosis.  The arteriovenous anastomosis is widely patent.  Numerous overlapping stents are visualized in the distal fistula going through the cephalic vein arch.  At the proximal and distal stent edges, there was approximately a 50% stenosis.  This was not clinically visualized on the proximal stent however it was very difficult to get a wire to pass.   Intervention: After the above images were acquired the decision was made to proceed with intervention.  A 6 French sheath was placed over a Bentson wire.   I had some difficulty getting the Bentson wire through the proximal portion of the stents but was ultimately able to do so.  I then selected a 9 x 40 balloon and performed balloon venoplasty of the cephalic vein at the proximal and distal stent edges.  Completion imaging revealed no residual stenosis.  There is a small size mismatch between the stents and the vein.  Balloon and wire were removed.  A 4-0 Monocryl was placed around the sheath which was removed.  There were no immediate complications  Impression:  #1  Successful balloon venoplasty of the proximal and distal stent edge stenosis using a 9 mm balloon  #2  Fistula remains amenable to future interventions    V. Malvina New, M.D., [facility] [place] Office: [phone] Pager:  [phone]

## 2023-07-16 NOTE — Progress Notes (Signed)
 [facility] Progress Note  Subjective:  Last HD on 7/13 with 4 kg UF.  also with 125 mL UOP over 7/13.  Spoke with significant other at beside - she had stopped the bleeding and called EMS this weekend.   They are hoping to find out what's causing his back/flank pain.  No imaging here this admit. S/p f'gram today with [facility].   Review of systems:  Back/Flank pain  Denies n/v Denies shortness of breath  Vitals:   07/16/23 0751 07/16/23 0831 07/16/23 1200 07/16/23 1648  BP:  124/62 (!) 156/98 (!) 151/64  Pulse:  96  89  Resp:  19 19 18   Temp:  98.7 F (37.1 C) 98.2 F (36.8 C) 98.2 F (36.8 C)  TempSrc:   Oral Oral  SpO2: 97% 95% 99% 98%  Weight:        Physical Exam:  General adult male in bed in no acute distress HEENT normocephalic atraumatic extraocular movements intact sclera anicteric Neck supple trachea midline Lungs clear to auscultation bilaterally normal work of breathing at rest  Heart S1S2 no rub Abdomen soft nontender nondistended Extremities no edema  Psych normal mood and affect Neuro - alert and oriented x 3 provides hx and follows commands Access LUE AVF with bruit and thrill    Home bp meds: Norvasc  10 mg daily at bedtime Hydralazine  50 mg twice daily, no am dose on hd days Imdur  30 mg once non-hd days Toprol  xl 50mg  every day      OP HD: TTS [place] 4h  B450   76.5kg  2K bath  L AVF   Heparin  none Last HD 7/8, post wt 76.9kg Gets to his dry wt, 2-3 L UF is average Mircera 200 mcg q 2 wks, last 7/01, due 7/15       Assessment/ Plan: Hypotension/ shock: d/t AVF blood loss, not sure if GI losses, possible sepsis. Hypotension resolved. [facility] improving w/o antibiotics here. W/u in progress per CCM. Possible GIB: s/p 2u prbc's . Per pmd, GI consulting. Getting PPI.  ESRD: on HD TTS.  S/p f'gram this AM HTN: bp's mostly stable here, holding anti-HTN's Volume: vol excess by wts and d/t missed HD. S/p HD as above Anemia of esrd: s/p PRBC's  as above.  On aranesp  200mcg sq weekly on Sundays. Iron  sats high.  Back pain - per primary team     disposition - per primary team   Recent Labs  Lab 07/13/23 2308 07/14/23 0259 07/15/23 0322 07/16/23 1032  HGB 8.0* 7.9* 5.6* 8.8*  ALBUMIN  2.4* 2.2*  --   --   CALCIUM  7.3* 7.5* 6.9*  --   PHOS 10.3* 7.3* 7.3*  --   CREATININE 10.58* 10.38* 11.76*  --   K 4.4 3.8 3.9  --    Recent Labs  Lab 07/15/23 1115  IRON  79  TIBC 123*  FERRITIN >7,500*   Inpatient medications:  Chlorhexidine  Gluconate Cloth  6 each Topical Daily   Chlorhexidine  Gluconate Cloth  6 each Topical Q0600   cyanocobalamin   1,000 mcg Oral Daily   darbepoetin (ARANESP ) injection - DIALYSIS  200 mcg Subcutaneous Q Sun-1800   insulin  aspart  0-15 Units Subcutaneous TID WC   insulin  aspart  0-5 Units Subcutaneous QHS   insulin  aspart  2 Units Subcutaneous TID WC   insulin  glargine-yfgn  6 Units Subcutaneous QHS   pantoprazole  (PROTONIX ) IV  40 mg Intravenous Q12H   rosuvastatin   40 mg Oral Daily    albumin  human  anticoagulant sodium citrate      acetaminophen , albumin  human, alteplase , anticoagulant sodium citrate , docusate sodium , feeding supplement (NEPRO CARB STEADY), heparin , lidocaine  (PF), lidocaine -prilocaine , methocarbamol , oxyCODONE -acetaminophen , pentafluoroprop-tetrafluoroeth, polyethylene glycol   Katheryn JAYSON Saba, MD 5:16 PM 07/16/2023

## 2023-07-16 NOTE — Progress Notes (Signed)
 Received patient in bed to unit.  Alert and oriented.  Informed consent signed and in chart.   TX duration: 3.5 hours  Patient tolerated well.  Transported back to the room  Alert, without acute distress.  Hand-off given to patient's nurse.   Access used: fistula Access issues: none  Total UF removed: 4000 ml Medication(s) given: none Post HD VS: 156/73 Post HD weight: 78 kg   07/16/23 0615  Vitals  Temp 98.2 F (36.8 C)  Temp Source Oral  BP (!) 156/73  MAP (mmHg) 97  BP Location Right Arm  BP Method Automatic  Patient Position (if appropriate) Lying  Pulse Rate (!) 106  ECG Heart Rate (!) 106  Resp 13  Weight 78 kg  Type of Weight Post-Dialysis  Oxygen  Therapy  SpO2 97 %  O2 Device Room Air  Patient Activity (if Appropriate) In bed  Pulse Oximetry Type Continuous  Post Treatment  Dialyzer Clearance Lightly streaked  Hemodialysis Intake (mL) 0 mL  Liters Processed 84  Fluid Removed (mL) 4000 mL  Tolerated HD Treatment Yes  Post-Hemodialysis Comments HD tx completed as expected, tolerated well, pt is stable.  AVG/AVF Arterial Site Held (minutes) 10 minutes  AVG/AVF Venous Site Held (minutes) 10 minutes  Note  Patient Observations pt is in bed resting  Fistula / Graft Left Upper arm Arteriovenous fistula  Placement Date/Time: 08/28/18 0814   Orientation: Left  Access Location: (c) Upper arm  Access Type: Arteriovenous fistula  Site Condition No complications  Fistula / Graft Assessment Present;Thrill;Bruit  Status Deaccessed  Drainage Description None      [facility]

## 2023-07-16 NOTE — Plan of Care (Signed)
  Problem: Education: Goal: Knowledge of General Education information will improve Description: Including pain rating scale, medication(s)/side effects and non-pharmacologic comfort measures Outcome: Progressing   Problem: Clinical Measurements: Goal: Will remain free from infection Outcome: Progressing   Problem: Coping: Goal: Level of anxiety will decrease Outcome: Progressing   Problem: Pain Managment: Goal: General experience of comfort will improve and/or be controlled Outcome: Progressing

## 2023-07-16 NOTE — Progress Notes (Signed)
 Transition of Care [facility]: [facility]) - Inpatient Brief Assessment   Patient Details  Name: ZAION HREHA MRN: [id number] Date of Birth: 01-06-1950  Transition of Care [facility]) CM/SW Contact:    Rosaline JONELLE Joe, RN Phone Number: 07/16/2023, 11:50 AM   Clinical Narrative: Patient admitted to the hospital for Hemorrhagic Shock with possible concurrent septic shock Acute Diarrhea, Bleeding AV fistula.  Patient was seen by vascular surgery today for fistulagram and peripheral balloon venoplasty.  No TOC needs at this time - CM will continue to follow the patient for [facility] needs as patient progresses.   Transition of Care Asessment: Insurance and Status: (P) Insurance coverage has been reviewed Patient has primary care physician: (P) Yes Home environment has been reviewed: (P) from home Prior level of function:: (P) self Prior/Current Home Services: (P) No current home services Social Drivers of Health Review: (P) SDOH reviewed no interventions necessary Readmission risk has been reviewed: (P) Yes Transition of care needs: (P) no transition of care needs at this time

## 2023-07-16 NOTE — Progress Notes (Signed)
  Progress Note   Patient: Justin [name] FMW:[id number] DOB: 04/28/1950 DOA: 07/13/2023     3 DOS: the patient was seen and examined on 07/16/2023   Brief hospital course:  73 y.o. M with PMH significant for ESRD on dialysis T/Th/Sat, CHF, TCAD, AS, ype 2 DM who has presented to [facility] with significant bleeding from his LUE AV fistula.  He states over the last three weeks he has had bleeding at every dialysis session.  He had a fall today, but says this was a slide from sitting and he did not hit his arm. Of note, he was admitted in 04/2023 and had a fistulogram and angioplasty of the fistula due to stenosis. S/p Ultrasound guided access, left cephalic vein, fistulogram, peripheral balloon venoplasty done by IR on 7/14.   Assessment and Plan:   Hemorrhagic Shock with possible concurrent septic shock, no clear septic source Bleeding Left arm AV fistula Acute blood loss anemia: Received 2 units of PRBCs on 7/13.   Shock has resolved S/p Ultrasound guided access, left cephalic vein, fistulogram, peripheral balloon venoplasty done by IR on 7/14.  ESRD Anion gap metabolic acidosis: TTS on HD Nephrology on board Rest of the management as per nephrology LUE fistula in place  Concern for GI Bleed: GI on board Dark stools are in the setting of chronic iron  use No GI procedures indicated at this time.   Type 2 diabetes mellitus,POA: -Continue SSI   Hypertension: Close monitoring  Chronic HFpEF,POA: NYHA III: No acute issues  CAD,POA: No acute issues  Severe AS: Out patient follow up with cardiology.  Disposition: Home     Subjective: No acute events overnight. Received two units of PRBCs yesterday. Plan for fistulogram to be done today. Complaining of left sided flank pain which has been going on since Thursday.  Physical Exam: Vitals:   07/16/23 0615 07/16/23 0751 07/16/23 0831 07/16/23 1200  BP: (!) 156/73  124/62 (!) 156/98  Pulse: (!) 106  96   Resp: 13  19 19    Temp: 98.2 F (36.8 C)  98.7 F (37.1 C) 98.2 F (36.8 C)  TempSrc: Oral   Oral  SpO2: 97% 97% 95% 99%  Weight: 78 kg      Constitutional: NAD, calm, comfortable Eyes: PERRL, lids and conjunctivae normal ENMT: Mucous membranes are moist. Posterior pharynx clear of any exudate or lesions.Normal dentition.  Neck: normal, supple, no masses, no thyromegaly Respiratory: clear to auscultation bilaterally, no wheezing, no crackles. Normal respiratory effort. No accessory muscle use.  Cardiovascular: Regular rate and rhythm, no murmurs / rubs / gallops. No extremity edema. 2+ pedal pulses. No carotid bruits.  Abdomen: no tenderness, no masses palpated. No hepatosplenomegaly. Bowel sounds positive.  Musculoskeletal: no clubbing / cyanosis. No joint deformity upper and lower extremities. Good ROM, no contractures. Normal muscle tone. LUE fistula in place Skin: no rashes, lesions, ulcers. No induration Neurologic: CN 2-12 grossly intact. Sensation intact, DTR normal. Strength 5/5 x all 4 extremities.  Psychiatric: Normal judgment and insight. Alert and oriented x 3. Normal mood.   Data Reviewed:  There are no new results to review at this time.  Family Communication: Girl friend at bedside  Disposition: Status is: Inpatient Remains inpatient appropriate because: Hemorrhagic shock, anemia  Planned Discharge Destination: Home with Home Health    Time spent: 43 minutes  Author: Deliliah Room, MD 07/16/2023 12:27 PM  For on call review [email or web address].

## 2023-07-16 NOTE — Care Management Important Message (Signed)
 Important Message  Patient Details  Name: Justin [name] MRN: [id number] Date of Birth: 11/08/1950   Important Message Given:  Yes - Medicare IM     Claretta Deed 07/16/2023, 3:35 PM

## 2023-07-17 ENCOUNTER — Encounter (HOSPITAL_COMMUNITY): Payer: Self-pay | Admitting: Surgery

## 2023-07-17 ENCOUNTER — Inpatient Hospital Stay (HOSPITAL_COMMUNITY)

## 2023-07-17 DIAGNOSIS — R578 Other shock: Secondary | ICD-10-CM | POA: Diagnosis not present

## 2023-07-17 LAB — BASIC METABOLIC PANEL WITH GFR
Anion gap: 19 — ABNORMAL HIGH (ref 5–15)
BUN: 40 mg/dL — ABNORMAL HIGH (ref 8–23)
CO2: 22 mmol/L (ref 22–32)
Calcium: 8.3 mg/dL — ABNORMAL LOW (ref 8.9–10.3)
Chloride: 91 mmol/L — ABNORMAL LOW (ref 98–111)
Creatinine, Ser: 8.54 mg/dL — ABNORMAL HIGH (ref 0.61–1.24)
GFR, Estimated: 6 mL/min — ABNORMAL LOW (ref >60)
Glucose, Bld: 121 mg/dL — ABNORMAL HIGH (ref 70–99)
Potassium: 4.4 mmol/L (ref 3.5–5.1)
Sodium: 132 mmol/L — ABNORMAL LOW (ref 135–145)

## 2023-07-17 LAB — CBC WITH DIFFERENTIAL/PLATELET
Abs Immature Granulocytes: 0.1 K/uL — ABNORMAL HIGH (ref 0.00–0.07)
Basophils Absolute: 0.1 K/uL (ref 0.0–0.1)
Basophils Relative: 1 %
Eosinophils Absolute: 0.1 K/uL (ref 0.0–0.5)
Eosinophils Relative: 1 %
HCT: 28.8 % — ABNORMAL LOW (ref 39.0–52.0)
Hemoglobin: 9.3 g/dL — ABNORMAL LOW (ref 13.0–17.0)
Immature Granulocytes: 1 %
Lymphocytes Relative: 7 %
Lymphs Abs: 0.8 K/uL (ref 0.7–4.0)
MCH: 29 pg (ref 26.0–34.0)
MCHC: 32.3 g/dL (ref 30.0–36.0)
MCV: 89.7 fL (ref 80.0–100.0)
Monocytes Absolute: 0.9 K/uL (ref 0.1–1.0)
Monocytes Relative: 8 %
Neutro Abs: 9 K/uL — ABNORMAL HIGH (ref 1.7–7.7)
Neutrophils Relative %: 82 %
Platelets: 235 K/uL (ref 150–400)
RBC: 3.21 MIL/uL — ABNORMAL LOW (ref 4.22–5.81)
RDW: 14.4 % (ref 11.5–15.5)
WBC: 10.9 K/uL — ABNORMAL HIGH (ref 4.0–10.5)
nRBC: 0 % (ref 0.0–0.2)

## 2023-07-17 LAB — GLUCOSE, CAPILLARY
Glucose-Capillary: 229 mg/dL — ABNORMAL HIGH (ref 70–99)
Glucose-Capillary: 177 mg/dL — ABNORMAL HIGH (ref 70–99)
Glucose-Capillary: 126 mg/dL — ABNORMAL HIGH (ref 70–99)

## 2023-07-17 LAB — HEPATITIS B SURFACE ANTIBODY, QUANTITATIVE: Hep B S AB Quant (Post): <3.5 m[IU]/mL — ABNORMAL LOW

## 2023-07-17 MED ORDER — OXYCODONE HCL ER 10 MG PO T12A
10.0000 mg | EXTENDED_RELEASE_TABLET | Freq: Two times a day (BID) | ORAL | Status: DC
  Administered 2023-07-17 – 2023-07-18 (×2): 10 mg via ORAL
  Filled 2023-07-17 (×3): qty 1

## 2023-07-17 MED ORDER — METOPROLOL SUCCINATE ER 50 MG PO TB24
50.0000 mg | ORAL_TABLET | Freq: Every day | ORAL | Status: DC
  Administered 2023-07-18: 50 mg via ORAL
  Filled 2023-07-17: qty 1

## 2023-07-17 NOTE — Plan of Care (Signed)
  Problem: Education: Goal: Knowledge of General Education information will improve Description: Including pain rating scale, medication(s)/side effects and non-pharmacologic comfort measures Outcome: Progressing   Problem: Activity: Goal: Risk for activity intolerance will decrease Outcome: Progressing   Problem: Coping: Goal: Level of anxiety will decrease Outcome: Progressing   Problem: Elimination: Goal: Will not experience complications related to bowel motility Outcome: Progressing Goal: Will not experience complications related to urinary retention Outcome: Progressing   Problem: Safety: Goal: Ability to remain free from injury will improve Outcome: Progressing   Problem: Skin Integrity: Goal: Risk for impaired skin integrity will decrease Outcome: Progressing   Problem: Fluid Volume: Goal: Ability to maintain a balanced intake and output will improve Outcome: Progressing   Problem: Metabolic: Goal: Ability to maintain appropriate glucose levels will improve Outcome: Progressing   Problem: Skin Integrity: Goal: Risk for impaired skin integrity will decrease Outcome: Progressing   Problem: Tissue Perfusion: Goal: Adequacy of tissue perfusion will improve Outcome: Progressing

## 2023-07-17 NOTE — Progress Notes (Signed)
 [facility] Progress Note  Subjective:  Last HD on 7/13 with 4 kg UF.   There was no UOP charted over 7/14.  He is frustrated about his back/left flank pain.  This is a little better with meds but then recurs and he wants to know what's going on with this.     Review of systems:    Back/left Flank pain  Denies n/v Denies shortness of breath  Vitals:   07/16/23 1200 07/16/23 1648 07/16/23 2106 07/16/23 2312  BP: (!) 156/98 (!) 151/64 (!) 172/56 (!) 176/67  Pulse:  89 72 81  Resp: 19 18 18 18   Temp: 98.2 F (36.8 C) 98.2 F (36.8 C) 98.3 F (36.8 C) 98.1 F (36.7 C)  TempSrc: Oral Oral Oral Oral  SpO2: 99% 98% 99% 100%  Weight:        Physical Exam:    General adult male in bed in no acute distress HEENT normocephalic atraumatic extraocular movements intact sclera anicteric Neck supple trachea midline Lungs clear to auscultation bilaterally normal work of breathing at rest on room air Heart S1S2 no rub Abdomen soft nontender nondistended Extremities no edema  Psych normal mood and affect Neuro - alert and oriented x 3 provides hx and follows commands Access LUE AVF with bruit and thrill    Home bp meds: Norvasc  10 mg daily at bedtime Hydralazine  50 mg twice daily, no am dose on hd days Imdur  30 mg once non-hd days Toprol  xl 50mg  every day      OP HD: TTS  4h  B450   76.5kg  2K bath  L AVF   Heparin  none Last HD 7/8, post wt 76.9kg Gets to his dry wt, 2-3 L UF is average Mircera 200 mcg q 2 wks, last 7/01, due 7/15       Assessment/ Plan: Hypotension/ shock: d/t AVF blood loss, and possible GI losses.  Hypotension resolved. [facility] improving w/o antibiotics here.  Per primary team  Possible GIB: s/p 2u prbc's . Per primary team; GI consulted. Getting PPI.  Per charting, dark stools in setting of iron  use.  ESRD: on HD TTS.  S/p f'gram on 7/14 with vascular HTN: previously off of anti-hypertensives due to hypotension. Will add back metoprolol   succinate first and would anticipate amlodipine  next.  Plan for HD today  Volume: vol excess by wts and d/t missed HD. S/p HD as above Anemia of esrd: s/p PRBC's as above.  On aranesp  200mcg sq weekly on Sundays. Iron  sats high.  Back/left flank pain - per primary team.  I have reached out to the team about the same.     disposition - per primary team   Recent Labs  Lab 07/13/23 2308 07/14/23 0259 07/15/23 0322 07/16/23 1032 07/17/23 0420  HGB 8.0* 7.9* 5.6* 8.8* 9.3*  ALBUMIN  2.4* 2.2*  --   --   --   CALCIUM  7.3* 7.5* 6.9*  --   --   PHOS 10.3* 7.3* 7.3*  --   --   CREATININE 10.58* 10.38* 11.76*  --   --   K 4.4 3.8 3.9  --   --    Recent Labs  Lab 07/15/23 1115  IRON  79  TIBC 123*  FERRITIN >7,500*   Inpatient medications:  Chlorhexidine  Gluconate Cloth  6 each Topical Daily   Chlorhexidine  Gluconate Cloth  6 each Topical Q0600   Chlorhexidine  Gluconate Cloth  6 each Topical Q0600   cyanocobalamin   1,000 mcg Oral Daily  darbepoetin (ARANESP ) injection - DIALYSIS  200 mcg Subcutaneous Q Sun-1800   insulin  aspart  0-15 Units Subcutaneous TID WC   insulin  aspart  0-5 Units Subcutaneous QHS   insulin  aspart  2 Units Subcutaneous TID WC   insulin  glargine-yfgn  6 Units Subcutaneous QHS   lidocaine   1 patch Transdermal Q24H   pantoprazole  (PROTONIX ) IV  40 mg Intravenous Q12H   rosuvastatin   40 mg Oral Daily    albumin  human     anticoagulant sodium citrate      acetaminophen , albumin  human, alteplase , anticoagulant sodium citrate , docusate sodium , feeding supplement (NEPRO CARB STEADY), heparin , lidocaine  (PF), lidocaine -prilocaine , methocarbamol , oxyCODONE -acetaminophen , pentafluoroprop-tetrafluoroeth, polyethylene glycol   Katheryn JAYSON Saba, MD 6:42 AM 07/17/2023

## 2023-07-17 NOTE — Progress Notes (Signed)
   07/17/23 1846  Vitals  Temp 98.4 F (36.9 C)  Pulse Rate (!) 102  Resp 19  BP (!) 160/67  SpO2 100 %  Weight 76.2 kg  Type of Weight Post-Dialysis  Oxygen  Therapy  Patient Activity (if Appropriate) In bed  Pulse Oximetry Type Continuous  Oximetry Probe Site Changed No  Post Treatment  Dialyzer Clearance Lightly streaked  Liters Processed 72  Fluid Removed (mL) 4000 mL  Tolerated HD Treatment Yes  AVG/AVF Arterial Site Held (minutes) 10 minutes  AVG/AVF Venous Site Held (minutes) 10 minutes   Received patient in bed to unit.  Alert and oriented.  Informed consent signed and in chart.   TX duration:3  Patient tolerated well.  Transported back to the room  Alert, without acute distress.  Hand-off given to patient's nurse.   Access used: Yes Access issues: No  Total UF removed: 4000 Medication(s) given: No Meds Given Post HD VS: See Above Grid Post HD weight: 76.2 kg   [name]

## 2023-07-17 NOTE — Progress Notes (Signed)
  Progress Note    07/17/2023 7:59 AM 1 Day Post-Op  Subjective:  denies any pain in his left arm or hand. Expresses frustration regarding the techs at his outpatient HD center for treating him like a [place] pig. Still having back and flank pain. Per RN headed for CT scan to evaluative flank pain. HD planned for later today   Vitals:   07/16/23 2106 07/16/23 2312  BP: (!) 172/56 (!) 176/67  Pulse: 72 81  Resp: 18 18  Temp: 98.3 F (36.8 C) 98.1 F (36.7 C)  SpO2: 99% 100%   Physical Exam: Cardiac:  regular Lungs:  non labored Extremities: Left arm fistula with great thrill. Mild oozing from HD stick site distally. Gauze dressings clean and dry Neurologic: alert and oriented  CBC    Component Value Date/Time   WBC 10.9 (H) 07/17/2023 0420   RBC 3.21 (L) 07/17/2023 0420   HGB 9.3 (L) 07/17/2023 0420   HGB 10.8 (L) 09/25/2022 0948   HCT 28.8 (L) 07/17/2023 0420   HCT 32.8 (L) 09/25/2022 0948   PLT 235 07/17/2023 0420   PLT 155 09/25/2022 0948   MCV 89.7 07/17/2023 0420   MCV 95 09/25/2022 0948   MCH 29.0 07/17/2023 0420   MCHC 32.3 07/17/2023 0420   RDW 14.4 07/17/2023 0420   RDW 13.8 09/25/2022 0948   LYMPHSABS 0.8 07/17/2023 0420   MONOABS 0.9 07/17/2023 0420   EOSABS 0.1 07/17/2023 0420   BASOSABS 0.1 07/17/2023 0420    BMET    Component Value Date/Time   NA 132 (L) 07/15/2023 0322   NA 135 09/25/2022 0945   K 3.9 07/15/2023 0322   CL 90 (L) 07/15/2023 0322   CO2 25 07/15/2023 0322   GLUCOSE 132 (H) 07/15/2023 0322   BUN 66 (H) 07/15/2023 0322   BUN 40 (H) 09/25/2022 0945   CREATININE 11.76 (H) 07/15/2023 0322   CALCIUM  6.9 (L) 07/15/2023 0322   CALCIUM  7.9 (L) 04/29/2018 1350   GFRNONAA 4 (L) 07/15/2023 0322   GFRAA 6 (L) 01/13/2019 0324    INR    Component Value Date/Time   INR 1.6 (H) 07/13/2023 2308    No intake or output data in the 24 hours ending 07/17/23 0759   Assessment/Plan:  73 y.o. male is s/p LUE fistulogram 1 Day Post-Op    Venoplasty of cephalic vein Left AV fistula with good thrill  Scheduled for HD later today Vascular available if any issues with fistula   Teretha Damme, PA-C [facility] [phone] 07/17/2023 7:59 AM

## 2023-07-17 NOTE — Progress Notes (Signed)
  Progress Note   Patient: Justin [name] FMW:[id number] DOB: 08-01-1950 DOA: 07/13/2023     4 DOS: the patient was seen and examined on 07/17/2023   Brief hospital course:  73 y.o. M with PMH significant for ESRD on dialysis T/Th/Sat, CHF, TCAD, AS, ype 2 DM who has presented to [facility] with significant bleeding from his LUE AV fistula.  He states over the last three weeks he has had bleeding at every dialysis session.  He had a fall today, but says this was a slide from sitting and he did not hit his arm. Of note, he was admitted in 04/2023 and had a fistulogram and angioplasty of the fistula due to stenosis. S/p Ultrasound guided access, left cephalic vein, fistulogram, peripheral balloon venoplasty done by IR on 7/14.   Assessment and Plan:   Hemorrhagic Shock with possible concurrent septic shock, no clear septic source Bleeding Left arm AV fistula Acute blood loss anemia: Received 2 units of PRBCs on 7/13.   Shock has resolved S/p Ultrasound guided access, left cephalic vein, fistulogram, peripheral balloon venoplasty done by IR on 7/14.  ESRD Anion gap metabolic acidosis: TTS on HD Nephrology on board Rest of the management as per nephrology LUE fistula in place  Concern for GI Bleed: GI on board Dark stools are in the setting of chronic iron  use No GI procedures indicated at this time.   Type 2 diabetes mellitus,POA: -Continue SSI   Hypertension: Close monitoring  Chronic HFpEF,POA: NYHA III: No acute issues  CAD,POA: No acute issues  Severe AS: Out patient follow up with cardiology.  Disposition: Home     Subjective: No acute events overnight. Still complaining of left flank pain. CT abdomen/pelvis has been ordered.  Physical Exam: Vitals:   07/16/23 1200 07/16/23 1648 07/16/23 2106 07/16/23 2312  BP: (!) 156/98 (!) 151/64 (!) 172/56 (!) 176/67  Pulse:  89 72 81  Resp: 19 18 18 18   Temp: 98.2 F (36.8 C) 98.2 F (36.8 C) 98.3 F (36.8 C) 98.1 F  (36.7 C)  TempSrc: Oral Oral Oral Oral  SpO2: 99% 98% 99% 100%  Weight:       Constitutional: NAD, calm, comfortable Eyes: PERRL, lids and conjunctivae normal ENMT: Mucous membranes are moist. Posterior pharynx clear of any exudate or lesions.Normal dentition.  Neck: normal, supple, no masses, no thyromegaly Respiratory: clear to auscultation bilaterally, no wheezing, no crackles. Normal respiratory effort. No accessory muscle use.  Cardiovascular: Regular rate and rhythm, no murmurs / rubs / gallops. No extremity edema. 2+ pedal pulses. No carotid bruits.  Abdomen: no tenderness, no masses palpated. No hepatosplenomegaly. Bowel sounds positive.  Musculoskeletal: no clubbing / cyanosis. No joint deformity upper and lower extremities. Good ROM, no contractures. Normal muscle tone. LUE fistula in place Skin: no rashes, lesions, ulcers. No induration Neurologic: CN 2-12 grossly intact. Sensation intact, DTR normal. Strength 5/5 x all 4 extremities.  Psychiatric: Normal judgment and insight. Alert and oriented x 3. Normal mood.   Data Reviewed:  There are no new results to review at this time.  Family Communication: None at bedside  Disposition: Status is: Inpatient Remains inpatient appropriate because: Hemorrhagic shock, anemia  Planned Discharge Destination: Home with Home Health    Time spent: 43 minutes  Author: Deliliah Room, MD 07/17/2023 9:08 AM  For on call review [email or web address].

## 2023-07-18 DIAGNOSIS — R578 Other shock: Secondary | ICD-10-CM | POA: Diagnosis not present

## 2023-07-18 LAB — BLOOD CULTURE ID PANEL (REFLEXED) - BCID2

## 2023-07-18 LAB — CBC
HCT: 29.5 % — ABNORMAL LOW (ref 39.0–52.0)
Hemoglobin: 9.8 g/dL — ABNORMAL LOW (ref 13.0–17.0)
MCH: 28.9 pg (ref 26.0–34.0)
MCHC: 33.2 g/dL (ref 30.0–36.0)
MCV: 87 fL (ref 80.0–100.0)
Platelets: 264 K/uL (ref 150–400)
RBC: 3.39 MIL/uL — ABNORMAL LOW (ref 4.22–5.81)
RDW: 14.3 % (ref 11.5–15.5)
WBC: 9.3 K/uL (ref 4.0–10.5)
nRBC: 0 % (ref 0.0–0.2)

## 2023-07-18 LAB — RENAL FUNCTION PANEL
Albumin: 2.7 g/dL — ABNORMAL LOW (ref 3.5–5.0)
Anion gap: 13 (ref 5–15)
BUN: 23 mg/dL (ref 8–23)
CO2: 28 mmol/L (ref 22–32)
Calcium: 8.5 mg/dL — ABNORMAL LOW (ref 8.9–10.3)
Chloride: 90 mmol/L — ABNORMAL LOW (ref 98–111)
Creatinine, Ser: 6.33 mg/dL — ABNORMAL HIGH (ref 0.61–1.24)
GFR, Estimated: 9 mL/min — ABNORMAL LOW (ref >60)
Glucose, Bld: 173 mg/dL — ABNORMAL HIGH (ref 70–99)
Phosphorus: 4.9 mg/dL — ABNORMAL HIGH (ref 2.5–4.6)
Potassium: 3.6 mmol/L (ref 3.5–5.1)
Sodium: 131 mmol/L — ABNORMAL LOW (ref 135–145)

## 2023-07-18 LAB — CULTURE, BLOOD (ROUTINE X 2)
Culture: NO GROWTH
Special Requests: ADEQUATE

## 2023-07-18 LAB — GLUCOSE, CAPILLARY
Glucose-Capillary: 182 mg/dL — ABNORMAL HIGH (ref 70–99)
Glucose-Capillary: 126 mg/dL — ABNORMAL HIGH (ref 70–99)

## 2023-07-18 MED ORDER — OXYCODONE-ACETAMINOPHEN 5-325 MG PO TABS: 1.0000 | ORAL_TABLET | Freq: Three times a day (TID) | ORAL | 0 refills | Status: DC | PRN

## 2023-07-18 MED ORDER — AMLODIPINE BESYLATE 10 MG PO TABS: 5.0000 mg | ORAL_TABLET | Freq: Every day | ORAL | 0 refills | Status: DC

## 2023-07-18 MED ORDER — HYDRALAZINE HCL 25 MG PO TABS: 25.0000 mg | ORAL_TABLET | Freq: Three times a day (TID) | ORAL | Status: AC

## 2023-07-18 MED ORDER — AMLODIPINE BESYLATE 5 MG PO TABS
5.0000 mg | ORAL_TABLET | Freq: Every day | ORAL | Status: DC
  Administered 2023-07-18: 5 mg via ORAL
  Filled 2023-07-18: qty 1

## 2023-07-18 MED ORDER — METHOCARBAMOL 500 MG PO TABS: 500.0000 mg | ORAL_TABLET | Freq: Three times a day (TID) | ORAL | 0 refills | Status: DC | PRN

## 2023-07-18 MED FILL — Lidocaine HCl Local Preservative Free (PF) Inj 1%: INTRAMUSCULAR | Qty: 30 | Status: AC

## 2023-07-18 NOTE — Progress Notes (Deleted)
  Progress Note   Patient: Justin [name] FMW:[id number] DOB: 11-19-1950 DOA: 07/13/2023     5 DOS: the patient was seen and examined on 07/18/2023   Brief hospital course:  73 y.o. M with PMH significant for ESRD on dialysis T/Th/Sat, CHF, TCAD, AS, type 2 DM who has presented to [facility] with significant bleeding from his LUE AV fistula.  He states over the last three weeks he has had bleeding at every dialysis session. Of note, he was admitted in 04/2023 and had a fistulogram and angioplasty of the fistula due to stenosis. S/p Ultrasound guided access, left cephalic vein, fistulogram, peripheral balloon venoplasty done by IR on 7/14. He was complaining of left flank pain so a CT abdomen was done on 7/15 which was unremarkable.   Assessment and Plan:   Hemorrhagic Shock with possible concurrent septic shock, no clear septic source Bleeding Left arm AV fistula Acute blood loss anemia: Received 2 units of PRBCs on 7/13.   Shock has resolved S/p Ultrasound guided access, left cephalic vein, fistulogram, peripheral balloon venoplasty done by IR on 7/14.  ESRD Anion gap metabolic acidosis: Resolved now TTS on HD Nephrology on board Rest of the management as per nephrology LUE fistula in place Last HD was on 7/15.  Concern for GI Bleed: GI on board Dark stools are in the setting of chronic iron  use No GI procedures indicated at this time. Hb stable  Type 2 diabetes mellitus,POA: -Continue SSI   Hypertension: Close monitoring  Chronic HFpEF,POA: NYHA III: No acute issues  CAD,POA: No acute issues  Severe AS: Out patient follow up with cardiology.  Disposition: Home     Subjective: No acute events overnight. We discussed results of CT abdomen/pelvis which was done yesterday.  Physical Exam: Vitals:   07/18/23 0028 07/18/23 0353 07/18/23 0500 07/18/23 0735  BP: (!) 171/72 (!) 168/58  (!) 168/63  Pulse: 96 75  72  Resp: 20 20  18   Temp: 100.1 F (37.8 C) 98.3 F  (36.8 C)  97.9 F (36.6 C)  TempSrc:    Oral  SpO2: 98% 100%  100%  Weight:   76.5 kg    Constitutional: NAD, calm, comfortable Eyes: PERRL, lids and conjunctivae normal ENMT: Mucous membranes are moist. Posterior pharynx clear of any exudate or lesions.Normal dentition.  Neck: normal, supple, no masses, no thyromegaly Respiratory: clear to auscultation bilaterally, no wheezing, no crackles. Normal respiratory effort. No accessory muscle use.  Cardiovascular: Regular rate and rhythm, no murmurs / rubs / gallops. No extremity edema. 2+ pedal pulses. No carotid bruits.  Abdomen: no tenderness, no masses palpated. No hepatosplenomegaly. Bowel sounds positive.  Musculoskeletal: no clubbing / cyanosis. No joint deformity upper and lower extremities. Good ROM, no contractures. Normal muscle tone. LUE fistula in place Skin: no rashes, lesions, ulcers. No induration Neurologic: CN 2-12 grossly intact. Sensation intact, DTR normal. Strength 5/5 x all 4 extremities.  Psychiatric: Normal judgment and insight. Alert and oriented x 3. Normal mood.   Data Reviewed:  There are no new results to review at this time.  Family Communication: None at bedside  Disposition: Status is: Inpatient Remains inpatient appropriate because: Hemorrhagic shock, anemia  Planned Discharge Destination: Home with Home Health    Time spent: 41 minutes  Author: Deliliah Room, MD 07/18/2023 9:17 AM  For on call review [email or web address].

## 2023-07-18 NOTE — Discharge Planning (Signed)
 [facility] Patient Dialysis Discharge Orders - [facility] CLINIC: [place]  Patient's name: Justin [name] Admit/DC Dates: 07/13/2023 - 07/18/23  DISCHARGE DIAGNOSES: Hemorrhagic shock/hypotension  AVF hemorrhage Possible GI bleed -> dark stools, resolved. ?iron  use L flank pain -> w/u negative  HD ORDER CHANGES: Heparin  change: no EDW Change: YES --> 76kg Bath Change: no  ANEMIA MANAGEMENT: Aranesp : Given: YES   Amount/Date of last dose: 200mcg given 7/13 ESA dose for discharge: mircera 150mcg IV q 2 weeks, to start on 7/20 IV Iron  dose at discharge: per protocol Transfusion: Given: YES - 2U PRBCs on 7/13  BONE/MINERAL MEDICATIONS: Hectorol/Calcitriol  change: no Sensipar/Parsabiv change: no  ACCESS INTERVENTION/CHANGE: YES - S/p IR fistulogram/plasty 07/16/23  RECENT LABS: Recent Labs  Lab 07/18/23 0846  HGB 9.8*  NA 131*  K 3.6  CALCIUM  8.5*  PHOS 4.9*  ALBUMIN  2.7*   IV ANTIBIOTICS: no Details:  OTHER ANTICOAGULATION: no  OTHER/APPTS/LAB ORDERS:   D/C Meds to be reconciled by nurse after every discharge.  Completed By: Izetta Boehringer, PA-C [facility] Pager [phone]   Reviewed by: MD:______ RN_______

## 2023-07-18 NOTE — Progress Notes (Signed)
 [name] Progress Note  Subjective:    Last HD on 7/15 with 4 kg UF.    He had 100 mL uop over 7/16 and 1 unmeasured urine void.  Still uncomfortable - team did a CT yesterday for flank pain as below.   Review of systems:     Back/left Flank pain  Denies n/v Denies shortness of breath  Vitals:   07/17/23 2014 07/18/23 0028 07/18/23 0353 07/18/23 0500  BP: (!) 149/65 (!) 171/72 (!) 168/58   Pulse: (!) 102 96 75   Resp: 17 20 20    Temp: 98.4 F (36.9 C) 100.1 F (37.8 C) 98.3 F (36.8 C)   TempSrc:      SpO2: 98% 98% 100%   Weight:    76.5 kg    Physical Exam:    General adult male in bed in no acute distress HEENT normocephalic atraumatic extraocular movements intact sclera anicteric Neck supple trachea midline Lungs clear to auscultation bilaterally normal work of breathing at rest on room air Heart S1S2 no rub Abdomen soft nontender nondistended Extremities no edema  Psych normal mood and affect Neuro - alert and oriented x 3 provides hx and follows commands Access LUE AVF with bruit and thrill    Home bp meds: Norvasc  10 mg daily at bedtime Hydralazine  50 mg twice daily, no am dose on hd days Imdur  30 mg once non-hd days Toprol  xl 50mg  every day      OP HD: TTS [place] 4h  B450   76.5kg  2K bath  L AVF   Heparin  none Last HD 7/8, post wt 76.9kg Gets to his dry wt, 2-3 L UF is average Mircera 200 mcg q 2 wks, last 7/01, due 7/15       Assessment/ Plan: Hypotension/ shock: d/t AVF blood loss, and possible GI losses.  Hypotension resolved. [name] improving w/o antibiotics here.  Per primary team  Possible GIB: s/p 2u prbc's . Per primary team; GI consulted. Getting PPI.  Per charting, dark stools in setting of iron  use.  ESRD:  on HD TTS.  S/p f'gram on 7/14 with vascular.   Ordered renal panel - will follow labs Note small pericardial effusion seen on CT Optimize volume status with HD HTN: previously off of anti-hypertensives here due to  hypotension. Added back metoprolol  succinate first on 7/15 and will now add back amlodipine  at 5 mg daily.    Anemia of esrd: s/p PRBC's as above.  On aranesp  200mcg sq weekly on Sundays. Iron  sats high.  Back/left flank pain - per primary team.  He had a CT stone study on 7/15 - Minimal cholelithiasis and stable incidental pancreatic tail cyst; mild wall thickening and inflammatory changes involving the cecum suggestive of infectious or inflammatory colitis     disposition - per primary team   Recent Labs  Lab 07/13/23 2308 07/14/23 0259 07/15/23 0322 07/16/23 1032 07/17/23 0420  HGB 8.0* 7.9* 5.6* 8.8* 9.3*  ALBUMIN  2.4* 2.2*  --   --   --   CALCIUM  7.3* 7.5* 6.9*  --  8.3*  PHOS 10.3* 7.3* 7.3*  --   --   CREATININE 10.58* 10.38* 11.76*  --  8.54*  K 4.4 3.8 3.9  --  4.4   Recent Labs  Lab 07/15/23 1115  IRON  79  TIBC 123*  FERRITIN >7,500*   Inpatient medications:  Chlorhexidine  Gluconate Cloth  6 each Topical Daily   Chlorhexidine  Gluconate Cloth  6 each Topical Q0600  Chlorhexidine  Gluconate Cloth  6 each Topical Q0600   cyanocobalamin   1,000 mcg Oral Daily   darbepoetin (ARANESP ) injection - DIALYSIS  200 mcg Subcutaneous Q Sun-1800   insulin  aspart  0-15 Units Subcutaneous TID WC   insulin  aspart  0-5 Units Subcutaneous QHS   insulin  aspart  2 Units Subcutaneous TID WC   insulin  glargine-yfgn  6 Units Subcutaneous QHS   lidocaine   1 patch Transdermal Q24H   metoprolol  succinate  50 mg Oral Daily   oxyCODONE   10 mg Oral Q12H   pantoprazole  (PROTONIX ) IV  40 mg Intravenous Q12H   rosuvastatin   40 mg Oral Daily    albumin  human     acetaminophen , albumin  human, docusate sodium , methocarbamol , oxyCODONE -acetaminophen , polyethylene glycol   [name] [name], MD 7:13 AM 07/18/2023

## 2023-07-18 NOTE — Plan of Care (Signed)
 Received a call from micro lab stating that 1 of 3 bottles anaerobic showing gram-positive cocci and BCID showing Staph aureus MSSA. Discussed the lab results with pharmacy and per discussion it is contaminated and there is no indication for IV antibiotic treatment.  Also per chart review and patient was admitted hemodynamically stable without evidence of hypotension and high temperature.  Clayton Bosserman, MD [facility] 07/19/2023, 12:43 AM

## 2023-07-18 NOTE — Plan of Care (Signed)
  Problem: Education: Goal: Knowledge of General Education information will improve Description: Including pain rating scale, medication(s)/side effects and non-pharmacologic comfort measures Outcome: Progressing   Problem: Clinical Measurements: Goal: Ability to maintain clinical measurements within normal limits will improve Outcome: Progressing Goal: Will remain free from infection Outcome: Progressing Goal: Diagnostic test results will improve Outcome: Progressing Goal: Respiratory complications will improve Outcome: Progressing Goal: Cardiovascular complication will be avoided Outcome: Progressing   Problem: Activity: Goal: Risk for activity intolerance will decrease Outcome: Progressing   Problem: Elimination: Goal: Will not experience complications related to bowel motility Outcome: Progressing Goal: Will not experience complications related to urinary retention Outcome: Progressing

## 2023-07-18 NOTE — Progress Notes (Signed)
 D/C noted. Contacted [facility] [employer] [facility] to be advised of pt's d/c today and that pt should resume care tomorrow.   Randine Mungo Renal Navigator [phone]

## 2023-07-18 NOTE — Discharge Summary (Signed)
 Physician Discharge Summary   Patient: Justin [name] MRN: [id number] DOB: Jan 05, 1950  Admit date:     07/13/2023  Discharge date: 07/18/23  Discharge Physician: Justin [name]   PCP: Justin Senior, MD   Recommendations at discharge:    Follow up with your PCP in one week HS as per schedule TTS Continue taking meds as prescribed  Discharge Diagnoses: Principal Problem:   Hemorrhagic shock [facility])  Hospital Course:  73 y.o. M with PMH significant for ESRD on dialysis T/Th/Sat, CHF, TCAD, AS, ype 2 DM who has presented to [facility] with significant bleeding from his LUE AV fistula.  He states over the last three weeks he has had bleeding at every dialysis session.  He had a fall today, but says this was a slide from sitting and he did not hit his arm. Of note, he was admitted in 04/2023 and had a fistulogram and angioplasty of the fistula due to stenosis. S/p Ultrasound guided access, left cephalic vein, fistulogram, peripheral balloon venoplasty done by IR on 7/14.   Hemorrhagic Shock with possible concurrent septic shock, no clear septic source Bleeding Left arm AV fistula, resolved Acute blood loss anemia: Received 2 units of PRBCs on 7/13.    Shock has resolved S/p Ultrasound guided access, left cephalic vein, fistulogram, peripheral balloon venoplasty done by IR on 7/14.   ESRD Anion gap metabolic acidosis: TTS on HD Nephrology on board Rest of the management as per nephrology LUE fistula in place   Concern for GI Bleed: GI on board Dark stools are in the setting of chronic iron  use No GI procedures indicated at this time.     Type 2 diabetes mellitus,POA: -Continue home regimen   Hypertension: Close monitoring   Chronic HFpEF,POA: NYHA III: No acute issues   CAD,POA: No acute issues   Severe AS: Out patient follow up with cardiology.   Disposition: Home        Consultants: Nephrology, IR Procedures performed: Ultrasound guided access, left cephalic  vein, fistulogram, peripheral balloon venoplasty done by IR on 7/14.   Disposition: Home Diet recommendation:  Renal diet DISCHARGE MEDICATION: Allergies as of 07/18/2023   No Known Allergies      Medication List     TAKE these medications    acetaminophen  500 MG tablet Commonly known as: TYLENOL  Take 2 tablets (1,000 mg total) by mouth every 6 (six) hours as needed for mild pain (pain score 1-3), fever or headache. What changed:  when to take this reasons to take this   amLODipine  10 MG tablet Commonly known as: NORVASC  Take 0.5 tablets (5 mg total) by mouth daily. What changed: how much to take   aspirin  EC 81 MG tablet Take 81 mg by mouth daily. Swallow whole.   cyanocobalamin  1000 MCG tablet Commonly known as: VITAMIN B12 Take 1,000 mcg by mouth daily.   ferric citrate  1 GM 210 MG(Fe) tablet Commonly known as: AURYXIA  Take 630 mg by mouth 3 (three) times daily with meals.   hydrALAZINE  25 MG tablet Commonly known as: APRESOLINE  Take 1 tablet (25 mg total) by mouth 3 (three) times daily. Take 50 mg 3 times daily on M, W, F(dialysis days) Take 50 mg twice daily on Tu, Th, Sat, Sun(non-dialysis days) What changed:  how much to take when to take this   insulin  aspart 100 UNIT/ML injection Commonly known as: novoLOG  Inject 0-6 Units into the skin 3 (three) times daily with meals. CBG 70 - 120: 0 units CBG 121 -  150: 0 units CBG 151 - 200: 1 unit CBG 201-250: 2 units CBG 251-300: 3 units CBG 301-350: 4 units CBG 351-400: 5 units CBG > 400: Give 6 units and call MD   isosorbide  mononitrate 30 MG 24 hr tablet Commonly known as: IMDUR  Take 1 tablet (30 mg total) by mouth 4 (four) times a week. Mon, Wed, Fri, Sun (non-dialysis days) What changed:  when to take this additional instructions   methocarbamol  500 MG tablet Commonly known as: ROBAXIN  Take 1 tablet (500 mg total) by mouth every 8 (eight) hours as needed for muscle spasms.   metoprolol  succinate 50  MG 24 hr tablet Commonly known as: TOPROL -XL Take 50 mg by mouth daily.   MIRCERA IJ Inject 1 Dose into the skin as directed.   nitroGLYCERIN  0.4 MG SL tablet Commonly known as: NITROSTAT  Place 0.4 mg under the tongue every 5 (five) minutes as needed for chest pain.   NovoLOG  70/30 FlexPen (70-30) 100 UNIT/ML FlexPen Generic drug: insulin  aspart protamine  - aspart Inject 20 Units into the skin at bedtime.   ondansetron  4 MG disintegrating tablet Commonly known as: ZOFRAN -ODT Take 1 tablet by mouth.   oxyCODONE -acetaminophen  5-325 MG tablet Commonly known as: PERCOCET/ROXICET Take 1 tablet by mouth every 8 (eight) hours as needed for severe pain (pain score 7-10).   rosuvastatin  40 MG tablet Commonly known as: CRESTOR  Take 40 mg by mouth daily.        Follow-up Information     Justin Senior, MD. Call in 1 week(s).   Specialty: Endocrinology Contact information: [address] [place] [address] [phone]                Discharge Exam: Justin [name]   07/17/23 1830 07/17/23 1846 07/18/23 0500  Weight: 76.2 kg 76.2 kg 76.5 kg   Constitutional: NAD, calm, comfortable Eyes: PERRL, lids and conjunctivae normal ENMT: Mucous membranes are moist. Posterior pharynx clear of any exudate or lesions.Normal dentition.  Neck: normal, supple, no masses, no thyromegaly Respiratory: clear to auscultation bilaterally, no wheezing, no crackles. Normal respiratory effort. No accessory muscle use.  Cardiovascular: Regular rate and rhythm, no murmurs / rubs / gallops. No extremity edema. 2+ pedal pulses. No carotid bruits.  Abdomen: no tenderness, no masses palpated. No hepatosplenomegaly. Bowel sounds positive.  Musculoskeletal: no clubbing / cyanosis. No joint deformity upper and lower extremities. Good ROM, no contractures. Normal muscle tone. LUE fistula in place Skin: no rashes, lesions, ulcers. No induration Neurologic: CN 2-12 grossly intact. Sensation intact,  DTR normal. Strength 5/5 x all 4 extremities.  Psychiatric: Normal judgment and insight. Alert and oriented x 3. Normal mood.    Condition at discharge: good  The results of significant diagnostics from this hospitalization (including imaging, microbiology, ancillary and laboratory) are listed below for reference.   Imaging Studies: CT RENAL STONE STUDY Result Date: 07/17/2023 CLINICAL DATA:  Flank pain. EXAM: CT ABDOMEN AND PELVIS WITHOUT CONTRAST TECHNIQUE: Multidetector CT imaging of the abdomen and pelvis was performed following the standard protocol without IV contrast. RADIATION DOSE REDUCTION: This exam was performed according to the departmental dose-optimization program which includes automated exposure control, adjustment of the mA and/or kV according to patient size and/or use of iterative reconstruction technique. COMPARISON:  August 02, 2022. FINDINGS: Lower chest: Small pericardial effusion is noted. Hepatobiliary: Minimal cholelithiasis. No biliary dilatation is noted. Liver is unremarkable on these unenhanced images. Pancreas: Grossly stable 14 mm cystic lesion seen in pancreatic tail. No acute inflammation is noted.  Spleen: Normal in size without focal abnormality. Adrenals/Urinary Tract: Adrenal glands appear normal. Bilateral renal atrophy is noted. No hydronephrosis or renal obstruction is noted. Urinary bladder is unremarkable. Stomach/Bowel: Stomach is unremarkable. The appendix appears normal. There is no evidence of bowel obstruction. Mild wall thickening with minimal surrounding inflammatory changes is seen involving the cecum. Vascular/Lymphatic: Aortic atherosclerosis. No enlarged abdominal or pelvic lymph nodes. Reproductive: Prostate is unremarkable. Other: No ascites or hernia is noted. Musculoskeletal: No acute or significant osseous findings. IMPRESSION: Small pericardial effusion. Minimal cholelithiasis. Grossly stable 14 mm cystic lesion seen in pancreatic tail. Recommend  follow up pre and post contrast MRI/MRCP or pancreatic protocol CT in 2 years. This recommendation follows ACR consensus guidelines: Management of Incidental Pancreatic Cysts: A White Paper of the ACR Incidental Findings Committee. J Am Coll Radiol 2017;14:911-923. Mild wall thickening with minimal surrounding inflammatory changes is seen involving the cecum suggesting infectious or inflammatory colitis. Bilateral renal atrophy is noted. Aortic Atherosclerosis (ICD10-I70.0). Electronically Signed   By: Lynwood Landy Raddle M.D.   On: 07/17/2023 12:27   PERIPHERAL VASCULAR CATHETERIZATION Result Date: 07/16/2023 Images from the original result were not included. Patient name: TAEVYN HAUSEN MRN: [id number] DOB: 1950-12-26 Sex: male 07/16/2023 Pre-operative Diagnosis: Bleeding from left arm fistula Post-operative diagnosis:  Same Surgeon:  Malvina New Procedure Performed:  1.  Ultrasound-guided access, left cephalic vein  2.  Fistulogram  3.  Peripheral balloon venoplasty  4.  Conscious sedation, 12 minutes Indications: This is a 73 year old gentleman who was brought into the hospital for hemorrhagic shock likely secondary to bleeding from his dialysis cannulation sites.  He comes in today for further evaluation Procedure:  The patient was identified in the holding area and taken to [name] 8.  The patient was then placed supine on the table and prepped and draped in the usual sterile fashion.  A time out was called.  Conscious sedation was administered with the use of IV fentanyl  and Versed  under continuous physician and nurse monitoring.  Heart rate, blood pressure, and oxygen  saturations were continuously monitored.  Total sedation time was 12 minutes ultrasound was used to evaluate the fistula.  The vein was patent and compressible.  A digital ultrasound image was acquired.  The fistula was then accessed under ultrasound guidance using a micropuncture needle.  An 018 wire was then asvanced without resistance and a  micropuncture sheath was placed.  Contrast injections were then performed through the sheath. Findings: No evidence of central venous stenosis.  The arteriovenous anastomosis is widely patent.  Numerous overlapping stents are visualized in the distal fistula going through the cephalic vein arch.  At the proximal and distal stent edges, there was approximately a 50% stenosis.  This was not clinically visualized on the proximal stent however it was very difficult to get a wire to pass.  Intervention: After the above images were acquired the decision was made to proceed with intervention.  A 6 French sheath was placed over a Bentson wire.  I had some difficulty getting the Bentson wire through the proximal portion of the stents but was ultimately able to do so.  I then selected a 9 x 40 balloon and performed balloon venoplasty of the cephalic vein at the proximal and distal stent edges.  Completion imaging revealed no residual stenosis.  There is a small size mismatch between the stents and the vein.  Balloon and wire were removed.  A 4-0 Monocryl was placed around the sheath which was removed.  There  were no immediate complications Impression:  #1  Successful balloon venoplasty of the proximal and distal stent edge stenosis using a 9 mm balloon  #2  Fistula remains amenable to future interventions V. Malvina New, M.D., FACS [facility] Office: [phone] Pager:  [phone]   ECHOCARDIOGRAM COMPLETE Result Date: 07/12/2023    ECHOCARDIOGRAM REPORT   Patient Name:   Justin [name] Date of Exam: 07/11/2023 Medical Rec #:  [id number]     Height:       68.0 in Accession #:    [id number]    Weight:       173.8 lb Date of Birth:  [birth year]      BSA:          1.925 m Patient Age:    73 years      BP:           140/58 mmHg Patient Gender: M             HR:           62 bpm. Exam Location:  [facility] Procedure: 2D Echo, Cardiac Doppler, Color Doppler and Strain Analysis (Both             Spectral and Color Flow Doppler were utilized during procedure). Indications:    Dyspnea on exertion [R06.09 (ICD-10-CM)]  History:        Patient has prior history of Echocardiogram examinations, most                 recent 08/30/2022. CHF, CAD, Aortic Valve Disease; Risk                 Factors:Diabetes, Dyslipidemia and Hypertension. End-stage                 kidney disease on dialysis.  Sonographer:    Charlie Jointer RDCS Referring Phys: [id number] [name] [name] IMPRESSIONS  1. Left ventricular ejection fraction, by estimation, is 60 to 65%. The left ventricle has normal function. The left ventricle has no regional wall motion abnormalities. There is severe left ventricular hypertrophy. Left ventricular diastolic parameters  are consistent with Grade II diastolic dysfunction (pseudonormalization).  2. Right ventricular systolic function is normal. The right ventricular size is mildly enlarged.  3. Left atrial size was severely dilated.  4. A small pericardial effusion is present. The pericardial effusion is circumferential. There is no evidence of cardiac tamponade.  5. The mitral valve is degenerative. Mild mitral valve regurgitation. No evidence of mitral stenosis.  6. The aortic valve is tricuspid. There is moderate calcification of the aortic valve. Aortic valve regurgitation is mild. Moderate to severe aortic valve stenosis. Aortic valve area, by VTI measures 0.79 cm. Aortic valve mean gradient measures 23.8 mmHg. Aortic valve Vmax measures 3.21 m/s.  7. The inferior vena cava is normal in size with greater than 50% respiratory variability, suggesting right atrial pressure of 3 mmHg. Comparison(s): In comparison prior TTE 8/8/82024 LVEF 60-65%, grade 1DD, trace MR, AS-paradoxical low gradient severe, small-moderate pericardial effusion. FINDINGS  Left Ventricle: Left ventricular ejection fraction, by estimation, is 60 to 65%. The left ventricle has normal function. The left ventricle has no regional wall  motion abnormalities. Global longitudinal strain performed but not reported based on interpreter judgement due to suboptimal tracking. The left ventricular internal cavity size was normal in size. There is severe left ventricular hypertrophy. Left ventricular diastolic parameters are consistent with Grade II diastolic dysfunction (pseudonormalization). Right Ventricle: The right ventricular size  is mildly enlarged. Right vetricular wall thickness was not well visualized. Right ventricular systolic function is normal. Left Atrium: Left atrial size was severely dilated. Right Atrium: Right atrial size was normal in size. Pericardium: A small pericardial effusion is present. The pericardial effusion is circumferential. There is no evidence of cardiac tamponade. Mitral Valve: The mitral valve is degenerative in appearance. Mild mitral annular calcification. Mild mitral valve regurgitation. No evidence of mitral valve stenosis. Tricuspid Valve: The tricuspid valve is not well visualized. Tricuspid valve regurgitation is mild . No evidence of tricuspid stenosis. Aortic Valve: The aortic valve is tricuspid. There is moderate calcification of the aortic valve. Aortic valve regurgitation is mild. Moderate to severe aortic stenosis is present. Aortic valve mean gradient measures 23.8 mmHg. Aortic valve peak gradient  measures 41.3 mmHg. Aortic valve area, by VTI measures 0.79 cm. Pulmonic Valve: The pulmonic valve was grossly normal. Pulmonic valve regurgitation is trivial. No evidence of pulmonic stenosis. Aorta: The aortic root and ascending aorta are structurally normal, with no evidence of dilitation. Venous: The inferior vena cava is normal in size with greater than 50% respiratory variability, suggesting right atrial pressure of 3 mmHg. IAS/Shunts: The interatrial septum was not well visualized.  LEFT VENTRICLE PLAX 2D LVIDd:         4.70 cm   Diastology LVIDs:         3.10 cm   LV e' medial:    4.68 cm/s LV PW:          1.60 cm   LV E/e' medial:  15.8 LV IVS:        1.70 cm   LV e' lateral:   6.60 cm/s LVOT diam:     2.10 cm   LV E/e' lateral: 11.2 LV SV:         63 LV SV Index:   33 LVOT Area:     3.46 cm  RIGHT VENTRICLE             IVC RV Basal diam:  4.10 cm     IVC diam: 2.00 cm RV Mid diam:    3.30 cm RV S prime:     12.43 cm/s TAPSE (M-mode): 2.3 cm LEFT ATRIUM            Index        RIGHT ATRIUM           Index LA diam:      4.50 cm  2.34 cm/m   RA Area:     14.80 cm LA Vol (A2C): 106.0 ml 55.06 ml/m  RA Volume:   37.50 ml  19.48 ml/m LA Vol (A4C): 102.0 ml 52.98 ml/m  AORTIC VALVE AV Area (Vmax):    0.86 cm AV Area (Vmean):   0.77 cm AV Area (VTI):     0.79 cm AV Vmax:           321.40 cm/s AV Vmean:          229.200 cm/s AV VTI:            0.792 m AV Peak Grad:      41.3 mmHg AV Mean Grad:      23.8 mmHg LVOT Vmax:         79.93 cm/s LVOT Vmean:        50.800 cm/s LVOT VTI:          0.182 m LVOT/AV VTI ratio: 0.23  AORTA Ao Root diam: 3.70 cm Ao Asc diam:  3.60  cm Ao Desc diam: 2.10 cm MITRAL VALVE MV Area (PHT): 3.78 cm    SHUNTS MV Decel Time: 201 msec    Systemic VTI:  0.18 m MR Peak grad: 48.2 mmHg    Systemic Diam: 2.10 cm MR Vmax:      347.00 cm/s MV E velocity: 73.90 cm/s MV A velocity: 73.55 cm/s MV E/A ratio:  1.00 Sreedhar reddy Madireddy Electronically signed by Alean reddy Madireddy Signature Date/Time: 07/12/2023/8:26:22 PM    Final     Microbiology: Results for orders placed or performed during the hospital encounter of 07/13/23  MRSA Next Gen by PCR, Nasal     Status: None   Collection Time: 07/13/23  6:57 PM   Specimen: Nasal Mucosa; Nasal Swab  Result Value Ref Range Status   MRSA by PCR Next Gen NOT DETECTED NOT DETECTED Final    Comment: (NOTE) The GeneXpert MRSA Assay (FDA approved for NASAL specimens only), is one component of a comprehensive MRSA colonization surveillance program. It is not intended to diagnose MRSA infection nor to guide or monitor treatment for MRSA  infections. Test performance is not FDA approved in patients less than 3 years old. Performed at [facility], [address], [place], [place] [address]   Culture, blood (Routine X 2) w Reflex to ID Panel     Status: None   Collection Time: 07/13/23 11:08 PM   Specimen: BLOOD RIGHT ARM  Result Value Ref Range Status   Specimen Description BLOOD RIGHT ARM  Final   Special Requests   Final    BOTTLES DRAWN AEROBIC AND ANAEROBIC Blood Culture results may not be optimal due to an inadequate volume of blood received in culture bottles   Culture   Final    NO GROWTH 5 DAYS Performed at [facility], [address] [place], [place] [address]    Report Status 07/18/2023 FINAL  Final  Culture, blood (Routine X 2) w Reflex to ID Panel     Status: None   Collection Time: 07/13/23 11:08 PM   Specimen: BLOOD RIGHT HAND  Result Value Ref Range Status   Specimen Description BLOOD RIGHT HAND  Final   Special Requests   Final    BOTTLES DRAWN AEROBIC ONLY Blood Culture adequate volume   Culture   Final    NO GROWTH 5 DAYS Performed at [facility], [address] [place], [place] [address]    Report Status 07/18/2023 FINAL  Final    Labs: CBC: Recent Labs  Lab 07/14/23 0259 07/15/23 0322 07/16/23 1032 07/17/23 0420 07/18/23 0846  WBC 14.8* 7.8 10.3 10.9* 9.3  NEUTROABS  --   --   --  9.0*  --   HGB 7.9* 5.6* 8.8* 9.3* 9.8*  HCT 23.1* 16.8* 25.7* 28.8* 29.5*  MCV 85.6 86.2 85.7 89.7 87.0  PLT 206 176 204 235 264   Basic Metabolic Panel: Recent Labs  Lab 07/13/23 2308 07/14/23 0259 07/15/23 0322 07/17/23 0420 07/18/23 0846  NA 128* 134* 132* 132* 131*  K 4.4 3.8 3.9 4.4 3.6  CL 94* 92* 90* 91* 90*  CO2 15* 30 25 22 28   GLUCOSE 320* 274* 132* 121* 173*  BUN 58* 58* 66* 40* 23  CREATININE 10.58* 10.38* 11.76* 8.54* 6.33*  CALCIUM  7.3* 7.5* 6.9* 8.3* 8.5*  MG 2.0 2.1 1.9  --   --   PHOS 10.3* 7.3* 7.3*  --  4.9*   Liver Function Tests: Recent Labs  Lab 07/13/23 2308 07/14/23 0259 07/18/23 0846  AST 122* 145*  --   ALT 121* 135*  --   ALKPHOS 69 61  --   BILITOT 0.7 0.6  --   PROT 5.2* 4.9*  --   ALBUMIN  2.4* 2.2* 2.7*   CBG: Recent Labs  Lab 07/17/23 0749 07/17/23 1154 07/17/23 2152 07/18/23 0733 07/18/23 1200  GLUCAP 229* 177* 126* 182* 126*    Discharge time spent: 45 minutes.  Signed: Deliliah Room, MD [facility] 07/18/2023

## 2023-07-19 ENCOUNTER — Telehealth: Payer: Self-pay | Admitting: Internal Medicine

## 2023-07-19 ENCOUNTER — Inpatient Hospital Stay (HOSPITAL_COMMUNITY)
Admission: EM | Admit: 2023-07-19 | Discharge: 2023-07-25 | DRG: 314 | Disposition: A | Discharge status: Home / Self Care | Attending: Internal Medicine | Admitting: Internal Medicine

## 2023-07-19 ENCOUNTER — Emergency Department (HOSPITAL_COMMUNITY)

## 2023-07-19 ENCOUNTER — Inpatient Hospital Stay (HOSPITAL_COMMUNITY)

## 2023-07-19 ENCOUNTER — Other Ambulatory Visit: Payer: Self-pay

## 2023-07-19 ENCOUNTER — Telehealth (HOSPITAL_COMMUNITY): Payer: Self-pay | Admitting: Pharmacist

## 2023-07-19 DIAGNOSIS — I5022 Chronic systolic (congestive) heart failure: Secondary | ICD-10-CM | POA: Diagnosis not present

## 2023-07-19 DIAGNOSIS — Z833 Family history of diabetes mellitus: Secondary | ICD-10-CM

## 2023-07-19 DIAGNOSIS — I340 Nonrheumatic mitral (valve) insufficiency: Secondary | ICD-10-CM | POA: Diagnosis not present

## 2023-07-19 DIAGNOSIS — D631 Anemia in chronic kidney disease: Secondary | ICD-10-CM | POA: Diagnosis present

## 2023-07-19 DIAGNOSIS — Z8616 Personal history of COVID-19: Secondary | ICD-10-CM

## 2023-07-19 DIAGNOSIS — G061 Intraspinal abscess and granuloma: Secondary | ICD-10-CM | POA: Diagnosis not present

## 2023-07-19 DIAGNOSIS — I361 Nonrheumatic tricuspid (valve) insufficiency: Secondary | ICD-10-CM | POA: Diagnosis not present

## 2023-07-19 DIAGNOSIS — B9561 Methicillin susceptible Staphylococcus aureus infection as the cause of diseases classified elsewhere: Secondary | ICD-10-CM | POA: Diagnosis present

## 2023-07-19 DIAGNOSIS — K2100 Gastro-esophageal reflux disease with esophagitis, without bleeding: Secondary | ICD-10-CM

## 2023-07-19 DIAGNOSIS — D75839 Thrombocytosis, unspecified: Secondary | ICD-10-CM | POA: Diagnosis present

## 2023-07-19 DIAGNOSIS — E785 Hyperlipidemia, unspecified: Secondary | ICD-10-CM | POA: Diagnosis present

## 2023-07-19 DIAGNOSIS — N186 End stage renal disease: Secondary | ICD-10-CM | POA: Diagnosis present

## 2023-07-19 DIAGNOSIS — N400 Enlarged prostate without lower urinary tract symptoms: Secondary | ICD-10-CM | POA: Diagnosis present

## 2023-07-19 DIAGNOSIS — K2970 Gastritis, unspecified, without bleeding: Secondary | ICD-10-CM | POA: Diagnosis present

## 2023-07-19 DIAGNOSIS — E871 Hypo-osmolality and hyponatremia: Secondary | ICD-10-CM | POA: Diagnosis present

## 2023-07-19 DIAGNOSIS — Z794 Long term (current) use of insulin: Secondary | ICD-10-CM

## 2023-07-19 DIAGNOSIS — Z823 Family history of stroke: Secondary | ICD-10-CM

## 2023-07-19 DIAGNOSIS — L02414 Cutaneous abscess of left upper limb: Secondary | ICD-10-CM | POA: Diagnosis not present

## 2023-07-19 DIAGNOSIS — R09A2 Foreign body sensation, throat: Secondary | ICD-10-CM | POA: Diagnosis present

## 2023-07-19 DIAGNOSIS — E8809 Other disorders of plasma-protein metabolism, not elsewhere classified: Secondary | ICD-10-CM | POA: Diagnosis not present

## 2023-07-19 DIAGNOSIS — R7881 Bacteremia: Principal | ICD-10-CM | POA: Diagnosis present

## 2023-07-19 DIAGNOSIS — C7951 Secondary malignant neoplasm of bone: Secondary | ICD-10-CM | POA: Diagnosis present

## 2023-07-19 DIAGNOSIS — Y841 Kidney dialysis as the cause of abnormal reaction of the patient, or of later complication, without mention of misadventure at the time of the procedure: Secondary | ICD-10-CM | POA: Diagnosis not present

## 2023-07-19 DIAGNOSIS — R0989 Other specified symptoms and signs involving the circulatory and respiratory systems: Secondary | ICD-10-CM | POA: Diagnosis present

## 2023-07-19 DIAGNOSIS — I10 Essential (primary) hypertension: Secondary | ICD-10-CM | POA: Diagnosis not present

## 2023-07-19 DIAGNOSIS — D62 Acute posthemorrhagic anemia: Secondary | ICD-10-CM | POA: Diagnosis not present

## 2023-07-19 DIAGNOSIS — L02212 Cutaneous abscess of back [any part, except buttock]: Secondary | ICD-10-CM | POA: Diagnosis present

## 2023-07-19 DIAGNOSIS — I2510 Atherosclerotic heart disease of native coronary artery without angina pectoris: Secondary | ICD-10-CM | POA: Diagnosis present

## 2023-07-19 DIAGNOSIS — T82838A Hemorrhage of vascular prosthetic devices, implants and grafts, initial encounter: Secondary | ICD-10-CM | POA: Diagnosis not present

## 2023-07-19 DIAGNOSIS — I132 Hypertensive heart and chronic kidney disease with heart failure and with stage 5 chronic kidney disease, or end stage renal disease: Secondary | ICD-10-CM | POA: Diagnosis not present

## 2023-07-19 DIAGNOSIS — I5032 Chronic diastolic (congestive) heart failure: Secondary | ICD-10-CM | POA: Diagnosis present

## 2023-07-19 DIAGNOSIS — E1122 Type 2 diabetes mellitus with diabetic chronic kidney disease: Secondary | ICD-10-CM | POA: Diagnosis not present

## 2023-07-19 DIAGNOSIS — I080 Rheumatic disorders of both mitral and aortic valves: Secondary | ICD-10-CM | POA: Diagnosis not present

## 2023-07-19 DIAGNOSIS — Z992 Dependence on renal dialysis: Secondary | ICD-10-CM | POA: Diagnosis not present

## 2023-07-19 DIAGNOSIS — Z7982 Long term (current) use of aspirin: Secondary | ICD-10-CM

## 2023-07-19 DIAGNOSIS — M48061 Spinal stenosis, lumbar region without neurogenic claudication: Secondary | ICD-10-CM | POA: Diagnosis not present

## 2023-07-19 DIAGNOSIS — E1121 Type 2 diabetes mellitus with diabetic nephropathy: Secondary | ICD-10-CM | POA: Diagnosis not present

## 2023-07-19 DIAGNOSIS — E1165 Type 2 diabetes mellitus with hyperglycemia: Secondary | ICD-10-CM | POA: Diagnosis present

## 2023-07-19 DIAGNOSIS — I350 Nonrheumatic aortic (valve) stenosis: Secondary | ICD-10-CM | POA: Diagnosis not present

## 2023-07-19 DIAGNOSIS — K209 Esophagitis, unspecified without bleeding: Secondary | ICD-10-CM | POA: Diagnosis not present

## 2023-07-19 DIAGNOSIS — D509 Iron deficiency anemia, unspecified: Secondary | ICD-10-CM | POA: Diagnosis present

## 2023-07-19 DIAGNOSIS — K219 Gastro-esophageal reflux disease without esophagitis: Secondary | ICD-10-CM | POA: Diagnosis present

## 2023-07-19 DIAGNOSIS — G062 Extradural and subdural abscess, unspecified: Secondary | ICD-10-CM | POA: Diagnosis not present

## 2023-07-19 DIAGNOSIS — Z79899 Other long term (current) drug therapy: Secondary | ICD-10-CM

## 2023-07-19 DIAGNOSIS — B9562 Methicillin resistant Staphylococcus aureus infection as the cause of diseases classified elsewhere: Secondary | ICD-10-CM | POA: Diagnosis present

## 2023-07-19 DIAGNOSIS — T827XXA Infection and inflammatory reaction due to other cardiac and vascular devices, implants and grafts, initial encounter: Secondary | ICD-10-CM | POA: Diagnosis not present

## 2023-07-19 DIAGNOSIS — I330 Acute and subacute infective endocarditis: Secondary | ICD-10-CM | POA: Diagnosis not present

## 2023-07-19 DIAGNOSIS — I083 Combined rheumatic disorders of mitral, aortic and tricuspid valves: Secondary | ICD-10-CM | POA: Diagnosis not present

## 2023-07-19 DIAGNOSIS — I517 Cardiomegaly: Secondary | ICD-10-CM | POA: Diagnosis not present

## 2023-07-19 DIAGNOSIS — N2581 Secondary hyperparathyroidism of renal origin: Secondary | ICD-10-CM | POA: Diagnosis present

## 2023-07-19 DIAGNOSIS — I700 Atherosclerosis of aorta: Secondary | ICD-10-CM | POA: Diagnosis not present

## 2023-07-19 DIAGNOSIS — I120 Hypertensive chronic kidney disease with stage 5 chronic kidney disease or end stage renal disease: Secondary | ICD-10-CM | POA: Diagnosis not present

## 2023-07-19 DIAGNOSIS — M5106 Intervertebral disc disorders with myelopathy, lumbar region: Secondary | ICD-10-CM | POA: Diagnosis not present

## 2023-07-19 DIAGNOSIS — K2980 Duodenitis without bleeding: Secondary | ICD-10-CM | POA: Diagnosis present

## 2023-07-19 DIAGNOSIS — R001 Bradycardia, unspecified: Secondary | ICD-10-CM | POA: Diagnosis not present

## 2023-07-19 DIAGNOSIS — K3189 Other diseases of stomach and duodenum: Secondary | ICD-10-CM | POA: Diagnosis present

## 2023-07-19 DIAGNOSIS — K449 Diaphragmatic hernia without obstruction or gangrene: Secondary | ICD-10-CM | POA: Diagnosis not present

## 2023-07-19 DIAGNOSIS — K5730 Diverticulosis of large intestine without perforation or abscess without bleeding: Secondary | ICD-10-CM | POA: Diagnosis present

## 2023-07-19 DIAGNOSIS — M545 Low back pain, unspecified: Secondary | ICD-10-CM | POA: Diagnosis not present

## 2023-07-19 DIAGNOSIS — M6008 Infective myositis, other site: Secondary | ICD-10-CM | POA: Diagnosis not present

## 2023-07-19 HISTORY — DX: Methicillin susceptible Staphylococcus aureus infection as the cause of diseases classified elsewhere: B95.61

## 2023-07-19 LAB — CBC WITH DIFFERENTIAL/PLATELET
Abs Immature Granulocytes: 0.15 K/uL — ABNORMAL HIGH (ref 0.00–0.07)
Basophils Absolute: 0.1 K/uL (ref 0.0–0.1)
Basophils Relative: 0 %
Eosinophils Absolute: 0.1 K/uL (ref 0.0–0.5)
Eosinophils Relative: 1 %
HCT: 26.8 % — ABNORMAL LOW (ref 39.0–52.0)
Hemoglobin: 9 g/dL — ABNORMAL LOW (ref 13.0–17.0)
Immature Granulocytes: 1 %
Lymphocytes Relative: 6 %
Lymphs Abs: 0.8 K/uL (ref 0.7–4.0)
MCH: 29.6 pg (ref 26.0–34.0)
MCHC: 33.6 g/dL (ref 30.0–36.0)
MCV: 88.2 fL (ref 80.0–100.0)
Monocytes Absolute: 1 K/uL (ref 0.1–1.0)
Monocytes Relative: 8 %
Neutro Abs: 10.3 K/uL — ABNORMAL HIGH (ref 1.7–7.7)
Neutrophils Relative %: 84 %
Platelets: 298 K/uL (ref 150–400)
RBC: 3.04 MIL/uL — ABNORMAL LOW (ref 4.22–5.81)
RDW: 14.6 % (ref 11.5–15.5)
WBC: 12.4 K/uL — ABNORMAL HIGH (ref 4.0–10.5)
nRBC: 0 % (ref 0.0–0.2)

## 2023-07-19 LAB — COMPREHENSIVE METABOLIC PANEL WITH GFR
ALT: 81 U/L — ABNORMAL HIGH (ref 0–44)
AST: 49 U/L — ABNORMAL HIGH (ref 15–41)
Albumin: 2.7 g/dL — ABNORMAL LOW (ref 3.5–5.0)
Alkaline Phosphatase: 88 U/L (ref 38–126)
Anion gap: 17 — ABNORMAL HIGH (ref 5–15)
BUN: 34 mg/dL — ABNORMAL HIGH (ref 8–23)
CO2: 23 mmol/L (ref 22–32)
Calcium: 8.1 mg/dL — ABNORMAL LOW (ref 8.9–10.3)
Chloride: 89 mmol/L — ABNORMAL LOW (ref 98–111)
Creatinine, Ser: 8.49 mg/dL — ABNORMAL HIGH (ref 0.61–1.24)
GFR, Estimated: 6 mL/min — ABNORMAL LOW (ref >60)
Glucose, Bld: 153 mg/dL — ABNORMAL HIGH (ref 70–99)
Potassium: 4.1 mmol/L (ref 3.5–5.1)
Sodium: 129 mmol/L — ABNORMAL LOW (ref 135–145)
Total Bilirubin: 0.7 mg/dL (ref 0.0–1.2)
Total Protein: 6.6 g/dL (ref 6.5–8.1)

## 2023-07-19 LAB — HEMOGLOBIN A1C
Hgb A1c MFr Bld: 5.3 % (ref 4.8–5.6)
Mean Plasma Glucose: 105.41 mg/dL

## 2023-07-19 LAB — PROTIME-INR
INR: 1.3 — ABNORMAL HIGH (ref 0.8–1.2)
Prothrombin Time: 16.5 s — ABNORMAL HIGH (ref 11.4–15.2)

## 2023-07-19 LAB — CBG MONITORING, ED
Glucose-Capillary: 130 mg/dL — ABNORMAL HIGH (ref 70–99)
Glucose-Capillary: 122 mg/dL — ABNORMAL HIGH (ref 70–99)

## 2023-07-19 LAB — I-STAT CG4 LACTIC ACID, ED
Lactic Acid, Venous: 0.9 mmol/L (ref 0.5–1.9)
Lactic Acid, Venous: 0.8 mmol/L (ref 0.5–1.9)

## 2023-07-19 MED ORDER — OXYCODONE HCL 5 MG PO TABS
5.0000 mg | ORAL_TABLET | ORAL | Status: DC | PRN
  Administered 2023-07-20 – 2023-07-25 (×17): 5 mg via ORAL
  Filled 2023-07-19 (×17): qty 1

## 2023-07-19 MED ORDER — HEPARIN SODIUM (PORCINE) 5000 UNIT/ML IJ SOLN
5000.0000 [IU] | Freq: Three times a day (TID) | INTRAMUSCULAR | Status: DC
  Administered 2023-07-19 – 2023-07-25 (×15): 5000 [IU] via SUBCUTANEOUS
  Filled 2023-07-19 (×15): qty 1

## 2023-07-19 MED ORDER — METOPROLOL SUCCINATE ER 50 MG PO TB24
50.0000 mg | ORAL_TABLET | Freq: Every day | ORAL | Status: DC
Start: 2023-07-19 — End: 2023-07-25
  Administered 2023-07-19 – 2023-07-25 (×6): 50 mg via ORAL
  Filled 2023-07-19 (×3): qty 1
  Filled 2023-07-19: qty 2
  Filled 2023-07-19 (×3): qty 1

## 2023-07-19 MED ORDER — INSULIN GLARGINE-YFGN 100 UNIT/ML ~~LOC~~ SOLN
20.0000 [IU] | Freq: Every day | SUBCUTANEOUS | Status: DC
  Administered 2023-07-19 – 2023-07-24 (×6): 20 [IU] via SUBCUTANEOUS
  Filled 2023-07-19 (×8): qty 0.2

## 2023-07-19 MED ORDER — MORPHINE SULFATE (PF) 2 MG/ML IV SOLN: 4.0000 mg | Freq: Once | INTRAVENOUS | Status: DC

## 2023-07-19 MED ORDER — GADOBUTROL 1 MMOL/ML IV SOLN
7.0000 mL | Freq: Once | INTRAVENOUS | Status: AC | PRN
  Administered 2023-07-19: 7 mL via INTRAVENOUS

## 2023-07-19 MED ORDER — HYDROCODONE-ACETAMINOPHEN 5-325 MG PO TABS
1.0000 | ORAL_TABLET | Freq: Once | ORAL | Status: AC
  Administered 2023-07-19: 1 via ORAL
  Filled 2023-07-19: qty 1

## 2023-07-19 MED ORDER — ASPIRIN 81 MG PO TBEC
81.0000 mg | DELAYED_RELEASE_TABLET | Freq: Every day | ORAL | Status: DC
  Administered 2023-07-19 – 2023-07-25 (×6): 81 mg via ORAL
  Filled 2023-07-19 (×7): qty 1

## 2023-07-19 MED ORDER — AMLODIPINE BESYLATE 5 MG PO TABS
5.0000 mg | ORAL_TABLET | Freq: Every day | ORAL | Status: DC
  Administered 2023-07-19 – 2023-07-23 (×4): 5 mg via ORAL
  Filled 2023-07-19 (×5): qty 1

## 2023-07-19 MED ORDER — ISOSORBIDE MONONITRATE ER 30 MG PO TB24
30.0000 mg | ORAL_TABLET | ORAL | Status: DC
  Administered 2023-07-19 – 2023-07-24 (×4): 30 mg via ORAL
  Filled 2023-07-19 (×4): qty 1

## 2023-07-19 MED ORDER — METHOCARBAMOL 500 MG PO TABS
500.0000 mg | ORAL_TABLET | Freq: Three times a day (TID) | ORAL | Status: DC
  Administered 2023-07-19 – 2023-07-25 (×17): 500 mg via ORAL
  Filled 2023-07-19 (×19): qty 1

## 2023-07-19 MED ORDER — ACETAMINOPHEN 500 MG PO TABS
1000.0000 mg | ORAL_TABLET | Freq: Three times a day (TID) | ORAL | Status: DC
  Administered 2023-07-19 – 2023-07-25 (×16): 1000 mg via ORAL
  Filled 2023-07-19 (×17): qty 2

## 2023-07-19 MED ORDER — CEFAZOLIN SODIUM-DEXTROSE 1-4 GM/50ML-% IV SOLN
1.0000 g | INTRAVENOUS | Status: DC
  Administered 2023-07-20 – 2023-07-24 (×5): 1 g via INTRAVENOUS
  Filled 2023-07-19 (×6): qty 50

## 2023-07-19 MED ORDER — CEFAZOLIN SODIUM-DEXTROSE 1-4 GM/50ML-% IV SOLN
1.0000 g | Freq: Once | INTRAVENOUS | Status: AC
  Administered 2023-07-19: 1 g via INTRAVENOUS
  Filled 2023-07-19: qty 50

## 2023-07-19 MED ORDER — ENOXAPARIN SODIUM 40 MG/0.4ML IJ SOSY: 40.0000 mg | PREFILLED_SYRINGE | INTRAMUSCULAR | Status: DC

## 2023-07-19 MED ORDER — ONDANSETRON HCL 4 MG/2ML IJ SOLN: 4.0000 mg | Freq: Once | INTRAMUSCULAR | Status: DC

## 2023-07-19 MED ORDER — ROSUVASTATIN CALCIUM 20 MG PO TABS
40.0000 mg | ORAL_TABLET | Freq: Every day | ORAL | Status: DC
Start: 2023-07-19 — End: 2023-07-25
  Administered 2023-07-19 – 2023-07-25 (×6): 40 mg via ORAL
  Filled 2023-07-19 (×7): qty 2

## 2023-07-19 MED ORDER — INSULIN ASPART 100 UNIT/ML IJ SOLN
0.0000 [IU] | Freq: Three times a day (TID) | INTRAMUSCULAR | Status: DC
  Administered 2023-07-19: 2 [IU] via SUBCUTANEOUS
  Administered 2023-07-21 – 2023-07-22 (×3): 3 [IU] via SUBCUTANEOUS
  Administered 2023-07-23: 2 [IU] via SUBCUTANEOUS
  Administered 2023-07-23: 3 [IU] via SUBCUTANEOUS
  Administered 2023-07-24: 2 [IU] via SUBCUTANEOUS

## 2023-07-19 NOTE — ED Provider Notes (Signed)
 Emergency Medicine Provider Triage Evaluation Note  Justin [name] , a 73 y.o. male  was evaluated in triage.  Pt complains of flank pain, bacteremia.  Patient recently discharged after an AV fistula bleed but received a call today given positive blood cultures growing MSSA.  Endorsing persistent flank pain but did receive a CT scan in the hospital that was negative for stone.  Review of Systems  Positive: Flank pain Negative: Fever, chest pain, shortness of breath, abdominal pain  Physical Exam  BP (!) 111/41 (BP Location: Right Arm)   Pulse 67   Temp 98.5 F (36.9 C)   Resp 18   SpO2 99%  Gen:   Awake, no distress   Resp:  Normal effort  MSK:   Moves extremities without difficulty  Other:    Medical Decision Making  Medically screening exam initiated at 10:55 AM.  Appropriate orders placed.  Justin [name] was informed that the remainder of the evaluation will be completed by another provider, this initial triage assessment does not replace that evaluation, and the importance of remaining in the ED until their evaluation is complete.     Albertina Dixon, MD 07/19/23 1056

## 2023-07-19 NOTE — ED Triage Notes (Signed)
 Patient was just discharged yesterday.  Family reports hospital called and said he had +blood cultures and needs to come back for IV antibiotics.

## 2023-07-19 NOTE — H&P (Addendum)
 History and Physical    Patient: Justin [name] FMW:[id number] DOB: Nov 29, 1950 DOA: 07/19/2023 DOS: the patient was seen and examined on 07/19/2023 PCP: Bernie Lamar PARAS, MD  Patient coming from: Home  Chief Complaint:  Chief Complaint  Patient presents with   Abnormal Lab   Back Pain   HPI: Justin [name] is a 73 y.o. male with medical history significant of ESRD (iHD on TTS), HFpEF, CAD, AS, and HTN, HLD, DM2, and recent admission  from 7/11-7/16 for hemorrhaghic shock 2/2 bleeding fistula during HD who underwent fistulogram and angioplasty per IR on 7/14 who now represents today with MSSA bacteremia from blood cultures drawn on 7/11(prior admission).   Pt states that he was in his USOH until being called back to the hospital for further evaluation. Pt denies any cp, f/c or n/v.  In the ED, pt hypertensive. Labs notable for Na 129, Cr 8.49, and WBC 12.4. CXR w/o PNA or edema. Pt admitted to medicine for ongoing care.  Review of Systems: As mentioned in the history of present illness. All other systems reviewed and are negative. Past Medical History:  Diagnosis Date   BPH (benign prostatic hyperplasia)    Diabetes (HCC)    Dysuria    Erectile dysfunction    ESRD on hemodialysis (HCC)    History of chronic CHF 11/19/2019   Hyperlipemia    Hypertension    Personal history of COVID-19 11/19/2019   Pinna disorder, left    Wears glasses    Past Surgical History:  Procedure Laterality Date   A/V FISTULAGRAM Left 07/16/2023   Procedure: A/V Fistulagram;  Surgeon: Serene Gaile LELON, MD;  Location: [facility];  Service: Cardiovascular;  Laterality: Left;   A/V SHUNT INTERVENTION N/A 04/16/2023   Procedure: A/V SHUNT INTERVENTION;  Surgeon: Norine Manuelita LABOR, MD;  Location: [facility];  Service: Cardiovascular;  Laterality: N/A;   AV FISTULA PLACEMENT Left 04/30/2018   Procedure: CREATION OF RADIOCEPHALIC ARTERIOVENOUS FISTULA LEFT ARM;  Surgeon: Harvey Carlin BRAVO, MD;   Location: [facility] OR;  Service: Vascular;  Laterality: Left;   AV FISTULA PLACEMENT Left 08/28/2018   Procedure: ARTERIOVENOUS (AV) BRACHIOCEPHALIC FISTULA CREATION LEFT UPPER ARM;  Surgeon: Gretta Lonni PARAS, MD;  Location: [facility] OR;  Service: Vascular;  Laterality: Left;   BIOPSY  02/24/2023   Procedure: BIOPSY;  Surgeon: Federico Rosario BROCKS, MD;  Location: [facility] ENDOSCOPY;  Service: Gastroenterology;;   ESOPHAGOGASTRODUODENOSCOPY (EGD) WITH PROPOFOL  N/A 02/24/2023   Procedure: ESOPHAGOGASTRODUODENOSCOPY (EGD) WITH PROPOFOL ;  Surgeon: Federico Rosario BROCKS, MD;  Location: [facility] ENDOSCOPY;  Service: Gastroenterology;  Laterality: N/A;  Patint is s/p right hip ORIF   INTRAMEDULLARY (IM) NAIL INTERTROCHANTERIC Right 02/17/2023   Procedure: INTRAMEDULLARY (IM) NAIL INTERTROCHANTERIC;  Surgeon: Sherida Adine BROCKS, MD;  Location: [facility] OR;  Service: Orthopedics;  Laterality: Right;   IR FLUORO GUIDE CV LINE RIGHT  04/29/2018   IR US  GUIDE VASC ACCESS RIGHT  04/29/2018   RIGHT/LEFT HEART CATH AND CORONARY ANGIOGRAPHY N/A 05/01/2018   Procedure: RIGHT/LEFT HEART CATH AND CORONARY ANGIOGRAPHY;  Surgeon: Claudene Victory LELON, MD;  Location: [facility];  Service: Cardiovascular;  Laterality: N/A;   RIGHT/LEFT HEART CATH AND CORONARY ANGIOGRAPHY N/A 09/29/2022   Procedure: RIGHT/LEFT HEART CATH AND CORONARY ANGIOGRAPHY;  Surgeon: Wonda Sharper, MD;  Location: [facility] INVASIVE CV LAB;  Service: Cardiovascular;  Laterality: N/A;   subdural hematoma Right 07/2020   Social History:  reports that he has never smoked. He has never used smokeless  tobacco. He reports that he does not currently use alcohol . He reports that he does not use drugs.  No Known Allergies  Family History  Problem Relation Age of Onset   Stroke Mother    Diabetes Father     Prior to Admission medications   Medication Sig Start Date End Date Taking? Authorizing Provider  acetaminophen  (TYLENOL ) 500 MG tablet Take 2 tablets (1,000 mg total) by mouth every 6 (six)  hours as needed for mild pain (pain score 1-3), fever or headache. 02/23/23   Laurence Locus, DO  amLODipine  (NORVASC ) 10 MG tablet Take 0.5 tablets (5 mg total) by mouth daily. 07/18/23   Rashid, Farhan, MD  aspirin  EC 81 MG tablet Take 81 mg by mouth daily. Swallow whole.    [provider]  cyanocobalamin  (VITAMIN B12) 1000 MCG tablet Take 1,000 mcg by mouth daily.    [provider]  ferric citrate  (AURYXIA ) 1 GM 210 MG(Fe) tablet Take 630 mg by mouth 3 (three) times daily with meals.    [provider]  hydrALAZINE  (APRESOLINE ) 25 MG tablet Take 1 tablet (25 mg total) by mouth 3 (three) times daily. Take 50 mg 3 times daily on M, W, F(dialysis days) Take 50 mg twice daily on Tu, Th, Sat, Sun(non-dialysis days) 07/18/23   Rashid, Farhan, MD  insulin  aspart (NOVOLOG ) 100 UNIT/ML injection Inject 0-6 Units into the skin 3 (three) times daily with meals. CBG 70 - 120: 0 units CBG 121 - 150: 0 units CBG 151 - 200: 1 unit CBG 201-250: 2 units CBG 251-300: 3 units CBG 301-350: 4 units CBG 351-400: 5 units CBG > 400: Give 6 units and call MD Patient not taking: Reported on 07/14/2023 02/23/23   Laurence Locus, DO  insulin  aspart protamine  - aspart (NOVOLOG  70/30 FLEXPEN) (70-30) 100 UNIT/ML FlexPen Inject 20 Units into the skin at bedtime.    [provider]  isosorbide  mononitrate (IMDUR ) 30 MG 24 hr tablet Take 1 tablet (30 mg total) by mouth 4 (four) times a week. Mon, Wed, Fri, Sun (non-dialysis days) 10/21/22   [name], [name], MD  methocarbamol  (ROBAXIN ) 500 MG tablet Take 1 tablet (500 mg total) by mouth every 8 (eight) hours as needed for muscle spasms. 07/18/23   Rashid, Farhan, MD  Methoxy PEG-Epoetin Beta (MIRCERA IJ) Inject 1 Dose into the skin as directed. 09/12/22 09/11/23  [provider]  metoprolol  succinate (TOPROL -XL) 50 MG 24 hr tablet Take 50 mg by mouth daily.    [provider]  nitroGLYCERIN  (NITROSTAT ) 0.4 MG SL tablet Place 0.4 mg  under the tongue every 5 (five) minutes as needed for chest pain.    [provider]  ondansetron  (ZOFRAN -ODT) 4 MG disintegrating tablet Take 1 tablet by mouth. Patient not taking: Reported on 07/14/2023 07/12/23   [provider]  oxyCODONE -acetaminophen  (PERCOCET/ROXICET) 5-325 MG tablet Take 1 tablet by mouth every 8 (eight) hours as needed for severe pain (pain score 7-10). 07/18/23   Rashid, Farhan, MD  rosuvastatin  (CRESTOR ) 40 MG tablet Take 40 mg by mouth daily. 01/22/22   [provider]    Physical Exam: Vitals:   07/19/23 1315 07/19/23 1400 07/19/23 1445 07/19/23 1502  BP: (!) 170/70 (!) 159/74 (!) 162/67   Pulse: 62 63 65   Resp: 15 13 15    Temp:    98.6 F (37 C)  TempSrc:    Oral  SpO2: 99% 98% 99%    General: Alert, oriented x3, resting comfortably in  no acute distress Respiratory: Lungs clear to auscultation bilaterally with normal respiratory effort; no w/r/r Cardiovascular: Regular rate and rhythm w/o m/r/g   Data Reviewed:  Lab Results  Component Value Date   WBC 12.4 (H) 07/19/2023   HGB 9.0 (L) 07/19/2023   HCT 26.8 (L) 07/19/2023   MCV 88.2 07/19/2023   PLT 298 07/19/2023   Lab Results  Component Value Date   GLUCOSE 153 (H) 07/19/2023   CALCIUM  8.1 (L) 07/19/2023   NA 129 (L) 07/19/2023   K 4.1 07/19/2023   CO2 23 07/19/2023   CL 89 (L) 07/19/2023   BUN 34 (H) 07/19/2023   CREATININE 8.49 (H) 07/19/2023   Lab Results  Component Value Date   ALT 81 (H) 07/19/2023   AST 49 (H) 07/19/2023   ALKPHOS 88 07/19/2023   BILITOT 0.7 07/19/2023   Lab Results  Component Value Date   INR 1.3 (H) 07/19/2023   INR 1.6 (H) 07/13/2023   INR 1.1 02/16/2023    Radiology: [facility] Chest Port 1 View Result Date: 07/19/2023 CLINICAL DATA:  Questionable sepsis - evaluate for abnormality EXAM: PORTABLE CHEST - 1 VIEW COMPARISON:  None available. FINDINGS: No focal airspace consolidation, pleural effusion, or pneumothorax. Moderate  cardiomegaly. Aortic atherosclerosis. No acute fracture or destructive lesions. Multilevel thoracic osteophytosis. IMPRESSION: Cardiomegaly.  No pneumonia or pulmonary edema. Electronically Signed   By: Rogelia Myers M.D.   On: 07/19/2023 14:01    Assessment and Plan: 46M h/o ESRD (iHD on TTS), HFpEF, CAD, AS, and HTN, HLD, DM2, and recent admission  from 7/11-7/16 for hemorrhaghic shock 2/2 bleeding fistula during HD who underwent fistulogram and angioplasty per IR on 7/14 who now represents today with MSSA bacteremia from lood cultures drawn on prior admission (7/11).   MSSA bacteremia -ID consulted; apprec eval/recs -Continue IV cefazolin  1g daily for now -IV zofran  prn -F/u blood cultures daily until clear q48-72h; will need PICC or abx administration at OP HD -F/u TTE to exclude valvular vegetations and IE -F/u MRI spine to eval for abscess  HTN -PTA amlodipine , Imdur , and metoprolol   DM2 -Diabetes coordinator consulted; apprec eval/recs -Semglee  20U at bedtime + SSI TID AC prn for now -Moderate carb diet   Advance Care Planning:   Code Status: Full Code   Consults: ID  Family Communication: Wife  Severity of Illness: The appropriate patient status for this patient is INPATIENT. Inpatient status is judged to be reasonable and necessary in order to provide the required intensity of service to ensure the patient's safety. The patient's presenting symptoms, physical exam findings, and initial radiographic and laboratory data in the context of their chronic comorbidities is felt to place them at high risk for further clinical deterioration. Furthermore, it is not anticipated that the patient will be medically stable for discharge from the hospital within 2 midnights of admission.   * I certify that at the point of admission it is my clinical judgment that the patient will require inpatient hospital care spanning beyond 2 midnights from the point of admission due to high intensity  of service, high risk for further deterioration and high frequency of surveillance required.*   ------- I spent 55 minutes reviewing previous labs/notes, obtaining separate history at the bedside, counseling/discussing the treatment plan outlined above, ordering medications/tests, and performing clinical documentation.  Author: Marsha Ada, MD 07/19/2023 3:23 PM  For on call review [email or web address].

## 2023-07-19 NOTE — Progress Notes (Signed)
 ID Pharmacist Note  Noted that patient has MSSA in 1/2 sets of blood cultures from 7/11. Given the serious nature of MSSA he will need further work-up. I called Justin [name] this morning to let him know he needs to come back to the hospital for further evaluation. He reports being in a lot of pain but is going to try to find a way back. He will give me a call when he has arranged something. Discussed with ID Dr. Dennise.   [name], PharmD, BCPS, BCIDP Infectious Diseases Clinical Pharmacist Phone: [phone] 07/19/2023 8:21 AM

## 2023-07-19 NOTE — ED Provider Notes (Signed)
 [place] [facility] Provider Note  CSN: [id number] Arrival date & time: 07/19/23 9061  Chief Complaint(s) Abnormal Lab and Back Pain  HPI Justin [name] is a 73 y.o. male with PMH ESRD on hemodialysis, HTN, HLD, recent hospital admission for persistently bleeding AV fistula who presents emergency department for evaluation of positive blood cultures and back pain.  Patient reportedly had left flank pain while in the hospital with a negative stone study and was ultimately discharged but blood cultures were obtained.  Blood cultures were positive for MSSA and he was informed he needs to return to the Emergency Department for IV antibiotics.  Here in the emergency room he endorses persistent left flank pain but denies fever, chest pain, shortness of breath, chills or other systemic symptoms.   Past Medical History Past Medical History:  Diagnosis Date   BPH (benign prostatic hyperplasia)    Diabetes (HCC)    Dysuria    Erectile dysfunction    ESRD on hemodialysis (HCC)    History of chronic CHF 11/19/2019   Hyperlipemia    Hypertension    Personal history of COVID-19 11/19/2019   Pinna disorder, left    Wears glasses    Patient Active Problem List   Diagnosis Date Noted   Bacteremia due to methicillin susceptible Staphylococcus aureus (MSSA) 07/19/2023   Hemorrhagic shock (HCC) 07/13/2023   Bilateral carotid artery stenosis 06/06/2023   Erosive esophagitis 06/06/2023   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) 06/06/2023   Vitamin B12 deficiency 06/06/2023   Dependence on renal dialysis (HCC) 02/26/2023   Fall 02/26/2023   Gastroesophageal reflux disease without esophagitis 02/26/2023   Fever, unspecified 02/26/2023   Headache, unspecified 02/26/2023   Pure hypercholesterolemia 02/26/2023   Other fluid overload 02/26/2023   Esophagitis determined by endoscopy - 02-24-2023 02/25/2023   Insomnia due to medical condition  02/25/2023   Hiccups 02/24/2023   Acute postoperative anemia due to expected blood loss 02/23/2023   Globus sensation 02/23/2023   Delirium due to multiple etiologies 02/23/2023   S/P ORIF (open reduction internal fixation) fracture - right intertrochanteric fracture 02/17/2023   Closed comminuted intertrochanteric fracture of proximal end of femur with nonunion, right 02/16/2023   Type 2 diabetes mellitus with hyperglycemia (HCC) 02/16/2023   BPH (benign prostatic hyperplasia) 10/18/2020   Dysuria 10/18/2020   Pinna disorder, left 10/18/2020   Renal disorder 10/18/2020   Wears glasses 10/18/2020   Coronary artery disease involving native coronary artery of native heart without angina pectoris 11/19/2019   ED (erectile dysfunction) 11/19/2019   Essential hypertension 11/19/2019   ESRD on hemodialysis (HCC) - out-pt HD at [facility] on TTS 5:15 am chair time 11/19/2019   Other disorders of phosphorus metabolism 06/28/2018   Aortic valve disorder 05/07/2018   Mild protein-calorie malnutrition (HCC) 05/02/2018   Anemia in chronic kidney disease 05/01/2018   Coagulation defect, unspecified (HCC) 05/01/2018   Iron  deficiency anemia, unspecified 05/01/2018   Pruritus, unspecified 05/01/2018   Pain, unspecified 05/01/2018   Shortness of breath 05/01/2018   Secondary hyperparathyroidism of renal origin (HCC) 05/01/2018   Nonrheumatic aortic valve stenosis    Chronic systolic CHF (congestive heart failure) (HCC) - now with recovered LVEF 04/24/2018   Hypertensive heart disease with congestive heart failure and stage 5 kidney disease (HCC) 04/24/2018   Type 2 diabetes mellitus with chronic kidney disease on chronic dialysis, with long-term current use of insulin  (HCC) 04/24/2018   Hyperlipidemia 04/24/2018   Long  term (current) use of insulin  (HCC) 04/24/2018   Lower urinary tract symptoms due to benign prostatic hyperplasia 05/20/2014   Home Medication(s) Prior to Admission medications    Medication Sig Start Date End Date Taking? Authorizing Provider  acetaminophen  (TYLENOL ) 500 MG tablet Take 2 tablets (1,000 mg total) by mouth every 6 (six) hours as needed for mild pain (pain score 1-3), fever or headache. 02/23/23   Laurence Locus, DO  amLODipine  (NORVASC ) 10 MG tablet Take 0.5 tablets (5 mg total) by mouth daily. 07/18/23   Rashid, Farhan, MD  aspirin  EC 81 MG tablet Take 81 mg by mouth daily. Swallow whole.    [provider]  cyanocobalamin  (VITAMIN B12) 1000 MCG tablet Take 1,000 mcg by mouth daily.    [provider]  ferric citrate  (AURYXIA ) 1 GM 210 MG(Fe) tablet Take 630 mg by mouth 3 (three) times daily with meals.    [provider]  hydrALAZINE  (APRESOLINE ) 25 MG tablet Take 1 tablet (25 mg total) by mouth 3 (three) times daily. Take 50 mg 3 times daily on M, W, F(dialysis days) Take 50 mg twice daily on Tu, Th, Sat, Sun(non-dialysis days) 07/18/23   Rashid, Farhan, MD  insulin  aspart (NOVOLOG ) 100 UNIT/ML injection Inject 0-6 Units into the skin 3 (three) times daily with meals. CBG 70 - 120: 0 units CBG 121 - 150: 0 units CBG 151 - 200: 1 unit CBG 201-250: 2 units CBG 251-300: 3 units CBG 301-350: 4 units CBG 351-400: 5 units CBG > 400: Give 6 units and call MD Patient not taking: Reported on 07/14/2023 02/23/23   Laurence Locus, DO  insulin  aspart protamine  - aspart (NOVOLOG  70/30 FLEXPEN) (70-30) 100 UNIT/ML FlexPen Inject 20 Units into the skin at bedtime.    [provider]  isosorbide  mononitrate (IMDUR ) 30 MG 24 hr tablet Take 1 tablet (30 mg total) by mouth 4 (four) times a week. Mon, Wed, Fri, Sun (non-dialysis days) 10/21/22   [name], [name], MD  methocarbamol  (ROBAXIN ) 500 MG tablet Take 1 tablet (500 mg total) by mouth every 8 (eight) hours as needed for muscle spasms. 07/18/23   Rashid, Farhan, MD  Methoxy PEG-Epoetin Beta (MIRCERA IJ) Inject 1 Dose into the skin as directed. 09/12/22 09/11/23  [provider]   metoprolol  succinate (TOPROL -XL) 50 MG 24 hr tablet Take 50 mg by mouth daily.    [provider]  nitroGLYCERIN  (NITROSTAT ) 0.4 MG SL tablet Place 0.4 mg under the tongue every 5 (five) minutes as needed for chest pain.    [provider]  ondansetron  (ZOFRAN -ODT) 4 MG disintegrating tablet Take 1 tablet by mouth. Patient not taking: Reported on 07/14/2023 07/12/23   [provider]  oxyCODONE -acetaminophen  (PERCOCET/ROXICET) 5-325 MG tablet Take 1 tablet by mouth every 8 (eight) hours as needed for severe pain (pain score 7-10). 07/18/23   Rashid, Farhan, MD  rosuvastatin  (CRESTOR ) 40 MG tablet Take 40 mg by mouth daily. 01/22/22   [provider]  Past Surgical History Past Surgical History:  Procedure Laterality Date   A/V FISTULAGRAM Left 07/16/2023   Procedure: A/V Fistulagram;  Surgeon: Serene Gaile ORN, MD;  Location: [facility] INVASIVE CV LAB;  Service: Cardiovascular;  Laterality: Left;   A/V SHUNT INTERVENTION N/A 04/16/2023   Procedure: A/V SHUNT INTERVENTION;  Surgeon: Norine Manuelita LABOR, MD;  Location: [facility] INVASIVE CV LAB;  Service: Cardiovascular;  Laterality: N/A;   AV FISTULA PLACEMENT Left 04/30/2018   Procedure: CREATION OF RADIOCEPHALIC ARTERIOVENOUS FISTULA LEFT ARM;  Surgeon: Harvey Carlin BRAVO, MD;  Location: [facility] OR;  Service: Vascular;  Laterality: Left;   AV FISTULA PLACEMENT Left 08/28/2018   Procedure: ARTERIOVENOUS (AV) BRACHIOCEPHALIC FISTULA CREATION LEFT UPPER ARM;  Surgeon: Gretta Lonni PARAS, MD;  Location[facility] OR;  Service: Vascular;  Laterality: Left;   BIOPSY  02/24/2023   Procedure: BIOPSY;  Surgeon: Federico Rosario BROCKS, MD;  Location: [facility] ENDOSCOPY;  Service: Gastroenterology;;   ESOPHAGOGASTRODUODENOSCOPY (EGD) WITH PROPOFOL  N/A 02/24/2023   Procedure: ESOPHAGOGASTRODUODENOSCOPY (EGD) WITH PROPOFOL ;  Surgeon:  Federico Rosario BROCKS, MD;  Location: [facility] ENDOSCOPY;  Service: Gastroenterology;  Laterality: N/A;  Patint is s/p right hip ORIF   INTRAMEDULLARY (IM) NAIL INTERTROCHANTERIC Right 02/17/2023   Procedure: INTRAMEDULLARY (IM) NAIL INTERTROCHANTERIC;  Surgeon: Sherida Adine BROCKS, MD;  Location: [facility] OR;  Service: Orthopedics;  Laterality: Right;   IR FLUORO GUIDE CV LINE RIGHT  04/29/2018   IR US  GUIDE VASC ACCESS RIGHT  04/29/2018   RIGHT/LEFT HEART CATH AND CORONARY ANGIOGRAPHY N/A 05/01/2018   Procedure: RIGHT/LEFT HEART CATH AND CORONARY ANGIOGRAPHY;  Surgeon: Claudene Victory ORN, MD;  Location: [facility] INVASIVE CV LAB;  Service: Cardiovascular;  Laterality: N/A;   RIGHT/LEFT HEART CATH AND CORONARY ANGIOGRAPHY N/A 09/29/2022   Procedure: RIGHT/LEFT HEART CATH AND CORONARY ANGIOGRAPHY;  Surgeon: Wonda Sharper, MD;  Location: [facility] INVASIVE CV LAB;  Service: Cardiovascular;  Laterality: N/A;   subdural hematoma Right 07/2020   Family History Family History  Problem Relation Age of Onset   Stroke Mother    Diabetes Father     Social History Social History   Tobacco Use   Smoking status: Never   Smokeless tobacco: Never  Vaping Use   Vaping status: Never Used  Substance Use Topics   Alcohol  use: Not Currently   Drug use: Never   Allergies Patient has no known allergies.  Review of Systems Review of Systems  Genitourinary:  Positive for flank pain.    Physical Exam Vital Signs  I have reviewed the triage vital signs BP (!) 170/70   Pulse 62   Temp 98.5 F (36.9 C)   Resp 15   SpO2 99%   Physical Exam Constitutional:      General: He is not in acute distress.    Appearance: Normal appearance.  HENT:     Head: Normocephalic and atraumatic.     Nose: No congestion or rhinorrhea.  Eyes:     General:        Right eye: No discharge.        Left eye: No discharge.     Extraocular Movements: Extraocular movements intact.     Pupils: Pupils are equal, round, and reactive to light.   Cardiovascular:     Rate and Rhythm: Normal rate and regular rhythm.     Heart sounds: No murmur heard. Pulmonary:     Effort: No respiratory distress.     Breath sounds: No wheezing or rales.  Abdominal:     General: There is no  distension.     Tenderness: There is no abdominal tenderness. There is left CVA tenderness.  Musculoskeletal:        General: Normal range of motion.     Cervical back: Normal range of motion.  Skin:    General: Skin is warm and dry.  Neurological:     General: No focal deficit present.     Mental Status: He is alert.     ED Results and Treatments Labs (all labs ordered are listed, but only abnormal results are displayed) Labs Reviewed  COMPREHENSIVE METABOLIC PANEL WITH GFR - Abnormal; Notable for the following components:      Result Value   Sodium 129 (*)    Chloride 89 (*)    Glucose, Bld 153 (*)    BUN 34 (*)    Creatinine, Ser 8.49 (*)    Calcium  8.1 (*)    Albumin  2.7 (*)    AST 49 (*)    ALT 81 (*)    GFR, Estimated 6 (*)    Anion gap 17 (*)    All other components within normal limits  CBC WITH DIFFERENTIAL/PLATELET - Abnormal; Notable for the following components:   WBC 12.4 (*)    RBC 3.04 (*)    Hemoglobin 9.0 (*)    HCT 26.8 (*)    Neutro Abs 10.3 (*)    Abs Immature Granulocytes 0.15 (*)    All other components within normal limits  PROTIME-INR - Abnormal; Notable for the following components:   Prothrombin Time 16.5 (*)    INR 1.3 (*)    All other components within normal limits  CULTURE, BLOOD (ROUTINE X 2)  CULTURE, BLOOD (ROUTINE X 2)  HEMOGLOBIN A1C  I-STAT CG4 LACTIC ACID, ED  I-STAT CG4 LACTIC ACID, ED                                                                                                                          Radiology No results found.  Pertinent labs & imaging results that were available during my care of the patient were reviewed by me and considered in my medical decision making (see MDM for  details).  Medications Ordered in ED Medications  HYDROcodone -acetaminophen  (NORCO/VICODIN) 5-325 MG per tablet 1 tablet (has no administration in time range)  ceFAZolin  (ANCEF ) IVPB 1 g/50 mL premix (1 g Intravenous New Bag/Given 07/19/23 1334)  morphine  (PF) 2 MG/ML injection 4 mg (has no administration in time range)  ondansetron  (ZOFRAN ) injection 4 mg (has no administration in time range)  ceFAZolin  (ANCEF ) IVPB 1 g/50 mL premix (has no administration in time range)  heparin  injection 5,000 Units (has no administration in time range)  Procedures Procedures  (including critical care time)  Medical Decision Making / ED Course   This patient presents to the ED for concern of flank pain, bacteremia, this involves an extensive number of treatment options, and is a complaint that carries with it a high risk of complications and morbidity.  The differential diagnosis includes bacteremia, sepsis, epidural abscess, septic stone, UTI  MDM: Seen emerged part for evaluation of flank pain and positive blood cultures.  Physical exam with left CVA tenderness but is otherwise unremarkable.  Laboratory evaluation with leukocytosis to 12.4, hemoglobin 9.0, sodium 129, BUN 34, creatinine 8.49, lactic acid is normal.  Spoke with the infectious disease physician on-call Dr. Dennise who states that he is unfortunately not an oral antibiotic candidates and will require hospital admission.  Cefazolin  initiated and patient admitted.   Additional history obtained: -Additional history obtained from wife -External records from outside source obtained and reviewed including: Chart review including previous notes, labs, imaging, consultation notes   Lab Tests: -I ordered, reviewed, and interpreted labs.   The pertinent results include:   Labs Reviewed  COMPREHENSIVE METABOLIC  PANEL WITH GFR - Abnormal; Notable for the following components:      Result Value   Sodium 129 (*)    Chloride 89 (*)    Glucose, Bld 153 (*)    BUN 34 (*)    Creatinine, Ser 8.49 (*)    Calcium  8.1 (*)    Albumin  2.7 (*)    AST 49 (*)    ALT 81 (*)    GFR, Estimated 6 (*)    Anion gap 17 (*)    All other components within normal limits  CBC WITH DIFFERENTIAL/PLATELET - Abnormal; Notable for the following components:   WBC 12.4 (*)    RBC 3.04 (*)    Hemoglobin 9.0 (*)    HCT 26.8 (*)    Neutro Abs 10.3 (*)    Abs Immature Granulocytes 0.15 (*)    All other components within normal limits  PROTIME-INR - Abnormal; Notable for the following components:   Prothrombin Time 16.5 (*)    INR 1.3 (*)    All other components within normal limits  CULTURE, BLOOD (ROUTINE X 2)  CULTURE, BLOOD (ROUTINE X 2)  HEMOGLOBIN A1C  I-STAT CG4 LACTIC ACID, ED  I-STAT CG4 LACTIC ACID, ED         Imaging Studies ordered: I ordered imaging studies including MRI L-spine and this is pending  Medicines ordered and prescription drug management: Meds ordered this encounter  Medications   HYDROcodone -acetaminophen  (NORCO/VICODIN) 5-325 MG per tablet 1 tablet    Refill:  0   ceFAZolin  (ANCEF ) IVPB 1 g/50 mL premix    Antibiotic Indication::   Bacteremia   morphine  (PF) 2 MG/ML injection 4 mg   ondansetron  (ZOFRAN ) injection 4 mg   DISCONTD: enoxaparin  (LOVENOX ) injection 40 mg   ceFAZolin  (ANCEF ) IVPB 1 g/50 mL premix    Antibiotic Indication::   Bacteremia   heparin  injection 5,000 Units    -I have reviewed the patients home medicines and have made adjustments as needed  Critical interventions none  Consultations Obtained: I requested consultation with the infectious disease physician on-call,  and discussed lab and imaging findings as well as pertinent plan - they recommend: Cefazolin  and admit   Cardiac Monitoring: The patient was maintained on a cardiac monitor.  I personally  viewed and interpreted the cardiac monitored which showed an underlying rhythm of: NSR  Social Determinants of Health:  Factors impacting patients care include: none   Reevaluation: After the interventions noted above, I reevaluated the patient and found that they have :stayed the same  Co morbidities that complicate the patient evaluation  Past Medical History:  Diagnosis Date   BPH (benign prostatic hyperplasia)    Diabetes (HCC)    Dysuria    Erectile dysfunction    ESRD on hemodialysis (HCC)    History of chronic CHF 11/19/2019   Hyperlipemia    Hypertension    Personal history of COVID-19 11/19/2019   Pinna disorder, left    Wears glasses       Dispostion: I considered admission for this patient, and patient will require hospital mission for positive blood cultures     Final Clinical Impression(s) / ED Diagnoses Final diagnoses:  Bacteremia     @PCDICTATION @    Albertina Dixon, MD 07/19/23 [id number]

## 2023-07-19 NOTE — ED Notes (Signed)
 Gone to MRI

## 2023-07-19 NOTE — ED Notes (Signed)
 Report given to Derk, RN on 5th floor at [facility] ETA 2 hours

## 2023-07-19 NOTE — ED Notes (Signed)
 2nd lactic due @1343 

## 2023-07-19 NOTE — ED Triage Notes (Addendum)
 Friend stated, He got a call from the hospital this morning and said to come back here he had a bacterial infection. He is also having back back pain which is all the time

## 2023-07-20 ENCOUNTER — Encounter (HOSPITAL_COMMUNITY): Payer: Self-pay | Admitting: Hospitalist

## 2023-07-20 ENCOUNTER — Ambulatory Visit: Payer: Self-pay | Admitting: Cardiology

## 2023-07-20 ENCOUNTER — Inpatient Hospital Stay (HOSPITAL_COMMUNITY)

## 2023-07-20 DIAGNOSIS — Z992 Dependence on renal dialysis: Secondary | ICD-10-CM

## 2023-07-20 DIAGNOSIS — N186 End stage renal disease: Secondary | ICD-10-CM

## 2023-07-20 DIAGNOSIS — B9561 Methicillin susceptible Staphylococcus aureus infection as the cause of diseases classified elsewhere: Secondary | ICD-10-CM | POA: Diagnosis not present

## 2023-07-20 DIAGNOSIS — R7881 Bacteremia: Secondary | ICD-10-CM | POA: Diagnosis not present

## 2023-07-20 DIAGNOSIS — L02414 Cutaneous abscess of left upper limb: Secondary | ICD-10-CM

## 2023-07-20 DIAGNOSIS — T82838A Hemorrhage of vascular prosthetic devices, implants and grafts, initial encounter: Secondary | ICD-10-CM

## 2023-07-20 HISTORY — PX: IR FLUORO GUIDED NEEDLE PLC ASPIRATION/INJECTION LOC: IMG2395

## 2023-07-20 LAB — CBC
HCT: 23.6 % — ABNORMAL LOW (ref 39.0–52.0)
Hemoglobin: 7.9 g/dL — ABNORMAL LOW (ref 13.0–17.0)
MCH: 28.9 pg (ref 26.0–34.0)
MCHC: 33.5 g/dL (ref 30.0–36.0)
MCV: 86.4 fL (ref 80.0–100.0)
Platelets: 294 K/uL (ref 150–400)
RBC: 2.73 MIL/uL — ABNORMAL LOW (ref 4.22–5.81)
RDW: 14.3 % (ref 11.5–15.5)
WBC: 10.6 K/uL — ABNORMAL HIGH (ref 4.0–10.5)
nRBC: 0 % (ref 0.0–0.2)

## 2023-07-20 LAB — BASIC METABOLIC PANEL WITH GFR
Anion gap: 16 — ABNORMAL HIGH (ref 5–15)
BUN: 41 mg/dL — ABNORMAL HIGH (ref 8–23)
CO2: 23 mmol/L (ref 22–32)
Calcium: 7.6 mg/dL — ABNORMAL LOW (ref 8.9–10.3)
Chloride: 90 mmol/L — ABNORMAL LOW (ref 98–111)
Creatinine, Ser: 9.64 mg/dL — ABNORMAL HIGH (ref 0.61–1.24)
GFR, Estimated: 5 mL/min — ABNORMAL LOW (ref >60)
Glucose, Bld: 141 mg/dL — ABNORMAL HIGH (ref 70–99)
Potassium: 4 mmol/L (ref 3.5–5.1)
Sodium: 129 mmol/L — ABNORMAL LOW (ref 135–145)

## 2023-07-20 LAB — GLUCOSE, CAPILLARY
Glucose-Capillary: 143 mg/dL — ABNORMAL HIGH (ref 70–99)
Glucose-Capillary: 158 mg/dL — ABNORMAL HIGH (ref 70–99)
Glucose-Capillary: 146 mg/dL — ABNORMAL HIGH (ref 70–99)
Glucose-Capillary: 194 mg/dL — ABNORMAL HIGH (ref 70–99)

## 2023-07-20 LAB — CULTURE, BLOOD (ROUTINE X 2)

## 2023-07-20 LAB — HEPATITIS B SURFACE ANTIGEN: Hepatitis B Surface Ag: NONREACTIVE

## 2023-07-20 MED ORDER — HYDROMORPHONE HCL 1 MG/ML IJ SOLN
0.4000 mg | INTRAMUSCULAR | Status: DC | PRN
  Administered 2023-07-21 – 2023-07-24 (×7): 0.4 mg via INTRAVENOUS
  Filled 2023-07-20 (×5): qty 0.5

## 2023-07-20 MED ORDER — MIDAZOLAM HCL 2 MG/2ML IJ SOLN
INTRAMUSCULAR | Status: AC
  Filled 2023-07-20: qty 2

## 2023-07-20 MED ORDER — LIDOCAINE HCL (PF) 1 % IJ SOLN
INTRAMUSCULAR | Status: AC
  Filled 2023-07-20: qty 30

## 2023-07-20 MED ORDER — GABAPENTIN 300 MG PO CAPS: 300.0000 mg | ORAL_CAPSULE | Freq: Every day | ORAL | Status: DC

## 2023-07-20 MED ORDER — DARBEPOETIN ALFA 200 MCG/0.4ML IJ SOSY: 200.0000 ug | PREFILLED_SYRINGE | INTRAMUSCULAR | Status: DC

## 2023-07-20 MED ORDER — FENTANYL CITRATE (PF) 100 MCG/2ML IJ SOLN
INTRAMUSCULAR | Status: AC
  Filled 2023-07-20: qty 2

## 2023-07-20 MED ORDER — DARBEPOETIN ALFA 200 MCG/0.4ML IJ SOSY
200.0000 ug | PREFILLED_SYRINGE | INTRAMUSCULAR | Status: DC
  Administered 2023-07-22: 200 ug via SUBCUTANEOUS
  Filled 2023-07-20 (×2): qty 0.4

## 2023-07-20 MED ORDER — PROSOURCE PLUS PO LIQD
30.0000 mL | Freq: Two times a day (BID) | ORAL | Status: DC
  Administered 2023-07-24: 30 mL via ORAL
  Filled 2023-07-20 (×3): qty 30

## 2023-07-20 NOTE — Progress Notes (Signed)
 [facility]  Date of Admission:  07/19/2023      Total days of antibiotics 2 days        Day 1 through 2 IV cefazolin  1 g every 24 hours  73 year old male with a past medical history of ESRD on HD through AVF, HFpEF, CAD, AS, HTN, HLD, and DM2.   ASSESSMENT and PLAN:  MSSA Bacteremia and L3/4 abscess -Recently admitted on 7/11 for hemorrhagic shock due to bleeding AVF during HD. AVF repaired by IR on 7/14 and discharged on 7/16. Cultures drawn on 7/11 came back on day 5 in 1/4 bottles positive for MSSA. Patient was called and told to come to [facility] ED for treatment.  -Patient having significant back pain, MRI ordered in ED resulted in a L3-L4 epidural abscess. IR consulted and plan to aspirate 7/18 or 7/19, depending on schedule and oral intake.  -WBC: on 7/17 12.4, on 7/18 10.6. Patient was afebrile this morning.  -Left upper extremity US  revealed no evidence of abscess. -Blood cultures on 7/11 resulted in MSSA, 7/17 pending results show GPC. -Plan to order TEE to evaluate source and seeding of infection. -Plan to continue IV Cefazolin , duration depends on findings from TEE  Principal Problem:   Bacteremia due to methicillin susceptible Staphylococcus aureus (MSSA)    (feeding supplement) PROSource Plus  30 mL Oral BID BM   acetaminophen   1,000 mg Oral TID   amLODipine   5 mg Oral Daily   aspirin  EC  81 mg Oral Daily   [START ON 07/24/2023] darbepoetin (ARANESP ) injection - DIALYSIS  200 mcg Subcutaneous Q Tue-1800   heparin  injection (subcutaneous)  5,000 Units Subcutaneous Q8H   insulin  aspart  0-15 Units Subcutaneous TID WC   insulin  glargine-yfgn  20 Units Subcutaneous QHS   isosorbide  mononitrate  30 mg Oral Once per day on Sunday Tuesday Thursday Saturday   methocarbamol   500 mg Oral TID   metoprolol  succinate  50 mg Oral Daily    morphine  injection  4 mg Intravenous Once   ondansetron  (ZOFRAN ) IV  4 mg Intravenous Once   rosuvastatin   40  mg Oral Daily    SUBJECTIVE: Patient accompanied by his partner. They understand the patient has an abscess in his back and that other specialty teams will assist in treating abscess. Discussed that we would like to do a procedure, TEE, to see if the infection has spread to his heart.   Review of Systems: Review of Systems  Reason unable to perform ROS: patient using restroom for most of the encounter. spoke with partner.    No Known Allergies  OBJECTIVE: Vitals:   07/20/23 0145 07/20/23 0200 07/20/23 0355 07/20/23 0909  BP:  (!) 134/56 (!) 134/56 (!) 144/51  Pulse: 71 70 65 62  Resp:  19 15 19   Temp:   98.6 F (37 C) 98.1 F (36.7 C)  TempSrc:   Oral Oral  SpO2: 99% 99% 100% 98%   There is no height or weight on file to calculate BMI.  Physical Exam Vitals reviewed.  Musculoskeletal:     Comments: Able to ambulate to the restroom using walker.   Neurological:     Mental Status: He is alert.     Lab Results Lab Results  Component Value Date   WBC 10.6 (H) 07/20/2023   HGB 7.9 (L) 07/20/2023   HCT 23.6 (L) 07/20/2023   MCV 86.4 07/20/2023   PLT 294 07/20/2023  Lab Results  Component Value Date   CREATININE 9.64 (H) 07/20/2023   BUN 41 (H) 07/20/2023   NA 129 (L) 07/20/2023   K 4.0 07/20/2023   CL 90 (L) 07/20/2023   CO2 23 07/20/2023    Lab Results  Component Value Date   ALT 81 (H) 07/19/2023   AST 49 (H) 07/19/2023   ALKPHOS 88 07/19/2023   BILITOT 0.7 07/19/2023     Microbiology: Recent Results (from the past 240 hours)  MRSA Next Gen by PCR, Nasal     Status: None   Collection Time: 07/13/23  6:57 PM   Specimen: Nasal Mucosa; Nasal Swab  Result Value Ref Range Status   MRSA by PCR Next Gen NOT DETECTED NOT DETECTED Final    Comment: (NOTE) The GeneXpert MRSA Assay (FDA approved for NASAL specimens only), is one component of a comprehensive MRSA colonization surveillance program. It is not intended to diagnose MRSA infection nor to guide or  monitor treatment for MRSA infections. Test performance is not FDA approved in patients less than 25 years old. Performed at [facility], [address], [place], [place] [address]   Culture, blood (Routine X 2) w Reflex to ID Panel     Status: Abnormal   Collection Time: 07/13/23 11:08 PM   Specimen: BLOOD RIGHT ARM  Result Value Ref Range Status   Specimen Description BLOOD RIGHT ARM  Final   Special Requests   Final    BOTTLES DRAWN AEROBIC AND ANAEROBIC Blood Culture results may not be optimal due to an inadequate volume of blood received in culture bottles   Culture  Setup Time   Final    GRAM POSITIVE COCCI ANAEROBIC BOTTLE ONLY CRITICAL RESULT CALLED TO, READ BACK BY AND VERIFIED WITH: DR SUNDIL [phone] FCP Performed at [facility], [address], [place], [place] [address]    Culture STAPHYLOCOCCUS AUREUS (A)  Final   Report Status 07/20/2023 FINAL  Final   Organism ID, Bacteria STAPHYLOCOCCUS AUREUS  Final      Susceptibility   Staphylococcus aureus - MIC*    CIPROFLOXACIN <=0.5 SENSITIVE Sensitive     ERYTHROMYCIN <=0.25 SENSITIVE Sensitive     GENTAMICIN <=0.5 SENSITIVE Sensitive     OXACILLIN 0.5 SENSITIVE Sensitive     TETRACYCLINE <=1 SENSITIVE Sensitive     VANCOMYCIN  1 SENSITIVE Sensitive     TRIMETH/SULFA <=10 SENSITIVE Sensitive     CLINDAMYCIN <=0.25 SENSITIVE Sensitive     RIFAMPIN <=0.5 SENSITIVE Sensitive     Inducible Clindamycin NEGATIVE Sensitive     LINEZOLID 2 SENSITIVE Sensitive     * STAPHYLOCOCCUS AUREUS  Culture, blood (Routine X 2) w Reflex to ID Panel     Status: None   Collection Time: 07/13/23 11:08 PM   Specimen: BLOOD RIGHT HAND  Result Value Ref Range Status   Specimen Description BLOOD RIGHT HAND  Final   Special Requests   Final    BOTTLES DRAWN AEROBIC ONLY Blood Culture adequate volume   Culture   Final    NO GROWTH 5 DAYS Performed at [facility], [address] [place], [place] [address]    Report  Status 07/18/2023 FINAL  Final  Blood Culture ID Panel (Reflexed)     Status: Abnormal   Collection Time: 07/13/23 11:08 PM  Result Value Ref Range Status   Enterococcus faecalis NOT DETECTED NOT DETECTED Final   Enterococcus Faecium NOT DETECTED NOT DETECTED Final   Listeria  monocytogenes NOT DETECTED NOT DETECTED Final   Staphylococcus species DETECTED (A) NOT DETECTED Final    Comment: CRITICAL RESULT CALLED TO, READ BACK BY AND VERIFIED WITH: DR LEE [phone] FCP    Staphylococcus aureus (BCID) DETECTED (A) NOT DETECTED Final    Comment: CRITICAL RESULT CALLED TO, READ BACK BY AND VERIFIED WITH: DR LEE [phone] FCP    Staphylococcus epidermidis NOT DETECTED NOT DETECTED Final   Staphylococcus lugdunensis NOT DETECTED NOT DETECTED Final   Streptococcus species NOT DETECTED NOT DETECTED Final   Streptococcus agalactiae NOT DETECTED NOT DETECTED Final   Streptococcus pneumoniae NOT DETECTED NOT DETECTED Final   Streptococcus pyogenes NOT DETECTED NOT DETECTED Final   A.calcoaceticus-baumannii NOT DETECTED NOT DETECTED Final   Bacteroides fragilis NOT DETECTED NOT DETECTED Final   Enterobacterales NOT DETECTED NOT DETECTED Final   Enterobacter cloacae complex NOT DETECTED NOT DETECTED Final   Escherichia coli NOT DETECTED NOT DETECTED Final   Klebsiella aerogenes NOT DETECTED NOT DETECTED Final   Klebsiella oxytoca NOT DETECTED NOT DETECTED Final   Klebsiella pneumoniae NOT DETECTED NOT DETECTED Final   Proteus species NOT DETECTED NOT DETECTED Final   Salmonella species NOT DETECTED NOT DETECTED Final   Serratia marcescens NOT DETECTED NOT DETECTED Final   Haemophilus influenzae NOT DETECTED NOT DETECTED Final   Neisseria meningitidis NOT DETECTED NOT DETECTED Final   Pseudomonas aeruginosa NOT DETECTED NOT DETECTED Final   Stenotrophomonas maltophilia NOT DETECTED NOT DETECTED Final   Candida albicans NOT DETECTED NOT DETECTED Final   Candida auris NOT DETECTED NOT  DETECTED Final   Candida glabrata NOT DETECTED NOT DETECTED Final   Candida krusei NOT DETECTED NOT DETECTED Final   Candida parapsilosis NOT DETECTED NOT DETECTED Final   Candida tropicalis NOT DETECTED NOT DETECTED Final   Cryptococcus neoformans/gattii NOT DETECTED NOT DETECTED Final   Meth resistant mecA/C and MREJ NOT DETECTED NOT DETECTED Final    Comment: Performed at [facility], [address], [place], [place] [address]  Blood Culture (routine x 2)     Status: None (Preliminary result)   Collection Time: 07/19/23 11:34 AM   Specimen: BLOOD  Result Value Ref Range Status   Specimen Description BLOOD RIGHT ANTECUBITAL  Final   Special Requests   Final    BOTTLES DRAWN AEROBIC AND ANAEROBIC Blood Culture adequate volume   Culture  Setup Time   Final    GRAM POSITIVE COCCI IN CLUSTERS AEROBIC BOTTLE ONLY CRITICAL RESULT CALLED TO, READ BACK BY AND VERIFIED WITH: PHARMD C.  AMAND [phone] FCP Performed at [facility], [address], [place], [place] [address]    Culture GRAM POSITIVE COCCI  Final   Report Status PENDING  Incomplete    Viktoria King, DO, [facility] Resident Physician  @TODAY @ 2:15 PM   Total Encounter Time: 8 minutes

## 2023-07-20 NOTE — Progress Notes (Signed)
 New Admission Note:  Arrival Method: By stretcher from ED  Mental Orientation: Alert and oriented x 4 Telemetry: None Assessment: Completed Skin: Pt refused to remove pants at this time IV: R forearm S.L. Pain: 8/10 back Tubes: None Safety Measures: Safety Fall Prevention Plan was given, discussed and signed. Admission: Completed [facility] Orientation: Patient has been orientated to the room, unit and the staff. Family: None  Orders have been reviewed and implemented. Will continue to monitor the patient. Call light has been placed within reach and bed alarm has been activated.   Bari Lor, RN  Phone Number: [phone]

## 2023-07-20 NOTE — Progress Notes (Signed)
  Progress Note    07/20/2023 2:39 PM * No surgery found *  Called by Izetta Boehringer PA-C regarding actively bleeding left AV fistula. On arrival to room multiple RN's in room trying to obtain pressure hemostasis. With manual occlusion of fistula we were able to visualize area where very small scab was avulsed. A 4-0 Monocryl figure of 8 suture was placed. Hemostasis was achieved. No further bleeding appreciated. Fistula still with excellent thrill. Area cleaned with betadine and 4x4 gauze and light Kerlix wrap applied. Vascular is available if any further issues with fistula.   Teretha Damme, PA-C [facility] [phone] 07/20/2023 2:39 PM

## 2023-07-20 NOTE — Procedures (Signed)
  Procedure:  FLuoro guided aspiration of Left lumbar paraspinal process 1ml purulent, sent for GS& cx Preprocedure diagnosis: The encounter diagnosis was Bacteremia. Postprocedure diagnosis: same EBL:    minimal Complications:   none immediate  See full dictation in [employer].  CHARM Toribio Faes MD Main # [phone] Pager  [phone] Mobile [phone]

## 2023-07-20 NOTE — ED Notes (Signed)
 Pt is awake watching tv. Remains on vs monitor. Call bell within reach. Denies any needs at this time

## 2023-07-20 NOTE — Consult Note (Signed)
 Chief Complaint: Paraspinal fluid collection  Referring Provider(s): Dr. Jearlean  Supervising Physician: Johann Sieving  Patient Status: [facility] - In-pt  History of Present Illness: Justin [name] is a 73 y.o. male with PMH significant for ESRD (HD), HF, CAD, HTN, DM. He presented to the ER on 7/17 after being called by Jolynn Pack instructing him to return for bacterial infection indicated in blood cultures (blood cultures drawn during hospitalization for bleeding fistula). ED imaging for additional complaint of flank pain revealed 3cm L paraspinal fluid collection. IR consulted to aspirate.  S/p 7/14 fistulogram and angioplasty with Dr. Serene.   Confirms NPO since 10am when he reports eating food brought by family.    Denies fever, chills, SOB, CP, sore throat, N/V, abd pain, blood in stool or urine, abnormal bruising. Endorses L lower back pain, worse with movement.   Allergies Reviewed:  Patient has no known allergies.   Patient is Full Code  Past Medical History:  Diagnosis Date   BPH (benign prostatic hyperplasia)    Diabetes (HCC)    Dysuria    Erectile dysfunction    ESRD on hemodialysis (HCC)    History of chronic CHF 11/19/2019   Hyperlipemia    Hypertension    Personal history of COVID-19 11/19/2019   Pinna disorder, left    Wears glasses     Past Surgical History:  Procedure Laterality Date   A/V FISTULAGRAM Left 07/16/2023   Procedure: A/V Fistulagram;  Surgeon: Serene Gaile LELON, MD;  Location: [facility] INVASIVE CV LAB;  Service: Cardiovascular;  Laterality: Left;   A/V SHUNT INTERVENTION N/A 04/16/2023   Procedure: A/V SHUNT INTERVENTION;  Surgeon: Norine Manuelita LABOR, MD;  Location: [facility] INVASIVE CV LAB;  Service: Cardiovascular;  Laterality: N/A;   AV FISTULA PLACEMENT Left 04/30/2018   Procedure: CREATION OF RADIOCEPHALIC ARTERIOVENOUS FISTULA LEFT ARM;  Surgeon: Harvey Carlin BRAVO, MD;  Location: [facility] OR;  Service: Vascular;  Laterality: Left;   AV FISTULA  PLACEMENT Left 08/28/2018   Procedure: ARTERIOVENOUS (AV) BRACHIOCEPHALIC FISTULA CREATION LEFT UPPER ARM;  Surgeon: Gretta Lonni PARAS, MD;  Location: [facility] OR;  Service: Vascular;  Laterality: Left;   BIOPSY  02/24/2023   Procedure: BIOPSY;  Surgeon: Federico Rosario BROCKS, MD;  Location: [facility] ENDOSCOPY;  Service: Gastroenterology;;   ESOPHAGOGASTRODUODENOSCOPY (EGD) WITH PROPOFOL  N/A 02/24/2023   Procedure: ESOPHAGOGASTRODUODENOSCOPY (EGD) WITH PROPOFOL ;  Surgeon: Federico Rosario BROCKS, MD;  Location: [facility] ENDOSCOPY;  Service: Gastroenterology;  Laterality: N/A;  Patint is s/p right hip ORIF   INTRAMEDULLARY (IM) NAIL INTERTROCHANTERIC Right 02/17/2023   Procedure: INTRAMEDULLARY (IM) NAIL INTERTROCHANTERIC;  Surgeon: Sherida Adine BROCKS, MD;  Location: [facility] OR;  Service: Orthopedics;  Laterality: Right;   IR FLUORO GUIDE CV LINE RIGHT  04/29/2018   IR US  GUIDE VASC ACCESS RIGHT  04/29/2018   RIGHT/LEFT HEART CATH AND CORONARY ANGIOGRAPHY N/A 05/01/2018   Procedure: RIGHT/LEFT HEART CATH AND CORONARY ANGIOGRAPHY;  Surgeon: Claudene Victory LELON, MD;  Location: [facility] INVASIVE CV LAB;  Service: Cardiovascular;  Laterality: N/A;   RIGHT/LEFT HEART CATH AND CORONARY ANGIOGRAPHY N/A 09/29/2022   Procedure: RIGHT/LEFT HEART CATH AND CORONARY ANGIOGRAPHY;  Surgeon: Wonda Sharper, MD;  Location: [facility] INVASIVE CV LAB;  Service: Cardiovascular;  Laterality: N/A;   subdural hematoma Right 07/2020      Medications: Prior to Admission medications   Medication Sig Start Date End Date Taking? Authorizing Provider  acetaminophen  (TYLENOL ) 500 MG tablet Take 2 tablets (1,000 mg total) by mouth every 6 (six) hours as needed  for mild pain (pain score 1-3), fever or headache. 02/23/23  Yes Laurence Locus, DO  amLODipine  (NORVASC ) 5 MG tablet Take 5 mg by mouth daily.   Yes [provider]  aspirin  EC 81 MG tablet Take 81 mg by mouth daily. Swallow whole.   Yes [provider]  cyanocobalamin  (VITAMIN B12) 1000 MCG tablet Take 1,000  mcg by mouth daily.   Yes [provider]  ferric citrate  (AURYXIA ) 1 GM 210 MG(Fe) tablet Take 630 mg by mouth 3 (three) times daily with meals.   Yes [provider]  hydrALAZINE  (APRESOLINE ) 25 MG tablet Take 1 tablet (25 mg total) by mouth 3 (three) times daily. Take 50 mg 3 times daily on M, W, F(dialysis days) Take 50 mg twice daily on Tu, Th, Sat, Sun(non-dialysis days) Patient taking differently: Take 50-150 mg by mouth See admin instructions. Take two tablets (50 mg) 2 times daily on M, W, Friday Sun(non dialysis days) Take 150 mg twice daily on Tu, Th, Sat,(dialysis days) 07/18/23  Yes Rashid, Farhan, MD  insulin  aspart protamine  - aspart (NOVOLOG  70/30 FLEXPEN) (70-30) 100 UNIT/ML FlexPen Inject 20 Units into the skin at bedtime.   Yes [provider]  isosorbide  mononitrate (IMDUR ) 30 MG 24 hr tablet Take 1 tablet (30 mg total) by mouth 4 (four) times a week. Mon, Wed, Fri, Sun (non-dialysis days) 10/21/22  Yes [name], [name], MD  methocarbamol  (ROBAXIN ) 500 MG tablet Take 1 tablet (500 mg total) by mouth every 8 (eight) hours as needed for muscle spasms. 07/18/23  Yes Rashid, Farhan, MD  metoprolol  succinate (TOPROL -XL) 50 MG 24 hr tablet Take 50 mg by mouth daily.   Yes [provider]  ondansetron  (ZOFRAN -ODT) 4 MG disintegrating tablet Take 1 tablet by mouth. 07/12/23  Yes [provider]  oxyCODONE -acetaminophen  (PERCOCET/ROXICET) 5-325 MG tablet Take 1 tablet by mouth every 8 (eight) hours as needed for severe pain (pain score 7-10). 07/18/23  Yes Rashid, Farhan, MD  rosuvastatin  (CRESTOR ) 40 MG tablet Take 40 mg by mouth daily. 01/22/22  Yes [provider]  amLODipine  (NORVASC ) 10 MG tablet Take 0.5 tablets (5 mg total) by mouth daily. Patient not taking: Reported on 07/19/2023 07/18/23   Rashid, Farhan, MD  nitroGLYCERIN  (NITROSTAT ) 0.4 MG SL tablet Place 0.4 mg under the tongue every 5 (five) minutes as needed for chest pain.     [provider]     Family History  Problem Relation Age of Onset   Stroke Mother    Diabetes Father     Social History   Socioeconomic History   Marital status: Divorced    Spouse name: Not on file   Number of children: 1   Years of education: Not on file   Highest education level: Not on file  Occupational History   Not on file  Tobacco Use   Smoking status: Never   Smokeless tobacco: Never  Vaping Use   Vaping status: Never Used  Substance and Sexual Activity   Alcohol  use: Not Currently   Drug use: Never   Sexual activity: Not Currently  Other Topics Concern   Not on file  Social History Narrative   Not on file   Social Drivers of Health   Financial Resource Strain: Low Risk  (07/13/2020)   Received from [facility]   Overall Financial Resource Strain (CARDIA)    Difficulty of Paying Living Expenses: Not very hard  Food Insecurity: No Food Insecurity (07/14/2023)   Hunger  Vital Sign    Worried About Programme researcher, broadcasting/film/video in the Last Year: Never true    Ran Out of Food in the Last Year: Never true  Transportation Needs: No Transportation Needs (07/14/2023)   PRAPARE - Administrator, Civil Service (Medical): No    Lack of Transportation (Non-Medical): No  Physical Activity: Not on file  Stress: Not on file  Social Connections: Socially Integrated (07/14/2023)   Social Connection and Isolation Panel    Frequency of Communication with Friends and Family: More than three times a week    Frequency of Social Gatherings with Friends and Family: More than three times a week    Attends Religious Services: More than 4 times per year    Active Member of [employer] or Organizations: Yes    Attends Banker Meetings: 1 to 4 times per year    Marital Status: Living with partner     Review of Systems: A 12 point ROS discussed and pertinent positives are indicated in the HPI above.  All other systems are negative.    Vital Signs: BP (!)  144/51 (BP Location: Left Leg)   Pulse 62   Temp 98.1 F (36.7 C) (Oral)   Resp 19   SpO2 98%     Physical Exam HENT:     Mouth/Throat:     Mouth: Mucous membranes are moist.     Pharynx: Oropharynx is clear.  Cardiovascular:     Pulses: Normal pulses.  Pulmonary:     Effort: Pulmonary effort is normal.  Abdominal:     Palpations: Abdomen is soft.     Tenderness: There is no abdominal tenderness.  Musculoskeletal:     Comments: L lumbar paraspin region tender to palpation, without erythema/wounds/swelling  Skin:    General: Skin is warm and dry.  Neurological:     Mental Status: He is alert and oriented to person, place, and time.  Psychiatric:        Mood and Affect: Mood normal.        Behavior: Behavior normal.        Thought Content: Thought content normal.     Imaging: US  LT UPPER EXTREM LTD SOFT TISSUE NON VASCULAR Result Date: 07/20/2023 CLINICAL DATA:  Infection at AV fistula site. EXAM: ULTRASOUND left UPPER EXTREMITY LIMITED TECHNIQUE: Ultrasound examination of the upper extremity soft tissues was performed in the area of clinical concern. COMPARISON:  None Available. FINDINGS: AV fistula is visualized. No soft tissue edema or organized fluid collection. IMPRESSION: No soft tissue edema or organized fluid collection to suggest abscess. Electronically Signed   By: Newell Eke M.D.   On: 07/20/2023 09:15   MR Lumbar Spine W Wo Contrast Addendum Date: 07/19/2023 ADDENDUM REPORT: 07/19/2023 18:25 ADDENDUM: These results were called by telephone at the time of interpretation on 07/19/2023 at 6:24 pm to provider Dr. Marsha Ada, who verbally acknowledged these results. Electronically Signed   By: Evalene Coho M.D.   On: 07/19/2023 18:25   Result Date: 07/19/2023 CLINICAL DATA:  Myelopathy, acute, lumbar spine concern for epidural abscess EXAM: MRI LUMBAR SPINE WITHOUT AND WITH CONTRAST TECHNIQUE: Multiplanar and multiecho pulse sequences of the lumbar spine were  obtained without and with intravenous contrast. CONTRAST:  7mL GADAVIST  GADOBUTROL  1 MMOL/ML IV SOLN COMPARISON:  None Available. FINDINGS: Segmentation:  Standard. Alignment:  Physiologic. Vertebrae: Vertebral bodies are diffusely heterogeneous in signal intensity. There are discogenic reactive changes also present within the endplates at L2-3 and  L3-4. Conus medullaris and cauda equina: Conus extends to the T12-L1 level. The conus appears normal. There is central crowding of the nerve roots at L3-4 secondary to an epidural fluid collection. Paraspinal and other soft tissues: There is a left posterolateral epidural fluid collection at the L3-4 level, measuring approximately 12 x 4 x 23 mm. It impresses upon the thecal sac and displaces the nerve roots of the cauda equina anteriorly and laterally to the right. There is enhancement of numerous nerve roots on the left. There is also a multilocular peripherally enhancing fluid collection in the left paraspinous musculature, measuring approximately 3.0 x 1.8 x 6.7 cm. There is increased T2 signal within the left facet joint, but no convincing evidence of osteomyelitis. Disc levels: There is mild disc space narrowing and mild disc bulging at L2-3 and L4-5, without significant spinal canal stenosis at either level. IMPRESSION: 1. There is a left-sided epidural fluid collection at the L3-4 level level impressing upon the thecal sac, compatible with epidural abscess/phlegmon. There is associated enhancement of several left nerve roots. 2. Large left multilocular paraspinous abscess. Electronically Signed: By: Evalene Coho M.D. On: 07/19/2023 18:10   DG Chest Port 1 View Result Date: 07/19/2023 CLINICAL DATA:  Questionable sepsis - evaluate for abnormality EXAM: PORTABLE CHEST - 1 VIEW COMPARISON:  None available. FINDINGS: No focal airspace consolidation, pleural effusion, or pneumothorax. Moderate cardiomegaly. Aortic atherosclerosis. No acute fracture or  destructive lesions. Multilevel thoracic osteophytosis. IMPRESSION: Cardiomegaly.  No pneumonia or pulmonary edema. Electronically Signed   By: Rogelia Myers M.D.   On: 07/19/2023 14:01   CT RENAL STONE STUDY Result Date: 07/17/2023 CLINICAL DATA:  Flank pain. EXAM: CT ABDOMEN AND PELVIS WITHOUT CONTRAST TECHNIQUE: Multidetector CT imaging of the abdomen and pelvis was performed following the standard protocol without IV contrast. RADIATION DOSE REDUCTION: This exam was performed according to the departmental dose-optimization program which includes automated exposure control, adjustment of the mA and/or kV according to patient size and/or use of iterative reconstruction technique. COMPARISON:  August 02, 2022. FINDINGS: Lower chest: Small pericardial effusion is noted. Hepatobiliary: Minimal cholelithiasis. No biliary dilatation is noted. Liver is unremarkable on these unenhanced images. Pancreas: Grossly stable 14 mm cystic lesion seen in pancreatic tail. No acute inflammation is noted. Spleen: Normal in size without focal abnormality. Adrenals/Urinary Tract: Adrenal glands appear normal. Bilateral renal atrophy is noted. No hydronephrosis or renal obstruction is noted. Urinary bladder is unremarkable. Stomach/Bowel: Stomach is unremarkable. The appendix appears normal. There is no evidence of bowel obstruction. Mild wall thickening with minimal surrounding inflammatory changes is seen involving the cecum. Vascular/Lymphatic: Aortic atherosclerosis. No enlarged abdominal or pelvic lymph nodes. Reproductive: Prostate is unremarkable. Other: No ascites or hernia is noted. Musculoskeletal: No acute or significant osseous findings. IMPRESSION: Small pericardial effusion. Minimal cholelithiasis. Grossly stable 14 mm cystic lesion seen in pancreatic tail. Recommend follow up pre and post contrast MRI/MRCP or pancreatic protocol CT in 2 years. This recommendation follows ACR consensus guidelines: Management of  Incidental Pancreatic Cysts: A White Paper of the ACR Incidental Findings Committee. J Am Coll Radiol 2017;14:911-923. Mild wall thickening with minimal surrounding inflammatory changes is seen involving the cecum suggesting infectious or inflammatory colitis. Bilateral renal atrophy is noted. Aortic Atherosclerosis (ICD10-I70.0). Electronically Signed   By: Lynwood Landy Raddle M.D.   On: 07/17/2023 12:27   PERIPHERAL VASCULAR CATHETERIZATION Result Date: 07/16/2023 Images from the original result were not included. Patient name: TEIGEN BELLIN MRN: [id number] DOB: 1950/11/07 Sex: male 07/16/2023  Pre-operative Diagnosis: Bleeding from left arm fistula Post-operative diagnosis:  Same Surgeon:  Malvina New Procedure Performed:  1.  Ultrasound-guided access, left cephalic vein  2.  Fistulogram  3.  Peripheral balloon venoplasty  4.  Conscious sedation, 12 minutes Indications: This is a 73 year old gentleman who was brought into the hospital for hemorrhagic shock likely secondary to bleeding from his dialysis cannulation sites.  He comes in today for further evaluation Procedure:  The patient was identified in the holding area and taken to room 8.  The patient was then placed supine on the table and prepped and draped in the usual sterile fashion.  A time out was called.  Conscious sedation was administered with the use of IV fentanyl  and Versed  under continuous physician and nurse monitoring.  Heart rate, blood pressure, and oxygen  saturations were continuously monitored.  Total sedation time was 12 minutes ultrasound was used to evaluate the fistula.  The vein was patent and compressible.  A digital ultrasound image was acquired.  The fistula was then accessed under ultrasound guidance using a micropuncture needle.  An 018 wire was then asvanced without resistance and a micropuncture sheath was placed.  Contrast injections were then performed through the sheath. Findings: No evidence of central venous stenosis.  The  arteriovenous anastomosis is widely patent.  Numerous overlapping stents are visualized in the distal fistula going through the cephalic vein arch.  At the proximal and distal stent edges, there was approximately a 50% stenosis.  This was not clinically visualized on the proximal stent however it was very difficult to get a wire to pass.  Intervention: After the above images were acquired the decision was made to proceed with intervention.  A 6 French sheath was placed over a Bentson wire.  I had some difficulty getting the Bentson wire through the proximal portion of the stents but was ultimately able to do so.  I then selected a 9 x 40 balloon and performed balloon venoplasty of the cephalic vein at the proximal and distal stent edges.  Completion imaging revealed no residual stenosis.  There is a small size mismatch between the stents and the vein.  Balloon and wire were removed.  A 4-0 Monocryl was placed around the sheath which was removed.  There were no immediate complications Impression:  #1  Successful balloon venoplasty of the proximal and distal stent edge stenosis using a 9 mm balloon  #2  Fistula remains amenable to future interventions V. Malvina New, M.D., FACS [facility] Office: [phone] Pager:  [phone]   ECHOCARDIOGRAM COMPLETE Result Date: 07/12/2023    ECHOCARDIOGRAM REPORT   Patient Name:   ZEDEKIAH HINDERMAN Date of Exam: 07/11/2023 Medical Rec #:  [id number]     Height:       68.0 in Accession #:    [id number]    Weight:       173.8 lb Date of Birth:  1950-12-27      BSA:          1.925 m Patient Age:    73 years      BP:           140/58 mmHg Patient Gender: M             HR:           62 bpm. Exam Location:  [place] Procedure: 2D Echo, Cardiac Doppler, Color Doppler and Strain Analysis (Both  Spectral and Color Flow Doppler were utilized during procedure). Indications:    Dyspnea on exertion [R06.09 (ICD-10-CM)]  History:        Patient has  prior history of Echocardiogram examinations, most                 recent 08/30/2022. CHF, CAD, Aortic Valve Disease; Risk                 Factors:Diabetes, Dyslipidemia and Hypertension. End-stage                 kidney disease on dialysis.  Sonographer:    Charlie Jointer RDCS Referring Phys: [id number] [name] [name] IMPRESSIONS  1. Left ventricular ejection fraction, by estimation, is 60 to 65%. The left ventricle has normal function. The left ventricle has no regional wall motion abnormalities. There is severe left ventricular hypertrophy. Left ventricular diastolic parameters  are consistent with Grade II diastolic dysfunction (pseudonormalization).  2. Right ventricular systolic function is normal. The right ventricular size is mildly enlarged.  3. Left atrial size was severely dilated.  4. A small pericardial effusion is present. The pericardial effusion is circumferential. There is no evidence of cardiac tamponade.  5. The mitral valve is degenerative. Mild mitral valve regurgitation. No evidence of mitral stenosis.  6. The aortic valve is tricuspid. There is moderate calcification of the aortic valve. Aortic valve regurgitation is mild. Moderate to severe aortic valve stenosis. Aortic valve area, by VTI measures 0.79 cm. Aortic valve mean gradient measures 23.8 mmHg. Aortic valve Vmax measures 3.21 m/s.  7. The inferior vena cava is normal in size with greater than 50% respiratory variability, suggesting right atrial pressure of 3 mmHg. Comparison(s): In comparison prior TTE 8/8/82024 LVEF 60-65%, grade 1DD, trace MR, AS-paradoxical low gradient severe, small-moderate pericardial effusion. FINDINGS  Left Ventricle: Left ventricular ejection fraction, by estimation, is 60 to 65%. The left ventricle has normal function. The left ventricle has no regional wall motion abnormalities. Global longitudinal strain performed but not reported based on interpreter judgement due to suboptimal tracking. The left  ventricular internal cavity size was normal in size. There is severe left ventricular hypertrophy. Left ventricular diastolic parameters are consistent with Grade II diastolic dysfunction (pseudonormalization). Right Ventricle: The right ventricular size is mildly enlarged. Right vetricular wall thickness was not well visualized. Right ventricular systolic function is normal. Left Atrium: Left atrial size was severely dilated. Right Atrium: Right atrial size was normal in size. Pericardium: A small pericardial effusion is present. The pericardial effusion is circumferential. There is no evidence of cardiac tamponade. Mitral Valve: The mitral valve is degenerative in appearance. Mild mitral annular calcification. Mild mitral valve regurgitation. No evidence of mitral valve stenosis. Tricuspid Valve: The tricuspid valve is not well visualized. Tricuspid valve regurgitation is mild . No evidence of tricuspid stenosis. Aortic Valve: The aortic valve is tricuspid. There is moderate calcification of the aortic valve. Aortic valve regurgitation is mild. Moderate to severe aortic stenosis is present. Aortic valve mean gradient measures 23.8 mmHg. Aortic valve peak gradient  measures 41.3 mmHg. Aortic valve area, by VTI measures 0.79 cm. Pulmonic Valve: The pulmonic valve was grossly normal. Pulmonic valve regurgitation is trivial. No evidence of pulmonic stenosis. Aorta: The aortic root and ascending aorta are structurally normal, with no evidence of dilitation. Venous: The inferior vena cava is normal in size with greater than 50% respiratory variability, suggesting right atrial pressure of 3 mmHg. IAS/Shunts: The interatrial septum was not well visualized.  LEFT  VENTRICLE PLAX 2D LVIDd:         4.70 cm   Diastology LVIDs:         3.10 cm   LV e' medial:    4.68 cm/s LV PW:         1.60 cm   LV E/e' medial:  15.8 LV IVS:        1.70 cm   LV e' lateral:   6.60 cm/s LVOT diam:     2.10 cm   LV E/e' lateral: 11.2 LV SV:          63 LV SV Index:   33 LVOT Area:     3.46 cm  RIGHT VENTRICLE             IVC RV Basal diam:  4.10 cm     IVC diam: 2.00 cm RV Mid diam:    3.30 cm RV S prime:     12.43 cm/s TAPSE (M-mode): 2.3 cm LEFT ATRIUM            Index        RIGHT ATRIUM           Index LA diam:      4.50 cm  2.34 cm/m   RA Area:     14.80 cm LA Vol (A2C): 106.0 ml 55.06 ml/m  RA Volume:   37.50 ml  19.48 ml/m LA Vol (A4C): 102.0 ml 52.98 ml/m  AORTIC VALVE AV Area (Vmax):    0.86 cm AV Area (Vmean):   0.77 cm AV Area (VTI):     0.79 cm AV Vmax:           321.40 cm/s AV Vmean:          229.200 cm/s AV VTI:            0.792 m AV Peak Grad:      41.3 mmHg AV Mean Grad:      23.8 mmHg LVOT Vmax:         79.93 cm/s LVOT Vmean:        50.800 cm/s LVOT VTI:          0.182 m LVOT/AV VTI ratio: 0.23  AORTA Ao Root diam: 3.70 cm Ao Asc diam:  3.60 cm Ao Desc diam: 2.10 cm MITRAL VALVE MV Area (PHT): 3.78 cm    SHUNTS MV Decel Time: 201 msec    Systemic VTI:  0.18 m MR Peak grad: 48.2 mmHg    Systemic Diam: 2.10 cm MR Vmax:      347.00 cm/s MV E velocity: 73.90 cm/s MV A velocity: 73.55 cm/s MV E/A ratio:  1.00 Sreedhar reddy Madireddy Electronically signed by Alean reddy Madireddy Signature Date/Time: 07/12/2023/8:26:22 PM    Final     Labs:  CBC: Recent Labs    07/17/23 0420 07/18/23 0846 07/19/23 1134 07/20/23 0409  WBC 10.9* 9.3 12.4* 10.6*  HGB 9.3* 9.8* 9.0* 7.9*  HCT 28.8* 29.5* 26.8* 23.6*  PLT 235 264 298 294    COAGS: Recent Labs    02/16/23 1813 07/13/23 2308 07/19/23 1134  INR 1.1 1.6* 1.3*  APTT 32 40*  --     BMP: Recent Labs    07/17/23 0420 07/18/23 0846 07/19/23 1134 07/20/23 0409  NA 132* 131* 129* 129*  K 4.4 3.6 4.1 4.0  CL 91* 90* 89* 90*  CO2 22 28 23 23   GLUCOSE 121* 173* 153* 141*  BUN 40* 23 34* 41*  CALCIUM  8.3* 8.5*  8.1* 7.6*  CREATININE 8.54* 6.33* 8.49* 9.64*  GFRNONAA 6* 9* 6* 5*    LIVER FUNCTION TESTS: Recent Labs    07/13/23 2308 07/14/23 0259  07/18/23 0846 07/19/23 1134  BILITOT 0.7 0.6  --  0.7  AST 122* 145*  --  49*  ALT 121* 135*  --  81*  ALKPHOS 69 61  --  88  PROT 5.2* 4.9*  --  6.6  ALBUMIN  2.4* 2.2* 2.7* 2.7*    TUMOR MARKERS: No results for input(s): AFPTM, CEA, CA199, CHROMGRNA in the last 8760 hours.  Assessment and Plan:  Request for  image guided L paraspinous fluid collection aspiration approved by Dr. Johann for 7/18 schedule allowing. Procedure may be pushed to 7/19 since patient ate food brought by family after NPO order placed. No contraindications for procedure identified in ROS, physical exam, or review of pre-sedation considerations. Labs reviewed and within acceptable range 7/17 MR lumbar imaging available and reviewed VSS, afebrile Patient not asked to hold any AC/AP for this low bleeding risk procedure Abx to be managed by ID      Risks and benefits of aspiration were discussed with the patient including bleeding, infection, damage to adjacent structures, and sepsis.  All of the patient's questions were answered, patient is agreeable to proceed. Consent signed and in chart.   Thank you for allowing our service to participate in AMY BELLOSO 's care.    Electronically Signed: Laymon Coast, NP   07/20/2023, 11:09 AM     I spent a total of 20 Minutes    in face to face in clinical consultation, greater than 50% of which was counseling/coordinating care for image guided aspiration of paraspinal fluid collection.    (A copy of this note was sent to the referring provider and the time of visit.)

## 2023-07-20 NOTE — Consult Note (Addendum)
 [facility] Renal Consultation Note    Indication for Consultation:  Management of ESRD/hemodialysis, anemia, hypertension/volume, and secondary hyperparathyroidism.  HPI: Justin [name] is a 73 y.o. male with ESRD on HD TTS, T2DM, HTN, HL who was admitted with bacteremia.  S/p recent admit 7/11-7/16/25 with hemorrhagic shock after AVF bleed. He underwent fistulogram/plasty of the fistula and was transfused with 2U PRBCs. Towards the end of his admit, he c/o L flank pain - underwent abd CT which did not show acute acute findings. After discharge, 1 of 4 blood Cx which were collected 7/11 turned positive - growing MSSA. He was called to come back to the ED.  In the ED, he was afebrile and non-toxic appearing. Labs showed Na 129, K 4.1, BUN 34, Ca 8.1, Alb 2.7, WBC 12.4, Hgb 9, Plts 298, LA 0.9. CXR clear. Due to persistent L flank pain, he underwent LS MRI which showed L sided epidural fluid collection/phlegmon at the L3/4 area and large L multilocular paraspinal abscess. Neurosurgery consulted -> recommending IR aspiration of the paraspinal abscess at this time.   Seen in room this morning - back pain is only intermittently - doesn't feel bad considering. No CP, dyspnea. No fever, chills. No abd pain, N/V/D.  Dialyzes on TTS schedule at [place] unit. Last HD was at [facility] on Tuesday 7/15 - he did not make it to HD yesterday due to the above. His L AVF has been able to be used for dialysis.  Past Medical History:  Diagnosis Date   BPH (benign prostatic hyperplasia)    Diabetes (HCC)    Dysuria    Erectile dysfunction    ESRD on hemodialysis (HCC)    History of chronic CHF 11/19/2019   Hyperlipemia    Hypertension    Personal history of COVID-19 11/19/2019   Pinna disorder, left    Wears glasses    Past Surgical History:  Procedure Laterality Date   A/V FISTULAGRAM Left 07/16/2023   Procedure: A/V Fistulagram;  Surgeon: Serene Gaile LELON, MD;  Location: [facility] INVASIVE CV LAB;   Service: Cardiovascular;  Laterality: Left;   A/V SHUNT INTERVENTION N/A 04/16/2023   Procedure: A/V SHUNT INTERVENTION;  Surgeon: Norine Manuelita LABOR, MD;  Location: [facility] INVASIVE CV LAB;  Service: Cardiovascular;  Laterality: N/A;   AV FISTULA PLACEMENT Left 04/30/2018   Procedure: CREATION OF RADIOCEPHALIC ARTERIOVENOUS FISTULA LEFT ARM;  Surgeon: Harvey Carlin BRAVO, MD;  Location: [facility] OR;  Service: Vascular;  Laterality: Left;   AV FISTULA PLACEMENT Left 08/28/2018   Procedure: ARTERIOVENOUS (AV) BRACHIOCEPHALIC FISTULA CREATION LEFT UPPER ARM;  Surgeon: Gretta Lonni PARAS, MD;  Location: [facility] OR;  Service: Vascular;  Laterality: Left;   BIOPSY  02/24/2023   Procedure: BIOPSY;  Surgeon: Federico Rosario BROCKS, MD;  Location: [facility] ENDOSCOPY;  Service: Gastroenterology;;   ESOPHAGOGASTRODUODENOSCOPY (EGD) WITH PROPOFOL  N/A 02/24/2023   Procedure: ESOPHAGOGASTRODUODENOSCOPY (EGD) WITH PROPOFOL ;  Surgeon: Federico Rosario BROCKS, MD;  Location: [facility] ENDOSCOPY;  Service: Gastroenterology;  Laterality: N/A;  Patint is s/p right hip ORIF   INTRAMEDULLARY (IM) NAIL INTERTROCHANTERIC Right 02/17/2023   Procedure: INTRAMEDULLARY (IM) NAIL INTERTROCHANTERIC;  Surgeon: Sherida Adine BROCKS, MD;  Location: [facility] OR;  Service: Orthopedics;  Laterality: Right;   IR FLUORO GUIDE CV LINE RIGHT  04/29/2018   IR US  GUIDE VASC ACCESS RIGHT  04/29/2018   RIGHT/LEFT HEART CATH AND CORONARY ANGIOGRAPHY N/A 05/01/2018   Procedure: RIGHT/LEFT HEART CATH AND CORONARY ANGIOGRAPHY;  Surgeon: Claudene Victory LELON, MD;  Location: [facility] INVASIVE CV  LAB;  Service: Cardiovascular;  Laterality: N/A;   RIGHT/LEFT HEART CATH AND CORONARY ANGIOGRAPHY N/A 09/29/2022   Procedure: RIGHT/LEFT HEART CATH AND CORONARY ANGIOGRAPHY;  Surgeon: Wonda Sharper, MD;  Location: [facility] INVASIVE CV LAB;  Service: Cardiovascular;  Laterality: N/A;   subdural hematoma Right 07/2020   Family History  Problem Relation Age of Onset   Stroke Mother    Diabetes Father    Social History:   reports that he has never smoked. He has never used smokeless tobacco. He reports that he does not currently use alcohol . He reports that he does not use drugs.  ROS: As per HPI otherwise negative.  Physical Exam: Vitals:   07/20/23 0145 07/20/23 0200 07/20/23 0355 07/20/23 0909  BP:  (!) 134/56 (!) 134/56 (!) 144/51  Pulse: 71 70 65 62  Resp:  19 15 19   Temp:   98.6 F (37 C) 98.1 F (36.7 C)  TempSrc:   Oral Oral  SpO2: 99% 99% 100% 98%     General: Well developed, well nourished, in no acute distress. Room air Head: Normocephalic, atraumatic, sclera non-icteric. Neck: Supple without lymphadenopathy/masses. JVD not elevated. Lungs: Clear bilaterally to auscultation without wheezes, rales, or rhonchi. Breathing is unlabored. Heart: RRR; 3/6 systolic murmur Abdomen: Soft, non-tender, non-distended with normoactive bowel sounds. No rebound/guarding.  Musculoskeletal:  Strength and tone appear normal for age. Lower extremities: No edema or ischemic changes, no open wounds. Neuro: Alert and oriented X 3. Moves all extremities spontaneously. Psych:  Responds to questions appropriately with a normal affect. Dialysis Access: LUE AVF + t/b, still with scabbed area  No Known Allergies Prior to Admission medications   Medication Sig Start Date End Date Taking? Authorizing Provider  acetaminophen  (TYLENOL ) 500 MG tablet Take 2 tablets (1,000 mg total) by mouth every 6 (six) hours as needed for mild pain (pain score 1-3), fever or headache. 02/23/23  Yes Laurence Locus, DO  amLODipine  (NORVASC ) 5 MG tablet Take 5 mg by mouth daily.   Yes [provider]  aspirin  EC 81 MG tablet Take 81 mg by mouth daily. Swallow whole.   Yes [provider]  cyanocobalamin  (VITAMIN B12) 1000 MCG tablet Take 1,000 mcg by mouth daily.   Yes [provider]  ferric citrate  (AURYXIA ) 1 GM 210 MG(Fe) tablet Take 630 mg by mouth 3 (three) times daily with meals.   Yes [provider]   hydrALAZINE  (APRESOLINE ) 25 MG tablet Take 1 tablet (25 mg total) by mouth 3 (three) times daily. Take 50 mg 3 times daily on M, W, F(dialysis days) Take 50 mg twice daily on Tu, Th, Sat, Sun(non-dialysis days) Patient taking differently: Take 50-150 mg by mouth See admin instructions. Take two tablets (50 mg) 2 times daily on M, W, Friday Sun(non dialysis days) Take 150 mg twice daily on Tu, Th, Sat,(dialysis days) 07/18/23  Yes Rashid, Farhan, MD  insulin  aspart protamine  - aspart (NOVOLOG  70/30 FLEXPEN) (70-30) 100 UNIT/ML FlexPen Inject 20 Units into the skin at bedtime.   Yes [provider]  isosorbide  mononitrate (IMDUR ) 30 MG 24 hr tablet Take 1 tablet (30 mg total) by mouth 4 (four) times a week. Mon, Wed, Fri, Sun (non-dialysis days) 10/21/22  Yes [name], [name], MD  methocarbamol  (ROBAXIN ) 500 MG tablet Take 1 tablet (500 mg total) by mouth every 8 (eight) hours as needed for muscle spasms. 07/18/23  Yes Rashid, Farhan, MD  metoprolol  succinate (TOPROL -XL) 50 MG 24 hr tablet Take 50 mg by mouth  daily.   Yes [provider]  ondansetron  (ZOFRAN -ODT) 4 MG disintegrating tablet Take 1 tablet by mouth. 07/12/23  Yes [provider]  oxyCODONE -acetaminophen  (PERCOCET/ROXICET) 5-325 MG tablet Take 1 tablet by mouth every 8 (eight) hours as needed for severe pain (pain score 7-10). 07/18/23  Yes Rashid, Farhan, MD  rosuvastatin  (CRESTOR ) 40 MG tablet Take 40 mg by mouth daily. 01/22/22  Yes [provider]  amLODipine  (NORVASC ) 10 MG tablet Take 0.5 tablets (5 mg total) by mouth daily. Patient not taking: Reported on 07/19/2023 07/18/23   Rashid, Farhan, MD  nitroGLYCERIN  (NITROSTAT ) 0.4 MG SL tablet Place 0.4 mg under the tongue every 5 (five) minutes as needed for chest pain.    [provider]   Current Facility-Administered Medications  Medication Dose Route Frequency Provider Last Rate Last Admin   acetaminophen  (TYLENOL ) tablet 1,000 mg  1,000 mg  Oral TID Georgina Basket, MD   1,000 mg at 07/19/23 2143   amLODipine  (NORVASC ) tablet 5 mg  5 mg Oral Daily Georgina Basket, MD   5 mg at 07/19/23 1809   aspirin  EC tablet 81 mg  81 mg Oral Daily Georgina Basket, MD   81 mg at 07/19/23 1809   ceFAZolin  (ANCEF ) IVPB 1 g/50 mL premix  1 g Intravenous Q24H Dennise Kingsley, MD       heparin  injection 5,000 Units  5,000 Units Subcutaneous Q8H Georgina Basket, MD   5,000 Units at 07/20/23 0505   insulin  aspart (novoLOG ) injection 0-15 Units  0-15 Units Subcutaneous TID WC Georgina Basket, MD   2 Units at 07/19/23 1811   insulin  glargine-yfgn (SEMGLEE ) injection 20 Units  20 Units Subcutaneous QHS Georgina Basket, MD   20 Units at 07/19/23 2208   isosorbide  mononitrate (IMDUR ) 24 hr tablet 30 mg  30 mg Oral Once per day on Sunday Tuesday Thursday Saturday Georgina Basket, MD   30 mg at 07/19/23 1810   methocarbamol  (ROBAXIN ) tablet 500 mg  500 mg Oral TID Georgina Basket, MD   500 mg at 07/19/23 2143   metoprolol  succinate (TOPROL -XL) 24 hr tablet 50 mg  50 mg Oral Daily Georgina Basket, MD   50 mg at 07/19/23 1810   morphine  (PF) 2 MG/ML injection 4 mg  4 mg Intravenous Once Kommor, Madison, MD       ondansetron  (ZOFRAN ) injection 4 mg  4 mg Intravenous Once Kommor, Madison, MD       oxyCODONE  (Oxy IR/ROXICODONE ) immediate release tablet 5 mg  5 mg Oral Q4H PRN Georgina Basket, MD   5 mg at 07/20/23 0557   rosuvastatin  (CRESTOR ) tablet 40 mg  40 mg Oral Daily Georgina Basket, MD   40 mg at 07/19/23 1810   Labs: Basic Metabolic Panel: Recent Labs  Lab 07/14/23 0259 07/15/23 0322 07/17/23 0420 07/18/23 0846 07/19/23 1134 07/20/23 0409  NA 134* 132*   < > 131* 129* 129*  K 3.8 3.9   < > 3.6 4.1 4.0  CL 92* 90*   < > 90* 89* 90*  CO2 30 25   < > 28 23 23   GLUCOSE 274* 132*   < > 173* 153* 141*  BUN 58* 66*   < > 23 34* 41*  CREATININE 10.38* 11.76*   < > 6.33* 8.49* 9.64*  CALCIUM  7.5* 6.9*   < > 8.5* 8.1* 7.6*  PHOS 7.3* 7.3*  --  4.9*  --   --    < > = values in  this interval not  displayed.   Liver Function Tests: Recent Labs  Lab 07/13/23 2308 07/14/23 0259 07/18/23 0846 07/19/23 1134  AST 122* 145*  --  49*  ALT 121* 135*  --  81*  ALKPHOS 69 61  --  88  BILITOT 0.7 0.6  --  0.7  PROT 5.2* 4.9*  --  6.6  ALBUMIN  2.4* 2.2* 2.7* 2.7*   CBC: Recent Labs  Lab 07/16/23 1032 07/17/23 0420 07/18/23 0846 07/19/23 1134 07/20/23 0409  WBC 10.3 10.9* 9.3 12.4* 10.6*  NEUTROABS  --  9.0*  --  10.3*  --   HGB 8.8* 9.3* 9.8* 9.0* 7.9*  HCT 25.7* 28.8* 29.5* 26.8* 23.6*  MCV 85.7 89.7 87.0 88.2 86.4  PLT 204 235 264 298 294   Studies/Results: US  LT UPPER EXTREM LTD SOFT TISSUE NON VASCULAR Result Date: 07/20/2023 CLINICAL DATA:  Infection at AV fistula site. EXAM: ULTRASOUND left UPPER EXTREMITY LIMITED TECHNIQUE: Ultrasound examination of the upper extremity soft tissues was performed in the area of clinical concern. COMPARISON:  None Available. FINDINGS: AV fistula is visualized. No soft tissue edema or organized fluid collection. IMPRESSION: No soft tissue edema or organized fluid collection to suggest abscess. Electronically Signed   By: Newell Eke M.D.   On: 07/20/2023 09:15   MR Lumbar Spine W Wo Contrast Addendum Date: 07/19/2023 ADDENDUM REPORT: 07/19/2023 18:25 ADDENDUM: These results were called by telephone at the time of interpretation on 07/19/2023 at 6:24 pm to provider Dr. Marsha Ada, who verbally acknowledged these results. Electronically Signed   By: Evalene Coho M.D.   On: 07/19/2023 18:25   Result Date: 07/19/2023 CLINICAL DATA:  Myelopathy, acute, lumbar spine concern for epidural abscess EXAM: MRI LUMBAR SPINE WITHOUT AND WITH CONTRAST TECHNIQUE: Multiplanar and multiecho pulse sequences of the lumbar spine were obtained without and with intravenous contrast. CONTRAST:  7mL GADAVIST  GADOBUTROL  1 MMOL/ML IV SOLN COMPARISON:  None Available. FINDINGS: Segmentation:  Standard. Alignment:  Physiologic. Vertebrae:  Vertebral bodies are diffusely heterogeneous in signal intensity. There are discogenic reactive changes also present within the endplates at L2-3 and L3-4. Conus medullaris and cauda equina: Conus extends to the T12-L1 level. The conus appears normal. There is central crowding of the nerve roots at L3-4 secondary to an epidural fluid collection. Paraspinal and other soft tissues: There is a left posterolateral epidural fluid collection at the L3-4 level, measuring approximately 12 x 4 x 23 mm. It impresses upon the thecal sac and displaces the nerve roots of the cauda equina anteriorly and laterally to the right. There is enhancement of numerous nerve roots on the left. There is also a multilocular peripherally enhancing fluid collection in the left paraspinous musculature, measuring approximately 3.0 x 1.8 x 6.7 cm. There is increased T2 signal within the left facet joint, but no convincing evidence of osteomyelitis. Disc levels: There is mild disc space narrowing and mild disc bulging at L2-3 and L4-5, without significant spinal canal stenosis at either level. IMPRESSION: 1. There is a left-sided epidural fluid collection at the L3-4 level level impressing upon the thecal sac, compatible with epidural abscess/phlegmon. There is associated enhancement of several left nerve roots. 2. Large left multilocular paraspinous abscess. Electronically Signed: By: Evalene Coho M.D. On: 07/19/2023 18:10   DG Chest Port 1 View Result Date: 07/19/2023 CLINICAL DATA:  Questionable sepsis - evaluate for abnormality EXAM: PORTABLE CHEST - 1 VIEW COMPARISON:  None available. FINDINGS: No focal airspace consolidation, pleural effusion, or pneumothorax. Moderate cardiomegaly. Aortic atherosclerosis. No acute  fracture or destructive lesions. Multilevel thoracic osteophytosis. IMPRESSION: Cardiomegaly.  No pneumonia or pulmonary edema. Electronically Signed   By: Rogelia Myers M.D.   On: 07/19/2023 14:01   Dialysis Orders:   TTS - [place] 4 hours, 450/A1.5, EDW 76kg, 2K/2.5Ca bath, AVF, no heparin  - Mircera 150mcg IV q 2 weeks (ordered, not get given) - last got Aranesp  on 7/13 - Hectoral 1mcg IV q HD  Assessment/Plan:  MSSA bacteremia with associated LS epidural phlegmon and paraspinal abscess: Started on Cefazolin . IR consulted by Nsg for possible aspiration. ID involved. Echo ordered/pending - he does have a murmur. AVF US  without abscess.  ESRD:  Usual TTS schedule - next HD tomorrow, using AVF.  Hypertension/volume: BP up slightly, UF as tolerated.  Anemia: Hgb down to 7.9 - resume ESA q Sunday this admit.  Metabolic bone disease: CorrCa/Phos ok - continue home meds.  Nutrition:  Alb low, adding protein supplements. T2DM  Izetta Boehringer, PA-C 07/20/2023, 11:12 AM  [facility]    Seen and examined independently.  Agree with note and exam as documented above by physician extender and as noted here.  Mr. Crisco is a gentleman with ESRD on hemodialysis Tuesday Thursday Saturday who had presented to the hospital recently with hemorrhagic shock attributed to bleeding fistula.  S/p fistulogram and angioplasty with VVS on 7/14.  He reported back pain during the hospitalization that was worked up with CT stone study which was largely unremarkable.  Discharged on 7/16.  He was called back on 7/17 because of positive blood cultures from his recent hospitalization.  He presented back to the ER as instructed and he was found to have an epidural fluid collection.  Neurosurgery consulted and recommended IR drainage of this.  Cardiology was consulted for a TEE in light of the bacteremia however per charting they recommend GI work-up with repeat upper endoscopy before a TEE.  (Note upper endoscopy 02/24/23 with grade D esophagitis and focal necrosis per their note).  This afternoon, he unfortunately accidentally scratched a scab off of his AVF and he had another hemorrhage event on 63M (appreciate all  staff involved in swiftly caring for the patient).  Vascular surgery placed a stitch.   General adult male in bed uncomfortable secondary to pain  HEENT normocephalic atraumatic extraocular movements intact sclera anicteric Neck supple trachea midline Lungs clear to auscultation bilaterally normal work of breathing at rest on room air Heart S1S2 no rub Abdomen soft nontender nondistended Extremities no edema  Psych normal mood and affect Neuro - alert and oriented x 3 provides hx and follows commands Access LUE AVF with bruit and thrill; wrapped   # Epidural abscess  - Neurosurgery was consulted - Status post drainage of fluid collection and culture with IR today - Antibiotics per primary team  # MSSA bacteremia - Antibiotics per primary team - Note cardiology was consulted to arrange for a TEE.  # ESRD  - on HD TTS   # hemorrhage of left AVF - this is the second event in a week  - s/p stitch with VVS and s/p fistulogram earlier this week   # HTN  - Acceptable control  - optimize volume with HD  # Anemia of CKD  - acute blood loss as well  - on aranesp  200 mcg weekly on Sundays; iron  sats high  - PRBC's per primary team    # Metabolic bone disease  - continue home regimen   Thank you for the consult.  Please  do not hesitate to contact us  with any questions    Katheryn JAYSON Saba, MD 07/20/2023  8:37 PM

## 2023-07-20 NOTE — Progress Notes (Signed)
 PHARMACY - PHYSICIAN COMMUNICATION CRITICAL VALUE ALERT - BLOOD CULTURE IDENTIFICATION (BCID)  Justin [name] is an 73 y.o. male who presented to [facility] on 07/19/2023 with a chief complaint of MSSA bacteremia  Assessment:  Pt with 1/2 blood culture bottles showing GPC. Pt with blood cx 7/11 showing MSSA so no new BCID will be run.  Name of physician (or Provider) Contacted: Dr. Keturah  Current antibiotics: Ancef   Changes to prescribed antibiotics recommended:  Patient is on recommended antibiotics - No changes needed  Results for orders placed or performed during the hospital encounter of 07/13/23  Blood Culture ID Panel (Reflexed) (Collected: 07/13/2023 11:08 PM)  Result Value Ref Range   Enterococcus faecalis NOT DETECTED NOT DETECTED   Enterococcus Faecium NOT DETECTED NOT DETECTED   Listeria monocytogenes NOT DETECTED NOT DETECTED   Staphylococcus species DETECTED (A) NOT DETECTED   Staphylococcus aureus (BCID) DETECTED (A) NOT DETECTED   Staphylococcus epidermidis NOT DETECTED NOT DETECTED   Staphylococcus lugdunensis NOT DETECTED NOT DETECTED   Streptococcus species NOT DETECTED NOT DETECTED   Streptococcus agalactiae NOT DETECTED NOT DETECTED   Streptococcus pneumoniae NOT DETECTED NOT DETECTED   Streptococcus pyogenes NOT DETECTED NOT DETECTED   A.calcoaceticus-baumannii NOT DETECTED NOT DETECTED   Bacteroides fragilis NOT DETECTED NOT DETECTED   Enterobacterales NOT DETECTED NOT DETECTED   Enterobacter cloacae complex NOT DETECTED NOT DETECTED   Escherichia coli NOT DETECTED NOT DETECTED   Klebsiella aerogenes NOT DETECTED NOT DETECTED   Klebsiella oxytoca NOT DETECTED NOT DETECTED   Klebsiella pneumoniae NOT DETECTED NOT DETECTED   Proteus species NOT DETECTED NOT DETECTED   Salmonella species NOT DETECTED NOT DETECTED   Serratia marcescens NOT DETECTED NOT DETECTED   Haemophilus influenzae NOT DETECTED NOT DETECTED   Neisseria meningitidis NOT DETECTED NOT  DETECTED   Pseudomonas aeruginosa NOT DETECTED NOT DETECTED   Stenotrophomonas maltophilia NOT DETECTED NOT DETECTED   Candida albicans NOT DETECTED NOT DETECTED   Candida auris NOT DETECTED NOT DETECTED   Candida glabrata NOT DETECTED NOT DETECTED   Candida krusei NOT DETECTED NOT DETECTED   Candida parapsilosis NOT DETECTED NOT DETECTED   Candida tropicalis NOT DETECTED NOT DETECTED   Cryptococcus neoformans/gattii NOT DETECTED NOT DETECTED   Meth resistant mecA/C and MREJ NOT DETECTED NOT DETECTED    Vito Ralph, PharmD, BCPS Please see amion for complete clinical pharmacist phone list 07/20/2023  5:16 AM

## 2023-07-20 NOTE — ED Notes (Signed)
Pt resting and calm

## 2023-07-20 NOTE — ED Notes (Signed)
 Pt called out sts he's lit up. Pt reports feeling uncomfortable and restless. Can't quiet get comfortable. Pt repositioned. Sheets straightened out in bed. Offered ice chips and ice water . Denies any additional needs at this time. Light off. Call bell within reach.

## 2023-07-20 NOTE — Progress Notes (Signed)
 Progress Note   Patient: Justin [name] FMW:[id number] DOB: 12/13/1950 DOA: 07/19/2023     1 DOS: the patient was seen and examined on 07/20/2023   Brief hospital course: 73 year old man with PMH of ESRD on HD TTS, HFpEF, CAD, AAS, HTN, HLD, T2DM and recent admission from 7/11-7/16 for hemorrhaghic shock 2/2 bleeding fistula during HD who underwent fistulogram and angioplasty per IR on 7/14.  Patient was called back to the hospital the day after discharge due to positive blood cultures with MSSA growing from cultures drawn on 7/11 (prior admission).   Further evaluation has revealed a left-sided epidural fluid collection at the L3-L4 level.  Neurosurgery and interventional radiology consulted. Infectious disease, nephrology, vascular surgery also on consult.  Assessment and Plan:  Epidural abscess MSSA bacteremia Patient called back to hospital due to positive blood cultures. MRI of lumbar spine revealed epidural abscess. Infectious disease consulted.  Input appreciated. Neurosurgery consulted for drainage of abscess.  Given no neurological complications, IR consult recommended for aspiration. IR has been consulted.  Patient awaiting aspiration of collection. - Continue cefazolin . - Repeat blood cultures. - Will follow-up cultures from spinal aspirate   ESRD on HD Patient on TTS hemodialysis schedule. He reports his last hemodialysis was on 7/16 while he was admitted. Nephrology consulted.  Input appreciated. - Continue with hemodialysis per nephrology.  Recent admission from bleeding fistula. S/p fistulogram and angioplasty on 7/14. Patient had some bleeding from his AV fistula on 7/18.  Addressed by vascular surgery. - Will continue to monitor.  HTN - Continue home Norvasc , Imdur , metoprolol .  T2DM, controlled A1c was 5.3 on 7/17 - Diabetic diet. - Long-acting, Premeal and sliding scale insulin .  Anemia Likely anemia of chronic disease. - Will monitor hemoglobin. -  Transfuse if hemoglobin less than 7.  Hyponatremia Patient appears to be hyponatremic at baseline. - Will monitor sodium levels.      Subjective: Patient states he has intermittent lower back pain, but nothing too severe.  He denies lower extremity weakness.  He has no difficulty voiding or passing stool.  Physical Exam: Vitals:   07/20/23 0145 07/20/23 0200 07/20/23 0355 07/20/23 0909  BP:  (!) 134/56 (!) 134/56 (!) 144/51  Pulse: 71 70 65 62  Resp:  19 15 19   Temp:   98.6 F (37 C) 98.1 F (36.7 C)  TempSrc:   Oral Oral  SpO2: 99% 99% 100% 98%   Physical Exam   General: Alert, oriented X3  Eyes: Pupils equal, reactive  Oral cavity: moist mucous membranes  Head: Atraumatic, normocephalic  Neck: supple  Chest: clear to auscultation. No crackles, no wheezes  CVS: S1,S2 RRR.  Systolic murmur + Abd: No distention, soft, non-tender. No masses palpable  Extr: No edema   MSK: No joint deformities or swelling  Neurological: Grossly intact.    Data Reviewed:     Latest Ref Rng & Units 07/20/2023    4:09 AM 07/19/2023   11:34 AM 07/18/2023    8:46 AM  CBC  WBC 4.0 - 10.5 K/uL 10.6  12.4  9.3   Hemoglobin 13.0 - 17.0 g/dL 7.9  9.0  9.8   Hematocrit 39.0 - 52.0 % 23.6  26.8  29.5   Platelets 150 - 400 K/uL 294  298  264       Latest Ref Rng & Units 07/20/2023    4:09 AM 07/19/2023   11:34 AM 07/18/2023    8:46 AM  BMP  Glucose 70 - 99 mg/dL 858  153  173   BUN 8 - 23 mg/dL 41  34  23   Creatinine 0.61 - 1.24 mg/dL 0.35  1.50  3.66   Sodium 135 - 145 mmol/L 129  129  131   Potassium 3.5 - 5.1 mmol/L 4.0  4.1  3.6   Chloride 98 - 111 mmol/L 90  89  90   CO2 22 - 32 mmol/L 23  23  28    Calcium  8.9 - 10.3 mg/dL 7.6  8.1  8.5      Family Communication: Discussed with the patient  Disposition: Status is: Inpatient Remains inpatient appropriate because: On IV antibiotics for MSSA bacteremia, epidural abscess  Planned Discharge Destination: Home    Time spent: 50  minutes  Author: MDALA-GAUSI, GOLDEN PILLOW, MD 07/20/2023 2:57 PM  For on call review [email or web address].

## 2023-07-20 NOTE — Consult Note (Signed)
 [facility]    Date of Admission:  07/19/2023   Total days of inpatient antibiotics 0        Reason for Consult: MSSA bacteremia    Principal Problem:   Bacteremia due to methicillin susceptible Staphylococcus aureus (MSSA)   Assessment: 73 year old male with past medical history of ESRD on HD via left AVF complicated by recent admission for hemorrhagic shock secondary to bleeding fistula during HD underwent fistulogram and angioplasty with IR on 7/14 called back as blood cultures from last admission grew MSSA. #MSSA bacteremia likely secondary to left AVF #ESRD on HD #CAD #DM -1/4 bottles MSSA positive on day 5 -Pt afebrile wbc 12k on admssion Recommendations:  -Repeat blood Cx -start cefazolin . Discussed with ED team -TTE, will need TTE -LUE US -> I think we may end up getting CT -Standard precautions  Evaluation of this patient requires complex antimicrobial therapy evaluation and counseling + isolation needs for disease transmission risk assessment and mitigation   Microbiology:   Antibiotics:    Cultures: Blood 7/11 1/4 MSSA Urine  Other   HPI: Justin [name] is a 73 y.o. male with past medical history of ESRD on IHD Tuesday Thursday Saturday, via left AV F, heart failure preserved ejection, CAD, AAS, transient, hyperlipidemia, diabetes type 2 recently admitted 7/11 - 7/16 for Magick/secondary bleeding fistula during HD underwent fistulogram and angioplasty [place] per IR on 7/14 followed now presents with MSSA bacteremia on day 5.  On arrival patient afebrile.  ID engaged due to MSSA bacteremia   Review of Systems: Review of Systems  All other systems reviewed and are negative.   Past Medical History:  Diagnosis Date   BPH (benign prostatic hyperplasia)    Diabetes (HCC)    Dysuria    Erectile dysfunction    ESRD on hemodialysis (HCC)    History of chronic CHF 11/19/2019   Hyperlipemia    Hypertension    Personal  history of COVID-19 11/19/2019   Pinna disorder, left    Wears glasses     Social History   Tobacco Use   Smoking status: Never   Smokeless tobacco: Never  Vaping Use   Vaping status: Never Used  Substance Use Topics   Alcohol  use: Not Currently   Drug use: Never    Family History  Problem Relation Age of Onset   Stroke Mother    Diabetes Father    Scheduled Meds:  acetaminophen   1,000 mg Oral TID   amLODipine   5 mg Oral Daily   aspirin  EC  81 mg Oral Daily   heparin  injection (subcutaneous)  5,000 Units Subcutaneous Q8H   insulin  aspart  0-15 Units Subcutaneous TID WC   insulin  glargine-yfgn  20 Units Subcutaneous QHS   isosorbide  mononitrate  30 mg Oral Once per day on Sunday Tuesday Thursday Saturday   methocarbamol   500 mg Oral TID   metoprolol  succinate  50 mg Oral Daily    morphine  injection  4 mg Intravenous Once   ondansetron  (ZOFRAN ) IV  4 mg Intravenous Once   rosuvastatin   40 mg Oral Daily   Continuous Infusions:   ceFAZolin  (ANCEF ) IV     PRN Meds:.oxyCODONE  No Known Allergies  OBJECTIVE: Blood pressure (!) 134/56, pulse 65, temperature 98.6 F (37 C), temperature source Oral, resp. rate 15, SpO2 100%.  Physical Exam Constitutional:      General: He is not in acute distress.    Appearance: He is  normal weight. He is not toxic-appearing.  HENT:     Head: Normocephalic and atraumatic.     Right Ear: External ear normal.     Left Ear: External ear normal.     Nose: No congestion or rhinorrhea.     Mouth/Throat:     Mouth: Mucous membranes are moist.     Pharynx: Oropharynx is clear.  Eyes:     Extraocular Movements: Extraocular movements intact.     Conjunctiva/sclera: Conjunctivae normal.     Pupils: Pupils are equal, round, and reactive to light.  Cardiovascular:     Rate and Rhythm: Normal rate and regular rhythm.     Heart sounds: No murmur heard.    No friction rub. No gallop.  Pulmonary:     Effort: Pulmonary effort is normal.      Breath sounds: Normal breath sounds.  Abdominal:     General: Abdomen is flat. Bowel sounds are normal.     Palpations: Abdomen is soft.  Musculoskeletal:        General: No swelling.     Cervical back: Normal range of motion and neck supple.     Comments: LUE avf bandaged  Skin:    General: Skin is warm and dry.  Neurological:     General: No focal deficit present.     Mental Status: He is oriented to person, place, and time.  Psychiatric:        Mood and Affect: Mood normal.     Lab Results Lab Results  Component Value Date   WBC 10.6 (H) 07/20/2023   HGB 7.9 (L) 07/20/2023   HCT 23.6 (L) 07/20/2023   MCV 86.4 07/20/2023   PLT 294 07/20/2023    Lab Results  Component Value Date   CREATININE 9.64 (H) 07/20/2023   BUN 41 (H) 07/20/2023   NA 129 (L) 07/20/2023   K 4.0 07/20/2023   CL 90 (L) 07/20/2023   CO2 23 07/20/2023    Lab Results  Component Value Date   ALT 81 (H) 07/19/2023   AST 49 (H) 07/19/2023   ALKPHOS 88 07/19/2023   BILITOT 0.7 07/19/2023       [name], MD [facility] 07/20/2023, 6:03 AM

## 2023-07-20 NOTE — Consult Note (Signed)
 HPI:     Patient is a 73 y.o. male presented to ED after recent lab work indicated MSSA bacteremia after most recent hospital admission for hemorrhagic shock d/t bleeding fistula. He has a PMH notable for ESRD on HD, HFpEF, CAD, AS, and HTN, HLD, DM2. NSGY consulted for concern for lumbar epidural abscess.     Patient Active Problem List   Diagnosis Date Noted   Bacteremia due to methicillin susceptible Staphylococcus aureus (MSSA) 07/19/2023   Hemorrhagic shock (HCC) 07/13/2023   Bilateral carotid artery stenosis 06/06/2023   Erosive esophagitis 06/06/2023   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) 06/06/2023   Vitamin B12 deficiency 06/06/2023   Dependence on renal dialysis (HCC) 02/26/2023   Fall 02/26/2023   Gastroesophageal reflux disease without esophagitis 02/26/2023   Fever, unspecified 02/26/2023   Headache, unspecified 02/26/2023   Pure hypercholesterolemia 02/26/2023   Other fluid overload 02/26/2023   Esophagitis determined by endoscopy - 02-24-2023 02/25/2023   Insomnia due to medical condition 02/25/2023   Hiccups 02/24/2023   Acute postoperative anemia due to expected blood loss 02/23/2023   Globus sensation 02/23/2023   Delirium due to multiple etiologies 02/23/2023   S/P ORIF (open reduction internal fixation) fracture - right intertrochanteric fracture 02/17/2023   Closed comminuted intertrochanteric fracture of proximal end of femur with nonunion, right 02/16/2023   Type 2 diabetes mellitus with hyperglycemia (HCC) 02/16/2023   BPH (benign prostatic hyperplasia) 10/18/2020   Dysuria 10/18/2020   Pinna disorder, left 10/18/2020   Renal disorder 10/18/2020   Wears glasses 10/18/2020   Coronary artery disease involving native coronary artery of native heart without angina pectoris 11/19/2019   ED (erectile dysfunction) 11/19/2019   Essential hypertension 11/19/2019   ESRD on hemodialysis (HCC) - out-pt HD at [facility] [place] on TTS 5:15 am chair  time 11/19/2019   Other disorders of phosphorus metabolism 06/28/2018   Aortic valve disorder 05/07/2018   Mild protein-calorie malnutrition (HCC) 05/02/2018   Anemia in chronic kidney disease 05/01/2018   Coagulation defect, unspecified (HCC) 05/01/2018   Iron  deficiency anemia, unspecified 05/01/2018   Pruritus, unspecified 05/01/2018   Pain, unspecified 05/01/2018   Shortness of breath 05/01/2018   Secondary hyperparathyroidism of renal origin (HCC) 05/01/2018   Nonrheumatic aortic valve stenosis    Chronic systolic CHF (congestive heart failure) (HCC) - now with recovered LVEF 04/24/2018   Hypertensive heart disease with congestive heart failure and stage 5 kidney disease (HCC) 04/24/2018   Type 2 diabetes mellitus with chronic kidney disease on chronic dialysis, with long-term current use of insulin  (HCC) 04/24/2018   Hyperlipidemia 04/24/2018   Long term (current) use of insulin  (HCC) 04/24/2018   Lower urinary tract symptoms due to benign prostatic hyperplasia 05/20/2014   Past Medical History:  Diagnosis Date   BPH (benign prostatic hyperplasia)    Diabetes (HCC)    Dysuria    Erectile dysfunction    ESRD on hemodialysis (HCC)    History of chronic CHF 11/19/2019   Hyperlipemia    Hypertension    Personal history of COVID-19 11/19/2019   Pinna disorder, left    Wears glasses     Past Surgical History:  Procedure Laterality Date   A/V FISTULAGRAM Left 07/16/2023   Procedure: A/V Fistulagram;  Surgeon: Serene Gaile ORN, MD;  Location: [facility] INVASIVE CV LAB;  Service: Cardiovascular;  Laterality: Left;   A/V SHUNT INTERVENTION N/A 04/16/2023   Procedure: A/V SHUNT INTERVENTION;  Surgeon: Norine Manuelita LABOR, MD;  Location: [facility] INVASIVE CV  LAB;  Service: Cardiovascular;  Laterality: N/A;   AV FISTULA PLACEMENT Left 04/30/2018   Procedure: CREATION OF RADIOCEPHALIC ARTERIOVENOUS FISTULA LEFT ARM;  Surgeon: Harvey Carlin BRAVO, MD;  Location: [facility] OR;  Service: Vascular;  Laterality:  Left;   AV FISTULA PLACEMENT Left 08/28/2018   Procedure: ARTERIOVENOUS (AV) BRACHIOCEPHALIC FISTULA CREATION LEFT UPPER ARM;  Surgeon: Gretta Lonni PARAS, MD;  Location: [facility] OR;  Service: Vascular;  Laterality: Left;   BIOPSY  02/24/2023   Procedure: BIOPSY;  Surgeon: Federico Rosario BROCKS, MD;  Location: [facility] ENDOSCOPY;  Service: Gastroenterology;;   ESOPHAGOGASTRODUODENOSCOPY (EGD) WITH PROPOFOL  N/A 02/24/2023   Procedure: ESOPHAGOGASTRODUODENOSCOPY (EGD) WITH PROPOFOL ;  Surgeon: Federico Rosario BROCKS, MD;  Location: [facility] ENDOSCOPY;  Service: Gastroenterology;  Laterality: N/A;  Patint is s/p right hip ORIF   INTRAMEDULLARY (IM) NAIL INTERTROCHANTERIC Right 02/17/2023   Procedure: INTRAMEDULLARY (IM) NAIL INTERTROCHANTERIC;  Surgeon: Sherida Adine BROCKS, MD;  Location: [facility] OR;  Service: Orthopedics;  Laterality: Right;   IR FLUORO GUIDE CV LINE RIGHT  04/29/2018   IR US  GUIDE VASC ACCESS RIGHT  04/29/2018   RIGHT/LEFT HEART CATH AND CORONARY ANGIOGRAPHY N/A 05/01/2018   Procedure: RIGHT/LEFT HEART CATH AND CORONARY ANGIOGRAPHY;  Surgeon: Claudene Victory ORN, MD;  Location: [facility] INVASIVE CV LAB;  Service: Cardiovascular;  Laterality: N/A;   RIGHT/LEFT HEART CATH AND CORONARY ANGIOGRAPHY N/A 09/29/2022   Procedure: RIGHT/LEFT HEART CATH AND CORONARY ANGIOGRAPHY;  Surgeon: Wonda Sharper, MD;  Location: [facility] INVASIVE CV LAB;  Service: Cardiovascular;  Laterality: N/A;   subdural hematoma Right 07/2020    Medications Prior to Admission  Medication Sig Dispense Refill Last Dose/Taking   acetaminophen  (TYLENOL ) 500 MG tablet Take 2 tablets (1,000 mg total) by mouth every 6 (six) hours as needed for mild pain (pain score 1-3), fever or headache.   Past Week   amLODipine  (NORVASC ) 5 MG tablet Take 5 mg by mouth daily.   07/18/2023 Morning   aspirin  EC 81 MG tablet Take 81 mg by mouth daily. Swallow whole.   07/18/2023 Morning   cyanocobalamin  (VITAMIN B12) 1000 MCG tablet Take 1,000 mcg by mouth daily.   07/18/2023 Morning   ferric  citrate (AURYXIA ) 1 GM 210 MG(Fe) tablet Take 630 mg by mouth 3 (three) times daily with meals.   07/18/2023 Morning   hydrALAZINE  (APRESOLINE ) 25 MG tablet Take 1 tablet (25 mg total) by mouth 3 (three) times daily. Take 50 mg 3 times daily on M, W, F(dialysis days) Take 50 mg twice daily on Tu, Th, Sat, Sun(non-dialysis days) (Patient taking differently: Take 50-150 mg by mouth See admin instructions. Take two tablets (50 mg) 2 times daily on M, W, Friday Sun(non dialysis days) Take 150 mg twice daily on Tu, Th, Sat,(dialysis days))   07/18/2023 Morning   insulin  aspart protamine  - aspart (NOVOLOG  70/30 FLEXPEN) (70-30) 100 UNIT/ML FlexPen Inject 20 Units into the skin at bedtime.   07/18/2023 Bedtime   isosorbide  mononitrate (IMDUR ) 30 MG 24 hr tablet Take 1 tablet (30 mg total) by mouth 4 (four) times a week. Mon, Wed, Fri, Sun (non-dialysis days) 64 tablet 2 07/18/2023 Morning   methocarbamol  (ROBAXIN ) 500 MG tablet Take 1 tablet (500 mg total) by mouth every 8 (eight) hours as needed for muscle spasms. 15 tablet 0 Unknown   metoprolol  succinate (TOPROL -XL) 50 MG 24 hr tablet Take 50 mg by mouth daily.   07/18/2023 Morning   ondansetron  (ZOFRAN -ODT) 4 MG disintegrating tablet Take 1 tablet by mouth.   Unknown  oxyCODONE -acetaminophen  (PERCOCET/ROXICET) 5-325 MG tablet Take 1 tablet by mouth every 8 (eight) hours as needed for severe pain (pain score 7-10). 10 tablet 0 07/19/2023 Morning   rosuvastatin  (CRESTOR ) 40 MG tablet Take 40 mg by mouth daily.   07/18/2023 Morning   amLODipine  (NORVASC ) 10 MG tablet Take 0.5 tablets (5 mg total) by mouth daily. (Patient not taking: Reported on 07/19/2023) 30 tablet 0 Not Taking   nitroGLYCERIN  (NITROSTAT ) 0.4 MG SL tablet Place 0.4 mg under the tongue every 5 (five) minutes as needed for chest pain.   Unknown   No Known Allergies  Social History   Tobacco Use   Smoking status: Never   Smokeless tobacco: Never  Substance Use Topics   Alcohol  use: Not Currently     Family History  Problem Relation Age of Onset   Stroke Mother    Diabetes Father      Objective:   Patient Vitals for the past 8 hrs:  BP Temp Temp src Pulse Resp SpO2  07/20/23 0355 (!) 134/56 98.6 F (37 C) Oral 65 15 100 %  07/20/23 0200 (!) 134/56 -- -- 70 19 99 %  07/20/23 0145 -- -- -- 71 -- 99 %  07/20/23 0130 -- -- -- 71 -- 98 %  07/20/23 0115 -- -- -- 73 -- 98 %  07/20/23 0100 -- -- -- 74 -- 97 %  07/20/23 0045 -- -- -- 73 -- 98 %  07/20/23 0030 -- -- -- 74 -- 98 %  07/20/23 0015 -- -- -- 72 -- 98 %   I/O last 3 completed shifts: In: 101 [IV Piggyback:50] Out: -  No intake/output data recorded.  MR Lumbar Spine W Wo Contrast Addendum Date: 07/19/2023 ADDENDUM REPORT: 07/19/2023 18:25 ADDENDUM: These results were called by telephone at the time of interpretation on 07/19/2023 at 6:24 pm to provider Dr. Marsha Ada, who verbally acknowledged these results. Electronically Signed   By: Evalene Coho M.D.   On: 07/19/2023 18:25   Result Date: 07/19/2023 CLINICAL DATA:  Myelopathy, acute, lumbar spine concern for epidural abscess EXAM: MRI LUMBAR SPINE WITHOUT AND WITH CONTRAST TECHNIQUE: Multiplanar and multiecho pulse sequences of the lumbar spine were obtained without and with intravenous contrast. CONTRAST:  7mL GADAVIST  GADOBUTROL  1 MMOL/ML IV SOLN COMPARISON:  None Available. FINDINGS: Segmentation:  Standard. Alignment:  Physiologic. Vertebrae: Vertebral bodies are diffusely heterogeneous in signal intensity. There are discogenic reactive changes also present within the endplates at L2-3 and L3-4. Conus medullaris and cauda equina: Conus extends to the T12-L1 level. The conus appears normal. There is central crowding of the nerve roots at L3-4 secondary to an epidural fluid collection. Paraspinal and other soft tissues: There is a left posterolateral epidural fluid collection at the L3-4 level, measuring approximately 12 x 4 x 23 mm. It impresses upon the thecal sac and  displaces the nerve roots of the cauda equina anteriorly and laterally to the right. There is enhancement of numerous nerve roots on the left. There is also a multilocular peripherally enhancing fluid collection in the left paraspinous musculature, measuring approximately 3.0 x 1.8 x 6.7 cm. There is increased T2 signal within the left facet joint, but no convincing evidence of osteomyelitis. Disc levels: There is mild disc space narrowing and mild disc bulging at L2-3 and L4-5, without significant spinal canal stenosis at either level. IMPRESSION: 1. There is a left-sided epidural fluid collection at the L3-4 level level impressing upon the thecal sac, compatible with epidural abscess/phlegmon. There is  associated enhancement of several left nerve roots. 2. Large left multilocular paraspinous abscess. Electronically Signed: By: Evalene Coho M.D. On: 07/19/2023 18:10   DG Chest Port 1 View Result Date: 07/19/2023 CLINICAL DATA:  Questionable sepsis - evaluate for abnormality EXAM: PORTABLE CHEST - 1 VIEW COMPARISON:  None available. FINDINGS: No focal airspace consolidation, pleural effusion, or pneumothorax. Moderate cardiomegaly. Aortic atherosclerosis. No acute fracture or destructive lesions. Multilevel thoracic osteophytosis. IMPRESSION: Cardiomegaly.  No pneumonia or pulmonary edema. Electronically Signed   By: Rogelia Myers M.D.   On: 07/19/2023 14:01   Awake, alert, oriented Speech fluent, appropriate CN grossly intact 5/5 BUE/BLE SILTx4   Assessment:   Principal Problem:   Bacteremia due to methicillin susceptible Staphylococcus aureus (MSSA)  This is a 73 yo with lumbar epidural abscess with additional abscess in the left paraspinal musculature who is currently neurologically intact Plan:   -Recommend IR consult for consideration of aspiration of parapspinal abscess -Medical mgmt of infection per ID/primary team -No acute neurosurgical intervention indicated at this time.    Treasa Bradshaw CAYLIN Deion Swift, PA-C

## 2023-07-20 NOTE — Progress Notes (Addendum)
   Contacted by primary team to arrange TEE for bacteremia. Upon chart review, patient had an upper endoscopy done 02/24/2023 that showed grade D esophagitis and biopsy that confirmed active esophagitis with focal necrosis. At that time, he was started on PPI and was supposed to follow up with GI for repeat EGD in 2-3 months, it does not appear that he had that workup completed.   Recommend further GI workup with repeat upper endoscopy prior to undergoing TEE. Discussed with primary team. Will await further recommendations prior to proceeding with TEE. Primary team to reach back out when GI workup is complete.   Waddell DELENA Donath, PA-C 07/20/2023 3:44 PM

## 2023-07-20 NOTE — Progress Notes (Signed)
Pt receives out-pt HD at [facility] on TTS. Will assist as needed.   Caliyah Sieh Renal Navigator [phone] 

## 2023-07-21 ENCOUNTER — Encounter (HOSPITAL_COMMUNITY): Payer: Self-pay | Admitting: Hospitalist

## 2023-07-21 DIAGNOSIS — B9561 Methicillin susceptible Staphylococcus aureus infection as the cause of diseases classified elsewhere: Secondary | ICD-10-CM | POA: Diagnosis not present

## 2023-07-21 DIAGNOSIS — R7881 Bacteremia: Secondary | ICD-10-CM | POA: Diagnosis not present

## 2023-07-21 DIAGNOSIS — K209 Esophagitis, unspecified without bleeding: Secondary | ICD-10-CM | POA: Diagnosis not present

## 2023-07-21 LAB — CBC
HCT: 23.9 % — ABNORMAL LOW (ref 39.0–52.0)
Hemoglobin: 8.1 g/dL — ABNORMAL LOW (ref 13.0–17.0)
MCH: 28.8 pg (ref 26.0–34.0)
MCHC: 33.9 g/dL (ref 30.0–36.0)
MCV: 85.1 fL (ref 80.0–100.0)
Platelets: 321 K/uL (ref 150–400)
RBC: 2.81 MIL/uL — ABNORMAL LOW (ref 4.22–5.81)
RDW: 14.4 % (ref 11.5–15.5)
WBC: 9.6 K/uL (ref 4.0–10.5)
nRBC: 0 % (ref 0.0–0.2)

## 2023-07-21 LAB — GLUCOSE, CAPILLARY
Glucose-Capillary: 104 mg/dL — ABNORMAL HIGH (ref 70–99)
Glucose-Capillary: 109 mg/dL — ABNORMAL HIGH (ref 70–99)
Glucose-Capillary: 167 mg/dL — ABNORMAL HIGH (ref 70–99)

## 2023-07-21 LAB — RENAL FUNCTION PANEL
Albumin: 2.3 g/dL — ABNORMAL LOW (ref 3.5–5.0)
Anion gap: 18 — ABNORMAL HIGH (ref 5–15)
BUN: 51 mg/dL — ABNORMAL HIGH (ref 8–23)
CO2: 20 mmol/L — ABNORMAL LOW (ref 22–32)
Calcium: 7.3 mg/dL — ABNORMAL LOW (ref 8.9–10.3)
Chloride: 88 mmol/L — ABNORMAL LOW (ref 98–111)
Creatinine, Ser: 11.08 mg/dL — ABNORMAL HIGH (ref 0.61–1.24)
GFR, Estimated: 4 mL/min — ABNORMAL LOW (ref >60)
Glucose, Bld: 99 mg/dL (ref 70–99)
Phosphorus: 9.1 mg/dL — ABNORMAL HIGH (ref 2.5–4.6)
Potassium: 4 mmol/L (ref 3.5–5.1)
Sodium: 126 mmol/L — ABNORMAL LOW (ref 135–145)

## 2023-07-21 LAB — HEPATITIS B SURFACE ANTIBODY, QUANTITATIVE: Hep B S AB Quant (Post): <3.5 m[IU]/mL — ABNORMAL LOW

## 2023-07-21 MED ORDER — HYDROMORPHONE HCL 1 MG/ML IJ SOLN
INTRAMUSCULAR | Status: AC
  Filled 2023-07-21: qty 0.5

## 2023-07-21 MED ORDER — ALTEPLASE 2 MG IJ SOLR: 2.0000 mg | Freq: Once | INTRAMUSCULAR | Status: DC | PRN

## 2023-07-21 MED ORDER — PENTAFLUOROPROP-TETRAFLUOROETH EX AERO: 1.0000 | INHALATION_SPRAY | CUTANEOUS | Status: DC | PRN

## 2023-07-21 MED ORDER — LIDOCAINE HCL (PF) 1 % IJ SOLN: 5.0000 mL | INTRAMUSCULAR | Status: DC | PRN

## 2023-07-21 MED ORDER — LIDOCAINE-PRILOCAINE 2.5-2.5 % EX CREA: 1.0000 | TOPICAL_CREAM | CUTANEOUS | Status: DC | PRN

## 2023-07-21 MED ORDER — ANTICOAGULANT SODIUM CITRATE 4% (200MG/5ML) IV SOLN
5.0000 mL | Status: DC | PRN
Start: 2023-07-21 — End: 2023-07-22

## 2023-07-21 MED ORDER — OXYCODONE-ACETAMINOPHEN 5-325 MG PO TABS
ORAL_TABLET | ORAL | Status: AC
  Filled 2023-07-21: qty 1

## 2023-07-21 MED ORDER — HEPARIN SODIUM (PORCINE) 1000 UNIT/ML DIALYSIS: 1000.0000 [IU] | INTRAMUSCULAR | Status: DC | PRN

## 2023-07-21 NOTE — Progress Notes (Signed)
 Pt leaving the floor for HD via bed. Pt highly irritable.   0150: Pt returned from dialysis. Pt c/o pain in flank. Pt previously medicated in HD with both oxycodone  @0056  and dilaudid  @ 2246.

## 2023-07-21 NOTE — Progress Notes (Signed)
 Progress Note   Patient: Justin [name] FMW:[id number] DOB: [birth year] DOA: 07/19/2023     2 DOS: the patient was seen and examined on 07/21/2023   Brief hospital course: 73 year old man with PMH of ESRD on HD TTS, HFpEF, CAD, AAS, HTN, HLD, T2DM and recent admission from 7/11-7/16 for hemorrhaghic shock 2/2 bleeding fistula during HD who underwent fistulogram and angioplasty per IR on 7/14.  Patient was called back to the hospital the day after discharge due to positive blood cultures with MSSA growing from cultures drawn on 7/11 (prior admission).   Further evaluation has revealed a left-sided epidural fluid collection at the L3-L4 level.  Neurosurgery and interventional radiology consulted. Infectious disease, nephrology, vascular surgery also on consult.  Assessment and Plan:  Epidural abscess MSSA bacteremia Patient called back to hospital due to positive blood cultures. MRI of lumbar spine revealed epidural abscess. Infectious disease consulted.  Input appreciated. Neurosurgery consulted for drainage of abscess. Given no neurological complications, IR consult recommended for aspiration. IR has been consulted, fluoro-guided aspiration of left lumbar paraspinal process completed on 7/18 - Leukocytosis resolved, remains afebrile - Cardiology consulted for TTE, recommends EGD by GI due to history of esophagitis before for necrosis - GI consulted, plan for EGD on 7/21 - Continue cefazolin . - Repeat blood cultures no growth in <12 hrs - Will follow-up cultures from spinal aspirate  ESRD on HD Patient on TTS hemodialysis schedule. He reports his last hemodialysis was on 7/16 while he was admitted. Nephrology consulted. Input appreciated. - Continue with hemodialysis per nephrology. - Plan for HD today on scheduled  Recent admission from bleeding fistula. S/p fistulogram and angioplasty on 7/14. Patient had some bleeding from his AV fistula on 7/18. Addressed by vascular surgery. -  Will continue to monitor.  HTN - Continue home Norvasc , Imdur , metoprolol .  T2DM, controlled A1c was 5.3 on 7/17 - Diabetic diet. - Long-acting, Premeal and sliding scale insulin .  Normocytic anemia Anemia of CKD - Hgb stable at 8.1 - Trend CBC - Transfuse if hemoglobin less than 7.  Hyponatremia Patient appears to be hyponatremic at baseline. - Will monitor sodium levels.      Subjective: Reports he did not sleep well last night. Continues to have intermittent low back pain from the site of the IR aspiration.  No other complaints.  Physical Exam: Vitals:   07/20/23 1640 07/20/23 1720 07/20/23 2040 07/21/23 0535  BP: (!) 151/64 (!) 159/63 121/61 (!) 156/65  Pulse: 63 64 61 (!) 55  Resp: 18 19 18 20   Temp:  98.5 F (36.9 C) 98.2 F (36.8 C) 97.6 F (36.4 C)  TempSrc:  Oral Oral Oral  SpO2: 100% 100% 98% 97%   Physical Exam   General: Pleasant, chronically ill elderly man laying in bed. No acute distress. HEENT: [place]/AT. Anicteric sclera CV: RRR. Vascular murmur. No LE edema Pulmonary: Lungs CTAB. Normal effort. No wheezing or rales. Abdominal: Soft, nontender, nondistended. Normal bowel sounds. Extremities: Palpable radial and DP pulses. Normal ROM. Skin: Warm and dry. No obvious rash or lesions. Neuro: A&Ox3. Moves all extremities. Normal sensation to light touch. No focal deficit. Psych: Normal mood and affect HD Access: LUE AVF stable for palpable thrill   Data Reviewed:     Latest Ref Rng & Units 07/20/2023    4:09 AM 07/19/2023   11:34 AM 07/18/2023    8:46 AM  CBC  WBC 4.0 - 10.5 K/uL 10.6  12.4  9.3   Hemoglobin 13.0 - 17.0 g/dL 7.9  9.0  9.8   Hematocrit 39.0 - 52.0 % 23.6  26.8  29.5   Platelets 150 - 400 K/uL 294  298  264       Latest Ref Rng & Units 07/20/2023    4:09 AM 07/19/2023   11:34 AM 07/18/2023    8:46 AM  BMP  Glucose 70 - 99 mg/dL 858  846  826   BUN 8 - 23 mg/dL 41  34  23   Creatinine 0.61 - 1.24 mg/dL 0.35  1.50  3.66   Sodium  135 - 145 mmol/L 129  129  131   Potassium 3.5 - 5.1 mmol/L 4.0  4.1  3.6   Chloride 98 - 111 mmol/L 90  89  90   CO2 22 - 32 mmol/L 23  23  28    Calcium  8.9 - 10.3 mg/dL 7.6  8.1  8.5      Family Communication: No family at bedside  Disposition: Status is: Inpatient Remains inpatient appropriate because: On IV antibiotics for MSSA bacteremia, epidural abscess  Planned Discharge Destination: Home    Time spent: 35 minutes  Author: Claretta CHRISTELLA Alderman, MD 07/21/2023 8:22 AM  For on call review [email or web address].

## 2023-07-21 NOTE — H&P (View-Only) (Signed)
 Inpatient Consultation   Referring Provider:      Primary Care Physician:  Bernie Lamar PARAS, MD Primary Gastroenterologist:   Dr. Glendia Holt      Reason for Consultation:    History of LA grade D esophagitis, EGD prior to TEE         HPI  Justin [name] is a 73 y.o. male with medical history significant of ESRD (iHD on TTS), HFpEF, CAD, AS, and HTN, HLD, DM2 admitted 07/19/2023 with MSSA bacteremia after previous admission for HD fistula bleeding for which he underwent fistulogram and angioplasty with IR 07/16/2023.    Patient is being evaluated for endocarditis.  GI consultation is requested for EGD prior to TEE.  Mr. Justin [name] was previously seen by our GI inpatient team  02/2023 for symptoms of globus sensation and abnormal barium swallow study.  He underwent EGD showing LA grade D esophagitis, gastritis, and a 10 mm submucosal nodule in the second portion of the duodenum.  Gastric biopsies showed reactive gastropathy without H. pylori.  Esophageal biopsies showed active esophagitis with focal necrosis and marked reactive epithelial changes.  Duodenal biopsies showed foveolar metaplasia and Brunner's gland hyperplasia consistent with peptic duodenitis.  He was advised to take Protonix  40 mg p.o. twice daily x 12 weeks.  Recommended for follow-up EGD after 12 weeks of treatment to confirm healing of esophagitis but this did not occur.  Today's interview, he denies symptoms of GERD, regurgitation, dysphagia or odynophagia.  No abdominal pain, nausea or vomiting.  He does have a history of iron  deficiency anemia.  Colonoscopy performed in 2021 showed diverticulosis and 2 polyps ranging 7 to 10 mm.  Pathology report shows tubular adenomas.  In the setting of ESRD on hemodialysis and multiple medical problems, anemia is likely multifactorial.  He is on aspirin  and receiving subcu heparin  in the hospital but no long-term anticoagulants  Past Medical History:  Diagnosis Date   BPH (benign  prostatic hyperplasia)    Diabetes (HCC)    Dysuria    Erectile dysfunction    ESRD on hemodialysis (HCC)    History of chronic CHF 11/19/2019   Hyperlipemia    Hypertension    Personal history of COVID-19 11/19/2019   Pinna disorder, left    Wears glasses     Past Surgical History:  Procedure Laterality Date   A/V FISTULAGRAM Left 07/16/2023   Procedure: A/V Fistulagram;  Surgeon: Serene Gaile LELON, MD;  Location: [facility] INVASIVE CV LAB;  Service: Cardiovascular;  Laterality: Left;   A/V SHUNT INTERVENTION N/A 04/16/2023   Procedure: A/V SHUNT INTERVENTION;  Surgeon: Norine Manuelita LABOR, MD;  Location: [facility] INVASIVE CV LAB;  Service: Cardiovascular;  Laterality: N/A;   AV FISTULA PLACEMENT Left 04/30/2018   Procedure: CREATION OF RADIOCEPHALIC ARTERIOVENOUS FISTULA LEFT ARM;  Surgeon: Harvey Carlin BRAVO, MD;  Location[facility] OR;  Service: Vascular;  Laterality: Left;   AV FISTULA PLACEMENT Left 08/28/2018   Procedure: ARTERIOVENOUS (AV) BRACHIOCEPHALIC FISTULA CREATION LEFT UPPER ARM;  Surgeon: Gretta Lonni PARAS, MD;  Location: [facility] OR;  Service: Vascular;  Laterality: Left;   BIOPSY  02/24/2023   Procedure: BIOPSY;  Surgeon: Federico Rosario BROCKS, MD;  Location: [facility] ENDOSCOPY;  Service: Gastroenterology;;   ESOPHAGOGASTRODUODENOSCOPY (EGD) WITH PROPOFOL  N/A 02/24/2023   Procedure: ESOPHAGOGASTRODUODENOSCOPY (EGD) WITH PROPOFOL ;  Surgeon: Federico Rosario BROCKS, MD;  Location: [facility] ENDOSCOPY;  Service: Gastroenterology;  Laterality: N/A;  Patint is s/p right hip ORIF   INTRAMEDULLARY (IM) NAIL INTERTROCHANTERIC Right 02/17/2023  Procedure: INTRAMEDULLARY (IM) NAIL INTERTROCHANTERIC;  Surgeon: Sherida Adine BROCKS, MD;  Location: [facility] OR;  Service: Orthopedics;  Laterality: Right;   IR FLUORO GUIDE CV LINE RIGHT  04/29/2018   IR FLUORO GUIDED NEEDLE PLC ASPIRATION/INJECTION LOC  07/20/2023   IR US  GUIDE VASC ACCESS RIGHT  04/29/2018   RIGHT/LEFT HEART CATH AND CORONARY ANGIOGRAPHY N/A 05/01/2018   Procedure: RIGHT/LEFT HEART  CATH AND CORONARY ANGIOGRAPHY;  Surgeon: Claudene Victory ORN, MD;  Location: [facility] INVASIVE CV LAB;  Service: Cardiovascular;  Laterality: N/A;   RIGHT/LEFT HEART CATH AND CORONARY ANGIOGRAPHY N/A 09/29/2022   Procedure: RIGHT/LEFT HEART CATH AND CORONARY ANGIOGRAPHY;  Surgeon: Wonda Sharper, MD;  Location: [facility] INVASIVE CV LAB;  Service: Cardiovascular;  Laterality: N/A;   subdural hematoma Right 07/2020    Family History  Problem Relation Age of Onset   Stroke Mother    Diabetes Father      Social History   Tobacco Use   Smoking status: Never   Smokeless tobacco: Never  Vaping Use   Vaping status: Never Used  Substance Use Topics   Alcohol  use: Not Currently   Drug use: Never    Prior to Admission medications   Medication Sig Start Date End Date Taking? Authorizing Provider  acetaminophen  (TYLENOL ) 500 MG tablet Take 2 tablets (1,000 mg total) by mouth every 6 (six) hours as needed for mild pain (pain score 1-3), fever or headache. 02/23/23  Yes Laurence Locus, DO  amLODipine  (NORVASC ) 5 MG tablet Take 5 mg by mouth daily.   Yes [provider]  aspirin  EC 81 MG tablet Take 81 mg by mouth daily. Swallow whole.   Yes [provider]  cyanocobalamin  (VITAMIN B12) 1000 MCG tablet Take 1,000 mcg by mouth daily.   Yes [provider]  ferric citrate  (AURYXIA ) 1 GM 210 MG(Fe) tablet Take 630 mg by mouth 3 (three) times daily with meals.   Yes [provider]  hydrALAZINE  (APRESOLINE ) 25 MG tablet Take 1 tablet (25 mg total) by mouth 3 (three) times daily. Take 50 mg 3 times daily on M, W, F(dialysis days) Take 50 mg twice daily on Tu, Th, Sat, Sun(non-dialysis days) Patient taking differently: Take 50-150 mg by mouth See admin instructions. Take two tablets (50 mg) 2 times daily on M, W, Friday Sun(non dialysis days) Take 150 mg twice daily on Tu, Th, Sat,(dialysis days) 07/18/23  Yes Rashid, Farhan, MD  insulin  aspart protamine  - aspart (NOVOLOG  70/30 FLEXPEN)  (70-30) 100 UNIT/ML FlexPen Inject 20 Units into the skin at bedtime.   Yes [provider]  isosorbide  mononitrate (IMDUR ) 30 MG 24 hr tablet Take 1 tablet (30 mg total) by mouth 4 (four) times a week. Mon, Wed, Fri, Sun (non-dialysis days) 10/21/22  Yes [name], [name], MD  methocarbamol  (ROBAXIN ) 500 MG tablet Take 1 tablet (500 mg total) by mouth every 8 (eight) hours as needed for muscle spasms. 07/18/23  Yes Rashid, Farhan, MD  metoprolol  succinate (TOPROL -XL) 50 MG 24 hr tablet Take 50 mg by mouth daily.   Yes [provider]  ondansetron  (ZOFRAN -ODT) 4 MG disintegrating tablet Take 1 tablet by mouth. 07/12/23  Yes [provider]  oxyCODONE -acetaminophen  (PERCOCET/ROXICET) 5-325 MG tablet Take 1 tablet by mouth every 8 (eight) hours as needed for severe pain (pain score 7-10). 07/18/23  Yes Rashid, Farhan, MD  rosuvastatin  (CRESTOR ) 40 MG tablet Take 40 mg by mouth daily. 01/22/22  Yes [provider]  amLODipine  (NORVASC ) 10 MG tablet Take  0.5 tablets (5 mg total) by mouth daily. Patient not taking: Reported on 07/19/2023 07/18/23   Rashid, Farhan, MD  nitroGLYCERIN  (NITROSTAT ) 0.4 MG SL tablet Place 0.4 mg under the tongue every 5 (five) minutes as needed for chest pain.    [provider]    Current Facility-Administered Medications  Medication Dose Route Frequency Provider Last Rate Last Admin   (feeding supplement) PROSource Plus liquid 30 mL  30 mL Oral BID BM Stovall, Kathryn R, PA-C       acetaminophen  (TYLENOL ) tablet 1,000 mg  1,000 mg Oral TID Georgina Basket, MD   1,000 mg at 07/21/23 9187   amLODipine  (NORVASC ) tablet 5 mg  5 mg Oral Daily Georgina Basket, MD   5 mg at 07/21/23 9188   aspirin  EC tablet 81 mg  81 mg Oral Daily Georgina Basket, MD   81 mg at 07/21/23 0811   ceFAZolin  (ANCEF ) IVPB 1 g/50 mL premix  1 g Intravenous Q24H Paytes, Austin A, RPH 100 mL/hr at 07/20/23 1745 1 g at 07/20/23 1745   [START ON 07/22/2023] Darbepoetin Alfa   (ARANESP ) injection 200 mcg  200 mcg Subcutaneous Q Sun-1800 Jerrye Katheryn BROCKS, MD       heparin  injection 5,000 Units  5,000 Units Subcutaneous Q8H Georgina Basket, MD   5,000 Units at 07/21/23 0540   HYDROmorphone  (DILAUDID ) injection 0.4 mg  0.4 mg Intravenous Q4H PRN Mdala-Gausi, Masiku Agatha, MD       insulin  aspart (novoLOG ) injection 0-15 Units  0-15 Units Subcutaneous TID WC Georgina Basket, MD   2 Units at 07/19/23 1811   insulin  glargine-yfgn (SEMGLEE ) injection 20 Units  20 Units Subcutaneous QHS Georgina Basket, MD   20 Units at 07/20/23 2144   isosorbide  mononitrate (IMDUR ) 24 hr tablet 30 mg  30 mg Oral Once per day on Sunday Tuesday Thursday Saturday Georgina Basket, MD   30 mg at 07/21/23 9188   methocarbamol  (ROBAXIN ) tablet 500 mg  500 mg Oral TID Georgina Basket, MD   500 mg at 07/21/23 9187   metoprolol  succinate (TOPROL -XL) 24 hr tablet 50 mg  50 mg Oral Daily Georgina Basket, MD   50 mg at 07/21/23 9188   ondansetron  (ZOFRAN ) injection 4 mg  4 mg Intravenous Once Kommor, Madison, MD       oxyCODONE  (Oxy IR/ROXICODONE ) immediate release tablet 5 mg  5 mg Oral Q4H PRN Georgina Basket, MD   5 mg at 07/21/23 9187   rosuvastatin  (CRESTOR ) tablet 40 mg  40 mg Oral Daily Moore, Willie, MD   40 mg at 07/21/23 9188    Allergies as of 07/19/2023   (No Known Allergies)    GI Review of Symptoms Significant for None. Otherwise negative.  General Review of Systems  Review of systems is significant for the pertinent positives and negatives as listed per the HPI.  Full ROS is otherwise negative.    Physical Exam  Vital signs in last 24 hours: Temp:  [97.6 F (36.4 C)-98.5 F (36.9 C)] 98.2 F (36.8 C) (07/19 1345) Pulse Rate:  [55-65] 65 (07/19 1345) Resp:  [13-20] 18 (07/19 1345) BP: (121-173)/(61-92) 155/79 (07/19 1345) SpO2:  [95 %-100 %] 100 % (07/19 0826) Weight:  [75.5 kg] 75.5 kg (07/19 1345) Last BM Date : 07/16/23 General: Alert, resting in bed no distress Head:  Normocephalic and  atraumatic. Eyes:   Anicteric sclerae Lungs: Respirations even and unlabored. Lungs clear to auscultation bilaterally.   No wheezes, crackles, or rhonchi.  Heart: Normal S1,  S2. No MRG. Regular rate and rhythm. No peripheral edema, cyanosis or pallor.  Abdomen:  Soft, nondistended, nontender. No rebound or guarding. Normal bowel sounds. No appreciable masses or hepatomegaly. Rectal:   Deferred   Lab Results Recent Labs    07/19/23 1134 07/20/23 0409 07/21/23 1307  WBC 12.4* 10.6* 9.6  HGB 9.0* 7.9* 8.1*  HCT 26.8* 23.6* 23.9*  PLT 298 294 321   BMET Recent Labs    07/19/23 1134 07/20/23 0409  NA 129* 129*  K 4.1 4.0  CL 89* 90*  CO2 23 23  GLUCOSE 153* 141*  BUN 34* 41*  CREATININE 8.49* 9.64*  CALCIUM  8.1* 7.6*   LFT Recent Labs    07/19/23 1134  PROT 6.6  ALBUMIN  2.7*  AST 49*  ALT 81*  ALKPHOS 88  BILITOT 0.7   PT/INR Recent Labs    07/19/23 1134  LABPROT 16.5*  INR 1.3*    Radiographic Studies IR Fluoro Guide Ndl Plmt / BX Result Date: 07/20/2023 INDICATION: Left-sided epidural fluid collection at the L3-4 level level impressing upon the thecal sac, compatible with epidural abscess/phlegmon. There is associated enhancement of several left nerve roots.  Large left multilocular paraspinous abscess. EXAM: LEFT LUMBAR PARASPINAL ASPIRATION UNDER FLUOROSCOPY MEDICATIONS: Lidocaine  1% subcutaneous ANESTHESIA/SEDATION: Intravenous Fentanyl  25mcg and Versed  1mg  were administered by RN during a total moderate (conscious) sedation time of 10 minutes; the patient's level of consciousness and physiological / cardiorespiratory status were monitored continuously by radiology RN under my direct supervision. PROCEDURE: Informed written consent was obtained from the patient after a thorough discussion of the procedural risks, benefits and alternatives. All questions were addressed. Maximal Sterile Barrier Technique was utilized including caps, mask, sterile gowns, sterile  gloves, sterile drape, hand hygiene and skin antiseptic. A timeout was performed prior to the initiation of the procedure. Patient placed right lateral decubitus. The appropriate interspace was identified under fluoroscopy, corresponding to previous cross-sectional imaging. An appropriate skin entry site was determined. After local infiltration with 1% lidocaine , a 18 gauge percutaneous entry needle was advanced into the left paraspinal soft tissues posterior to the left L3-4 facet. Needle tip position within the interspace confirmed on biplane images. 1.1 mL purulent fluid were aspirated, sent for the requested laboratory studies. The patient tolerated the procedure well. FLUOROSCOPY TIME:  Radiation Exposure Index (as provided by the fluoroscopic device): 7.3 mGy air Kerma COMPLICATIONS: None immediate. IMPRESSION: Technically successful left lumbar paraspinal abscess aspiration under fluoroscopy. Electronically Signed   By: JONETTA Faes M.D.   On: 07/20/2023 17:07   US  LT UPPER EXTREM LTD SOFT TISSUE NON VASCULAR Result Date: 07/20/2023 CLINICAL DATA:  Infection at AV fistula site. EXAM: ULTRASOUND left UPPER EXTREMITY LIMITED TECHNIQUE: Ultrasound examination of the upper extremity soft tissues was performed in the area of clinical concern. COMPARISON:  None Available. FINDINGS: AV fistula is visualized. No soft tissue edema or organized fluid collection. IMPRESSION: No soft tissue edema or organized fluid collection to suggest abscess. Electronically Signed   By: Newell Eke M.D.   On: 07/20/2023 09:15   MR Lumbar Spine W Wo Contrast Addendum Date: 07/19/2023 ADDENDUM REPORT: 07/19/2023 18:25 ADDENDUM: These results were called by telephone at the time of interpretation on 07/19/2023 at 6:24 pm to provider Dr. Marsha Ada, who verbally acknowledged these results. Electronically Signed   By: Evalene Coho M.D.   On: 07/19/2023 18:25   Result Date: 07/19/2023 CLINICAL DATA:  Myelopathy, acute,  lumbar spine concern for epidural abscess EXAM: MRI LUMBAR  SPINE WITHOUT AND WITH CONTRAST TECHNIQUE: Multiplanar and multiecho pulse sequences of the lumbar spine were obtained without and with intravenous contrast. CONTRAST:  7mL GADAVIST  GADOBUTROL  1 MMOL/ML IV SOLN COMPARISON:  None Available. FINDINGS: Segmentation:  Standard. Alignment:  Physiologic. Vertebrae: Vertebral bodies are diffusely heterogeneous in signal intensity. There are discogenic reactive changes also present within the endplates at L2-3 and L3-4. Conus medullaris and cauda equina: Conus extends to the T12-L1 level. The conus appears normal. There is central crowding of the nerve roots at L3-4 secondary to an epidural fluid collection. Paraspinal and other soft tissues: There is a left posterolateral epidural fluid collection at the L3-4 level, measuring approximately 12 x 4 x 23 mm. It impresses upon the thecal sac and displaces the nerve roots of the cauda equina anteriorly and laterally to the right. There is enhancement of numerous nerve roots on the left. There is also a multilocular peripherally enhancing fluid collection in the left paraspinous musculature, measuring approximately 3.0 x 1.8 x 6.7 cm. There is increased T2 signal within the left facet joint, but no convincing evidence of osteomyelitis. Disc levels: There is mild disc space narrowing and mild disc bulging at L2-3 and L4-5, without significant spinal canal stenosis at either level. IMPRESSION: 1. There is a left-sided epidural fluid collection at the L3-4 level level impressing upon the thecal sac, compatible with epidural abscess/phlegmon. There is associated enhancement of several left nerve roots. 2. Large left multilocular paraspinous abscess. Electronically Signed: By: Evalene Coho M.D. On: 07/19/2023 18:10    Endoscopic Studies     EGD 02/2023 A grade D esophagitis, gastritis, and a 10 mm submucosal nodule in the second portion of the duodenum.   Gastric  biopsies showed reactive gastropathy without H. pylori.   Esophageal biopsies showed active esophagitis with focal necrosis and marked reactive epithelial changes.   Duodenal biopsies showed foveolar metaplasia and Brunner's gland hyperplasia consistent with peptic duodenitis.   Clinical Impression   73 year old male with medical history significant of ESRD (iHD on TTS), HFpEF, CAD, AS, and HTN, HLD, DM2 admitted 07/19/2023 with MSSA bacteremia after previous admission for HD fistula bleeding for which he underwent fistulogram and angioplasty with IR 07/16/2023.    Patient is being evaluated for endocarditis.  GI consultation is requested for EGD prior to TEE.   He has a prior history of globus sensation and abnormal barium swallow in February 2025.  EGD at that time showed LA grade D esophagitis, gastritis and a submucosal nodule in the second portion of the duodenum.  Biopsies negative for H. pylori.  He was managed with twice daily PPI but did not have a follow-up EGD. As he is due for follow-up EGD and there were plans for future TEE, will coordinate EGD for further evaluation.   Plan  Plan for EGD 07/23/2023; please make n.p.o. the evening before Given that the patient is asymptomatic from a GI perspective can hold off on starting PPI at this time and we will see what EGD shows.  Thank you for your kind consultation, we will continue to follow.  Inocente HERO Drayk Humbarger  07/21/2023, 1:48 PM  Inocente Hausen, MD [facility]

## 2023-07-21 NOTE — Progress Notes (Addendum)
 [facility] Progress Note   Subjective:   Patient seen and examined at bedside. Reports 8/10 pain in lower back where they aspirated the fluid.  Just given pain medication.  No further bleeding from AVF.  No other complaints. Denies chest pain, shortness of breath, abdominal pain and n/v/d.  Frustrated and annoyed with being here.  States I am tired of yall asking me the same questions. I am going to put up a sign that says If there is something wrong I will tell you.  He apologized for being irritable.   Objective Vitals:   07/20/23 1720 07/20/23 2040 07/21/23 0535 07/21/23 0826  BP: (!) 159/63 121/61 (!) 156/65 (!) 173/75  Pulse: 64 61 (!) 55 (!) 57  Resp: 19 18 20 19   Temp: 98.5 F (36.9 C) 98.2 F (36.8 C) 97.6 F (36.4 C) 97.6 F (36.4 C)  TempSrc: Oral Oral Oral Oral  SpO2: 100% 98% 97% 100%   Physical Exam General:irritable, WDWN male in NAD Heart:RRR, +3/6 systolic murmur Lungs:CTAB, nml WOB on RA Abdomen:soft, NTND Extremities:no LE edema Dialysis Access: LU AVF +b/t  There were no vitals filed for this visit.  Intake/Output Summary (Last 24 hours) at 07/21/2023 0950 Last data filed at 07/21/2023 0826 Gross per 24 hour  Intake 240 ml  Output --  Net 240 ml    Additional Objective Labs: Basic Metabolic Panel: Recent Labs  Lab 07/15/23 0322 07/17/23 0420 07/18/23 0846 07/19/23 1134 07/20/23 0409  NA 132*   < > 131* 129* 129*  K 3.9   < > 3.6 4.1 4.0  CL 90*   < > 90* 89* 90*  CO2 25   < > 28 23 23   GLUCOSE 132*   < > 173* 153* 141*  BUN 66*   < > 23 34* 41*  CREATININE 11.76*   < > 6.33* 8.49* 9.64*  CALCIUM  6.9*   < > 8.5* 8.1* 7.6*  PHOS 7.3*  --  4.9*  --   --    < > = values in this interval not displayed.   Liver Function Tests: Recent Labs  Lab 07/18/23 0846 07/19/23 1134  AST  --  49*  ALT  --  81*  ALKPHOS  --  88  BILITOT  --  0.7  PROT  --  6.6  ALBUMIN  2.7* 2.7*   CBC: Recent Labs  Lab 07/16/23 1032  07/17/23 0420 07/18/23 0846 07/19/23 1134 07/20/23 0409  WBC 10.3 10.9* 9.3 12.4* 10.6*  NEUTROABS  --  9.0*  --  10.3*  --   HGB 8.8* 9.3* 9.8* 9.0* 7.9*  HCT 25.7* 28.8* 29.5* 26.8* 23.6*  MCV 85.7 89.7 87.0 88.2 86.4  PLT 204 235 264 298 294   Blood Culture    Component Value Date/Time   SDES BLOOD RIGHT HAND 07/20/2023 1742   SPECREQUEST  07/20/2023 1742    BOTTLES DRAWN AEROBIC ONLY Blood Culture results may not be optimal due to an inadequate volume of blood received in culture bottles   CULT  07/20/2023 1742    NO GROWTH < 12 HOURS Performed at [facility], [address], [place], [place] [address]    REPTSTATUS PENDING 07/20/2023 1742    CBG: Recent Labs  Lab 07/20/23 0911 07/20/23 1149 07/20/23 1728 07/20/23 2042 07/21/23 0808  GLUCAP 143* 158* 146* 194* 104*   Studies/Results: IR Fluoro Guide Ndl Plmt / BX Result Date: 07/20/2023 INDICATION: Left-sided epidural fluid collection at the L3-4 level  level impressing upon the thecal sac, compatible with epidural abscess/phlegmon. There is associated enhancement of several left nerve roots.  Large left multilocular paraspinous abscess. EXAM: LEFT LUMBAR PARASPINAL ASPIRATION UNDER FLUOROSCOPY MEDICATIONS: Lidocaine  1% subcutaneous ANESTHESIA/SEDATION: Intravenous Fentanyl  25mcg and Versed  1mg  were administered by RN during a total moderate (conscious) sedation time of 10 minutes; the patient's level of consciousness and physiological / cardiorespiratory status were monitored continuously by radiology RN under my direct supervision. PROCEDURE: Informed written consent was obtained from the patient after a thorough discussion of the procedural risks, benefits and alternatives. All questions were addressed. Maximal Sterile Barrier Technique was utilized including caps, mask, sterile gowns, sterile gloves, sterile drape, hand hygiene and skin antiseptic. A timeout was performed prior to the initiation of the procedure.  Patient placed right lateral decubitus. The appropriate interspace was identified under fluoroscopy, corresponding to previous cross-sectional imaging. An appropriate skin entry site was determined. After local infiltration with 1% lidocaine , a 18 gauge percutaneous entry needle was advanced into the left paraspinal soft tissues posterior to the left L3-4 facet. Needle tip position within the interspace confirmed on biplane images. 1.1 mL purulent fluid were aspirated, sent for the requested laboratory studies. The patient tolerated the procedure well. FLUOROSCOPY TIME:  Radiation Exposure Index (as provided by the fluoroscopic device): 7.3 mGy air Kerma COMPLICATIONS: None immediate. IMPRESSION: Technically successful left lumbar paraspinal abscess aspiration under fluoroscopy. Electronically Signed   By: JONETTA Faes M.D.   On: 07/20/2023 17:07   US  LT UPPER EXTREM LTD SOFT TISSUE NON VASCULAR Result Date: 07/20/2023 CLINICAL DATA:  Infection at AV fistula site. EXAM: ULTRASOUND left UPPER EXTREMITY LIMITED TECHNIQUE: Ultrasound examination of the upper extremity soft tissues was performed in the area of clinical concern. COMPARISON:  None Available. FINDINGS: AV fistula is visualized. No soft tissue edema or organized fluid collection. IMPRESSION: No soft tissue edema or organized fluid collection to suggest abscess. Electronically Signed   By: Newell Eke M.D.   On: 07/20/2023 09:15   MR Lumbar Spine W Wo Contrast Addendum Date: 07/19/2023 ADDENDUM REPORT: 07/19/2023 18:25 ADDENDUM: These results were called by telephone at the time of interpretation on 07/19/2023 at 6:24 pm to provider Dr. Marsha Ada, who verbally acknowledged these results. Electronically Signed   By: Evalene Coho M.D.   On: 07/19/2023 18:25   Result Date: 07/19/2023 CLINICAL DATA:  Myelopathy, acute, lumbar spine concern for epidural abscess EXAM: MRI LUMBAR SPINE WITHOUT AND WITH CONTRAST TECHNIQUE: Multiplanar and multiecho  pulse sequences of the lumbar spine were obtained without and with intravenous contrast. CONTRAST:  7mL GADAVIST  GADOBUTROL  1 MMOL/ML IV SOLN COMPARISON:  None Available. FINDINGS: Segmentation:  Standard. Alignment:  Physiologic. Vertebrae: Vertebral bodies are diffusely heterogeneous in signal intensity. There are discogenic reactive changes also present within the endplates at L2-3 and L3-4. Conus medullaris and cauda equina: Conus extends to the T12-L1 level. The conus appears normal. There is central crowding of the nerve roots at L3-4 secondary to an epidural fluid collection. Paraspinal and other soft tissues: There is a left posterolateral epidural fluid collection at the L3-4 level, measuring approximately 12 x 4 x 23 mm. It impresses upon the thecal sac and displaces the nerve roots of the cauda equina anteriorly and laterally to the right. There is enhancement of numerous nerve roots on the left. There is also a multilocular peripherally enhancing fluid collection in the left paraspinous musculature, measuring approximately 3.0 x 1.8 x 6.7 cm. There is increased T2 signal within the left  facet joint, but no convincing evidence of osteomyelitis. Disc levels: There is mild disc space narrowing and mild disc bulging at L2-3 and L4-5, without significant spinal canal stenosis at either level. IMPRESSION: 1. There is a left-sided epidural fluid collection at the L3-4 level level impressing upon the thecal sac, compatible with epidural abscess/phlegmon. There is associated enhancement of several left nerve roots. 2. Large left multilocular paraspinous abscess. Electronically Signed: By: Evalene Coho M.D. On: 07/19/2023 18:10   DG Chest Port 1 View Result Date: 07/19/2023 CLINICAL DATA:  Questionable sepsis - evaluate for abnormality EXAM: PORTABLE CHEST - 1 VIEW COMPARISON:  None available. FINDINGS: No focal airspace consolidation, pleural effusion, or pneumothorax. Moderate cardiomegaly. Aortic  atherosclerosis. No acute fracture or destructive lesions. Multilevel thoracic osteophytosis. IMPRESSION: Cardiomegaly.  No pneumonia or pulmonary edema. Electronically Signed   By: Rogelia Myers M.D.   On: 07/19/2023 14:01    Medications:   ceFAZolin  (ANCEF ) IV 1 g (07/20/23 1745)    (feeding supplement) PROSource Plus  30 mL Oral BID BM   acetaminophen   1,000 mg Oral TID   amLODipine   5 mg Oral Daily   aspirin  EC  81 mg Oral Daily   [START ON 07/22/2023] darbepoetin (ARANESP ) injection - DIALYSIS  200 mcg Subcutaneous Q Sun-1800   heparin  injection (subcutaneous)  5,000 Units Subcutaneous Q8H   insulin  aspart  0-15 Units Subcutaneous TID WC   insulin  glargine-yfgn  20 Units Subcutaneous QHS   isosorbide  mononitrate  30 mg Oral Once per day on Sunday Tuesday Thursday Saturday   methocarbamol   500 mg Oral TID   metoprolol  succinate  50 mg Oral Daily   ondansetron  (ZOFRAN ) IV  4 mg Intravenous Once   rosuvastatin   40 mg Oral Daily    Dialysis Orders: TTS - [facility] 4 hours, 450/A1.5, EDW 76kg, 2K/2.5Ca bath, AVF, no heparin  - Mircera 150mcg IV q 2 weeks (ordered, not get given) - last got Aranesp  on 7/13 - Hectoral 1mcg IV q HD   Assessment/Plan:  MSSA bacteremia with associated LS epidural phlegmon and paraspinal abscess: Started on Cefazolin . IR aspirated yesterday with initial culture showing gram+ bacteria.  AVF US  without abscess. ID & Neurosurgery consulted. TEE recommended but requires EGD prior to ensure a candidate due to Hx esophagitis with focal necrosis. ESRD:  Usual TTS schedule - HD today per regular schedule. AVF bleeding - d/t removing scab.  seen by Vascular, stitch placed. No further bleeding.  Advised not to scratch AVF.  Fistulogram completed earlier this week. Appreciate Vascular assistance.    Hypertension/volume: BP elevated. Continue home meds. UF as tolerated.  Anemia: Hgb down to 7.9 - resume ESA q Sunday this admit.  No IV iron  due to infection.    Metabolic bone disease: CorrCa/Phos ok - continue home meds.  Nutrition:  Alb low, adding protein supplements. T2DM Esophagitis - Needs EGD prior to TEE. Per PMD  Manuelita Labella, PA-C [facility] 07/21/2023,9:50 AM  LOS: 2 days    Seen and examined independently.  Agree with note and exam as documented above by physician extender and as noted here.  He refused labs this AM   General adult male in bed uncomfortable secondary to pain  HEENT normocephalic atraumatic extraocular movements intact sclera anicteric Neck supple trachea midline Lungs clear to auscultation bilaterally normal work of breathing at rest on room air Heart S1S2 no rub Abdomen soft nontender nondistended Extremities no edema  Psych normal mood and affect Neuro - alert and oriented  x 3 provides hx and follows commands Access LUE AVF with bruit and thrill    # Epidural abscess  - Neurosurgery was consulted - Status post drainage of fluid collection and culture with IR on 7/18 - Antibiotics per primary team   # MSSA bacteremia - Antibiotics per primary team - Note cardiology was consulted to arrange for a TEE.  They have requested a GI work-up prior   # ESRD  - on HD TTS - await AM labs    # hemorrhage of left AVF - this is the second event in a week  - s/p stitch with VVS on 7/18 and s/p fistulogram earlier this week  - spoke with vascular surgery - no concern for stenosis.  They recommend that the patient to stop picking off his scab.  He is going to leave the pressure dressing on after treatment and see if this makes a difference   # HTN  - Acceptable control  - optimize volume with HD   # Anemia of CKD  - acute blood loss as well  - on aranesp  200 mcg weekly on Sundays; iron  sats high  - PRBC's per primary team  - await AM labs   # Metabolic bone disease  - continue home regimen     Katheryn JAYSON Saba, MD 07/21/2023  12:37 PM

## 2023-07-21 NOTE — Plan of Care (Signed)
  Problem: Coping: Goal: Ability to adjust to condition or change in health will improve Outcome: Progressing   Problem: Fluid Volume: Goal: Ability to maintain a balanced intake and output will improve Outcome: Progressing   Problem: Health Behavior/Discharge Planning: Goal: Ability to identify and utilize available resources and services will improve Outcome: Progressing Goal: Ability to manage health-related needs will improve Outcome: Progressing   Problem: Metabolic: Goal: Ability to maintain appropriate glucose levels will improve Outcome: Progressing   Problem: Nutritional: Goal: Maintenance of adequate nutrition will improve Outcome: Progressing

## 2023-07-21 NOTE — Consult Note (Signed)
 Inpatient Consultation   Referring Provider:      Primary Care Physician:  Bernie Lamar PARAS, MD Primary Gastroenterologist:   Dr. Glendia Holt      Reason for Consultation:    History of LA grade D esophagitis, EGD prior to TEE         HPI  Justin [name] is a 73 y.o. male with medical history significant of ESRD (iHD on TTS), HFpEF, CAD, AS, and HTN, HLD, DM2 admitted 07/19/2023 with MSSA bacteremia after previous admission for HD fistula bleeding for which he underwent fistulogram and angioplasty with IR 07/16/2023.    Patient is being evaluated for endocarditis.  GI consultation is requested for EGD prior to TEE.  Justin [name] was previously seen by our GI inpatient team  02/2023 for symptoms of globus sensation and abnormal barium swallow study.  He underwent EGD showing LA grade D esophagitis, gastritis, and a 10 mm submucosal nodule in the second portion of the duodenum.  Gastric biopsies showed reactive gastropathy without H. pylori.  Esophageal biopsies showed active esophagitis with focal necrosis and marked reactive epithelial changes.  Duodenal biopsies showed foveolar metaplasia and Brunner's gland hyperplasia consistent with peptic duodenitis.  He was advised to take Protonix  40 mg p.o. twice daily x 12 weeks.  Recommended for follow-up EGD after 12 weeks of treatment to confirm healing of esophagitis but this did not occur.  Today's interview, he denies symptoms of GERD, regurgitation, dysphagia or odynophagia.  No abdominal pain, nausea or vomiting.  He does have a history of iron  deficiency anemia.  Colonoscopy performed in 2021 showed diverticulosis and 2 polyps ranging 7 to 10 mm.  Pathology report shows tubular adenomas.  In the setting of ESRD on hemodialysis and multiple medical problems, anemia is likely multifactorial.  He is on aspirin  and receiving subcu heparin  in the hospital but no long-term anticoagulants  Past Medical History:  Diagnosis Date   BPH (benign  prostatic hyperplasia)    Diabetes (HCC)    Dysuria    Erectile dysfunction    ESRD on hemodialysis (HCC)    History of chronic CHF 11/19/2019   Hyperlipemia    Hypertension    Personal history of COVID-19 11/19/2019   Pinna disorder, left    Wears glasses     Past Surgical History:  Procedure Laterality Date   A/V FISTULAGRAM Left 07/16/2023   Procedure: A/V Fistulagram;  Surgeon: Serene Gaile LELON, MD;  Location: [facility] INVASIVE CV LAB;  Service: Cardiovascular;  Laterality: Left;   A/V SHUNT INTERVENTION N/A 04/16/2023   Procedure: A/V SHUNT INTERVENTION;  Surgeon: Norine Manuelita LABOR, MD;  Location: [facility] INVAS[facility] CV [facility];  Service: Cardiovascular;  Laterality: N/A;   AV FISTULA PLACEMENT Left 04/30/2018   Procedure: CREATION OF RADIOCEPHALIC ARTERIOVENOUS FISTULA LEFT ARM;  Surgeon: Harvey Carlin BRAVO, MD;  Location: [facility] OR;  Service: Vascular;  Laterality: Left;   AV FISTULA PLACEMENT Left 08/28/2018   Procedure: ARTERIOVENOUS (AV) BRACHIOCEPHALIC FISTULA CREATION LEFT UPPER ARM;  Surgeon: Gretta Lonni PARAS, MD;  Location: [facility] OR;  Service: Vascular;  Laterality: Left;   BIOPSY  02/24/2023   Procedure: BIOPSY;  Surgeon: Federico Rosario BROCKS, MD;  Location: [facility] ENDOSCOPY;  Service: Gastroenterology;;   ESOPHAGOGASTRODUODENOSCOPY (EGD) WITH PROPOFOL  N/A 02/24/2023   Procedure: ESOPHAGOGASTRODUODENOSCOPY (EGD) WITH PROPOFOL ;  Surgeon: Federico Rosario BROCKS, MD;  Location: [facility] ENDOSCOPY;  Service: Gastroenterology;  Laterality: N/A;  Patint is s/p right hip ORIF   INTRAMEDULLARY (IM) NAIL INTERTROCHANTERIC Right 02/17/2023  Procedure: INTRAMEDULLARY (IM) NAIL INTERTROCHANTERIC;  Surgeon: Sherida Adine BROCKS, MD;  Location: [facility] OR;  Service: Orthopedics;  Laterality: Right;   IR FLUORO GUIDE CV LINE RIGHT  04/29/2018   IR FLUORO GUIDED NEEDLE PLC ASPIRATION/INJECTION LOC  07/20/2023   IR US  GUIDE VASC ACCESS RIGHT  04/29/2018   RIGHT/LEFT HEART CATH AND CORONARY ANGIOGRAPHY N/A 05/01/2018   Procedure: RIGHT/LEFT HEART  CATH AND CORONARY ANGIOGRAPHY;  Surgeon: Claudene Victory ORN, MD;  Location: [facility]VAS[facility] CV [facility];  Service: Cardiovascular;  Laterality: N/A;   RIGHT/LEFT HEART CATH AND CORONARY ANGIOGRAPHY N/A 09/29/2022   Procedure: RIGHT/LEFT HEART CATH AND CORONARY ANGIOGRAPHY;  Surgeon: Wonda Sharper, MD;  Location: [facility] INVASIVE CV LAB;  Service: Cardiovascular;  Laterality: N/A;   subdural hematoma Right 07/2020    Family History  Problem Relation Age of Onset   Stroke Mother    Diabetes Father      Social History   Tobacco Use   Smoking status: Never   Smokeless tobacco: Never  Vaping Use   Vaping status: Never Used  Substance Use Topics   Alcohol  use: Not Currently   Drug use: Never    Prior to Admission medications   Medication Sig Start Date End Date Taking? Authorizing Provider  acetaminophen  (TYLENOL ) 500 MG tablet Take 2 tablets (1,000 mg total) by mouth every 6 (six) hours as needed for mild pain (pain score 1-3), fever or headache. 02/23/23  Yes Laurence Locus, DO  amLODipine  (NORVASC ) 5 MG tablet Take 5 mg by mouth daily.   Yes [provider]  aspirin  EC 81 MG tablet Take 81 mg by mouth daily. Swallow whole.   Yes [provider]  cyanocobalamin  (VITAMIN B12) 1000 MCG tablet Take 1,000 mcg by mouth daily.   Yes [provider]  ferric citrate  (AURYXIA ) 1 GM 210 MG(Fe) tablet Take 630 mg by mouth 3 (three) times daily with meals.   Yes [provider]  hydrALAZINE  (APRESOLINE ) 25 MG tablet Take 1 tablet (25 mg total) by mouth 3 (three) times daily. Take 50 mg 3 times daily on M, W, F(dialysis days) Take 50 mg twice daily on Tu, Th, Sat, Sun(non-dialysis days) Patient taking differently: Take 50-150 mg by mouth See admin instructions. Take two tablets (50 mg) 2 times daily on M, W, Friday Sun(non dialysis days) Take 150 mg twice daily on Tu, Th, Sat,(dialysis days) 07/18/23  Yes Rashid, Farhan, MD  insulin  aspart protamine  - aspart (NOVOLOG  70/30 FLEXPEN)  (70-30) 100 UNIT/ML FlexPen Inject 20 Units into the skin at bedtime.   Yes [provider]  isosorbide  mononitrate (IMDUR ) 30 MG 24 hr tablet Take 1 tablet (30 mg total) by mouth 4 (four) times a week. Mon, Wed, Fri, Sun (non-dialysis days) 10/21/22  Yes [name], [name], MD  methocarbamol  (ROBAXIN ) 500 MG tablet Take 1 tablet (500 mg total) by mouth every 8 (eight) hours as needed for muscle spasms. 07/18/23  Yes Rashid, Farhan, MD  metoprolol  succinate (TOPROL -XL) 50 MG 24 hr tablet Take 50 mg by mouth daily.   Yes [provider]  ondansetron  (ZOFRAN -ODT) 4 MG disintegrating tablet Take 1 tablet by mouth. 07/12/23  Yes [provider]  oxyCODONE -acetaminophen  (PERCOCET/ROXICET) 5-325 MG tablet Take 1 tablet by mouth every 8 (eight) hours as needed for severe pain (pain score 7-10). 07/18/23  Yes Rashid, Farhan, MD  rosuvastatin  (CRESTOR ) 40 MG tablet Take 40 mg by mouth daily. 01/22/22  Yes [provider]  amLODipine  (NORVASC ) 10 MG tablet Take  0.5 tablets (5 mg total) by mouth daily. Patient not taking: Reported on 07/19/2023 07/18/23   Rashid, Farhan, MD  nitroGLYCERIN  (NITROSTAT ) 0.4 MG SL tablet Place 0.4 mg under the tongue every 5 (five) minutes as needed for chest pain.    [provider]    Current Facility-Administered Medications  Medication Dose Route Frequency Provider Last Rate Last Admin   (feeding supplement) PROSource Plus liquid 30 mL  30 mL Oral BID BM Stovall, Kathryn R, PA-C       acetaminophen  (TYLENOL ) tablet 1,000 mg  1,000 mg Oral TID Georgina Basket, MD   1,000 mg at 07/21/23 9187   amLODipine  (NORVASC ) tablet 5 mg  5 mg Oral Daily Georgina Basket, MD   5 mg at 07/21/23 9188   aspirin  EC tablet 81 mg  81 mg Oral Daily Georgina Basket, MD   81 mg at 07/21/23 0811   ceFAZolin  (ANCEF ) IVPB 1 g/50 mL premix  1 g Intravenous Q24H Paytes, Austin A, RPH 100 mL/hr at 07/20/23 1745 1 g at 07/20/23 1745   [START ON 07/22/2023] Darbepoetin Alfa   (ARANESP ) injection 200 mcg  200 mcg Subcutaneous Q Sun-1800 Jerrye Katheryn BROCKS, MD       heparin  injection 5,000 Units  5,000 Units Subcutaneous Q8H Georgina Basket, MD   5,000 Units at 07/21/23 0540   HYDROmorphone  (DILAUDID ) injection 0.4 mg  0.4 mg Intravenous Q4H PRN Mdala-Gausi, Masiku Agatha, MD       insulin  aspart (novoLOG ) injection 0-15 Units  0-15 Units Subcutaneous TID WC Georgina Basket, MD   2 Units at 07/19/23 1811   insulin  glargine-yfgn (SEMGLEE ) injection 20 Units  20 Units Subcutaneous QHS Georgina Basket, MD   20 Units at 07/20/23 2144   isosorbide  mononitrate (IMDUR ) 24 hr tablet 30 mg  30 mg Oral Once per day on Sunday Tuesday Thursday Saturday Georgina Basket, MD   30 mg at 07/21/23 9188   methocarbamol  (ROBAXIN ) tablet 500 mg  500 mg Oral TID Georgina Basket, MD   500 mg at 07/21/23 9187   metoprolol  succinate (TOPROL -XL) 24 hr tablet 50 mg  50 mg Oral Daily Georgina Basket, MD   50 mg at 07/21/23 9188   ondansetron  (ZOFRAN ) injection 4 mg  4 mg Intravenous Once Kommor, Madison, MD       oxyCODONE  (Oxy IR/ROXICODONE ) immediate release tablet 5 mg  5 mg Oral Q4H PRN Georgina Basket, MD   5 mg at 07/21/23 9187   rosuvastatin  (CRESTOR ) tablet 40 mg  40 mg Oral Daily Moore, Willie, MD   40 mg at 07/21/23 9188    Allergies as of 07/19/2023   (No Known Allergies)    GI Review of Symptoms Significant for None. Otherwise negative.  General Review of Systems  Review of systems is significant for the pertinent positives and negatives as listed per the HPI.  Full ROS is otherwise negative.    Physical Exam  Vital signs in last 24 hours: Temp:  [97.6 F (36.4 C)-98.5 F (36.9 C)] 98.2 F (36.8 C) (07/19 1345) Pulse Rate:  [55-65] 65 (07/19 1345) Resp:  [13-20] 18 (07/19 1345) BP: (121-173)/(61-92) 155/79 (07/19 1345) SpO2:  [95 %-100 %] 100 % (07/19 0826) Weight:  [75.5 kg] 75.5 kg (07/19 1345) Last BM Date : 07/16/23 General: Alert, resting in bed no distress Head:  Normocephalic and  atraumatic. Eyes:   Anicteric sclerae Lungs: Respirations even and unlabored. Lungs clear to auscultation bilaterally.   No wheezes, crackles, or rhonchi.  Heart: Normal S1,  S2. No MRG. Regular rate and rhythm. No peripheral edema, cyanosis or pallor.  Abdomen:  Soft, nondistended, nontender. No rebound or guarding. Normal bowel sounds. No appreciable masses or hepatomegaly. Rectal:   Deferred   Lab Results Recent Labs    07/19/23 1134 07/20/23 0409 07/21/23 1307  WBC 12.4* 10.6* 9.6  HGB 9.0* 7.9* 8.1*  HCT 26.8* 23.6* 23.9*  PLT 298 294 321   BMET Recent Labs    07/19/23 1134 07/20/23 0409  NA 129* 129*  K 4.1 4.0  CL 89* 90*  CO2 23 23  GLUCOSE 153* 141*  BUN 34* 41*  CREATININE 8.49* 9.64*  CALCIUM  8.1* 7.6*   LFT Recent Labs    07/19/23 1134  PROT 6.6  ALBUMIN  2.7*  AST 49*  ALT 81*  ALKPHOS 88  BILITOT 0.7   PT/INR Recent Labs    07/19/23 1134  LABPROT 16.5*  INR 1.3*    Radiographic Studies IR Fluoro Guide Ndl Plmt / BX Result Date: 07/20/2023 INDICATION: Left-sided epidural fluid collection at the L3-4 level level impressing upon the thecal sac, compatible with epidural abscess/phlegmon. There is associated enhancement of several left nerve roots.  Large left multilocular paraspinous abscess. EXAM: LEFT LUMBAR PARASPINAL ASPIRATION UNDER FLUOROSCOPY MEDICATIONS: Lidocaine  1% subcutaneous ANESTHESIA/SEDATION: Intravenous Fentanyl  25mcg and Versed  1mg  were administered by RN during a total moderate (conscious) sedation time of 10 minutes; the patient's level of consciousness and physiological / cardiorespiratory status were monitored continuously by radiology RN under my direct supervision. PROCEDURE: Informed written consent was obtained from the patient after a thorough discussion of the procedural risks, benefits and alternatives. All questions were addressed. Maximal Sterile Barrier Technique was utilized including caps, mask, sterile gowns, sterile  gloves, sterile drape, hand hygiene and skin antiseptic. A timeout was performed prior to the initiation of the procedure. Patient placed right lateral decubitus. The appropriate interspace was identified under fluoroscopy, corresponding to previous cross-sectional imaging. An appropriate skin entry site was determined. After local infiltration with 1% lidocaine , a 18 gauge percutaneous entry needle was advanced into the left paraspinal soft tissues posterior to the left L3-4 facet. Needle tip position within the interspace confirmed on biplane images. 1.1 mL purulent fluid were aspirated, sent for the requested laboratory studies. The patient tolerated the procedure well. FLUOROSCOPY TIME:  Radiation Exposure Index (as provided by the fluoroscopic device): 7.3 mGy air Kerma COMPLICATIONS: None immediate. IMPRESSION: Technically successful left lumbar paraspinal abscess aspiration under fluoroscopy. Electronically Signed   By: JONETTA Faes M.D.   On: 07/20/2023 17:07   US  LT UPPER EXTREM LTD SOFT TISSUE NON VASCULAR Result Date: 07/20/2023 CLINICAL DATA:  Infection at AV fistula site. EXAM: ULTRASOUND left UPPER EXTREMITY LIMITED TECHNIQUE: Ultrasound examination of the upper extremity soft tissues was performed in the area of clinical concern. COMPARISON:  None Available. FINDINGS: AV fistula is visualized. No soft tissue edema or organized fluid collection. IMPRESSION: No soft tissue edema or organized fluid collection to suggest abscess. Electronically Signed   By: Newell Eke M.D.   On: 07/20/2023 09:15   MR Lumbar Spine W Wo Contrast Addendum Date: 07/19/2023 ADDENDUM REPORT: 07/19/2023 18:25 ADDENDUM: These results were called by telephone at the time of interpretation on 07/19/2023 at 6:24 pm to provider Dr. Marsha Ada, who verbally acknowledged these results. Electronically Signed   By: Evalene Coho M.D.   On: 07/19/2023 18:25   Result Date: 07/19/2023 CLINICAL DATA:  Myelopathy, acute,  lumbar spine concern for epidural abscess EXAM: MRI LUMBAR  SPINE WITHOUT AND WITH CONTRAST TECHNIQUE: Multiplanar and multiecho pulse sequences of the lumbar spine were obtained without and with intravenous contrast. CONTRAST:  7mL GADAVIST  GADOBUTROL  1 MMOL/ML IV SOLN COMPARISON:  None Available. FINDINGS: Segmentation:  Standard. Alignment:  Physiologic. Vertebrae: Vertebral bodies are diffusely heterogeneous in signal intensity. There are discogenic reactive changes also present within the endplates at L2-3 and L3-4. Conus medullaris and cauda equina: Conus extends to the T12-L1 level. The conus appears normal. There is central crowding of the nerve roots at L3-4 secondary to an epidural fluid collection. Paraspinal and other soft tissues: There is a left posterolateral epidural fluid collection at the L3-4 level, measuring approximately 12 x 4 x 23 mm. It impresses upon the thecal sac and displaces the nerve roots of the cauda equina anteriorly and laterally to the right. There is enhancement of numerous nerve roots on the left. There is also a multilocular peripherally enhancing fluid collection in the left paraspinous musculature, measuring approximately 3.0 x 1.8 x 6.7 cm. There is increased T2 signal within the left facet joint, but no convincing evidence of osteomyelitis. Disc levels: There is mild disc space narrowing and mild disc bulging at L2-3 and L4-5, without significant spinal canal stenosis at either level. IMPRESSION: 1. There is a left-sided epidural fluid collection at the L3-4 level level impressing upon the thecal sac, compatible with epidural abscess/phlegmon. There is associated enhancement of several left nerve roots. 2. Large left multilocular paraspinous abscess. Electronically Signed: By: Evalene Coho M.D. On: 07/19/2023 18:10    Endoscopic Studies     EGD 02/2023 A grade D esophagitis, gastritis, and a 10 mm submucosal nodule in the second portion of the duodenum.   Gastric  biopsies showed reactive gastropathy without H. pylori.   Esophageal biopsies showed active esophagitis with focal necrosis and marked reactive epithelial changes.   Duodenal biopsies showed foveolar metaplasia and Brunner's gland hyperplasia consistent with peptic duodenitis.   Clinical Impression   73 year old male with medical history significant of ESRD (iHD on TTS), HFpEF, CAD, AS, and HTN, HLD, DM2 admitted 07/19/2023 with MSSA bacteremia after previous admission for HD fistula bleeding for which he underwent fistulogram and angioplasty with IR 07/16/2023.    Patient is being evaluated for endocarditis.  GI consultation is requested for EGD prior to TEE.   He has a prior history of globus sensation and abnormal barium swallow in February 2025.  EGD at that time showed LA grade D esophagitis, gastritis and a submucosal nodule in the second portion of the duodenum.  Biopsies negative for H. pylori.  He was managed with twice daily PPI but did not have a follow-up EGD. As he is due for follow-up EGD and there were plans for future TEE, will coordinate EGD for further evaluation.   Plan  Plan for EGD 07/23/2023; please make n.p.o. the evening before Given that the patient is asymptomatic from a GI perspective can hold off on starting PPI at this time and we will see what EGD shows.  Thank you for your kind consultation, we will continue to follow.  Inocente HERO Drayk Humbarger  07/21/2023, 1:48 PM  Inocente Hausen, MD [facility] Gastroenterology

## 2023-07-21 NOTE — Consult Note (Signed)
 Hospital Consult    Reason for Consult: Bleeding left aVF Requesting Physician: Hospital medicine MRN #:  [id number]  History of Present Illness: This is a 73 y.o. male with end-stage renal disease currently being dialyzed with left-sided brachial cephalic fistula.  He picked a scab on his fistula leading to bleeding.  Bleeding was uncontrolled, therefore vascular surgery was called.  When arrived to the patient's room and immediately apply pressure.  Suture supplies were sent for.  Past Medical History:  Diagnosis Date   BPH (benign prostatic hyperplasia)    Diabetes (HCC)    Dysuria    Erectile dysfunction    ESRD on hemodialysis (HCC)    History of chronic CHF 11/19/2019   Hyperlipemia    Hypertension    Personal history of COVID-19 11/19/2019   Pinna disorder, left    Wears glasses     Past Surgical History:  Procedure Laterality Date   A/V FISTULAGRAM Left 07/16/2023   Procedure: A/V Fistulagram;  Surgeon: Serene Gaile ORN, MD;  Location: [facility] INVASIVE CV [facility];  Service: Cardiovascular;  Laterality: Left;   A/V SHUNT INTERVENTION N/A 04/16/2023   Procedure: A/V SHUNT INTERVENTION;  Surgeon: Norine Manuelita LABOR, MD;  Location: [facility] INVAS[facility] CV [facility];  Service: Cardiovascular;  Laterality: N/A;   AV FISTULA PLACEMENT Left 04/30/2018   Procedure: CREATION OF RADIOCEPHALIC ARTERIOVENOUS FISTULA LEFT ARM;  Surgeon: Harvey Carlin BRAVO, MD;  Location: [facility] OR;  Service: Vascular;  Laterality: Left;   AV FISTULA PLACEMENT Left 08/28/2018   Procedure: ARTERIOVENOUS (AV) BRACHIOCEPHALIC FISTULA CREATION LEFT UPPER ARM;  Surgeon: Gretta Lonni PARAS, MD;  Location: [facility] OR;  Service: Vascular;  Laterality: Left;   BIOPSY  02/24/2023   Procedure: BIOPSY;  Surgeon: Federico Rosario BROCKS, MD;  Location: [facility] ENDOSCOPY;  Service: Gastroenterology;;   ESOPHAGOGASTRODUODENOSCOPY (EGD) WITH PROPOFOL  N/A 02/24/2023   Procedure: ESOPHAGOGASTRODUODENOSCOPY (EGD) WITH PROPOFOL ;  Surgeon: Federico Rosario BROCKS, MD;  Location:  [facility] ENDOSCOPY;  Service: Gastroenterology;  Laterality: N/A;  Patint is s/p right hip ORIF   INTRAMEDULLARY (IM) NAIL INTERTROCHANTERIC Right 02/17/2023   Procedure: INTRAMEDULLARY (IM) NAIL INTERTROCHANTERIC;  Surgeon: Sherida Adine BROCKS, MD;  Location: [facility] OR;  Service: Orthopedics;  Laterality: Right;   IR FLUORO GUIDE CV LINE RIGHT  04/29/2018   IR FLUORO GUIDED NEEDLE PLC ASPIRATION/INJECTION LOC  07/20/2023   IR US  GUIDE VASC ACCESS RIGHT  04/29/2018   RIGHT/LEFT HEART CATH AND CORONARY ANGIOGRAPHY N/A 05/01/2018   Procedure: RIGHT/LEFT HEART CATH AND CORONARY ANGIOGRAPHY;  Surgeon: Claudene Victory ORN, MD;  Location: [facility] CV [facility];  Service: Cardiovascular;  Laterality: N/A;   RIGHT/LEFT HEART CATH AND CORONARY ANGIOGRAPHY N/A 09/29/2022   Procedure: RIGHT/LEFT HEART CATH AND CORONARY ANGIOGRAPHY;  Surgeon: Wonda Sharper, MD;  Location: [facility] INVASIVE CV LAB;  Service: Cardiovascular;  Laterality: N/A;   subdural hematoma Right 07/2020    No Known Allergies  Prior to Admission medications   Medication Sig Start Date End Date Taking? Authorizing Provider  acetaminophen  (TYLENOL ) 500 MG tablet Take 2 tablets (1,000 mg total) by mouth every 6 (six) hours as needed for mild pain (pain score 1-3), fever or headache. 02/23/23  Yes Laurence Locus, DO  amLODipine  (NORVASC ) 5 MG tablet Take 5 mg by mouth daily.   Yes [provider]  aspirin  EC 81 MG tablet Take 81 mg by mouth daily. Swallow whole.   Yes [provider]  cyanocobalamin  (VITAMIN B12) 1000 MCG tablet Take 1,000 mcg by mouth daily.   Yes [provider]  ferric citrate  (AURYXIA ) 1 GM 210 MG(Fe) tablet Take 630 mg by mouth 3 (three) times daily with meals.   Yes [provider]  hydrALAZINE  (APRESOLINE ) 25 MG tablet Take 1 tablet (25 mg total) by mouth 3 (three) times daily. Take 50 mg 3 times daily on M, W, F(dialysis days) Take 50 mg twice daily on Tu, Th, Sat, Sun(non-dialysis days) Patient taking  differently: Take 50-150 mg by mouth See admin instructions. Take two tablets (50 mg) 2 times daily on M, W, Friday Sun(non dialysis days) Take 150 mg twice daily on Tu, Th, Sat,(dialysis days) 07/18/23  Yes Rashid, Farhan, MD  insulin  aspart protamine  - aspart (NOVOLOG  70/30 FLEXPEN) (70-30) 100 UNIT/ML FlexPen Inject 20 Units into the skin at bedtime.   Yes [provider]  isosorbide  mononitrate (IMDUR ) 30 MG 24 hr tablet Take 1 tablet (30 mg total) by mouth 4 (four) times a week. Mon, Wed, Fri, Sun (non-dialysis days) 10/21/22  Yes [name], [name], MD  methocarbamol  (ROBAXIN ) 500 MG tablet Take 1 tablet (500 mg total) by mouth every 8 (eight) hours as needed for muscle spasms. 07/18/23  Yes Rashid, Farhan, MD  metoprolol  succinate (TOPROL -XL) 50 MG 24 hr tablet Take 50 mg by mouth daily.   Yes [provider]  ondansetron  (ZOFRAN -ODT) 4 MG disintegrating tablet Take 1 tablet by mouth. 07/12/23  Yes [provider]  oxyCODONE -acetaminophen  (PERCOCET/ROXICET) 5-325 MG tablet Take 1 tablet by mouth every 8 (eight) hours as needed for severe pain (pain score 7-10). 07/18/23  Yes Rashid, Farhan, MD  rosuvastatin  (CRESTOR ) 40 MG tablet Take 40 mg by mouth daily. 01/22/22  Yes [provider]  amLODipine  (NORVASC ) 10 MG tablet Take 0.5 tablets (5 mg total) by mouth daily. Patient not taking: Reported on 07/19/2023 07/18/23   Rashid, Farhan, MD  nitroGLYCERIN  (NITROSTAT ) 0.4 MG SL tablet Place 0.4 mg under the tongue every 5 (five) minutes as needed for chest pain.    [provider]    Social History   Socioeconomic History   Marital status: Divorced    Spouse name: Not on file   Number of children: 1   Years of education: Not on file   Highest education level: Not on file  Occupational History   Not on file  Tobacco Use   Smoking status: Never   Smokeless tobacco: Never  Vaping Use   Vaping status: Never Used  Substance and Sexual Activity    Alcohol  use: Not Currently   Drug use: Never   Sexual activity: Not Currently  Other Topics Concern   Not on file  Social History Narrative   Not on file   Social Drivers of Health   Financial Resource Strain: Low Risk  (07/13/2020)   Received from [facility]   Overall Financial Resource Strain (CARDIA)    Difficulty of Paying Living Expenses: Not very hard  Food Insecurity: No Food Insecurity (07/20/2023)   Hunger Vital Sign    Worried About Running Out of Food in the Last Year: Never true    Ran Out of Food in the Last Year: Never true  Transportation Needs: No Transportation Needs (07/20/2023)   PRAPARE - Administrator, Civil Service (Medical): No    Lack of Transportation (Non-Medical): No  Physical Activity: Not on file  Stress: Not on file  Social Connections: Socially Integrated (07/20/2023)   Social Connection and Isolation Panel    Frequency of Communication with Friends and Family: More than three  times a week    Frequency of Social Gatherings with Friends and Family: More than three times a week    Attends Religious Services: More than 4 times per year    Active Member of [employer] or Organizations: Yes    Attends Banker Meetings: 1 to 4 times per year    Marital Status: Living with partner  Intimate Partner Violence: Not At Risk (07/20/2023)   Humiliation, Afraid, Rape, and Kick questionnaire    Fear of Current or Ex-Partner: No    Emotionally Abused: No    Physically Abused: No    Sexually Abused: No    Family History  Problem Relation Age of Onset   Stroke Mother    Diabetes Father     ROS: Otherwise negative unless mentioned in HPI  Physical Examination  Vitals:   07/20/23 2040 07/21/23 0535  BP: 121/61 (!) 156/65  Pulse: 61 (!) 55  Resp: 18 20  Temp: 98.2 F (36.8 C) 97.6 F (36.4 C)  SpO2: 98% 97%   There is no height or weight on file to calculate BMI.  General:  WDWN in NAD Gait: Not observed HENT: WNL,  normocephalic Pulmonary: normal non-labored breathing, without Rales, rhonchi,  wheezing Cardiac: regular Abdomen:  soft, NT/ND, no masses Skin: without rashes Vascular Exam/Pulses: Bleeding from left arm AVF Extremities: without ischemic changes, without Gangrene , without cellulitis; with open wounds;  Musculoskeletal: no muscle wasting or atrophy  Neurologic: A&O X 3;  No focal weakness or paresthesias are detected; speech is fluent/normal Psychiatric:  The pt has Normal affect. Lymph:  Unremarkable  CBC    Component Value Date/Time   WBC 10.6 (H) 07/20/2023 0409   RBC 2.73 (L) 07/20/2023 0409   HGB 7.9 (L) 07/20/2023 0409   HGB 10.8 (L) 09/25/2022 0948   HCT 23.6 (L) 07/20/2023 0409   HCT 32.8 (L) 09/25/2022 0948   PLT 294 07/20/2023 0409   PLT 155 09/25/2022 0948   MCV 86.4 07/20/2023 0409   MCV 95 09/25/2022 0948   MCH 28.9 07/20/2023 0409   MCHC 33.5 07/20/2023 0409   RDW 14.3 07/20/2023 0409   RDW 13.8 09/25/2022 0948   LYMPHSABS 0.8 07/19/2023 1134   MONOABS 1.0 07/19/2023 1134   EOSABS 0.1 07/19/2023 1134   BASOSABS 0.1 07/19/2023 1134    BMET    Component Value Date/Time   NA 129 (L) 07/20/2023 0409   NA 135 09/25/2022 0945   K 4.0 07/20/2023 0409   CL 90 (L) 07/20/2023 0409   CO2 23 07/20/2023 0409   GLUCOSE 141 (H) 07/20/2023 0409   BUN 41 (H) 07/20/2023 0409   BUN 40 (H) 09/25/2022 0945   CREATININE 9.64 (H) 07/20/2023 0409   CALCIUM  7.6 (L) 07/20/2023 0409   CALCIUM  7.9 (L) 04/29/2018 1350   GFRNONAA 5 (L) 07/20/2023 0409   GFRAA 6 (L) 01/13/2019 0324    COAGS: Lab Results  Component Value Date   INR 1.3 (H) 07/19/2023   INR 1.6 (H) 07/13/2023   INR 1.1 02/16/2023     ASSESSMENT/PLAN: This is a 73 y.o. male with bleeding aVF.  The wound bed was assessed.  The skin appeared healthy, therefore a 3-0 Monocryl suture was placed in figure-of-eight fashion leading to hemostasis.  Upon removing pressure, patient had an excellent, palpable thrill  within the fistula.  He does not need fistulogram.  This was due to scab removal. The stitch will dissolve in the coming weeks. Can continue to use the  left-sided access.  Please call with questions or concerns arise.      Fonda FORBES Rim MD MS [facility] [phone] 07/21/2023  7:21 AM

## 2023-07-21 NOTE — Plan of Care (Signed)

## 2023-07-22 DIAGNOSIS — B9561 Methicillin susceptible Staphylococcus aureus infection as the cause of diseases classified elsewhere: Secondary | ICD-10-CM | POA: Diagnosis not present

## 2023-07-22 DIAGNOSIS — R7881 Bacteremia: Secondary | ICD-10-CM | POA: Diagnosis not present

## 2023-07-22 LAB — CULTURE, BLOOD (ROUTINE X 2): Special Requests: ADEQUATE

## 2023-07-22 LAB — CBC
HCT: 23.7 % — ABNORMAL LOW (ref 39.0–52.0)
Hemoglobin: 7.9 g/dL — ABNORMAL LOW (ref 13.0–17.0)
MCH: 28.8 pg (ref 26.0–34.0)
MCHC: 33.3 g/dL (ref 30.0–36.0)
MCV: 86.5 fL (ref 80.0–100.0)
Platelets: 354 K/uL (ref 150–400)
RBC: 2.74 MIL/uL — ABNORMAL LOW (ref 4.22–5.81)
RDW: 14.6 % (ref 11.5–15.5)
WBC: 8.7 K/uL (ref 4.0–10.5)
nRBC: 0 % (ref 0.0–0.2)

## 2023-07-22 LAB — RENAL FUNCTION PANEL
Albumin: 2.1 g/dL — ABNORMAL LOW (ref 3.5–5.0)
Anion gap: 13 (ref 5–15)
BUN: 27 mg/dL — ABNORMAL HIGH (ref 8–23)
CO2: 25 mmol/L (ref 22–32)
Calcium: 7.6 mg/dL — ABNORMAL LOW (ref 8.9–10.3)
Chloride: 94 mmol/L — ABNORMAL LOW (ref 98–111)
Creatinine, Ser: 7.23 mg/dL — ABNORMAL HIGH (ref 0.61–1.24)
GFR, Estimated: 7 mL/min — ABNORMAL LOW (ref >60)
Glucose, Bld: 154 mg/dL — ABNORMAL HIGH (ref 70–99)
Phosphorus: 5.9 mg/dL — ABNORMAL HIGH (ref 2.5–4.6)
Potassium: 4.2 mmol/L (ref 3.5–5.1)
Sodium: 132 mmol/L — ABNORMAL LOW (ref 135–145)

## 2023-07-22 LAB — GLUCOSE, CAPILLARY
Glucose-Capillary: 176 mg/dL — ABNORMAL HIGH (ref 70–99)
Glucose-Capillary: 115 mg/dL — ABNORMAL HIGH (ref 70–99)
Glucose-Capillary: 152 mg/dL — ABNORMAL HIGH (ref 70–99)
Glucose-Capillary: 183 mg/dL — ABNORMAL HIGH (ref 70–99)
Glucose-Capillary: 147 mg/dL — ABNORMAL HIGH (ref 70–99)

## 2023-07-22 MED ORDER — OXYCODONE HCL 5 MG PO TABS
ORAL_TABLET | ORAL | Status: AC
Start: 2023-07-22 — End: 2023-07-22
  Filled 2023-07-22: qty 1

## 2023-07-22 NOTE — Progress Notes (Signed)
 Progress Note   Patient: Justin [name] FMW:[id number] DOB: March 20, 1950 DOA: 07/19/2023     3 DOS: the patient was seen and examined on 07/22/2023   Brief hospital course: 73 year old man with PMH of ESRD on HD TTS, HFpEF, CAD, AAS, HTN, HLD, T2DM and recent admission from 7/11-7/16 for hemorrhaghic shock 2/2 bleeding fistula during HD who underwent fistulogram and angioplasty per IR on 7/14.  Patient was called back to the hospital the day after discharge due to positive blood cultures with MSSA growing from cultures drawn on 7/11 (prior admission).   Further evaluation has revealed a left-sided epidural fluid collection at the L3-L4 level.  Neurosurgery and interventional radiology consulted. Infectious disease, nephrology, vascular surgery also on consult.  Assessment and Plan:  Epidural abscess MSSA bacteremia Patient called back to hospital due to positive blood cultures. MRI of lumbar spine revealed epidural abscess. Infectious disease consulted.  Input appreciated. Neurosurgery consulted for drainage of abscess. Given no neurological complications, IR consult recommended for aspiration. IR has been consulted, fluoro-guided aspiration of left lumbar paraspinal process completed on 7/18 - Leukocytosis resolved, remains afebrile - Cardiology consulted for TTE, recommends EGD by GI due to history of esophagitis before for necrosis - GI consulted, plan for EGD on 7/21 - Continue cefazolin . - Repeat blood cultures no growth in in 48 hrs - Will follow-up cultures from spinal aspirate  ESRD on HD Patient on TTS hemodialysis schedule. He reports his last hemodialysis was on 7/16 while he was admitted. Nephrology consulted. Input appreciated. - Continue with hemodialysis per nephrology. - Had HD last night, pending repeat labs  Recent admission from bleeding fistula. S/p fistulogram and angioplasty on 7/14. Patient had some bleeding from his AV fistula on 7/18. Addressed by vascular  surgery. - Will continue to monitor.  HTN - BP stable with SBP in the 110-130s today - Continue home Norvasc , Imdur , metoprolol .  T2DM, controlled A1c was 5.3 on 7/17 - Fasting CBG of 115 this morning - Diabetic diet. - Long-acting, Premeal and sliding scale insulin .  Normocytic anemia Anemia of CKD - Hgb stable at 8.1 - Labs not drawn this morning - Transfuse if hemoglobin less than 7.  Hyponatremia Patient appears to be hyponatremic at baseline. - Pending renal function panel after HD last night      Subjective: Reports he was taken to HD in the middle of the night. HD went well but he did not get enough sleep. He has no acute concerns. Discussed plan for EGD tomorrow.  Physical Exam: Vitals:   07/22/23 0120 07/22/23 0146 07/22/23 0451 07/22/23 0935  BP: (!) 155/72 133/62 123/64 (!) 118/55  Pulse: 88 85 76 (!) 57  Resp: 19 18 18 19   Temp: (!) 97.4 F (36.3 C) 97.8 F (36.6 C) 98.1 F (36.7 C) 98.2 F (36.8 C)  TempSrc: Oral Oral    SpO2: 100% 99% 96% 97%  Weight: 76.2 kg     Height:       Physical Exam   General: Pleasant, chronically ill elderly man laying in bed. No acute distress. HEENT: [place]/AT. Anicteric sclera CV: RRR. Vascular murmur. No LE edema Pulmonary: Lungs CTAB. Normal effort. No wheezing or rales. Abdominal: Soft, nontender, nondistended. Normal bowel sounds. Extremities: Palpable radial and DP pulses. Normal ROM. Skin: Warm and dry. No obvious rash or lesions. Neuro: A&Ox3. Moves all extremities. Normal sensation to light touch. No focal deficit. Psych: Normal mood and affect HD Access: LUE AVF stable for palpable thrill   Data Reviewed:  Latest Ref Rng & Units 07/21/2023    1:07 PM 07/20/2023    4:09 AM 07/19/2023   11:34 AM  CBC  WBC 4.0 - 10.5 K/uL 9.6  10.6  12.4   Hemoglobin 13.0 - 17.0 g/dL 8.1  7.9  9.0   Hematocrit 39.0 - 52.0 % 23.9  23.6  26.8   Platelets 150 - 400 K/uL 321  294  298       Latest Ref Rng & Units  07/21/2023    1:07 PM 07/20/2023    4:09 AM 07/19/2023   11:34 AM  BMP  Glucose 70 - 99 mg/dL 99  858  846   BUN 8 - 23 mg/dL 51  41  34   Creatinine 0.61 - 1.24 mg/dL 88.91  0.35  1.50   Sodium 135 - 145 mmol/L 126  129  129   Potassium 3.5 - 5.1 mmol/L 4.0  4.0  4.1   Chloride 98 - 111 mmol/L 88  90  89   CO2 22 - 32 mmol/L 20  23  23    Calcium  8.9 - 10.3 mg/dL 7.3  7.6  8.1      Family Communication: No family at bedside  Disposition: Status is: Inpatient Remains inpatient appropriate because: On IV antibiotics for MSSA bacteremia, epidural abscess, needs EGD, TEE  Planned Discharge Destination: Home    Time spent: 35 minutes  Author: Claretta CHRISTELLA Alderman, MD 07/22/2023 10:07 AM  For on call review [email or web address].

## 2023-07-22 NOTE — Progress Notes (Signed)
 Phlebotomy went to draw patients labs. Patient refused. MD notified

## 2023-07-22 NOTE — Plan of Care (Signed)

## 2023-07-22 NOTE — Progress Notes (Addendum)
 Received patient in bed to unit.  Alert and oriented.  Informed consent signed and in chart.   TX duration: 2hours23min  Patient tolerated well.  Transported back to the room  Alert, without acute distress.  Hand-off given to patient's nurse.   Access used: fistura Access issues: none  Total UF removed: 2200 ml Medication(s) given: dilaudid   and oxy 5 mg Post HD VS: 155/72 Post HD weight: 76.2 kg   07/22/23 0120  Vitals  Temp (!) 97.4 F (36.3 C)  Temp Source Oral  BP (!) 155/72  MAP (mmHg) 94  BP Location Right Arm  BP Method Automatic  Patient Position (if appropriate) Lying  Pulse Rate 88  Pulse Rate Source Monitor  ECG Heart Rate 88  Resp 19  Weight 76.2 kg  Type of Weight Post-Dialysis  Oxygen  Therapy  SpO2 100 %  O2 Device Room Air  Patient Activity (if Appropriate) In bed  Pulse Oximetry Type Continuous  During Treatment Monitoring  Blood Flow Rate (mL/min) 0 mL/min  Arterial Pressure (mmHg) -0.4 mmHg  Venous Pressure (mmHg) -1.21 mmHg  TMP (mmHg) -51.51 mmHg  Ultrafiltration Rate (mL/min) 930 mL/min  Dialysate Flow Rate (mL/min) 0 ml/min  Duration of HD Treatment -hour(s) 2.69 hour(s)  Cumulative Fluid Removed (mL) per Treatment  2208.5  Post Treatment  Dialyzer Clearance Clear  Liters Processed 64.5  Fluid Removed (mL) 2200 mL  Tolerated HD Treatment Yes  Post-Hemodialysis Comments  (Requested to come 18 minutes ealry/ extreme back pain)  AVG/AVF Arterial Site Held (minutes) 10 minutes  AVG/AVF Venous Site Held (minutes) 10 minutes  Fistula / Graft Left Upper arm Arteriovenous fistula  Placement Date/Time: 08/28/18 9185   Orientation: Left  Access Location: (c) Upper arm  Access Type: Arteriovenous fistula  Site Condition No complications  Fistula / Graft Assessment Thrill;Present;Bruit  Status Deaccessed  Drainage Description Delores;Tan      [facility]

## 2023-07-22 NOTE — Progress Notes (Addendum)
 [facility] Progress Note   Subjective:   Patient seen and examined at bedside.  Pain a little better today at 7/10.  No other new complaints.  Tolerated dialysis well other than back pain.  No bleeding from fistula post treatment.   Objective Vitals:   07/22/23 0120 07/22/23 0146 07/22/23 0451 07/22/23 0935  BP: (!) 155/72 133/62 123/64 (!) 118/55  Pulse: 88 85 76 (!) 57  Resp: 19 18 18 19   Temp: (!) 97.4 F (36.3 C) 97.8 F (36.6 C) 98.1 F (36.7 C) 98.2 F (36.8 C)  TempSrc: Oral Oral    SpO2: 100% 99% 96% 97%  Weight: 76.2 kg     Height:       Physical Exam General:chronically ill appearing male in NAD Heart:RRR Lungs:CTAB, nml WOB on RA Abdomen:soft, NTND Extremities:no LE edema Dialysis Access: LU AVF +b/t, dressing in place, no blood present   [facility] Weights   07/21/23 1345 07/21/23 2149 07/22/23 0120  Weight: 75.5 kg 78.3 kg 76.2 kg    Intake/Output Summary (Last 24 hours) at 07/22/2023 1119 Last data filed at 07/22/2023 0858 Gross per 24 hour  Intake 790 ml  Output 2250 ml  Net -1460 ml    Additional Objective Labs: Basic Metabolic Panel: Recent Labs  Lab 07/18/23 0846 07/19/23 1134 07/20/23 0409 07/21/23 1307  NA 131* 129* 129* 126*  K 3.6 4.1 4.0 4.0  CL 90* 89* 90* 88*  CO2 28 23 23  20*  GLUCOSE 173* 153* 141* 99  BUN 23 34* 41* 51*  CREATININE 6.33* 8.49* 9.64* 11.08*  CALCIUM  8.5* 8.1* 7.6* 7.3*  PHOS 4.9*  --   --  9.1*   Liver Function Tests: Recent Labs  Lab 07/18/23 0846 07/19/23 1134 07/21/23 1307  AST  --  49*  --   ALT  --  81*  --   ALKPHOS  --  88  --   BILITOT  --  0.7  --   PROT  --  6.6  --   ALBUMIN  2.7* 2.7* 2.3*   CBC: Recent Labs  Lab 07/17/23 0420 07/18/23 0846 07/19/23 1134 07/20/23 0409 07/21/23 1307  WBC 10.9* 9.3 12.4* 10.6* 9.6  NEUTROABS 9.0*  --  10.3*  --   --   HGB 9.3* 9.8* 9.0* 7.9* 8.1*  HCT 28.8* 29.5* 26.8* 23.6* 23.9*  MCV 89.7 87.0 88.2 86.4 85.1  PLT 235 264 298 294 321    CBG: Recent Labs  Lab 07/21/23 0808 07/21/23 1204 07/21/23 1711 07/22/23 0144 07/22/23 0751  GLUCAP 104* 109* 167* 176* 115*    Studies/Results: IR Fluoro Guide Ndl Plmt / BX Result Date: 07/20/2023 INDICATION: Left-sided epidural fluid collection at the L3-4 level level impressing upon the thecal sac, compatible with epidural abscess/phlegmon. There is associated enhancement of several left nerve roots.  Large left multilocular paraspinous abscess. EXAM: LEFT LUMBAR PARASPINAL ASPIRATION UNDER FLUOROSCOPY MEDICATIONS: Lidocaine  1% subcutaneous ANESTHESIA/SEDATION: Intravenous Fentanyl  25mcg and Versed  1mg  were administered by RN during a total moderate (conscious) sedation time of 10 minutes; the patient's level of consciousness and physiological / cardiorespiratory status were monitored continuously by radiology RN under my direct supervision. PROCEDURE: Informed written consent was obtained from the patient after a thorough discussion of the procedural risks, benefits and alternatives. All questions were addressed. Maximal Sterile Barrier Technique was utilized including caps, mask, sterile gowns, sterile gloves, sterile drape, hand hygiene and skin antiseptic. A timeout was performed prior to the initiation of the procedure. Patient placed right  lateral decubitus. The appropriate interspace was identified under fluoroscopy, corresponding to previous cross-sectional imaging. An appropriate skin entry site was determined. After local infiltration with 1% lidocaine , a 18 gauge percutaneous entry needle was advanced into the left paraspinal soft tissues posterior to the left L3-4 facet. Needle tip position within the interspace confirmed on biplane images. 1.1 mL purulent fluid were aspirated, sent for the requested laboratory studies. The patient tolerated the procedure well. FLUOROSCOPY TIME:  Radiation Exposure Index (as provided by the fluoroscopic device): 7.3 mGy air Kerma COMPLICATIONS: None  immediate. IMPRESSION: Technically successful left lumbar paraspinal abscess aspiration under fluoroscopy. Electronically Signed   By: JONETTA Faes M.D.   On: 07/20/2023 17:07    Medications:   ceFAZolin  (ANCEF ) IV Stopped (07/21/23 1756)    (feeding supplement) PROSource Plus  30 mL Oral BID BM   acetaminophen   1,000 mg Oral TID   amLODipine   5 mg Oral Daily   aspirin  EC  81 mg Oral Daily   darbepoetin (ARANESP ) injection - DIALYSIS  200 mcg Subcutaneous Q Sun-1800   heparin  injection (subcutaneous)  5,000 Units Subcutaneous Q8H   insulin  aspart  0-15 Units Subcutaneous TID WC   insulin  glargine-yfgn  20 Units Subcutaneous QHS   isosorbide  mononitrate  30 mg Oral Once per day on Sunday Tuesday Thursday Saturday   methocarbamol   500 mg Oral TID   metoprolol  succinate  50 mg Oral Daily   ondansetron  (ZOFRAN ) IV  4 mg Intravenous Once   rosuvastatin   40 mg Oral Daily    Dialysis Orders: TTS - [place] 4 hours, 450/A1.5, EDW 76kg, 2K/2.5Ca bath, AVF, no heparin  - Mircera 150mcg IV q 2 weeks (ordered, not get given) - last got Aranesp  on 7/13 - Hectoral 1mcg IV q HD   Assessment/Plan:  MSSA bacteremia with associated LS epidural phlegmon and paraspinal abscess: Started on Cefazolin . IR aspirated yesterday with initial culture showing gram+ bacteria.  AVF US  without abscess. ID & Neurosurgery consulted. TEE recommended but requires EGD prior to ensure a candidate due to Hx esophagitis with focal necrosis. EGD planned for tomorrow. ESRD:  Usual TTS schedule - Next HD 07/24/23. AVF bleeding - d/t removing scab.  seen by Vascular, stitch placed. No further bleeding.  Advised not to scratch AVF, keep bandage in place between treatment if needed to help prevent scratching.  Important to hold manual compression for 10-2min post treatment.  Hypertension/volume: BP in goal. Continue home meds. UF as tolerated.  Anemia: Hgb down to 7.9 - resume ESA q Sunday this admit.  No IV iron  due to  infection.   Metabolic bone disease: CorrCa/Phos ok - continue home meds.  Nutrition:  Alb low, adding protein supplements. T2DM Esophagitis - EGD planned for tomorrow.   Manuelita Labella, PA-C [facility] 07/22/2023,11:19 AM  LOS: 3 days    Seen and examined independently.  Agree with note and exam as documented above by physician extender and as noted here.  I spoke with the patient's significant other via phone this morning to update her.   General adult male in bed uncomfortable secondary to pain  HEENT normocephalic atraumatic extraocular movements intact sclera anicteric Neck supple trachea midline Lungs clear to auscultation bilaterally normal work of breathing at rest on room air Heart S1S2 no rub Abdomen soft nontender nondistended Extremities no edema  Psych normal mood and affect Neuro - alert and oriented x 3 provides hx and follows commands Access LUE AVF with bruit and thrill    #  Epidural abscess  - Neurosurgery was consulted - Status post drainage of fluid collection and culture with IR on 7/18 - Antibiotics per primary team   # MSSA bacteremia - Antibiotics per primary team - Note cardiology was consulted to arrange for a TEE.  They have requested a GI work-up prior  - repeat blood cultures.  Cx's from 07/20/23 still positive    # ESRD  - on HD TTS - await AM labs    # hemorrhage of left AVF - this is the second event in a week  - s/p stitch with VVS on 7/18 and s/p fistulogram earlier this week  - spoke with vascular surgery - no concern for stenosis.  They recommend that the patient to stop picking off his scab.  He is going to leave the pressure dressing on after treatment and see if this makes a difference   # HTN  - Acceptable control  - optimize volume with HD   # Anemia of CKD  - acute blood loss as well  - on aranesp  200 mcg weekly on Sundays; iron  sats high  - PRBC's per primary team    Katheryn JAYSON Saba, MD 07/22/2023 12:00  PM

## 2023-07-23 ENCOUNTER — Encounter (HOSPITAL_COMMUNITY): Payer: Self-pay | Admitting: Hospitalist

## 2023-07-23 ENCOUNTER — Inpatient Hospital Stay (HOSPITAL_COMMUNITY): Admitting: Anesthesiology

## 2023-07-23 ENCOUNTER — Encounter (HOSPITAL_COMMUNITY)
Admission: EM | Disposition: A | Discharge status: Home / Self Care | Payer: Self-pay | Source: Home / Self Care | Attending: Student

## 2023-07-23 DIAGNOSIS — N186 End stage renal disease: Secondary | ICD-10-CM

## 2023-07-23 DIAGNOSIS — K2100 Gastro-esophageal reflux disease with esophagitis, without bleeding: Secondary | ICD-10-CM | POA: Diagnosis not present

## 2023-07-23 DIAGNOSIS — B9561 Methicillin susceptible Staphylococcus aureus infection as the cause of diseases classified elsewhere: Secondary | ICD-10-CM | POA: Diagnosis not present

## 2023-07-23 DIAGNOSIS — K449 Diaphragmatic hernia without obstruction or gangrene: Secondary | ICD-10-CM | POA: Diagnosis not present

## 2023-07-23 DIAGNOSIS — I5022 Chronic systolic (congestive) heart failure: Secondary | ICD-10-CM | POA: Diagnosis not present

## 2023-07-23 DIAGNOSIS — I132 Hypertensive heart and chronic kidney disease with heart failure and with stage 5 chronic kidney disease, or end stage renal disease: Secondary | ICD-10-CM

## 2023-07-23 DIAGNOSIS — R7881 Bacteremia: Secondary | ICD-10-CM | POA: Diagnosis not present

## 2023-07-23 HISTORY — PX: ESOPHAGOGASTRODUODENOSCOPY: SHX5428

## 2023-07-23 LAB — RENAL FUNCTION PANEL
Albumin: 2.3 g/dL — ABNORMAL LOW (ref 3.5–5.0)
Anion gap: 18 — ABNORMAL HIGH (ref 5–15)
BUN: 36 mg/dL — ABNORMAL HIGH (ref 8–23)
CO2: 22 mmol/L (ref 22–32)
Calcium: 7.5 mg/dL — ABNORMAL LOW (ref 8.9–10.3)
Chloride: 92 mmol/L — ABNORMAL LOW (ref 98–111)
Creatinine, Ser: 8.18 mg/dL — ABNORMAL HIGH (ref 0.61–1.24)
GFR, Estimated: 6 mL/min — ABNORMAL LOW (ref >60)
Glucose, Bld: 87 mg/dL (ref 70–99)
Phosphorus: 6.5 mg/dL — ABNORMAL HIGH (ref 2.5–4.6)
Potassium: 4.5 mmol/L (ref 3.5–5.1)
Sodium: 132 mmol/L — ABNORMAL LOW (ref 135–145)

## 2023-07-23 LAB — GLUCOSE, CAPILLARY
Glucose-Capillary: 92 mg/dL (ref 70–99)
Glucose-Capillary: 86 mg/dL (ref 70–99)
Glucose-Capillary: 88 mg/dL (ref 70–99)
Glucose-Capillary: 153 mg/dL — ABNORMAL HIGH (ref 70–99)
Glucose-Capillary: 149 mg/dL — ABNORMAL HIGH (ref 70–99)
Glucose-Capillary: 188 mg/dL — ABNORMAL HIGH (ref 70–99)

## 2023-07-23 LAB — CBC
HCT: 25.2 % — ABNORMAL LOW (ref 39.0–52.0)
Hemoglobin: 8.3 g/dL — ABNORMAL LOW (ref 13.0–17.0)
MCH: 28.8 pg (ref 26.0–34.0)
MCHC: 32.9 g/dL (ref 30.0–36.0)
MCV: 87.5 fL (ref 80.0–100.0)
Platelets: 384 K/uL (ref 150–400)
RBC: 2.88 MIL/uL — ABNORMAL LOW (ref 4.22–5.81)
RDW: 14.4 % (ref 11.5–15.5)
WBC: 8.2 K/uL (ref 4.0–10.5)
nRBC: 0 % (ref 0.0–0.2)

## 2023-07-23 SURGERY — TRANSESOPHAGEAL ECHOCARDIOGRAM (TEE) (CATHLAB)
Anesthesia: Monitor Anesthesia Care

## 2023-07-23 SURGERY — EGD (ESOPHAGOGASTRODUODENOSCOPY)
Anesthesia: Monitor Anesthesia Care

## 2023-07-23 MED ORDER — ONDANSETRON HCL 4 MG/2ML IJ SOLN: 4.0000 mg | Freq: Four times a day (QID) | INTRAMUSCULAR | Status: DC | PRN

## 2023-07-23 MED ORDER — OXYCODONE HCL 5 MG PO TABS: 5.0000 mg | ORAL_TABLET | Freq: Once | ORAL | Status: DC | PRN

## 2023-07-23 MED ORDER — CHLORHEXIDINE GLUCONATE CLOTH 2 % EX PADS
6.0000 | MEDICATED_PAD | Freq: Every day | CUTANEOUS | Status: DC
  Administered 2023-07-24 – 2023-07-25 (×2): 6 via TOPICAL

## 2023-07-23 MED ORDER — LIDOCAINE HCL (CARDIAC) PF 100 MG/5ML IV SOSY
PREFILLED_SYRINGE | INTRAVENOUS | Status: DC | PRN
  Administered 2023-07-23: 100 mg via INTRAVENOUS

## 2023-07-23 MED ORDER — SODIUM CHLORIDE 0.9 % IV SOLN
INTRAVENOUS | Status: AC | PRN
  Administered 2023-07-23: 500 mL via INTRAVENOUS

## 2023-07-23 MED ORDER — PROPOFOL 500 MG/50ML IV EMUL
INTRAVENOUS | Status: DC | PRN
  Administered 2023-07-23: 40 mg via INTRAVENOUS
  Administered 2023-07-23: 20 mg via INTRAVENOUS
  Administered 2023-07-23: 125 ug/kg/min via INTRAVENOUS
  Administered 2023-07-23: 20 mg via INTRAVENOUS

## 2023-07-23 MED ORDER — FENTANYL CITRATE (PF) 100 MCG/2ML IJ SOLN: 25.0000 ug | INTRAMUSCULAR | Status: DC | PRN

## 2023-07-23 MED ORDER — OXYCODONE HCL 5 MG/5ML PO SOLN: 5.0000 mg | Freq: Once | ORAL | Status: DC | PRN

## 2023-07-23 MED ORDER — PANTOPRAZOLE SODIUM 40 MG PO TBEC
40.0000 mg | DELAYED_RELEASE_TABLET | Freq: Every day | ORAL | Status: DC
  Administered 2023-07-23 – 2023-07-25 (×3): 40 mg via ORAL
  Filled 2023-07-23 (×3): qty 1

## 2023-07-23 NOTE — Progress Notes (Signed)
 [facility] Progress Note   Subjective:    Seen and examined patient at bedside. Family members also at bedside. S/p upper GI endoscopy today which found esophagitis with no bleeding. Noted no bleeding from AVF during last HD treatment. Next HD 7/22.  Objective Vitals:   07/23/23 0909 07/23/23 0910 07/23/23 0920 07/23/23 1034  BP: (!) 121/43 (!) 120/43 (!) 125/44 (!) 177/79  Pulse: (!) 56 (!) 56 (!) 56 (!) 59  Resp: 17 19 (!) 21 19  Temp: (!) 96.9 F (36.1 C)   98.1 F (36.7 C)  TempSrc: Temporal   Oral  SpO2: 99% 99% 99% 100%  Weight:      Height:       Physical Exam General: Chronically ill appearing male in NAD Heart: RRR Lungs: CTAB, nml WOB on RA Abdomen: Soft, NTND Extremities: No LE edema Dialysis Access: LU AVF +b/t, dressing in place, no blood present   [facility] Weights   07/21/23 1345 07/21/23 2149 07/22/23 0120  Weight: 75.5 kg 78.3 kg 76.2 kg    Intake/Output Summary (Last 24 hours) at 07/23/2023 1520 Last data filed at 07/23/2023 1437 Gross per 24 hour  Intake 560 ml  Output 0 ml  Net 560 ml    Additional Objective Labs: Basic Metabolic Panel: Recent Labs  Lab 07/21/23 1307 07/22/23 1022 07/23/23 0539  NA 126* 132* 132*  K 4.0 4.2 4.5  CL 88* 94* 92*  CO2 20* 25 22  GLUCOSE 99 154* 87  BUN 51* 27* 36*  CREATININE 11.08* 7.23* 8.18*  CALCIUM  7.3* 7.6* 7.5*  PHOS 9.1* 5.9* 6.5*   Liver Function Tests: Recent Labs  Lab 07/19/23 1134 07/21/23 1307 07/22/23 1022 07/23/23 0539  AST 49*  --   --   --   ALT 81*  --   --   --   ALKPHOS 88  --   --   --   BILITOT 0.7  --   --   --   PROT 6.6  --   --   --   ALBUMIN  2.7* 2.3* 2.1* 2.3*   No results for input(s): LIPASE, AMYLASE in the last 168 hours. CBC: Recent Labs  Lab 07/17/23 0420 07/18/23 0846 07/19/23 1134 07/20/23 0409 07/21/23 1307 07/22/23 1022 07/23/23 0539  WBC 10.9*   < > 12.4* 10.6* 9.6 8.7 8.2  NEUTROABS 9.0*  --  10.3*  --   --   --   --   HGB 9.3*    < > 9.0* 7.9* 8.1* 7.9* 8.3*  HCT 28.8*   < > 26.8* 23.6* 23.9* 23.7* 25.2*  MCV 89.7   < > 88.2 86.4 85.1 86.5 87.5  PLT 235   < > 298 294 321 354 384   < > = values in this interval not displayed.   Blood Culture    Component Value Date/Time   SDES BLOOD BLOOD RIGHT ARM 07/23/2023 0543   SPECREQUEST  07/23/2023 0543    BOTTLES DRAWN AEROBIC AND ANAEROBIC Blood Culture adequate volume   CULT  07/23/2023 0543    NO GROWTH < 12 HOURS Performed at [facility], [address], [place], [place] [address]    REPTSTATUS PENDING 07/23/2023 0543    Cardiac Enzymes: No results for input(s): CKTOTAL, CKMB, CKMBINDEX, TROPONINI in the last 168 hours. CBG: Recent Labs  Lab 07/22/23 1955 07/23/23 0629 07/23/23 0917 07/23/23 0951 07/23/23 1108  GLUCAP 147* 92 86 88 153*   Iron  Studies: No results for  input(s): IRON , TIBC, TRANSFERRIN, FERRITIN in the last 72 hours. Lab Results  Component Value Date   INR 1.3 (H) 07/19/2023   INR 1.6 (H) 07/13/2023   INR 1.1 02/16/2023   Studies/Results: No results found.  Medications:   ceFAZolin  (ANCEF ) IV 1 g (07/22/23 1725)    (feeding supplement) PROSource Plus  30 mL Oral BID BM   acetaminophen   1,000 mg Oral TID   amLODipine   5 mg Oral Daily   aspirin  EC  81 mg Oral Daily   darbepoetin (ARANESP ) injection - DIALYSIS  200 mcg Subcutaneous Q Sun-1800   heparin  injection (subcutaneous)  5,000 Units Subcutaneous Q8H   insulin  aspart  0-15 Units Subcutaneous TID WC   insulin  glargine-yfgn  20 Units Subcutaneous QHS   isosorbide  mononitrate  30 mg Oral Once per day on Sunday Tuesday Thursday Saturday   methocarbamol   500 mg Oral TID   metoprolol  succinate  50 mg Oral Daily   ondansetron  (ZOFRAN ) IV  4 mg Intravenous Once   pantoprazole   40 mg Oral Daily   rosuvastatin   40 mg Oral Daily    Dialysis Orders: TTS -  4 hours, 450/A1.5, EDW 76kg, 2K/2.5Ca bath, AVF, no heparin  - Mircera 150mcg IV q 2 weeks  (ordered, not get given) - last got Aranesp  on 7/13 - Hectoral 1mcg IV q HD  Assessment/Plan: MSSA bacteremia with associated LS epidural phlegmon and paraspinal abscess: Started on Cefazolin . IR aspirated yesterday with initial culture showing gram+ bacteria.  AVF US  without abscess. ID & Neurosurgery consulted. TEE recommended but requires EGD prior to ensure a candidate due to Hx esophagitis with focal necrosis. S/p EGD today-found esophagitis with no bleeding. ESRD:  Usual TTS schedule - Next HD 07/24/23. AVF bleeding - d/t removing scab.  seen by Vascular, stitch placed. No further bleeding.  Advised not to scratch AVF, keep bandage in place between treatment if needed to help prevent scratching.  Important to hold manual compression for 10-72min post treatment. Hypertension/volume: BP in goal. Continue home meds. UF as tolerated. Anemia: Hgb down to 7.9 - resume ESA q Sunday this admit.  No IV iron  due to infection.  Metabolic bone disease: CorrCa/Phos ok - continue home meds. Nutrition:  Alb low, adding protein supplements. T2DM Esophagitis - EGD planned for tomorrow.   Justin Piety, NP [facility] 07/23/2023,3:20 PM  LOS: 4 days

## 2023-07-23 NOTE — Plan of Care (Signed)
   Problem: Coping: Goal: Ability to adjust to condition or change in health will improve Outcome: Progressing   Problem: Fluid Volume: Goal: Ability to maintain a balanced intake and output will improve Outcome: Progressing   Problem: Skin Integrity: Goal: Risk for impaired skin integrity will decrease Outcome: Progressing

## 2023-07-23 NOTE — TOC Initial Note (Signed)
 Transition of Care [facility]) - Initial/Assessment Note    Patient Details  Name: Justin [name] MRN: [id number] Date of Birth: [birth year]  Transition of [facility]) CM/SW Contact:    Waddell Barnie Rama, RN Phone Number: 07/23/2023, 5:03 PM  Clinical Narrative:                 From home with girlfriend, has PCP and insurance on file, states has no HH services in place at this time or DME at home.  States girlfriend will transport them home at [employer] and family is support system, states gets medications from [place].  Pta self ambulatory.   There are no TOC needs identified  at this time.  Please place consult for TOC needs.    Expected Discharge Plan: Home/Self Care Barriers to Discharge: Continued Medical Work up   Patient Goals and CMS Choice Patient states their goals for this hospitalization and ongoing recovery are:: return home   Choice offered to / list presented to : NA      Expected Discharge Plan and Services In-house Referral: NA Discharge Planning Services: CM Consult Post Acute Care Choice: NA Living arrangements for the past 2 months: Single Family Home                 DME Arranged: N/A DME Agency: NA       HH Arranged: NA          Prior Living Arrangements/Services Living arrangements for the past 2 months: Single Family Home Lives with:: Friends Patient language and need for interpreter reviewed:: Yes Do you feel safe going back to the place where you live?: Yes      Need for Family Participation in Patient Care: No (Comment) Care giver support system in place?: Yes (comment)   Criminal Activity/Legal Involvement Pertinent to Current Situation/Hospitalization: No - Comment as needed  Activities of Daily Living   ADL Screening (condition at time of admission) Independently performs ADLs?: Yes (appropriate for developmental age) Is the patient deaf or have difficulty hearing?: No Does the patient have difficulty seeing, even when wearing  glasses/contacts?: No Does the patient have difficulty concentrating, remembering, or making decisions?: No  Permission Sought/Granted Permission sought to share information with : Case Manager                Emotional Assessment Appearance:: Appears stated age Attitude/Demeanor/Rapport: Engaged Affect (typically observed): Appropriate Orientation: : Oriented to Self, Oriented to Place, Oriented to  Time, Oriented to Situation   Psych Involvement: No (comment)  Admission diagnosis:  Bacteremia [R78.81] Bacteremia due to methicillin susceptible Staphylococcus aureus (MSSA) [R78.81, B95.61] Patient Active Problem List   Diagnosis Date Noted   Bacteremia due to methicillin susceptible Staphylococcus aureus (MSSA) 07/19/2023   Hemorrhagic shock (HCC) 07/13/2023   Bilateral carotid artery stenosis 06/06/2023   Erosive esophagitis 06/06/2023   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) 06/06/2023   Vitamin B12 deficiency 06/06/2023   Dependence on renal dialysis (HCC) 02/26/2023   Fall 02/26/2023   Gastroesophageal reflux disease without esophagitis 02/26/2023   Fever, unspecified 02/26/2023   Headache, unspecified 02/26/2023   Pure hypercholesterolemia 02/26/2023   Other fluid overload 02/26/2023   Esophagitis 02/25/2023   Insomnia due to medical condition 02/25/2023   Hiccups 02/24/2023   Acute postoperative anemia due to expected blood loss 02/23/2023   Globus sensation 02/23/2023   Delirium due to multiple etiologies 02/23/2023   S/P ORIF (open reduction internal fixation) fracture - right intertrochanteric fracture 02/17/2023  Closed comminuted intertrochanteric fracture of proximal end of femur with nonunion, right 02/16/2023   Type 2 diabetes mellitus with hyperglycemia (HCC) 02/16/2023   BPH (benign prostatic hyperplasia) 10/18/2020   Dysuria 10/18/2020   Pinna disorder, left 10/18/2020   Renal disorder 10/18/2020   Wears glasses 10/18/2020    Coronary artery disease involving native coronary artery of native heart without angina pectoris 11/19/2019   ED (erectile dysfunction) 11/19/2019   Essential hypertension 11/19/2019   ESRD on hemodialysis (HCC) - out-pt HD at [facility] on TTS 5:15 am chair time 11/19/2019   Other disorders of phosphorus metabolism 06/28/2018   Aortic valve disorder 05/07/2018   Mild protein-calorie malnutrition (HCC) 05/02/2018   Anemia in chronic kidney disease 05/01/2018   Coagulation defect, unspecified (HCC) 05/01/2018   Iron  deficiency anemia, unspecified 05/01/2018   Pruritus, unspecified 05/01/2018   Pain, unspecified 05/01/2018   Shortness of breath 05/01/2018   Secondary hyperparathyroidism of renal origin (HCC) 05/01/2018   Nonrheumatic aortic valve stenosis    Chronic systolic CHF (congestive heart failure) (HCC) - now with recovered LVEF 04/24/2018   Hypertensive heart disease with congestive heart failure and stage 5 kidney disease (HCC) 04/24/2018   Type 2 diabetes mellitus with chronic kidney disease on chronic dialysis, with long-term current use of insulin  (HCC) 04/24/2018   Hyperlipidemia 04/24/2018   Long term (current) use of insulin  (HCC) 04/24/2018   Lower urinary tract symptoms due to benign prostatic hyperplasia 05/20/2014   PCP:  Bernie Lamar PARAS, MD Pharmacy:   [facility] [address] [place], [place] - [address] [place] [address] Phone: [phone] Fax: [phone]     Social Drivers of Health (SDOH) Social History: SDOH Screenings   Food Insecurity: No Food Insecurity (07/20/2023)  Housing: Low Risk  (07/20/2023)  Transportation Needs: No Transportation Needs (07/20/2023)  Utilities: Not At Risk (07/20/2023)  Financial Resource Strain: Low Risk  (07/13/2020)   Received from [facility]  Social Connections: Socially Integrated (07/20/2023)  Tobacco Use: Low Risk  (07/23/2023)   SDOH Interventions:     Readmission  Risk Interventions    07/23/2023    5:01 PM 07/16/2023   11:47 AM  Readmission Risk Prevention Plan  Transportation Screening Complete Complete  PCP or Specialist Appt within 3-5 Days  Complete  HRI or Home Care Consult Complete Complete  Social Work Consult for Recovery Care Planning/Counseling  Complete  Palliative Care Screening Not Applicable Not Applicable  Medication Review Oceanographer) Complete Referral to Pharmacy

## 2023-07-23 NOTE — Progress Notes (Signed)
 Progress Note   Patient: Justin [name] FMW:[id number] DOB: 1950-01-24 DOA: 07/19/2023     4 DOS: the patient was seen and examined on 07/23/2023   Brief hospital course: 73 year old man with PMH of ESRD on HD TTS, HFpEF, CAD, AAS, HTN, HLD, T2DM and recent admission from 7/11-7/16 for hemorrhaghic shock 2/2 bleeding fistula during HD who underwent fistulogram and angioplasty per IR on 7/14.  Patient was called back to the hospital the day after discharge due to positive blood cultures with MSSA growing from cultures drawn on 7/11 (prior admission).    Further evaluation has revealed a left-sided epidural fluid collection at the L3-L4 level.  Neurosurgery and interventional radiology consulted. Infectious disease, nephrology, vascular surgery also on consult.  Assessment and Plan: Epidural abscess MSSA bacteremia MRI of lumbar spine revealed epidural abscess. Infectious disease consulted.  Input appreciated. Neurosurgery consulted for drainage of abscess. Given no neurological complications, IR consult recommended for aspiration. IR has been consulted, fluoro-guided aspiration of left lumbar paraspinal process completed on 7/18 - Leukocytosis resolved, remains afebrile - Cardiology consulted for TTE, recommends EGD by GI due to history of esophagitis before for necrosis - s/p EGD on 7/21 w/ esophagitis  - Continue cefazolin . - Repeat blood cultures no growth in in 48 hrs - Will follow-up cultures from spinal aspirate - Cardiology consulted on 07/23/2023 for consideration of scheduling the TEE   ESRD on HD Patient on TTS hemodialysis schedule. He reports his last hemodialysis was on 7/16 while he was admitted. Nephrology consulted. Input appreciated. - Continue with hemodialysis per nephrology.   Recent admission from bleeding fistula. S/p fistulogram and angioplasty on 7/14. Patient had some bleeding from his AV fistula on 7/18. Addressed by vascular surgery. - Will continue to  monitor.   HTN - Continue home Norvasc , Imdur , metoprolol .   T2DM, controlled A1c was 5.3 on 7/17 - Fasting CBG of 115 this morning - Diabetic diet. - Long-acting, Premeal and sliding scale insulin .   Normocytic anemia Anemia of CKD - Hgb stable at 8.1 - Labs not drawn this morning - Transfuse if hemoglobin less than 7.   Hyponatremia - Na+ 132 (stable)   Esophagitis  - Protonix  40 mg PO daily   Subjective: Pt seen and examined at the bedside. EGD completed today which showed esophagitis. Protonix  started. Cardiology consulted today for consideration of scheduling the TEE.  Physical Exam: Vitals:   07/23/23 0909 07/23/23 0910 07/23/23 0920 07/23/23 1034  BP: (!) 121/43 (!) 120/43 (!) 125/44 (!) 177/79  Pulse: (!) 56 (!) 56 (!) 56 (!) 59  Resp: 17 19 (!) 21 19  Temp: (!) 96.9 F (36.1 C)   98.1 F (36.7 C)  TempSrc: Temporal   Oral  SpO2: 99% 99% 99% 100%  Weight:      Height:       Physical Exam HENT:     Head: Normocephalic.     Mouth/Throat:     Mouth: Mucous membranes are moist.  Cardiovascular:     Rate and Rhythm: Bradycardia present.  Pulmonary:     Effort: Pulmonary effort is normal.  Abdominal:     Palpations: Abdomen is soft.  Skin:    General: Skin is warm.  Neurological:     Mental Status: He is alert. Mental status is at baseline.  Psychiatric:        Mood and Affect: Mood normal.      Disposition: Status is: Inpatient Remains inpatient appropriate because: TEE and IV antibx  Planned Discharge  Destination: Barriers to discharge: IV antibx and TEE as above    Time spent: 35 minutes  Author: ATLEE ABERNETHY , MD 07/23/2023 1:48 PM  For on call review [email or web address].

## 2023-07-23 NOTE — Op Note (Signed)
 [facility] Patient Name: Justin [name] Procedure Date : 07/23/2023 MRN: [id number] Attending MD: Gustav ALONSO Mcgee , MD, [id number] Date of Birth: [birth year] CSN: [id number] Age: 73 Admit Type: Inpatient Procedure:                Upper GI endoscopy Indications:              Follow-up of reflux esophagitis, MSSA bacteremia                            with endocarditis, EGD prior to pre procedure TEE Providers:                Gustav ALONSO Mcgee, MD, Hoy Penner, RN,                            Curtistine Bishop, Technician Referring MD:              Medicines:                Monitored Anesthesia Care Complications:            No immediate complications. Estimated Blood Loss:     Estimated blood loss was minimal. Procedure:                Pre-Anesthesia Assessment:                           - Prior to the procedure, a History and Physical                            was performed, and patient medications and                            allergies were reviewed. The patient's tolerance of                            previous anesthesia was also reviewed. The risks                            and benefits of the procedure and the sedation                            options and risks were discussed with the patient.                            All questions were answered, and informed consent                            was obtained. Prior Anticoagulants: The patient has                            taken no anticoagulant or antiplatelet agents. ASA                            Grade Assessment: III - A patient with severe  systemic disease. After reviewing the risks and                            benefits, the patient was deemed in satisfactory                            condition to undergo the procedure.                           After obtaining informed consent, the endoscope was                            passed under direct vision. Throughout the                             procedure, the patient's blood pressure, pulse, and                            oxygen  saturations were monitored continuously. The                            [id number]-H190 ([id number]) Olympus endoscope was introduced                            through the mouth, and advanced to the second part                            of duodenum. The upper GI endoscopy was                            accomplished without difficulty. The patient                            tolerated the procedure well. Scope In: Scope Out: Findings:      LA Grade A (one or more mucosal breaks less than 5 mm, not extending       between tops of 2 mucosal folds) esophagitis with no bleeding was found       38 to 40 cm from the incisors.      A 2 cm hiatal hernia was present.      The exam of the esophagus was otherwise normal.      The stomach was normal.      The cardia and gastric fundus were normal on retroflexion.      The examined duodenum was normal. Impression:               - LA Grade A reflux esophagitis with no bleeding.                           - 2 cm hiatal hernia.                           - Normal stomach.                           - Normal examined duodenum.                           -  No specimens collected. Recommendation:           - Use Protonix  (pantoprazole ) 40 mg PO daily.                           - Follow an antireflux regimen.                           - Resume previous diet.                           - Continue present medications.                           - GI signing off, please call with any questions Procedure Code(s):        --- Professional ---                           [phone], Esophagogastroduodenoscopy, flexible,                            transoral; diagnostic, including collection of                            specimen(s) by brushing or washing, when performed                            (separate procedure) Diagnosis Code(s):        --- Professional ---                            K21.00, Gastro-esophageal reflux disease with                            esophagitis, without bleeding                           K44.9, Diaphragmatic hernia without obstruction or                            gangrene CPT copyright 2022 [employer]. All rights reserved. The codes documented in this report are preliminary and upon coder review may  be revised to meet current compliance requirements. Shaylie Eklund V. Bethani Brugger, MD 07/23/2023 9:18:47 AM This report has been signed electronically. Number of Addenda: 0

## 2023-07-23 NOTE — Transfer of Care (Signed)
 Immediate Anesthesia Transfer of Care Note  Patient: Justin [name]  Procedure(s) Performed: EGD (ESOPHAGOGASTRODUODENOSCOPY)  Patient Location: Endoscopy Unit  Anesthesia Type:MAC  Level of Consciousness: drowsy  Airway & Oxygen  Therapy: Patient Spontanous Breathing and Patient connected to face mask oxygen   Post-op Assessment: Report given to RN and Post -op Vital signs reviewed and stable  Post vital signs: Reviewed and stable  Last Vitals:  Vitals Value Taken Time  BP 121/43 07/23/23 09:07  Temp    Pulse 56 07/23/23 09:08  Resp 17 07/23/23 09:08  SpO2 99 % 07/23/23 09:08  Vitals shown include unfiled device data.  Last Pain:  Vitals:   07/23/23 0819  TempSrc: Temporal  PainSc: 7       Patients Stated Pain Goal: 0 (07/23/23 0819)  Complications: No notable events documented.

## 2023-07-23 NOTE — Interval H&P Note (Signed)
 History and Physical Interval Note:  07/23/2023 8:41 AM  Justin [name]  has presented today for surgery, with the diagnosis of History of severe esophagitis, need for TEE, EGD requested prior to attempt at TEE.  The various methods of treatment have been discussed with the patient and family. After consideration of risks, benefits and other options for treatment, the patient has consented to  Procedure(s): EGD (ESOPHAGOGASTRODUODENOSCOPY) (N/A) as a surgical intervention.  The patient's history has been reviewed, patient examined, no change in status, stable for surgery.  I have reviewed the patient's chart and labs.  Questions were answered to the patient's satisfaction.     Yolonda Purtle

## 2023-07-23 NOTE — Anesthesia Postprocedure Evaluation (Signed)
 Anesthesia Post Note  Patient: Justin [name]  Procedure(s) Performed: EGD (ESOPHAGOGASTRODUODENOSCOPY)     Patient location during evaluation: Endoscopy Anesthesia Type: MAC Level of consciousness: awake and alert Pain management: pain level controlled Vital Signs Assessment: post-procedure vital signs reviewed and stable Respiratory status: spontaneous breathing, nonlabored ventilation, respiratory function stable and patient connected to nasal cannula oxygen  Cardiovascular status: stable and blood pressure returned to baseline Postop Assessment: no apparent nausea or vomiting Anesthetic complications: no   No notable events documented.  Last Vitals:  Vitals:   07/23/23 0910 07/23/23 0920  BP: (!) 120/43 (!) 125/44  Pulse: (!) 56 (!) 56  Resp: 19 (!) 21  Temp:    SpO2: 99% 99%    Last Pain:  Vitals:   07/23/23 0920  TempSrc:   PainSc: 0-No pain                 Edahi Kroening S

## 2023-07-23 NOTE — Anesthesia Preprocedure Evaluation (Signed)
 Anesthesia Evaluation  Patient identified by MRN, date of birth, ID band Patient awake    Reviewed: Allergy & Precautions, H&P , NPO status , Patient's Chart, lab work & pertinent test results  Airway Mallampati: II   Neck ROM: full    Dental   Pulmonary shortness of breath   breath sounds clear to auscultation       Cardiovascular hypertension, + CAD and +CHF   Rhythm:regular Rate:Normal     Neuro/Psych  Headaches    GI/Hepatic PUD,GERD  ,,  Endo/Other  diabetes, Type 2    Renal/GU ESRFRenal disease     Musculoskeletal   Abdominal   Peds  Hematology  (+) Blood dyscrasia, anemia   Anesthesia Other Findings   Reproductive/Obstetrics                              Anesthesia Physical Anesthesia Plan  ASA: 4  Anesthesia Plan: MAC   Post-op Pain Management:    Induction: Intravenous  PONV Risk Score and Plan: 1 and Propofol  infusion and Treatment may vary due to age or medical condition  Airway Management Planned: Nasal Cannula  Additional Equipment:   Intra-op Plan:   Post-operative Plan:   Informed Consent: I have reviewed the patients History and Physical, chart, labs and discussed the procedure including the risks, benefits and alternatives for the proposed anesthesia with the patient or authorized representative who has indicated his/her understanding and acceptance.     Dental advisory given  Plan Discussed with: CRNA, Anesthesiologist and Surgeon  Anesthesia Plan Comments:         Anesthesia Quick Evaluation

## 2023-07-24 ENCOUNTER — Telehealth: Payer: Self-pay

## 2023-07-24 ENCOUNTER — Encounter (HOSPITAL_COMMUNITY): Payer: Self-pay | Admitting: Gastroenterology

## 2023-07-24 DIAGNOSIS — R7881 Bacteremia: Secondary | ICD-10-CM | POA: Diagnosis not present

## 2023-07-24 DIAGNOSIS — Z794 Long term (current) use of insulin: Secondary | ICD-10-CM

## 2023-07-24 DIAGNOSIS — K209 Esophagitis, unspecified without bleeding: Secondary | ICD-10-CM | POA: Diagnosis not present

## 2023-07-24 DIAGNOSIS — E1121 Type 2 diabetes mellitus with diabetic nephropathy: Secondary | ICD-10-CM

## 2023-07-24 DIAGNOSIS — B9561 Methicillin susceptible Staphylococcus aureus infection as the cause of diseases classified elsewhere: Secondary | ICD-10-CM | POA: Diagnosis not present

## 2023-07-24 LAB — CBC
HCT: 25.3 % — ABNORMAL LOW (ref 39.0–52.0)
Hemoglobin: 8.4 g/dL — ABNORMAL LOW (ref 13.0–17.0)
MCH: 29 pg (ref 26.0–34.0)
MCHC: 33.2 g/dL (ref 30.0–36.0)
MCV: 87.2 fL (ref 80.0–100.0)
Platelets: 422 K/uL — ABNORMAL HIGH (ref 150–400)
RBC: 2.9 MIL/uL — ABNORMAL LOW (ref 4.22–5.81)
RDW: 14.5 % (ref 11.5–15.5)
WBC: 9.6 K/uL (ref 4.0–10.5)
nRBC: 0 % (ref 0.0–0.2)

## 2023-07-24 LAB — GLUCOSE, CAPILLARY
Glucose-Capillary: 112 mg/dL — ABNORMAL HIGH (ref 70–99)
Glucose-Capillary: 141 mg/dL — ABNORMAL HIGH (ref 70–99)
Glucose-Capillary: 138 mg/dL — ABNORMAL HIGH (ref 70–99)

## 2023-07-24 LAB — RENAL FUNCTION PANEL
Albumin: 2.4 g/dL — ABNORMAL LOW (ref 3.5–5.0)
Anion gap: 18 — ABNORMAL HIGH (ref 5–15)
BUN: 43 mg/dL — ABNORMAL HIGH (ref 8–23)
CO2: 21 mmol/L — ABNORMAL LOW (ref 22–32)
Calcium: 7.5 mg/dL — ABNORMAL LOW (ref 8.9–10.3)
Chloride: 89 mmol/L — ABNORMAL LOW (ref 98–111)
Creatinine, Ser: 9.54 mg/dL — ABNORMAL HIGH (ref 0.61–1.24)
GFR, Estimated: 5 mL/min — ABNORMAL LOW (ref >60)
Glucose, Bld: 110 mg/dL — ABNORMAL HIGH (ref 70–99)
Phosphorus: 6.5 mg/dL — ABNORMAL HIGH (ref 2.5–4.6)
Potassium: 4.5 mmol/L (ref 3.5–5.1)
Sodium: 128 mmol/L — ABNORMAL LOW (ref 135–145)

## 2023-07-24 MED ORDER — HEPARIN SODIUM (PORCINE) 1000 UNIT/ML DIALYSIS: 1000.0000 [IU] | INTRAMUSCULAR | Status: DC | PRN

## 2023-07-24 MED ORDER — LIDOCAINE HCL (PF) 1 % IJ SOLN: 5.0000 mL | INTRAMUSCULAR | Status: DC | PRN

## 2023-07-24 MED ORDER — LIDOCAINE-PRILOCAINE 2.5-2.5 % EX CREA: 1.0000 | TOPICAL_CREAM | CUTANEOUS | Status: DC | PRN

## 2023-07-24 MED ORDER — AMLODIPINE BESYLATE 10 MG PO TABS
10.0000 mg | ORAL_TABLET | Freq: Every day | ORAL | Status: DC
  Administered 2023-07-25: 10 mg via ORAL
  Filled 2023-07-24: qty 1

## 2023-07-24 MED ORDER — PENTAFLUOROPROP-TETRAFLUOROETH EX AERO: 1.0000 | INHALATION_SPRAY | CUTANEOUS | Status: DC | PRN

## 2023-07-24 MED ORDER — ALTEPLASE 2 MG IJ SOLR: 2.0000 mg | Freq: Once | INTRAMUSCULAR | Status: DC | PRN

## 2023-07-24 MED ORDER — FERRIC CITRATE 1 GM 210 MG(FE) PO TABS
630.0000 mg | ORAL_TABLET | Freq: Three times a day (TID) | ORAL | Status: DC
  Administered 2023-07-24 – 2023-07-25 (×2): 630 mg via ORAL
  Filled 2023-07-24 (×2): qty 3

## 2023-07-24 MED ORDER — OXYCODONE HCL 5 MG PO TABS
ORAL_TABLET | ORAL | Status: AC
  Filled 2023-07-24: qty 1

## 2023-07-24 MED ORDER — HYDROMORPHONE HCL 1 MG/ML IJ SOLN
INTRAMUSCULAR | Status: AC
  Filled 2023-07-24: qty 0.5

## 2023-07-24 MED ORDER — ANTICOAGULANT SODIUM CITRATE 4% (200MG/5ML) IV SOLN
5.0000 mL | Status: DC | PRN
Start: 2023-07-24 — End: 2023-07-24

## 2023-07-24 NOTE — Care Management Important Message (Signed)
 Important Message  Patient Details  Name: Justin [name] MRN: [id number] Date of Birth: 12-31-1950   Important Message Given:  Yes - Medicare IM     Claretta Deed 07/24/2023, 4:18 PM

## 2023-07-24 NOTE — Telephone Encounter (Signed)
 Pt viewed Echo results on My Chart per Dr. Vanetta Shawl note. Routed to PCP.

## 2023-07-24 NOTE — Progress Notes (Signed)
 [facility] Progress Note   Subjective:    Seen and examined patient at bedside. Tolerating UFG 1.5L. Noted no bleeding issues from AVF occurred on HD 7/20. Informed of mild bleeding this morning from AVF but resolved quickly. Noted Cardiology consulted yesterday to schedule TEE.  Objective Vitals:   07/24/23 1100 07/24/23 1123 07/24/23 1124 07/24/23 1149  BP: (!) 182/68 (!) 176/59 (!) 184/62   Pulse: 62 62 (!) 59   Resp: 14 18 14    Temp:   97.9 F (36.6 C)   TempSrc:      SpO2: 100% 99% 99%   Weight:    77.9 kg  Height:       Physical Exam General: Chronically ill appearing male in NAD Heart: RRR Lungs: CTAB, nml WOB on RA Abdomen: Soft, NTND Extremities: No LE edema Dialysis Access: LU AVF +b/t, no blood present   [facility] Weights   07/21/23 2149 07/22/23 0120 07/24/23 1149  Weight: 78.3 kg 76.2 kg 77.9 kg    Intake/Output Summary (Last 24 hours) at 07/24/2023 1154 Last data filed at 07/24/2023 1124 Gross per 24 hour  Intake --  Output 1400 ml  Net -1400 ml    Additional Objective Labs: Basic Metabolic Panel: Recent Labs  Lab 07/22/23 1022 07/23/23 0539 07/24/23 0502  NA 132* 132* 128*  K 4.2 4.5 4.5  CL 94* 92* 89*  CO2 25 22 21*  GLUCOSE 154* 87 110*  BUN 27* 36* 43*  CREATININE 7.23* 8.18* 9.54*  CALCIUM  7.6* 7.5* 7.5*  PHOS 5.9* 6.5* 6.5*   Liver Function Tests: Recent Labs  Lab 07/19/23 1134 07/21/23 1307 07/22/23 1022 07/23/23 0539 07/24/23 0502  AST 49*  --   --   --   --   ALT 81*  --   --   --   --   ALKPHOS 88  --   --   --   --   BILITOT 0.7  --   --   --   --   PROT 6.6  --   --   --   --   ALBUMIN  2.7*   < > 2.1* 2.3* 2.4*   < > = values in this interval not displayed.   No results for input(s): LIPASE, AMYLASE in the last 168 hours. CBC: Recent Labs  Lab 07/19/23 1134 07/20/23 0409 07/21/23 1307 07/22/23 1022 07/23/23 0539 07/24/23 0502  WBC 12.4* 10.6* 9.6 8.7 8.2 9.6  NEUTROABS 10.3*  --   --   --    --   --   HGB 9.0* 7.9* 8.1* 7.9* 8.3* 8.4*  HCT 26.8* 23.6* 23.9* 23.7* 25.2* 25.3*  MCV 88.2 86.4 85.1 86.5 87.5 87.2  PLT 298 294 321 354 384 422*   Blood Culture    Component Value Date/Time   SDES BLOOD BLOOD RIGHT ARM 07/23/2023 0543   SPECREQUEST  07/23/2023 0543    BOTTLES DRAWN AEROBIC AND ANAEROBIC Blood Culture adequate volume   CULT  07/23/2023 0543    NO GROWTH 1 DAY Performed at [facility], [address] [place], [place] [address]    REPTSTATUS PENDING 07/23/2023 0543    Cardiac Enzymes: No results for input(s): CKTOTAL, CKMB, CKMBINDEX, TROPONINI in the last 168 hours. CBG: Recent Labs  Lab 07/23/23 0917 07/23/23 0951 07/23/23 1108 07/23/23 1657 07/23/23 2028  GLUCAP 86 88 153* 149* 188*   Iron  Studies: No results for input(s): IRON , TIBC, TRANSFERRIN, FERRITIN in the last 72 hours. Lab Results  Component Value Date   INR 1.3 (H) 07/19/2023   INR 1.6 (H) 07/13/2023   INR 1.1 02/16/2023   Studies/Results: No results found.  Medications:  anticoagulant sodium citrate       ceFAZolin  (ANCEF ) IV 1 g (07/23/23 1703)    (feeding supplement) PROSource Plus  30 mL Oral BID BM   acetaminophen   1,000 mg Oral TID   [START ON 07/25/2023] amLODipine   10 mg Oral Daily   aspirin  EC  81 mg Oral Daily   Chlorhexidine  Gluconate Cloth  6 each Topical Q0600   darbepoetin (ARANESP ) injection - DIALYSIS  200 mcg Subcutaneous Q Sun-1800   heparin  injection (subcutaneous)  5,000 Units Subcutaneous Q8H   insulin  aspart  0-15 Units Subcutaneous TID WC   insulin  glargine-yfgn  20 Units Subcutaneous QHS   isosorbide  mononitrate  30 mg Oral Once per day on Sunday Tuesday Thursday Saturday   methocarbamol   500 mg Oral TID   metoprolol  succinate  50 mg Oral Daily   ondansetron  (ZOFRAN ) IV  4 mg Intravenous Once   pantoprazole   40 mg Oral Daily   rosuvastatin   40 mg Oral Daily    Dialysis Orders: TTS - [place] 4 hours, 450/A1.5, EDW 76kg,  2K/2.5Ca bath, AVF, no heparin  - Mircera 150mcg IV q 2 weeks (ordered, not get given) - last got Aranesp  on 7/13 - Hectoral 1mcg IV q HD  Assessment/Plan: MSSA bacteremia with associated LS epidural phlegmon and paraspinal abscess: Started on Cefazolin . IR aspirated 7/19 with initial culture showing gram+ bacteria.  AVF US  without abscess. ID & Neurosurgery consulted. TEE recommended but requires EGD prior to ensure a candidate due to Hx esophagitis with focal necrosis. S/p EGD 7/21-found esophagitis with no bleeding. Cardiology consulted yesterday to schedule TEE. ESRD:  Usual TTS schedule - On HD. AVF bleeding - d/t removing scab.  seen by Vascular, stitch placed. Informed of mild bleeding from AVF this morning but resolved quickly.  Advised not to scratch AVF, keep bandage in place between treatment if needed to help prevent scratching.  Important to hold manual compression for 10-70min post treatment. Hypertension/volume: BP in goal. Continue home meds. UF as tolerated. Anemia: ESA resumed q Sunday this admit.  No IV iron  due to infection. Hgb 8.4 Metabolic bone disease: CorrCa 8.7 and phos elevated - resumed binders today. Nutrition:  Alb low, on protein supplements. T2DM Esophagitis - S/p EGD 7/21 showed esophagitis without bleeding.   Charmaine Piety, NP [facility] 07/24/2023,11:54 AM  LOS: 5 days

## 2023-07-24 NOTE — Progress Notes (Addendum)
 Received patient in bed.Alert and oriented x 4.He signed his HD treatment's consnet.  Access used;Left  arm avf that worked well.  Duration of treatment: 3. hours.  Uf goal: 1.4 liter.  Medicines given: Oxycodone  5 mg.                             Hydromorphone  0.4 nmg.  Hemo comment: Patient shouted to the staffs,he wanted to sit at the side of the bed,but transporter was about to roll him out.   Hand off to the patient's nurse,back into his room via transporter with stable condition.

## 2023-07-24 NOTE — H&P (View-Only) (Signed)
 [facility]                                                                              Justin [name], is a 73 y.o. male, DOB - 10-06-1950, FMW:[id number] Admit date - 07/19/2023    Outpatient Primary MD for the patient is [name], [name], MD  LOS - 5  days  Chief Complaint  Patient presents with   Abnormal Lab   Back Pain       Brief summary   73 year old man with PMH of ESRD on HD TTS, HFpEF, CAD, AAS, HTN, HLD, T2DM and recent admission from 7/11-7/16 for hemorrhaghic shock 2/2 bleeding fistula during HD who underwent fistulogram and angioplasty per IR on 7/14.  Patient was called back to the hospital the day after discharge due to positive blood cultures with MSSA growing from cultures drawn on 7/11 (prior admission).   Further evaluation revealed left-sided epidural fluid collection at the L3-L4 level.   Neurosurgery and interventional radiology consulted. Underwent fluoroscopy guided aspiration of left lumbar paraspinal process on 7/18 ID, nephrology, vascular surgery following  Cardiology consulted for TEE, recommended EGD by GI due to history of esophagitis before.  Underwent EGD on 7/21 + LA grade A reflux esophagitis with no bleeding   Assessment & Plan      Epidural abscess, MSSA bacteremia - MRI of lumbar spine revealed epidural abscess.  Neurosurgery and ID consulted -Neurosurgery recommended IR aspiration/drainage - Underwent fluoroscopy guided aspiration of left lumbar paraspinal process on 7/18 - Continue IV cefazolin , repeat blood cultures NTD - EGD 7/21 showed LA grade a reflux esophagitis with no bleeding, continue Protonix  40 mg daily - Cardiology consulted on 07/23/2023 for scheduling the TEE   ESRD on HD, TTS -Nephrology consulted, continue HD per renal   Recent admission from bleeding fistula. S/p fistulogram and angioplasty on 7/14. Patient had some bleeding from his AV fistula on 7/18. Addressed by vascular surgery.    HTN -  BP somewhat elevated, increased to Norvasc  to 10 mg daily - Continue Imdur , metoprolol     Diabetes mellitus type 2, with underlying complication, ESRD A1c was 5.3 on 7/17 CBG (last 3)  Recent Labs    07/23/23 1108 07/23/23 1657 07/23/23 2028  GLUCAP 153* 149* 188*   Continue Semglee  20 units daily, moderate SSI    Normocytic anemia Anemia of CKD - H&H currently stable, transfuse for hemoglobin less than 7    Chronic hyponatremia -Continue to monitor with HD   Esophagitis  - Protonix  40 mg PO daily    Estimated body mass index is 25.54 kg/m as calculated from the following:   Height as of this encounter: 5' 8 (1.727 m).   Weight as of this encounter: 76.2 kg.  Code Status: Full code DVT Prophylaxis:  heparin  injection 5,000 Units Start: 07/19/23 1400   Level of Care: Level of care: Med-Surg Family Communication: Updated patient Disposition Plan:      Remains inpatient appropriate:      Procedures:  HD  Consultants:   Nephrology Neurosurgery ID Interventional etiology GI  Antimicrobials:   Anti-infectives (From admission, onward)    Start  Dose/Rate Route Frequency Ordered Stop   07/20/23 1800  ceFAZolin  (ANCEF ) IVPB 1 g/50 mL premix        1 g 100 mL/hr over 30 Minutes Intravenous Every 24 hours 07/19/23 1335     07/19/23 1100  ceFAZolin  (ANCEF ) IVPB 1 g/50 mL premix        1 g 100 mL/hr over 30 Minutes Intravenous  Once 07/19/23 1053 07/19/23 1404          Medications  (feeding supplement) PROSource Plus  30 mL Oral BID BM   acetaminophen   1,000 mg Oral TID   amLODipine   5 mg Oral Daily   aspirin  EC  81 mg Oral Daily   Chlorhexidine  Gluconate Cloth  6 each Topical Q0600   darbepoetin (ARANESP ) injection - DIALYSIS  200 mcg Subcutaneous Q Sun-1800   heparin  injection (subcutaneous)  5,000 Units Subcutaneous Q8H   insulin  aspart  0-15 Units Subcutaneous TID WC   insulin  glargine-yfgn  20 Units Subcutaneous QHS   isosorbide  mononitrate   30 mg Oral Once per day on Sunday Tuesday Thursday Saturday   methocarbamol   500 mg Oral TID   metoprolol  succinate  50 mg Oral Daily   ondansetron  (ZOFRAN ) IV  4 mg Intravenous Once   pantoprazole   40 mg Oral Daily   rosuvastatin   40 mg Oral Daily      Subjective:   Justin [name] was seen and examined today.  Seen during HD, denies any specific complaints, no acute chest pain, shortness of breath, fevers or chills.   Objective:   Vitals:   07/24/23 1030 07/24/23 1100 07/24/23 1123 07/24/23 1124  BP: (!) 186/66 (!) 182/68 (!) 176/59 (!) 184/62  Pulse: (!) 59 62 62 (!) 59  Resp: 13 14 18 14   Temp:    97.9 F (36.6 C)  TempSrc:      SpO2: 100% 100% 99% 99%  Weight:      Height:        Intake/Output Summary (Last 24 hours) at 07/24/2023 1127 Last data filed at 07/23/2023 1803 Gross per 24 hour  Intake 200 ml  Output 0 ml  Net 200 ml     Wt Readings from Last 3 Encounters:  07/22/23 76.2 kg  07/18/23 76.5 kg  06/07/23 78.8 kg     Exam General: Alert and oriented x 3, NAD Cardiovascular: S1 S2 auscultated,  RRR Respiratory: Clear to auscultation bilaterally, no wheezing Gastrointestinal: Soft, nontender, nondistended, + bowel sounds Ext: 1+ pedal edema bilaterally Neuro: No new deficits Psych: Normal affect     Data Reviewed:  I have personally reviewed following labs    CBC Lab Results  Component Value Date   WBC 9.6 07/24/2023   RBC 2.90 (L) 07/24/2023   HGB 8.4 (L) 07/24/2023   HCT 25.3 (L) 07/24/2023   MCV 87.2 07/24/2023   MCH 29.0 07/24/2023   PLT 422 (H) 07/24/2023   MCHC 33.2 07/24/2023   RDW 14.5 07/24/2023   LYMPHSABS 0.8 07/19/2023   MONOABS 1.0 07/19/2023   EOSABS 0.1 07/19/2023   BASOSABS 0.1 07/19/2023     Last metabolic panel Lab Results  Component Value Date   NA 128 (L) 07/24/2023   K 4.5 07/24/2023   CL 89 (L) 07/24/2023   CO2 21 (L) 07/24/2023   BUN 43 (H) 07/24/2023   CREATININE 9.54 (H) 07/24/2023   GLUCOSE 110 (H)  07/24/2023   GFRNONAA 5 (L) 07/24/2023   GFRAA 6 (L) 01/13/2019   CALCIUM  7.5 (L) 07/24/2023  PHOS 6.5 (H) 07/24/2023   PROT 6.6 07/19/2023   ALBUMIN  2.4 (L) 07/24/2023   LABGLOB 2.3 04/26/2018   AGRATIO 1.1 04/26/2018   BILITOT 0.7 07/19/2023   ALKPHOS 88 07/19/2023   AST 49 (H) 07/19/2023   ALT 81 (H) 07/19/2023   ANIONGAP 18 (H) 07/24/2023    CBG (last 3)  Recent Labs    07/23/23 1108 07/23/23 1657 07/23/23 2028  GLUCAP 153* 149* 188*      Coagulation Profile: Recent Labs  Lab 07/19/23 1134  INR 1.3*     Radiology Studies: I have personally reviewed the imaging studies  No results found.     Nydia Distance M.D. [facility] 07/24/2023, 11:27 AM  Available via Epic secure chat 7am-7pm After 7 pm, please refer to night coverage provider listed on amion.

## 2023-07-24 NOTE — Progress Notes (Signed)
 [facility]                                                                              Justin [name], is a 73 y.o. male, DOB - 10-06-1950, FMW:[id number] Admit date - 07/19/2023    Outpatient Primary MD for the patient is [name], [name], MD  LOS - 5  days  Chief Complaint  Patient presents with   Abnormal Lab   Back Pain       Brief summary   73 year old man with PMH of ESRD on HD TTS, HFpEF, CAD, AAS, HTN, HLD, T2DM and recent admission from 7/11-7/16 for hemorrhaghic shock 2/2 bleeding fistula during HD who underwent fistulogram and angioplasty per IR on 7/14.  Patient was called back to the hospital the day after discharge due to positive blood cultures with MSSA growing from cultures drawn on 7/11 (prior admission).   Further evaluation revealed left-sided epidural fluid collection at the L3-L4 level.   Neurosurgery and interventional radiology consulted. Underwent fluoroscopy guided aspiration of left lumbar paraspinal process on 7/18 ID, nephrology, vascular surgery following  Cardiology consulted for TEE, recommended EGD by GI due to history of esophagitis before.  Underwent EGD on 7/21 + LA grade A reflux esophagitis with no bleeding   Assessment & Plan      Epidural abscess, MSSA bacteremia - MRI of lumbar spine revealed epidural abscess.  Neurosurgery and ID consulted -Neurosurgery recommended IR aspiration/drainage - Underwent fluoroscopy guided aspiration of left lumbar paraspinal process on 7/18 - Continue IV cefazolin , repeat blood cultures NTD - EGD 7/21 showed LA grade a reflux esophagitis with no bleeding, continue Protonix  40 mg daily - Cardiology consulted on 07/23/2023 for scheduling the TEE   ESRD on HD, TTS -Nephrology consulted, continue HD per renal   Recent admission from bleeding fistula. S/p fistulogram and angioplasty on 7/14. Patient had some bleeding from his AV fistula on 7/18. Addressed by vascular surgery.    HTN -  BP somewhat elevated, increased to Norvasc  to 10 mg daily - Continue Imdur , metoprolol     Diabetes mellitus type 2, with underlying complication, ESRD A1c was 5.3 on 7/17 CBG (last 3)  Recent Labs    07/23/23 1108 07/23/23 1657 07/23/23 2028  GLUCAP 153* 149* 188*   Continue Semglee  20 units daily, moderate SSI    Normocytic anemia Anemia of CKD - H&H currently stable, transfuse for hemoglobin less than 7    Chronic hyponatremia -Continue to monitor with HD   Esophagitis  - Protonix  40 mg PO daily    Estimated body mass index is 25.54 kg/m as calculated from the following:   Height as of this encounter: 5' 8 (1.727 m).   Weight as of this encounter: 76.2 kg.  Code Status: Full code DVT Prophylaxis:  heparin  injection 5,000 Units Start: 07/19/23 1400   Level of Care: Level of care: Med-Surg Family Communication: Updated patient Disposition Plan:      Remains inpatient appropriate:      Procedures:  HD  Consultants:   Nephrology Neurosurgery ID Interventional etiology GI  Antimicrobials:   Anti-infectives (From admission, onward)    Start  Dose/Rate Route Frequency Ordered Stop   07/20/23 1800  ceFAZolin  (ANCEF ) IVPB 1 g/50 mL premix        1 g 100 mL/hr over 30 Minutes Intravenous Every 24 hours 07/19/23 1335     07/19/23 1100  ceFAZolin  (ANCEF ) IVPB 1 g/50 mL premix        1 g 100 mL/hr over 30 Minutes Intravenous  Once 07/19/23 1053 07/19/23 1404          Medications  (feeding supplement) PROSource Plus  30 mL Oral BID BM   acetaminophen   1,000 mg Oral TID   amLODipine   5 mg Oral Daily   aspirin  EC  81 mg Oral Daily   Chlorhexidine  Gluconate Cloth  6 each Topical Q0600   darbepoetin (ARANESP ) injection - DIALYSIS  200 mcg Subcutaneous Q Sun-1800   heparin  injection (subcutaneous)  5,000 Units Subcutaneous Q8H   insulin  aspart  0-15 Units Subcutaneous TID WC   insulin  glargine-yfgn  20 Units Subcutaneous QHS   isosorbide  mononitrate   30 mg Oral Once per day on Sunday Tuesday Thursday Saturday   methocarbamol   500 mg Oral TID   metoprolol  succinate  50 mg Oral Daily   ondansetron  (ZOFRAN ) IV  4 mg Intravenous Once   pantoprazole   40 mg Oral Daily   rosuvastatin   40 mg Oral Daily      Subjective:   Atthew Coutant was seen and examined today.  Seen during HD, denies any specific complaints, no acute chest pain, shortness of breath, fevers or chills.   Objective:   Vitals:   07/24/23 1030 07/24/23 1100 07/24/23 1123 07/24/23 1124  BP: (!) 186/66 (!) 182/68 (!) 176/59 (!) 184/62  Pulse: (!) 59 62 62 (!) 59  Resp: 13 14 18 14   Temp:    97.9 F (36.6 C)  TempSrc:      SpO2: 100% 100% 99% 99%  Weight:      Height:        Intake/Output Summary (Last 24 hours) at 07/24/2023 1127 Last data filed at 07/23/2023 1803 Gross per 24 hour  Intake 200 ml  Output 0 ml  Net 200 ml     Wt Readings from Last 3 Encounters:  07/22/23 76.2 kg  07/18/23 76.5 kg  06/07/23 78.8 kg     Exam General: Alert and oriented x 3, NAD Cardiovascular: S1 S2 auscultated,  RRR Respiratory: Clear to auscultation bilaterally, no wheezing Gastrointestinal: Soft, nontender, nondistended, + bowel sounds Ext: 1+ pedal edema bilaterally Neuro: No new deficits Psych: Normal affect     Data Reviewed:  I have personally reviewed following labs    CBC Lab Results  Component Value Date   WBC 9.6 07/24/2023   RBC 2.90 (L) 07/24/2023   HGB 8.4 (L) 07/24/2023   HCT 25.3 (L) 07/24/2023   MCV 87.2 07/24/2023   MCH 29.0 07/24/2023   PLT 422 (H) 07/24/2023   MCHC 33.2 07/24/2023   RDW 14.5 07/24/2023   LYMPHSABS 0.8 07/19/2023   MONOABS 1.0 07/19/2023   EOSABS 0.1 07/19/2023   BASOSABS 0.1 07/19/2023     Last metabolic panel Lab Results  Component Value Date   NA 128 (L) 07/24/2023   K 4.5 07/24/2023   CL 89 (L) 07/24/2023   CO2 21 (L) 07/24/2023   BUN 43 (H) 07/24/2023   CREATININE 9.54 (H) 07/24/2023   GLUCOSE 110 (H)  07/24/2023   GFRNONAA 5 (L) 07/24/2023   GFRAA 6 (L) 01/13/2019   CALCIUM  7.5 (L) 07/24/2023  PHOS 6.5 (H) 07/24/2023   PROT 6.6 07/19/2023   ALBUMIN  2.4 (L) 07/24/2023   LABGLOB 2.3 04/26/2018   AGRATIO 1.1 04/26/2018   BILITOT 0.7 07/19/2023   ALKPHOS 88 07/19/2023   AST 49 (H) 07/19/2023   ALT 81 (H) 07/19/2023   ANIONGAP 18 (H) 07/24/2023    CBG (last 3)  Recent Labs    07/23/23 1108 07/23/23 1657 07/23/23 2028  GLUCAP 153* 149* 188*      Coagulation Profile: Recent Labs  Lab 07/19/23 1134  INR 1.3*     Radiology Studies: I have personally reviewed the imaging studies  No results found.     Nydia Distance M.D. [facility] 07/24/2023, 11:27 AM  Available via Epic secure chat 7am-7pm After 7 pm, please refer to night coverage provider listed on amion.

## 2023-07-24 NOTE — Progress Notes (Signed)
   [facility] has been requested to perform a transesophageal echocardiogram on Justin [name] for bacteremia.     Relative Contraindications: Active Esophagitis  The patient has: History of Severe Valve Disease (stenosis or regurgitation)    After careful review of history and examination, the risks and benefits of transesophageal echocardiogram have been explained including risks of esophageal damage, perforation (1:10,000 risk), bleeding, pharyngeal hematoma as well as other potential complications associated with conscious sedation including aspiration, arrhythmia, respiratory failure and death. Alternatives to treatment were discussed, questions were answered. Patient is willing to proceed.   Signed, Waddell DELENA Donath, PA-C  07/24/2023 2:57 PM

## 2023-07-24 NOTE — Plan of Care (Signed)
   Problem: Coping: Goal: Ability to adjust to condition or change in health will improve Outcome: Progressing   Problem: Fluid Volume: Goal: Ability to maintain a balanced intake and output will improve Outcome: Progressing   Problem: Skin Integrity: Goal: Risk for impaired skin integrity will decrease Outcome: Progressing

## 2023-07-24 NOTE — Anesthesia Preprocedure Evaluation (Signed)
 Anesthesia Evaluation  Patient identified by MRN, date of birth, ID band Patient awake    Reviewed: Allergy & Precautions, NPO status , Patient's Chart, lab work & pertinent test results  History of Anesthesia Complications Negative for: history of anesthetic complications  Airway Mallampati: III  TM Distance: >3 FB Neck ROM: Full   Comment: Facial hair Dental  (+) Dental Advisory Given, Poor Dentition   Pulmonary shortness of breath, neg sleep apnea, neg COPD, neg recent URI   Pulmonary exam normal breath sounds clear to auscultation       Cardiovascular hypertension, Pt. on medications and Pt. on home beta blockers (-) angina + CAD and +CHF  (-) Past MI, (-) Cardiac Stents and (-) CABG + dysrhythmias (1st degree AV block, RBBB) + Valvular Problems/Murmurs (mild MR, severe AS)  Rhythm:Regular Rate:Normal + Systolic murmurs HLD, bilateral carotid artery stenosis   TTE 07/12/2023: IMPRESSIONS    1. Left ventricular ejection fraction, by estimation, is 60 to 65%. The  left ventricle has normal function. The left ventricle has no regional  wall motion abnormalities. There is severe left ventricular hypertrophy.  Left ventricular diastolic parameters   are consistent with Grade II diastolic dysfunction (pseudonormalization).   2. Right ventricular systolic function is normal. The right ventricular  size is mildly enlarged.   3. Left atrial size was severely dilated.   4. A small pericardial effusion is present. The pericardial effusion is  circumferential. There is no evidence of cardiac tamponade.   5. The mitral valve is degenerative. Mild mitral valve regurgitation. No  evidence of mitral stenosis.   6. The aortic valve is tricuspid. There is moderate calcification of the  aortic valve. Aortic valve regurgitation is mild. Moderate to severe  aortic valve stenosis. Aortic valve area, by VTI measures 0.79 cm. Aortic  valve mean  gradient measures 23.8  mmHg. Aortic valve Vmax measures 3.21 m/s.   7. The inferior vena cava is normal in size with greater than 50%  respiratory variability, suggesting right atrial pressure of 3 mmHg.   R/LHC 09/29/2022: 1.  Normal right heart hemodynamics with preserved cardiac output of 8.96 L/min and cardiac index of 4.5 L/min/m (likely high flow with hemodialysis/AV fistula) 2.  Moderate aortic stenosis with mean gradient 20 mmHg, calculated valve area 2.2 cm likely overestimated with high flow state.  Valve crossed with a J-wire. 3.  Patent left main with no stenosis 4.  Patent LAD with moderate nonobstructive stenosis stable from the previous study, moderately severe first diagonal stenosis of 70% 5.  Severe proximal left circumflex stenosis, supplies very small territory myocardium 6.  Patent, large dominant RCA with mild nonobstructive plaquing and severe stenosis of the PDA branch stable from the previous study     Neuro/Psych  Headaches, neg Seizures PSYCHIATRIC DISORDERS         GI/Hepatic Neg liver ROS, PUD,GERD  Medicated,,  Endo/Other  diabetes, Type 2, Insulin  Dependent  Secondary hyperparathyroidism   Renal/GU ESRF and DialysisRenal disease   BPH    Musculoskeletal   Abdominal   Peds  Hematology  (+) Blood dyscrasia, anemia Lab Results      Component                Value               Date                      WBC  9.6                 07/24/2023                HGB                      8.4 (L)             07/24/2023                HCT                      25.3 (L)            07/24/2023                MCV                      87.2                07/24/2023                PLT                      422 (H)             07/24/2023              Anesthesia Other Findings 73 year old man with PMH of ESRD on HD TTS, HFpEF, CAD, AAS, HTN, HLD, T2DM and recent admission from 7/11-7/16 for hemorrhaghic shock 2/2 bleeding fistula during HD who  underwent fistulogram and angioplasty per IR on 7/14.  Patient was called back to the hospital the day after discharge due to positive blood cultures with MSSA growing from cultures drawn on 7/11 (prior admission).    Further evaluation revealed left-sided epidural fluid collection at the L3-L4 level.   Neurosurgery and interventional radiology consulted. Underwent fluoroscopy guided aspiration of left lumbar paraspinal process on 7/18  Hyponatremia   Reproductive/Obstetrics                              Anesthesia Physical Anesthesia Plan  ASA: 4  Anesthesia Plan: MAC   Post-op Pain Management: Minimal or no pain anticipated   Induction: Intravenous  PONV Risk Score and Plan: 1 and Propofol  infusion, TIVA and Treatment may vary due to age or medical condition  Airway Management Planned: Natural Airway and Nasal Cannula  Additional Equipment:   Intra-op Plan:   Post-operative Plan:   Informed Consent: I have reviewed the patients History and Physical, chart, labs and discussed the procedure including the risks, benefits and alternatives for the proposed anesthesia with the patient or authorized representative who has indicated his/her understanding and acceptance.     Dental advisory given  Plan Discussed with: CRNA and Anesthesiologist  Anesthesia Plan Comments: (Plan for phenylephrine  infusion with propofol .  Discussed with patient risks of MAC including, but not limited to, minor pain or discomfort, hearing people in the room, and possible need for backup general anesthesia. Risks for general anesthesia also discussed including, but not limited to, sore throat, hoarse voice, chipped/damaged teeth, injury to vocal cords, nausea and vomiting, allergic reactions, lung infection, heart attack, stroke, and death. All questions answered. )         Anesthesia Quick Evaluation

## 2023-07-25 ENCOUNTER — Encounter (HOSPITAL_COMMUNITY)
Admission: EM | Disposition: A | Discharge status: Home / Self Care | Payer: Self-pay | Source: Home / Self Care | Attending: Student

## 2023-07-25 ENCOUNTER — Inpatient Hospital Stay (HOSPITAL_COMMUNITY): Payer: Self-pay | Admitting: Anesthesiology

## 2023-07-25 ENCOUNTER — Inpatient Hospital Stay (HOSPITAL_COMMUNITY)

## 2023-07-25 ENCOUNTER — Encounter (HOSPITAL_COMMUNITY): Payer: Self-pay | Admitting: Cardiology

## 2023-07-25 DIAGNOSIS — I361 Nonrheumatic tricuspid (valve) insufficiency: Secondary | ICD-10-CM

## 2023-07-25 DIAGNOSIS — I5022 Chronic systolic (congestive) heart failure: Secondary | ICD-10-CM

## 2023-07-25 DIAGNOSIS — I350 Nonrheumatic aortic (valve) stenosis: Secondary | ICD-10-CM

## 2023-07-25 DIAGNOSIS — I132 Hypertensive heart and chronic kidney disease with heart failure and with stage 5 chronic kidney disease, or end stage renal disease: Secondary | ICD-10-CM

## 2023-07-25 DIAGNOSIS — I080 Rheumatic disorders of both mitral and aortic valves: Secondary | ICD-10-CM | POA: Diagnosis not present

## 2023-07-25 DIAGNOSIS — B9561 Methicillin susceptible Staphylococcus aureus infection as the cause of diseases classified elsewhere: Secondary | ICD-10-CM | POA: Diagnosis not present

## 2023-07-25 DIAGNOSIS — I340 Nonrheumatic mitral (valve) insufficiency: Secondary | ICD-10-CM

## 2023-07-25 DIAGNOSIS — R7881 Bacteremia: Secondary | ICD-10-CM | POA: Diagnosis not present

## 2023-07-25 DIAGNOSIS — N186 End stage renal disease: Secondary | ICD-10-CM

## 2023-07-25 HISTORY — PX: TRANSESOPHAGEAL ECHOCARDIOGRAM (CATH LAB): EP1270

## 2023-07-25 LAB — RENAL FUNCTION PANEL
Albumin: 2.1 g/dL — ABNORMAL LOW (ref 3.5–5.0)
Anion gap: 12 (ref 5–15)
BUN: 26 mg/dL — ABNORMAL HIGH (ref 8–23)
CO2: 27 mmol/L (ref 22–32)
Calcium: 7.8 mg/dL — ABNORMAL LOW (ref 8.9–10.3)
Chloride: 94 mmol/L — ABNORMAL LOW (ref 98–111)
Creatinine, Ser: 6.4 mg/dL — ABNORMAL HIGH (ref 0.61–1.24)
GFR, Estimated: 9 mL/min — ABNORMAL LOW (ref >60)
Glucose, Bld: 122 mg/dL — ABNORMAL HIGH (ref 70–99)
Phosphorus: 4.5 mg/dL (ref 2.5–4.6)
Potassium: 4.4 mmol/L (ref 3.5–5.1)
Sodium: 133 mmol/L — ABNORMAL LOW (ref 135–145)

## 2023-07-25 LAB — CULTURE, BLOOD (ROUTINE X 2)
Culture: NO GROWTH
Culture: NO GROWTH

## 2023-07-25 LAB — ECHO TEE
AV Mean grad: 17.5 mmHg
AV Peak grad: 26.9 mmHg
Ao pk vel: 2.6 m/s

## 2023-07-25 LAB — CBC
HCT: 24.2 % — ABNORMAL LOW (ref 39.0–52.0)
Hemoglobin: 7.9 g/dL — ABNORMAL LOW (ref 13.0–17.0)
MCH: 29 pg (ref 26.0–34.0)
MCHC: 32.6 g/dL (ref 30.0–36.0)
MCV: 89 fL (ref 80.0–100.0)
Platelets: 396 K/uL (ref 150–400)
RBC: 2.72 MIL/uL — ABNORMAL LOW (ref 4.22–5.81)
RDW: 14.5 % (ref 11.5–15.5)
WBC: 8.5 K/uL (ref 4.0–10.5)
nRBC: 0 % (ref 0.0–0.2)

## 2023-07-25 LAB — AEROBIC/ANAEROBIC CULTURE W GRAM STAIN (SURGICAL/DEEP WOUND)

## 2023-07-25 LAB — GLUCOSE, CAPILLARY
Glucose-Capillary: 80 mg/dL (ref 70–99)
Glucose-Capillary: 97 mg/dL (ref 70–99)

## 2023-07-25 SURGERY — TRANSESOPHAGEAL ECHOCARDIOGRAM (TEE) (CATHLAB)
Anesthesia: Monitor Anesthesia Care

## 2023-07-25 MED ORDER — AMLODIPINE BESYLATE 10 MG PO TABS: 10.0000 mg | ORAL_TABLET | Freq: Every day | ORAL | 0 refills | Status: AC

## 2023-07-25 MED ORDER — OXYCODONE-ACETAMINOPHEN 5-325 MG PO TABS: 1.0000 | ORAL_TABLET | Freq: Four times a day (QID) | ORAL | 0 refills | Status: DC | PRN

## 2023-07-25 MED ORDER — PHENYLEPHRINE HCL-NACL 20-0.9 MG/250ML-% IV SOLN
INTRAVENOUS | Status: DC | PRN
  Administered 2023-07-25: 50 ug/min via INTRAVENOUS

## 2023-07-25 MED ORDER — PHENYLEPHRINE HCL-NACL 20-0.9 MG/250ML-% IV SOLN: INTRAVENOUS | Status: DC | PRN

## 2023-07-25 MED ORDER — CEFAZOLIN IV (FOR PTA / DISCHARGE USE ONLY): 2.0000 g | INTRAVENOUS | Status: AC

## 2023-07-25 MED ORDER — SODIUM CHLORIDE 0.9 % IV SOLN: INTRAVENOUS | Status: DC

## 2023-07-25 MED ORDER — PANTOPRAZOLE SODIUM 40 MG PO TBEC: 40.0000 mg | DELAYED_RELEASE_TABLET | Freq: Every day | ORAL | 0 refills | Status: AC

## 2023-07-25 MED ORDER — PROPOFOL 500 MG/50ML IV EMUL
INTRAVENOUS | Status: DC | PRN
  Administered 2023-07-25: 100 ug/kg/min via INTRAVENOUS

## 2023-07-25 NOTE — Progress Notes (Signed)
 PROGRESS NOTE    Justin [name]  FMW:[id number] DOB: 09-Jul-1950 DOA: 07/19/2023 PCP: Bernie Lamar PARAS, MD   Brief Narrative:  73 year old man with PMH of ESRD on HD TTS, HFpEF, CAD, AS, HTN, HLD, T2DM and recent admission from 7/11-7/16 for hemorrhaghic shock due to bleeding fistula during HD who underwent fistulogram and angioplasty per IR on 7/14. Patient was called back to the hospital the day after discharge due to positive blood cultures with MSSA growing from cultures drawn on 7/11 (prior admission).  He was found to have left-sided epidural collection at the L3-L4 level.  Neurosurgery and IR consulted.  Underwent fluoroscopy guided aspiration of left lumbar paraspinal process on 7/18.  ID, nephrology and vascular surgery following.  Cardiology recommended EGD by GI due to history of esophagitis before.  Underwent EGD on 07/23/2023 which showed LA grade a esophagitis with no bleeding.  Cardiology planning for TEE today.  Assessment & Plan:   Epidural abscess MRSA bacteremia -Neurosurgery and IR consulted.  Underwent fluoroscopy guided aspiration of left lumbar paraspinal process on 7/18. - Repeat blood cultures negative so far. - ID following.  Currently on IV cefazolin . -Cardiology recommended EGD by GI due to history of esophagitis before.  Underwent EGD on 07/23/2023 which showed LA grade a esophagitis with no bleeding.  Cardiology planning for TEE today.  End-stage renal disease on hemodialysis - Nephrology following.  Dialysis as per nephrology recommendations and schedule  Hypertension Hyperlipidemia - Blood pressure intermittently elevated.  Continue amlodipine , Imdur , metoprolol  succinate - Continue statin  Recent admission for bleeding fistula - Status post fistulogram and angioplasty on 07/16/2023.  Patient had some bleeding from his AV fistula on 07/20/2023.  This was addressed by vascular surgery.  Outpatient follow-up with vascular surgery  Leukocytosis -  Resolved  Anemia of chronic disease -From chronic illnesses and renal failure.  Hemoglobin stable.  Monitor intermittently.  Thrombocytosis -resolved  Hyponatremia - Managed by hemodialysis by nephrology.  Esophagitis -Continue Protonix   Diabetes mellitus type 2 with hyperglycemia -Will continue long-acting insulin  along with CBGs with SSI  Hypoalbuminemia -from poor oral intake.  Encourage oral intake.  Follow nutrition recommendations  Physical deconditioning - PT eval  DVT prophylaxis: Heparin  subcutaneous Code Status: Full Family Communication: None at bedside Disposition Plan: Status is: Inpatient Remains inpatient appropriate because: Of severity of illness    Consultants: ID/nephrology/vascular surgery/cardiology/GI  Procedures: As above  Antimicrobials: Ancef  from 07/19/2023   Subjective: Patient seen and examined at bedside.  Poor historian.  No fever, seizures, agitation reported.  Objective: Vitals:   07/25/23 0833 07/25/23 0843 07/25/23 0853 07/25/23 0945  BP: (!) 140/61 (!) 154/61 (!) 183/63 (!) 162/66  Pulse: (!) 56 (!) 55 (!) 57 (!) 57  Resp: 20 17 14 18   Temp: 97.7 F (36.5 C)   97.7 F (36.5 C)  TempSrc: Tympanic   Oral  SpO2: 95% 96% 99% 99%  Weight:      Height:        Intake/Output Summary (Last 24 hours) at 07/25/2023 1133 Last data filed at 07/25/2023 0825 Gross per 24 hour  Intake 1520 ml  Output 195 ml  Net 1325 ml   Filed Weights   07/21/23 2149 07/22/23 0120 07/24/23 1149  Weight: 78.3 kg 76.2 kg 77.9 kg    Examination:  General exam: Appears calm and comfortable.  Looks chronically ill in the condition. Respiratory system: Bilateral decreased breath sounds at bases with scattered crackles Cardiovascular system: S1 & S2 heard, but in the bradycardic  Gastrointestinal system: Abdomen is nondistended, soft and nontender. Normal bowel sounds heard. Extremities: No cyanosis, clubbing; trace lower extremity edema  present Central nervous system:.  Slightly, slow to respond.  Poor historian.  No focal neurological deficits. Moving extremities Skin: No rashes, lesions or ulcers Psychiatry: Flat affect.  Not agitated.    Data Reviewed: I have personally reviewed following labs and imaging studies  CBC: Recent Labs  Lab 07/19/23 1134 07/20/23 0409 07/21/23 1307 07/22/23 1022 07/23/23 0539 07/24/23 0502 07/25/23 0509  WBC 12.4*   < > 9.6 8.7 8.2 9.6 8.5  NEUTROABS 10.3*  --   --   --   --   --   --   HGB 9.0*   < > 8.1* 7.9* 8.3* 8.4* 7.9*  HCT 26.8*   < > 23.9* 23.7* 25.2* 25.3* 24.2*  MCV 88.2   < > 85.1 86.5 87.5 87.2 89.0  PLT 298   < > 321 354 384 422* 396   < > = values in this interval not displayed.   Basic Metabolic Panel: Recent Labs  Lab 07/21/23 1307 07/22/23 1022 07/23/23 0539 07/24/23 0502 07/25/23 0509  NA 126* 132* 132* 128* 133*  K 4.0 4.2 4.5 4.5 4.4  CL 88* 94* 92* 89* 94*  CO2 20* 25 22 21* 27  GLUCOSE 99 154* 87 110* 122*  BUN 51* 27* 36* 43* 26*  CREATININE 11.08* 7.23* 8.18* 9.54* 6.40*  CALCIUM  7.3* 7.6* 7.5* 7.5* 7.8*  PHOS 9.1* 5.9* 6.5* 6.5* 4.5   GFR: Estimated Creatinine Clearance: 10.1 mL/min (A) (by C-G formula based on SCr of 6.4 mg/dL (H)). Liver Function Tests: Recent Labs  Lab 07/19/23 1134 07/21/23 1307 07/22/23 1022 07/23/23 0539 07/24/23 0502 07/25/23 0509  AST 49*  --   --   --   --   --   ALT 81*  --   --   --   --   --   ALKPHOS 88  --   --   --   --   --   BILITOT 0.7  --   --   --   --   --   PROT 6.6  --   --   --   --   --   ALBUMIN  2.7* 2.3* 2.1* 2.3* 2.4* 2.1*   No results for input(s): LIPASE, AMYLASE in the last 168 hours. No results for input(s): AMMONIA in the last 168 hours. Coagulation Profile: Recent Labs  Lab 07/19/23 1134  INR 1.3*   Cardiac Enzymes: No results for input(s): CKTOTAL, CKMB, CKMBINDEX, TROPONINI in the last 168 hours. BNP (last 3 results) No results for input(s): PROBNP in  the last 8760 hours. HbA1C: No results for input(s): HGBA1C in the last 72 hours. CBG: Recent Labs  Lab 07/23/23 2028 07/24/23 1227 07/24/23 1625 07/24/23 2025 07/25/23 0942  GLUCAP 188* 112* 141* 138* 80   Lipid Profile: No results for input(s): CHOL, HDL, LDLCALC, TRIG, CHOLHDL, LDLDIRECT in the last 72 hours. Thyroid Function Tests: No results for input(s): TSH, T4TOTAL, FREET4, T3FREE, THYROIDAB in the last 72 hours. Anemia Panel: No results for input(s): VITAMINB12, FOLATE, FERRITIN, TIBC, IRON , RETICCTPCT in the last 72 hours. Sepsis Labs: Recent Labs  Lab 07/19/23 1147 07/19/23 1800  LATICACIDVEN 0.9 0.8    Recent Results (from the past 240 hours)  Blood Culture (routine x 2)     Status: Abnormal   Collection Time: 07/19/23 11:34 AM   Specimen: BLOOD  Result Value Ref Range  Status   Specimen Description BLOOD RIGHT ANTECUBITAL  Final   Special Requests   Final    BOTTLES DRAWN AEROBIC AND ANAEROBIC Blood Culture adequate volume   Culture  Setup Time   Final    GRAM POSITIVE COCCI IN CLUSTERS AEROBIC BOTTLE ONLY CRITICAL RESULT CALLED TO, READ BACK BY AND VERIFIED WITH: PHARMD C.  AMAND [id number] FCP Performed at [facility], [address], [place], [place] [address]    Culture STAPHYLOCOCCUS AUREUS (A)  Final   Report Status 07/22/2023 FINAL  Final   Organism ID, Bacteria STAPHYLOCOCCUS AUREUS  Final      Susceptibility   Staphylococcus aureus - MIC*    CIPROFLOXACIN <=0.5 SENSITIVE Sensitive     ERYTHROMYCIN <=0.25 SENSITIVE Sensitive     GENTAMICIN <=0.5 SENSITIVE Sensitive     OXACILLIN <=0.25 SENSITIVE Sensitive     TETRACYCLINE <=1 SENSITIVE Sensitive     VANCOMYCIN  1 SENSITIVE Sensitive     TRIMETH/SULFA <=10 SENSITIVE Sensitive     CLINDAMYCIN <=0.25 SENSITIVE Sensitive     RIFAMPIN <=0.5 SENSITIVE Sensitive     Inducible Clindamycin NEGATIVE Sensitive     LINEZOLID 2 SENSITIVE Sensitive     *  STAPHYLOCOCCUS AUREUS  Aerobic/Anaerobic Culture w Gram Stain (surgical/deep wound)     Status: None   Collection Time: 07/20/23  4:36 PM   Specimen: Abscess  Result Value Ref Range Status   Specimen Description ABSCESS  Final   Special Requests LUMBAR PARA SPINAL  Final   Gram Stain   Final    ABUNDANT WBC PRESENT, PREDOMINANTLY PMN FEW GRAM POSITIVE COCCI IN CLUSTERS    Culture   Final    ABUNDANT STAPHYLOCOCCUS AUREUS NO ANAEROBES ISOLATED Performed at [facility], [address] [place], [place] [address]    Report Status 07/25/2023 FINAL  Final   Organism ID, Bacteria STAPHYLOCOCCUS AUREUS  Final      Susceptibility   Staphylococcus aureus - MIC*    CIPROFLOXACIN <=0.5 SENSITIVE Sensitive     ERYTHROMYCIN <=0.25 SENSITIVE Sensitive     GENTAMICIN <=0.5 SENSITIVE Sensitive     OXACILLIN <=0.25 SENSITIVE Sensitive     TETRACYCLINE <=1 SENSITIVE Sensitive     VANCOMYCIN  1 SENSITIVE Sensitive     TRIMETH/SULFA <=10 SENSITIVE Sensitive     CLINDAMYCIN <=0.25 SENSITIVE Sensitive     RIFAMPIN <=0.5 SENSITIVE Sensitive     Inducible Clindamycin NEGATIVE Sensitive     LINEZOLID 2 SENSITIVE Sensitive     * ABUNDANT STAPHYLOCOCCUS AUREUS  Culture, blood (Routine X 2) w Reflex to ID Panel     Status: None   Collection Time: 07/20/23  5:41 PM   Specimen: BLOOD RIGHT ARM  Result Value Ref Range Status   Specimen Description BLOOD RIGHT ARM  Final   Special Requests   Final    BOTTLES DRAWN AEROBIC AND ANAEROBIC Blood Culture results may not be optimal due to an inadequate volume of blood received in culture bottles   Culture   Final    NO GROWTH 5 DAYS Performed at [facility], [address], [place], [place] [address]    Report Status 07/25/2023 FINAL  Final  Culture, blood (Routine X 2) w Reflex to ID Panel     Status: None   Collection Time: 07/20/23  5:42 PM   Specimen: BLOOD RIGHT HAND  Result Value Ref Range Status   Specimen Description BLOOD RIGHT HAND   Final   Special Requests  Final    BOTTLES DRAWN AEROBIC ONLY Blood Culture results may not be optimal due to an inadequate volume of blood received in culture bottles   Culture   Final    NO GROWTH 5 DAYS Performed at [facility], [address] [place], [place] [address]    Report Status 07/25/2023 FINAL  Final  Culture, blood (Routine X 2) w Reflex to ID Panel     Status: None (Preliminary result)   Collection Time: 07/23/23  5:39 AM   Specimen: BLOOD  Result Value Ref Range Status   Specimen Description BLOOD BLOOD RIGHT ARM  Final   Special Requests   Final    BOTTLES DRAWN AEROBIC AND ANAEROBIC Blood Culture results may not be optimal due to an inadequate volume of blood received in culture bottles   Culture   Final    NO GROWTH 2 DAYS Performed at [facility], [address], [place], [place] [address]    Report Status PENDING  Incomplete  Culture, blood (Routine X 2) w Reflex to ID Panel     Status: None (Preliminary result)   Collection Time: 07/23/23  5:43 AM   Specimen: BLOOD  Result Value Ref Range Status   Specimen Description BLOOD BLOOD RIGHT ARM  Final   Special Requests   Final    BOTTLES DRAWN AEROBIC AND ANAEROBIC Blood Culture adequate volume   Culture   Final    NO GROWTH 2 DAYS Performed at [facility], [address], [place], [place] [address]    Report Status PENDING  Incomplete         Radiology Studies: ECHO TEE Result Date: 07/25/2023    TRANSESOPHOGEAL ECHO REPORT   Patient Name:   Justin [name] Date of Exam: 07/25/2023 Medical Rec #:  [id number]     Height:       68.0 in Accession #:    [id number]    Weight:       171.7 lb Date of Birth:  [birth year]      BSA:          1.916 m Patient Age:    73 years      BP:           185/69 mmHg Patient Gender: M             HR:           46 bpm. Exam Location:  Inpatient Procedure: Transesophageal Echo, Cardiac Doppler and Color Doppler (Both            Spectral and Color Flow Doppler were  utilized during procedure). Indications:     Endocarditis  History:         Patient has prior history of Echocardiogram examinations, most                  recent 07/11/2023. CHF, CAD, Aortic Valve Disease; Risk                  Factors:Hypertension and Dyslipidemia.  Sonographer:     Philomena Daring Referring Phys:  [id number] UJBONM A PARCELLS Diagnosing Phys: Oneil Parchment MD PROCEDURE: After discussion of the risks and benefits of a TEE, an informed consent was obtained from the patient. The transesophogeal probe was passed without difficulty through the esophogus of the patient. Imaged were obtained with the patient in a left lateral decubitus position. Sedation performed by different physician. The patient was monitored while under deep sedation.  Anesthestetic sedation was provided intravenously by Anesthesiology: 188mg  of Propofol . The patient's vital signs; including heart rate, blood pressure, and oxygen  saturation; remained stable throughout the procedure. The patient developed no complications during the procedure.  IMPRESSIONS  1. Left ventricular ejection fraction, by estimation, is 55 to 60%. The left ventricle has normal function. The left ventricle has no regional wall motion abnormalities.  2. Right ventricular systolic function is normal. The right ventricular size is normal.  3. No left atrial/left atrial appendage thrombus was detected.  4. A small pericardial effusion is present. The pericardial effusion is posterior to the left ventricle.  5. The mitral valve is normal in structure. Mild mitral valve regurgitation. No evidence of mitral stenosis.  6. The aortic valve is tricuspid. There is moderate calcification of the aortic valve. There is moderate thickening of the aortic valve. Aortic valve regurgitation is mild. Moderate aortic valve stenosis. Aortic valve mean gradient measures 17.5 mmHg. Aortic valve Vmax measures 2.60 m/s.  7. There is mild (Grade II) plaque.  8. The inferior vena cava is normal  in size with greater than 50% respiratory variability, suggesting right atrial pressure of 3 mmHg. Conclusion(s)/Recommendation(s): No evidence of vegetation/infective endocarditis on this transesophageael echocardiogram. FINDINGS  Left Ventricle: Left ventricular ejection fraction, by estimation, is 55 to 60%. The left ventricle has normal function. The left ventricle has no regional wall motion abnormalities. The left ventricular internal cavity size was normal in size. There is  no left ventricular hypertrophy. Right Ventricle: The right ventricular size is normal. No increase in right ventricular wall thickness. Right ventricular systolic function is normal. Left Atrium: Left atrial size was normal in size. No left atrial/left atrial appendage thrombus was detected. Right Atrium: Right atrial size was normal in size. Pericardium: A small pericardial effusion is present. The pericardial effusion is posterior to the left ventricle. Mitral Valve: The mitral valve is normal in structure. Mild mitral valve regurgitation. No evidence of mitral valve stenosis. Tricuspid Valve: The tricuspid valve is normal in structure. Tricuspid valve regurgitation is mild . No evidence of tricuspid stenosis. Aortic Valve: The aortic valve is tricuspid. There is moderate calcification of the aortic valve. There is moderate thickening of the aortic valve. Aortic valve regurgitation is mild. Moderate aortic stenosis is present. Aortic valve mean gradient measures 17.5 mmHg. Aortic valve peak gradient measures 26.9 mmHg. Pulmonic Valve: The pulmonic valve was normal in structure. Pulmonic valve regurgitation is not visualized. No evidence of pulmonic stenosis. Aorta: The aortic root is normal in size and structure. There is mild (Grade II) plaque. Venous: The inferior vena cava is normal in size with greater than 50% respiratory variability, suggesting right atrial pressure of 3 mmHg. IAS/Shunts: No atrial level shunt detected by color  flow Doppler. Additional Comments: Spectral Doppler performed. LEFT VENTRICLE PLAX 2D LVOT diam:     2.30 cm LVOT Area:     4.15 cm  AORTIC VALVE AV Vmax:      259.50 cm/s AV Vmean:     201.000 cm/s AV VTI:       0.682 m AV Peak Grad: 26.9 mmHg AV Mean Grad: 17.5 mmHg  SHUNTS Systemic Diam: 2.30 cm Oneil Parchment MD Electronically signed by Oneil Parchment MD Signature Date/Time: 07/25/2023/9:51:04 AM    Final (Updated)    EP STUDY Result Date: 07/25/2023 See surgical note for result.       Scheduled Meds:  (feeding supplement) PROSource Plus  30 mL Oral BID BM   acetaminophen   1,000  mg Oral TID   amLODipine   10 mg Oral Daily   aspirin  EC  81 mg Oral Daily   Chlorhexidine  Gluconate Cloth  6 each Topical Q0600   darbepoetin (ARANESP ) injection - DIALYSIS  200 mcg Subcutaneous Q Sun-1800   ferric citrate   630 mg Oral TID WC   heparin  injection (subcutaneous)  5,000 Units Subcutaneous Q8H   insulin  aspart  0-15 Units Subcutaneous TID WC   insulin  glargine-yfgn  20 Units Subcutaneous QHS   isosorbide  mononitrate  30 mg Oral Once per day on Sunday Tuesday Thursday Saturday   methocarbamol   500 mg Oral TID   metoprolol  succinate  50 mg Oral Daily   ondansetron  (ZOFRAN ) IV  4 mg Intravenous Once   pantoprazole   40 mg Oral Daily   rosuvastatin   40 mg Oral Daily   Continuous Infusions:   ceFAZolin  (ANCEF ) IV 1 g (07/24/23 1708)          Sophie Mao, MD [facility] 07/25/2023, 11:33 AM

## 2023-07-25 NOTE — Interval H&P Note (Signed)
 History and Physical Interval Note:  07/25/2023 7:42 AM  Justin [name]  has presented today for surgery, with the diagnosis of Bacteremia.  The various methods of treatment have been discussed with the patient and family. After consideration of risks, benefits and other options for treatment, the patient has consented to  Procedure(s): TRANSESOPHAGEAL ECHOCARDIOGRAM (N/A) as a surgical intervention.  The patient's history has been reviewed, patient examined, no change in status, stable for surgery.  I have reviewed the patient's chart and labs.  Questions were answered to the patient's satisfaction.     [employer]

## 2023-07-25 NOTE — Progress Notes (Signed)
 Pt has been NPO since midnight for TEE this am. RN tried to get patient to sign consent, however pt stated he has not spoke with the MD yet about it. Consent form was flagged in chart until pt can speak with the MD to sign it.   Bari HERO Justin [name]

## 2023-07-25 NOTE — Progress Notes (Signed)
 [facility] Progress Note   Subjective:    Off the floor earlier today 2nd scheduled TEE which showed no vegetation. Seen and examined patient at bedside. Patient's wife at bedside. Both expressed frustration and wanting to go home. Discussed with pharmacy. Patient will be on ABXs with HD X 8 weeks until 09/14/23. Reached out to Hospitalist to inquire on anticipated dc date. Removed 1.4L with yesterday's HD.  Objective Vitals:   07/25/23 0833 07/25/23 0843 07/25/23 0853 07/25/23 0945  BP: (!) 140/61 (!) 154/61 (!) 183/63 (!) 162/66  Pulse: (!) 56 (!) 55 (!) 57 (!) 57  Resp: 20 17 14 18   Temp: 97.7 F (36.5 C)   97.7 F (36.5 C)  TempSrc: Tympanic   Oral  SpO2: 95% 96% 99% 99%  Weight:      Height:       Physical Exam General: Chronically ill appearing male in NAD Heart: RRR Lungs: CTAB, nml WOB on RA Abdomen: Soft, NTND Extremities: No LE edema Dialysis Access: LU AVF +b/t, no blood present   [facility] Weights   07/21/23 2149 07/22/23 0120 07/24/23 1149  Weight: 78.3 kg 76.2 kg 77.9 kg    Intake/Output Summary (Last 24 hours) at 07/25/2023 1610 Last data filed at 07/25/2023 1154 Gross per 24 hour  Intake 1220 ml  Output 195 ml  Net 1025 ml    Additional Objective Labs: Basic Metabolic Panel: Recent Labs  Lab 07/23/23 0539 07/24/23 0502 07/25/23 0509  NA 132* 128* 133*  K 4.5 4.5 4.4  CL 92* 89* 94*  CO2 22 21* 27  GLUCOSE 87 110* 122*  BUN 36* 43* 26*  CREATININE 8.18* 9.54* 6.40*  CALCIUM  7.5* 7.5* 7.8*  PHOS 6.5* 6.5* 4.5   Liver Function Tests: Recent Labs  Lab 07/19/23 1134 07/21/23 1307 07/23/23 0539 07/24/23 0502 07/25/23 0509  AST 49*  --   --   --   --   ALT 81*  --   --   --   --   ALKPHOS 88  --   --   --   --   BILITOT 0.7  --   --   --   --   PROT 6.6  --   --   --   --   ALBUMIN  2.7*   < > 2.3* 2.4* 2.1*   < > = values in this interval not displayed.   No results for input(s): LIPASE, AMYLASE in the last 168  hours. CBC: Recent Labs  Lab 07/19/23 1134 07/20/23 0409 07/21/23 1307 07/22/23 1022 07/23/23 0539 07/24/23 0502 07/25/23 0509  WBC 12.4*   < > 9.6 8.7 8.2 9.6 8.5  NEUTROABS 10.3*  --   --   --   --   --   --   HGB 9.0*   < > 8.1* 7.9* 8.3* 8.4* 7.9*  HCT 26.8*   < > 23.9* 23.7* 25.2* 25.3* 24.2*  MCV 88.2   < > 85.1 86.5 87.5 87.2 89.0  PLT 298   < > 321 354 384 422* 396   < > = values in this interval not displayed.   Blood Culture    Component Value Date/Time   SDES BLOOD BLOOD RIGHT ARM 07/23/2023 0543   SPECREQUEST  07/23/2023 0543    BOTTLES DRAWN AEROBIC AND ANAEROBIC Blood Culture adequate volume   CULT  07/23/2023 0543    NO GROWTH 2 DAYS Performed at [facility], [address], [place], [place] [address]  REPTSTATUS PENDING 07/23/2023 0543    Cardiac Enzymes: No results for input(s): CKTOTAL, CKMB, CKMBINDEX, TROPONINI in the last 168 hours. CBG: Recent Labs  Lab 07/24/23 1227 07/24/23 1625 07/24/23 2025 07/25/23 0942 07/25/23 1140  GLUCAP 112* 141* 138* 80 97   Iron  Studies: No results for input(s): IRON , TIBC, TRANSFERRIN, FERRITIN in the last 72 hours. Lab Results  Component Value Date   INR 1.3 (H) 07/19/2023   INR 1.6 (H) 07/13/2023   INR 1.1 02/16/2023   Studies/Results: ECHO TEE Result Date: 07/25/2023    TRANSESOPHOGEAL ECHO REPORT   Patient Name:   RAFI KENNETH Date of Exam: 07/25/2023 Medical Rec #:  [id number]     Height:       68.0 in Accession #:    [id number]    Weight:       171.7 lb Date of Birth:  1950/03/05      BSA:          1.916 m Patient Age:    73 years      BP:           185/69 mmHg Patient Gender: M             HR:           46 bpm. Exam Location:  Inpatient Procedure: Transesophageal Echo, Cardiac Doppler and Color Doppler (Both            Spectral and Color Flow Doppler were utilized during procedure). Indications:     Endocarditis  History:         Patient has prior history of Echocardiogram  examinations, most                  recent 07/11/2023. CHF, CAD, Aortic Valve Disease; Risk                  Factors:Hypertension and Dyslipidemia.  Sonographer:     Philomena Daring Referring Phys:  [id number] UJBONM A PARCELLS Diagnosing Phys: Oneil Parchment MD PROCEDURE: After discussion of the risks and benefits of a TEE, an informed consent was obtained from the patient. The transesophogeal probe was passed without difficulty through the esophogus of the patient. Imaged were obtained with the patient in a left lateral decubitus position. Sedation performed by different physician. The patient was monitored while under deep sedation. Anesthestetic sedation was provided intravenously by Anesthesiology: 188mg  of Propofol . The patient's vital signs; including heart rate, blood pressure, and oxygen  saturation; remained stable throughout the procedure. The patient developed no complications during the procedure.  IMPRESSIONS  1. Left ventricular ejection fraction, by estimation, is 55 to 60%. The left ventricle has normal function. The left ventricle has no regional wall motion abnormalities.  2. Right ventricular systolic function is normal. The right ventricular size is normal.  3. No left atrial/left atrial appendage thrombus was detected.  4. A small pericardial effusion is present. The pericardial effusion is posterior to the left ventricle.  5. The mitral valve is normal in structure. Mild mitral valve regurgitation. No evidence of mitral stenosis.  6. The aortic valve is tricuspid. There is moderate calcification of the aortic valve. There is moderate thickening of the aortic valve. Aortic valve regurgitation is mild. Moderate aortic valve stenosis. Aortic valve mean gradient measures 17.5 mmHg. Aortic valve Vmax measures 2.60 m/s.  7. There is mild (Grade II) plaque.  8. The inferior vena cava is normal in size with greater than 50% respiratory variability, suggesting right atrial pressure of 3  mmHg.  Conclusion(s)/Recommendation(s): No evidence of vegetation/infective endocarditis on this transesophageael echocardiogram. FINDINGS  Left Ventricle: Left ventricular ejection fraction, by estimation, is 55 to 60%. The left ventricle has normal function. The left ventricle has no regional wall motion abnormalities. The left ventricular internal cavity size was normal in size. There is  no left ventricular hypertrophy. Right Ventricle: The right ventricular size is normal. No increase in right ventricular wall thickness. Right ventricular systolic function is normal. Left Atrium: Left atrial size was normal in size. No left atrial/left atrial appendage thrombus was detected. Right Atrium: Right atrial size was normal in size. Pericardium: A small pericardial effusion is present. The pericardial effusion is posterior to the left ventricle. Mitral Valve: The mitral valve is normal in structure. Mild mitral valve regurgitation. No evidence of mitral valve stenosis. Tricuspid Valve: The tricuspid valve is normal in structure. Tricuspid valve regurgitation is mild . No evidence of tricuspid stenosis. Aortic Valve: The aortic valve is tricuspid. There is moderate calcification of the aortic valve. There is moderate thickening of the aortic valve. Aortic valve regurgitation is mild. Moderate aortic stenosis is present. Aortic valve mean gradient measures 17.5 mmHg. Aortic valve peak gradient measures 26.9 mmHg. Pulmonic Valve: The pulmonic valve was normal in structure. Pulmonic valve regurgitation is not visualized. No evidence of pulmonic stenosis. Aorta: The aortic root is normal in size and structure. There is mild (Grade II) plaque. Venous: The inferior vena cava is normal in size with greater than 50% respiratory variability, suggesting right atrial pressure of 3 mmHg. IAS/Shunts: No atrial level shunt detected by color flow Doppler. Additional Comments: Spectral Doppler performed. LEFT VENTRICLE PLAX 2D LVOT diam:      2.30 cm LVOT Area:     4.15 cm  AORTIC VALVE AV Vmax:      259.50 cm/s AV Vmean:     201.000 cm/s AV VTI:       0.682 m AV Peak Grad: 26.9 mmHg AV Mean Grad: 17.5 mmHg  SHUNTS Systemic Diam: 2.30 cm Oneil Parchment MD Electronically signed by Oneil Parchment MD Signature Date/Time: 07/25/2023/9:51:04 AM    Final (Updated)    EP STUDY Result Date: 07/25/2023 See surgical note for result.   Medications:   ceFAZolin  (ANCEF ) IV 1 g (07/24/23 1708)    (feeding supplement) PROSource Plus  30 mL Oral BID BM   acetaminophen   1,000 mg Oral TID   amLODipine   10 mg Oral Daily   aspirin  EC  81 mg Oral Daily   Chlorhexidine  Gluconate Cloth  6 each Topical Q0600   darbepoetin (ARANESP ) injection - DIALYSIS  200 mcg Subcutaneous Q Sun-1800   ferric citrate   630 mg Oral TID WC   heparin  injection (subcutaneous)  5,000 Units Subcutaneous Q8H   insulin  aspart  0-15 Units Subcutaneous TID WC   insulin  glargine-yfgn  20 Units Subcutaneous QHS   isosorbide  mononitrate  30 mg Oral Once per day on Sunday Tuesday Thursday Saturday   methocarbamol   500 mg Oral TID   metoprolol  succinate  50 mg Oral Daily   ondansetron  (ZOFRAN ) IV  4 mg Intravenous Once   pantoprazole   40 mg Oral Daily   rosuvastatin   40 mg Oral Daily    Dialysis Orders: TTS - [facility] 4 hours, 450/A1.5, EDW 76kg, 2K/2.5Ca bath, AVF, no heparin  - Mircera 150mcg IV q 2 weeks (ordered, not get given) - last got Aranesp  on 7/13 - Hectoral 1mcg IV q HD  Assessment/Plan: MSSA bacteremia with associated LS epidural phlegmon  and paraspinal abscess: IR aspirated 7/19 with initial culture showing gram+ bacteria.  AVF US  without abscess. ID & Neurosurgery consulted. S/p EGD 7/21-found esophagitis with no bleeding. S/p TEE today which showed no vegetation, and EF 55%. Discussed with pharmacy: patient will be on Cefazolin  2GM IV X 8 weeks until 09/14/23. ESRD:  Usual TTS schedule -Next HD 7/24 in outpatient. AVF bleeding - d/t removing scab.  seen by  Vascular, stitch placed. Informed of mild bleeding from AVF this morning but resolved quickly.  Advised not to scratch AVF, keep bandage in place between treatment if needed to help prevent scratching.  Important to hold manual compression for 10-44min post treatment. Hypertension/volume: BP in goal. Continue home meds. UF as tolerated. Anemia: ESA resumed q Sunday this admit.  No IV iron  due to infection. Hgb 8.4 Metabolic bone disease: CorrCa 8.7 and phos elevated - resumed binders today. Nutrition:  Alb low, on protein supplements. T2DM Esophagitis - S/p EGD 7/21 showed esophagitis without bleeding.  Dispo - Okay for discharge from a renal standpoint.  Charmaine Piety, NP [facility] 07/25/2023,4:10 PM  LOS: 6 days

## 2023-07-25 NOTE — Progress Notes (Signed)
 D/C order noted. Contacted [facility] to be advised of pt's d/c today and that pt should resume care tomorrow. Renal NP to send orders to clinic for iv abx with HD at d/c.   Randine Mungo Dialysis Navigator [phone]

## 2023-07-25 NOTE — CV Procedure (Signed)
   Transesophageal Echocardiogram  Indications: MSSA bacteremia, end-stage renal disease, fever  Time out performed  During this procedure the patient was administered propofol  under anesthesiology supervision to achieve and maintain moderate sedation.  The patient's heart rate, blood pressure, and oxygen  saturation are monitored continuously during the procedure.   Findings:  Left Ventricle: Normal EF 55%  Mitral Valve: Mild mitral regurgitation, no vegetation  Aortic Valve: Trileaflet aortic valve with moderate calcification.  Moderate aortic stenosis.  Peak velocity 2.5 to 2.8 m/s, no vegetation  Tricuspid Valve: Trivial TR, no vegetation  Left Atrium: Normal, no left atrial appendage thrombus  Right Atrium: Normal  Intraatrial septum: Normal no shunt  Aorta: Aortic atherosclerosis noted   Impression: No vegetation  Oneil Parchment, MD

## 2023-07-25 NOTE — Anesthesia Postprocedure Evaluation (Signed)
 Anesthesia Post Note  Patient: Justin [name]  Procedure(s) Performed: TRANSESOPHAGEAL ECHOCARDIOGRAM     Patient location during evaluation: PACU Anesthesia Type: MAC Level of consciousness: awake Pain management: pain level controlled Vital Signs Assessment: post-procedure vital signs reviewed and stable Respiratory status: spontaneous breathing, nonlabored ventilation and respiratory function stable Cardiovascular status: stable and blood pressure returned to baseline Postop Assessment: no apparent nausea or vomiting Anesthetic complications: no   No notable events documented.  Last Vitals:  Vitals:   07/25/23 0843 07/25/23 0853  BP: (!) 154/61 (!) 183/63  Pulse: (!) 55 (!) 57  Resp: 17 14  Temp:    SpO2: 96% 99%    Last Pain:  Vitals:   07/25/23 0936  TempSrc:   PainSc: 7                  Delon Aisha Arch

## 2023-07-25 NOTE — Progress Notes (Signed)
 PHARMACY CONSULT NOTE FOR:  Antibiotics with Hemodialysis   Indication: MSSA bacteremia/back infection  Regimen: Cefazolin  2 gm with HD TTHSa End date: 09/14/23  No formal OPAT will be done as patient will receive antibiotics with HD. Nephrology aware. Informational discharge order will be placed.     Thank you for allowing pharmacy to be a part of this patient's care.  [name], PharmD, BCPS, BCIDP Infectious Diseases Clinical Pharmacist Phone: [phone] 07/25/2023, 12:09 PM

## 2023-07-25 NOTE — Discharge Summary (Signed)
 Physician Discharge Summary  Justin [name] FMW:[id number] DOB: 1950/01/29 DOA: 07/19/2023  PCP: Bernie Lamar PARAS, MD  Admit date: 07/19/2023 Discharge date: 07/25/2023  Admitted From: Home Disposition: Home  Recommendations for Outpatient Follow-up:  Follow up with PCP in 1 week with repeat CBC/BMP Outpatient follow-up with dialysis unit as scheduled Outpatient follow-up with ID Follow up in ED if symptoms worsen or new appear   Home Health: No Equipment/Devices: None  Discharge Condition: Stable CODE STATUS: Full Diet recommendation: Heart healthy/carb modified/renal diet  Brief/Interim Summary: 73 year old man with PMH of ESRD on HD TTS, HFpEF, CAD, AS, HTN, HLD, T2DM and recent admission from 7/11-7/16 for hemorrhaghic shock due to bleeding fistula during HD who underwent fistulogram and angioplasty per IR on 7/14. Patient was called back to the hospital the day after discharge due to positive blood cultures with MSSA growing from cultures drawn on 7/11 (prior admission).  He was found to have left-sided epidural collection at the L3-L4 level.  Neurosurgery and IR consulted.  Underwent fluoroscopy guided aspiration of left lumbar paraspinal process on 7/18.  ID, nephrology and vascular surgery following.  Cardiology recommended EGD by GI due to history of esophagitis before.  Underwent EGD on 07/23/2023 which showed LA grade a esophagitis with no bleeding.  Patient underwent TEE today which was negative for vegetation.  Nephrology and ID have cleared the patient for discharge.He will need to be on cefazolin  with HD for 8 weeks till 09/14/2023.  Discharge patient home today.  Discharge Diagnoses:   Epidural abscess MRSA bacteremia -Neurosurgery and IR consulted.  Underwent fluoroscopy guided aspiration of left lumbar paraspinal process on 7/18. - Repeat blood cultures negative so far. - ID following.  Currently on IV cefazolin . -Cardiology recommended EGD by GI due to history of  esophagitis before.  Underwent EGD on 07/23/2023 which showed LA grade a esophagitis with no bleeding.  TEE negative for vegetation. -He will need to be on cefazolin  with HD for 8 weeks till 09/14/2023 as per ID.  Patient was to go home today.  ID has cleared the patient for discharge.  Outpatient follow-up with ID.  Discharge patient home today.  End-stage renal disease on hemodialysis - Nephrology following.  Dialysis as per nephrology recommendations and schedule.  Outpatient follow-up with hemodialysis unit as scheduled.  Nephrology has cleared the patient for discharge.   Hypertension Hyperlipidemia - Blood pressure intermittently elevated.  Continue amlodipine , Imdur , metoprolol  succinate and hydralazine  - Continue statin   Recent admission for bleeding fistula - Status post fistulogram and angioplasty on 07/16/2023.  Patient had some bleeding from his AV fistula on 07/20/2023.  This was addressed by vascular surgery.  Outpatient follow-up with vascular surgery   Leukocytosis - Resolved   Anemia of chronic disease -From chronic illnesses and renal failure.  Hemoglobin stable.  Monitor intermittently as an outpatient.   Thrombocytosis -resolved   Hyponatremia - Managed by hemodialysis by nephrology.  Outpatient follow-up.   Esophagitis -Continue Protonix    Diabetes mellitus type 2 with hyperglycemia - Continue home regimen.  Carb modified diet  Hypoalbuminemia -from poor oral intake.  Encourage oral intake.    Discharge Instructions  Discharge Instructions     Diet - low sodium heart healthy   Complete by: As directed    Diet Carb Modified   Complete by: As directed    Home infusion instructions   Complete by: As directed    Instructions: Flushing of vascular access device: 0.9% NaCl pre/post medication administration and prn patency; Heparin   100 u/ml, 5ml for implanted ports and Heparin  10u/ml, 5ml for all other central venous catheters.   Increase activity slowly    Complete by: As directed    No wound care   Complete by: As directed       Allergies as of 07/25/2023   No Known Allergies      Medication List     STOP taking these medications    ondansetron  4 MG disintegrating tablet Commonly known as: ZOFRAN -ODT       TAKE these medications    acetaminophen  500 MG tablet Commonly known as: TYLENOL  Take 2 tablets (1,000 mg total) by mouth every 6 (six) hours as needed for mild pain (pain score 1-3), fever or headache.   amLODipine  10 MG tablet Commonly known as: NORVASC  Take 1 tablet (10 mg total) by mouth daily. Start taking on: July 26, 2023 What changed:  how much to take Another medication with the same name was removed. Continue taking this medication, and follow the directions you see here.   aspirin  EC 81 MG tablet Take 81 mg by mouth daily. Swallow whole.   ceFAZolin  IVPB Commonly known as: ANCEF  Inject 2 g into the vein Every Tuesday,Thursday,and Saturday with dialysis. Indication:  MSSA bacteremia/back infection  First Dose: Yes Last Day of Therapy:  09/14/23 Start taking on: July 26, 2023   cyanocobalamin  1000 MCG tablet Commonly known as: VITAMIN B12 Take 1,000 mcg by mouth daily.   ferric citrate  1 GM 210 MG(Fe) tablet Commonly known as: AURYXIA  Take 630 mg by mouth 3 (three) times daily with meals.   hydrALAZINE  25 MG tablet Commonly known as: APRESOLINE  Take 1 tablet (25 mg total) by mouth 3 (three) times daily. Take 50 mg 3 times daily on M, W, F(dialysis days) Take 50 mg twice daily on Tu, Th, Sat, Sun(non-dialysis days) What changed:  how much to take when to take this additional instructions   isosorbide  mononitrate 30 MG 24 hr tablet Commonly known as: IMDUR  Take 1 tablet (30 mg total) by mouth 4 (four) times a week. Mon, Wed, Fri, Sun (non-dialysis days)   methocarbamol  500 MG tablet Commonly known as: ROBAXIN  Take 1 tablet (500 mg total) by mouth every 8 (eight) hours as needed for muscle  spasms.   metoprolol  succinate 50 MG 24 hr tablet Commonly known as: TOPROL -XL Take 50 mg by mouth daily.   nitroGLYCERIN  0.4 MG SL tablet Commonly known as: NITROSTAT  Place 0.4 mg under the tongue every 5 (five) minutes as needed for chest pain.   NovoLOG  70/30 FlexPen (70-30) 100 UNIT/ML FlexPen Generic drug: insulin  aspart protamine  - aspart Inject 20 Units into the skin at bedtime.   oxyCODONE -acetaminophen  5-325 MG tablet Commonly known as: PERCOCET/ROXICET Take 1 tablet by mouth every 6 (six) hours as needed for severe pain (pain score 7-10) or moderate pain (pain score 4-6). What changed:  when to take this reasons to take this   pantoprazole  40 MG tablet Commonly known as: PROTONIX  Take 1 tablet (40 mg total) by mouth daily. Start taking on: July 26, 2023   rosuvastatin  40 MG tablet Commonly known as: CRESTOR  Take 40 mg by mouth daily.               Home Infusion Instuctions  (From admission, onward)           Start     Ordered   07/25/23 0000  Home infusion instructions       Question:  Instructions  Answer:  Flushing of vascular access device: 0.9% NaCl pre/post medication administration and prn patency; Heparin  100 u/ml, 5ml for implanted ports and Heparin  10u/ml, 5ml for all other central venous catheters.   07/25/23 1212              Follow-up Information     Bernie Lamar PARAS, MD. Schedule an appointment as soon as possible for a visit in 1 week(s).   Specialty: Cardiology Contact information: [address] [place] [address] [phone]                No Known Allergies  Consultations: ID/nephrology/vascular surgery/cardiology/GI    Procedures/Studies: ECHO TEE Result Date: 07/25/2023    TRANSESOPHOGEAL ECHO REPORT   Patient Name:   Justin [name] Date of Exam: 07/25/2023 Medical Rec #:  [id number]     Height:       68.0 in Accession #:    [id number]    Weight:       171.7 lb Date of Birth:  [birth year]      BSA:           1.916 m Patient Age:    73 years      BP:           185/69 mmHg Patient Gender: M             HR:           46 bpm. Exam Location:  Inpatient Procedure: Transesophageal Echo, Cardiac Doppler and Color Doppler (Both            Spectral and Color Flow Doppler were utilized during procedure). Indications:     Endocarditis  History:         Patient has prior history of Echocardiogram examinations, most                  recent 07/11/2023. CHF, CAD, Aortic Valve Disease; Risk                  Factors:Hypertension and Dyslipidemia.  Sonographer:     Philomena Daring Referring Phys:  [id number] UJBONM A PARCELLS Diagnosing Phys: Oneil Parchment MD PROCEDURE: After discussion of the risks and benefits of a TEE, an informed consent was obtained from the patient. The transesophogeal probe was passed without difficulty through the esophogus of the patient. Imaged were obtained with the patient in a left lateral decubitus position. Sedation performed by different physician. The patient was monitored while under deep sedation. Anesthestetic sedation was provided intravenously by Anesthesiology: 188mg  of Propofol . The patient's vital signs; including heart rate, blood pressure, and oxygen  saturation; remained stable throughout the procedure. The patient developed no complications during the procedure.  IMPRESSIONS  1. Left ventricular ejection fraction, by estimation, is 55 to 60%. The left ventricle has normal function. The left ventricle has no regional wall motion abnormalities.  2. Right ventricular systolic function is normal. The right ventricular size is normal.  3. No left atrial/left atrial appendage thrombus was detected.  4. A small pericardial effusion is present. The pericardial effusion is posterior to the left ventricle.  5. The mitral valve is normal in structure. Mild mitral valve regurgitation. No evidence of mitral stenosis.  6. The aortic valve is tricuspid. There is moderate calcification of the aortic valve. There  is moderate thickening of the aortic valve. Aortic valve regurgitation is mild. Moderate aortic valve stenosis. Aortic valve mean gradient measures 17.5 mmHg. Aortic valve Vmax measures 2.60 m/s.  7. There is mild (Grade  II) plaque.  8. The inferior vena cava is normal in size with greater than 50% respiratory variability, suggesting right atrial pressure of 3 mmHg. Conclusion(s)/Recommendation(s): No evidence of vegetation/infective endocarditis on this transesophageael echocardiogram. FINDINGS  Left Ventricle: Left ventricular ejection fraction, by estimation, is 55 to 60%. The left ventricle has normal function. The left ventricle has no regional wall motion abnormalities. The left ventricular internal cavity size was normal in size. There is  no left ventricular hypertrophy. Right Ventricle: The right ventricular size is normal. No increase in right ventricular wall thickness. Right ventricular systolic function is normal. Left Atrium: Left atrial size was normal in size. No left atrial/left atrial appendage thrombus was detected. Right Atrium: Right atrial size was normal in size. Pericardium: A small pericardial effusion is present. The pericardial effusion is posterior to the left ventricle. Mitral Valve: The mitral valve is normal in structure. Mild mitral valve regurgitation. No evidence of mitral valve stenosis. Tricuspid Valve: The tricuspid valve is normal in structure. Tricuspid valve regurgitation is mild . No evidence of tricuspid stenosis. Aortic Valve: The aortic valve is tricuspid. There is moderate calcification of the aortic valve. There is moderate thickening of the aortic valve. Aortic valve regurgitation is mild. Moderate aortic stenosis is present. Aortic valve mean gradient measures 17.5 mmHg. Aortic valve peak gradient measures 26.9 mmHg. Pulmonic Valve: The pulmonic valve was normal in structure. Pulmonic valve regurgitation is not visualized. No evidence of pulmonic stenosis. Aorta: The  aortic root is normal in size and structure. There is mild (Grade II) plaque. Venous: The inferior vena cava is normal in size with greater than 50% respiratory variability, suggesting right atrial pressure of 3 mmHg. IAS/Shunts: No atrial level shunt detected by color flow Doppler. Additional Comments: Spectral Doppler performed. LEFT VENTRICLE PLAX 2D LVOT diam:     2.30 cm LVOT Area:     4.15 cm  AORTIC VALVE AV Vmax:      259.50 cm/s AV Vmean:     201.000 cm/s AV VTI:       0.682 m AV Peak Grad: 26.9 mmHg AV Mean Grad: 17.5 mmHg  SHUNTS Systemic Diam: 2.30 cm Oneil Parchment MD Electronically signed by Oneil Parchment MD Signature Date/Time: 07/25/2023/9:51:04 AM    Final (Updated)    EP STUDY Result Date: 07/25/2023 See surgical note for result.  IR Fluoro Guide Ndl Plmt / BX Result Date: 07/20/2023 INDICATION: Left-sided epidural fluid collection at the L3-4 level level impressing upon the thecal sac, compatible with epidural abscess/phlegmon. There is associated enhancement of several left nerve roots.  Large left multilocular paraspinous abscess. EXAM: LEFT LUMBAR PARASPINAL ASPIRATION UNDER FLUOROSCOPY MEDICATIONS: Lidocaine  1% subcutaneous ANESTHESIA/SEDATION: Intravenous Fentanyl  25mcg and Versed  1mg  were administered by RN during a total moderate (conscious) sedation time of 10 minutes; the patient's level of consciousness and physiological / cardiorespiratory status were monitored continuously by radiology RN under my direct supervision. PROCEDURE: Informed written consent was obtained from the patient after a thorough discussion of the procedural risks, benefits and alternatives. All questions were addressed. Maximal Sterile Barrier Technique was utilized including caps, mask, sterile gowns, sterile gloves, sterile drape, hand hygiene and skin antiseptic. A timeout was performed prior to the initiation of the procedure. Patient placed right lateral decubitus. The appropriate interspace was identified  under fluoroscopy, corresponding to previous cross-sectional imaging. An appropriate skin entry site was determined. After local infiltration with 1% lidocaine , a 18 gauge percutaneous entry needle was advanced into the left paraspinal soft tissues posterior to  the left L3-4 facet. Needle tip position within the interspace confirmed on biplane images. 1.1 mL purulent fluid were aspirated, sent for the requested laboratory studies. The patient tolerated the procedure well. FLUOROSCOPY TIME:  Radiation Exposure Index (as provided by the fluoroscopic device): 7.3 mGy air Kerma COMPLICATIONS: None immediate. IMPRESSION: Technically successful left lumbar paraspinal abscess aspiration under fluoroscopy. Electronically Signed   By: JONETTA Faes M.D.   On: 07/20/2023 17:07   US  LT UPPER EXTREM LTD SOFT TISSUE NON VASCULAR Result Date: 07/20/2023 CLINICAL DATA:  Infection at AV fistula site. EXAM: ULTRASOUND left UPPER EXTREMITY LIMITED TECHNIQUE: Ultrasound examination of the upper extremity soft tissues was performed in the area of clinical concern. COMPARISON:  None Available. FINDINGS: AV fistula is visualized. No soft tissue edema or organized fluid collection. IMPRESSION: No soft tissue edema or organized fluid collection to suggest abscess. Electronically Signed   By: Newell Eke M.D.   On: 07/20/2023 09:15   MR Lumbar Spine W Wo Contrast Addendum Date: 07/19/2023 ADDENDUM REPORT: 07/19/2023 18:25 ADDENDUM: These results were called by telephone at the time of interpretation on 07/19/2023 at 6:24 pm to provider Dr. Marsha Ada, who verbally acknowledged these results. Electronically Signed   By: Evalene Coho M.D.   On: 07/19/2023 18:25   Result Date: 07/19/2023 CLINICAL DATA:  Myelopathy, acute, lumbar spine concern for epidural abscess EXAM: MRI LUMBAR SPINE WITHOUT AND WITH CONTRAST TECHNIQUE: Multiplanar and multiecho pulse sequences of the lumbar spine were obtained without and with intravenous  contrast. CONTRAST:  7mL GADAVIST  GADOBUTROL  1 MMOL/ML IV SOLN COMPARISON:  None Available. FINDINGS: Segmentation:  Standard. Alignment:  Physiologic. Vertebrae: Vertebral bodies are diffusely heterogeneous in signal intensity. There are discogenic reactive changes also present within the endplates at L2-3 and L3-4. Conus medullaris and cauda equina: Conus extends to the T12-L1 level. The conus appears normal. There is central crowding of the nerve roots at L3-4 secondary to an epidural fluid collection. Paraspinal and other soft tissues: There is a left posterolateral epidural fluid collection at the L3-4 level, measuring approximately 12 x 4 x 23 mm. It impresses upon the thecal sac and displaces the nerve roots of the cauda equina anteriorly and laterally to the right. There is enhancement of numerous nerve roots on the left. There is also a multilocular peripherally enhancing fluid collection in the left paraspinous musculature, measuring approximately 3.0 x 1.8 x 6.7 cm. There is increased T2 signal within the left facet joint, but no convincing evidence of osteomyelitis. Disc levels: There is mild disc space narrowing and mild disc bulging at L2-3 and L4-5, without significant spinal canal stenosis at either level. IMPRESSION: 1. There is a left-sided epidural fluid collection at the L3-4 level level impressing upon the thecal sac, compatible with epidural abscess/phlegmon. There is associated enhancement of several left nerve roots. 2. Large left multilocular paraspinous abscess. Electronically Signed: By: Evalene Coho M.D. On: 07/19/2023 18:10   DG Chest Port 1 View Result Date: 07/19/2023 CLINICAL DATA:  Questionable sepsis - evaluate for abnormality EXAM: PORTABLE CHEST - 1 VIEW COMPARISON:  None available. FINDINGS: No focal airspace consolidation, pleural effusion, or pneumothorax. Moderate cardiomegaly. Aortic atherosclerosis. No acute fracture or destructive lesions. Multilevel thoracic  osteophytosis. IMPRESSION: Cardiomegaly.  No pneumonia or pulmonary edema. Electronically Signed   By: Rogelia Myers M.D.   On: 07/19/2023 14:01   CT RENAL STONE STUDY Result Date: 07/17/2023 CLINICAL DATA:  Flank pain. EXAM: CT ABDOMEN AND PELVIS WITHOUT CONTRAST TECHNIQUE: Multidetector CT imaging  of the abdomen and pelvis was performed following the standard protocol without IV contrast. RADIATION DOSE REDUCTION: This exam was performed according to the departmental dose-optimization program which includes automated exposure control, adjustment of the mA and/or kV according to patient size and/or use of iterative reconstruction technique. COMPARISON:  August 02, 2022. FINDINGS: Lower chest: Small pericardial effusion is noted. Hepatobiliary: Minimal cholelithiasis. No biliary dilatation is noted. Liver is unremarkable on these unenhanced images. Pancreas: Grossly stable 14 mm cystic lesion seen in pancreatic tail. No acute inflammation is noted. Spleen: Normal in size without focal abnormality. Adrenals/Urinary Tract: Adrenal glands appear normal. Bilateral renal atrophy is noted. No hydronephrosis or renal obstruction is noted. Urinary bladder is unremarkable. Stomach/Bowel: Stomach is unremarkable. The appendix appears normal. There is no evidence of bowel obstruction. Mild wall thickening with minimal surrounding inflammatory changes is seen involving the cecum. Vascular/Lymphatic: Aortic atherosclerosis. No enlarged abdominal or pelvic lymph nodes. Reproductive: Prostate is unremarkable. Other: No ascites or hernia is noted. Musculoskeletal: No acute or significant osseous findings. IMPRESSION: Small pericardial effusion. Minimal cholelithiasis. Grossly stable 14 mm cystic lesion seen in pancreatic tail. Recommend follow up pre and post contrast MRI/MRCP or pancreatic protocol CT in 2 years. This recommendation follows ACR consensus guidelines: Management of Incidental Pancreatic Cysts: A White Paper of  the ACR Incidental Findings Committee. J Am Coll Radiol 2017;14:911-923. Mild wall thickening with minimal surrounding inflammatory changes is seen involving the cecum suggesting infectious or inflammatory colitis. Bilateral renal atrophy is noted. Aortic Atherosclerosis (ICD10-I70.0). Electronically Signed   By: Lynwood Landy Raddle M.D.   On: 07/17/2023 12:27   PERIPHERAL VASCULAR CATHETERIZATION Result Date: 07/16/2023 Images from the original result were not included. Patient name: Justin [name] MRN: [id number] DOB: [birth year] Sex: male 07/16/2023 Pre-operative Diagnosis: Bleeding from left arm fistula Post-operative diagnosis:  Same Surgeon:  Malvina New Procedure Performed:  1.  Ultrasound-guided access, left cephalic vein  2.  Fistulogram  3.  Peripheral balloon venoplasty  4.  Conscious sedation, 12 minutes Indications: This is a 73 year old gentleman who was brought into the hospital for hemorrhagic shock likely secondary to bleeding from his dialysis cannulation sites.  He comes in today for further evaluation Procedure:  The patient was identified in the holding area and taken to room 8.  The patient was then placed supine on the table and prepped and draped in the usual sterile fashion.  A time out was called.  Conscious sedation was administered with the use of IV fentanyl  and Versed  under continuous physician and nurse monitoring.  Heart rate, blood pressure, and oxygen  saturations were continuously monitored.  Total sedation time was 12 minutes ultrasound was used to evaluate the fistula.  The vein was patent and compressible.  A digital ultrasound image was acquired.  The fistula was then accessed under ultrasound guidance using a micropuncture needle.  An 018 wire was then asvanced without resistance and a micropuncture sheath was placed.  Contrast injections were then performed through the sheath. Findings: No evidence of central venous stenosis.  The arteriovenous anastomosis is widely patent.   Numerous overlapping stents are visualized in the distal fistula going through the cephalic vein arch.  At the proximal and distal stent edges, there was approximately a 50% stenosis.  This was not clinically visualized on the proximal stent however it was very difficult to get a wire to pass.  Intervention: After the above images were acquired the decision was made to proceed with intervention.  A 6 French sheath was  placed over a Bentson wire.  I had some difficulty getting the Bentson wire through the proximal portion of the stents but was ultimately able to do so.  I then selected a 9 x 40 balloon and performed balloon venoplasty of the cephalic vein at the proximal and distal stent edges.  Completion imaging revealed no residual stenosis.  There is a small size mismatch between the stents and the vein.  Balloon and wire were removed.  A 4-0 Monocryl was placed around the sheath which was removed.  There were no immediate complications Impression:  #1  Successful balloon venoplasty of the proximal and distal stent edge stenosis using a 9 mm balloon  #2  Fistula remains amenable to future interventions V. Malvina New, M.D., FACS [facility] Office: [phone] Pager:  [phone]   ECHOCARDIOGRAM COMPLETE Result Date: 07/12/2023    ECHOCARDIOGRAM REPORT   Patient Name:   Justin [name] Date of Exam: 07/11/2023 Medical Rec #:  [id number]     Height:       68.0 in Accession #:    [id number]    Weight:       173.8 lb Date of Birth:  1950/04/22      BSA:          1.925 m Patient Age:    73 years      BP:           140/58 mmHg Patient Gender: M             HR:           62 bpm. Exam Location:  [facility] Procedure: 2D Echo, Cardiac Doppler, Color Doppler and Strain Analysis (Both            Spectral and Color Flow Doppler were utilized during procedure). Indications:    Dyspnea on exertion [R06.09 (ICD-10-CM)]  History:        Patient has prior history of Echocardiogram examinations,  most                 recent 08/30/2022. CHF, CAD, Aortic Valve Disease; Risk                 Factors:Diabetes, Dyslipidemia and Hypertension. End-stage                 kidney disease on dialysis.  Sonographer:    Charlie Jointer RDCS Referring Phys: [id number] [name] [name] IMPRESSIONS  1. Left ventricular ejection fraction, by estimation, is 60 to 65%. The left ventricle has normal function. The left ventricle has no regional wall motion abnormalities. There is severe left ventricular hypertrophy. Left ventricular diastolic parameters  are consistent with Grade II diastolic dysfunction (pseudonormalization).  2. Right ventricular systolic function is normal. The right ventricular size is mildly enlarged.  3. Left atrial size was severely dilated.  4. A small pericardial effusion is present. The pericardial effusion is circumferential. There is no evidence of cardiac tamponade.  5. The mitral valve is degenerative. Mild mitral valve regurgitation. No evidence of mitral stenosis.  6. The aortic valve is tricuspid. There is moderate calcification of the aortic valve. Aortic valve regurgitation is mild. Moderate to severe aortic valve stenosis. Aortic valve area, by VTI measures 0.79 cm. Aortic valve mean gradient measures 23.8 mmHg. Aortic valve Vmax measures 3.21 m/s.  7. The inferior vena cava is normal in size with greater than 50% respiratory variability, suggesting right atrial pressure of 3 mmHg. Comparison(s): In comparison prior TTE 8/8/82024  LVEF 60-65%, grade 1DD, trace MR, AS-paradoxical low gradient severe, small-moderate pericardial effusion. FINDINGS  Left Ventricle: Left ventricular ejection fraction, by estimation, is 60 to 65%. The left ventricle has normal function. The left ventricle has no regional wall motion abnormalities. Global longitudinal strain performed but not reported based on interpreter judgement due to suboptimal tracking. The left ventricular internal cavity size was normal in size.  There is severe left ventricular hypertrophy. Left ventricular diastolic parameters are consistent with Grade II diastolic dysfunction (pseudonormalization). Right Ventricle: The right ventricular size is mildly enlarged. Right vetricular wall thickness was not well visualized. Right ventricular systolic function is normal. Left Atrium: Left atrial size was severely dilated. Right Atrium: Right atrial size was normal in size. Pericardium: A small pericardial effusion is present. The pericardial effusion is circumferential. There is no evidence of cardiac tamponade. Mitral Valve: The mitral valve is degenerative in appearance. Mild mitral annular calcification. Mild mitral valve regurgitation. No evidence of mitral valve stenosis. Tricuspid Valve: The tricuspid valve is not well visualized. Tricuspid valve regurgitation is mild . No evidence of tricuspid stenosis. Aortic Valve: The aortic valve is tricuspid. There is moderate calcification of the aortic valve. Aortic valve regurgitation is mild. Moderate to severe aortic stenosis is present. Aortic valve mean gradient measures 23.8 mmHg. Aortic valve peak gradient  measures 41.3 mmHg. Aortic valve area, by VTI measures 0.79 cm. Pulmonic Valve: The pulmonic valve was grossly normal. Pulmonic valve regurgitation is trivial. No evidence of pulmonic stenosis. Aorta: The aortic root and ascending aorta are structurally normal, with no evidence of dilitation. Venous: The inferior vena cava is normal in size with greater than 50% respiratory variability, suggesting right atrial pressure of 3 mmHg. IAS/Shunts: The interatrial septum was not well visualized.  LEFT VENTRICLE PLAX 2D LVIDd:         4.70 cm   Diastology LVIDs:         3.10 cm   LV e' medial:    4.68 cm/s LV PW:         1.60 cm   LV E/e' medial:  15.8 LV IVS:        1.70 cm   LV e' lateral:   6.60 cm/s LVOT diam:     2.10 cm   LV E/e' lateral: 11.2 LV SV:         63 LV SV Index:   33 LVOT Area:     3.46 cm   RIGHT VENTRICLE             IVC RV Basal diam:  4.10 cm     IVC diam: 2.00 cm RV Mid diam:    3.30 cm RV S prime:     12.43 cm/s TAPSE (M-mode): 2.3 cm LEFT ATRIUM            Index        RIGHT ATRIUM           Index LA diam:      4.50 cm  2.34 cm/m   RA Area:     14.80 cm LA Vol (A2C): 106.0 ml 55.06 ml/m  RA Volume:   37.50 ml  19.48 ml/m LA Vol (A4C): 102.0 ml 52.98 ml/m  AORTIC VALVE AV Area (Vmax):    0.86 cm AV Area (Vmean):   0.77 cm AV Area (VTI):     0.79 cm AV Vmax:           321.40 cm/s AV Vmean:  229.200 cm/s AV VTI:            0.792 m AV Peak Grad:      41.3 mmHg AV Mean Grad:      23.8 mmHg LVOT Vmax:         79.93 cm/s LVOT Vmean:        50.800 cm/s LVOT VTI:          0.182 m LVOT/AV VTI ratio: 0.23  AORTA Ao Root diam: 3.70 cm Ao Asc diam:  3.60 cm Ao Desc diam: 2.10 cm MITRAL VALVE MV Area (PHT): 3.78 cm    SHUNTS MV Decel Time: 201 msec    Systemic VTI:  0.18 m MR Peak grad: 48.2 mmHg    Systemic Diam: 2.10 cm MR Vmax:      347.00 cm/s MV E velocity: 73.90 cm/s MV A velocity: 73.55 cm/s MV E/A ratio:  1.00 Sreedhar reddy Madireddy Electronically signed by Alean reddy Madireddy Signature Date/Time: 07/12/2023/8:26:22 PM    Final       Subjective: Patient seen and examined at bedside. Poor historian. No fever, seizures, agitation reported.   Discharge Exam: Vitals:   07/25/23 0853 07/25/23 0945  BP: (!) 183/63 (!) 162/66  Pulse: (!) 57 (!) 57  Resp: 14 18  Temp:  97.7 F (36.5 C)  SpO2: 99% 99%    General exam: Appears calm and comfortable.  Looks chronically ill and deconditioned Respiratory system: Bilateral decreased breath sounds at bases with scattered crackles Cardiovascular system: S1 & S2 heard, but in the bradycardic Gastrointestinal system: Abdomen is nondistended, soft and nontender. Normal bowel sounds heard. Extremities: No cyanosis, clubbing; trace lower extremity edema present Central nervous system:.  Slightly slow to respond.  Poor  historian.  No focal neurological deficits. Moving extremities Skin: No rashes, lesions or ulcers Psychiatry: Flat affect.  Not agitated.    The results of significant diagnostics from this hospitalization (including imaging, microbiology, ancillary and laboratory) are listed below for reference.     Microbiology: Recent Results (from the past 240 hours)  Blood Culture (routine x 2)     Status: Abnormal   Collection Time: 07/19/23 11:34 AM   Specimen: BLOOD  Result Value Ref Range Status   Specimen Description BLOOD RIGHT ANTECUBITAL  Final   Special Requests   Final    BOTTLES DRAWN AEROBIC AND ANAEROBIC Blood Culture adequate volume   Culture  Setup Time   Final    GRAM POSITIVE COCCI IN CLUSTERS AEROBIC BOTTLE ONLY CRITICAL RESULT CALLED TO, READ BACK BY AND VERIFIED WITH: PHARMD C.  AMAND [phone] [id number] FCP Performed at [facility], [address], [place], [place] [address]    Culture STAPHYLOCOCCUS AUREUS (A)  Final   Report Status 07/22/2023 FINAL  Final   Organism ID, Bacteria STAPHYLOCOCCUS AUREUS  Final      Susceptibility   Staphylococcus aureus - MIC*    CIPROFLOXACIN <=0.5 SENSITIVE Sensitive     ERYTHROMYCIN <=0.25 SENSITIVE Sensitive     GENTAMICIN <=0.5 SENSITIVE Sensitive     OXACILLIN <=0.25 SENSITIVE Sensitive     TETRACYCLINE <=1 SENSITIVE Sensitive     VANCOMYCIN  1 SENSITIVE Sensitive     TRIMETH/SULFA <=10 SENSITIVE Sensitive     CLINDAMYCIN <=0.25 SENSITIVE Sensitive     RIFAMPIN <=0.5 SENSITIVE Sensitive     Inducible Clindamycin NEGATIVE Sensitive     LINEZOLID 2 SENSITIVE Sensitive     * STAPHYLOCOCCUS AUREUS  Aerobic/Anaerobic Culture w Gram Stain (surgical/deep wound)  Status: None   Collection Time: 07/20/23  4:36 PM   Specimen: Abscess  Result Value Ref Range Status   Specimen Description ABSCESS  Final   Special Requests LUMBAR PARA SPINAL  Final   Gram Stain   Final    ABUNDANT WBC PRESENT, PREDOMINANTLY PMN FEW GRAM POSITIVE  COCCI IN CLUSTERS    Culture   Final    ABUNDANT STAPHYLOCOCCUS AUREUS NO ANAEROBES ISOLATED Performed at [facility], [address], [place], [place] [address]    Report Status 07/25/2023 FINAL  Final   Organism ID, Bacteria STAPHYLOCOCCUS AUREUS  Final      Susceptibility   Staphylococcus aureus - MIC*    CIPROFLOXACIN <=0.5 SENSITIVE Sensitive     ERYTHROMYCIN <=0.25 SENSITIVE Sensitive     GENTAMICIN <=0.5 SENSITIVE Sensitive     OXACILLIN <=0.25 SENSITIVE Sensitive     TETRACYCLINE <=1 SENSITIVE Sensitive     VANCOMYCIN  1 SENSITIVE Sensitive     TRIMETH/SULFA <=10 SENSITIVE Sensitive     CLINDAMYCIN <=0.25 SENSITIVE Sensitive     RIFAMPIN <=0.5 SENSITIVE Sensitive     Inducible Clindamycin NEGATIVE Sensitive     LINEZOLID 2 SENSITIVE Sensitive     * ABUNDANT STAPHYLOCOCCUS AUREUS  Culture, blood (Routine X 2) w Reflex to ID Panel     Status: None   Collection Time: 07/20/23  5:41 PM   Specimen: BLOOD RIGHT ARM  Result Value Ref Range Status   Specimen Description BLOOD RIGHT ARM  Final   Special Requests   Final    BOTTLES DRAWN AEROBIC AND ANAEROBIC Blood Culture results may not be optimal due to an inadequate volume of blood received in culture bottles   Culture   Final    NO GROWTH 5 DAYS Performed at [facility], [address] [place], [place] [address]    Report Status 07/25/2023 FINAL  Final  Culture, blood (Routine X 2) w Reflex to ID Panel     Status: None   Collection Time: 07/20/23  5:42 PM   Specimen: BLOOD RIGHT HAND  Result Value Ref Range Status   Specimen Description BLOOD RIGHT HAND  Final   Special Requests   Final    BOTTLES DRAWN AEROBIC ONLY Blood Culture results may not be optimal due to an inadequate volume of blood received in culture bottles   Culture   Final    NO GROWTH 5 DAYS Performed at [facility], [address], [place], [place] [address]    Report Status 07/25/2023 FINAL  Final  Culture, blood (Routine X 2) w  Reflex to ID Panel     Status: None (Preliminary result)   Collection Time: 07/23/23  5:39 AM   Specimen: BLOOD  Result Value Ref Range Status   Specimen Description BLOOD BLOOD RIGHT ARM  Final   Special Requests   Final    BOTTLES DRAWN AEROBIC AND ANAEROBIC Blood Culture results may not be optimal due to an inadequate volume of blood received in culture bottles   Culture   Final    NO GROWTH 2 DAYS Performed at [facility], [address] [place], [place] [address]    Report Status PENDING  Incomplete  Culture, blood (Routine X 2) w Reflex to ID Panel     Status: None (Preliminary result)   Collection Time: 07/23/23  5:43 AM   Specimen: BLOOD  Result Value Ref Range Status   Specimen Description BLOOD BLOOD RIGHT ARM  Final   Special Requests   Final    BOTTLES DRAWN AEROBIC AND ANAEROBIC Blood Culture adequate volume   Culture   Final    NO GROWTH 2 DAYS Performed at [facility], [address], [place], [place] [address]    Report Status PENDING  Incomplete     Labs: BNP (last 3 results) No results for input(s): BNP in the last 8760 hours. Basic Metabolic Panel: Recent Labs  Lab 07/21/23 1307 07/22/23 1022 07/23/23 0539 07/24/23 0502 07/25/23 0509  NA 126* 132* 132* 128* 133*  K 4.0 4.2 4.5 4.5 4.4  CL 88* 94* 92* 89* 94*  CO2 20* 25 22 21* 27  GLUCOSE 99 154* 87 110* 122*  BUN 51* 27* 36* 43* 26*  CREATININE 11.08* 7.23* 8.18* 9.54* 6.40*  CALCIUM  7.3* 7.6* 7.5* 7.5* 7.8*  PHOS 9.1* 5.9* 6.5* 6.5* 4.5   Liver Function Tests: Recent Labs  Lab 07/19/23 1134 07/21/23 1307 07/22/23 1022 07/23/23 0539 07/24/23 0502 07/25/23 0509  AST 49*  --   --   --   --   --   ALT 81*  --   --   --   --   --   ALKPHOS 88  --   --   --   --   --   BILITOT 0.7  --   --   --   --   --   PROT 6.6  --   --   --   --   --   ALBUMIN  2.7* 2.3* 2.1* 2.3* 2.4* 2.1*   No results for input(s): LIPASE, AMYLASE in the last 168 hours. No results for input(s):  AMMONIA in the last 168 hours. CBC: Recent Labs  Lab 07/19/23 1134 07/20/23 0409 07/21/23 1307 07/22/23 1022 07/23/23 0539 07/24/23 0502 07/25/23 0509  WBC 12.4*   < > 9.6 8.7 8.2 9.6 8.5  NEUTROABS 10.3*  --   --   --   --   --   --   HGB 9.0*   < > 8.1* 7.9* 8.3* 8.4* 7.9*  HCT 26.8*   < > 23.9* 23.7* 25.2* 25.3* 24.2*  MCV 88.2   < > 85.1 86.5 87.5 87.2 89.0  PLT 298   < > 321 354 384 422* 396   < > = values in this interval not displayed.   Cardiac Enzymes: No results for input(s): CKTOTAL, CKMB, CKMBINDEX, TROPONINI in the last 168 hours. BNP: Invalid input(s): POCBNP CBG: Recent Labs  Lab 07/24/23 1227 07/24/23 1625 07/24/23 2025 07/25/23 0942 07/25/23 1140  GLUCAP 112* 141* 138* 80 97   D-Dimer No results for input(s): DDIMER in the last 72 hours. Hgb A1c No results for input(s): HGBA1C in the last 72 hours. Lipid Profile No results for input(s): CHOL, HDL, LDLCALC, TRIG, CHOLHDL, LDLDIRECT in the last 72 hours. Thyroid function studies No results for input(s): TSH, T4TOTAL, T3FREE, THYROIDAB in the last 72 hours.  Invalid input(s): FREET3 Anemia work up No results for input(s): VITAMINB12, FOLATE, FERRITIN, TIBC, IRON , RETICCTPCT in the last 72 hours. Urinalysis    Component Value Date/Time   COLORURINE YELLOW 01/06/2019 1600   APPEARANCEUR CLEAR 01/06/2019 1600   LABSPEC 1.014 01/06/2019 1600   PHURINE 5.0 01/06/2019 1600   GLUCOSEU 150 (A) 01/06/2019 1600   HGBUR SMALL (A) 01/06/2019 1600   BILIRUBINUR NEGATIVE 01/06/2019 1600   KETONESUR NEGATIVE 01/06/2019 1600   PROTEINUR 100 (A) 01/06/2019 1600   NITRITE NEGATIVE 01/06/2019 1600  LEUKOCYTESUR NEGATIVE 01/06/2019 1600   Sepsis Labs Recent Labs  Lab 07/22/23 1022 07/23/23 0539 07/24/23 0502 07/25/23 0509  WBC 8.7 8.2 9.6 8.5   Microbiology Recent Results (from the past 240 hours)  Blood Culture (routine x 2)     Status: Abnormal    Collection Time: 07/19/23 11:34 AM   Specimen: BLOOD  Result Value Ref Range Status   Specimen Description BLOOD RIGHT ANTECUBITAL  Final   Special Requests   Final    BOTTLES DRAWN AEROBIC AND ANAEROBIC Blood Culture adequate volume   Culture  Setup Time   Final    GRAM POSITIVE COCCI IN CLUSTERS AEROBIC BOTTLE ONLY CRITICAL RESULT CALLED TO, READ BACK BY AND VERIFIED WITH: PHARMD C.  AMAND [phone] [id number] FCP Performed at [facility], [address], [place], [place] [address]    Culture STAPHYLOCOCCUS AUREUS (A)  Final   Report Status 07/22/2023 FINAL  Final   Organism ID, Bacteria STAPHYLOCOCCUS AUREUS  Final      Susceptibility   Staphylococcus aureus - MIC*    CIPROFLOXACIN <=0.5 SENSITIVE Sensitive     ERYTHROMYCIN <=0.25 SENSITIVE Sensitive     GENTAMICIN <=0.5 SENSITIVE Sensitive     OXACILLIN <=0.25 SENSITIVE Sensitive     TETRACYCLINE <=1 SENSITIVE Sensitive     VANCOMYCIN  1 SENSITIVE Sensitive     TRIMETH/SULFA <=10 SENSITIVE Sensitive     CLINDAMYCIN <=0.25 SENSITIVE Sensitive     RIFAMPIN <=0.5 SENSITIVE Sensitive     Inducible Clindamycin NEGATIVE Sensitive     LINEZOLID 2 SENSITIVE Sensitive     * STAPHYLOCOCCUS AUREUS  Aerobic/Anaerobic Culture w Gram Stain (surgical/deep wound)     Status: None   Collection Time: 07/20/23  4:36 PM   Specimen: Abscess  Result Value Ref Range Status   Specimen Description ABSCESS  Final   Special Requests LUMBAR PARA SPINAL  Final   Gram Stain   Final    ABUNDANT WBC PRESENT, PREDOMINANTLY PMN FEW GRAM POSITIVE COCCI IN CLUSTERS    Culture   Final    ABUNDANT STAPHYLOCOCCUS AUREUS NO ANAEROBES ISOLATED Performed at [facility], [address] [place], [place] [address]    Report Status 07/25/2023 FINAL  Final   Organism ID, Bacteria STAPHYLOCOCCUS AUREUS  Final      Susceptibility   Staphylococcus aureus - MIC*    CIPROFLOXACIN <=0.5 SENSITIVE Sensitive     ERYTHROMYCIN <=0.25 SENSITIVE Sensitive      GENTAMICIN <=0.5 SENSITIVE Sensitive     OXACILLIN <=0.25 SENSITIVE Sensitive     TETRACYCLINE <=1 SENSITIVE Sensitive     VANCOMYCIN  1 SENSITIVE Sensitive     TRIMETH/SULFA <=10 SENSITIVE Sensitive     CLINDAMYCIN <=0.25 SENSITIVE Sensitive     RIFAMPIN <=0.5 SENSITIVE Sensitive     Inducible Clindamycin NEGATIVE Sensitive     LINEZOLID 2 SENSITIVE Sensitive     * ABUNDANT STAPHYLOCOCCUS AUREUS  Culture, blood (Routine X 2) w Reflex to ID Panel     Status: None   Collection Time: 07/20/23  5:41 PM   Specimen: BLOOD RIGHT ARM  Result Value Ref Range Status   Specimen Description BLOOD RIGHT ARM  Final   Special Requests   Final    BOTTLES DRAWN AEROBIC AND ANAEROBIC Blood Culture results may not be optimal due to an inadequate volume of blood received in culture bottles   Culture   Final    NO GROWTH 5 DAYS Performed at [facility], [address], [place], [place] [address]    Report Status 07/25/2023 FINAL  Final  Culture, blood (Routine X 2) w Reflex to ID Panel     Status: None   Collection Time: 07/20/23  5:42 PM   Specimen: BLOOD RIGHT HAND  Result Value Ref Range Status   Specimen Description BLOOD RIGHT HAND  Final   Special Requests   Final    BOTTLES DRAWN AEROBIC ONLY Blood Culture results may not be optimal due to an inadequate volume of blood received in culture bottles   Culture   Final    NO GROWTH 5 DAYS Performed at [facility], [address], [place], [place] [address]    Report Status 07/25/2023 FINAL  Final  Culture, blood (Routine X 2) w Reflex to ID Panel     Status: None (Preliminary result)   Collection Time: 07/23/23  5:39 AM   Specimen: BLOOD  Result Value Ref Range Status   Specimen Description BLOOD BLOOD RIGHT ARM  Final   Special Requests   Final    BOTTLES DRAWN AEROBIC AND ANAEROBIC Blood Culture results may not be optimal due to an inadequate volume of blood received in culture bottles   Culture   Final    NO GROWTH 2  DAYS Performed at [facility], [address] [place], [place] [address]    Report Status PENDING  Incomplete  Culture, blood (Routine X 2) w Reflex to ID Panel     Status: None (Preliminary result)   Collection Time: 07/23/23  5:43 AM   Specimen: BLOOD  Result Value Ref Range Status   Specimen Description BLOOD BLOOD RIGHT ARM  Final   Special Requests   Final    BOTTLES DRAWN AEROBIC AND ANAEROBIC Blood Culture adequate volume   Culture   Final    NO GROWTH 2 DAYS Performed at [facility], [address] [place], [place] [address]    Report Status PENDING  Incomplete     Time coordinating discharge: 35 minutes  SIGNED:   Sophie Mao, MD  [facility] 07/25/2023, 4:18 PM

## 2023-07-25 NOTE — Transfer of Care (Signed)
 Immediate Anesthesia Transfer of Care Note  Patient: Justin [name]  Procedure(s) Performed: TRANSESOPHAGEAL ECHOCARDIOGRAM  Patient Location: PACU and Cath Lab  Anesthesia Type:MAC  Level of Consciousness: sedated  Airway & Oxygen  Therapy: Patient Spontanous Breathing  Post-op Assessment: Report given to RN and Post -op Vital signs reviewed and stable  Post vital signs: Reviewed and stable  Last Vitals:  Vitals Value Taken Time  BP 140/61 07/25/23 08:33  Temp 36.5 C 07/25/23 08:33  Pulse 60 07/25/23 08:34  Resp 19 07/25/23 08:34  SpO2 96 % 07/25/23 08:34  Vitals shown include unfiled device data.  Last Pain:  Vitals:   07/25/23 0833  TempSrc: Tympanic  PainSc: Asleep      Patients Stated Pain Goal: 0 (07/24/23 1830)  Complications: No notable events documented.

## 2023-07-25 NOTE — Anesthesia Procedure Notes (Signed)
 Procedure Name: MAC Date/Time: 07/25/2023 8:06 AM  Performed by: Evette Ade, CRNAPre-anesthesia Checklist: Patient identified, Emergency Drugs available, Suction available, Patient being monitored and Timeout performed Patient Re-evaluated:Patient Re-evaluated prior to induction Oxygen  Delivery Method: Simple face mask Preoxygenation: Pre-oxygenation with 100% oxygen  Induction Type: IV induction

## 2023-07-25 NOTE — Discharge Planning (Signed)
 [facility] Patient Discharge Orders- [facility] CLINIC: [facility]  Patient's name: REMBERTO [name] Admit/DC Dates: 07/19/2023 - 07/25/2023  Discharge Diagnoses: MSSA bacteremia with associated LS epidural phlegmon and paraspinal abscess. S/p EGD on 07/23/2023 which showed LA grade a esophagitis with no bleeding.  S/p TEE 7/23 negative for vegetation. See below on ABXs    Aranesp : Given: Yes    Date and amount of last dose: 200mcg given on 07/22/23   Last Hgb: 7.9 PRBC's Given: No  ESA dose for discharge: mircera 225 mcg IV q 2 weeks  IV Iron  dose at discharge: N/A  Heparin  change: No  EDW Change: No   Bath Change: No  Access intervention/Change: Yes Details: AVF bleeding d/t removing scab. S/p fistulogram and angioplasty on 07/16/2023. VVS placed an extra stitch. Advised not to scratch AVF, keep bandage in place between treatment if needed to help prevent scratching. Important to hold manual compression for 10-42min post treatment.   Hectorol change: No  Discharge Labs: Calcium  7.8  Phosphorus 4.5  Albumin  2.1  K+ 4.4  IV Antibiotics: Yes Details: MSSA bacteremia with associated LS epidural phlegmon and paraspinal abscess. Patient will be on Cefazolin  2GM IV X 8 weeks until 09/14/23.   On Coumadin?: No   OTHER/APPTS/LAB ORDERS:    D/C Meds to be reconciled by nurse after every discharge.  Completed By: Charmaine Piety, NP   Reviewed by: MD:______ RN_______

## 2023-07-25 NOTE — Progress Notes (Signed)
 [facility]  Date of Admission:  07/19/2023   Total days of inpatient antibiotics 6  Principal Problem:   Bacteremia due to methicillin susceptible Staphylococcus aureus (MSSA) Active Problems:   Esophagitis          Assessment: 73 year old male with past medical history of ESRD on HD via left AVF complicated by recent admission for hemorrhagic shock secondary to bleeding fistula during HD underwent fistulogram and angioplasty with IR on 7/14 called back as blood cultures from last admission grew MSSA. #MSSA bacteremia with metastasis to lumbar spine likely secondary to left AVF #ESRD on HD #CAD #DM - 7/11 1/4 bottles MSSA positive on day 5, blood cultures on 7/17 grew MSSA -Pt afebrile wbc 12k on admssion -- MRI lumbar spine showed epidural fluid collection at L3-L4 impressing upon the thecal sac consistent with epidural abscess/phlegmon.  Left multilocular paraspinous abscess.  Neurosurgery was consulted, recommended IR consult for aspiration of paraspinal abscess. - Upper extremity ultrasound showed no edema or organized fluid collection to suggest abscess. - Status post aspiration of lumbar paraspinal process, yielding 1 mL purulent culture growing MSSA - Repeat blood cultures on 7/18 no growth - TEE no vegetation  Recommendations: -Cefazolin  with HD x 8 weeks negative blood cultures EOT 9/12 - Follow-up with ID towards end of treatment - Communicated plan to primary - ID will sign off   Microbiology:   Antibiotics:  Cultures: Blood 7/11 1/2 MSSA 7/17 1/1 aerobic bottles only MSSA 7/18 lumbar paraspinal MSSA 7/18 no growth 7/21 no growth Urine  Other   SUBJECTIVE: Resting in bed.  Interval: Afebrile overnight.   Review of Systems: Review of Systems  All other systems reviewed and are negative.    Scheduled Meds:  (feeding supplement) PROSource Plus  30 mL Oral BID BM   acetaminophen   1,000 mg Oral TID   amLODipine    10 mg Oral Daily   aspirin  EC  81 mg Oral Daily   Chlorhexidine  Gluconate Cloth  6 each Topical Q0600   darbepoetin (ARANESP ) injection - DIALYSIS  200 mcg Subcutaneous Q Sun-1800   ferric citrate   630 mg Oral TID WC   heparin  injection (subcutaneous)  5,000 Units Subcutaneous Q8H   insulin  aspart  0-15 Units Subcutaneous TID WC   insulin  glargine-yfgn  20 Units Subcutaneous QHS   isosorbide  mononitrate  30 mg Oral Once per day on Sunday Tuesday Thursday Saturday   methocarbamol   500 mg Oral TID   metoprolol  succinate  50 mg Oral Daily   ondansetron  (ZOFRAN ) IV  4 mg Intravenous Once   pantoprazole   40 mg Oral Daily   rosuvastatin   40 mg Oral Daily   Continuous Infusions:   ceFAZolin  (ANCEF ) IV 1 g (07/24/23 1708)   PRN Meds:.HYDROmorphone  (DILAUDID ) injection, oxyCODONE  No Known Allergies  OBJECTIVE: Vitals:   07/25/23 0833 07/25/23 0843 07/25/23 0853 07/25/23 0945  BP: (!) 140/61 (!) 154/61 (!) 183/63 (!) 162/66  Pulse: (!) 56 (!) 55 (!) 57 (!) 57  Resp: 20 17 14 18   Temp: 97.7 F (36.5 C)   97.7 F (36.5 C)  TempSrc: Tympanic   Oral  SpO2: 95% 96% 99% 99%  Weight:      Height:       Body mass index is 26.11 kg/m.  Physical Exam Constitutional:      General: He is not in acute distress.    Appearance: He is normal weight. He is not toxic-appearing.  HENT:  Head: Normocephalic and atraumatic.     Right Ear: External ear normal.     Left Ear: External ear normal.     Nose: No congestion or rhinorrhea.     Mouth/Throat:     Mouth: Mucous membranes are moist.     Pharynx: Oropharynx is clear.  Eyes:     Extraocular Movements: Extraocular movements intact.     Conjunctiva/sclera: Conjunctivae normal.     Pupils: Pupils are equal, round, and reactive to light.  Cardiovascular:     Rate and Rhythm: Normal rate and regular rhythm.     Heart sounds: No murmur heard.    No friction rub. No gallop.  Pulmonary:     Effort: Pulmonary effort is normal.     Breath  sounds: Normal breath sounds.  Abdominal:     General: Abdomen is flat. Bowel sounds are normal.     Palpations: Abdomen is soft.  Musculoskeletal:        General: No swelling. Normal range of motion.     Cervical back: Normal range of motion and neck supple.  Skin:    General: Skin is warm and dry.  Neurological:     General: No focal deficit present.     Mental Status: He is oriented to person, place, and time.  Psychiatric:        Mood and Affect: Mood normal.       Lab Results Lab Results  Component Value Date   WBC 8.5 07/25/2023   HGB 7.9 (L) 07/25/2023   HCT 24.2 (L) 07/25/2023   MCV 89.0 07/25/2023   PLT 396 07/25/2023    Lab Results  Component Value Date   CREATININE 6.40 (H) 07/25/2023   BUN 26 (H) 07/25/2023   NA 133 (L) 07/25/2023   K 4.4 07/25/2023   CL 94 (L) 07/25/2023   CO2 27 07/25/2023    Lab Results  Component Value Date   ALT 81 (H) 07/19/2023   AST 49 (H) 07/19/2023   ALKPHOS 88 07/19/2023   BILITOT 0.7 07/19/2023        [name], MD [facility] 07/25/2023, 2:28 PM  Evaluation of this patient requires complex antimicrobial therapy evaluation and counseling + isolation needs for disease transmission risk assessment and mitigation

## 2023-07-26 DIAGNOSIS — N2581 Secondary hyperparathyroidism of renal origin: Secondary | ICD-10-CM | POA: Diagnosis not present

## 2023-07-26 DIAGNOSIS — G061 Intraspinal abscess and granuloma: Secondary | ICD-10-CM | POA: Diagnosis not present

## 2023-07-26 DIAGNOSIS — Z992 Dependence on renal dialysis: Secondary | ICD-10-CM | POA: Diagnosis not present

## 2023-07-26 DIAGNOSIS — R7881 Bacteremia: Secondary | ICD-10-CM | POA: Diagnosis not present

## 2023-07-26 DIAGNOSIS — N186 End stage renal disease: Secondary | ICD-10-CM | POA: Diagnosis not present

## 2023-07-27 DIAGNOSIS — N186 End stage renal disease: Secondary | ICD-10-CM | POA: Diagnosis not present

## 2023-07-27 DIAGNOSIS — E1129 Type 2 diabetes mellitus with other diabetic kidney complication: Secondary | ICD-10-CM | POA: Diagnosis not present

## 2023-07-27 DIAGNOSIS — I10 Essential (primary) hypertension: Secondary | ICD-10-CM | POA: Diagnosis not present

## 2023-07-27 DIAGNOSIS — B9562 Methicillin resistant Staphylococcus aureus infection as the cause of diseases classified elsewhere: Secondary | ICD-10-CM | POA: Diagnosis not present

## 2023-07-27 DIAGNOSIS — K2100 Gastro-esophageal reflux disease with esophagitis, without bleeding: Secondary | ICD-10-CM

## 2023-07-27 DIAGNOSIS — R7881 Bacteremia: Secondary | ICD-10-CM | POA: Diagnosis not present

## 2023-07-27 DIAGNOSIS — G062 Extradural and subdural abscess, unspecified: Secondary | ICD-10-CM | POA: Diagnosis not present

## 2023-07-27 DIAGNOSIS — D72829 Elevated white blood cell count, unspecified: Secondary | ICD-10-CM | POA: Diagnosis not present

## 2023-07-27 DIAGNOSIS — I120 Hypertensive chronic kidney disease with stage 5 chronic kidney disease or end stage renal disease: Secondary | ICD-10-CM | POA: Diagnosis not present

## 2023-07-27 DIAGNOSIS — D631 Anemia in chronic kidney disease: Secondary | ICD-10-CM | POA: Diagnosis not present

## 2023-07-27 HISTORY — DX: Gastro-esophageal reflux disease with esophagitis, without bleeding: K21.00

## 2023-07-28 DIAGNOSIS — N2581 Secondary hyperparathyroidism of renal origin: Secondary | ICD-10-CM | POA: Diagnosis not present

## 2023-07-28 DIAGNOSIS — Z992 Dependence on renal dialysis: Secondary | ICD-10-CM | POA: Diagnosis not present

## 2023-07-28 DIAGNOSIS — N186 End stage renal disease: Secondary | ICD-10-CM | POA: Diagnosis not present

## 2023-07-30 LAB — CULTURE, BLOOD (ROUTINE X 2)
Culture: NO GROWTH
Culture: NO GROWTH
Special Requests: ADEQUATE

## 2023-07-31 DIAGNOSIS — N2581 Secondary hyperparathyroidism of renal origin: Secondary | ICD-10-CM | POA: Diagnosis not present

## 2023-07-31 DIAGNOSIS — N186 End stage renal disease: Secondary | ICD-10-CM | POA: Diagnosis not present

## 2023-07-31 DIAGNOSIS — Z992 Dependence on renal dialysis: Secondary | ICD-10-CM | POA: Diagnosis not present

## 2023-08-02 DIAGNOSIS — N2581 Secondary hyperparathyroidism of renal origin: Secondary | ICD-10-CM | POA: Diagnosis not present

## 2023-08-02 DIAGNOSIS — N186 End stage renal disease: Secondary | ICD-10-CM | POA: Diagnosis not present

## 2023-08-02 DIAGNOSIS — Z992 Dependence on renal dialysis: Secondary | ICD-10-CM | POA: Diagnosis not present

## 2023-08-02 DIAGNOSIS — E1129 Type 2 diabetes mellitus with other diabetic kidney complication: Secondary | ICD-10-CM | POA: Diagnosis not present

## 2023-08-04 DIAGNOSIS — N2581 Secondary hyperparathyroidism of renal origin: Secondary | ICD-10-CM | POA: Diagnosis not present

## 2023-08-04 DIAGNOSIS — Z992 Dependence on renal dialysis: Secondary | ICD-10-CM | POA: Diagnosis not present

## 2023-08-04 DIAGNOSIS — N186 End stage renal disease: Secondary | ICD-10-CM | POA: Diagnosis not present

## 2023-08-07 DIAGNOSIS — N186 End stage renal disease: Secondary | ICD-10-CM | POA: Diagnosis not present

## 2023-08-07 DIAGNOSIS — N2581 Secondary hyperparathyroidism of renal origin: Secondary | ICD-10-CM | POA: Diagnosis not present

## 2023-08-07 DIAGNOSIS — Z992 Dependence on renal dialysis: Secondary | ICD-10-CM | POA: Diagnosis not present

## 2023-08-09 DIAGNOSIS — N2581 Secondary hyperparathyroidism of renal origin: Secondary | ICD-10-CM | POA: Diagnosis not present

## 2023-08-09 DIAGNOSIS — Z992 Dependence on renal dialysis: Secondary | ICD-10-CM | POA: Diagnosis not present

## 2023-08-09 DIAGNOSIS — N186 End stage renal disease: Secondary | ICD-10-CM | POA: Diagnosis not present

## 2023-08-11 DIAGNOSIS — N186 End stage renal disease: Secondary | ICD-10-CM | POA: Diagnosis not present

## 2023-08-11 DIAGNOSIS — N2581 Secondary hyperparathyroidism of renal origin: Secondary | ICD-10-CM | POA: Diagnosis not present

## 2023-08-11 DIAGNOSIS — Z992 Dependence on renal dialysis: Secondary | ICD-10-CM | POA: Diagnosis not present

## 2023-08-14 DIAGNOSIS — N186 End stage renal disease: Secondary | ICD-10-CM | POA: Diagnosis not present

## 2023-08-14 DIAGNOSIS — N2581 Secondary hyperparathyroidism of renal origin: Secondary | ICD-10-CM | POA: Diagnosis not present

## 2023-08-14 DIAGNOSIS — Z992 Dependence on renal dialysis: Secondary | ICD-10-CM | POA: Diagnosis not present

## 2023-08-15 ENCOUNTER — Telehealth: Payer: Self-pay

## 2023-08-15 NOTE — Telephone Encounter (Signed)
 Pt's girlfriend Nathanel Courts called with concerns about pt still having prolonged bleeding after HD treatments. This has never continued past his treatment; controlled by the time he leaves the center. She states it has not improved since the fistulogram. I have left a VM for the charge nurse at [facility] to get further clarification, after spending over ten minutes on hold for the charge nurse.

## 2023-08-16 ENCOUNTER — Telehealth: Payer: Self-pay

## 2023-08-16 DIAGNOSIS — Z992 Dependence on renal dialysis: Secondary | ICD-10-CM | POA: Diagnosis not present

## 2023-08-16 DIAGNOSIS — N2581 Secondary hyperparathyroidism of renal origin: Secondary | ICD-10-CM | POA: Diagnosis not present

## 2023-08-16 DIAGNOSIS — N186 End stage renal disease: Secondary | ICD-10-CM | POA: Diagnosis not present

## 2023-08-16 NOTE — Telephone Encounter (Signed)
 Second attempt made to reach pt's HD nurse regarding Justin [name] concern about bleeding from access site at HD. Spoke to [place] who said this has occurred before and after his fistulogram. She said it starts prior to treatment when they remove his bandaid. I have asked her if the nephrologist has seen it and she said he isn't there today but she will ask him to assess it and then let us  know if there is any appt needed with us . I have let Justin [name] know this.

## 2023-08-21 ENCOUNTER — Telehealth: Payer: Self-pay | Admitting: Internal Medicine

## 2023-08-21 DIAGNOSIS — N2581 Secondary hyperparathyroidism of renal origin: Secondary | ICD-10-CM | POA: Diagnosis not present

## 2023-08-21 DIAGNOSIS — N186 End stage renal disease: Secondary | ICD-10-CM | POA: Diagnosis not present

## 2023-08-21 DIAGNOSIS — Z992 Dependence on renal dialysis: Secondary | ICD-10-CM | POA: Diagnosis not present

## 2023-08-21 NOTE — Telephone Encounter (Signed)
 Created as a error.  No documentation or encounter needed.

## 2023-08-21 NOTE — Telephone Encounter (Signed)
 Created by error.  No encounter needed.

## 2023-08-22 ENCOUNTER — Ambulatory Visit: Payer: Self-pay | Admitting: Internal Medicine

## 2023-08-22 ENCOUNTER — Other Ambulatory Visit: Payer: Self-pay

## 2023-08-22 ENCOUNTER — Telehealth: Payer: Self-pay

## 2023-08-22 VITALS — BP 140/67 | HR 61 | Temp 98.1°F

## 2023-08-22 DIAGNOSIS — B9561 Methicillin susceptible Staphylococcus aureus infection as the cause of diseases classified elsewhere: Secondary | ICD-10-CM | POA: Diagnosis not present

## 2023-08-22 DIAGNOSIS — R7881 Bacteremia: Secondary | ICD-10-CM

## 2023-08-22 NOTE — Progress Notes (Signed)
 Patient: Justin [name]  DOB: April 09, 1950 MRN: [id number] PCP: Bernie Lamar PARAS, MD    Patient Active Problem List   Diagnosis Date Noted   Gastroesophageal reflux disease with esophagitis without hemorrhage 07/27/2023   Bacteremia due to methicillin susceptible Staphylococcus aureus (MSSA) 07/19/2023   Hemorrhagic shock (HCC) 07/13/2023   Bilateral carotid artery stenosis 06/06/2023   Erosive esophagitis 06/06/2023   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) 06/06/2023   Vitamin B12 deficiency 06/06/2023   Dependence on renal dialysis (HCC) 02/26/2023   Fall 02/26/2023   Gastroesophageal reflux disease without esophagitis 02/26/2023   Fever, unspecified 02/26/2023   Headache, unspecified 02/26/2023   Pure hypercholesterolemia 02/26/2023   Other fluid overload 02/26/2023   Esophagitis 02/25/2023   Insomnia due to medical condition 02/25/2023   Hiccups 02/24/2023   Acute postoperative anemia due to expected blood loss 02/23/2023   Globus sensation 02/23/2023   Delirium due to multiple etiologies 02/23/2023   S/P ORIF (open reduction internal fixation) fracture - right intertrochanteric fracture 02/17/2023   Closed comminuted intertrochanteric fracture of proximal end of femur with nonunion, right 02/16/2023   Type 2 diabetes mellitus with hyperglycemia (HCC) 02/16/2023   BPH (benign prostatic hyperplasia) 10/18/2020   Dysuria 10/18/2020   Pinna disorder, left 10/18/2020   Renal disorder 10/18/2020   Wears glasses 10/18/2020   Coronary artery disease involving native coronary artery of native heart without angina pectoris 11/19/2019   ED (erectile dysfunction) 11/19/2019   Essential hypertension 11/19/2019   ESRD on hemodialysis (HCC) - out-pt HD at [facility] [place] on TTS 5:15 am chair time 11/19/2019   Other disorders of phosphorus metabolism 06/28/2018   Aortic valve disorder 05/07/2018   Mild protein-calorie malnutrition (HCC) 05/02/2018   Anemia in  chronic kidney disease 05/01/2018   Coagulation defect, unspecified (HCC) 05/01/2018   Iron  deficiency anemia, unspecified 05/01/2018   Pruritus, unspecified 05/01/2018   Pain, unspecified 05/01/2018   Shortness of breath 05/01/2018   Secondary hyperparathyroidism of renal origin (HCC) 05/01/2018   Nonrheumatic aortic valve stenosis    Chronic systolic CHF (congestive heart failure) (HCC) - now with recovered LVEF 04/24/2018   Hypertensive heart disease with congestive heart failure and stage 5 kidney disease (HCC) 04/24/2018   Type 2 diabetes mellitus with chronic kidney disease on chronic dialysis, with long-term current use of insulin  (HCC) 04/24/2018   Hyperlipidemia 04/24/2018   Long term (current) use of insulin  (HCC) 04/24/2018   Lower urinary tract symptoms due to benign prostatic hyperplasia 05/20/2014     Subjective:  Justin [name] is a 73 year old male with past medical as below including history of ESRD on HD via left AV fistula complicated by recent hospitalization for hemorrhagic shock secondary to bleeding fistula during HD underwent fistulogram and angioplasty with IR on 7/14 was called back as blood cultures from hospitalization on 7/11 grew MSSA.  Presents for hospital follow-up of MSSA bacteremia with mets to lumbar spine likely secondary to left AV fistula.  On 7/11 1 out of 4 bottles grew MSSA on day 5, blood cultures from 7/17 grew MSSA 1 out of 2 bottles, 1 out of 1 set.  MRI lumbar spine showed epidural fluid collection L3-L4, paraspinous abscess.  Neurosurgery was consulted recommended IR consult for aspiration.  Patient underwent aspiration with IR yielding 1 mL of purulent culture growing MSSA.  TTE and TEE without vegetation.  Patient discharged on cefazolin  with HD x 8 weeks EOT 9/12.   Today 08/22/2023: no new  complaints, other than fistula site with some blood at times. States nephrology aware. Pt states it was noted during HD session.  Review of Systems  All other  systems reviewed and are negative.   Past Medical History:  Diagnosis Date   BPH (benign prostatic hyperplasia)    Diabetes (HCC)    Dysuria    Erectile dysfunction    ESRD on hemodialysis (HCC)    History of chronic CHF 11/19/2019   Hyperlipemia    Hypertension    Personal history of COVID-19 11/19/2019   Pinna disorder, left    Wears glasses     Outpatient Medications Prior to Visit  Medication Sig Dispense Refill   acetaminophen  (TYLENOL ) 500 MG tablet Take 2 tablets (1,000 mg total) by mouth every 6 (six) hours as needed for mild pain (pain score 1-3), fever or headache.     amLODipine  (NORVASC ) 10 MG tablet Take 1 tablet (10 mg total) by mouth daily. 30 tablet 0   aspirin  EC 81 MG tablet Take 81 mg by mouth daily. Swallow whole.     ceFAZolin  (ANCEF ) IVPB Inject 2 g into the vein Every Tuesday,Thursday,and Saturday with dialysis. Indication:  MSSA bacteremia/back infection  First Dose: Yes Last Day of Therapy:  09/14/23     cyanocobalamin  (VITAMIN B12) 1000 MCG tablet Take 1,000 mcg by mouth daily.     ferric citrate  (AURYXIA ) 1 GM 210 MG(Fe) tablet Take 630 mg by mouth 3 (three) times daily with meals.     hydrALAZINE  (APRESOLINE ) 25 MG tablet Take 1 tablet (25 mg total) by mouth 3 (three) times daily. Take 50 mg 3 times daily on M, W, F(dialysis days) Take 50 mg twice daily on Tu, Th, Sat, Sun(non-dialysis days) (Patient taking differently: Take 50-150 mg by mouth See admin instructions. Take two tablets (50 mg) 2 times daily on M, W, Friday Sun(non dialysis days) Take 150 mg twice daily on Tu, Th, Sat,(dialysis days))     insulin  aspart protamine  - aspart (NOVOLOG  70/30 FLEXPEN) (70-30) 100 UNIT/ML FlexPen Inject 20 Units into the skin at bedtime.     isosorbide  mononitrate (IMDUR ) 30 MG 24 hr tablet Take 1 tablet (30 mg total) by mouth 4 (four) times a week. Mon, Wed, Fri, Sun (non-dialysis days) 64 tablet 2   methocarbamol  (ROBAXIN ) 500 MG tablet Take 1 tablet (500 mg total)  by mouth every 8 (eight) hours as needed for muscle spasms. 15 tablet 0   metoprolol  succinate (TOPROL -XL) 50 MG 24 hr tablet Take 50 mg by mouth daily.     nitroGLYCERIN  (NITROSTAT ) 0.4 MG SL tablet Place 0.4 mg under the tongue every 5 (five) minutes as needed for chest pain.     oxyCODONE -acetaminophen  (PERCOCET/ROXICET) 5-325 MG tablet Take 1 tablet by mouth every 6 (six) hours as needed for severe pain (pain score 7-10) or moderate pain (pain score 4-6). 20 tablet 0   pantoprazole  (PROTONIX ) 40 MG tablet Take 1 tablet (40 mg total) by mouth daily. 30 tablet 0   rosuvastatin  (CRESTOR ) 40 MG tablet Take 40 mg by mouth daily.     No facility-administered medications prior to visit.     No Known Allergies  Social History   Tobacco Use   Smoking status: Never   Smokeless tobacco: Never  Vaping Use   Vaping status: Never Used  Substance Use Topics   Alcohol  use: Not Currently   Drug use: Never    Family History  Problem Relation Age of Onset   Stroke Mother  Diabetes Father     Objective:  There were no vitals filed for this visit. There is no height or weight on file to calculate BMI.  Physical Exam Constitutional:      General: He is not in acute distress.    Appearance: He is normal weight. He is not toxic-appearing.  HENT:     Head: Normocephalic and atraumatic.     Right Ear: External ear normal.     Left Ear: External ear normal.     Nose: No congestion or rhinorrhea.     Mouth/Throat:     Mouth: Mucous membranes are moist.     Pharynx: Oropharynx is clear.  Eyes:     Extraocular Movements: Extraocular movements intact.     Conjunctiva/sclera: Conjunctivae normal.     Pupils: Pupils are equal, round, and reactive to light.  Cardiovascular:     Rate and Rhythm: Normal rate and regular rhythm.     Heart sounds: No murmur heard.    No friction rub. No gallop.  Pulmonary:     Effort: Pulmonary effort is normal.     Breath sounds: Normal breath sounds.   Abdominal:     General: Abdomen is flat. Bowel sounds are normal.     Palpations: Abdomen is soft.  Musculoskeletal:        General: No swelling. Normal range of motion.     Cervical back: Normal range of motion and neck supple.  Skin:    General: Skin is warm and dry.  Neurological:     General: No focal deficit present.     Mental Status: He is oriented to person, place, and time.  Psychiatric:        Mood and Affect: Mood normal.     Lab Results: Lab Results  Component Value Date   WBC 8.5 07/25/2023   HGB 7.9 (L) 07/25/2023   HCT 24.2 (L) 07/25/2023   MCV 89.0 07/25/2023   PLT 396 07/25/2023    Lab Results  Component Value Date   CREATININE 6.40 (H) 07/25/2023   BUN 26 (H) 07/25/2023   NA 133 (L) 07/25/2023   K 4.4 07/25/2023   CL 94 (L) 07/25/2023   CO2 27 07/25/2023    Lab Results  Component Value Date   ALT 81 (H) 07/19/2023   AST 49 (H) 07/19/2023   ALKPHOS 88 07/19/2023   BILITOT 0.7 07/19/2023     Assessment & Plan:   #MSSA bacteremia with metastasis to lumbar spine likely secondary to left AVF #ESRD on HD #CAD #DM -Patient has initial hospitalization on 7/11 due to hemorrhagic shock secondary to bleeding fistula during HD underwent fistulogram angioplasty with IR on 7/14 was called out back as blood cultures on day 5 grew 1 out of 4 bottles MSSA. - 7/17 blood cultures grew 1 out of 1 set, in aerobic bottles only MSSA, repeat blood culture 7/8 18 no growth - CT showed epidural fluid around the L3-L4 spine concerning for abscess/phlegmon, paraspinous abscesses as well.  IR aspirated lumbar paraspinal fluid which grew MSSA as well. - TTE and TEE without vegetation. - Patient discharged on cefazolin  x 8 weeks with HD EOT 9/12. Ok to stop at 9/12. -Pt states fistula site still bleed, pt states nephrology aware. Back pain improved. Communicated with Dr [facility], stent in place, no graft.  -follow up in one month  #Medication management -Labs:  6.1k on 08/16/23  with HD, lft , esr and crp today  [name], MD [facility]   08/22/23  12:51 PM I have personally spent 45 minutes involved in face-to-face and non-face-to-face activities for this patient on the day of the visit. Professional time spent includes the following activities: Preparing to see the patient (review of tests), Obtaining and/or reviewing separately obtained history (admission/discharge record), Performing a medically appropriate examination and/or evaluation , Ordering medications/tests/procedures, referring and communicating with other health care professionals, Documenting clinical information in the EMR, Independently interpreting results (not separately reported), Communicating results to the patient/family/caregiver, Counseling and educating the patient/family/caregiver and Care coordination (not separately reported).

## 2023-08-22 NOTE — Telephone Encounter (Signed)
 Per Dr. Dennise pt Cefazolin  can end on 9/12. Relayed orders to [employer] with [facility]. Lorenda CHRISTELLA Code, RMA

## 2023-08-23 DIAGNOSIS — N186 End stage renal disease: Secondary | ICD-10-CM | POA: Diagnosis not present

## 2023-08-23 DIAGNOSIS — Z992 Dependence on renal dialysis: Secondary | ICD-10-CM | POA: Diagnosis not present

## 2023-08-23 DIAGNOSIS — N2581 Secondary hyperparathyroidism of renal origin: Secondary | ICD-10-CM | POA: Diagnosis not present

## 2023-08-23 LAB — HEPATIC FUNCTION PANEL
AG Ratio: 1.4 (calc) (ref 1.0–2.5)
ALT: 4 U/L — ABNORMAL LOW (ref 9–46)
AST: 45 U/L — ABNORMAL HIGH (ref 10–35)
Albumin: 3.7 g/dL (ref 3.6–5.1)
Alkaline phosphatase (APISO): 77 U/L (ref 35–144)
Bilirubin, Direct: 0.2 mg/dL (ref 0.0–0.2)
Globulin: 2.6 g/dL (ref 1.9–3.7)
Indirect Bilirubin: 0.4 mg/dL (ref 0.2–1.2)
Total Bilirubin: 0.6 mg/dL (ref 0.2–1.2)
Total Protein: 6.3 g/dL (ref 6.1–8.1)

## 2023-08-23 LAB — SEDIMENTATION RATE: Sed Rate: 39 mm/h — ABNORMAL HIGH (ref 0–20)

## 2023-08-23 LAB — C-REACTIVE PROTEIN: CRP: 160 mg/L — ABNORMAL HIGH (ref <8.0)

## 2023-08-25 DIAGNOSIS — Z992 Dependence on renal dialysis: Secondary | ICD-10-CM | POA: Diagnosis not present

## 2023-08-25 DIAGNOSIS — N186 End stage renal disease: Secondary | ICD-10-CM | POA: Diagnosis not present

## 2023-08-25 DIAGNOSIS — N2581 Secondary hyperparathyroidism of renal origin: Secondary | ICD-10-CM | POA: Diagnosis not present

## 2023-08-28 DIAGNOSIS — N2581 Secondary hyperparathyroidism of renal origin: Secondary | ICD-10-CM | POA: Diagnosis not present

## 2023-08-28 DIAGNOSIS — N186 End stage renal disease: Secondary | ICD-10-CM | POA: Diagnosis not present

## 2023-08-28 DIAGNOSIS — Z992 Dependence on renal dialysis: Secondary | ICD-10-CM | POA: Diagnosis not present

## 2023-08-29 DIAGNOSIS — R7881 Bacteremia: Secondary | ICD-10-CM | POA: Diagnosis not present

## 2023-08-29 DIAGNOSIS — B9562 Methicillin resistant Staphylococcus aureus infection as the cause of diseases classified elsewhere: Secondary | ICD-10-CM | POA: Diagnosis not present

## 2023-08-29 DIAGNOSIS — R197 Diarrhea, unspecified: Secondary | ICD-10-CM | POA: Diagnosis not present

## 2023-08-29 DIAGNOSIS — G062 Extradural and subdural abscess, unspecified: Secondary | ICD-10-CM | POA: Diagnosis not present

## 2023-08-29 DIAGNOSIS — K2210 Ulcer of esophagus without bleeding: Secondary | ICD-10-CM | POA: Diagnosis not present

## 2023-08-29 DIAGNOSIS — N186 End stage renal disease: Secondary | ICD-10-CM | POA: Diagnosis not present

## 2023-08-29 DIAGNOSIS — R112 Nausea with vomiting, unspecified: Secondary | ICD-10-CM | POA: Diagnosis not present

## 2023-08-30 DIAGNOSIS — Z992 Dependence on renal dialysis: Secondary | ICD-10-CM | POA: Diagnosis not present

## 2023-08-30 DIAGNOSIS — N186 End stage renal disease: Secondary | ICD-10-CM | POA: Diagnosis not present

## 2023-08-30 DIAGNOSIS — N2581 Secondary hyperparathyroidism of renal origin: Secondary | ICD-10-CM | POA: Diagnosis not present

## 2023-09-01 DIAGNOSIS — Z992 Dependence on renal dialysis: Secondary | ICD-10-CM | POA: Diagnosis not present

## 2023-09-01 DIAGNOSIS — N186 End stage renal disease: Secondary | ICD-10-CM | POA: Diagnosis not present

## 2023-09-01 DIAGNOSIS — N2581 Secondary hyperparathyroidism of renal origin: Secondary | ICD-10-CM | POA: Diagnosis not present

## 2023-09-02 DIAGNOSIS — Z992 Dependence on renal dialysis: Secondary | ICD-10-CM | POA: Diagnosis not present

## 2023-09-02 DIAGNOSIS — E1129 Type 2 diabetes mellitus with other diabetic kidney complication: Secondary | ICD-10-CM | POA: Diagnosis not present

## 2023-09-02 DIAGNOSIS — N186 End stage renal disease: Secondary | ICD-10-CM | POA: Diagnosis not present

## 2023-09-04 DIAGNOSIS — N2581 Secondary hyperparathyroidism of renal origin: Secondary | ICD-10-CM | POA: Diagnosis not present

## 2023-09-04 DIAGNOSIS — N186 End stage renal disease: Secondary | ICD-10-CM | POA: Diagnosis not present

## 2023-09-04 DIAGNOSIS — Z992 Dependence on renal dialysis: Secondary | ICD-10-CM | POA: Diagnosis not present

## 2023-09-06 DIAGNOSIS — Z992 Dependence on renal dialysis: Secondary | ICD-10-CM | POA: Diagnosis not present

## 2023-09-06 DIAGNOSIS — N186 End stage renal disease: Secondary | ICD-10-CM | POA: Diagnosis not present

## 2023-09-06 DIAGNOSIS — N2581 Secondary hyperparathyroidism of renal origin: Secondary | ICD-10-CM | POA: Diagnosis not present

## 2023-09-07 DIAGNOSIS — I700 Atherosclerosis of aorta: Secondary | ICD-10-CM | POA: Insufficient documentation

## 2023-09-07 DIAGNOSIS — I6523 Occlusion and stenosis of bilateral carotid arteries: Secondary | ICD-10-CM | POA: Insufficient documentation

## 2023-09-07 DIAGNOSIS — D72829 Elevated white blood cell count, unspecified: Secondary | ICD-10-CM | POA: Insufficient documentation

## 2023-09-07 DIAGNOSIS — E8809 Other disorders of plasma-protein metabolism, not elsewhere classified: Secondary | ICD-10-CM | POA: Insufficient documentation

## 2023-09-07 DIAGNOSIS — E119 Type 2 diabetes mellitus without complications: Secondary | ICD-10-CM | POA: Insufficient documentation

## 2023-09-08 DIAGNOSIS — N2581 Secondary hyperparathyroidism of renal origin: Secondary | ICD-10-CM | POA: Diagnosis not present

## 2023-09-08 DIAGNOSIS — N186 End stage renal disease: Secondary | ICD-10-CM | POA: Diagnosis not present

## 2023-09-08 DIAGNOSIS — Z992 Dependence on renal dialysis: Secondary | ICD-10-CM | POA: Diagnosis not present

## 2023-09-10 ENCOUNTER — Encounter: Payer: Self-pay | Admitting: Cardiology

## 2023-09-10 ENCOUNTER — Ambulatory Visit: Attending: Cardiology | Admitting: Cardiology

## 2023-09-10 VITALS — BP 110/54 | HR 66 | Ht 68.0 in | Wt 174.2 lb

## 2023-09-10 DIAGNOSIS — Z992 Dependence on renal dialysis: Secondary | ICD-10-CM

## 2023-09-10 DIAGNOSIS — I359 Nonrheumatic aortic valve disorder, unspecified: Secondary | ICD-10-CM | POA: Diagnosis not present

## 2023-09-10 DIAGNOSIS — I5022 Chronic systolic (congestive) heart failure: Secondary | ICD-10-CM

## 2023-09-10 DIAGNOSIS — Z794 Long term (current) use of insulin: Secondary | ICD-10-CM | POA: Diagnosis not present

## 2023-09-10 DIAGNOSIS — I2510 Atherosclerotic heart disease of native coronary artery without angina pectoris: Secondary | ICD-10-CM

## 2023-09-10 DIAGNOSIS — E1122 Type 2 diabetes mellitus with diabetic chronic kidney disease: Secondary | ICD-10-CM | POA: Diagnosis not present

## 2023-09-10 DIAGNOSIS — N186 End stage renal disease: Secondary | ICD-10-CM

## 2023-09-10 NOTE — Patient Instructions (Signed)
 Medication Instructions:  Your physician recommends that you continue on your current medications as directed. Please refer to the Current Medication list given to you today.  *If you need a refill on your cardiac medications before your next appointment, please call your pharmacy*  Lab Work: None If you have labs (blood work) drawn today and your tests are completely normal, you will receive your results only by: MyChart Message (if you have MyChart) OR A paper copy in the mail If you have any lab test that is abnormal or we need to change your treatment, we will call you to review the results.  Testing/Procedures: None  Follow-Up: At [facility], you and your health needs are our priority.  As part of our continuing mission to provide you with exceptional heart care, our providers are all part of one team.  This team includes your primary Cardiologist (physician) and Advanced Practice Providers or APPs (Physician Assistants and Nurse Practitioners) who all work together to provide you with the care you need, when you need it.  Your next appointment:   3 month(s)  Provider:   Ralene Burger, MD    We recommend signing up for the patient portal called MyChart.  Sign up information is provided on this After Visit Summary.  MyChart is used to connect with patients for Virtual Visits (Telemedicine).  Patients are able to view lab/test results, encounter notes, upcoming appointments, etc.  Non-urgent messages can be sent to your provider as well.   To learn more about what you can do with MyChart, go to [email or web address].   Other Instructions None

## 2023-09-10 NOTE — Progress Notes (Signed)
 Cardiology Office Note:    Date:  09/10/2023   ID:  Justin [name], DOB 1950-04-24, MRN [id number]  PCP:  Bernie Lamar PARAS, MD  Cardiologist:  Lamar Bernie, MD    Referring MD: Nichole Senior, MD   No chief complaint on file.   History of Present Illness:    Justin [name] is a 73 y.o. male   past medical history significant for end-stage kidney disease he is on dialysis he has been on dialysis for about 4 years, coronary artery disease cardiac catheterization done just recently show moderate stenosis of the LAD, also 70% of diagonal branch, 90% of small circumflex, 90% PDA. The reason for cardiac catheterization was also to assess his degree of aortic stenosis which appears to be moderate, what prompted investigation to begin with was the fact that he was potential candidate for kidney transplant.  Since have seen him last time significant development happened.  In July he ended up having hemorrhagic shock due to bleeding fistula from dialysis.  He did have fistulogram and angioplasty done on however in the hospital he was also found to have MRSA positive in his blood.  Additional issue left-sided epidural collection of fluid L3-L4 that required aspiration of the left lumbar paraspinal abscess on 718.  At that time he got EGD done which showed no significant pathology and then had transesophageal echocardiogram done which was likely negative for endocarditis.  Partial assessment of the aortic valve described as moderate.  However the main reason for doing TEE was to rule out endocarditis.  Family thinks that subsided he looks much better he feels much better.  Denies have any pain tightness squeezing pressure burning chest.  He comes here to talk about aortic stenosis which appears to be significant.  Echocardiogram from July of this year showed calculated aortic valve area 0.8, dimension index 0.23, peak gradient 41 mean 23.8, stroke-volume index was 33.  Heart rate we dealing with low-flow low  gradient significant aortic stenosis  Past Medical History:  Diagnosis Date   Acute postoperative anemia due to expected blood loss 02/23/2023   Anemia in chronic kidney disease 05/01/2018   Aortic valve disorder 05/07/2018   Bacteremia due to methicillin susceptible Staphylococcus aureus (MSSA) 07/19/2023   Bilateral carotid artery stenosis 06/06/2023   BPH (benign prostatic hyperplasia)    Chronic systolic CHF (congestive heart failure) (HCC) - now with recovered LVEF 04/24/2018   Closed comminuted intertrochanteric fracture of proximal end of femur with nonunion, right 02/16/2023   Coagulation defect, unspecified (HCC) 05/01/2018   Coronary artery disease involving native coronary artery of native heart without angina pectoris 11/19/2019   LHC 09-29-2022     1.  Normal right heart hemodynamics with preserved cardiac output of 8.96 L/min and cardiac index of 4.5 L/min/m (likely high flow with hemodialysis/AV fistula)  2.  Moderate aortic stenosis with mean gradient 20 mmHg, calculated valve area 2.2 cm likely overestimated with high flow state.  Valve crossed with a J-wire.  3.  Patent left main with no stenosis  4.  Patent LAD with   Delirium due to multiple etiologies 02/23/2023   Dependence on renal dialysis (HCC) 02/26/2023   Diabetes (HCC)    Dysuria    Erectile dysfunction    Erosive esophagitis 06/06/2023   Esophagitis 02/25/2023   ESRD on hemodialysis [facility])    Fall 02/26/2023   Fever, unspecified 02/26/2023   Gastroesophageal reflux disease with esophagitis without hemorrhage 07/27/2023   Gastroesophageal reflux disease without esophagitis 02/26/2023  Globus sensation 02/23/2023   Hardening of the aorta (main artery of the heart) (HCC) 09/07/2023   Headache, unspecified 02/26/2023   Hemorrhagic shock (HCC) 07/13/2023   Hiccups 02/24/2023   History of chronic CHF 11/19/2019   Hyperlipemia    Hypertension    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)  06/06/2023   Hypertensive heart disease with congestive heart failure and stage 5 kidney disease (HCC) 04/24/2018   Hypoalbuminemia 09/07/2023   Insomnia due to medical condition 02/25/2023   Intraspinal abscess and granuloma 07/26/2023   Iron  deficiency anemia, unspecified 05/01/2018   Leukocytosis 09/07/2023   Long term (current) use of insulin  (HCC) 04/24/2018   Lower urinary tract symptoms due to benign prostatic hyperplasia 05/20/2014   Mild protein-calorie malnutrition (HCC) 05/02/2018   Nonrheumatic aortic valve stenosis    Occlusion and stenosis of bilateral carotid arteries 09/07/2023   Other disorders of phosphorus metabolism 06/28/2018   Other fluid overload 02/26/2023   Pain, unspecified 05/01/2018   Personal history of COVID-19 11/19/2019   Pinna disorder, left    Pruritus, unspecified 05/01/2018   Pure hypercholesterolemia 02/26/2023   Renal disorder 10/18/2020   S/P ORIF (open reduction internal fixation) fracture - right intertrochanteric fracture 02/17/2023   Secondary hyperparathyroidism of renal origin (HCC) 05/01/2018   Shortness of breath 05/01/2018   Type 2 diabetes mellitus with chronic kidney disease on chronic dialysis, with long-term current use of insulin  (HCC) 04/24/2018   Type 2 diabetes mellitus with hyperglycemia (HCC) 02/16/2023   Vitamin B12 deficiency 06/06/2023   Wears glasses     Past Surgical History:  Procedure Laterality Date   A/V FISTULAGRAM Left 07/16/2023   Procedure: A/V Fistulagram;  Surgeon: Serene Gaile ORN, MD;  Location: [facility] INVASIVE CV LAB;  Service: Cardiovascular;  Laterality: Left;   A/V SHUNT INTERVENTION N/A 04/16/2023   Procedure: A/V SHUNT INTERVENTION;  Surgeon: Norine Manuelita LABOR, MD;  Location: [facility] CV [facility];  Service: Cardiovascular;  Laterality: N/A;   AV FISTULA PLACEMENT Left 04/30/2018   Procedure: CREATION OF RADIOCEPHALIC ARTERIOVENOUS FISTULA LEFT ARM;  Surgeon: Harvey Carlin BRAVO, MD;  Location: [facility] OR;  Service:  Vascular;  Laterality: Left;   AV FISTULA PLACEMENT Left 08/28/2018   Procedure: ARTERIOVENOUS (AV) BRACHIOCEPHALIC FISTULA CREATION LEFT UPPER ARM;  Surgeon: Gretta Lonni PARAS, MD;  Location[facility] OR;  Service: Vascular;  Laterality: Left;   BIOPSY  02/24/2023   Procedure: BIOPSY;  Surgeon: Federico Rosario BROCKS, MD;  Location: [facility] ENDOSCOPY;  Service: Gastroenterology;;   ESOPHAGOGASTRODUODENOSCOPY N/A 07/23/2023   Procedure: EGD (ESOPHAGOGASTRODUODENOSCOPY);  Surgeon: Nandigam, Kavitha V, MD;  Location: [facility] ENDOSCOPY;  Service: Gastroenterology;  Laterality: N/A;   ESOPHAGOGASTRODUODENOSCOPY (EGD) WITH PROPOFOL  N/A 02/24/2023   Procedure: ESOPHAGOGASTRODUODENOSCOPY (EGD) WITH PROPOFOL ;  Surgeon: Federico Rosario BROCKS, MD;  Location: [facility] ENDOSCOPY;  Service: Gastroenterology;  Laterality: N/A;  Patint is s/p right hip ORIF   INTRAMEDULLARY (IM) NAIL INTERTROCHANTERIC Right 02/17/2023   Procedure: INTRAMEDULLARY (IM) NAIL INTERTROCHANTERIC;  Surgeon: Sherida Adine BROCKS, MD;  Location: [facility] OR;  Service: Orthopedics;  Laterality: Right;   IR FLUORO GUIDE CV LINE RIGHT  04/29/2018   IR FLUORO GUIDED NEEDLE PLC ASPIRATION/INJECTION LOC  07/20/2023   IR US  GUIDE VASC ACCESS RIGHT  04/29/2018   RIGHT/LEFT HEART CATH AND CORONARY ANGIOGRAPHY N/A 05/01/2018   Procedure: RIGHT/LEFT HEART CATH AND CORONARY ANGIOGRAPHY;  Surgeon: Claudene Victory ORN, MD;  Location: [facility];  Service: Cardiovascular;  Laterality: N/A;   RIGHT/LEFT HEART CATH AND CORONARY ANGIOGRAPHY  N/A 09/29/2022   Procedure: RIGHT/LEFT HEART CATH AND CORONARY ANGIOGRAPHY;  Surgeon: Wonda Sharper, MD;  Location: [facility] INVASIVE CV LAB;  Service: Cardiovascular;  Laterality: N/A;   subdural hematoma Right 07/2020   TRANSESOPHAGEAL ECHOCARDIOGRAM (CATH LAB) N/A 07/25/2023   Procedure: TRANSESOPHAGEAL ECHOCARDIOGRAM;  Surgeon: Jeffrie Oneil BROCKS, MD;  Location: [facility];  Service: Cardiovascular;  Laterality: N/A;    Current Medications: Current Meds   Medication Sig   acetaminophen  (TYLENOL ) 500 MG tablet Take 2 tablets (1,000 mg total) by mouth every 6 (six) hours as needed for mild pain (pain score 1-3), fever or headache.   amLODipine  (NORVASC ) 10 MG tablet Take 1 tablet (10 mg total) by mouth daily.   aspirin  EC 81 MG tablet Take 81 mg by mouth daily. Swallow whole.   ceFAZolin  (ANCEF ) IVPB Inject 2 g into the vein Every Tuesday,Thursday,and Saturday with dialysis. Indication:  MSSA bacteremia/back infection  First Dose: Yes Last Day of Therapy:  09/14/23   cyanocobalamin  (VITAMIN B12) 1000 MCG tablet Take 1,000 mcg by mouth daily.   ferric citrate  (AURYXIA ) 1 GM 210 MG(Fe) tablet Take 630 mg by mouth 3 (three) times daily with meals.   hydrALAZINE  (APRESOLINE ) 25 MG tablet Take 1 tablet (25 mg total) by mouth 3 (three) times daily. Take 50 mg 3 times daily on M, W, F(dialysis days) Take 50 mg twice daily on Tu, Th, Sat, Sun(non-dialysis days)   insulin  aspart protamine  - aspart (NOVOLOG  70/30 FLEXPEN) (70-30) 100 UNIT/ML FlexPen Inject 20 Units into the skin at bedtime.   isosorbide  mononitrate (IMDUR ) 30 MG 24 hr tablet Take 1 tablet (30 mg total) by mouth 4 (four) times a week. Mon, Wed, Fri, Sun (non-dialysis days)   metoprolol  succinate (TOPROL -XL) 50 MG 24 hr tablet Take 50 mg by mouth daily.   nitroGLYCERIN  (NITROSTAT ) 0.4 MG SL tablet Place 0.4 mg under the tongue every 5 (five) minutes as needed for chest pain.   pantoprazole  (PROTONIX ) 40 MG tablet Take 1 tablet (40 mg total) by mouth daily.   rosuvastatin  (CRESTOR ) 40 MG tablet Take 40 mg by mouth daily.     Allergies:   Patient has no known allergies.   Social History   Socioeconomic History   Marital status: Divorced    Spouse name: Not on file   Number of children: 1   Years of education: Not on file   Highest education level: Not on file  Occupational History   Not on file  Tobacco Use   Smoking status: Never   Smokeless tobacco: Never  Vaping Use   Vaping  status: Never Used  Substance and Sexual Activity   Alcohol  use: Not Currently   Drug use: Never   Sexual activity: Not Currently  Other Topics Concern   Not on file  Social History Narrative   Not on file   Social Drivers of Health   Financial Resource Strain: Low Risk  (07/13/2020)   Received from [facility]   Overall Financial Resource Strain (CARDIA)    Difficulty of Paying Living Expenses: Not very hard  Food Insecurity: No Food Insecurity (07/20/2023)   Hunger Vital Sign    Worried About Running Out of Food in the Last Year: Never true    Ran Out of Food in the Last Year: Never true  Transportation Needs: No Transportation Needs (07/20/2023)   PRAPARE - Administrator, Civil Service (Medical): No    Lack of Transportation (Non-Medical): No  Physical Activity:  Not on file  Stress: Not on file  Social Connections: Socially Integrated (07/20/2023)   Social Connection and Isolation Panel    Frequency of Communication with Friends and Family: More than three times a week    Frequency of Social Gatherings with Friends and Family: More than three times a week    Attends Religious Services: More than 4 times per year    Active Member of [employer] or Organizations: Yes    Attends Banker Meetings: 1 to 4 times per year    Marital Status: Living with partner     Family History: The patient's family history includes Diabetes in his father; Stroke in his mother. ROS:   Please see the history of present illness.    All 14 point review of systems negative except as described per history of present illness  EKGs/Labs/Other Studies Reviewed:         Recent Labs: 07/15/2023: Magnesium 1.9 07/25/2023: BUN 26; Creatinine, Ser 6.40; Hemoglobin 7.9; Platelets 396; Potassium 4.4; Sodium 133 08/22/2023: ALT 4  Recent Lipid Panel    Component Value Date/Time   CHOL 109 04/25/2018 0429   TRIG 73 04/25/2018 0429   HDL 34 (L) 04/25/2018 0429   CHOLHDL 3.2  04/25/2018 0429   VLDL 15 04/25/2018 0429   LDLCALC 60 04/25/2018 0429   LDLDIRECT 32 06/05/2019 1639    Physical Exam:    VS:  BP (!) 110/54   Pulse 66   Ht 5' 8 (1.727 m)   Wt 174 lb 3.2 oz (79 kg)   SpO2 96%   BMI 26.49 kg/m     Wt Readings from Last 3 Encounters:  09/10/23 174 lb 3.2 oz (79 kg)  07/24/23 171 lb 11.8 oz (77.9 kg)  07/18/23 168 lb 10.4 oz (76.5 kg)     GEN:  Well nourished, well developed in no acute distress HEENT: Normal NECK: No JVD; No carotid bruits LYMPHATICS: No lymphadenopathy CARDIAC: RRR, systolic ejection murmur grade 2/6 to 3/6 best heard right upper portion sternal, no rubs, no gallops RESPIRATORY:  Clear to auscultation without rales, wheezing or rhonchi  ABDOMEN: Soft, non-tender, non-distended MUSCULOSKELETAL:  No edema; No deformity  SKIN: Warm and dry LOWER EXTREMITIES: no swelling NEUROLOGIC:  Alert and oriented x 3 PSYCHIATRIC:  Normal affect   ASSESSMENT:    1. Aortic valve disorder   2. Chronic systolic CHF (congestive heart failure) (HCC) - now with recovered LVEF   3. Coronary artery disease involving native coronary artery of native heart without angina pectoris   4. Type 2 diabetes mellitus with chronic kidney disease on chronic dialysis, with long-term current use of insulin  (HCC)   5. ESRD on hemodialysis (HCC) - [facility]pt HD at [facility]  on TTS 5:15 am chair time    PLAN:    In order of problems listed above:  Aortic stenosis which appears to be severe.  He will be referred to our structural heart team for evaluation.  Luckily he said he is feeling well.  Denies have any chest pain tightness squeezing pressure in chest.  Nothing changed compared to last time I have seen him. Chronic systolic congestive heart failure, echo cardiogram showed preserved ejection fraction.  He does have severe left ventricle hypertrophy with grade 2 diastolic dysfunction so we are truly dealing with diastolic dysfunction and diastolic  congestive heart failure looks compensated today on physical exam. Coronary artery disease.  On medical therapy. End-stage renal disease on hemodialysis.  He was disqualified assess potential  candidate for kidney transplant.   Medication Adjustments/Labs and Tests Ordered: Current medicines are reviewed at length with the patient today.  Concerns regarding medicines are outlined above.  Orders Placed This Encounter  Procedures   Ambulatory referral to Structural Heart/Valve Clinic (only at [facility])   Medication changes: No orders of the defined types were placed in this encounter.   Signed, Lamar DOROTHA Fitch, MD, [facility] 09/10/2023 11:52 AM     [facility]

## 2023-09-11 DIAGNOSIS — N2581 Secondary hyperparathyroidism of renal origin: Secondary | ICD-10-CM | POA: Diagnosis not present

## 2023-09-11 DIAGNOSIS — N186 End stage renal disease: Secondary | ICD-10-CM | POA: Diagnosis not present

## 2023-09-11 DIAGNOSIS — Z992 Dependence on renal dialysis: Secondary | ICD-10-CM | POA: Diagnosis not present

## 2023-09-12 DIAGNOSIS — L578 Other skin changes due to chronic exposure to nonionizing radiation: Secondary | ICD-10-CM | POA: Diagnosis not present

## 2023-09-12 DIAGNOSIS — C44619 Basal cell carcinoma of skin of left upper limb, including shoulder: Secondary | ICD-10-CM | POA: Diagnosis not present

## 2023-09-12 DIAGNOSIS — L814 Other melanin hyperpigmentation: Secondary | ICD-10-CM | POA: Diagnosis not present

## 2023-09-13 DIAGNOSIS — N186 End stage renal disease: Secondary | ICD-10-CM | POA: Diagnosis not present

## 2023-09-13 DIAGNOSIS — N2581 Secondary hyperparathyroidism of renal origin: Secondary | ICD-10-CM | POA: Diagnosis not present

## 2023-09-13 DIAGNOSIS — Z992 Dependence on renal dialysis: Secondary | ICD-10-CM | POA: Diagnosis not present

## 2023-09-19 ENCOUNTER — Telehealth: Payer: Self-pay

## 2023-09-19 NOTE — Telephone Encounter (Signed)
 Spoke with Justin [name] and relayed provider message. Understands that if pt does not want to go to ED okay to wait until appt with [facility] for evaluation. Reiterated that if he starts feeling worse or develops fever/ chills to go to ED. Lorenda CHRISTELLA Code, RMA

## 2023-09-19 NOTE — Telephone Encounter (Signed)
 Received call today from Dr. Nichole office with [facility] medical stating Nathanel (friend) called to inform staff pt has had had weakness in knees down to legs since Tuesday. Off and on. Provider reviewed lab results from appt with [facility] and noted elevated CRP. Pt concerned infection could be back.  Dr. Rosalene staff will fax results from 8/27 and note.  Spoke with patient who states that weakness has improved today. Is c/o leg pain. Has not had dialysis in one week due to weakness. Denies fever, chills, diarrhea.  Will forward message to MD to advise on next steps. Advised pt that if he notices fever, chills, or worsening symptoms to go to ED. Lorenda CHRISTELLA Code, RMA

## 2023-09-19 NOTE — Telephone Encounter (Signed)
 Would recommend going to ed as he has missed HD x 1 week

## 2023-09-20 DIAGNOSIS — Z992 Dependence on renal dialysis: Secondary | ICD-10-CM | POA: Diagnosis not present

## 2023-09-20 DIAGNOSIS — N186 End stage renal disease: Secondary | ICD-10-CM | POA: Diagnosis not present

## 2023-09-20 DIAGNOSIS — N2581 Secondary hyperparathyroidism of renal origin: Secondary | ICD-10-CM | POA: Diagnosis not present

## 2023-09-22 ENCOUNTER — Other Ambulatory Visit (HOSPITAL_BASED_OUTPATIENT_CLINIC_OR_DEPARTMENT_OTHER): Payer: Self-pay | Admitting: Cardiology

## 2023-09-22 DIAGNOSIS — N2581 Secondary hyperparathyroidism of renal origin: Secondary | ICD-10-CM | POA: Diagnosis not present

## 2023-09-22 DIAGNOSIS — I25118 Atherosclerotic heart disease of native coronary artery with other forms of angina pectoris: Secondary | ICD-10-CM

## 2023-09-22 DIAGNOSIS — I350 Nonrheumatic aortic (valve) stenosis: Secondary | ICD-10-CM

## 2023-09-22 DIAGNOSIS — I10 Essential (primary) hypertension: Secondary | ICD-10-CM

## 2023-09-22 DIAGNOSIS — Z992 Dependence on renal dialysis: Secondary | ICD-10-CM | POA: Diagnosis not present

## 2023-09-22 DIAGNOSIS — N186 End stage renal disease: Secondary | ICD-10-CM | POA: Diagnosis not present

## 2023-09-24 ENCOUNTER — Ambulatory Visit: Attending: Cardiovascular Disease | Admitting: Cardiovascular Disease

## 2023-09-24 ENCOUNTER — Encounter: Payer: Self-pay | Admitting: Cardiovascular Disease

## 2023-09-24 VITALS — BP 130/64 | HR 62 | Ht 69.0 in | Wt 173.2 lb

## 2023-09-24 DIAGNOSIS — I350 Nonrheumatic aortic (valve) stenosis: Secondary | ICD-10-CM | POA: Diagnosis not present

## 2023-09-24 DIAGNOSIS — I2510 Atherosclerotic heart disease of native coronary artery without angina pectoris: Secondary | ICD-10-CM

## 2023-09-24 NOTE — Assessment & Plan Note (Signed)
 Cardiac catheterization data reviewed.  No anginal symptoms noted.  Continue medical management.

## 2023-09-24 NOTE — Assessment & Plan Note (Signed)
 The patient has moderately severe aortic stenosis.  I personally reviewed his most recent echo images.  On 2D imaging he has moderate calcification and restriction of the aortic valve leaflets, visually with the appearance of moderate aortic stenosis.  Cardiac catheterization last year showed a mean gradient of 20 mmHg and the patient was found to have high flow due to his AV fistula.  Some of his echo parameters are more suggestive of severe paradoxical low-flow low gradient aortic stenosis with calculated valve area of 0.8 cm, mean gradient 24 mmHg, V-max 3.2 m/s, stroke-volume index of 33, and dimensionless index of 0.23.  The patient appears to be completely asymptomatic.  He is at significant risk of problems related to TAVR with end-stage renal disease and infection risk.  He was just recently hospitalized with MRSA bacteremia and an epidural abscess.  Considering all of these factors (asymptomatic, borderline criteria for severe AS, and ESRD/infection risk), I would favor ongoing surveillance.  Through shared decision making conversation, the patient is not inclined to proceed with intervention at this time.  He is in agreement with clinical surveillance and I will see him back in 1 year with an echo prior to the visit.  He understands to watch out for progressive shortness of breath, chest discomfort, or other symptoms that could be related to aortic stenosis.

## 2023-09-24 NOTE — Progress Notes (Signed)
 Cardiology Office Note:    Date:  09/24/2023   ID:  Erasmus, Bistline [birth year], MRN [id number]  PCP:  Bernie Lamar PARAS, MD   [facility] Providers Cardiologist:  Redell Shallow, MD     Referring MD: Bernie Lamar PARAS, MD   Chief Complaint  Patient presents with   Aortic Stenosis    History of Present Illness:    Justin [name] is a 73 y.o. male presenting for evaluation of aortic stenosis.  He has been followed closely by Dr. Bernie.  I met him last year when I performed a cardiac catheterization on the patient. The patient has undergone several studies with regard to his aortic stenosis recently over the past year.  Cardiac catheterization 09/29/2022 showed normal right heart hemodynamics with high cardiac output, moderate aortic stenosis with a mean gradient of 20 mmHg, and a calculated aortic valve area of 2.2 cm felt to be overestimated with respect to his high flow state.  He was found to have coronary artery disease with severe proximal circumflex stenosis supplying a small area of myocardium and patency of the left main, LAD, and RCA with nonobstructive disease.  Medical management was recommended.  He then had an echocardiogram in July 2025 showing an LVEF of 60 to 65%, moderate calcification of the aortic valve, mild aortic regurgitation, and moderate to severe aortic stenosis with a mean gradient of 24 mmHg, peak transaortic velocity of 3.2 m/s, and calculated aortic valve area of 0.8 cm.  A transesophageal echo a few weeks later suggested moderate aortic stenosis with a mean gradient of 18 mmHg.  He worked in [employer]. He's been on hemodialysis now for 5 years. The patient was recently hospitalized with epidural abscess and MRSA bacteremia.  TEE demonstrated no evidence of endocarditis.  He continues to work and denies any symptoms.  States that he has no chest pain, chest pressure, shortness of breath, lightheadedness, or weakness.  He denies  orthopnea, PND, or leg swelling.  He dialyzes on Tuesdays, Thursdays, and Saturdays.    Current Medications: Current Meds  Medication Sig   acetaminophen  (TYLENOL ) 500 MG tablet Take 2 tablets (1,000 mg total) by mouth every 6 (six) hours as needed for mild pain (pain score 1-3), fever or headache.   amLODipine  (NORVASC ) 10 MG tablet Take 1 tablet (10 mg total) by mouth daily.   aspirin  EC 81 MG tablet Take 81 mg by mouth daily. Swallow whole.   cyanocobalamin  (VITAMIN B12) 1000 MCG tablet Take 1,000 mcg by mouth daily.   ferric citrate  (AURYXIA ) 1 GM 210 MG(Fe) tablet Take 630 mg by mouth 3 (three) times daily with meals.   hydrALAZINE  (APRESOLINE ) 25 MG tablet Take 1 tablet (25 mg total) by mouth 3 (three) times daily. Take 50 mg 3 times daily on M, W, F(dialysis days) Take 50 mg twice daily on Tu, Th, Sat, Sun(non-dialysis days) (Patient taking differently: Take 25 mg by mouth 3 (three) times daily. Taking 50 mg daily. (On dialysis days T., Th., Sat., taking 50 mg).)   insulin  aspart protamine  - aspart (NOVOLOG  70/30 FLEXPEN) (70-30) 100 UNIT/ML FlexPen Inject 20 Units into the skin at bedtime.   isosorbide  mononitrate (IMDUR ) 30 MG 24 hr tablet TAKE 1 TABLET BY MOUTH 4 TIMES WEEKLY, ON MONDAY, WEDNESDAY, FRIDAY, AND SUNDAY (NON-DIALYSIS DAYS) (Patient taking differently: 30 mg.)   metoprolol  succinate (TOPROL -XL) 50 MG 24 hr tablet Take 50 mg by mouth daily.   nitroGLYCERIN  (NITROSTAT ) 0.4 MG SL tablet  Place 0.4 mg under the tongue every 5 (five) minutes as needed for chest pain.   pantoprazole  (PROTONIX ) 40 MG tablet Take 1 tablet (40 mg total) by mouth daily.   rosuvastatin  (CRESTOR ) 40 MG tablet Take 40 mg by mouth daily.     Allergies:   Patient has no known allergies.   Past Medical History:  Diagnosis Date   Acute postoperative anemia due to expected blood loss 02/23/2023   Anemia in chronic kidney disease 05/01/2018   Aortic valve disorder 05/07/2018   Bacteremia due to  methicillin susceptible Staphylococcus aureus (MSSA) 07/19/2023   Bilateral carotid artery stenosis 06/06/2023   BPH (benign prostatic hyperplasia)    Chronic systolic CHF (congestive heart failure) (HCC) - now with recovered LVEF 04/24/2018   Closed comminuted intertrochanteric fracture of proximal end of femur with nonunion, right 02/16/2023   Coagulation defect, unspecified (HCC) 05/01/2018   Coronary artery disease involving native coronary artery of native heart without angina pectoris 11/19/2019   LHC 09-29-2022     1.  Normal right heart hemodynamics with preserved cardiac output of 8.96 L/min and cardiac index of 4.5 L/min/m (likely high flow with hemodialysis/AV fistula)  2.  Moderate aortic stenosis with mean gradient 20 mmHg, calculated valve area 2.2 cm likely overestimated with high flow state.  Valve crossed with a J-wire.  3.  Patent left main with no stenosis  4.  Patent LAD with   Delirium due to multiple etiologies 02/23/2023   Dependence on renal dialysis 02/26/2023   Diabetes [facility])    Dysuria    Erectile dysfunction    Erosive esophagitis 06/06/2023   Esophagitis 02/25/2023   ESRD on hemodialysis [facility])    Fall 02/26/2023   Fever, unspecified 02/26/2023   Gastroesophageal reflux disease with esophagitis without hemorrhage 07/27/2023   Gastroesophageal reflux disease without esophagitis 02/26/2023   Globus sensation 02/23/2023   Hardening of the aorta (main artery of the heart) 09/07/2023   Headache, unspecified 02/26/2023   Hemorrhagic shock (HCC) 07/13/2023   Hiccups 02/24/2023   History of chronic CHF 11/19/2019   Hyperlipemia    Hypertension    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) 06/06/2023   Hypertensive heart disease with congestive heart failure and stage 5 kidney disease (HCC) 04/24/2018   Hypoalbuminemia 09/07/2023   Insomnia due to medical condition 02/25/2023   Intraspinal abscess and granuloma 07/26/2023   Iron  deficiency anemia,  unspecified 05/01/2018   Leukocytosis 09/07/2023   Long term (current) use of insulin  (HCC) 04/24/2018   Lower urinary tract symptoms due to benign prostatic hyperplasia 05/20/2014   Mild protein-calorie malnutrition 05/02/2018   Nonrheumatic aortic valve stenosis    Occlusion and stenosis of bilateral carotid arteries 09/07/2023   Other disorders of phosphorus metabolism 06/28/2018   Other fluid overload 02/26/2023   Pain, unspecified 05/01/2018   Personal history of COVID-19 11/19/2019   Pinna disorder, left    Pruritus, unspecified 05/01/2018   Pure hypercholesterolemia 02/26/2023   Renal disorder 10/18/2020   S/P ORIF (open reduction internal fixation) fracture - right intertrochanteric fracture 02/17/2023   Secondary hyperparathyroidism of renal origin 05/01/2018   Shortness of breath 05/01/2018   Type 2 diabetes mellitus with chronic kidney disease on chronic dialysis, with long-term current use of insulin  (HCC) 04/24/2018   Type 2 diabetes mellitus with hyperglycemia (HCC) 02/16/2023   Vitamin B12 deficiency 06/06/2023   Wears glasses    Past Surgical History:  Procedure Laterality Date   A/V FISTULAGRAM Left 07/16/2023  Procedure: A/V Fistulagram;  Surgeon: Serene Gaile ORN, MD;  Location: [facility] INVASIVE CV LAB;  Service: Cardiovascular;  Laterality: Left;   A/V SHUNT INTERVENTION N/A 04/16/2023   Procedure: A/V SHUNT INTERVENTION;  Surgeon: Norine Manuelita LABOR, MD;  Location: [facility] INVASIVE CV LAB;  Service: Cardiovascular;  Laterality: N/A;   AV FISTULA PLACEMENT Left 04/30/2018   Procedure: CREATION OF RADIOCEPHALIC ARTERIOVENOUS FISTULA LEFT ARM;  Surgeon: Harvey Carlin BRAVO, MD;  Location: [facility] OR;  Service: Vascular;  Laterality: Left;   AV FISTULA PLACEMENT Left 08/28/2018   Procedure: ARTERIOVENOUS (AV) BRACHIOCEPHALIC FISTULA CREATION LEFT UPPER ARM;  Surgeon: Gretta Lonni PARAS, MD;  Location: [facility] OR;  Service: Vascular;  Laterality: Left;   BIOPSY  02/24/2023   Procedure:  BIOPSY;  Surgeon: Federico Rosario BROCKS, MD;  Location: [facility] ENDOSCOPY;  Service: Gastroenterology;;   ESOPHAGOGASTRODUODENOSCOPY N/A 07/23/2023   Procedure: EGD (ESOPHAGOGASTRODUODENOSCOPY);  Surgeon: Nandigam, Kavitha V, MD;  Location[facility] ENDOSCOPY;  Service: Gastroenterology;  Laterality: N/A;   ESOPHAGOGASTRODUODENOSCOPY (EGD) WITH PROPOFOL  N/A 02/24/2023   Procedure: ESOPHAGOGASTRODUODENOSCOPY (EGD) WITH PROPOFOL ;  Surgeon: Federico Rosario BROCKS, MD;  Location: [facility] ENDOSCOPY;  Service: Gastroenterology;  Laterality: N/A;  Patint is s/p right hip ORIF   INTRAMEDULLARY (IM) NAIL INTERTROCHANTERIC Right 02/17/2023   Procedure: INTRAMEDULLARY (IM) NAIL INTERTROCHANTERIC;  Surgeon: Sherida Adine BROCKS, MD;  Location: [facility] OR;  Service: Orthopedics;  Laterality: Right;   IR FLUORO GUIDE CV LINE RIGHT  04/29/2018   IR FLUORO GUIDED NEEDLE PLC ASPIRATION/INJECTION LOC  07/20/2023   IR US  GUIDE VASC ACCESS RIGHT  04/29/2018   RIGHT/LEFT HEART CATH AND CORONARY ANGIOGRAPHY N/A 05/01/2018   Procedure: RIGHT/LEFT HEART CATH AND CORONARY ANGIOGRAPHY;  Surgeon: Claudene Victory ORN, MD;  Location: [facility];  Service: Cardiovascular;  Laterality: N/A;   RIGHT/LEFT HEART CATH AND CORONARY ANGIOGRAPHY N/A 09/29/2022   Procedure: RIGHT/LEFT HEART CATH AND CORONARY ANGIOGRAPHY;  Surgeon: Wonda Sharper, MD;  Location: [facility] INVASIVE CV LAB;  Service: Cardiovascular;  Laterality: N/A;   subdural hematoma Right 07/2020   TRANSESOPHAGEAL ECHOCARDIOGRAM (CATH LAB) N/A 07/25/2023   Procedure: TRANSESOPHAGEAL ECHOCARDIOGRAM;  Surgeon: Jeffrie Oneil BROCKS, MD;  Location: [facility];  Service: Cardiovascular;  Laterality: N/A;     ROS:   Please see the history of present illness.    All other systems reviewed and are negative.  EKGs/Labs/Other Studies Reviewed:    The following studies were reviewed today: Cardiac Studies & Procedures    ______________________________________________________________________________________________ CARDIAC CATHETERIZATION  CARDIAC CATHETERIZATION 09/29/2022  Conclusion 1.  Normal right heart hemodynamics with preserved cardiac output of 8.96 L/min and cardiac index of 4.5 L/min/m (likely high flow with hemodialysis/AV fistula) 2.  Moderate aortic stenosis with mean gradient 20 mmHg, calculated valve area 2.2 cm likely overestimated with high flow state.  Valve crossed with a J-wire. 3.  Patent left main with no stenosis 4.  Patent LAD with moderate nonobstructive stenosis stable from the previous study, moderately severe first diagonal stenosis of 70% 5.  Severe proximal left circumflex stenosis, supplies very small territory myocardium 6.  Patent, large dominant RCA with mild nonobstructive plaquing and severe stenosis of the PDA branch stable from the previous study  Recommendations: Favor continued Risk analyst.  The patient has moderate aortic stenosis at most.  His coronary artery disease should be managed medically and in absence of angina.  He has branch vessel disease with significant stenoses in a diagonal branch, AV circumflex that supplies a very small vascular territory, and right PDA.  Findings Coronary Findings Diagnostic  Dominance: Right  Left Anterior Descending Prox LAD-1 lesion is 50% stenosed. Prox LAD-2 lesion is 60% stenosed.  First Diagonal Branch Ost 1st Diag lesion is 75% stenosed. Ost 1st Diag to 1st Diag lesion is 60% stenosed.  Ramus Intermedius Vessel is large. Ost Ramus to Ramus lesion is 30% stenosed.  Left Circumflex Ost Cx to Prox Cx lesion is 90% stenosed.  First Obtuse Marginal Branch Vessel is small in size. Ost 1st Mrg lesion is 100% stenosed.  Second Obtuse Marginal Branch Vessel is small in size.  Right Coronary Artery Mid RCA lesion is 40% stenosed. The lesion is calcified. Dist RCA lesion is 30% stenosed. The lesion is  moderately calcified.  Right Posterior Descending Artery Ost RPDA lesion is 80% stenosed. The lesion is moderately calcified.  Intervention  No interventions have been documented.   CARDIAC CATHETERIZATION  CARDIAC CATHETERIZATION 05/01/2018  Conclusion  Obstructive coronary artery disease with absence of proximal severe CAD.  Mid LAD 50 to 60%, apical LAD 90%, first diagonal 75%.  Proximal circumflex 75% giving origin to 2 very small obtuse marginals, the first of which is totally occluded.  Large dominant ramus intermedius supplying much of the territory of the circumflex.  Dominant RCA with mid vessel 50% stenosis and proximal to mid 95% PDA.  Mild pulmonary hypertension.  Minimal aortic stenosis with approximately 9 mm peak to peak transvalvular gradient.  Upper normal LVEDP, 18 mmHg.  RECOMMENDATIONS:   Aggressive secondary risk factor modification.  LDL less than 70, hemoglobin A1c less than 7, and blood pressure 130/80 mmHg or less.  81 mg aspirin  daily unless contraindicated.  Anti-ischemic therapy as needed for chest discomfort.  Findings Coronary Findings Diagnostic  Dominance: Right  Left Anterior Descending Prox LAD lesion is 50% stenosed. Dist LAD lesion is 90% stenosed.  First Diagonal Branch Ost 1st Diag lesion is 75% stenosed. Ost 1st Diag to 1st Diag lesion is 60% stenosed.  Ramus Intermedius Vessel is large. Ost Ramus to Ramus lesion is 20% stenosed.  Left Circumflex Ost Cx to Prox Cx lesion is 70% stenosed.  First Obtuse Marginal Branch Vessel is small in size. Ost 1st Mrg lesion is 100% stenosed.  Second Obtuse Marginal Branch Vessel is small in size.  Right Coronary Artery Mid RCA lesion is 60% stenosed.  Right Posterior Descending Artery Ost RPDA lesion is 90% stenosed.  Intervention  No interventions have been documented.   STRESS TESTS  MYOCARDIAL PERFUSION IMAGING 08/09/2022  Interpretation Summary   Findings are  consistent with mild ischemia involving mid portion of the inferior wall. The study is low risk.   No ST deviation was noted.   Left ventricular function is normal. Nuclear stress EF: 53%. The left ventricular ejection fraction is mildly decreased (45-54%). End diastolic cavity size is normal.   ECHOCARDIOGRAM  ECHOCARDIOGRAM COMPLETE 07/11/2023  Narrative ECHOCARDIOGRAM REPORT    Patient Name:   LOUDEN HOUSEWORTH Date of Exam: 07/11/2023 Medical Rec #:  [id number]     Height:       68.0 in Accession #:    [id number]    Weight:       173.8 lb Date of Birth:  December 15, 1950      BSA:          1.925 m Patient Age:    73 years      BP:           140/58 mmHg Patient Gender: M  HR:           62 bpm. Exam Location:  [place]  Procedure: 2D Echo, Cardiac Doppler, Color Doppler and Strain Analysis (Both Spectral and Color Flow Doppler were utilized during procedure).  Indications:    Dyspnea on exertion [R06.09 (ICD-10-CM)]  History:        Patient has prior history of Echocardiogram examinations, most recent 08/30/2022. CHF, CAD, Aortic Valve Disease; Risk Factors:Diabetes, Dyslipidemia and Hypertension. End-stage kidney disease on dialysis.  Sonographer:    Charlie Jointer RDCS Referring Phys: [id number] [name] [name]  IMPRESSIONS   1. Left ventricular ejection fraction, by estimation, is 60 to 65%. The left ventricle has normal function. The left ventricle has no regional wall motion abnormalities. There is severe left ventricular hypertrophy. Left ventricular diastolic parameters are consistent with Grade II diastolic dysfunction (pseudonormalization). 2. Right ventricular systolic function is normal. The right ventricular size is mildly enlarged. 3. Left atrial size was severely dilated. 4. A small pericardial effusion is present. The pericardial effusion is circumferential. There is no evidence of cardiac tamponade. 5. The mitral valve is degenerative. Mild mitral valve  regurgitation. No evidence of mitral stenosis. 6. The aortic valve is tricuspid. There is moderate calcification of the aortic valve. Aortic valve regurgitation is mild. Moderate to severe aortic valve stenosis. Aortic valve area, by VTI measures 0.79 cm. Aortic valve mean gradient measures 23.8 mmHg. Aortic valve Vmax measures 3.21 m/s. 7. The inferior vena cava is normal in size with greater than 50% respiratory variability, suggesting right atrial pressure of 3 mmHg.  Comparison(s): In comparison prior TTE 8/8/82024 LVEF 60-65%, grade 1DD, trace MR, AS-paradoxical low gradient severe, small-moderate pericardial effusion.  FINDINGS Left Ventricle: Left ventricular ejection fraction, by estimation, is 60 to 65%. The left ventricle has normal function. The left ventricle has no regional wall motion abnormalities. Global longitudinal strain performed but not reported based on interpreter judgement due to suboptimal tracking. The left ventricular internal cavity size was normal in size. There is severe left ventricular hypertrophy. Left ventricular diastolic parameters are consistent with Grade II diastolic dysfunction (pseudonormalization).  Right Ventricle: The right ventricular size is mildly enlarged. Right vetricular wall thickness was not well visualized. Right ventricular systolic function is normal.  Left Atrium: Left atrial size was severely dilated.  Right Atrium: Right atrial size was normal in size.  Pericardium: A small pericardial effusion is present. The pericardial effusion is circumferential. There is no evidence of cardiac tamponade.  Mitral Valve: The mitral valve is degenerative in appearance. Mild mitral annular calcification. Mild mitral valve regurgitation. No evidence of mitral valve stenosis.  Tricuspid Valve: The tricuspid valve is not well visualized. Tricuspid valve regurgitation is mild . No evidence of tricuspid stenosis.  Aortic Valve: The aortic valve is  tricuspid. There is moderate calcification of the aortic valve. Aortic valve regurgitation is mild. Moderate to severe aortic stenosis is present. Aortic valve mean gradient measures 23.8 mmHg. Aortic valve peak gradient measures 41.3 mmHg. Aortic valve area, by VTI measures 0.79 cm.  Pulmonic Valve: The pulmonic valve was grossly normal. Pulmonic valve regurgitation is trivial. No evidence of pulmonic stenosis.  Aorta: The aortic root and ascending aorta are structurally normal, with no evidence of dilitation.  Venous: The inferior vena cava is normal in size with greater than 50% respiratory variability, suggesting right atrial pressure of 3 mmHg.  IAS/Shunts: The interatrial septum was not well visualized.   LEFT VENTRICLE PLAX 2D LVIDd:  4.70 cm   Diastology LVIDs:         3.10 cm   LV e' medial:    4.68 cm/s LV PW:         1.60 cm   LV E/e' medial:  15.8 LV IVS:        1.70 cm   LV e' lateral:   6.60 cm/s LVOT diam:     2.10 cm   LV E/e' lateral: 11.2 LV SV:         63 LV SV Index:   33 LVOT Area:     3.46 cm   RIGHT VENTRICLE             IVC RV Basal diam:  4.10 cm     IVC diam: 2.00 cm RV Mid diam:    3.30 cm RV S prime:     12.43 cm/s TAPSE (M-mode): 2.3 cm  LEFT ATRIUM            Index        RIGHT ATRIUM           Index LA diam:      4.50 cm  2.34 cm/m   RA Area:     14.80 cm LA Vol (A2C): 106.0 ml 55.06 ml/m  RA Volume:   37.50 ml  19.48 ml/m LA Vol (A4C): 102.0 ml 52.98 ml/m AORTIC VALVE AV Area (Vmax):    0.86 cm AV Area (Vmean):   0.77 cm AV Area (VTI):     0.79 cm AV Vmax:           321.40 cm/s AV Vmean:          229.200 cm/s AV VTI:            0.792 m AV Peak Grad:      41.3 mmHg AV Mean Grad:      23.8 mmHg LVOT Vmax:         79.93 cm/s LVOT Vmean:        50.800 cm/s LVOT VTI:          0.182 m LVOT/AV VTI ratio: 0.23  AORTA Ao Root diam: 3.70 cm Ao Asc diam:  3.60 cm Ao Desc diam: 2.10 cm  MITRAL VALVE MV Area (PHT): 3.78 cm     SHUNTS MV Decel Time: 201 msec    Systemic VTI:  0.18 m MR Peak grad: 48.2 mmHg    Systemic Diam: 2.10 cm MR Vmax:      347.00 cm/s MV E velocity: 73.90 cm/s MV A velocity: 73.55 cm/s MV E/A ratio:  1.00  Sreedhar reddy Madireddy Electronically signed by Alean reddy Madireddy Signature Date/Time: 07/12/2023/8:26:22 PM    Final   TEE  ECHO TEE 07/25/2023  Narrative TRANSESOPHOGEAL ECHO REPORT    Patient Name:   OSWIN GRIFFITH Date of Exam: 07/25/2023 Medical Rec #:  [id number]     Height:       68.0 in Accession #:    [id number]    Weight:       171.7 lb Date of Birth:  1950/10/14      BSA:          1.916 m Patient Age:    73 years      BP:           185/69 mmHg Patient Gender: M             HR:           46 bpm.  Exam Location:  Inpatient  Procedure: Transesophageal Echo, Cardiac Doppler and Color Doppler (Both Spectral and Color Flow Doppler were utilized during procedure).  Indications:     Endocarditis  History:         Patient has prior history of Echocardiogram examinations, most recent 07/11/2023. CHF, CAD, Aortic Valve Disease; Risk Factors:Hypertension and Dyslipidemia.  Sonographer:     Philomena Daring Referring Phys:  [id number] UJBONM A PARCELLS Diagnosing Phys: Oneil Parchment MD  PROCEDURE: After discussion of the risks and benefits of a TEE, an informed consent was obtained from the patient. The transesophogeal probe was passed without difficulty through the esophogus of the patient. Imaged were obtained with the patient in a left lateral decubitus position. Sedation performed by different physician. The patient was monitored while under deep sedation. Anesthestetic sedation was provided intravenously by Anesthesiology: 188mg  of Propofol . The patient's vital signs; including heart rate, blood pressure, and oxygen  saturation; remained stable throughout the procedure. The patient developed no complications during the procedure.  IMPRESSIONS   1. Left ventricular  ejection fraction, by estimation, is 55 to 60%. The left ventricle has normal function. The left ventricle has no regional wall motion abnormalities. 2. Right ventricular systolic function is normal. The right ventricular size is normal. 3. No left atrial/left atrial appendage thrombus was detected. 4. A small pericardial effusion is present. The pericardial effusion is posterior to the left ventricle. 5. The mitral valve is normal in structure. Mild mitral valve regurgitation. No evidence of mitral stenosis. 6. The aortic valve is tricuspid. There is moderate calcification of the aortic valve. There is moderate thickening of the aortic valve. Aortic valve regurgitation is mild. Moderate aortic valve stenosis. Aortic valve mean gradient measures 17.5 mmHg. Aortic valve Vmax measures 2.60 m/s. 7. There is mild (Grade II) plaque. 8. The inferior vena cava is normal in size with greater than 50% respiratory variability, suggesting right atrial pressure of 3 mmHg.  Conclusion(s)/Recommendation(s): No evidence of vegetation/infective endocarditis on this transesophageael echocardiogram.  FINDINGS Left Ventricle: Left ventricular ejection fraction, by estimation, is 55 to 60%. The left ventricle has normal function. The left ventricle has no regional wall motion abnormalities. The left ventricular internal cavity size was normal in size. There is no left ventricular hypertrophy.  Right Ventricle: The right ventricular size is normal. No increase in right ventricular wall thickness. Right ventricular systolic function is normal.  Left Atrium: Left atrial size was normal in size. No left atrial/left atrial appendage thrombus was detected.  Right Atrium: Right atrial size was normal in size.  Pericardium: A small pericardial effusion is present. The pericardial effusion is posterior to the left ventricle.  Mitral Valve: The mitral valve is normal in structure. Mild mitral valve regurgitation. No  evidence of mitral valve stenosis.  Tricuspid Valve: The tricuspid valve is normal in structure. Tricuspid valve regurgitation is mild . No evidence of tricuspid stenosis.  Aortic Valve: The aortic valve is tricuspid. There is moderate calcification of the aortic valve. There is moderate thickening of the aortic valve. Aortic valve regurgitation is mild. Moderate aortic stenosis is present. Aortic valve mean gradient measures 17.5 mmHg. Aortic valve peak gradient measures 26.9 mmHg.  Pulmonic Valve: The pulmonic valve was normal in structure. Pulmonic valve regurgitation is not visualized. No evidence of pulmonic stenosis.  Aorta: The aortic root is normal in size and structure. There is mild (Grade II) plaque.  Venous: The inferior vena cava is normal in size with greater than 50% respiratory variability, suggesting right  atrial pressure of 3 mmHg.  IAS/Shunts: No atrial level shunt detected by color flow Doppler.  Additional Comments: Spectral Doppler performed.  LEFT VENTRICLE PLAX 2D LVOT diam:     2.30 cm LVOT Area:     4.15 cm   AORTIC VALVE AV Vmax:      259.50 cm/s AV Vmean:     201.000 cm/s AV VTI:       0.682 m AV Peak Grad: 26.9 mmHg AV Mean Grad: 17.5 mmHg   SHUNTS Systemic Diam: 2.30 cm  Oneil Parchment MD Electronically signed by Oneil Parchment MD Signature Date/Time: 07/25/2023/9:51:04 AM    Final (Updated)  MONITORS  LONG TERM MONITOR (3-14 DAYS) 03/22/2021  Narrative Patch Wear Time:  7 days and 20 hours (2023-03-01T16:45:59-0500 to 2023-03-09T13:25:18-0500)  Patient had a min HR of 31 bpm, max HR of 141 bpm, and avg HR of 61 bpm. Predominant underlying rhythm was Sinus Rhythm. First Degree AV Block was present. Bundle Branch Block/IVCD was present.  Second Degree AV Block-Mobitz I (Wenckebach) was present infrequently.  There were no pauses of 3 seconds or greater.  There was 1 run of Ventricular Tachycardia occurred lasting 4 beats with an avg 134  bpm.  4 Supraventricular Tachycardia runs occurred, the run with the fastest interval lasting 34.2 secs with a max rate of 100 bpm, the longest lasting 55.9 secs  with an avg rate of 94 bpm. . Isolated SVEs were rare (<1.0%), SVE Couplets were rare (<1.0%), and SVE Triplets were rare (<1.0%).  There were no episodes of atrial fibrillation or flutter  Isolated VEs were rare (<1.0%), VE Couplets were rare (<1.0%), and no VE Triplets were present. Ventricular Bigeminy and Trigeminy were present.       ______________________________________________________________________________________________      EKG:   EKG Interpretation Date/Time:  Monday September 24 2023 16:16:58 EDT Ventricular Rate:  62 PR Interval:  318 QRS Duration:  150 QT Interval:  496 QTC Calculation: 503 R Axis:   -66  Text Interpretation: Sinus rhythm with 1st degree A-V block Left axis deviation Right bundle branch block Inferior infarct , age undetermined When compared with ECG of 19-Jul-2023 11:59, Inferior infarct is now Present T wave inversion no longer evident in Anterior leads Confirmed by Wonda Sharper [phone]) on 09/24/2023 4:20:39 PM    Recent Labs: 07/15/2023: Magnesium 1.9 07/25/2023: BUN 26; Creatinine, Ser 6.40; Hemoglobin 7.9; Platelets 396; Potassium 4.4; Sodium 133 08/22/2023: ALT 4  Recent Lipid Panel    Component Value Date/Time   CHOL 109 04/25/2018 0429   TRIG 73 04/25/2018 0429   HDL 34 (L) 04/25/2018 0429   CHOLHDL 3.2 04/25/2018 0429   VLDL 15 04/25/2018 0429   LDLCALC 60 04/25/2018 0429   LDLDIRECT 32 06/05/2019 1639     Risk Assessment/Calculations:                Physical Exam:    VS:  BP 130/64   Pulse 62   Ht 5' 9 (1.753 m)   Wt 173 lb 3.2 oz (78.6 kg)   SpO2 98%   BMI 25.58 kg/m     Wt Readings from Last 3 Encounters:  09/24/23 173 lb 3.2 oz (78.6 kg)  09/10/23 174 lb 3.2 oz (79 kg)  07/24/23 171 lb 11.8 oz (77.9 kg)     GEN:  Well nourished, well developed in  no acute distress HEENT: Normal NECK: No JVD; No carotid bruits LYMPHATICS: No lymphadenopathy CARDIAC: RRR, no murmurs, rubs, gallops RESPIRATORY:  Clear  to auscultation without rales, wheezing or rhonchi  ABDOMEN: Soft, non-tender, non-distended MUSCULOSKELETAL:  No edema; No deformity  SKIN: Warm and dry NEUROLOGIC:  Alert and oriented x 3 PSYCHIATRIC:  Normal affect   Assessment & Plan Nonrheumatic aortic valve stenosis The patient has moderately severe aortic stenosis.  I personally reviewed his most recent echo images.  On 2D imaging he has moderate calcification and restriction of the aortic valve leaflets, visually with the appearance of moderate aortic stenosis.  Cardiac catheterization last year showed a mean gradient of 20 mmHg and the patient was found to have high flow due to his AV fistula.  Some of his echo parameters are more suggestive of severe paradoxical low-flow low gradient aortic stenosis with calculated valve area of 0.8 cm, mean gradient 24 mmHg, V-max 3.2 m/s, stroke-volume index of 33, and dimensionless index of 0.23.  The patient appears to be completely asymptomatic.  He is at significant risk of problems related to TAVR with end-stage renal disease and infection risk.  He was just recently hospitalized with MRSA bacteremia and an epidural abscess.  Considering all of these factors (asymptomatic, borderline criteria for severe AS, and ESRD/infection risk), I would favor ongoing surveillance.  Through shared decision making conversation, the patient is not inclined to proceed with intervention at this time.  He is in agreement with clinical surveillance and I will see him back in 1 year with an echo prior to the visit.  He understands to watch out for progressive shortness of breath, chest discomfort, or other symptoms that could be related to aortic stenosis. Coronary artery disease involving native coronary artery of native heart without angina pectoris Cardiac  catheterization data reviewed.  No anginal symptoms noted.  Continue medical management.            Medication Adjustments/Labs and Tests Ordered: Current medicines are reviewed at length with the patient today.  Concerns regarding medicines are outlined above.  Orders Placed This Encounter  Procedures   EKG 12-Lead   ECHOCARDIOGRAM COMPLETE   No orders of the defined types were placed in this encounter.   Patient Instructions  Medication Instructions:  No medication changes were made at this visit. Continue current regimen.   *If you need a refill on your cardiac medications before your next appointment, please call your pharmacy*  Lab Work: None ordered today. If you have labs (blood work) drawn today and your tests are completely normal, you will receive your results only by: MyChart Message (if you have MyChart) OR A paper copy in the mail If you have any lab test that is abnormal or we need to change your treatment, we will call you to review the results.  Testing/Procedures: Your physician has requested that you have an echocardiogram prior to 1 year follow-up appointment with Dr. Wonda. Echocardiography is a painless test that uses sound waves to create images of your heart. It provides your doctor with information about the size and shape of your heart and how well your heart's chambers and valves are working. This procedure takes approximately one hour. There are no restrictions for this procedure. Please do NOT wear cologne, perfume, aftershave, or lotions (deodorant is allowed). Please arrive 15 minutes prior to your appointment time.  Please note: We ask at that you not bring children with you during ultrasound (echo/ vascular) testing. Due to room size and safety concerns, children are not allowed in the ultrasound rooms during exams. Our front office staff cannot provide observation of children in  our lobby area while testing is being conducted. An adult  accompanying a patient to their appointment will only be allowed in the ultrasound room at the discretion of the ultrasound technician under special circumstances. We apologize for any inconvenience.   Follow-Up: At [facility], you and your health needs are our priority.  As part of our continuing mission to provide you with exceptional heart care, our providers are all part of one team.  This team includes your primary Cardiologist (physician) and Advanced Practice Providers or APPs (Physician Assistants and Nurse Practitioners) who all work together to provide you with the care you need, when you need it.  Your next appointment:   1 year(s)  Provider:   Ozell Fell, MD      Signed, Ozell Fell, MD  09/24/2023 5:17 PM    [facility]

## 2023-09-24 NOTE — Patient Instructions (Signed)
 Medication Instructions:  No medication changes were made at this visit. Continue current regimen.   *If you need a refill on your cardiac medications before your next appointment, please call your pharmacy*  Lab Work: None ordered today. If you have labs (blood work) drawn today and your tests are completely normal, you will receive your results only by: MyChart Message (if you have MyChart) OR A paper copy in the mail If you have any lab test that is abnormal or we need to change your treatment, we will call you to review the results.  Testing/Procedures: Your physician has requested that you have an echocardiogram prior to 1 year follow-up appointment with Dr. Wonda. Echocardiography is a painless test that uses sound waves to create images of your heart. It provides your doctor with information about the size and shape of your heart and how well your heart's chambers and valves are working. This procedure takes approximately one hour. There are no restrictions for this procedure. Please do NOT wear cologne, perfume, aftershave, or lotions (deodorant is allowed). Please arrive 15 minutes prior to your appointment time.  Please note: We ask at that you not bring children with you during ultrasound (echo/ vascular) testing. Due to room size and safety concerns, children are not allowed in the ultrasound rooms during exams. Our front office staff cannot provide observation of children in our lobby area while testing is being conducted. An adult accompanying a patient to their appointment will only be allowed in the ultrasound room at the discretion of the ultrasound technician under special circumstances. We apologize for any inconvenience.   Follow-Up: At [facility], you and your health needs are our priority.  As part of our continuing mission to provide you with exceptional heart care, our providers are all part of one team.  This team includes your primary Cardiologist (physician)  and Advanced Practice Providers or APPs (Physician Assistants and Nurse Practitioners) who all work together to provide you with the care you need, when you need it.  Your next appointment:   1 year(s)  Provider:   Ozell Wonda, MD

## 2023-09-25 DIAGNOSIS — N2581 Secondary hyperparathyroidism of renal origin: Secondary | ICD-10-CM | POA: Diagnosis not present

## 2023-09-25 DIAGNOSIS — Z992 Dependence on renal dialysis: Secondary | ICD-10-CM | POA: Diagnosis not present

## 2023-09-25 DIAGNOSIS — N186 End stage renal disease: Secondary | ICD-10-CM | POA: Diagnosis not present

## 2023-09-26 ENCOUNTER — Other Ambulatory Visit: Payer: Self-pay

## 2023-09-26 ENCOUNTER — Ambulatory Visit: Admitting: Internal Medicine

## 2023-09-26 VITALS — BP 157/64 | HR 60 | Temp 97.6°F | Resp 16

## 2023-09-26 DIAGNOSIS — R7881 Bacteremia: Secondary | ICD-10-CM

## 2023-09-26 DIAGNOSIS — C44619 Basal cell carcinoma of skin of left upper limb, including shoulder: Secondary | ICD-10-CM | POA: Diagnosis not present

## 2023-09-26 DIAGNOSIS — B9561 Methicillin susceptible Staphylococcus aureus infection as the cause of diseases classified elsewhere: Secondary | ICD-10-CM | POA: Diagnosis not present

## 2023-09-26 NOTE — Progress Notes (Signed)
 Patient: Justin [name]  DOB: [birth year] MRN: [id number] PCP: Bernie Lamar PARAS, MD   Chief Complaint  Patient presents with   Follow-up    Bacteremia due to methicillin susceptible Staphylococcus aureus (MSSA)      Patient Active Problem List   Diagnosis Date Noted   Hardening of the aorta (main artery of the heart) 09/07/2023   Hypoalbuminemia 09/07/2023   Leukocytosis 09/07/2023   Occlusion and stenosis of bilateral carotid arteries 09/07/2023   Diabetes (HCC)    Gastroesophageal reflux disease with esophagitis without hemorrhage 07/27/2023   Intraspinal abscess and granuloma 07/26/2023   Bacteremia due to methicillin susceptible Staphylococcus aureus (MSSA) 07/19/2023   Hemorrhagic shock (HCC) 07/13/2023   Bilateral carotid artery stenosis 06/06/2023   Erosive esophagitis 06/06/2023   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) 06/06/2023   Vitamin B12 deficiency 06/06/2023   Dependence on renal dialysis 02/26/2023   Fall 02/26/2023   Gastroesophageal reflux disease without esophagitis 02/26/2023   Fever, unspecified 02/26/2023   Headache, unspecified 02/26/2023   Pure hypercholesterolemia 02/26/2023   Other fluid overload 02/26/2023   Esophagitis 02/25/2023   Insomnia due to medical condition 02/25/2023   Hiccups 02/24/2023   Acute postoperative anemia due to expected blood loss 02/23/2023   Globus sensation 02/23/2023   Delirium due to multiple etiologies 02/23/2023   S/P ORIF (open reduction internal fixation) fracture - right intertrochanteric fracture 02/17/2023   Closed comminuted intertrochanteric fracture of proximal end of femur with nonunion, right 02/16/2023   Type 2 diabetes mellitus with hyperglycemia (HCC) 02/16/2023   BPH (benign prostatic hyperplasia) 10/18/2020   Dysuria 10/18/2020   Pinna disorder, left 10/18/2020   Renal disorder 10/18/2020   Wears glasses 10/18/2020   Coronary artery disease involving native coronary artery  of native heart without angina pectoris 11/19/2019   Erectile dysfunction 11/19/2019   Hypertension 11/19/2019   History of chronic CHF 11/19/2019   ESRD on hemodialysis (HCC) - out-pt HD at [facility] on TTS 5:15 am chair time 11/19/2019   Personal history of COVID-19 11/19/2019   Other disorders of phosphorus metabolism 06/28/2018   Aortic valve disorder 05/07/2018   Mild protein-calorie malnutrition 05/02/2018   Anemia in chronic kidney disease 05/01/2018   Coagulation defect, unspecified 05/01/2018   Iron  deficiency anemia, unspecified 05/01/2018   Pruritus, unspecified 05/01/2018   Pain, unspecified 05/01/2018   Shortness of breath 05/01/2018   Secondary hyperparathyroidism of renal origin 05/01/2018   Nonrheumatic aortic valve stenosis    Chronic systolic CHF (congestive heart failure) (HCC) - now with recovered LVEF 04/24/2018   Hypertensive heart disease with congestive heart failure and stage 5 kidney disease (HCC) 04/24/2018   Type 2 diabetes mellitus with chronic kidney disease on chronic dialysis, with long-term current use of insulin  (HCC) 04/24/2018   Hyperlipemia 04/24/2018   Long term (current) use of insulin  (HCC) 04/24/2018   Lower urinary tract symptoms due to benign prostatic hyperplasia 05/20/2014     Subjective:  Justin [name] is a 73 year old male with past medical as below including history of ESRD on HD via left AV fistula complicated by recent hospitalization for hemorrhagic shock secondary to bleeding fistula during HD underwent fistulogram and angioplasty with IR on 7/14 was called back as blood cultures from hospitalization on 7/11 grew MSSA.  Presents for hospital follow-up of MSSA bacteremia with mets to lumbar spine likely secondary to left AV fistula.  On 7/11 1 out of 4 bottles grew MSSA on day 5,  blood cultures from 7/17 grew MSSA 1 out of 2 bottles, 1 out of 1 set.  MRI lumbar spine showed epidural fluid collection L3-L4, paraspinous abscess.   Neurosurgery was consulted recommended IR consult for aspiration.  Patient underwent aspiration with IR yielding 1 mL of purulent culture growing MSSA.  TTE and TEE without vegetation.  Patient discharged on cefazolin  with HD x 8 weeks EOT 9/12.   08/22/2023: no new complaints, other than fistula site with some blood at times. States nephrology aware. Pt states it was noted during HD session. He has gone back to regualr hd since missing a week. Bleeding form fistula site improved. T states he di dnot miss dyalis prior to ocmpleteint abx. He statees he had b.l le pain which caused him to missed hd. Noted pain rsolved on itis won.  ROS  Past Medical History:  Diagnosis Date   Acute postoperative anemia due to expected blood loss 02/23/2023   Anemia in chronic kidney disease 05/01/2018   Aortic valve disorder 05/07/2018   Bacteremia due to methicillin susceptible Staphylococcus aureus (MSSA) 07/19/2023   Bilateral carotid artery stenosis 06/06/2023   BPH (benign prostatic hyperplasia)    Chronic systolic CHF (congestive heart failure) (HCC) - now with recovered LVEF 04/24/2018   Closed comminuted intertrochanteric fracture of proximal end of femur with nonunion, right 02/16/2023   Coagulation defect, unspecified 05/01/2018   Coronary artery disease involving native coronary artery of native heart without angina pectoris 11/19/2019   LHC 09-29-2022     1.  Normal right heart hemodynamics with preserved cardiac output of 8.96 L/min and cardiac index of 4.5 L/min/m (likely high flow with hemodialysis/AV fistula)  2.  Moderate aortic stenosis with mean gradient 20 mmHg, calculated valve area 2.2 cm likely overestimated with high flow state.  Valve crossed with a J-wire.  3.  Patent left main with no stenosis  4.  Patent LAD with   Delirium due to multiple etiologies 02/23/2023   Dependence on renal dialysis 02/26/2023   Diabetes [facility])    Dysuria    Erectile dysfunction    Erosive esophagitis  06/06/2023   Esophagitis 02/25/2023   ESRD on hemodialysis [facility])    Fall 02/26/2023   Fever, unspecified 02/26/2023   Gastroesophageal reflux disease with esophagitis without hemorrhage 07/27/2023   Gastroesophageal reflux disease without esophagitis 02/26/2023   Globus sensation 02/23/2023   Hardening of the aorta (main artery of the heart) 09/07/2023   Headache, unspecified 02/26/2023   Hemorrhagic shock (HCC) 07/13/2023   Hiccups 02/24/2023   History of chronic CHF 11/19/2019   Hyperlipemia    Hypertension    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) 06/06/2023   Hypertensive heart disease with congestive heart failure and stage 5 kidney disease (HCC) 04/24/2018   Hypoalbuminemia 09/07/2023   Insomnia due to medical condition 02/25/2023   Intraspinal abscess and granuloma 07/26/2023   Iron  deficiency anemia, unspecified 05/01/2018   Leukocytosis 09/07/2023   Long term (current) use of insulin  (HCC) 04/24/2018   Lower urinary tract symptoms due to benign prostatic hyperplasia 05/20/2014   Mild protein-calorie malnutrition 05/02/2018   Nonrheumatic aortic valve stenosis    Occlusion and stenosis of bilateral carotid arteries 09/07/2023   Other disorders of phosphorus metabolism 06/28/2018   Other fluid overload 02/26/2023   Pain, unspecified 05/01/2018   Personal history of COVID-19 11/19/2019   Pinna disorder, left    Pruritus, unspecified 05/01/2018   Pure hypercholesterolemia 02/26/2023   Renal disorder 10/18/2020  S/P ORIF (open reduction internal fixation) fracture - right intertrochanteric fracture 02/17/2023   Secondary hyperparathyroidism of renal origin 05/01/2018   Shortness of breath 05/01/2018   Type 2 diabetes mellitus with chronic kidney disease on chronic dialysis, with long-term current use of insulin  (HCC) 04/24/2018   Type 2 diabetes mellitus with hyperglycemia (HCC) 02/16/2023   Vitamin B12 deficiency 06/06/2023   Wears glasses      Outpatient Medications Prior to Visit  Medication Sig Dispense Refill   acetaminophen  (TYLENOL ) 500 MG tablet Take 2 tablets (1,000 mg total) by mouth every 6 (six) hours as needed for mild pain (pain score 1-3), fever or headache.     amLODipine  (NORVASC ) 10 MG tablet Take 1 tablet (10 mg total) by mouth daily. 30 tablet 0   aspirin  EC 81 MG tablet Take 81 mg by mouth daily. Swallow whole.     cyanocobalamin  (VITAMIN B12) 1000 MCG tablet Take 1,000 mcg by mouth daily.     ferric citrate  (AURYXIA ) 1 GM 210 MG(Fe) tablet Take 630 mg by mouth 3 (three) times daily with meals.     hydrALAZINE  (APRESOLINE ) 25 MG tablet Take 1 tablet (25 mg total) by mouth 3 (three) times daily. Take 50 mg 3 times daily on M, W, F(dialysis days) Take 50 mg twice daily on Tu, Th, Sat, Sun(non-dialysis days) (Patient taking differently: Take 25 mg by mouth 3 (three) times daily. Taking 50 mg daily. (On dialysis days T., Th., Sat., taking 50 mg).)     insulin  aspart protamine  - aspart (NOVOLOG  70/30 FLEXPEN) (70-30) 100 UNIT/ML FlexPen Inject 20 Units into the skin at bedtime.     isosorbide  mononitrate (IMDUR ) 30 MG 24 hr tablet TAKE 1 TABLET BY MOUTH 4 TIMES WEEKLY, ON MONDAY, WEDNESDAY, FRIDAY, AND SUNDAY (NON-DIALYSIS DAYS) (Patient taking differently: 30 mg.) 64 tablet 3   metoprolol  succinate (TOPROL -XL) 50 MG 24 hr tablet Take 50 mg by mouth daily.     nitroGLYCERIN  (NITROSTAT ) 0.4 MG SL tablet Place 0.4 mg under the tongue every 5 (five) minutes as needed for chest pain.     pantoprazole  (PROTONIX ) 40 MG tablet Take 1 tablet (40 mg total) by mouth daily. 30 tablet 0   rosuvastatin  (CRESTOR ) 40 MG tablet Take 40 mg by mouth daily.     No facility-administered medications prior to visit.     No Known Allergies  Social History   Tobacco Use   Smoking status: Never   Smokeless tobacco: Never  Vaping Use   Vaping status: Never Used  Substance Use Topics   Alcohol  use: Not Currently   Drug use: Never     Family History  Problem Relation Age of Onset   Stroke Mother    Diabetes Father     Objective:   Vitals:   09/26/23 1515  BP: (!) 157/64  Pulse: 60  Resp: 16  Temp: 97.6 F (36.4 C)  TempSrc: Oral  SpO2: 98%   There is no height or weight on file to calculate BMI.  Physical Exam Constitutional:      General: He is not in acute distress.    Appearance: He is normal weight. He is not toxic-appearing.  HENT:     Head: Normocephalic and atraumatic.     Right Ear: External ear normal.     Left Ear: External ear normal.     Nose: No congestion or rhinorrhea.     Mouth/Throat:     Mouth: Mucous membranes are moist.     Pharynx:  Oropharynx is clear.  Eyes:     Extraocular Movements: Extraocular movements intact.     Conjunctiva/sclera: Conjunctivae normal.     Pupils: Pupils are equal, round, and reactive to light.  Cardiovascular:     Rate and Rhythm: Normal rate and regular rhythm.     Heart sounds: No murmur heard.    No friction rub. No gallop.  Pulmonary:     Effort: Pulmonary effort is normal.     Breath sounds: Normal breath sounds.  Abdominal:     General: Abdomen is flat. Bowel sounds are normal.     Palpations: Abdomen is soft.  Musculoskeletal:        General: No swelling.     Cervical back: Normal range of motion and neck supple.  Skin:    General: Skin is warm and dry.  Neurological:     General: No focal deficit present.     Mental Status: He is oriented to person, place, and time.  Psychiatric:        Mood and Affect: Mood normal.     Lab Results: Lab Results  Component Value Date   WBC 8.5 07/25/2023   HGB 7.9 (L) 07/25/2023   HCT 24.2 (L) 07/25/2023   MCV 89.0 07/25/2023   PLT 396 07/25/2023    Lab Results  Component Value Date   CREATININE 6.40 (H) 07/25/2023   BUN 26 (H) 07/25/2023   NA 133 (L) 07/25/2023   K 4.4 07/25/2023   CL 94 (L) 07/25/2023   CO2 27 07/25/2023    Lab Results  Component Value Date   ALT 4 (L)  08/22/2023   AST 45 (H) 08/22/2023   ALKPHOS 88 07/19/2023   BILITOT 0.6 08/22/2023     Assessment & Plan:  #MSSA bacteremia with metastasis to lumbar spine likely secondary to left AVF #ESRD on HD #CAD #DM -Patient has initial hospitalization on 7/11 due to hemorrhagic shock secondary to bleeding fistula during HD underwent fistulogram angioplasty with IR on 7/14 was called out back as blood cultures on day 5 grew 1 out of 4 bottles MSSA. - 7/17 blood cultures grew 1 out of 1 set, in aerobic bottles only MSSA, repeat blood culture 7/8 18 no growth - CT showed epidural fluid around the L3-L4 spine concerning for abscess/phlegmon, paraspinous abscesses as well.  IR aspirated lumbar paraspinal fluid which grew MSSA as well. - TTE and TEE without vegetation. - Patient discharged on cefazolin  x 8 weeks with HD EOT 9/12. Ok to stop at 9/12.  Communicated with Dr [facility], stent in place, no graft.  -9/11 labs wbc 7. In the interim: pt had le pain and subsequently was not going to hd x 1 month. He is now back with HD and noted  bleeding wth fistula improved. Denies fevers and chills.  Plan Labs today off of abx F/u prn  [name], MD [facility]   09/26/23  3:29 PM   I have personally spent 42 minutes involved in face-to-face and non-face-to-face activities for this patient on the day of the visit. Professional time spent includes the following activities: Preparing to see the patient (review of tests), Obtaining and/or reviewing separately obtained history (admission/discharge record), Performing a medically appropriate examination and/or evaluation , Ordering medications/tests/procedures, referring and communicating with other health care professionals, Documenting clinical information in the EMR, Independently interpreting results (not separately reported), Communicating results to the patient/family/caregiver, Counseling and  educating the patient/family/caregiver and Care  coordination (not separately reported).

## 2023-09-27 DIAGNOSIS — Z992 Dependence on renal dialysis: Secondary | ICD-10-CM | POA: Diagnosis not present

## 2023-09-27 DIAGNOSIS — N2581 Secondary hyperparathyroidism of renal origin: Secondary | ICD-10-CM | POA: Diagnosis not present

## 2023-09-27 DIAGNOSIS — N186 End stage renal disease: Secondary | ICD-10-CM | POA: Diagnosis not present

## 2023-09-29 DIAGNOSIS — N186 End stage renal disease: Secondary | ICD-10-CM | POA: Diagnosis not present

## 2023-09-29 DIAGNOSIS — N2581 Secondary hyperparathyroidism of renal origin: Secondary | ICD-10-CM | POA: Diagnosis not present

## 2023-09-29 DIAGNOSIS — Z992 Dependence on renal dialysis: Secondary | ICD-10-CM | POA: Diagnosis not present

## 2023-10-02 DIAGNOSIS — N186 End stage renal disease: Secondary | ICD-10-CM | POA: Diagnosis not present

## 2023-10-02 DIAGNOSIS — E1129 Type 2 diabetes mellitus with other diabetic kidney complication: Secondary | ICD-10-CM | POA: Diagnosis not present

## 2023-10-02 DIAGNOSIS — Z992 Dependence on renal dialysis: Secondary | ICD-10-CM | POA: Diagnosis not present

## 2023-10-02 LAB — CULTURE, BLOOD (SINGLE)
MICRO NUMBER:: 17013662
MICRO NUMBER:: 17013699
Result:: NO GROWTH
Result:: NO GROWTH
SPECIMEN QUALITY:: ADEQUATE
SPECIMEN QUALITY:: ADEQUATE

## 2023-10-06 DIAGNOSIS — N2581 Secondary hyperparathyroidism of renal origin: Secondary | ICD-10-CM | POA: Diagnosis not present

## 2023-10-06 DIAGNOSIS — Z992 Dependence on renal dialysis: Secondary | ICD-10-CM | POA: Diagnosis not present

## 2023-10-09 DIAGNOSIS — N186 End stage renal disease: Secondary | ICD-10-CM | POA: Diagnosis not present

## 2023-10-13 DIAGNOSIS — N2581 Secondary hyperparathyroidism of renal origin: Secondary | ICD-10-CM | POA: Diagnosis not present

## 2023-10-13 DIAGNOSIS — Z992 Dependence on renal dialysis: Secondary | ICD-10-CM | POA: Diagnosis not present

## 2023-10-13 DIAGNOSIS — N186 End stage renal disease: Secondary | ICD-10-CM | POA: Diagnosis not present

## 2023-10-15 DIAGNOSIS — G062 Extradural and subdural abscess, unspecified: Secondary | ICD-10-CM | POA: Diagnosis not present

## 2023-10-15 DIAGNOSIS — K2210 Ulcer of esophagus without bleeding: Secondary | ICD-10-CM | POA: Diagnosis not present

## 2023-10-15 DIAGNOSIS — G479 Sleep disorder, unspecified: Secondary | ICD-10-CM | POA: Diagnosis not present

## 2023-10-15 DIAGNOSIS — B9562 Methicillin resistant Staphylococcus aureus infection as the cause of diseases classified elsewhere: Secondary | ICD-10-CM | POA: Diagnosis not present

## 2023-10-15 DIAGNOSIS — E1122 Type 2 diabetes mellitus with diabetic chronic kidney disease: Secondary | ICD-10-CM | POA: Diagnosis not present

## 2023-10-15 DIAGNOSIS — I509 Heart failure, unspecified: Secondary | ICD-10-CM | POA: Diagnosis not present

## 2023-10-15 DIAGNOSIS — I132 Hypertensive heart and chronic kidney disease with heart failure and with stage 5 chronic kidney disease, or end stage renal disease: Secondary | ICD-10-CM | POA: Diagnosis not present

## 2023-10-15 DIAGNOSIS — R7881 Bacteremia: Secondary | ICD-10-CM | POA: Diagnosis not present

## 2023-10-15 DIAGNOSIS — N186 End stage renal disease: Secondary | ICD-10-CM | POA: Diagnosis not present

## 2023-10-18 DIAGNOSIS — N2581 Secondary hyperparathyroidism of renal origin: Secondary | ICD-10-CM | POA: Diagnosis not present

## 2023-10-20 DIAGNOSIS — Z992 Dependence on renal dialysis: Secondary | ICD-10-CM | POA: Diagnosis not present

## 2023-10-20 DIAGNOSIS — N2581 Secondary hyperparathyroidism of renal origin: Secondary | ICD-10-CM | POA: Diagnosis not present

## 2023-10-24 DIAGNOSIS — G062 Extradural and subdural abscess, unspecified: Secondary | ICD-10-CM | POA: Diagnosis not present

## 2023-10-24 DIAGNOSIS — B9562 Methicillin resistant Staphylococcus aureus infection as the cause of diseases classified elsewhere: Secondary | ICD-10-CM | POA: Diagnosis not present

## 2023-10-24 DIAGNOSIS — G479 Sleep disorder, unspecified: Secondary | ICD-10-CM | POA: Diagnosis not present

## 2023-10-24 DIAGNOSIS — I509 Heart failure, unspecified: Secondary | ICD-10-CM | POA: Diagnosis not present

## 2023-10-24 DIAGNOSIS — K2210 Ulcer of esophagus without bleeding: Secondary | ICD-10-CM | POA: Diagnosis not present

## 2023-10-24 DIAGNOSIS — I132 Hypertensive heart and chronic kidney disease with heart failure and with stage 5 chronic kidney disease, or end stage renal disease: Secondary | ICD-10-CM | POA: Diagnosis not present

## 2023-10-24 DIAGNOSIS — N186 End stage renal disease: Secondary | ICD-10-CM | POA: Diagnosis not present

## 2023-10-24 DIAGNOSIS — R7881 Bacteremia: Secondary | ICD-10-CM | POA: Diagnosis not present

## 2023-10-27 DIAGNOSIS — N2581 Secondary hyperparathyroidism of renal origin: Secondary | ICD-10-CM | POA: Diagnosis not present

## 2023-10-27 DIAGNOSIS — Z992 Dependence on renal dialysis: Secondary | ICD-10-CM | POA: Diagnosis not present

## 2023-10-27 DIAGNOSIS — N186 End stage renal disease: Secondary | ICD-10-CM | POA: Diagnosis not present

## 2023-10-30 DIAGNOSIS — I509 Heart failure, unspecified: Secondary | ICD-10-CM | POA: Diagnosis not present

## 2023-10-30 DIAGNOSIS — N2581 Secondary hyperparathyroidism of renal origin: Secondary | ICD-10-CM | POA: Diagnosis not present

## 2023-10-30 DIAGNOSIS — G062 Extradural and subdural abscess, unspecified: Secondary | ICD-10-CM | POA: Diagnosis not present

## 2023-10-30 DIAGNOSIS — R7881 Bacteremia: Secondary | ICD-10-CM | POA: Diagnosis not present

## 2023-10-30 DIAGNOSIS — I132 Hypertensive heart and chronic kidney disease with heart failure and with stage 5 chronic kidney disease, or end stage renal disease: Secondary | ICD-10-CM | POA: Diagnosis not present

## 2023-10-30 DIAGNOSIS — Z992 Dependence on renal dialysis: Secondary | ICD-10-CM | POA: Diagnosis not present

## 2023-10-30 DIAGNOSIS — E1122 Type 2 diabetes mellitus with diabetic chronic kidney disease: Secondary | ICD-10-CM | POA: Diagnosis not present

## 2023-10-30 DIAGNOSIS — N186 End stage renal disease: Secondary | ICD-10-CM | POA: Diagnosis not present

## 2023-10-30 DIAGNOSIS — Z794 Long term (current) use of insulin: Secondary | ICD-10-CM | POA: Diagnosis not present

## 2023-10-31 DIAGNOSIS — Z992 Dependence on renal dialysis: Secondary | ICD-10-CM | POA: Diagnosis not present

## 2023-10-31 DIAGNOSIS — B9561 Methicillin susceptible Staphylococcus aureus infection as the cause of diseases classified elsewhere: Secondary | ICD-10-CM | POA: Diagnosis not present

## 2023-10-31 DIAGNOSIS — Z794 Long term (current) use of insulin: Secondary | ICD-10-CM | POA: Diagnosis not present

## 2023-10-31 DIAGNOSIS — I120 Hypertensive chronic kidney disease with stage 5 chronic kidney disease or end stage renal disease: Secondary | ICD-10-CM | POA: Diagnosis not present

## 2023-10-31 DIAGNOSIS — E1122 Type 2 diabetes mellitus with diabetic chronic kidney disease: Secondary | ICD-10-CM | POA: Diagnosis not present

## 2023-10-31 DIAGNOSIS — G062 Extradural and subdural abscess, unspecified: Secondary | ICD-10-CM | POA: Diagnosis not present

## 2023-10-31 DIAGNOSIS — I2510 Atherosclerotic heart disease of native coronary artery without angina pectoris: Secondary | ICD-10-CM | POA: Diagnosis not present

## 2023-10-31 DIAGNOSIS — N186 End stage renal disease: Secondary | ICD-10-CM | POA: Diagnosis not present

## 2023-11-02 DIAGNOSIS — G062 Extradural and subdural abscess, unspecified: Secondary | ICD-10-CM | POA: Diagnosis not present

## 2023-11-02 DIAGNOSIS — R7881 Bacteremia: Secondary | ICD-10-CM | POA: Diagnosis not present

## 2023-11-02 DIAGNOSIS — E1122 Type 2 diabetes mellitus with diabetic chronic kidney disease: Secondary | ICD-10-CM | POA: Diagnosis not present

## 2023-11-02 DIAGNOSIS — B9562 Methicillin resistant Staphylococcus aureus infection as the cause of diseases classified elsewhere: Secondary | ICD-10-CM | POA: Diagnosis not present

## 2023-11-02 DIAGNOSIS — K2210 Ulcer of esophagus without bleeding: Secondary | ICD-10-CM | POA: Diagnosis not present

## 2023-11-02 DIAGNOSIS — Z992 Dependence on renal dialysis: Secondary | ICD-10-CM | POA: Diagnosis not present

## 2023-11-02 DIAGNOSIS — I509 Heart failure, unspecified: Secondary | ICD-10-CM | POA: Diagnosis not present

## 2023-11-02 DIAGNOSIS — N186 End stage renal disease: Secondary | ICD-10-CM | POA: Diagnosis not present

## 2023-11-02 DIAGNOSIS — G479 Sleep disorder, unspecified: Secondary | ICD-10-CM | POA: Diagnosis not present

## 2023-11-02 DIAGNOSIS — E1129 Type 2 diabetes mellitus with other diabetic kidney complication: Secondary | ICD-10-CM | POA: Diagnosis not present

## 2023-11-02 DIAGNOSIS — I132 Hypertensive heart and chronic kidney disease with heart failure and with stage 5 chronic kidney disease, or end stage renal disease: Secondary | ICD-10-CM | POA: Diagnosis not present

## 2023-11-03 DIAGNOSIS — N186 End stage renal disease: Secondary | ICD-10-CM | POA: Diagnosis not present

## 2023-11-03 DIAGNOSIS — Z992 Dependence on renal dialysis: Secondary | ICD-10-CM | POA: Diagnosis not present

## 2023-11-03 DIAGNOSIS — N2581 Secondary hyperparathyroidism of renal origin: Secondary | ICD-10-CM | POA: Diagnosis not present

## 2023-11-06 DIAGNOSIS — N2581 Secondary hyperparathyroidism of renal origin: Secondary | ICD-10-CM | POA: Diagnosis not present

## 2023-11-06 DIAGNOSIS — Z992 Dependence on renal dialysis: Secondary | ICD-10-CM | POA: Diagnosis not present

## 2023-11-08 DIAGNOSIS — N186 End stage renal disease: Secondary | ICD-10-CM | POA: Diagnosis not present

## 2023-11-08 DIAGNOSIS — N2581 Secondary hyperparathyroidism of renal origin: Secondary | ICD-10-CM | POA: Diagnosis not present

## 2023-11-08 DIAGNOSIS — Z992 Dependence on renal dialysis: Secondary | ICD-10-CM | POA: Diagnosis not present

## 2023-11-09 DIAGNOSIS — B9562 Methicillin resistant Staphylococcus aureus infection as the cause of diseases classified elsewhere: Secondary | ICD-10-CM | POA: Diagnosis not present

## 2023-11-09 DIAGNOSIS — G479 Sleep disorder, unspecified: Secondary | ICD-10-CM | POA: Diagnosis not present

## 2023-11-09 DIAGNOSIS — R7881 Bacteremia: Secondary | ICD-10-CM | POA: Diagnosis not present

## 2023-11-09 DIAGNOSIS — K2210 Ulcer of esophagus without bleeding: Secondary | ICD-10-CM | POA: Diagnosis not present

## 2023-11-09 DIAGNOSIS — I509 Heart failure, unspecified: Secondary | ICD-10-CM | POA: Diagnosis not present

## 2023-11-09 DIAGNOSIS — N186 End stage renal disease: Secondary | ICD-10-CM | POA: Diagnosis not present

## 2023-11-09 DIAGNOSIS — I132 Hypertensive heart and chronic kidney disease with heart failure and with stage 5 chronic kidney disease, or end stage renal disease: Secondary | ICD-10-CM | POA: Diagnosis not present

## 2023-11-09 DIAGNOSIS — G062 Extradural and subdural abscess, unspecified: Secondary | ICD-10-CM | POA: Diagnosis not present

## 2023-11-10 DIAGNOSIS — Z992 Dependence on renal dialysis: Secondary | ICD-10-CM | POA: Diagnosis not present

## 2023-11-10 DIAGNOSIS — N2581 Secondary hyperparathyroidism of renal origin: Secondary | ICD-10-CM | POA: Diagnosis not present

## 2023-11-13 DIAGNOSIS — N186 End stage renal disease: Secondary | ICD-10-CM | POA: Diagnosis not present

## 2023-11-13 DIAGNOSIS — Z992 Dependence on renal dialysis: Secondary | ICD-10-CM | POA: Diagnosis not present

## 2023-11-13 DIAGNOSIS — N2581 Secondary hyperparathyroidism of renal origin: Secondary | ICD-10-CM | POA: Diagnosis not present

## 2023-11-16 DIAGNOSIS — G062 Extradural and subdural abscess, unspecified: Secondary | ICD-10-CM | POA: Diagnosis not present

## 2023-11-16 DIAGNOSIS — N186 End stage renal disease: Secondary | ICD-10-CM | POA: Diagnosis not present

## 2023-11-16 DIAGNOSIS — K2210 Ulcer of esophagus without bleeding: Secondary | ICD-10-CM | POA: Diagnosis not present

## 2023-11-16 DIAGNOSIS — R7881 Bacteremia: Secondary | ICD-10-CM | POA: Diagnosis not present

## 2023-11-16 DIAGNOSIS — B9562 Methicillin resistant Staphylococcus aureus infection as the cause of diseases classified elsewhere: Secondary | ICD-10-CM | POA: Diagnosis not present

## 2023-11-16 DIAGNOSIS — G479 Sleep disorder, unspecified: Secondary | ICD-10-CM | POA: Diagnosis not present

## 2023-11-16 DIAGNOSIS — I509 Heart failure, unspecified: Secondary | ICD-10-CM | POA: Diagnosis not present

## 2023-11-16 DIAGNOSIS — I132 Hypertensive heart and chronic kidney disease with heart failure and with stage 5 chronic kidney disease, or end stage renal disease: Secondary | ICD-10-CM | POA: Diagnosis not present

## 2023-11-16 DIAGNOSIS — E1122 Type 2 diabetes mellitus with diabetic chronic kidney disease: Secondary | ICD-10-CM | POA: Diagnosis not present

## 2023-11-17 DIAGNOSIS — N2581 Secondary hyperparathyroidism of renal origin: Secondary | ICD-10-CM | POA: Diagnosis not present

## 2023-11-17 DIAGNOSIS — Z992 Dependence on renal dialysis: Secondary | ICD-10-CM | POA: Diagnosis not present

## 2023-11-19 DIAGNOSIS — G062 Extradural and subdural abscess, unspecified: Secondary | ICD-10-CM | POA: Diagnosis not present

## 2023-11-19 DIAGNOSIS — I509 Heart failure, unspecified: Secondary | ICD-10-CM | POA: Diagnosis not present

## 2023-11-19 DIAGNOSIS — N186 End stage renal disease: Secondary | ICD-10-CM | POA: Diagnosis not present

## 2023-11-19 DIAGNOSIS — B9562 Methicillin resistant Staphylococcus aureus infection as the cause of diseases classified elsewhere: Secondary | ICD-10-CM | POA: Diagnosis not present

## 2023-11-19 DIAGNOSIS — R7881 Bacteremia: Secondary | ICD-10-CM | POA: Diagnosis not present

## 2023-11-19 DIAGNOSIS — E1122 Type 2 diabetes mellitus with diabetic chronic kidney disease: Secondary | ICD-10-CM | POA: Diagnosis not present

## 2023-11-19 DIAGNOSIS — K2210 Ulcer of esophagus without bleeding: Secondary | ICD-10-CM | POA: Diagnosis not present

## 2023-11-19 DIAGNOSIS — I132 Hypertensive heart and chronic kidney disease with heart failure and with stage 5 chronic kidney disease, or end stage renal disease: Secondary | ICD-10-CM | POA: Diagnosis not present

## 2023-11-21 DIAGNOSIS — E119 Type 2 diabetes mellitus without complications: Secondary | ICD-10-CM | POA: Diagnosis not present

## 2023-11-21 DIAGNOSIS — H5231 Anisometropia: Secondary | ICD-10-CM | POA: Diagnosis not present

## 2023-11-21 DIAGNOSIS — H524 Presbyopia: Secondary | ICD-10-CM | POA: Diagnosis not present

## 2023-11-21 DIAGNOSIS — H25813 Combined forms of age-related cataract, bilateral: Secondary | ICD-10-CM | POA: Diagnosis not present

## 2023-11-21 DIAGNOSIS — H52223 Regular astigmatism, bilateral: Secondary | ICD-10-CM | POA: Diagnosis not present

## 2023-11-21 DIAGNOSIS — Z0100 Encounter for examination of eyes and vision without abnormal findings: Secondary | ICD-10-CM | POA: Diagnosis not present

## 2023-11-21 DIAGNOSIS — Z794 Long term (current) use of insulin: Secondary | ICD-10-CM | POA: Diagnosis not present

## 2023-11-24 DIAGNOSIS — Z992 Dependence on renal dialysis: Secondary | ICD-10-CM | POA: Diagnosis not present

## 2023-11-24 DIAGNOSIS — N2581 Secondary hyperparathyroidism of renal origin: Secondary | ICD-10-CM | POA: Diagnosis not present

## 2023-11-26 DIAGNOSIS — N2581 Secondary hyperparathyroidism of renal origin: Secondary | ICD-10-CM | POA: Diagnosis not present

## 2023-11-26 DIAGNOSIS — Z992 Dependence on renal dialysis: Secondary | ICD-10-CM | POA: Diagnosis not present

## 2023-12-01 DIAGNOSIS — Z992 Dependence on renal dialysis: Secondary | ICD-10-CM | POA: Diagnosis not present

## 2023-12-01 DIAGNOSIS — N186 End stage renal disease: Secondary | ICD-10-CM | POA: Diagnosis not present

## 2023-12-01 DIAGNOSIS — N2581 Secondary hyperparathyroidism of renal origin: Secondary | ICD-10-CM | POA: Diagnosis not present

## 2023-12-02 DIAGNOSIS — E1129 Type 2 diabetes mellitus with other diabetic kidney complication: Secondary | ICD-10-CM | POA: Diagnosis not present

## 2023-12-02 DIAGNOSIS — Z992 Dependence on renal dialysis: Secondary | ICD-10-CM | POA: Diagnosis not present

## 2023-12-02 DIAGNOSIS — N186 End stage renal disease: Secondary | ICD-10-CM | POA: Diagnosis not present

## 2023-12-07 DIAGNOSIS — G062 Extradural and subdural abscess, unspecified: Secondary | ICD-10-CM | POA: Diagnosis not present

## 2023-12-12 ENCOUNTER — Ambulatory Visit: Attending: Cardiology | Admitting: Cardiology

## 2023-12-12 ENCOUNTER — Encounter: Payer: Self-pay | Admitting: Cardiology

## 2023-12-12 VITALS — BP 128/56 | HR 64 | Ht 69.0 in | Wt 169.2 lb

## 2023-12-12 DIAGNOSIS — I350 Nonrheumatic aortic (valve) stenosis: Secondary | ICD-10-CM | POA: Diagnosis not present

## 2023-12-12 DIAGNOSIS — I2510 Atherosclerotic heart disease of native coronary artery without angina pectoris: Secondary | ICD-10-CM

## 2023-12-12 DIAGNOSIS — E1122 Type 2 diabetes mellitus with diabetic chronic kidney disease: Secondary | ICD-10-CM

## 2023-12-12 DIAGNOSIS — Z992 Dependence on renal dialysis: Secondary | ICD-10-CM

## 2023-12-12 DIAGNOSIS — Z794 Long term (current) use of insulin: Secondary | ICD-10-CM

## 2023-12-12 DIAGNOSIS — R0609 Other forms of dyspnea: Secondary | ICD-10-CM

## 2023-12-12 DIAGNOSIS — N186 End stage renal disease: Secondary | ICD-10-CM

## 2023-12-12 NOTE — Progress Notes (Unsigned)
 Cardiology Office Note:    Date:  12/12/2023   ID:  Justin [name], Justin [name] December 20, 1950, MRN [id number]  PCP:  Bernie Lamar PARAS, MD  Cardiologist:  Lamar Bernie, MD    Referring MD: Bernie Lamar PARAS, MD   No chief complaint on file. Doing fine.  History of Present Illness:    Justin [name] is a 73 y.o. male  past medical history significant for end-stage kidney disease he is on dialysis he has been on dialysis for about 4 years, coronary artery disease cardiac catheterization done just recently show moderate stenosis of the LAD, also 70% of diagonal branch, 90% of small circumflex, 90% PDA. The reason for cardiac catheterization was also to assess his degree of aortic stenosis which appears to be moderate, what prompted investigation to begin with was the fact that he was potential candidate for kidney transplant. Since have seen him last time significant development happened. In July he ended up having hemorrhagic shock due to bleeding fistula from dialysis. He did have fistulogram and angioplasty done on however in the hospital he was also found to have MRSA positive in his blood. Additional issue left-sided epidural collection of fluid L3-L4 that required aspiration of the left lumbar paraspinal abscess on 718. At that time he got EGD done which showed no significant pathology and then had transesophageal echocardiogram done which was likely negative for endocarditis. Partial assessment of the aortic valve described as moderate. However the main reason for doing TEE was to rule out endocarditis. Family thinks that subsided he looks much better he feels much better. Denies have any pain tightness squeezing pressure burning chest. He comes here to talk about aortic stenosis which appears to be significant. Echocardiogram from July of this year showed calculated aortic valve area 0.8, dimension index 0.23, peak gradient 41 mean 23.8, stroke-volume index was 33. Heart rate we dealing with low-flow  low gradient significant aortic stenosis . Comes today to my office for follow-up since I seen him last time he did see our structural heart team for consideration of aortic valve stenosis, feeling well is that this is moderate stenosis.  Overall he is doing very well he can walk climb stairs does have some difficulty walking because of hip surgery and he did quite walk with a cane but still very busy working with the livestock.  Denies have any chest pain tightness squeezing pressure burning chest no palpitation dizziness passing out.  Past Medical History:  Diagnosis Date   Acute postoperative anemia due to expected blood loss 02/23/2023   Anemia in chronic kidney disease 05/01/2018   Aortic valve disorder 05/07/2018   Bacteremia due to methicillin susceptible Staphylococcus aureus (MSSA) 07/19/2023   Bilateral carotid artery stenosis 06/06/2023   BPH (benign prostatic hyperplasia)    Chronic systolic CHF (congestive heart failure) (HCC) - now with recovered LVEF 04/24/2018   Closed comminuted intertrochanteric fracture of proximal end of femur with nonunion, right 02/16/2023   Coagulation defect, unspecified 05/01/2018   Coronary artery disease involving native coronary artery of native heart without angina pectoris 11/19/2019   LHC 09-29-2022     1.  Normal right heart hemodynamics with preserved cardiac output of 8.96 L/min and cardiac index of 4.5 L/min/m (likely high flow with hemodialysis/AV fistula)  2.  Moderate aortic stenosis with mean gradient 20 mmHg, calculated valve area 2.2 cm likely overestimated with high flow state.  Valve crossed with a J-wire.  3.  Patent left main with no stenosis  4.  Patent LAD with   Delirium due to multiple etiologies 02/23/2023   Dependence on renal dialysis 02/26/2023   Diabetes [facility])    Dysuria    Erectile dysfunction    Erosive esophagitis 06/06/2023   Esophagitis 02/25/2023   ESRD on hemodialysis [facility])    Fall 02/26/2023   Fever, unspecified  02/26/2023   Gastroesophageal reflux disease with esophagitis without hemorrhage 07/27/2023   Gastroesophageal reflux disease without esophagitis 02/26/2023   Globus sensation 02/23/2023   Hardening of the aorta (main artery of the heart) 09/07/2023   Headache, unspecified 02/26/2023   Hemorrhagic shock (HCC) 07/13/2023   Hiccups 02/24/2023   History of chronic CHF 11/19/2019   Hyperlipemia    Hypertension    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) 06/06/2023   Hypertensive heart disease with congestive heart failure and stage 5 kidney disease (HCC) 04/24/2018   Hypoalbuminemia 09/07/2023   Insomnia due to medical condition 02/25/2023   Intraspinal abscess and granuloma 07/26/2023   Iron  deficiency anemia, unspecified 05/01/2018   Leukocytosis 09/07/2023   Long term (current) use of insulin  (HCC) 04/24/2018   Lower urinary tract symptoms due to benign prostatic hyperplasia 05/20/2014   Mild protein-calorie malnutrition 05/02/2018   Nonrheumatic aortic valve stenosis    Occlusion and stenosis of bilateral carotid arteries 09/07/2023   Other disorders of phosphorus metabolism 06/28/2018   Other fluid overload 02/26/2023   Pain, unspecified 05/01/2018   Personal history of COVID-19 11/19/2019   Pinna disorder, left    Pruritus, unspecified 05/01/2018   Pure hypercholesterolemia 02/26/2023   Renal disorder 10/18/2020   S/P ORIF (open reduction internal fixation) fracture - right intertrochanteric fracture 02/17/2023   Secondary hyperparathyroidism of renal origin 05/01/2018   Shortness of breath 05/01/2018   Type 2 diabetes mellitus with chronic kidney disease on chronic dialysis, with long-term current use of insulin  (HCC) 04/24/2018   Type 2 diabetes mellitus with hyperglycemia (HCC) 02/16/2023   Vitamin B12 deficiency 06/06/2023   Wears glasses     Past Surgical History:  Procedure Laterality Date   A/V FISTULAGRAM Left 07/16/2023   Procedure: A/V  Fistulagram;  Surgeon: Serene Gaile ORN, MD;  Location: [facility] INVASIVE CV LAB;  Service: Cardiovascular;  Laterality: Left;   A/V SHUNT INTERVENTION N/A 04/16/2023   Procedure: A/V SHUNT INTERVENTION;  Surgeon: Norine Manuelita LABOR, MD;  Location: [facility] INVASIVE CV LAB;  Service: Cardiovascular;  Laterality: N/A;   AV FISTULA PLACEMENT Left 04/30/2018   Procedure: CREATION OF RADIOCEPHALIC ARTERIOVENOUS FISTULA LEFT ARM;  Surgeon: Harvey Carlin BRAVO, MD;  Location: [facility] OR;  Service: Vascular;  Laterality: Left;   AV FISTULA PLACEMENT Left 08/28/2018   Procedure: ARTERIOVENOUS (AV) BRACHIOCEPHALIC FISTULA CREATION LEFT UPPER ARM;  Surgeon: Gretta Lonni PARAS, MD;  Location: [facility] OR;  Service: Vascular;  Laterality: Left;   BIOPSY  02/24/2023   Procedure: BIOPSY;  Surgeon: Federico Rosario BROCKS, MD;  Location: [facility] ENDOSCOPY;  Service: Gastroenterology;;   ESOPHAGOGASTRODUODENOSCOPY N/A 07/23/2023   Procedure: EGD (ESOPHAGOGASTRODUODENOSCOPY);  Surgeon: Nandigam, Kavitha V, MD;  Location: [facility] ENDOSCOPY;  Service: Gastroenterology;  Laterality: N/A;   ESOPHAGOGASTRODUODENOSCOPY (EGD) WITH PROPOFOL  N/A 02/24/2023   Procedure: ESOPHAGOGASTRODUODENOSCOPY (EGD) WITH PROPOFOL ;  Surgeon: Federico Rosario BROCKS, MD;  Location: [facility] ENDOSCOPY;  Service: Gastroenterology;  Laterality: N/A;  Patint is s/p right hip ORIF   INTRAMEDULLARY (IM) NAIL INTERTROCHANTERIC Right 02/17/2023   Procedure: INTRAMEDULLARY (IM) NAIL INTERTROCHANTERIC;  Surgeon: Sherida Adine BROCKS, MD;  Location: [facility] OR;  Service: Orthopedics;  Laterality: Right;  IR FLUORO GUIDE CV LINE RIGHT  04/29/2018   IR FLUORO GUIDED NEEDLE PLC ASPIRATION/INJECTION LOC  07/20/2023   IR US  GUIDE VASC ACCESS RIGHT  04/29/2018   RIGHT/LEFT HEART CATH AND CORONARY ANGIOGRAPHY N/A 05/01/2018   Procedure: RIGHT/LEFT HEART CATH AND CORONARY ANGIOGRAPHY;  Surgeon: Claudene Victory ORN, MD;  Location: [facility] LAB;  Service: Cardiovascular;  Laterality: N/A;   RIGHT/LEFT HEART CATH AND CORONARY  ANGIOGRAPHY N/A 09/29/2022   Procedure: RIGHT/LEFT HEART CATH AND CORONARY ANGIOGRAPHY;  Surgeon: Wonda Sharper, MD;  Location: [facility] INVASIVE CV LAB;  Service: Cardiovascular;  Laterality: N/A;   subdural hematoma Right 07/2020   TRANSESOPHAGEAL ECHOCARDIOGRAM (CATH LAB) N/A 07/25/2023   Procedure: TRANSESOPHAGEAL ECHOCARDIOGRAM;  Surgeon: Jeffrie Oneil BROCKS, MD;  Location: [facility] CV LAB;  Service: Cardiovascular;  Laterality: N/A;    Current Medications: Current Meds  Medication Sig   acetaminophen  (TYLENOL ) 500 MG tablet Take 2 tablets (1,000 mg total) by mouth every 6 (six) hours as needed for mild pain (pain score 1-3), fever or headache.   amLODipine  (NORVASC ) 10 MG tablet Take 1 tablet (10 mg total) by mouth daily.   aspirin  EC 81 MG tablet Take 81 mg by mouth daily. Swallow whole.   cyanocobalamin  (VITAMIN B12) 1000 MCG tablet Take 1,000 mcg by mouth 3 (three) times a week.   ferric citrate  (AURYXIA ) 1 GM 210 MG(Fe) tablet Take 630 mg by mouth 3 (three) times daily with meals.   hydrALAZINE  (APRESOLINE ) 25 MG tablet Take 1 tablet (25 mg total) by mouth 3 (three) times daily. Take 50 mg 3 times daily on M, W, F(dialysis days) Take 50 mg twice daily on Tu, Th, Sat, Sun(non-dialysis days) (Patient taking differently: Take 25 mg by mouth 3 (three) times daily. Taking 50 mg daily. (On dialysis days T., Th., Sat., taking 50 mg).)   insulin  aspart protamine  - aspart (NOVOLOG  70/30 FLEXPEN) (70-30) 100 UNIT/ML FlexPen Inject 20 Units into the skin at bedtime.   isosorbide  mononitrate (IMDUR ) 30 MG 24 hr tablet TAKE 1 TABLET BY MOUTH 4 TIMES WEEKLY, ON MONDAY, WEDNESDAY, FRIDAY, AND SUNDAY (NON-DIALYSIS DAYS)   metoprolol  succinate (TOPROL -XL) 50 MG 24 hr tablet Take 50 mg by mouth daily.   nitroGLYCERIN  (NITROSTAT ) 0.4 MG SL tablet Place 0.4 mg under the tongue every 5 (five) minutes as needed for chest pain.   pantoprazole  (PROTONIX ) 40 MG tablet Take 1 tablet (40 mg total) by mouth daily.    rosuvastatin  (CRESTOR ) 40 MG tablet Take 40 mg by mouth daily.     Allergies:   Patient has no known allergies.   Social History   Socioeconomic History   Marital status: Divorced    Spouse name: Not on file   Number of children: 1   Years of education: Not on file   Highest education level: Not on file  Occupational History   Not on file  Tobacco Use   Smoking status: Never   Smokeless tobacco: Never  Vaping Use   Vaping status: Never Used  Substance and Sexual Activity   Alcohol  use: Not Currently   Drug use: Never   Sexual activity: Not Currently  Other Topics Concern   Not on file  Social History Narrative   Not on file   Social Drivers of Health   Financial Resource Strain: Low Risk (07/13/2020)   Received from [facility]   Overall Financial Resource Strain (CARDIA)    Difficulty of Paying Living Expenses: Not very hard  Food Insecurity:  No Food Insecurity (07/20/2023)   Hunger Vital Sign    Worried About Running Out of Food in the Last Year: Never true    Ran Out of Food in the Last Year: Never true  Transportation Needs: No Transportation Needs (07/20/2023)   PRAPARE - Administrator, Civil Service (Medical): No    Lack of Transportation (Non-Medical): No  Physical Activity: Not on file  Stress: Not on file  Social Connections: Socially Integrated (07/20/2023)   Social Connection and Isolation Panel    Frequency of Communication with Friends and Family: More than three times a week    Frequency of Social Gatherings with Friends and Family: More than three times a week    Attends Religious Services: More than 4 times per year    Active Member of [employer] or Organizations: Yes    Attends Banker Meetings: 1 to 4 times per year    Marital Status: Living with partner     Family History: The patient's family history includes Diabetes in his father; Stroke in his mother. ROS:   Please see the history of present illness.    All 14 point  review of systems negative except as described per history of present illness  EKGs/Labs/Other Studies Reviewed:         Recent Labs: 07/15/2023: Magnesium 1.9 07/25/2023: BUN 26; Creatinine, Ser 6.40; Hemoglobin 7.9; Platelets 396; Potassium 4.4; Sodium 133 08/22/2023: ALT 4  Recent Lipid Panel    Component Value Date/Time   CHOL 109 04/25/2018 0429   TRIG 73 04/25/2018 0429   HDL 34 (L) 04/25/2018 0429   CHOLHDL 3.2 04/25/2018 0429   VLDL 15 04/25/2018 0429   LDLCALC 60 04/25/2018 0429   LDLDIRECT 32 06/05/2019 1639    Physical Exam:    VS:  BP (!) 128/56   Pulse 64   Ht 5' 9 (1.753 m)   Wt 169 lb 4 oz (76.8 kg)   SpO2 96%   BMI 24.99 kg/m     Wt Readings from Last 3 Encounters:  12/12/23 169 lb 4 oz (76.8 kg)  09/24/23 173 lb 3.2 oz (78.6 kg)  09/10/23 174 lb 3.2 oz (79 kg)     GEN:  Well nourished, well developed in no acute distress HEENT: Normal NECK: No JVD; No carotid bruits LYMPHATICS: No lymphadenopathy CARDIAC: RRR, systolic ejection murmur grade 3/6 best heard right upper portion of sternum, no rubs, no gallops RESPIRATORY:  Clear to auscultation without rales, wheezing or rhonchi  ABDOMEN: Soft, non-tender, non-distended MUSCULOSKELETAL:  No edema; No deformity  SKIN: Warm and dry LOWER EXTREMITIES: no swelling NEUROLOGIC:  Alert and oriented x 3 PSYCHIATRIC:  Normal affect   ASSESSMENT:    1. Nonrheumatic aortic valve stenosis   2. Coronary artery disease involving native coronary artery of native heart without angina pectoris   3. Type 2 diabetes mellitus with chronic kidney disease on chronic dialysis, with long-term current use of insulin  (HCC)    PLAN:    In order of problems listed above:  Nonrheumatic aortic valve stenosis, no new symptoms.  Will schedule him to have another echocardiogram done in January, thinking is that probably he is valve is only moderate not severe yet on top of that he recently got MRSA sepsis, therefore we did  not rash to proceed with valve replacement I want him 1 more time about 3 classes of signs and symptoms people develop and they have severe aortic stenosis namely shortness of breath, chest  pain dizziness passing out and I told him if he develop this he need to let me know. Coronary disease stable denies have any chest pain tightness squeezing pressure in chest, type 2 diabetes followed by internal medicine team, last hemoglobin A1c from July is 5.3 continue present management. Dyslipidemia on statin LDL 33 HDL 26. Chronic kidney failure on dialysis   Medication Adjustments/Labs and Tests Ordered: Current medicines are reviewed at length with the patient today.  Concerns regarding medicines are outlined above.  No orders of the defined types were placed in this encounter.  Medication changes: No orders of the defined types were placed in this encounter.   Signed, Lamar DOROTHA Fitch, MD, [facility] 12/12/2023 10:31 AM    [facility]

## 2023-12-12 NOTE — Patient Instructions (Signed)
 Medication Instructions:  Your physician recommends that you continue on your current medications as directed. Please refer to the Current Medication list given to you today.  *If you need a refill on your cardiac medications before your next appointment, please call your pharmacy*   Lab Work: None Ordered If you have labs (blood work) drawn today and your tests are completely normal, you will receive your results only by: MyChart Message (if you have MyChart) OR A paper copy in the mail If you have any lab test that is abnormal or we need to change your treatment, we will call you to review the results.   Testing/Procedures: Jan Your physician has requested that you have an echocardiogram. Echocardiography is a painless test that uses sound waves to create images of your heart. It provides your doctor with information about the size and shape of your heart and how well your hearts chambers and valves are working. This procedure takes approximately one hour. There are no restrictions for this procedure. Please do NOT wear cologne, perfume, aftershave, or lotions (deodorant is allowed). Please arrive 15 minutes prior to your appointment time.  Please note: We ask at that you not bring children with you during ultrasound (echo/ vascular) testing. Due to room size and safety concerns, children are not allowed in the ultrasound rooms during exams. Our front office staff cannot provide observation of children in our lobby area while testing is being conducted. An adult accompanying a patient to their appointment will only be allowed in the ultrasound room at the discretion of the ultrasound technician under special circumstances. We apologize for any inconvenience.    Follow-Up: At [facility], you and your health needs are our priority.  As part of our continuing mission to provide you with exceptional heart care, we have created designated Provider Care Teams.  These Care Teams include your  primary Cardiologist (physician) and Advanced Practice Providers (APPs -  Physician Assistants and Nurse Practitioners) who all work together to provide you with the care you need, when you need it.  We recommend signing up for the patient portal called MyChart.  Sign up information is provided on this After Visit Summary.  MyChart is used to connect with patients for Virtual Visits (Telemedicine).  Patients are able to view lab/test results, encounter notes, upcoming appointments, etc.  Non-urgent messages can be sent to your provider as well.   To learn more about what you can do with MyChart, go to [email or web address].    Your next appointment:   3 month(s)  March 2026  The format for your next appointment:   In Person  Provider:   Lamar Fitch, MD    Other Instructions NA

## 2024-01-16 ENCOUNTER — Ambulatory Visit: Attending: Cardiology

## 2024-01-16 DIAGNOSIS — R0609 Other forms of dyspnea: Secondary | ICD-10-CM

## 2024-01-16 LAB — ECHOCARDIOGRAM COMPLETE
AR max vel: 0.88 cm2
AV Area VTI: 0.78 cm2
AV Area mean vel: 0.81 cm2
AV Mean grad: 33.2 mmHg
AV Peak grad: 54.5 mmHg
Ao pk vel: 3.69 m/s
Area-P 1/2: 4.15 cm2
MV M vel: 3.29 m/s
MV Peak grad: 43.3 mmHg
P 1/2 time: 381 ms
S' Lateral: 3.7 cm

## 2024-01-18 ENCOUNTER — Ambulatory Visit: Payer: Self-pay | Admitting: Cardiology

## 2024-01-21 ENCOUNTER — Encounter: Payer: Self-pay | Admitting: Cardiovascular Disease

## 2024-01-23 NOTE — Telephone Encounter (Signed)
 Patient read MyChart message regarding echo results on 01/21/24.  Alan, RN

## 2024-01-25 ENCOUNTER — Encounter: Payer: Self-pay | Admitting: Cardiology

## 2024-01-25 ENCOUNTER — Ambulatory Visit: Attending: Cardiology | Admitting: Cardiology

## 2024-01-25 VITALS — BP 144/60 | HR 66 | Ht 69.0 in | Wt 168.0 lb

## 2024-01-25 DIAGNOSIS — I2510 Atherosclerotic heart disease of native coronary artery without angina pectoris: Secondary | ICD-10-CM

## 2024-01-25 DIAGNOSIS — R112 Nausea with vomiting, unspecified: Secondary | ICD-10-CM

## 2024-01-25 DIAGNOSIS — I359 Nonrheumatic aortic valve disorder, unspecified: Secondary | ICD-10-CM

## 2024-01-25 DIAGNOSIS — I5022 Chronic systolic (congestive) heart failure: Secondary | ICD-10-CM | POA: Diagnosis not present

## 2024-01-25 DIAGNOSIS — E7800 Pure hypercholesterolemia, unspecified: Secondary | ICD-10-CM | POA: Diagnosis not present

## 2024-01-25 NOTE — Patient Instructions (Addendum)
 Medication Instructions:  Your physician recommends that you continue on your current medications as directed. Please refer to the Current Medication list given to you today.  *If you need a refill on your cardiac medications before your next appointment, please call your pharmacy*   Lab Work: None Ordered If you have labs (blood work) drawn today and your tests are completely normal, you will receive your results only by: MyChart Message (if you have MyChart) OR A paper copy in the mail If you have any lab test that is abnormal or we need to change your treatment, we will call you to review the results.   Testing/Procedures: 3 mo- prior to appt Your physician has requested that you have an echocardiogram. Echocardiography is a painless test that uses sound waves to create images of your heart. It provides your doctor with information about the size and shape of your heart and how well your hearts chambers and valves are working. This procedure takes approximately one hour. There are no restrictions for this procedure. Please do NOT wear cologne, perfume, aftershave, or lotions (deodorant is allowed). Please arrive 15 minutes prior to your appointment time.  Please note: We ask at that you not bring children with you during ultrasound (echo/ vascular) testing. Due to room size and safety concerns, children are not allowed in the ultrasound rooms during exams. Our front office staff cannot provide observation of children in our lobby area while testing is being conducted. An adult accompanying a patient to their appointment will only be allowed in the ultrasound room at the discretion of the ultrasound technician under special circumstances. We apologize for any inconvenience.    Follow-Up: At [facility], you and your health needs are our priority.  As part of our continuing mission to provide you with exceptional heart care, we have created designated Provider Care Teams.  These Care Teams  include your primary Cardiologist (physician) and Advanced Practice Providers (APPs -  Physician Assistants and Nurse Practitioners) who all work together to provide you with the care you need, when you need it.  We recommend signing up for the patient portal called MyChart.  Sign up information is provided on this After Visit Summary.  MyChart is used to connect with patients for Virtual Visits (Telemedicine).  Patients are able to view lab/test results, encounter notes, upcoming appointments, etc.  Non-urgent messages can be sent to your provider as well.   To learn more about what you can do with MyChart, go to [email or web address].    Your next appointment:   3 month(s)  The format for your next appointment:   In Person  Provider:   Lamar Fitch, MD    Other Instructions NA

## 2024-01-25 NOTE — Progress Notes (Signed)
 " Cardiology Office Note:    Date:  01/25/2024   ID:  Bobbi, Justin [name] [birth year], MRN [id number]  PCP:  Bernie Lamar PARAS, MD  Cardiologist:  Lamar Bernie, MD    Referring MD: Bernie Lamar PARAS, MD   Chief Complaint  Patient presents with   Follow-up  Doing fine  History of Present Illness:     Justin [name] is a 74 y.o. male  past medical history significant for end-stage kidney disease he is on dialysis he has been on dialysis for about 4 years, coronary artery disease cardiac catheterization done just recently show moderate stenosis of the LAD, also 70% of diagonal branch, 90% of small circumflex, 90% PDA. The reason for cardiac catheterization was also to assess his degree of aortic stenosis which appears to be moderate, what prompted investigation to begin with was the fact that he was potential candidate for kidney transplant. Since have seen him last time significant development happened. In July he ended up having hemorrhagic shock due to bleeding fistula from dialysis. He did have fistulogram and angioplasty done on however in the hospital he was also found to have MRSA positive in his blood. Additional issue left-sided epidural collection of fluid L3-L4 that required aspiration of the left lumbar paraspinal abscess on 718. At that time he got EGD done which showed no significant pathology and then had transesophageal echocardiogram done which was likely negative for endocarditis. Partial assessment of the aortic valve described as moderate. However the main reason for doing TEE was to rule out endocarditis. Family thinks that subsided he looks much better he feels much better. Denies have any pain tightness squeezing pressure burning chest. He comes here to talk about aortic stenosis which appears to be significant. Echocardiogram from July of this year showed calculated aortic valve area 0.8, dimension index 0.23, peak gradient 41 mean 23.8, stroke-volume index was 33. Heart rate  we dealing with low-flow low gradient significant aortic stenosis .  He did have echocardiogram done few days ago and parameters are slightly worse, aortic valve area 0.8 cm squire, dimensionless index 0.19, peak and mean gradient 54/33, stroke-volume index is 40. Asymptomatic denies have any chest pain tightness squeezing pressure mid chest no palpitation dizziness swelling of lower extremities the biggest complaint he got is the fact that he got diarrhea and vomiting.  No clear-cut explanation for this  Past Medical History:  Diagnosis Date   Acute postoperative anemia due to expected blood loss 02/23/2023   Anemia in chronic kidney disease 05/01/2018   Aortic valve disorder 05/07/2018   Bacteremia due to methicillin susceptible Staphylococcus aureus (MSSA) 07/19/2023   Bilateral carotid artery stenosis 06/06/2023   BPH (benign prostatic hyperplasia)    Chronic systolic CHF (congestive heart failure) (HCC) - now with recovered LVEF 04/24/2018   Closed comminuted intertrochanteric fracture of proximal end of femur with nonunion, right 02/16/2023   Coagulation defect, unspecified 05/01/2018   Coronary artery disease involving native coronary artery of native heart without angina pectoris 11/19/2019   LHC 09-29-2022     1.  Normal right heart hemodynamics with preserved cardiac output of 8.96 L/min and cardiac index of 4.5 L/min/m (likely high flow with hemodialysis/AV fistula)  2.  Moderate aortic stenosis with mean gradient 20 mmHg, calculated valve area 2.2 cm likely overestimated with high flow state.  Valve crossed with a J-wire.  3.  Patent left main with no stenosis  4.  Patent LAD with   Delirium due to multiple etiologies  02/23/2023   Dependence on renal dialysis 02/26/2023   Diabetes [facility])    Dysuria    Erectile dysfunction    Erosive esophagitis 06/06/2023   Esophagitis 02/25/2023   ESRD on hemodialysis [facility])    Fall 02/26/2023   Fever, unspecified 02/26/2023   Gastroesophageal  reflux disease with esophagitis without hemorrhage 07/27/2023   Gastroesophageal reflux disease without esophagitis 02/26/2023   Globus sensation 02/23/2023   Hardening of the aorta (main artery of the heart) 09/07/2023   Headache, unspecified 02/26/2023   Hemorrhagic shock (HCC) 07/13/2023   Hiccups 02/24/2023   History of chronic CHF 11/19/2019   Hyperlipemia    Hypertension    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) 06/06/2023   Hypertensive heart disease with congestive heart failure and stage 5 kidney disease (HCC) 04/24/2018   Hypoalbuminemia 09/07/2023   Insomnia due to medical condition 02/25/2023   Intraspinal abscess and granuloma 07/26/2023   Iron  deficiency anemia, unspecified 05/01/2018   Leukocytosis 09/07/2023   Long term (current) use of insulin  (HCC) 04/24/2018   Lower urinary tract symptoms due to benign prostatic hyperplasia 05/20/2014   Mild protein-calorie malnutrition 05/02/2018   Nonrheumatic aortic valve stenosis    Occlusion and stenosis of bilateral carotid arteries 09/07/2023   Other disorders of phosphorus metabolism 06/28/2018   Other fluid overload 02/26/2023   Pain, unspecified 05/01/2018   Personal history of COVID-19 11/19/2019   Pinna disorder, left    Pruritus, unspecified 05/01/2018   Pure hypercholesterolemia 02/26/2023   Renal disorder 10/18/2020   S/P ORIF (open reduction internal fixation) fracture - right intertrochanteric fracture 02/17/2023   Secondary hyperparathyroidism of renal origin 05/01/2018   Shortness of breath 05/01/2018   Type 2 diabetes mellitus with chronic kidney disease on chronic dialysis, with long-term current use of insulin  (HCC) 04/24/2018   Type 2 diabetes mellitus with hyperglycemia (HCC) 02/16/2023   Vitamin B12 deficiency 06/06/2023   Wears glasses     Past Surgical History:  Procedure Laterality Date   A/V FISTULAGRAM Left 07/16/2023   Procedure: A/V Fistulagram;  Surgeon: Serene Gaile ORN, MD;  Location: [facility] INV[facility]IVE [facility];  Service: Cardiovascular;  Laterality: Left;   A/V SHUNT INTERVENTION N/A 04/16/2023   Procedure: A/V SHUNT INTERVENTION;  Surgeon: Norine Manuelita LABOR, MD;  Location: [facility] INVAS[facility] CV [facility];  Service: Cardiovascular;  Laterality: N/A;   AV FISTULA PLACEMENT Left 04/30/2018   Procedure: CREATION OF RADIOCEPHALIC ARTERIOVENOUS FISTULA LEFT ARM;  Surgeon: Harvey Carlin BRAVO, MD;  Location: [facility] OR;  Service: Vascular;  Laterality: Left;   AV FISTULA PLACEMENT Left 08/28/2018   Procedure: ARTERIOVENOUS (AV) BRACHIOCEPHALIC FISTULA CREATION LEFT UPPER ARM;  Surgeon: Gretta Lonni PARAS, MD;  Location: [facility] OR;  Service: Vascular;  Laterality: Left;   BIOPSY  02/24/2023   Procedure: BIOPSY;  Surgeon: Federico Rosario BROCKS, MD;  Location: [facility] ENDOSCOPY;  Service: Gastroenterology;;   ESOPHAGOGASTRODUODENOSCOPY N/A 07/23/2023   Procedure: EGD (ESOPHAGOGASTRODUODENOSCOPY);  Surgeon: Nandigam, Kavitha V, MD;  Location: [facility] ENDOSCOPY;  Service: Gastroenterology;  Laterality: N/A;   ESOPHAGOGASTRODUODENOSCOPY (EGD) WITH PROPOFOL  N/A 02/24/2023   Procedure: ESOPHAGOGASTRODUODENOSCOPY (EGD) WITH PROPOFOL ;  Surgeon: Federico Rosario BROCKS, MD;  Location: [facility] ENDOSCOPY;  Service: Gastroenterology;  Laterality: N/A;  Patint is s/p right hip ORIF   INTRAMEDULLARY (IM) NAIL INTERTROCHANTERIC Right 02/17/2023   Procedure: INTRAMEDULLARY (IM) NAIL INTERTROCHANTERIC;  Surgeon: Sherida Adine BROCKS, MD;  Location: [facility] OR;  Service: Orthopedics;  Laterality: Right;   IR FLUORO GUIDE CV LINE RIGHT  04/29/2018  IR FLUORO GUIDED NEEDLE PLC ASPIRATION/INJECTION LOC  07/20/2023   IR US  GUIDE VASC ACCESS RIGHT  04/29/2018   RIGHT/LEFT HEART CATH AND CORONARY ANGIOGRAPHY N/A 05/01/2018   Procedure: RIGHT/LEFT HEART CATH AND CORONARY ANGIOGRAPHY;  Surgeon: Claudene Victory ORN, MD;  Location: [facility];  Service: Cardiovascular;  Laterality: N/A;   RIGHT/LEFT HEART CATH AND CORONARY ANGIOGRAPHY N/A 09/29/2022   Procedure:  RIGHT/LEFT HEART CATH AND CORONARY ANGIOGRAPHY;  Surgeon: Wonda Sharper, MD;  Location: [facility] INVASIVE CV LAB;  Service: Cardiovascular;  Laterality: N/A;   subdural hematoma Right 07/2020   TRANSESOPHAGEAL ECHOCARDIOGRAM (CATH LAB) N/A 07/25/2023   Procedure: TRANSESOPHAGEAL ECHOCARDIOGRAM;  Surgeon: Jeffrie Oneil BROCKS, MD;  Location: [facility];  Service: Cardiovascular;  Laterality: N/A;    Current Medications: Active Medications[1]   Allergies:   Patient has no known allergies.   Social History   Socioeconomic History   Marital status: Divorced    Spouse name: Not on file   Number of children: 1   Years of education: Not on file   Highest education level: Not on file  Occupational History   Not on file  Tobacco Use   Smoking status: Never   Smokeless tobacco: Never  Vaping Use   Vaping status: Never Used  Substance and Sexual Activity   Alcohol  use: Not Currently   Drug use: Never   Sexual activity: Not Currently  Other Topics Concern   Not on file  Social History Narrative   Not on file   Social Drivers of Health   Tobacco Use: Low Risk (01/25/2024)   Patient History    Smoking Tobacco Use: Never    Smokeless Tobacco Use: Never    Passive Exposure: Not on file  Financial Resource Strain: Not on file  Food Insecurity: No Food Insecurity (07/20/2023)   Epic    Worried About Programme Researcher, Broadcasting/film/video in the Last Year: Never true    Ran Out of Food in the Last Year: Never true  Transportation Needs: No Transportation Needs (07/20/2023)   Epic    Lack of Transportation (Medical): No    Lack of Transportation (Non-Medical): No  Physical Activity: Not on file  Stress: Not on file  Social Connections: Socially Integrated (07/20/2023)   Social Connection and Isolation Panel    Frequency of Communication with Friends and Family: More than three times a week    Frequency of Social Gatherings with Friends and Family: More than three times a week    Attends Religious  Services: More than 4 times per year    Active Member of [employer] or Organizations: Yes    Attends Banker Meetings: 1 to 4 times per year    Marital Status: Living with partner  Depression (PHQ2-9): Not on file  Alcohol  Screen: Not on file  Housing: Low Risk (07/20/2023)   Epic    Unable to Pay for Housing in the Last Year: No    Number of Times Moved in the Last Year: 0    Homeless in the Last Year: No  Utilities: Not At Risk (07/20/2023)   Epic    Threatened with loss of utilities: No  Health Literacy: Not on file     Family History: The patient's family history includes Diabetes in his father; Stroke in his mother. ROS:   Please see the history of present illness.    All 14 point review of systems negative except as described per history of present illness  EKGs/Labs/Other Studies  Reviewed:         Recent Labs: 07/15/2023: Magnesium 1.9 07/25/2023: BUN 26; Creatinine, Ser 6.40; Hemoglobin 7.9; Platelets 396; Potassium 4.4; Sodium 133 08/22/2023: ALT 4  Recent Lipid Panel    Component Value Date/Time   CHOL 109 04/25/2018 0429   TRIG 73 04/25/2018 0429   HDL 34 (L) 04/25/2018 0429   CHOLHDL 3.2 04/25/2018 0429   VLDL 15 04/25/2018 0429   LDLCALC 60 04/25/2018 0429   LDLDIRECT 32 06/05/2019 1639    Physical Exam:    VS:  BP (!) 144/60   Pulse 66   Ht 5' 9 (1.753 m)   Wt 168 lb (76.2 kg)   SpO2 97%   BMI 24.81 kg/m     Wt Readings from Last 3 Encounters:  01/25/24 168 lb (76.2 kg)  12/12/23 169 lb 4 oz (76.8 kg)  09/24/23 173 lb 3.2 oz (78.6 kg)     GEN:  Well nourished, well developed in no acute distress HEENT: Normal NECK: No JVD; No carotid bruits LYMPHATICS: No lymphadenopathy CARDIAC: RRR, systolic ejection murmur grade 3/6 to 4/6 posterior right upper portion sternal, S2 is very soft, no rubs, no gallops RESPIRATORY:  Clear to auscultation without rales, wheezing or rhonchi  ABDOMEN: Soft, non-tender, non-distended MUSCULOSKELETAL:  No  edema; No deformity  SKIN: Warm and dry LOWER EXTREMITIES: no swelling NEUROLOGIC:  Alert and oriented x 3 PSYCHIATRIC:  Normal affect   ASSESSMENT:    1. Aortic valve disorder   2. Chronic systolic CHF (congestive heart failure) (HCC) - now with recovered LVEF   3. Coronary artery disease involving native coronary artery of native heart without angina pectoris   4. Pure hypercholesterolemia    PLAN:    In order of problems listed above:  Aortic stenosis within severe range but he is completely asymptomatic was seen by our structural heart team and considered to be at high risk for potential procedure and since he is completely asymptomatic we will continue watching I did some worsening of his degree of stenosis, I will see him back in my office in about 3 months with repeated echocardiogram.  I want him 1 more time of signs and symptoms of critical arctic stenosis I will ask him to let me know if he develop those. Chronic diastolic congestive heart failure.  Stable from that point review continue present management. Dialysis.  Continue.   Medication Adjustments/Labs and Tests Ordered: Current medicines are reviewed at length with the patient today.  Concerns regarding medicines are outlined above.  No orders of the defined types were placed in this encounter.  Medication changes: No orders of the defined types were placed in this encounter.   Signed, Lamar DOROTHA Fitch, MD, [facility] 01/25/2024 8:57 AM    [facility]    [1]  No outpatient medications have been marked as taking for the 01/25/24 encounter (Office Visit) with Olsen Mccutchan J, MD.   "

## 2024-02-03 ENCOUNTER — Encounter: Payer: Self-pay | Admitting: Cardiology

## 2024-02-04 ENCOUNTER — Other Ambulatory Visit: Payer: Self-pay

## 2024-02-04 DIAGNOSIS — R197 Diarrhea, unspecified: Secondary | ICD-10-CM

## 2024-02-04 DIAGNOSIS — R112 Nausea with vomiting, unspecified: Secondary | ICD-10-CM

## 2024-03-12 ENCOUNTER — Ambulatory Visit: Admitting: Cardiology

## 2024-04-18 ENCOUNTER — Ambulatory Visit

## 2024-04-25 ENCOUNTER — Ambulatory Visit: Admitting: Cardiology
